# Patient Record
Sex: Male | Born: 1955
Health system: Southern US, Community
[De-identification: ages and names within clinical notes are randomized; demographics above are authoritative.]

## PROBLEM LIST (undated history)

## (undated) DIAGNOSIS — I10 Essential (primary) hypertension: Secondary | ICD-10-CM

## (undated) DIAGNOSIS — E7800 Pure hypercholesterolemia, unspecified: Secondary | ICD-10-CM

## (undated) DIAGNOSIS — I509 Heart failure, unspecified: Secondary | ICD-10-CM

## (undated) DIAGNOSIS — E789 Disorder of lipoprotein metabolism, unspecified: Secondary | ICD-10-CM

## (undated) DIAGNOSIS — I214 Non-ST elevation (NSTEMI) myocardial infarction: Secondary | ICD-10-CM

## (undated) DIAGNOSIS — I255 Ischemic cardiomyopathy: Secondary | ICD-10-CM

## (undated) DIAGNOSIS — R945 Abnormal results of liver function studies: Secondary | ICD-10-CM

## (undated) DIAGNOSIS — N189 Chronic kidney disease, unspecified: Secondary | ICD-10-CM

## (undated) DIAGNOSIS — Z951 Presence of aortocoronary bypass graft: Secondary | ICD-10-CM

## (undated) DIAGNOSIS — E1165 Type 2 diabetes mellitus with hyperglycemia: Secondary | ICD-10-CM

## (undated) DIAGNOSIS — Z9119 Patient's noncompliance with other medical treatment and regimen: Secondary | ICD-10-CM

## (undated) DIAGNOSIS — J449 Chronic obstructive pulmonary disease, unspecified: Secondary | ICD-10-CM

## (undated) DIAGNOSIS — I200 Unstable angina: Secondary | ICD-10-CM

## (undated) DIAGNOSIS — N200 Calculus of kidney: Secondary | ICD-10-CM

## (undated) DIAGNOSIS — I213 ST elevation (STEMI) myocardial infarction of unspecified site: Secondary | ICD-10-CM

## (undated) DIAGNOSIS — M109 Gout, unspecified: Secondary | ICD-10-CM

## (undated) DIAGNOSIS — I2510 Atherosclerotic heart disease of native coronary artery without angina pectoris: Secondary | ICD-10-CM

## (undated) HISTORY — DX: ST elevation (STEMI) myocardial infarction of unspecified site: I21.3

## (undated) HISTORY — PX: ROTATOR CUFF REPAIR: SHX139

## (undated) HISTORY — DX: Patient's noncompliance with other medical treatment and regimen: Z91.19

## (undated) HISTORY — PX: WRIST SURGERY: SHX841

## (undated) HISTORY — DX: Chronic obstructive pulmonary disease, unspecified: J44.9

## (undated) HISTORY — DX: Type 2 diabetes mellitus with hyperglycemia: E11.65

## (undated) HISTORY — PX: KNEE SURGERY: SHX244

---

## 2008-12-28 DIAGNOSIS — I2510 Atherosclerotic heart disease of native coronary artery without angina pectoris: Secondary | ICD-10-CM

## 2008-12-28 DIAGNOSIS — Z951 Presence of aortocoronary bypass graft: Secondary | ICD-10-CM

## 2008-12-28 DIAGNOSIS — I213 ST elevation (STEMI) myocardial infarction of unspecified site: Secondary | ICD-10-CM

## 2008-12-28 HISTORY — DX: Atherosclerotic heart disease of native coronary artery without angina pectoris: I25.10

## 2008-12-28 HISTORY — PX: CORONARY ARTERY BYPASS GRAFT: SHX141

## 2008-12-28 HISTORY — DX: ST elevation (STEMI) myocardial infarction of unspecified site: I21.3

## 2008-12-28 HISTORY — DX: Presence of aortocoronary bypass graft: Z95.1

## 2012-01-26 DIAGNOSIS — E789 Disorder of lipoprotein metabolism, unspecified: Secondary | ICD-10-CM

## 2012-01-26 DIAGNOSIS — I509 Heart failure, unspecified: Secondary | ICD-10-CM

## 2012-01-26 DIAGNOSIS — M109 Gout, unspecified: Secondary | ICD-10-CM

## 2012-01-26 DIAGNOSIS — Z87891 Personal history of nicotine dependence: Secondary | ICD-10-CM | POA: Insufficient documentation

## 2012-01-26 DIAGNOSIS — R7989 Other specified abnormal findings of blood chemistry: Secondary | ICD-10-CM

## 2012-01-26 DIAGNOSIS — I5023 Acute on chronic systolic (congestive) heart failure: Secondary | ICD-10-CM | POA: Insufficient documentation

## 2012-01-26 DIAGNOSIS — E1165 Type 2 diabetes mellitus with hyperglycemia: Secondary | ICD-10-CM

## 2012-01-26 DIAGNOSIS — IMO0002 Reserved for concepts with insufficient information to code with codable children: Secondary | ICD-10-CM

## 2012-01-26 DIAGNOSIS — K4090 Unilateral inguinal hernia, without obstruction or gangrene, not specified as recurrent: Secondary | ICD-10-CM | POA: Insufficient documentation

## 2012-01-26 HISTORY — DX: Type 2 diabetes mellitus with hyperglycemia: E11.65

## 2012-01-26 HISTORY — DX: Disorder of lipoprotein metabolism, unspecified: E78.9

## 2012-01-26 HISTORY — DX: Heart failure, unspecified: I50.9

## 2012-01-26 HISTORY — DX: Reserved for concepts with insufficient information to code with codable children: IMO0002

## 2012-01-26 HISTORY — DX: Other specified abnormal findings of blood chemistry: R79.89

## 2012-01-26 HISTORY — DX: Gout, unspecified: M10.9

## 2012-02-19 DIAGNOSIS — N189 Chronic kidney disease, unspecified: Secondary | ICD-10-CM

## 2012-02-19 HISTORY — DX: Chronic kidney disease, unspecified: N18.9

## 2012-06-22 DIAGNOSIS — G8929 Other chronic pain: Secondary | ICD-10-CM | POA: Insufficient documentation

## 2012-06-22 DIAGNOSIS — M2550 Pain in unspecified joint: Secondary | ICD-10-CM

## 2012-12-18 DIAGNOSIS — N486 Induration penis plastica: Secondary | ICD-10-CM | POA: Insufficient documentation

## 2013-01-24 DIAGNOSIS — N401 Enlarged prostate with lower urinary tract symptoms: Secondary | ICD-10-CM | POA: Insufficient documentation

## 2014-09-04 DIAGNOSIS — I10 Essential (primary) hypertension: Secondary | ICD-10-CM

## 2014-09-04 HISTORY — DX: Essential (primary) hypertension: I10

## 2014-11-14 DIAGNOSIS — I200 Unstable angina: Secondary | ICD-10-CM

## 2014-11-14 HISTORY — DX: Unstable angina: I20.0

## 2014-11-15 DIAGNOSIS — I255 Ischemic cardiomyopathy: Secondary | ICD-10-CM

## 2014-11-15 HISTORY — DX: Ischemic cardiomyopathy: I25.5

## 2015-01-09 DIAGNOSIS — I509 Heart failure, unspecified: Secondary | ICD-10-CM | POA: Diagnosis not present

## 2015-01-09 DIAGNOSIS — M5431 Sciatica, right side: Secondary | ICD-10-CM | POA: Diagnosis not present

## 2015-01-09 DIAGNOSIS — J45909 Unspecified asthma, uncomplicated: Secondary | ICD-10-CM | POA: Diagnosis not present

## 2015-01-31 DIAGNOSIS — J209 Acute bronchitis, unspecified: Secondary | ICD-10-CM | POA: Diagnosis not present

## 2015-01-31 DIAGNOSIS — F17210 Nicotine dependence, cigarettes, uncomplicated: Secondary | ICD-10-CM | POA: Diagnosis not present

## 2015-01-31 DIAGNOSIS — J40 Bronchitis, not specified as acute or chronic: Secondary | ICD-10-CM | POA: Diagnosis not present

## 2015-01-31 DIAGNOSIS — R05 Cough: Secondary | ICD-10-CM | POA: Diagnosis not present

## 2015-02-15 DIAGNOSIS — F17210 Nicotine dependence, cigarettes, uncomplicated: Secondary | ICD-10-CM | POA: Diagnosis not present

## 2015-02-15 DIAGNOSIS — I509 Heart failure, unspecified: Secondary | ICD-10-CM | POA: Diagnosis not present

## 2015-02-15 DIAGNOSIS — R0602 Shortness of breath: Secondary | ICD-10-CM | POA: Diagnosis not present

## 2015-02-15 DIAGNOSIS — R05 Cough: Secondary | ICD-10-CM | POA: Diagnosis not present

## 2015-02-15 DIAGNOSIS — R0981 Nasal congestion: Secondary | ICD-10-CM | POA: Diagnosis not present

## 2015-03-13 DIAGNOSIS — E119 Type 2 diabetes mellitus without complications: Secondary | ICD-10-CM | POA: Diagnosis not present

## 2015-03-13 DIAGNOSIS — J45909 Unspecified asthma, uncomplicated: Secondary | ICD-10-CM | POA: Diagnosis not present

## 2015-03-13 DIAGNOSIS — I2510 Atherosclerotic heart disease of native coronary artery without angina pectoris: Secondary | ICD-10-CM | POA: Diagnosis not present

## 2015-03-13 DIAGNOSIS — E785 Hyperlipidemia, unspecified: Secondary | ICD-10-CM | POA: Diagnosis not present

## 2015-05-02 DIAGNOSIS — R079 Chest pain, unspecified: Secondary | ICD-10-CM | POA: Diagnosis not present

## 2015-05-02 DIAGNOSIS — I309 Acute pericarditis, unspecified: Secondary | ICD-10-CM | POA: Diagnosis present

## 2015-05-02 DIAGNOSIS — I200 Unstable angina: Secondary | ICD-10-CM | POA: Diagnosis not present

## 2015-05-02 DIAGNOSIS — I213 ST elevation (STEMI) myocardial infarction of unspecified site: Secondary | ICD-10-CM | POA: Diagnosis not present

## 2015-05-02 DIAGNOSIS — R931 Abnormal findings on diagnostic imaging of heart and coronary circulation: Secondary | ICD-10-CM | POA: Diagnosis not present

## 2015-05-02 DIAGNOSIS — E119 Type 2 diabetes mellitus without complications: Secondary | ICD-10-CM | POA: Diagnosis present

## 2015-05-02 DIAGNOSIS — M1000 Idiopathic gout, unspecified site: Secondary | ICD-10-CM | POA: Diagnosis not present

## 2015-05-02 DIAGNOSIS — R918 Other nonspecific abnormal finding of lung field: Secondary | ICD-10-CM | POA: Diagnosis not present

## 2015-05-02 DIAGNOSIS — M10022 Idiopathic gout, left elbow: Secondary | ICD-10-CM | POA: Diagnosis present

## 2015-05-02 DIAGNOSIS — F17210 Nicotine dependence, cigarettes, uncomplicated: Secondary | ICD-10-CM | POA: Diagnosis not present

## 2015-05-02 DIAGNOSIS — I129 Hypertensive chronic kidney disease with stage 1 through stage 4 chronic kidney disease, or unspecified chronic kidney disease: Secondary | ICD-10-CM | POA: Diagnosis present

## 2015-05-02 DIAGNOSIS — I2510 Atherosclerotic heart disease of native coronary artery without angina pectoris: Secondary | ICD-10-CM | POA: Diagnosis present

## 2015-05-02 DIAGNOSIS — I10 Essential (primary) hypertension: Secondary | ICD-10-CM | POA: Diagnosis not present

## 2015-05-02 DIAGNOSIS — R072 Precordial pain: Secondary | ICD-10-CM | POA: Diagnosis not present

## 2015-05-02 DIAGNOSIS — M79602 Pain in left arm: Secondary | ICD-10-CM | POA: Diagnosis not present

## 2015-05-02 DIAGNOSIS — R9431 Abnormal electrocardiogram [ECG] [EKG]: Secondary | ICD-10-CM | POA: Diagnosis not present

## 2015-05-02 DIAGNOSIS — I25110 Atherosclerotic heart disease of native coronary artery with unstable angina pectoris: Secondary | ICD-10-CM | POA: Diagnosis not present

## 2015-05-02 DIAGNOSIS — E785 Hyperlipidemia, unspecified: Secondary | ICD-10-CM | POA: Diagnosis present

## 2015-05-02 DIAGNOSIS — I255 Ischemic cardiomyopathy: Secondary | ICD-10-CM | POA: Diagnosis not present

## 2015-05-02 DIAGNOSIS — N183 Chronic kidney disease, stage 3 (moderate): Secondary | ICD-10-CM | POA: Diagnosis present

## 2015-05-02 DIAGNOSIS — Z720 Tobacco use: Secondary | ICD-10-CM | POA: Diagnosis not present

## 2015-05-02 DIAGNOSIS — Z951 Presence of aortocoronary bypass graft: Secondary | ICD-10-CM | POA: Diagnosis not present

## 2015-05-05 DIAGNOSIS — I309 Acute pericarditis, unspecified: Secondary | ICD-10-CM | POA: Insufficient documentation

## 2015-05-20 DIAGNOSIS — M2550 Pain in unspecified joint: Secondary | ICD-10-CM | POA: Diagnosis not present

## 2015-05-20 DIAGNOSIS — I309 Acute pericarditis, unspecified: Secondary | ICD-10-CM | POA: Diagnosis not present

## 2015-05-20 DIAGNOSIS — M1A00X0 Idiopathic chronic gout, unspecified site, without tophus (tophi): Secondary | ICD-10-CM | POA: Diagnosis not present

## 2015-05-20 DIAGNOSIS — I10 Essential (primary) hypertension: Secondary | ICD-10-CM | POA: Diagnosis not present

## 2015-07-04 ENCOUNTER — Emergency Department (HOSPITAL_BASED_OUTPATIENT_CLINIC_OR_DEPARTMENT_OTHER)
Admission: EM | Admit: 2015-07-04 | Discharge: 2015-07-04 | Disposition: A | Discharge status: Home / Self Care | Payer: Medicare Other | Attending: Emergency Medicine | Admitting: Emergency Medicine

## 2015-07-04 ENCOUNTER — Emergency Department (HOSPITAL_BASED_OUTPATIENT_CLINIC_OR_DEPARTMENT_OTHER): Payer: Medicare Other

## 2015-07-04 ENCOUNTER — Encounter (HOSPITAL_BASED_OUTPATIENT_CLINIC_OR_DEPARTMENT_OTHER): Payer: Self-pay

## 2015-07-04 DIAGNOSIS — E119 Type 2 diabetes mellitus without complications: Secondary | ICD-10-CM | POA: Diagnosis not present

## 2015-07-04 DIAGNOSIS — IMO0001 Reserved for inherently not codable concepts without codable children: Secondary | ICD-10-CM

## 2015-07-04 DIAGNOSIS — Z87891 Personal history of nicotine dependence: Secondary | ICD-10-CM | POA: Diagnosis not present

## 2015-07-04 DIAGNOSIS — Z8709 Personal history of other diseases of the respiratory system: Secondary | ICD-10-CM | POA: Diagnosis not present

## 2015-07-04 DIAGNOSIS — I10 Essential (primary) hypertension: Secondary | ICD-10-CM | POA: Diagnosis not present

## 2015-07-04 DIAGNOSIS — M7989 Other specified soft tissue disorders: Secondary | ICD-10-CM | POA: Diagnosis not present

## 2015-07-04 DIAGNOSIS — Z7982 Long term (current) use of aspirin: Secondary | ICD-10-CM | POA: Insufficient documentation

## 2015-07-04 DIAGNOSIS — I252 Old myocardial infarction: Secondary | ICD-10-CM | POA: Insufficient documentation

## 2015-07-04 DIAGNOSIS — Z79899 Other long term (current) drug therapy: Secondary | ICD-10-CM | POA: Insufficient documentation

## 2015-07-04 DIAGNOSIS — Z791 Long term (current) use of non-steroidal anti-inflammatories (NSAID): Secondary | ICD-10-CM | POA: Diagnosis not present

## 2015-07-04 HISTORY — DX: Essential (primary) hypertension: I10

## 2015-07-04 HISTORY — DX: Gout, unspecified: M10.9

## 2015-07-04 MED ORDER — OXYCODONE-ACETAMINOPHEN 5-325 MG PO TABS
1.0000 | ORAL_TABLET | Freq: Once | ORAL | Status: AC
Start: 2015-07-04 — End: 2015-07-04
  Administered 2015-07-04: 1 via ORAL
  Filled 2015-07-04: qty 1

## 2015-07-04 MED ORDER — HYDROCODONE-ACETAMINOPHEN 5-325 MG PO TABS: 1.0000 | ORAL_TABLET | ORAL | Status: DC | PRN

## 2015-07-04 MED ORDER — INDOMETHACIN 25 MG PO CAPS: 25.0000 mg | ORAL_CAPSULE | Freq: Three times a day (TID) | ORAL | Status: DC | PRN

## 2015-07-04 NOTE — ED Notes (Signed)
Staff in room attempting ring removal with electric ring cutter.

## 2015-07-04 NOTE — ED Notes (Signed)
Pt with right middle finger swelling, ring noted to be on same.  Reports thinks it is gout b/c hx of same.

## 2015-07-04 NOTE — ED Provider Notes (Signed)
CSN: [id number]     Arrival date & time 07/04/15  1955 History   This chart was scribed for Sherwood Gambler, MD by Hilda Lias, ED Scribe. This patient was seen in room MH09/MH09 and the patient's care was started at 8:47 PM.  Chief Complaint  Patient presents with  . Joint Swelling      The history is provided by the patient. No language interpreter was used.   HPI Comments: Patrick Stewart is a 59 y.o. male with a hx of gout who presents to the Emergency Department complaining of constant 10/10 right middle finger pain with associated joint swelling that has been present for two days. Pt states it began to swell two days ago and notes that once it started to swell that he could not get his ring off of his finger. Pt states he has had gout in his fingers but not often.    Past Medical History  Diagnosis Date  . Gout   . Hypertension   . Diabetes mellitus without complication   . MI (myocardial infarction)   . Chronic bronchitis    Past Surgical History  Procedure Laterality Date  . Coronary artery bypass graft    . Knee surgery    . Rotator cuff repair    . Wrist surgery     No family history on file. History  Substance Use Topics  . Smoking status: Former Research scientist (life sciences)  . Smokeless tobacco: Not on file  . Alcohol Use: No    Review of Systems  Constitutional: Negative for fever.  Musculoskeletal: Positive for joint swelling and arthralgias.  Skin: Negative for color change.  All other systems reviewed and are negative.     Allergies  Review of patient's allergies indicates no known allergies.  Home Medications   Prior to Admission medications   Medication Sig Start Date End Date Taking? Authorizing Provider  albuterol (PROVENTIL) (2.5 MG/3ML) 0.083% nebulizer solution Take 2.5 mg by nebulization every 6 (six) hours as needed for wheezing or shortness of breath.   Yes Historical Provider, MD  aspirin 325 MG tablet Take 325 mg by mouth daily.   Yes Historical Provider, MD   atorvastatin (LIPITOR) 40 MG tablet Take 40 mg by mouth daily.   Yes Historical Provider, MD  doxazosin (CARDURA) 1 MG tablet Take 1 mg by mouth daily.   Yes Historical Provider, MD  lisinopril (PRINIVIL,ZESTRIL) 10 MG tablet Take 10 mg by mouth daily.   Yes Historical Provider, MD  metFORMIN (GLUCOPHAGE) 1000 MG tablet Take 1,000 mg by mouth 2 (two) times daily with a meal.   Yes Historical Provider, MD  naproxen (NAPROSYN) 500 MG tablet Take 500 mg by mouth 2 (two) times daily with a meal.   Yes Historical Provider, MD   BP 135/80 mmHg  Pulse 71  Temp(Src) 98.3 F (36.8 C) (Oral)  Resp 18  Ht 5\' 10"  (1.778 m)  Wt 192 lb (87.091 kg)  BMI 27.55 kg/m2  SpO2 94% Physical Exam  Constitutional: He is oriented to person, place, and time. He appears well-developed and well-nourished.  HENT:  Head: Normocephalic and atraumatic.  Right Ear: External ear normal.  Left Ear: External ear normal.  Nose: Nose normal.  Eyes: Right eye exhibits no discharge. Left eye exhibits no discharge.  Neck: Neck supple.  Pulmonary/Chest: Effort normal.  Abdominal: He exhibits no distension.  Musculoskeletal: He exhibits no edema.  Right middle finger PIP with diffuse swelling and decreased ROM No redness; mild warmth Normal capillary refill  Neurological: He is alert and oriented to person, place, and time.  Skin: Skin is warm and dry. No erythema.  Nursing note and vitals reviewed.   ED Course  Procedures (including critical care time)  DIAGNOSTIC STUDIES: Oxygen Saturation is 94% on room air, normal by my interpretation.    COORDINATION OF CARE: 8:48 PM Discussed treatment plan with pt at bedside and pt agreed to plan.   Labs Review Labs Reviewed - No data to display  Imaging Review Dg Finger Middle Right  07/04/2015   CLINICAL DATA:  Soft tissue swelling for several days  EXAM: RIGHT THIRD FINGER 2+V  COMPARISON:  None.  FINDINGS: Frontal, oblique, and lateral views were obtained. There  is focal soft tissue swelling in the region of the third PIP joint. There is no fracture or dislocation. Joint spaces appear intact. No erosive change or periostitis. No radiopaque foreign body.  IMPRESSION: Soft tissue swelling at the level of the third PIP joint. No bony or joint space abnormality appreciable.   Electronically Signed   By: Lowella Grip III M.D.   On: 07/04/2015 21:59     EKG Interpretation None      MDM   Final diagnoses:  Swelling of third finger of right hand    Patient with significant PIP swelling, previous extensive history of gout and he feels this is same. Joint is swollen but no redness. Mildly limited ROM but I feel this is more likely gout, unlikely to be septic arthritis. Patient's ring proximal to this was unable to be removed manually and patient chose to have tech cut it off. Will treat pain, he requests indomethacin which has worked many times in the past. Recommend close f/u with PCP  I personally performed the services described in this documentation, which was scribed in my presence. The recorded information has been reviewed and is accurate.    Sherwood Gambler, MD 07/05/15 [phone]

## 2015-07-13 ENCOUNTER — Encounter (HOSPITAL_BASED_OUTPATIENT_CLINIC_OR_DEPARTMENT_OTHER): Payer: Self-pay

## 2015-07-13 ENCOUNTER — Emergency Department (HOSPITAL_BASED_OUTPATIENT_CLINIC_OR_DEPARTMENT_OTHER): Payer: Medicare Other

## 2015-07-13 ENCOUNTER — Emergency Department (HOSPITAL_BASED_OUTPATIENT_CLINIC_OR_DEPARTMENT_OTHER)
Admission: EM | Admit: 2015-07-13 | Discharge: 2015-07-13 | Disposition: A | Discharge status: Home / Self Care | Payer: Medicare Other | Attending: Emergency Medicine | Admitting: Emergency Medicine

## 2015-07-13 DIAGNOSIS — I252 Old myocardial infarction: Secondary | ICD-10-CM | POA: Insufficient documentation

## 2015-07-13 DIAGNOSIS — Z8739 Personal history of other diseases of the musculoskeletal system and connective tissue: Secondary | ICD-10-CM | POA: Insufficient documentation

## 2015-07-13 DIAGNOSIS — Z87891 Personal history of nicotine dependence: Secondary | ICD-10-CM | POA: Diagnosis not present

## 2015-07-13 DIAGNOSIS — Z791 Long term (current) use of non-steroidal anti-inflammatories (NSAID): Secondary | ICD-10-CM | POA: Diagnosis not present

## 2015-07-13 DIAGNOSIS — J189 Pneumonia, unspecified organism: Secondary | ICD-10-CM

## 2015-07-13 DIAGNOSIS — E119 Type 2 diabetes mellitus without complications: Secondary | ICD-10-CM | POA: Diagnosis not present

## 2015-07-13 DIAGNOSIS — R079 Chest pain, unspecified: Secondary | ICD-10-CM | POA: Diagnosis not present

## 2015-07-13 DIAGNOSIS — I10 Essential (primary) hypertension: Secondary | ICD-10-CM | POA: Diagnosis not present

## 2015-07-13 DIAGNOSIS — J159 Unspecified bacterial pneumonia: Secondary | ICD-10-CM | POA: Insufficient documentation

## 2015-07-13 DIAGNOSIS — R05 Cough: Secondary | ICD-10-CM | POA: Diagnosis present

## 2015-07-13 DIAGNOSIS — Z7982 Long term (current) use of aspirin: Secondary | ICD-10-CM | POA: Diagnosis not present

## 2015-07-13 DIAGNOSIS — J40 Bronchitis, not specified as acute or chronic: Secondary | ICD-10-CM | POA: Diagnosis not present

## 2015-07-13 DIAGNOSIS — Z951 Presence of aortocoronary bypass graft: Secondary | ICD-10-CM | POA: Diagnosis not present

## 2015-07-13 DIAGNOSIS — Z79899 Other long term (current) drug therapy: Secondary | ICD-10-CM | POA: Insufficient documentation

## 2015-07-13 LAB — CBC WITH DIFFERENTIAL/PLATELET
Basophils Absolute: 0 10*3/uL (ref 0.0–0.1)
Basophils Relative: 1 % (ref 0–1)
EOS PCT: 6 % — AB (ref 0–5)
Eosinophils Absolute: 0.2 10*3/uL (ref 0.0–0.7)
HCT: 35.4 % — ABNORMAL LOW (ref 39.0–52.0)
HEMOGLOBIN: 11.4 g/dL — AB (ref 13.0–17.0)
LYMPHS PCT: 23 % (ref 12–46)
Lymphs Abs: 0.9 10*3/uL (ref 0.7–4.0)
MCH: 30.6 pg (ref 26.0–34.0)
MCHC: 32.2 g/dL (ref 30.0–36.0)
MCV: 95.2 fL (ref 78.0–100.0)
Monocytes Absolute: 0.7 10*3/uL (ref 0.1–1.0)
Monocytes Relative: 17 % — ABNORMAL HIGH (ref 3–12)
NEUTROS PCT: 55 % (ref 43–77)
Neutro Abs: 2.2 10*3/uL (ref 1.7–7.7)
PLATELETS: 156 10*3/uL (ref 150–400)
RBC: 3.72 MIL/uL — AB (ref 4.22–5.81)
RDW: 14.3 % (ref 11.5–15.5)
WBC: 4 10*3/uL (ref 4.0–10.5)

## 2015-07-13 LAB — BASIC METABOLIC PANEL
Anion gap: 8 (ref 5–15)
BUN: 21 mg/dL — ABNORMAL HIGH (ref 6–20)
CO2: 25 mmol/L (ref 22–32)
Calcium: 8.6 mg/dL — ABNORMAL LOW (ref 8.9–10.3)
Chloride: 106 mmol/L (ref 101–111)
Creatinine, Ser: 1.39 mg/dL — ABNORMAL HIGH (ref 0.61–1.24)
GFR calc Af Amer: >60 mL/min (ref >60)
GFR calc non Af Amer: 54 mL/min — ABNORMAL LOW (ref >60)
Glucose, Bld: 123 mg/dL — ABNORMAL HIGH (ref 65–99)
POTASSIUM: 4.1 mmol/L (ref 3.5–5.1)
SODIUM: 139 mmol/L (ref 135–145)

## 2015-07-13 LAB — CBG MONITORING, ED: Glucose-Capillary: 156 mg/dL — ABNORMAL HIGH (ref 65–99)

## 2015-07-13 MED ORDER — SODIUM CHLORIDE 0.9 % IV BOLUS (SEPSIS)
500.0000 mL | Freq: Once | INTRAVENOUS | Status: AC
  Administered 2015-07-13: 500 mL via INTRAVENOUS

## 2015-07-13 MED ORDER — IPRATROPIUM-ALBUTEROL 0.5-2.5 (3) MG/3ML IN SOLN
3.0000 mL | Freq: Four times a day (QID) | RESPIRATORY_TRACT | Status: DC
  Administered 2015-07-13: 3 mL via RESPIRATORY_TRACT
  Filled 2015-07-13: qty 3

## 2015-07-13 MED ORDER — ALBUTEROL SULFATE HFA 108 (90 BASE) MCG/ACT IN AERS
1.0000 | INHALATION_SPRAY | Freq: Four times a day (QID) | RESPIRATORY_TRACT | Status: DC | PRN
Start: 2015-07-13 — End: 2016-01-29

## 2015-07-13 MED ORDER — AZITHROMYCIN 250 MG PO TABS: 250.0000 mg | ORAL_TABLET | Freq: Every day | ORAL | Status: DC

## 2015-07-13 MED ORDER — DM-GUAIFENESIN ER 30-600 MG PO TB12: 1.0000 | ORAL_TABLET | Freq: Two times a day (BID) | ORAL | Status: DC

## 2015-07-13 MED ORDER — CEFTRIAXONE SODIUM 1 G IJ SOLR
INTRAMUSCULAR | Status: AC
  Filled 2015-07-13: qty 10

## 2015-07-13 MED ORDER — DEXTROSE 5 % IV SOLN
1.0000 g | Freq: Once | INTRAVENOUS | Status: AC
  Administered 2015-07-13: 1 g via INTRAVENOUS

## 2015-07-13 MED ORDER — SODIUM CHLORIDE 0.9 % IV SOLN: INTRAVENOUS | Status: DC

## 2015-07-13 NOTE — ED Notes (Signed)
Patient here with dry cough and congestion x 1 day

## 2015-07-13 NOTE — ED Provider Notes (Signed)
CSN: [id number]     Arrival date & time 07/13/15  1028 History   First MD Initiated Contact with Patient 07/13/15 1042     Chief Complaint  Patient presents with  . Cough     (Consider location/radiation/quality/duration/timing/severity/associated sxs/prior Treatment) Patient is a 59 y.o. male presenting with cough. The history is provided by the patient.  Cough Associated symptoms: chest pain and shortness of breath   Associated symptoms: no fever, no headaches and no rash    patient presents with a nonproductive cough that started yesterday. Associated with congestion no true wheezing. Patient does have albuterol inhaler that has been using at home. Patient states that he gets bronchitis frequently. Patient has chest soreness due to all the coughing chest pain is only with the cough. No fevers. No nausea vomiting or diarrhea.  Past Medical History  Diagnosis Date  . Gout   . Hypertension   . Diabetes mellitus without complication   . MI (myocardial infarction)   . Chronic bronchitis    Past Surgical History  Procedure Laterality Date  . Coronary artery bypass graft    . Knee surgery    . Rotator cuff repair    . Wrist surgery     No family history on file. History  Substance Use Topics  . Smoking status: Former Research scientist (life sciences)  . Smokeless tobacco: Not on file  . Alcohol Use: No    Review of Systems  Constitutional: Negative for fever.  HENT: Positive for congestion.   Eyes: Negative for redness.  Respiratory: Positive for cough and shortness of breath.   Cardiovascular: Positive for chest pain.  Gastrointestinal: Negative for nausea, vomiting and abdominal pain.  Genitourinary: Negative for hematuria.  Musculoskeletal: Negative for back pain.  Skin: Negative for rash.  Neurological: Negative for headaches.  Hematological: Does not bruise/bleed easily.  Psychiatric/Behavioral: Negative for confusion.      Allergies  Review of patient's allergies indicates no known  allergies.  Home Medications   Prior to Admission medications   Medication Sig Start Date End Date Taking? Authorizing Provider  albuterol (PROVENTIL HFA;VENTOLIN HFA) 108 (90 BASE) MCG/ACT inhaler Inhale 1-2 puffs into the lungs every 6 (six) hours as needed for wheezing or shortness of breath. 07/13/15   [name], MD  albuterol (PROVENTIL) (2.5 MG/3ML) 0.083% nebulizer solution Take 2.5 mg by nebulization every 6 (six) hours as needed for wheezing or shortness of breath.    Historical Provider, MD  aspirin 325 MG tablet Take 325 mg by mouth daily.    Historical Provider, MD  atorvastatin (LIPITOR) 40 MG tablet Take 40 mg by mouth daily.    Historical Provider, MD  azithromycin (ZITHROMAX) 250 MG tablet Take 1 tablet (250 mg total) by mouth daily. Take first 2 tablets together, then 1 every day until finished. 07/13/15   [name], MD  dextromethorphan-guaiFENesin [facility] DM) 30-600 MG per 12 hr tablet Take 1 tablet by mouth 2 (two) times daily. 07/13/15   [name], MD  doxazosin (CARDURA) 1 MG tablet Take 1 mg by mouth daily.    Historical Provider, MD  HYDROcodone-acetaminophen (NORCO) 5-325 MG per tablet Take 1-2 tablets by mouth every 4 (four) hours as needed for severe pain. 07/04/15   Sherwood Gambler, MD  indomethacin (INDOCIN) 25 MG capsule Take 1 capsule (25 mg total) by mouth 3 (three) times daily as needed. 07/04/15   Sherwood Gambler, MD  lisinopril (PRINIVIL,ZESTRIL) 10 MG tablet Take 10 mg by mouth daily.    Historical Provider,  MD  metFORMIN (GLUCOPHAGE) 1000 MG tablet Take 1,000 mg by mouth 2 (two) times daily with a meal.    Historical Provider, MD  naproxen (NAPROSYN) 500 MG tablet Take 500 mg by mouth 2 (two) times daily with a meal.    Historical Provider, MD   BP 181/98 mmHg  Pulse 80  Temp(Src) 98.2 F (36.8 C) (Oral)  Resp 18  Ht 5\' 10"  (1.778 m)  Wt 192 lb (87.091 kg)  BMI 27.55 kg/m2  SpO2 99% Physical Exam  Constitutional: He is oriented to person,  place, and time. He appears well-developed and well-nourished. No distress.  HENT:  Head: Normocephalic and atraumatic.  Mouth/Throat: Oropharynx is clear and moist.  Eyes: Conjunctivae and EOM are normal. Pupils are equal, round, and reactive to light.  Neck: Normal range of motion.  Cardiovascular: Normal rate, regular rhythm and normal heart sounds.   Pulmonary/Chest: Effort normal and breath sounds normal. No respiratory distress. He has no wheezes.  Abdominal: Soft. Bowel sounds are normal. There is no tenderness.  Musculoskeletal: Normal range of motion. He exhibits no edema.  Neurological: He is alert and oriented to person, place, and time. No cranial nerve deficit. He exhibits normal muscle tone. Coordination normal.  Skin: Skin is warm. No rash noted.    ED Course  Procedures (including critical care time) Labs Review Labs Reviewed  CBC WITH DIFFERENTIAL/PLATELET - Abnormal; Notable for the following:    RBC 3.72 (*)    Hemoglobin 11.4 (*)    HCT 35.4 (*)    Monocytes Relative 17 (*)    Eosinophils Relative 6 (*)    All other components within normal limits  BASIC METABOLIC PANEL - Abnormal; Notable for the following:    Glucose, Bld 123 (*)    BUN 21 (*)    Creatinine, Ser 1.39 (*)    Calcium 8.6 (*)    GFR calc non Af Amer 54 (*)    All other components within normal limits  CBG MONITORING, ED - Abnormal; Notable for the following:    Glucose-Capillary 156 (*)    All other components within normal limits   Results for orders placed or performed during the hospital encounter of 07/13/15  CBC with Differential/Platelet  Result Value Ref Range   WBC 4.0 4.0 - 10.5 K/uL   RBC 3.72 (L) 4.22 - 5.81 MIL/uL   Hemoglobin 11.4 (L) 13.0 - 17.0 g/dL   HCT 35.4 (L) 39.0 - 52.0 %   MCV 95.2 78.0 - 100.0 fL   MCH 30.6 26.0 - 34.0 pg   MCHC 32.2 30.0 - 36.0 g/dL   RDW 14.3 11.5 - 15.5 %   Platelets 156 150 - 400 K/uL   Neutrophils Relative % 55 43 - 77 %   Neutro Abs 2.2  1.7 - 7.7 K/uL   Lymphocytes Relative 23 12 - 46 %   Lymphs Abs 0.9 0.7 - 4.0 K/uL   Monocytes Relative 17 (H) 3 - 12 %   Monocytes Absolute 0.7 0.1 - 1.0 K/uL   Eosinophils Relative 6 (H) 0 - 5 %   Eosinophils Absolute 0.2 0.0 - 0.7 K/uL   Basophils Relative 1 0 - 1 %   Basophils Absolute 0.0 0.0 - 0.1 K/uL  Basic metabolic panel  Result Value Ref Range   Sodium 139 135 - 145 mmol/L   Potassium 4.1 3.5 - 5.1 mmol/L   Chloride 106 101 - 111 mmol/L   CO2 25 22 - 32 mmol/L  Glucose, Bld 123 (H) 65 - 99 mg/dL   BUN 21 (H) 6 - 20 mg/dL   Creatinine, Ser 1.39 (H) 0.61 - 1.24 mg/dL   Calcium 8.6 (L) 8.9 - 10.3 mg/dL   GFR calc non Af Amer 54 (L) >60 mL/min   GFR calc Af Amer >60 >60 mL/min   Anion gap 8 5 - 15  CBG monitoring, ED  Result Value Ref Range   Glucose-Capillary 156 (H) 65 - 99 mg/dL     Imaging Review Dg Chest 2 View  07/13/2015   CLINICAL DATA:  Cough, congestion, cold symptoms  EXAM: CHEST  2 VIEW  COMPARISON:  None.  FINDINGS: Borderline cardiomegaly. Status post CABG. Streaky mild interstitial prominence right perihilar and right infrahilar region. Bronchitic changes or atypical pneumonitis cannot be excluded. No pulmonary edema.  IMPRESSION: Streaky mild interstitial prominence right perihilar and right infrahilar region. Bronchitic changes or atypical pneumonitis cannot be excluded. No pulmonary edema.   Electronically Signed   By: Lahoma Crocker M.D.   On: 07/13/2015 11:12     EKG Interpretation None      MDM   Final diagnoses:  CAP (community acquired pneumonia)  Bronchitis    Chest x-ray suggestive of early pneumonia. Patient's symptoms more consistent with just straight bronchitis. No leukocytosis. However will treat for community-acquired pneumonia. Patient was hospitalized at [facility] in the beginning of April so that would've been more than 90 days ago. Also treated with albuterol inhaler although no wheezing here and Mucinex DM. Nontoxic no acute distress. No  hypoxia.    [name], MD 07/13/15 [phone]

## 2015-07-13 NOTE — Discharge Instructions (Signed)
Chest x-ray suggestive of early pneumonia. Take anabolic as directed. Use albuterol inhaler of the 1 you have at home 2 puffs every 6 hours for the next 7 days. Take Mucinex DM as needed for cough. Return for any new or worse symptoms or if not improving in the 2 days.

## 2015-07-17 ENCOUNTER — Emergency Department (HOSPITAL_COMMUNITY)
Admission: EM | Admit: 2015-07-17 | Discharge: 2015-07-17 | Disposition: A | Discharge status: Home / Self Care | Payer: Medicare Other | Attending: Emergency Medicine | Admitting: Emergency Medicine

## 2015-07-17 ENCOUNTER — Emergency Department (HOSPITAL_COMMUNITY): Payer: Medicare Other

## 2015-07-17 ENCOUNTER — Encounter (HOSPITAL_COMMUNITY): Payer: Self-pay | Admitting: Emergency Medicine

## 2015-07-17 DIAGNOSIS — Z951 Presence of aortocoronary bypass graft: Secondary | ICD-10-CM | POA: Insufficient documentation

## 2015-07-17 DIAGNOSIS — I10 Essential (primary) hypertension: Secondary | ICD-10-CM | POA: Diagnosis not present

## 2015-07-17 DIAGNOSIS — Z7982 Long term (current) use of aspirin: Secondary | ICD-10-CM | POA: Diagnosis not present

## 2015-07-17 DIAGNOSIS — M109 Gout, unspecified: Secondary | ICD-10-CM | POA: Diagnosis not present

## 2015-07-17 DIAGNOSIS — J40 Bronchitis, not specified as acute or chronic: Secondary | ICD-10-CM | POA: Diagnosis not present

## 2015-07-17 DIAGNOSIS — I252 Old myocardial infarction: Secondary | ICD-10-CM | POA: Diagnosis not present

## 2015-07-17 DIAGNOSIS — Z87891 Personal history of nicotine dependence: Secondary | ICD-10-CM | POA: Insufficient documentation

## 2015-07-17 DIAGNOSIS — R05 Cough: Secondary | ICD-10-CM | POA: Diagnosis not present

## 2015-07-17 DIAGNOSIS — Z791 Long term (current) use of non-steroidal anti-inflammatories (NSAID): Secondary | ICD-10-CM | POA: Insufficient documentation

## 2015-07-17 DIAGNOSIS — J209 Acute bronchitis, unspecified: Secondary | ICD-10-CM | POA: Diagnosis not present

## 2015-07-17 DIAGNOSIS — R509 Fever, unspecified: Secondary | ICD-10-CM | POA: Diagnosis not present

## 2015-07-17 DIAGNOSIS — Z79899 Other long term (current) drug therapy: Secondary | ICD-10-CM | POA: Insufficient documentation

## 2015-07-17 DIAGNOSIS — E119 Type 2 diabetes mellitus without complications: Secondary | ICD-10-CM | POA: Diagnosis not present

## 2015-07-17 LAB — COMPREHENSIVE METABOLIC PANEL
ALK PHOS: 50 U/L (ref 38–126)
ALT: 28 U/L (ref 17–63)
AST: 65 U/L — AB (ref 15–41)
Albumin: 3.6 g/dL (ref 3.5–5.0)
Anion gap: 11 (ref 5–15)
BUN: 26 mg/dL — ABNORMAL HIGH (ref 6–20)
CO2: 22 mmol/L (ref 22–32)
CREATININE: 1.58 mg/dL — AB (ref 0.61–1.24)
Calcium: 8.4 mg/dL — ABNORMAL LOW (ref 8.9–10.3)
Chloride: 101 mmol/L (ref 101–111)
GFR calc non Af Amer: 46 mL/min — ABNORMAL LOW (ref >60)
GFR, EST AFRICAN AMERICAN: 54 mL/min — AB (ref >60)
Glucose, Bld: 105 mg/dL — ABNORMAL HIGH (ref 65–99)
Potassium: 3.9 mmol/L (ref 3.5–5.1)
SODIUM: 134 mmol/L — AB (ref 135–145)
TOTAL PROTEIN: 8.3 g/dL — AB (ref 6.5–8.1)
Total Bilirubin: 1 mg/dL (ref 0.3–1.2)

## 2015-07-17 LAB — URINE MICROSCOPIC-ADD ON

## 2015-07-17 LAB — I-STAT CHEM 8, ED
BUN: 29 mg/dL — AB (ref 6–20)
CALCIUM ION: 1.07 mmol/L — AB (ref 1.12–1.23)
Chloride: 101 mmol/L (ref 101–111)
Creatinine, Ser: 1.6 mg/dL — ABNORMAL HIGH (ref 0.61–1.24)
Glucose, Bld: 109 mg/dL — ABNORMAL HIGH (ref 65–99)
HEMATOCRIT: 37 % — AB (ref 39.0–52.0)
HEMOGLOBIN: 12.6 g/dL — AB (ref 13.0–17.0)
POTASSIUM: 3.8 mmol/L (ref 3.5–5.1)
Sodium: 136 mmol/L (ref 135–145)
TCO2: 22 mmol/L (ref 0–100)

## 2015-07-17 LAB — CBC WITH DIFFERENTIAL/PLATELET
BASOS PCT: 1 % (ref 0–1)
Basophils Absolute: 0 10*3/uL (ref 0.0–0.1)
EOS ABS: 0.1 10*3/uL (ref 0.0–0.7)
EOS PCT: 2 % (ref 0–5)
HEMATOCRIT: 33.8 % — AB (ref 39.0–52.0)
HEMOGLOBIN: 11 g/dL — AB (ref 13.0–17.0)
Lymphocytes Relative: 23 % (ref 12–46)
Lymphs Abs: 1 10*3/uL (ref 0.7–4.0)
MCH: 30.4 pg (ref 26.0–34.0)
MCHC: 32.5 g/dL (ref 30.0–36.0)
MCV: 93.4 fL (ref 78.0–100.0)
MONO ABS: 0.9 10*3/uL (ref 0.1–1.0)
MONOS PCT: 21 % — AB (ref 3–12)
Neutro Abs: 2.3 10*3/uL (ref 1.7–7.7)
Neutrophils Relative %: 53 % (ref 43–77)
Platelets: 171 10*3/uL (ref 150–400)
RBC: 3.62 MIL/uL — ABNORMAL LOW (ref 4.22–5.81)
RDW: 14.3 % (ref 11.5–15.5)
WBC: 4.3 10*3/uL (ref 4.0–10.5)

## 2015-07-17 LAB — URINALYSIS, ROUTINE W REFLEX MICROSCOPIC
Bilirubin Urine: NEGATIVE
Glucose, UA: NEGATIVE mg/dL
KETONES UR: NEGATIVE mg/dL
Leukocytes, UA: NEGATIVE
Nitrite: NEGATIVE
Protein, ur: >300 mg/dL — AB
Specific Gravity, Urine: 1.024 (ref 1.005–1.030)
Urobilinogen, UA: 1 mg/dL (ref 0.0–1.0)
pH: 5.5 (ref 5.0–8.0)

## 2015-07-17 LAB — I-STAT CG4 LACTIC ACID, ED: LACTIC ACID, VENOUS: 1.26 mmol/L (ref 0.5–2.0)

## 2015-07-17 MED ORDER — BENZONATATE 100 MG PO CAPS: 100.0000 mg | ORAL_CAPSULE | Freq: Three times a day (TID) | ORAL | Status: DC

## 2015-07-17 MED ORDER — ALBUTEROL SULFATE (2.5 MG/3ML) 0.083% IN NEBU
5.0000 mg | INHALATION_SOLUTION | Freq: Once | RESPIRATORY_TRACT | Status: AC
  Administered 2015-07-17: 5 mg via RESPIRATORY_TRACT
  Filled 2015-07-17: qty 6

## 2015-07-17 MED ORDER — AMOXICILLIN-POT CLAVULANATE 875-125 MG PO TABS
1.0000 | ORAL_TABLET | Freq: Once | ORAL | Status: AC
  Administered 2015-07-17: 1 via ORAL
  Filled 2015-07-17: qty 1

## 2015-07-17 MED ORDER — ALBUTEROL SULFATE HFA 108 (90 BASE) MCG/ACT IN AERS: 1.0000 | INHALATION_SPRAY | Freq: Four times a day (QID) | RESPIRATORY_TRACT | Status: DC | PRN

## 2015-07-17 MED ORDER — PREDNISONE 20 MG PO TABS: ORAL_TABLET | ORAL | Status: DC

## 2015-07-17 MED ORDER — NAPROXEN 500 MG PO TABS: 500.0000 mg | ORAL_TABLET | Freq: Two times a day (BID) | ORAL | Status: DC

## 2015-07-17 MED ORDER — ACETAMINOPHEN 325 MG PO TABS
650.0000 mg | ORAL_TABLET | Freq: Once | ORAL | Status: AC | PRN
  Administered 2015-07-17: 650 mg via ORAL
  Filled 2015-07-17: qty 2

## 2015-07-17 MED ORDER — PREDNISONE 20 MG PO TABS
60.0000 mg | ORAL_TABLET | Freq: Once | ORAL | Status: AC
  Administered 2015-07-17: 60 mg via ORAL
  Filled 2015-07-17: qty 3

## 2015-07-17 MED ORDER — KETOROLAC TROMETHAMINE 30 MG/ML IJ SOLN
30.0000 mg | Freq: Once | INTRAMUSCULAR | Status: AC
  Administered 2015-07-17: 30 mg via INTRAVENOUS
  Filled 2015-07-17: qty 1

## 2015-07-17 MED ORDER — IBUPROFEN 800 MG PO TABS
800.0000 mg | ORAL_TABLET | Freq: Once | ORAL | Status: AC
Start: 2015-07-17 — End: 2015-07-17
  Administered 2015-07-17: 800 mg via ORAL
  Filled 2015-07-17: qty 1

## 2015-07-17 MED ORDER — AMOXICILLIN-POT CLAVULANATE 875-125 MG PO TABS: 1.0000 | ORAL_TABLET | Freq: Two times a day (BID) | ORAL | Status: DC

## 2015-07-17 NOTE — Care Management (Signed)
ED CM received a call concerning medication assistance for tessalon pearls prescription, patient referred to [facility] where the cost is $10, patient appreciative for the assistance. No other CM need identified.

## 2015-07-17 NOTE — Discharge Instructions (Signed)
How to Use an Inhaler Using your inhaler correctly is very important. Good technique will make sure that the medicine reaches your lungs.  HOW TO USE AN INHALER: 1. Take the cap off the inhaler. 2. If this is the first time using your inhaler, you need to prime it. Shake the inhaler for 5 seconds. Release four puffs into the air, away from your face. Ask your doctor for help if you have questions. 3. Shake the inhaler for 5 seconds. 4. Turn the inhaler so the bottle is above the mouthpiece. 5. Put your pointer finger on top of the bottle. Your thumb holds the bottom of the inhaler. 6. Open your mouth. 7. Either hold the inhaler away from your mouth (the width of 2 fingers) or place your lips tightly around the mouthpiece. Ask your doctor which way to use your inhaler. 8. Breathe out as much air as possible. 9. Breathe in and push down on the bottle 1 time to release the medicine. You will feel the medicine go in your mouth and throat. 10. Continue to take a deep breath in very slowly. Try to fill your lungs. 11. After you have breathed in completely, hold your breath for 10 seconds. This will help the medicine to settle in your lungs. If you cannot hold your breath for 10 seconds, hold it for as long as you can before you breathe out. 12. Breathe out slowly, through pursed lips. Whistling is an example of pursed lips. 13. If your doctor has told you to take more than 1 puff, wait at least 15-30 seconds between puffs. This will help you get the best results from your medicine. Do not use the inhaler more than your doctor tells you to. 14. Put the cap back on the inhaler. 15. Follow the directions from your doctor or from the inhaler package about cleaning the inhaler. If you use more than one inhaler, ask your doctor which inhalers to use and what order to use them in. Ask your doctor to help you figure out when you will need to refill your inhaler.  If you use a steroid inhaler, always rinse your  mouth with water after your last puff, gargle and spit out the water. Do not swallow the water. GET HELP IF:  The inhaler medicine only partially helps to stop wheezing or shortness of breath.  You are having trouble using your inhaler.  You have some increase in thick spit (phlegm). GET HELP RIGHT AWAY IF:  The inhaler medicine does not help your wheezing or shortness of breath or you have tightness in your chest.  You have dizziness, headaches, or fast heart rate.  You have chills, fever, or night sweats.  You have a large increase of thick spit, or your thick spit is bloody. MAKE SURE YOU:   Understand these instructions.  Will watch your condition.  Will get help right away if you are not doing well or get worse. Document Released: 09/22/2008 Document Revised: 10/04/2013 Document Reviewed: 07/13/2013 [facility] Patient Information 2015 [place], [place]. This information is not intended to replace advice given to you by your health care provider. Make sure you discuss any questions you have with your health care provider.

## 2015-07-17 NOTE — ED Notes (Signed)
Patient is feeling worse. He was in hospital 7/16 and was diagnosed with pneumonia. Patient has taken the zpak.

## 2015-07-17 NOTE — ED Notes (Signed)
Bed: [id number] Expected date: 07/17/15 Expected time: 4:20 AM Means of arrival: Ambulance Comments: Known PNA

## 2015-07-17 NOTE — ED Provider Notes (Signed)
CSN: [id number]     Arrival date & time 07/17/15  [id number] History   First MD Initiated Contact with Patient 07/17/15 0444     Chief Complaint  Patient presents with  . Fever    Patient is feeling worse. He was in hospital 7/16 and was diagnosed with pneumonia. Patient has taken the zpak.      (Consider location/radiation/quality/duration/timing/severity/associated sxs/prior Treatment) Patient is a 59 y.o. male presenting with fever. The history is provided by the patient.  Fever Temp source:  Oral Severity:  Moderate Onset quality:  Gradual Timing:  Constant Progression:  Unchanged Chronicity:  New Relieved by:  Nothing Worsened by:  Nothing tried Ineffective treatments:  None tried Associated symptoms: cough   Associated symptoms: no chest pain, no chills, no confusion, no ear pain, no myalgias, no rash, no rhinorrhea and no sore throat   Risk factors: no hx of cancer     Past Medical History  Diagnosis Date  . Gout   . Hypertension   . Diabetes mellitus without complication   . MI (myocardial infarction)   . Chronic bronchitis    Past Surgical History  Procedure Laterality Date  . Coronary artery bypass graft    . Knee surgery    . Rotator cuff repair    . Wrist surgery     History reviewed. No pertinent family history. History  Substance Use Topics  . Smoking status: Former Research scientist (life sciences)  . Smokeless tobacco: Not on file  . Alcohol Use: No    Review of Systems  Constitutional: Positive for fever. Negative for chills.  HENT: Negative for ear pain, rhinorrhea and sore throat.   Eyes: Negative for photophobia.  Respiratory: Positive for cough and wheezing. Negative for shortness of breath.   Cardiovascular: Negative for chest pain, palpitations and leg swelling.  Genitourinary: Negative for flank pain.  Musculoskeletal: Negative for myalgias and neck pain.  Skin: Negative for rash.  Psychiatric/Behavioral: Negative for confusion.  All other systems reviewed and are  negative.     Allergies  Review of patient's allergies indicates no known allergies.  Home Medications   Prior to Admission medications   Medication Sig Start Date End Date Taking? Authorizing Provider  albuterol (PROVENTIL HFA;VENTOLIN HFA) 108 (90 BASE) MCG/ACT inhaler Inhale 1-2 puffs into the lungs every 6 (six) hours as needed for wheezing or shortness of breath. 07/13/15  Yes [name], MD  albuterol (PROVENTIL) (2.5 MG/3ML) 0.083% nebulizer solution Take 2.5 mg by nebulization every 6 (six) hours as needed for wheezing or shortness of breath.   Yes Historical Provider, MD  aspirin 325 MG tablet Take 325 mg by mouth daily.   Yes Historical Provider, MD  atorvastatin (LIPITOR) 40 MG tablet Take 40 mg by mouth daily.   Yes Historical Provider, MD  azithromycin (ZITHROMAX) 250 MG tablet Take 1 tablet (250 mg total) by mouth daily. Take first 2 tablets together, then 1 every day until finished. 07/13/15  Yes [name], MD  dextromethorphan-guaiFENesin [facility] DM) 30-600 MG per 12 hr tablet Take 1 tablet by mouth 2 (two) times daily. 07/13/15  Yes [name], MD  doxazosin (CARDURA) 1 MG tablet Take 1 mg by mouth daily.   Yes Historical Provider, MD  HYDROcodone-acetaminophen (NORCO) 5-325 MG per tablet Take 1-2 tablets by mouth every 4 (four) hours as needed for severe pain. 07/04/15  Yes Sherwood Gambler, MD  lisinopril (PRINIVIL,ZESTRIL) 10 MG tablet Take 10 mg by mouth daily.   Yes Historical Provider, MD  metFORMIN (  GLUCOPHAGE) 1000 MG tablet Take 1,000 mg by mouth 2 (two) times daily with a meal.   Yes Historical Provider, MD  indomethacin (INDOCIN) 25 MG capsule Take 1 capsule (25 mg total) by mouth 3 (three) times daily as needed. 07/04/15   Sherwood Gambler, MD  naproxen (NAPROSYN) 500 MG tablet Take 500 mg by mouth 2 (two) times daily with a meal.    Historical Provider, MD   BP 159/96 mmHg  Pulse 80  Temp(Src) 102.7 F (39.3 C) (Oral)  Resp 18  Ht 5\' 10"  (1.778 m)   Wt 191 lb (86.637 kg)  BMI 27.41 kg/m2  SpO2 93% Physical Exam  Constitutional: He is oriented to person, place, and time. He appears well-developed and well-nourished. No distress.  HENT:  Head: Normocephalic and atraumatic.  Mouth/Throat: Oropharynx is clear and moist. No oropharyngeal exudate.  Eyes: Conjunctivae and EOM are normal. Pupils are equal, round, and reactive to light.  Neck: Normal range of motion. Neck supple.  Cardiovascular: Normal rate, regular rhythm and intact distal pulses.   Pulmonary/Chest: No respiratory distress. He has wheezes. He has no rales. He exhibits no tenderness.  Abdominal: Soft. Bowel sounds are normal. There is no tenderness. There is no rebound and no guarding.  Musculoskeletal: Normal range of motion. He exhibits no edema or tenderness.  Neurological: He is alert and oriented to person, place, and time. He has normal reflexes.  Skin: Skin is warm and dry.  Psychiatric: He has a normal mood and affect.    ED Course  Procedures (including critical care time) Labs Review Labs Reviewed  COMPREHENSIVE METABOLIC PANEL - Abnormal; Notable for the following:    Sodium 134 (*)    Glucose, Bld 105 (*)    BUN 26 (*)    Creatinine, Ser 1.58 (*)    Calcium 8.4 (*)    Total Protein 8.3 (*)    AST 65 (*)    GFR calc non Af Amer 46 (*)    GFR calc Af Amer 54 (*)    All other components within normal limits  URINALYSIS, ROUTINE W REFLEX MICROSCOPIC (NOT AT [facility]) - Abnormal; Notable for the following:    Hgb urine dipstick SMALL (*)    Protein, ur >300 (*)    All other components within normal limits  CBC WITH DIFFERENTIAL/PLATELET - Abnormal; Notable for the following:    RBC 3.62 (*)    Hemoglobin 11.0 (*)    HCT 33.8 (*)    Monocytes Relative 21 (*)    All other components within normal limits  I-STAT CHEM 8, ED - Abnormal; Notable for the following:    BUN 29 (*)    Creatinine, Ser 1.60 (*)    Glucose, Bld 109 (*)    Calcium, Ion 1.07 (*)     Hemoglobin 12.6 (*)    HCT 37.0 (*)    All other components within normal limits  CULTURE, BLOOD (ROUTINE X 2)  CULTURE, BLOOD (ROUTINE X 2)  URINE CULTURE  URINE MICROSCOPIC-ADD ON  I-STAT CG4 LACTIC ACID, ED    Imaging Review Dg Chest 2 View  07/17/2015   CLINICAL DATA:  Acute onset of cough, congestion and fever. Initial encounter.  EXAM: CHEST  2 VIEW  COMPARISON:  Chest radiograph from 07/13/2015  FINDINGS: The lungs are well-aerated. Vascular congestion is noted. Peribronchial thickening is seen. There is no evidence of focal opacification, pleural effusion or pneumothorax.  The heart is mildly enlarged. The patient is status post median sternotomy.  No acute osseous abnormalities are seen. The patient is status post rotator cuff repair on the right side.  IMPRESSION: Vascular congestion and mild cardiomegaly. Peribronchial thickening seen.   Electronically Signed   By: Garald Balding M.D.   On: 07/17/2015 06:24     EKG Interpretation None      MDM   Final diagnoses:  None    No PNA on CXR.  No hypotension nor tachycardia. Defervesced.  Will start augmentin BID with short course of steroids and NSAIDs for muscle pain.  Also tessalon.  Follow up with your PMD in 2 days.  Strict return precautions   Zinia Innocent, MD 07/17/15 [phone]

## 2015-07-17 NOTE — ED Notes (Signed)
Pt escorted to discharge window. Pt verbalized understanding discharge instructions. In no acute distress.  

## 2015-07-17 NOTE — ED Notes (Signed)
Requested patient to urinate. 

## 2015-07-18 LAB — URINE CULTURE: Culture: NO GROWTH

## 2015-07-22 LAB — CULTURE, BLOOD (ROUTINE X 2)
Culture: NO GROWTH
Culture: NO GROWTH

## 2015-09-14 ENCOUNTER — Emergency Department (HOSPITAL_COMMUNITY)
Admission: EM | Admit: 2015-09-14 | Discharge: 2015-09-14 | Disposition: A | Discharge status: Home / Self Care | Payer: Medicare Other | Attending: Physician Assistant | Admitting: Physician Assistant

## 2015-09-14 ENCOUNTER — Encounter (HOSPITAL_COMMUNITY): Payer: Self-pay

## 2015-09-14 ENCOUNTER — Emergency Department (HOSPITAL_COMMUNITY): Payer: Medicare Other

## 2015-09-14 DIAGNOSIS — Z8709 Personal history of other diseases of the respiratory system: Secondary | ICD-10-CM | POA: Insufficient documentation

## 2015-09-14 DIAGNOSIS — R2241 Localized swelling, mass and lump, right lower limb: Secondary | ICD-10-CM | POA: Diagnosis present

## 2015-09-14 DIAGNOSIS — R6883 Chills (without fever): Secondary | ICD-10-CM | POA: Diagnosis not present

## 2015-09-14 DIAGNOSIS — M25461 Effusion, right knee: Secondary | ICD-10-CM | POA: Diagnosis not present

## 2015-09-14 DIAGNOSIS — I252 Old myocardial infarction: Secondary | ICD-10-CM | POA: Insufficient documentation

## 2015-09-14 DIAGNOSIS — M10061 Idiopathic gout, right knee: Secondary | ICD-10-CM | POA: Insufficient documentation

## 2015-09-14 DIAGNOSIS — Z7982 Long term (current) use of aspirin: Secondary | ICD-10-CM | POA: Insufficient documentation

## 2015-09-14 DIAGNOSIS — I10 Essential (primary) hypertension: Secondary | ICD-10-CM | POA: Insufficient documentation

## 2015-09-14 DIAGNOSIS — M109 Gout, unspecified: Secondary | ICD-10-CM | POA: Diagnosis not present

## 2015-09-14 DIAGNOSIS — Z79899 Other long term (current) drug therapy: Secondary | ICD-10-CM | POA: Insufficient documentation

## 2015-09-14 DIAGNOSIS — Z87891 Personal history of nicotine dependence: Secondary | ICD-10-CM | POA: Insufficient documentation

## 2015-09-14 DIAGNOSIS — Z791 Long term (current) use of non-steroidal anti-inflammatories (NSAID): Secondary | ICD-10-CM | POA: Diagnosis not present

## 2015-09-14 DIAGNOSIS — R509 Fever, unspecified: Secondary | ICD-10-CM | POA: Diagnosis not present

## 2015-09-14 DIAGNOSIS — E119 Type 2 diabetes mellitus without complications: Secondary | ICD-10-CM | POA: Diagnosis not present

## 2015-09-14 DIAGNOSIS — Z792 Long term (current) use of antibiotics: Secondary | ICD-10-CM | POA: Insufficient documentation

## 2015-09-14 LAB — C-REACTIVE PROTEIN: CRP: 10.5 mg/dL — ABNORMAL HIGH (ref <1.0)

## 2015-09-14 LAB — CBC WITH DIFFERENTIAL/PLATELET
BASOS ABS: 0 10*3/uL (ref 0.0–0.1)
Basophils Relative: 0 %
EOS PCT: 3 %
Eosinophils Absolute: 0.2 10*3/uL (ref 0.0–0.7)
HEMATOCRIT: 38.2 % — AB (ref 39.0–52.0)
Hemoglobin: 12.7 g/dL — ABNORMAL LOW (ref 13.0–17.0)
LYMPHS PCT: 9 %
Lymphs Abs: 0.7 10*3/uL (ref 0.7–4.0)
MCH: 30.1 pg (ref 26.0–34.0)
MCHC: 33.2 g/dL (ref 30.0–36.0)
MCV: 90.5 fL (ref 78.0–100.0)
MONO ABS: 0.8 10*3/uL (ref 0.1–1.0)
MONOS PCT: 11 %
Neutro Abs: 6 10*3/uL (ref 1.7–7.7)
Neutrophils Relative %: 77 %
Platelets: 207 10*3/uL (ref 150–400)
RBC: 4.22 MIL/uL (ref 4.22–5.81)
RDW: 14.5 % (ref 11.5–15.5)
WBC: 7.7 10*3/uL (ref 4.0–10.5)

## 2015-09-14 LAB — SYNOVIAL CELL COUNT + DIFF, W/ CRYSTALS
EOSINOPHILS-SYNOVIAL: 0 % (ref 0–1)
Lymphocytes-Synovial Fld: 1 % (ref 0–20)
Monocyte-Macrophage-Synovial Fluid: 0 % — ABNORMAL LOW (ref 50–90)
NEUTROPHIL, SYNOVIAL: 99 % — AB (ref 0–25)
WBC, Synovial: 12794 /mm3 — ABNORMAL HIGH (ref 0–200)

## 2015-09-14 LAB — SEDIMENTATION RATE: SED RATE: 68 mm/h — AB (ref 0–16)

## 2015-09-14 LAB — CBG MONITORING, ED: Glucose-Capillary: 89 mg/dL (ref 65–99)

## 2015-09-14 LAB — URIC ACID: Uric Acid, Serum: 10.3 mg/dL — ABNORMAL HIGH (ref 4.4–7.6)

## 2015-09-14 LAB — BASIC METABOLIC PANEL
ANION GAP: 6 (ref 5–15)
BUN: 25 mg/dL — AB (ref 6–20)
CALCIUM: 9.1 mg/dL (ref 8.9–10.3)
CO2: 27 mmol/L (ref 22–32)
Chloride: 103 mmol/L (ref 101–111)
Creatinine, Ser: 1.58 mg/dL — ABNORMAL HIGH (ref 0.61–1.24)
GFR calc Af Amer: 54 mL/min — ABNORMAL LOW (ref >60)
GFR calc non Af Amer: 46 mL/min — ABNORMAL LOW (ref >60)
Glucose, Bld: 208 mg/dL — ABNORMAL HIGH (ref 65–99)
Potassium: 4.1 mmol/L (ref 3.5–5.1)
Sodium: 136 mmol/L (ref 135–145)

## 2015-09-14 LAB — I-STAT CG4 LACTIC ACID, ED: Lactic Acid, Venous: 1.5 mmol/L (ref 0.5–2.0)

## 2015-09-14 MED ORDER — OXYCODONE-ACETAMINOPHEN 5-325 MG PO TABS
2.0000 | ORAL_TABLET | Freq: Once | ORAL | Status: AC
  Administered 2015-09-14: 2 via ORAL
  Filled 2015-09-14: qty 2

## 2015-09-14 MED ORDER — IBUPROFEN 800 MG PO TABS
800.0000 mg | ORAL_TABLET | Freq: Once | ORAL | Status: AC
  Administered 2015-09-14: 800 mg via ORAL
  Filled 2015-09-14: qty 1

## 2015-09-14 MED ORDER — BUPIVACAINE HCL (PF) 0.5 % IJ SOLN
20.0000 mL | Freq: Once | INTRAMUSCULAR | Status: AC
  Administered 2015-09-14: 20 mL
  Filled 2015-09-14: qty 30

## 2015-09-14 MED ORDER — SODIUM CHLORIDE 0.9 % IV BOLUS (SEPSIS)
1000.0000 mL | Freq: Once | INTRAVENOUS | Status: AC
  Administered 2015-09-14: 1000 mL via INTRAVENOUS

## 2015-09-14 MED ORDER — OXYCODONE-ACETAMINOPHEN 5-325 MG PO TABS: 1.0000 | ORAL_TABLET | ORAL | Status: DC | PRN

## 2015-09-14 MED ORDER — IBUPROFEN 800 MG PO TABS
800.0000 mg | ORAL_TABLET | Freq: Once | ORAL | Status: DC
  Filled 2015-09-14: qty 1

## 2015-09-14 MED ORDER — INDOMETHACIN 25 MG PO CAPS: 25.0000 mg | ORAL_CAPSULE | Freq: Three times a day (TID) | ORAL | Status: DC | PRN

## 2015-09-14 NOTE — ED Provider Notes (Signed)
CSN: [id number]     Arrival date & time 09/14/15  1159 History  This chart was scribed for non-physician practitioner, Patrick Bible, PA-C working with Patrick Schwartz, MD by Evelene Croon, ED Scribe. This patient was seen in room WTR6/WTR6 and the patient's care was started at 12:30 PM.  Chief Complaint  Patient presents with  . Joint Swelling   The history is provided by the patient. No language interpreter was used.     HPI Comments:  Patrick Stewart is a 59 y.o. male with a history of gout, who presents to the Emergency Department complaining of 10/10 pain and swelling of the right knee for 3 days that has progressively worsened since onset.  Pt reports a history of same pain in his right knee due to his gout. He deneis fever, chills, nausea, vomiting and injury to the knee. However, he has a temp of 102.4 F orally upon arrival in the ED. He also denies recent ingestion of red meat and alcohol. He states in the past he has taken colchicine and indomethacin for his gout with relief but states he recently moved and at this time does not have a PCP to follow up with . No alleviating factors noted.     Past Medical History  Diagnosis Date  . Gout   . Hypertension   . Diabetes mellitus without complication   . MI (myocardial infarction)   . Chronic bronchitis    Past Surgical History  Procedure Laterality Date  . Coronary artery bypass graft    . Knee surgery    . Rotator cuff repair    . Wrist surgery     No family history on file. Social History  Substance Use Topics  . Smoking status: Former Research scientist (life sciences)  . Smokeless tobacco: None  . Alcohol Use: No    Review of Systems  Constitutional: Negative for fever and chills.  Musculoskeletal: Positive for joint swelling and arthralgias.  Skin: Negative for wound.  All other systems reviewed and are negative.   Allergies  Hydrocodone  Home Medications   Prior to Admission medications   Medication Sig Start Date End Date Taking?  Authorizing Provider  albuterol (PROVENTIL HFA;VENTOLIN HFA) 108 (90 BASE) MCG/ACT inhaler Inhale 1-2 puffs into the lungs every 6 (six) hours as needed for wheezing or shortness of breath. 07/13/15   [name], MD  albuterol (PROVENTIL HFA;VENTOLIN HFA) 108 (90 BASE) MCG/ACT inhaler Inhale 1-2 puffs into the lungs every 6 (six) hours as needed for wheezing or shortness of breath. 07/17/15   [name], MD  albuterol (PROVENTIL) (2.5 MG/3ML) 0.083% nebulizer solution Take 2.5 mg by nebulization every 6 (six) hours as needed for wheezing or shortness of breath.    Historical Provider, MD  amoxicillin-clavulanate (AUGMENTIN) 875-125 MG per tablet Take 1 tablet by mouth 2 (two) times daily. One po bid x 7 days 07/17/15   [name], MD  aspirin 325 MG tablet Take 325 mg by mouth daily.    Historical Provider, MD  atorvastatin (LIPITOR) 40 MG tablet Take 40 mg by mouth daily.    Historical Provider, MD  benzonatate (TESSALON) 100 MG capsule Take 1 capsule (100 mg total) by mouth every 8 (eight) hours. 07/17/15   [name], MD  dextromethorphan-guaiFENesin [facility] DM) 30-600 MG per 12 hr tablet Take 1 tablet by mouth 2 (two) times daily. 07/13/15   [name], MD  doxazosin (CARDURA) 1 MG tablet Take 1 mg by mouth daily.    Historical Provider, MD  HYDROcodone-acetaminophen (NORCO) 5-325 MG per tablet Take 1-2 tablets by mouth every 4 (four) hours as needed for severe pain. 07/04/15   Sherwood Gambler, MD  indomethacin (INDOCIN) 25 MG capsule Take 1 capsule (25 mg total) by mouth 3 (three) times daily as needed. 07/04/15   Sherwood Gambler, MD  lisinopril (PRINIVIL,ZESTRIL) 10 MG tablet Take 10 mg by mouth daily.    Historical Provider, MD  metFORMIN (GLUCOPHAGE) 1000 MG tablet Take 1,000 mg by mouth 2 (two) times daily with a meal.    Historical Provider, MD  naproxen (NAPROSYN) 500 MG tablet Take 500 mg by mouth 2 (two) times daily with a meal.    Historical Provider, MD  naproxen (NAPROSYN) 500  MG tablet Take 1 tablet (500 mg total) by mouth 2 (two) times daily. 07/17/15   [name], MD  predniSONE (DELTASONE) 20 MG tablet 3 tabs po day one, then 2 po daily x 4 days 07/17/15   [name], MD   There were no vitals taken for this visit. Physical Exam  Constitutional: He appears well-developed and well-nourished. No distress.  HENT:  Head: Normocephalic and atraumatic.  Eyes: Conjunctivae are normal.  Neck: Normal range of motion.  Cardiovascular: Normal rate and regular rhythm.   Pulmonary/Chest: Effort normal and breath sounds normal.  Musculoskeletal: He exhibits edema.  Diffuse swelling of the right knee  Mild erythema and warmth of thee right knee  ROM limited, most likely secondary to pain 2+ DP pulse on the right and distal sensation on the right foot is intact   Neurological: He is alert.  Skin: Skin is warm and dry.  Nursing note and vitals reviewed.   ED Course  Procedures   DIAGNOSTIC STUDIES:  Oxygen Saturation is 97% on RA, normal by my interpretation.    COORDINATION OF CARE:  12:36 PM Discussed treatment plan with pt at bedside and pt agreed to plan.  Labs Review Labs Reviewed - No data to display  Imaging Review Dg Knee Complete 4 Views Right  09/14/2015   CLINICAL DATA:  History of gout.  Right knee pain.  EXAM: RIGHT KNEE - COMPLETE 4+ VIEW  COMPARISON:  None.  FINDINGS: There is no acute fracture or dislocation. There is mild tricompartmental osteoarthritis of the right knee. There is a large joint effusion. There is chondrocalcinosis of the medial and lateral femorotibial compartments as can be seen with CPPD.  IMPRESSION: 1. No acute osseous injury of the right knee. 2. Large joint effusion. 3. Mild tricompartmental osteoarthritis of the right knee.   Electronically Signed   By: Kathreen Devoid   On: 09/14/2015 14:02   I have personally reviewed and evaluated these images and lab results as part of my medical decision-making.   EKG  Interpretation None      MDM   Final diagnoses:  None  Patient with a history of Gout presents today with left knee pain and was found to have a fever of 102.4 F orally in the ED.  Patient with limited ROM, most likely secondary to pain.  Due to fever, concern for Septic Arthritis.  Patient moved out of FT and moved to the acute part of the ED and will need to have arthrocentesis to rule out septic joint.  Arthrocentesis will be performed by next provider.  I personally performed the services described in this documentation, which was scribed in my presence. The recorded information has been reviewed and is accurate.    Patrick Bible, PA-C 09/14/15 2219  Patrick Schwartz, MD 09/15/15 [phone]

## 2015-09-14 NOTE — ED Notes (Signed)
He c/o swelling and pain of right knee which he recognizes as gout pain.

## 2015-09-14 NOTE — Discharge Instructions (Signed)
Gout Gout is an inflammatory arthritis caused by a buildup of uric acid crystals in the joints. Uric acid is a chemical that is normally present in the blood. When the level of uric acid in the blood is too high it can form crystals that deposit in your joints and tissues. This causes joint redness, soreness, and swelling (inflammation). Repeat attacks are common. Over time, uric acid crystals can form into masses (tophi) near a joint, destroying bone and causing disfigurement. Gout is treatable and often preventable. CAUSES  The disease begins with elevated levels of uric acid in the blood. Uric acid is produced by your body when it breaks down a naturally found substance called purines. Certain foods you eat, such as meats and fish, contain high amounts of purines. Causes of an elevated uric acid level include:  Being passed down from parent to child (heredity).  Diseases that cause increased uric acid production (such as obesity, psoriasis, and certain cancers).  Excessive alcohol use.  Diet, especially diets rich in meat and seafood.  Medicines, including certain cancer-fighting medicines (chemotherapy), water pills (diuretics), and aspirin.  Chronic kidney disease. The kidneys are no longer able to remove uric acid well.  Problems with metabolism. Conditions strongly associated with gout include:  Obesity.  High blood pressure.  High cholesterol.  Diabetes. Not everyone with elevated uric acid levels gets gout. It is not understood why some people get gout and others do not. Surgery, joint injury, and eating too much of certain foods are some of the factors that can lead to gout attacks. SYMPTOMS   An attack of gout comes on quickly. It causes intense pain with redness, swelling, and warmth in a joint.  Fever can occur.  Often, only one joint is involved. Certain joints are more commonly involved:  Base of the big toe.  Knee.  Ankle.  Wrist.  Finger. Without  treatment, an attack usually goes away in a few days to weeks. Between attacks, you usually will not have symptoms, which is different from many other forms of arthritis. DIAGNOSIS  Your caregiver will suspect gout based on your symptoms and exam. In some cases, tests may be recommended. The tests may include:  Blood tests.  Urine tests.  X-rays.  Joint fluid exam. This exam requires a needle to remove fluid from the joint (arthrocentesis). Using a microscope, gout is confirmed when uric acid crystals are seen in the joint fluid. TREATMENT  There are two phases to gout treatment: treating the sudden onset (acute) attack and preventing attacks (prophylaxis).  Treatment of an Acute Attack.  Medicines are used. These include anti-inflammatory medicines or steroid medicines.  An injection of steroid medicine into the affected joint is sometimes necessary.  The painful joint is rested. Movement can worsen the arthritis.  You may use warm or cold treatments on painful joints, depending which works best for you.  Treatment to Prevent Attacks.  If you suffer from frequent gout attacks, your caregiver may advise preventive medicine. These medicines are started after the acute attack subsides. These medicines either help your kidneys eliminate uric acid from your body or decrease your uric acid production. You may need to stay on these medicines for a very long time.  The early phase of treatment with preventive medicine can be associated with an increase in acute gout attacks. For this reason, during the first few months of treatment, your caregiver may also advise you to take medicines usually used for acute gout treatment. Be sure you  understand your caregiver's directions. Your caregiver may make several adjustments to your medicine dose before these medicines are effective.  Discuss dietary treatment with your caregiver or dietitian. Alcohol and drinks high in sugar and fructose and foods  such as meat, poultry, and seafood can increase uric acid levels. Your caregiver or dietitian can advise you on drinks and foods that should be limited. HOME CARE INSTRUCTIONS   Do not take aspirin to relieve pain. This raises uric acid levels.  Only take over-the-counter or prescription medicines for pain, discomfort, or fever as directed by your caregiver.  Rest the joint as much as possible. When in bed, keep sheets and blankets off painful areas.  Keep the affected joint raised (elevated).  Apply warm or cold treatments to painful joints. Use of warm or cold treatments depends on which works best for you.  Use crutches if the painful joint is in your leg.  Drink enough fluids to keep your urine clear or pale yellow. This helps your body get rid of uric acid. Limit alcohol, sugary drinks, and fructose drinks.  Follow your dietary instructions. Pay careful attention to the amount of protein you eat. Your daily diet should emphasize fruits, vegetables, whole grains, and fat-free or low-fat milk products. Discuss the use of coffee, vitamin C, and cherries with your caregiver or dietitian. These may be helpful in lowering uric acid levels.  Maintain a healthy body weight. SEEK MEDICAL CARE IF:   You develop diarrhea, vomiting, or any side effects from medicines.  You do not feel better in 24 hours, or you are getting worse. SEEK IMMEDIATE MEDICAL CARE IF:   Your joint becomes suddenly more tender, and you have chills or a fever. MAKE SURE YOU:   Understand these instructions.  Will watch your condition.  Will get help right away if you are not doing well or get worse. Document Released: 12/11/2000 Document Revised: 04/30/2014 Document Reviewed: 07/27/2012 [facility] Patient Information 2015 [place], [place]. This information is not intended to replace advice given to you by your health care provider. Make sure you discuss any questions you have with your health care  provider.  Fever, Adult A fever is a higher than normal body temperature. In an adult, an oral temperature around 98.6 F (37 C) is considered normal. A temperature of 100.4 F (38 C) or higher is generally considered a fever. Mild or moderate fevers generally have no long-term effects and often do not require treatment. Extreme fever (greater than or equal to 106 F or 41.1 C) can cause seizures. The sweating that may occur with repeated or prolonged fever may cause dehydration. Elderly people can develop confusion during a fever. A measured temperature can vary with:  Age.  Time of day.  Method of measurement (mouth, underarm, rectal, or ear). The fever is confirmed by taking a temperature with a thermometer. Temperatures can be taken different ways. Some methods are accurate and some are not.  An oral temperature is used most commonly. Electronic thermometers are fast and accurate.  An ear temperature will only be accurate if the thermometer is positioned as recommended by the manufacturer.  A rectal temperature is accurate and done for those adults who have a condition where an oral temperature cannot be taken.  An underarm (axillary) temperature is not accurate and not recommended. Fever is a symptom, not a disease.  CAUSES   Infections commonly cause fever.  Some noninfectious causes for fever include:  Some arthritis conditions.  Some thyroid or adrenal  gland conditions.  Some immune system conditions.  Some types of cancer.  A medicine reaction.  High doses of certain street drugs such as methamphetamine.  Dehydration.  Exposure to high outside or room temperatures.  Occasionally, the source of a fever cannot be determined. This is sometimes called a "fever of unknown origin" (FUO).  Some situations may lead to a temporary rise in body temperature that may go away on its own. Examples are:  Childbirth.  Surgery.  Intense exercise. HOME CARE INSTRUCTIONS    Take appropriate medicines for fever. Follow dosing instructions carefully. If you use acetaminophen to reduce the fever, be careful to avoid taking other medicines that also contain acetaminophen. Do not take aspirin for a fever if you are younger than age 58. There is an association with Reye's syndrome. Reye's syndrome is a rare but potentially deadly disease.  If an infection is present and antibiotics have been prescribed, take them as directed. Finish them even if you start to feel better.  Rest as needed.  Maintain an adequate fluid intake. To prevent dehydration during an illness with prolonged or recurrent fever, you may need to drink extra fluid.Drink enough fluids to keep your urine clear or pale yellow.  Sponging or bathing with room temperature water may help reduce body temperature. Do not use ice water or alcohol sponge baths.  Dress comfortably, but do not over-bundle. SEEK MEDICAL CARE IF:   You are unable to keep fluids down.  You develop vomiting or diarrhea.  You are not feeling at least partly better after 3 days.  You develop new symptoms or problems. SEEK IMMEDIATE MEDICAL CARE IF:   You have shortness of breath or trouble breathing.  You develop excessive weakness.  You are dizzy or you faint.  You are extremely thirsty or you are making little or no urine.  You develop new pain that was not there before (such as in the head, neck, chest, back, or abdomen).  You have persistent vomiting and diarrhea for more than 1 to 2 days.  You develop a stiff neck or your eyes become sensitive to light.  You develop a skin rash.  You have a fever or persistent symptoms for more than 2 to 3 days.  You have a fever and your symptoms suddenly get worse. MAKE SURE YOU:   Understand these instructions.  Will watch your condition.  Will get help right away if you are not doing well or get worse. Document Released: 06/09/2001 Document Revised: 04/30/2014  Document Reviewed: 10/15/2011 [facility] Patient Information 2015 [place], [place]. This information is not intended to replace advice given to you by your health care provider. Make sure you discuss any questions you have with your health care provider.

## 2015-09-14 NOTE — ED Notes (Signed)
Bed: [id number] Expected date:  Expected time:  Means of arrival:  Comments: Pt still in room

## 2015-09-14 NOTE — ED Notes (Signed)
Lab called with gram stain results and reports no organisms but confirms WBC

## 2015-09-14 NOTE — ED Notes (Signed)
PA at bedside.

## 2015-09-14 NOTE — ED Provider Notes (Signed)
Patient taken form PA [place]. Patient was seen in fast track for R knee swelling and pain. x3 days. Presents with fever of 102.7  Taking indomethacin at home without relief. Unable to ambulate. PMH DM, CAD, MI. Previous hx of gout.  Right knee exam shows large joint effusion. Patient knee held in about 10 degrees of flexion he is able to move the joint a few degrees. Warm, red, ttp/  ARTHOCENTESIS Date/Time: 09/14/2015 4:05 PM Performed by: Margarita Mail Authorized by: Margarita Mail Consent: Written consent obtained. Risks and benefits: risks, benefits and alternatives were discussed Consent given by: patient Patient understanding: patient states understanding of the procedure being performed Patient consent: the patient's understanding of the procedure matches consent given Required items: required blood products, implants, devices, and special equipment available Patient identity confirmed: provided demographic data Time out: Immediately prior to procedure a "time out" was called to verify the correct patient, procedure, equipment, support staff and site/side marked as required. Indications: joint swelling,  diagnostic evaluation and possible septic joint  Body area: knee Local anesthesia used: yes Local anesthetic: bupivacaine 0.5% without epinephrine and topical anesthetic Anesthetic total: 5 ml Patient sedated: no Preparation: Patient was prepped and draped in the usual sterile fashion. Needle gauge: 18 G Ultrasound guidance: no Approach: lateral Aspirate: blood-tinged,  yellow and clear Aspirate amount: 50 mL Bupivacaine 0.50% amount: 5 ml Patient tolerance: Patient tolerated the procedure well with no immediate complications     Filed Vitals:   09/14/15 1246 09/14/15 1537 09/14/15 1832  BP: 175/94 163/88 180/95  Pulse: 86 77 74  Temp: 102.4 F (39.1 C) 97.9 F (36.6 C) 98.4 F (36.9 C)  TempSrc: Oral Oral Oral  Resp: 18 20 17   SpO2: 97% 98% 97%   Patient  hypertension, he has maintained and afebrile state for several hours. Patient fever treated with 800 mg of Motrin. Patient is well-appearing, given fluids. He is in normal white blood cell count, his creatinine appears to be at baseline. Sedimentation rate and uric acid levels are elevated, and analysis of the synovial fluid reveals high amount of monosodium urate crystals without any sign of bacteria. Patient will be discharged with pain medication and indomethacin. He is to follow with her primary care physician. Given crutches for ambulation. Patient's pain was greatly improved with aspiration of fluid from the knee.. Greater than 50 mL of fluid. He appears safe for discharge at this time. Patient is to return if his fever is uncontrolled or he develops new or worsening symptoms. This is a septic arthritis. Feel his fever secondary to gout flare.  Margarita Mail, PA-C 09/14/15 1911  Courteney Julio Alm, MD 09/14/15 2316

## 2015-09-15 LAB — GLUCOSE, SYNOVIAL FLUID: Glucose, Synovial Fluid: 70 mg/dL

## 2015-09-15 LAB — GRAM STAIN

## 2015-09-19 LAB — CULTURE, BLOOD (ROUTINE X 2)
CULTURE: NO GROWTH
CULTURE: NO GROWTH

## 2015-09-20 LAB — CULTURE, BODY FLUID W GRAM STAIN -BOTTLE: Culture: NO GROWTH

## 2015-09-20 LAB — CULTURE, BODY FLUID-BOTTLE

## 2015-10-12 ENCOUNTER — Emergency Department (HOSPITAL_COMMUNITY): Payer: Medicare Other

## 2015-10-12 ENCOUNTER — Observation Stay (HOSPITAL_COMMUNITY)
Admission: EM | Admit: 2015-10-12 | Discharge: 2015-10-14 | Disposition: A | Discharge status: Home / Self Care | Payer: Medicare Other | Attending: Internal Medicine | Admitting: Internal Medicine

## 2015-10-12 ENCOUNTER — Encounter (HOSPITAL_COMMUNITY): Payer: Self-pay

## 2015-10-12 DIAGNOSIS — E1122 Type 2 diabetes mellitus with diabetic chronic kidney disease: Secondary | ICD-10-CM | POA: Insufficient documentation

## 2015-10-12 DIAGNOSIS — N400 Enlarged prostate without lower urinary tract symptoms: Secondary | ICD-10-CM | POA: Insufficient documentation

## 2015-10-12 DIAGNOSIS — R1031 Right lower quadrant pain: Secondary | ICD-10-CM | POA: Diagnosis not present

## 2015-10-12 DIAGNOSIS — I25119 Atherosclerotic heart disease of native coronary artery with unspecified angina pectoris: Secondary | ICD-10-CM | POA: Diagnosis not present

## 2015-10-12 DIAGNOSIS — Z79891 Long term (current) use of opiate analgesic: Secondary | ICD-10-CM | POA: Diagnosis not present

## 2015-10-12 DIAGNOSIS — I255 Ischemic cardiomyopathy: Secondary | ICD-10-CM | POA: Insufficient documentation

## 2015-10-12 DIAGNOSIS — I2510 Atherosclerotic heart disease of native coronary artery without angina pectoris: Secondary | ICD-10-CM | POA: Diagnosis not present

## 2015-10-12 DIAGNOSIS — Z79899 Other long term (current) drug therapy: Secondary | ICD-10-CM | POA: Diagnosis not present

## 2015-10-12 DIAGNOSIS — N183 Chronic kidney disease, stage 3 unspecified: Secondary | ICD-10-CM | POA: Diagnosis present

## 2015-10-12 DIAGNOSIS — R079 Chest pain, unspecified: Secondary | ICD-10-CM | POA: Diagnosis not present

## 2015-10-12 DIAGNOSIS — R9431 Abnormal electrocardiogram [ECG] [EKG]: Secondary | ICD-10-CM | POA: Diagnosis not present

## 2015-10-12 DIAGNOSIS — N189 Chronic kidney disease, unspecified: Secondary | ICD-10-CM | POA: Diagnosis not present

## 2015-10-12 DIAGNOSIS — Z7982 Long term (current) use of aspirin: Secondary | ICD-10-CM | POA: Diagnosis not present

## 2015-10-12 DIAGNOSIS — I10 Essential (primary) hypertension: Secondary | ICD-10-CM | POA: Diagnosis present

## 2015-10-12 DIAGNOSIS — Z7984 Long term (current) use of oral hypoglycemic drugs: Secondary | ICD-10-CM | POA: Insufficient documentation

## 2015-10-12 DIAGNOSIS — N201 Calculus of ureter: Secondary | ICD-10-CM | POA: Diagnosis not present

## 2015-10-12 DIAGNOSIS — Z951 Presence of aortocoronary bypass graft: Secondary | ICD-10-CM | POA: Insufficient documentation

## 2015-10-12 DIAGNOSIS — M109 Gout, unspecified: Secondary | ICD-10-CM | POA: Insufficient documentation

## 2015-10-12 DIAGNOSIS — I252 Old myocardial infarction: Secondary | ICD-10-CM | POA: Insufficient documentation

## 2015-10-12 DIAGNOSIS — J449 Chronic obstructive pulmonary disease, unspecified: Secondary | ICD-10-CM | POA: Insufficient documentation

## 2015-10-12 DIAGNOSIS — F17200 Nicotine dependence, unspecified, uncomplicated: Secondary | ICD-10-CM | POA: Diagnosis not present

## 2015-10-12 DIAGNOSIS — Z451 Encounter for adjustment and management of infusion pump: Secondary | ICD-10-CM | POA: Diagnosis not present

## 2015-10-12 DIAGNOSIS — I130 Hypertensive heart and chronic kidney disease with heart failure and stage 1 through stage 4 chronic kidney disease, or unspecified chronic kidney disease: Secondary | ICD-10-CM | POA: Insufficient documentation

## 2015-10-12 DIAGNOSIS — R109 Unspecified abdominal pain: Secondary | ICD-10-CM

## 2015-10-12 DIAGNOSIS — I5022 Chronic systolic (congestive) heart failure: Secondary | ICD-10-CM | POA: Insufficient documentation

## 2015-10-12 DIAGNOSIS — R0789 Other chest pain: Secondary | ICD-10-CM | POA: Diagnosis not present

## 2015-10-12 LAB — COMPREHENSIVE METABOLIC PANEL
ALBUMIN: 4 g/dL (ref 3.5–5.0)
ALK PHOS: 61 U/L (ref 38–126)
ALT: 14 U/L — AB (ref 17–63)
AST: 18 U/L (ref 15–41)
Anion gap: 6 (ref 5–15)
BUN: 36 mg/dL — ABNORMAL HIGH (ref 6–20)
CALCIUM: 8.8 mg/dL — AB (ref 8.9–10.3)
CO2: 27 mmol/L (ref 22–32)
CREATININE: 1.86 mg/dL — AB (ref 0.61–1.24)
Chloride: 103 mmol/L (ref 101–111)
GFR calc Af Amer: 44 mL/min — ABNORMAL LOW (ref >60)
GFR calc non Af Amer: 38 mL/min — ABNORMAL LOW (ref >60)
GLUCOSE: 153 mg/dL — AB (ref 65–99)
Potassium: 4.2 mmol/L (ref 3.5–5.1)
SODIUM: 136 mmol/L (ref 135–145)
Total Bilirubin: 1 mg/dL (ref 0.3–1.2)
Total Protein: 9.3 g/dL — ABNORMAL HIGH (ref 6.5–8.1)

## 2015-10-12 LAB — URINE MICROSCOPIC-ADD ON

## 2015-10-12 LAB — CBC WITH DIFFERENTIAL/PLATELET
BASOS PCT: 0 %
Basophils Absolute: 0 10*3/uL (ref 0.0–0.1)
EOS ABS: 0.4 10*3/uL (ref 0.0–0.7)
Eosinophils Relative: 7 %
HCT: 37.4 % — ABNORMAL LOW (ref 39.0–52.0)
Hemoglobin: 12.4 g/dL — ABNORMAL LOW (ref 13.0–17.0)
LYMPHS PCT: 20 %
Lymphs Abs: 1.1 10*3/uL (ref 0.7–4.0)
MCH: 29.5 pg (ref 26.0–34.0)
MCHC: 33.2 g/dL (ref 30.0–36.0)
MCV: 89 fL (ref 78.0–100.0)
MONO ABS: 0.7 10*3/uL (ref 0.1–1.0)
Monocytes Relative: 13 %
NEUTROS ABS: 3.4 10*3/uL (ref 1.7–7.7)
NEUTROS PCT: 60 %
PLATELETS: 205 10*3/uL (ref 150–400)
RBC: 4.2 MIL/uL — ABNORMAL LOW (ref 4.22–5.81)
RDW: 14.6 % (ref 11.5–15.5)
WBC: 5.7 10*3/uL (ref 4.0–10.5)

## 2015-10-12 LAB — URINALYSIS, ROUTINE W REFLEX MICROSCOPIC
Bilirubin Urine: NEGATIVE
GLUCOSE, UA: NEGATIVE mg/dL
Ketones, ur: NEGATIVE mg/dL
LEUKOCYTES UA: NEGATIVE
Nitrite: NEGATIVE
PROTEIN: 100 mg/dL — AB
Specific Gravity, Urine: 1.027 (ref 1.005–1.030)
Urobilinogen, UA: 1 mg/dL (ref 0.0–1.0)
pH: 5 (ref 5.0–8.0)

## 2015-10-12 LAB — TROPONIN I: Troponin I: 0.1 ng/mL — ABNORMAL HIGH (ref <0.031)

## 2015-10-12 LAB — D-DIMER, QUANTITATIVE: D-Dimer, Quant: 1.56 ug/mL-FEU — ABNORMAL HIGH (ref 0.00–0.48)

## 2015-10-12 MED ORDER — TECHNETIUM TC 99M DIETHYLENETRIAME-PENTAACETIC ACID: 32.3000 | Freq: Once | INTRAVENOUS | Status: DC | PRN

## 2015-10-12 MED ORDER — MORPHINE SULFATE (PF) 4 MG/ML IV SOLN
4.0000 mg | Freq: Once | INTRAVENOUS | Status: AC
  Administered 2015-10-13: 4 mg via INTRAVENOUS
  Filled 2015-10-12: qty 1

## 2015-10-12 MED ORDER — MORPHINE SULFATE (PF) 4 MG/ML IV SOLN
4.0000 mg | Freq: Once | INTRAVENOUS | Status: AC
  Administered 2015-10-12: 4 mg via INTRAVENOUS
  Filled 2015-10-12: qty 1

## 2015-10-12 MED ORDER — LORAZEPAM 2 MG/ML IJ SOLN
1.0000 mg | Freq: Once | INTRAMUSCULAR | Status: AC
  Administered 2015-10-12: 1 mg via INTRAVENOUS
  Filled 2015-10-12: qty 1

## 2015-10-12 MED ORDER — ALBUTEROL SULFATE (2.5 MG/3ML) 0.083% IN NEBU
5.0000 mg | INHALATION_SOLUTION | RESPIRATORY_TRACT | Status: AC
  Administered 2015-10-12: 5 mg via RESPIRATORY_TRACT
  Filled 2015-10-12: qty 6

## 2015-10-12 MED ORDER — TECHNETIUM TO 99M ALBUMIN AGGREGATED
3.2000 | Freq: Once | INTRAVENOUS | Status: AC | PRN
  Administered 2015-10-12: 3 via INTRAVENOUS

## 2015-10-12 MED ORDER — LORAZEPAM 2 MG/ML IJ SOLN
1.0000 mg | Freq: Once | INTRAMUSCULAR | Status: AC
  Administered 2015-10-13: 1 mg via INTRAVENOUS
  Filled 2015-10-12: qty 1

## 2015-10-12 MED ORDER — SODIUM CHLORIDE 0.9 % IV SOLN
INTRAVENOUS | Status: DC
  Administered 2015-10-12: 18:00:00 via INTRAVENOUS

## 2015-10-12 NOTE — ED Notes (Signed)
Pt being transported to nuclear med by Rica Mote and Jan.

## 2015-10-12 NOTE — ED Provider Notes (Signed)
CSN: [id number]     Arrival date & time 10/12/15  1654 History   First MD Initiated Contact with Patient 10/12/15 1710     Chief Complaint  Patient presents with  . Chest Pain     (Consider location/radiation/quality/duration/timing/severity/associated sxs/prior Treatment) HPI Comments: Patient here complaining of persistent right-sided posterior thoracic pain characterized as sharp and worse with movements. States he is distant because it hurts to breathe. Denies any exertional component to this. Pain is been there for the past 3 days and has been constant and better with remaining still. States that this is not like his prior angina. He has not had any diaphoresis, nausea vomiting. Denies any fever or cough. Denies any rashes to the area. Denies recent history of trauma.  Patient is a 59 y.o. male presenting with chest pain. The history is provided by the patient.  Chest Pain   Past Medical History  Diagnosis Date  . Gout   . Hypertension   . Diabetes mellitus without complication ([place])   . MI (myocardial infarction) ([place])   . Chronic bronchitis [facility])    Past Surgical History  Procedure Laterality Date  . Coronary artery bypass graft    . Knee surgery    . Rotator cuff repair    . Wrist surgery     No family history on file. Social History  Substance Use Topics  . Smoking status: Former Research scientist (life sciences)  . Smokeless tobacco: None  . Alcohol Use: No    Review of Systems  Cardiovascular: Positive for chest pain.  All other systems reviewed and are negative.     Allergies  Hydrocodone  Home Medications   Prior to Admission medications   Medication Sig Start Date End Date Taking? Authorizing Provider  albuterol (PROVENTIL HFA;VENTOLIN HFA) 108 (90 BASE) MCG/ACT inhaler Inhale 1-2 puffs into the lungs every 6 (six) hours as needed for wheezing or shortness of breath. 07/13/15   [name], MD  albuterol (PROVENTIL HFA;VENTOLIN HFA) 108 (90 BASE) MCG/ACT inhaler Inhale  1-2 puffs into the lungs every 6 (six) hours as needed for wheezing or shortness of breath. Patient not taking: Reported on 09/14/2015 07/17/15   [name], MD  albuterol (PROVENTIL) (2.5 MG/3ML) 0.083% nebulizer solution Take 2.5 mg by nebulization every 6 (six) hours as needed for wheezing or shortness of breath.    Historical Provider, MD  amoxicillin-clavulanate (AUGMENTIN) 875-125 MG per tablet Take 1 tablet by mouth 2 (two) times daily. One po bid x 7 days Patient not taking: Reported on 09/14/2015 07/17/15   [name], MD  aspirin 325 MG tablet Take 325 mg by mouth daily.    Historical Provider, MD  atorvastatin (LIPITOR) 40 MG tablet Take 40 mg by mouth daily.    Historical Provider, MD  benzonatate (TESSALON) 100 MG capsule Take 1 capsule (100 mg total) by mouth every 8 (eight) hours. Patient not taking: Reported on 09/14/2015 07/17/15   [name], MD  dextromethorphan-guaiFENesin [facility] DM) 30-600 MG per 12 hr tablet Take 1 tablet by mouth 2 (two) times daily. Patient not taking: Reported on 09/14/2015 07/13/15   [name], MD  indomethacin (INDOCIN) 25 MG capsule Take 1 capsule (25 mg total) by mouth 3 (three) times daily as needed. 09/14/15   Margarita Mail, PA-C  lisinopril (PRINIVIL,ZESTRIL) 10 MG tablet Take 10 mg by mouth daily.    Historical Provider, MD  metFORMIN (GLUCOPHAGE) 1000 MG tablet Take 1,000 mg by mouth 2 (two) times daily with a meal.  Historical Provider, MD  oxyCODONE-acetaminophen (PERCOCET) 5-325 MG per tablet Take 1-2 tablets by mouth every 4 (four) hours as needed. 09/14/15   Margarita Mail, PA-C   BP 213/109 mmHg  Pulse 86  Temp(Src) 98.7 F (37.1 C) (Oral)  Resp 18  SpO2 100% Physical Exam  Constitutional: He is oriented to person, place, and time. He appears well-developed and well-nourished.  Non-toxic appearance. No distress.  HENT:  Head: Normocephalic and atraumatic.  Eyes: Conjunctivae, EOM and lids are normal. Pupils are equal, round,  and reactive to light.  Neck: Normal range of motion. Neck supple. No tracheal deviation present. No thyroid mass present.  Cardiovascular: Normal rate, regular rhythm and normal heart sounds.  Exam reveals no gallop.   No murmur heard. Pulmonary/Chest: Effort normal and breath sounds normal. No stridor. No respiratory distress. He has no decreased breath sounds. He has no wheezes. He has no rhonchi. He has no rales.    Abdominal: Soft. Normal appearance and bowel sounds are normal. He exhibits no distension. There is no tenderness. There is no rebound and no CVA tenderness.  Musculoskeletal: Normal range of motion. He exhibits no edema or tenderness.  Neurological: He is alert and oriented to person, place, and time. He has normal strength. No cranial nerve deficit or sensory deficit. GCS eye subscore is 4. GCS verbal subscore is 5. GCS motor subscore is 6.  Skin: Skin is warm and dry. No abrasion and no rash noted.  Psychiatric: He has a normal mood and affect. His speech is normal and behavior is normal.  Nursing note and vitals reviewed.   ED Course  Procedures (including critical care time) Labs Review Labs Reviewed  CBC WITH DIFFERENTIAL/PLATELET  COMPREHENSIVE METABOLIC PANEL  TROPONIN I  D-DIMER, QUANTITATIVE (NOT AT [facility])  URINALYSIS, ROUTINE W REFLEX MICROSCOPIC (NOT AT [facility])    Imaging Review No results found. I have personally reviewed and evaluated these images and lab results as part of my medical decision-making.   EKG Interpretation   Date/Time:  Saturday October 12 2015 17:08:00 EDT Ventricular Rate:  79 PR Interval:  125 QRS Duration: 126 QT Interval:  390 QTC Calculation: 447 R Axis:   -34 Text Interpretation:  Sinus or ectopic atrial rhythm LVH with IVCD and  secondary repol abnrm Inferior infarct, old Anterior ST elevation,  probably due to LVH Confirmed by Rosali Augello  MD, Teria Khachatryan ([id number]) on 10/12/2015  5:24:08 PM      MDM   Final diagnoses:  Chest  pain   Patient's blood pressure has improved on its own. No evidence of pulmonary embolism based on VQ scan. Patient's creatinine elevated from baseline. Does have a bump in his troponin. Likely from renal issues. Will be admitted for serial troponins.     Lacretia Leigh, MD 10/12/15 [phone]

## 2015-10-12 NOTE — ED Notes (Signed)
Pt returned from [facility] med

## 2015-10-12 NOTE — ED Notes (Signed)
Awaiting call back from radiology about the status of pulmonary perfusion study.

## 2015-10-12 NOTE — ED Notes (Signed)
He c/o right lat. Chest/thoracic area pain which began yesterday and persists.  His skin is normal, warm and dry.  He states this pain was minimally relieved by oral oxycodone and he states he is concerned d/t his cardiac hx (m.i. And cabg).  EKG performed at triage.

## 2015-10-12 NOTE — ED Notes (Addendum)
Pt requesting a breathing treatment and states "I'm wheezing". This RN performs respiratory assessment and hears crackles but no wheezing. Pt requesting a breathing treatment. MD Zenia Resides made aware of pt request.

## 2015-10-13 ENCOUNTER — Observation Stay (HOSPITAL_COMMUNITY): Payer: Medicare Other

## 2015-10-13 ENCOUNTER — Encounter (HOSPITAL_COMMUNITY): Payer: Self-pay | Admitting: Internal Medicine

## 2015-10-13 ENCOUNTER — Observation Stay (HOSPITAL_BASED_OUTPATIENT_CLINIC_OR_DEPARTMENT_OTHER): Payer: Medicare Other

## 2015-10-13 DIAGNOSIS — R0789 Other chest pain: Secondary | ICD-10-CM

## 2015-10-13 DIAGNOSIS — N183 Chronic kidney disease, stage 3 unspecified: Secondary | ICD-10-CM | POA: Diagnosis present

## 2015-10-13 DIAGNOSIS — R109 Unspecified abdominal pain: Secondary | ICD-10-CM | POA: Diagnosis not present

## 2015-10-13 DIAGNOSIS — E1122 Type 2 diabetes mellitus with diabetic chronic kidney disease: Secondary | ICD-10-CM | POA: Diagnosis present

## 2015-10-13 DIAGNOSIS — I255 Ischemic cardiomyopathy: Secondary | ICD-10-CM | POA: Diagnosis not present

## 2015-10-13 DIAGNOSIS — I10 Essential (primary) hypertension: Secondary | ICD-10-CM

## 2015-10-13 DIAGNOSIS — I2510 Atherosclerotic heart disease of native coronary artery without angina pectoris: Secondary | ICD-10-CM | POA: Diagnosis present

## 2015-10-13 DIAGNOSIS — M109 Gout, unspecified: Secondary | ICD-10-CM | POA: Diagnosis present

## 2015-10-13 DIAGNOSIS — R079 Chest pain, unspecified: Secondary | ICD-10-CM | POA: Diagnosis not present

## 2015-10-13 DIAGNOSIS — R9431 Abnormal electrocardiogram [ECG] [EKG]: Secondary | ICD-10-CM | POA: Diagnosis present

## 2015-10-13 LAB — TROPONIN I
TROPONIN I: 0.08 ng/mL — AB (ref <0.031)
TROPONIN I: 0.09 ng/mL — AB (ref <0.031)
TROPONIN I: 0.09 ng/mL — AB (ref <0.031)
TROPONIN I: 0.1 ng/mL — AB (ref <0.031)

## 2015-10-13 LAB — MRSA PCR SCREENING: MRSA BY PCR: POSITIVE — AB

## 2015-10-13 LAB — COMPREHENSIVE METABOLIC PANEL
ALBUMIN: 3.6 g/dL (ref 3.5–5.0)
ALK PHOS: 54 U/L (ref 38–126)
ALT: 11 U/L — AB (ref 17–63)
ANION GAP: 11 (ref 5–15)
AST: 17 U/L (ref 15–41)
BUN: 32 mg/dL — ABNORMAL HIGH (ref 6–20)
CO2: 21 mmol/L — ABNORMAL LOW (ref 22–32)
Calcium: 8.5 mg/dL — ABNORMAL LOW (ref 8.9–10.3)
Chloride: 105 mmol/L (ref 101–111)
Creatinine, Ser: 1.59 mg/dL — ABNORMAL HIGH (ref 0.61–1.24)
GFR calc non Af Amer: 46 mL/min — ABNORMAL LOW (ref >60)
GFR, EST AFRICAN AMERICAN: 53 mL/min — AB (ref >60)
GLUCOSE: 165 mg/dL — AB (ref 65–99)
Potassium: 4.3 mmol/L (ref 3.5–5.1)
SODIUM: 137 mmol/L (ref 135–145)
TOTAL PROTEIN: 8.4 g/dL — AB (ref 6.5–8.1)
Total Bilirubin: 0.7 mg/dL (ref 0.3–1.2)

## 2015-10-13 LAB — CBC WITH DIFFERENTIAL/PLATELET
BASOS PCT: 0 %
Basophils Absolute: 0 10*3/uL (ref 0.0–0.1)
EOS ABS: 0.3 10*3/uL (ref 0.0–0.7)
EOS PCT: 5 %
HCT: 34.5 % — ABNORMAL LOW (ref 39.0–52.0)
HEMOGLOBIN: 11.4 g/dL — AB (ref 13.0–17.0)
Lymphocytes Relative: 15 %
Lymphs Abs: 1.1 10*3/uL (ref 0.7–4.0)
MCH: 29.6 pg (ref 26.0–34.0)
MCHC: 33 g/dL (ref 30.0–36.0)
MCV: 89.6 fL (ref 78.0–100.0)
Monocytes Absolute: 0.9 10*3/uL (ref 0.1–1.0)
Monocytes Relative: 12 %
NEUTROS PCT: 68 %
Neutro Abs: 4.7 10*3/uL (ref 1.7–7.7)
PLATELETS: 202 10*3/uL (ref 150–400)
RBC: 3.85 MIL/uL — AB (ref 4.22–5.81)
RDW: 14.7 % (ref 11.5–15.5)
WBC: 7 10*3/uL (ref 4.0–10.5)

## 2015-10-13 LAB — GLUCOSE, CAPILLARY
GLUCOSE-CAPILLARY: 140 mg/dL — AB (ref 65–99)
GLUCOSE-CAPILLARY: 134 mg/dL — AB (ref 65–99)
GLUCOSE-CAPILLARY: 132 mg/dL — AB (ref 65–99)
GLUCOSE-CAPILLARY: 135 mg/dL — AB (ref 65–99)
Glucose-Capillary: 153 mg/dL — ABNORMAL HIGH (ref 65–99)
Glucose-Capillary: 139 mg/dL — ABNORMAL HIGH (ref 65–99)

## 2015-10-13 LAB — RAPID URINE DRUG SCREEN, HOSP PERFORMED
AMPHETAMINES: NOT DETECTED
BENZODIAZEPINES: NOT DETECTED
Barbiturates: NOT DETECTED
Cocaine: NOT DETECTED
OPIATES: POSITIVE — AB
TETRAHYDROCANNABINOL: NOT DETECTED

## 2015-10-13 LAB — CBG MONITORING, ED: Glucose-Capillary: 165 mg/dL — ABNORMAL HIGH (ref 65–99)

## 2015-10-13 LAB — PROTIME-INR
INR: 1.17 (ref 0.00–1.49)
PROTHROMBIN TIME: 15.1 s (ref 11.6–15.2)

## 2015-10-13 MED ORDER — INSULIN ASPART 100 UNIT/ML ~~LOC~~ SOLN: 0.0000 [IU] | Freq: Every day | SUBCUTANEOUS | Status: DC

## 2015-10-13 MED ORDER — NIFEDIPINE ER 60 MG PO TB24
60.0000 mg | ORAL_TABLET | Freq: Every day | ORAL | Status: DC
  Filled 2015-10-13: qty 1

## 2015-10-13 MED ORDER — ACETAMINOPHEN 325 MG PO TABS: 650.0000 mg | ORAL_TABLET | ORAL | Status: DC | PRN

## 2015-10-13 MED ORDER — TAMSULOSIN HCL 0.4 MG PO CAPS
0.4000 mg | ORAL_CAPSULE | Freq: Every day | ORAL | Status: DC
  Administered 2015-10-13: 0.4 mg via ORAL
  Filled 2015-10-13: qty 1

## 2015-10-13 MED ORDER — ATORVASTATIN CALCIUM 40 MG PO TABS
40.0000 mg | ORAL_TABLET | Freq: Every day | ORAL | Status: DC
  Administered 2015-10-13: 40 mg via ORAL
  Filled 2015-10-13: qty 1

## 2015-10-13 MED ORDER — ASPIRIN 325 MG PO TABS
325.0000 mg | ORAL_TABLET | Freq: Every day | ORAL | Status: DC
  Administered 2015-10-13 – 2015-10-14 (×2): 325 mg via ORAL
  Filled 2015-10-13 (×2): qty 1

## 2015-10-13 MED ORDER — ONDANSETRON HCL 4 MG/2ML IJ SOLN: 4.0000 mg | Freq: Four times a day (QID) | INTRAMUSCULAR | Status: DC | PRN

## 2015-10-13 MED ORDER — MUPIROCIN 2 % EX OINT
1.0000 "application " | TOPICAL_OINTMENT | Freq: Two times a day (BID) | CUTANEOUS | Status: DC
  Administered 2015-10-13 – 2015-10-14 (×3): 1 via NASAL
  Filled 2015-10-13: qty 22

## 2015-10-13 MED ORDER — ONDANSETRON HCL 4 MG/2ML IJ SOLN
4.0000 mg | Freq: Four times a day (QID) | INTRAMUSCULAR | Status: DC | PRN
  Administered 2015-10-13 (×2): 4 mg via INTRAVENOUS
  Filled 2015-10-13 (×2): qty 2

## 2015-10-13 MED ORDER — DIPHENHYDRAMINE HCL 25 MG PO CAPS: 25.0000 mg | ORAL_CAPSULE | Freq: Three times a day (TID) | ORAL | Status: DC | PRN

## 2015-10-13 MED ORDER — HYDRALAZINE HCL 20 MG/ML IJ SOLN
10.0000 mg | INTRAMUSCULAR | Status: DC | PRN
  Administered 2015-10-13 (×2): 10 mg via INTRAVENOUS
  Filled 2015-10-13 (×2): qty 1

## 2015-10-13 MED ORDER — MORPHINE SULFATE (PF) 2 MG/ML IV SOLN
2.0000 mg | INTRAVENOUS | Status: DC | PRN
  Administered 2015-10-13 (×2): 2 mg via INTRAVENOUS
  Filled 2015-10-13 (×2): qty 1

## 2015-10-13 MED ORDER — ISOSORBIDE MONONITRATE ER 30 MG PO TB24
30.0000 mg | ORAL_TABLET | Freq: Every day | ORAL | Status: DC
  Administered 2015-10-13 – 2015-10-14 (×2): 30 mg via ORAL
  Filled 2015-10-13 (×2): qty 1

## 2015-10-13 MED ORDER — HYDRALAZINE HCL 20 MG/ML IJ SOLN: 10.0000 mg | INTRAMUSCULAR | Status: DC | PRN

## 2015-10-13 MED ORDER — LISINOPRIL 10 MG PO TABS: 40.0000 mg | ORAL_TABLET | Freq: Every day | ORAL | Status: DC

## 2015-10-13 MED ORDER — INSULIN DETEMIR 100 UNIT/ML ~~LOC~~ SOLN
5.0000 [IU] | Freq: Every day | SUBCUTANEOUS | Status: DC
  Administered 2015-10-13: 5 [IU] via SUBCUTANEOUS
  Filled 2015-10-13: qty 0.05

## 2015-10-13 MED ORDER — INSULIN ASPART 100 UNIT/ML ~~LOC~~ SOLN
0.0000 [IU] | Freq: Four times a day (QID) | SUBCUTANEOUS | Status: DC
  Administered 2015-10-13: 2 [IU] via SUBCUTANEOUS
  Administered 2015-10-13: 1 [IU] via SUBCUTANEOUS

## 2015-10-13 MED ORDER — NIFEDIPINE ER OSMOTIC RELEASE 90 MG PO TB24
90.0000 mg | ORAL_TABLET | Freq: Every day | ORAL | Status: DC
  Administered 2015-10-13 – 2015-10-14 (×2): 90 mg via ORAL
  Filled 2015-10-13 (×2): qty 1

## 2015-10-13 MED ORDER — COLCHICINE 0.6 MG PO TABS
0.6000 mg | ORAL_TABLET | Freq: Every day | ORAL | Status: DC
  Administered 2015-10-13 – 2015-10-14 (×2): 0.6 mg via ORAL
  Filled 2015-10-13 (×3): qty 1

## 2015-10-13 MED ORDER — LISINOPRIL 20 MG PO TABS
40.0000 mg | ORAL_TABLET | Freq: Every day | ORAL | Status: DC
  Administered 2015-10-14: 40 mg via ORAL
  Filled 2015-10-13 (×2): qty 2

## 2015-10-13 MED ORDER — INSULIN ASPART 100 UNIT/ML ~~LOC~~ SOLN
3.0000 [IU] | Freq: Three times a day (TID) | SUBCUTANEOUS | Status: DC
  Administered 2015-10-13 – 2015-10-14 (×3): 3 [IU] via SUBCUTANEOUS

## 2015-10-13 MED ORDER — INSULIN ASPART 100 UNIT/ML ~~LOC~~ SOLN
0.0000 [IU] | Freq: Three times a day (TID) | SUBCUTANEOUS | Status: DC
  Administered 2015-10-13 (×2): 2 [IU] via SUBCUTANEOUS
  Administered 2015-10-14: 3 [IU] via SUBCUTANEOUS

## 2015-10-13 MED ORDER — OXYCODONE-ACETAMINOPHEN 5-325 MG PO TABS
1.0000 | ORAL_TABLET | ORAL | Status: DC | PRN
  Administered 2015-10-13 (×2): 2 via ORAL
  Filled 2015-10-13 (×2): qty 2

## 2015-10-13 MED ORDER — HEPARIN SODIUM (PORCINE) 5000 UNIT/ML IJ SOLN
5000.0000 [IU] | Freq: Three times a day (TID) | INTRAMUSCULAR | Status: DC
  Administered 2015-10-13 – 2015-10-14 (×4): 5000 [IU] via SUBCUTANEOUS
  Filled 2015-10-13 (×4): qty 1

## 2015-10-13 MED ORDER — CHLORHEXIDINE GLUCONATE CLOTH 2 % EX PADS
6.0000 | MEDICATED_PAD | Freq: Every day | CUTANEOUS | Status: DC
  Administered 2015-10-13 – 2015-10-14 (×2): 6 via TOPICAL

## 2015-10-13 MED ORDER — CARVEDILOL 6.25 MG PO TABS
6.2500 mg | ORAL_TABLET | Freq: Two times a day (BID) | ORAL | Status: DC
  Administered 2015-10-13 – 2015-10-14 (×4): 6.25 mg via ORAL
  Filled 2015-10-13 (×7): qty 1

## 2015-10-13 MED ORDER — INFLUENZA VAC SPLIT QUAD 0.5 ML IM SUSY
0.5000 mL | PREFILLED_SYRINGE | INTRAMUSCULAR | Status: DC
Start: 2015-10-14 — End: 2015-10-14
  Filled 2015-10-13 (×2): qty 0.5

## 2015-10-13 NOTE — H&P (Signed)
[facility] History and Physical  Patient: Patrick Stewart  MRN: [id number]  DOB: Apr 30, 1956  DOS: the patient was seen and examined on 10/12/2015 PCP: Pcp Not In System  Referring physician: Dr. Zenia Resides Chief Complaint: right lower rib pain  HPI: Euclide Menna is a 59 y.o. male with Past medical history of coronary disease status post CABG gout, hypertension, diabetes mellitus, COPD, ischemic myopathy ejection fraction 15-20% May 16, BPH, pericarditis May 16 Patient presents with complaints of right lower rib pain. He mentions this pain has been ongoing for 3 days. Pain has been persistent and worsens with movement as well as taking a deep breath. Patient also worsens with pressing on the site. Patient denies any fall trauma or injury. He denies any fever or chills. He does of some cough which is not improving for last 1-2 month. He does not have any expectoration. Denies any weight loss or evening rise in temperature. Denies any travel outside of states. Denies any nausea or vomiting. He did have some diarrhea earlier today. Denies having any active bleeding. No burning urination no focal deficit. Patient does have chronic leg swelling. No leg tenderness. Patient was recently seen in the ER for gout involving right knee  The patient is coming from home.  At his baseline ambulates with support And is independent for most of his ADL; manages his medication on his own.  Review of Systems: as mentioned in the history of present illness.  A comprehensive review of the other systems is negative.  Past Medical History  Diagnosis Date  . Gout   . Hypertension   . Diabetes mellitus without complication ([place])   . MI (myocardial infarction) ([place])   . Chronic bronchitis [facility])    Past Surgical History  Procedure Laterality Date  . Coronary artery bypass graft    . Knee surgery    . Rotator cuff repair    . Wrist surgery     Social History:  reports that he has been smoking.   He does not have any smokeless tobacco history on file. He reports that he does not drink alcohol or use illicit drugs.  Allergies  Allergen Reactions  . Hydrocodone Itching    History reviewed. No pertinent family history.  Prior to Admission medications   Medication Sig Start Date End Date Taking? Authorizing Provider  albuterol (PROVENTIL HFA;VENTOLIN HFA) 108 (90 BASE) MCG/ACT inhaler Inhale 1-2 puffs into the lungs every 6 (six) hours as needed for wheezing or shortness of breath. 07/13/15  Yes [name], MD  albuterol (PROVENTIL) (2.5 MG/3ML) 0.083% nebulizer solution Take 2.5 mg by nebulization every 6 (six) hours as needed for wheezing or shortness of breath.   Yes Historical Provider, MD  aspirin 325 MG tablet Take 325 mg by mouth daily.   Yes Historical Provider, MD  atorvastatin (LIPITOR) 40 MG tablet Take 40 mg by mouth daily.   Yes Historical Provider, MD  indomethacin (INDOCIN) 25 MG capsule Take 1 capsule (25 mg total) by mouth 3 (three) times daily as needed. Patient taking differently: Take 25 mg by mouth 3 (three) times daily as needed (gout pain).  09/14/15  Yes Margarita Mail, PA-C  lisinopril (PRINIVIL,ZESTRIL) 40 MG tablet Take 40 mg by mouth daily. 10/04/15  Yes Historical Provider, MD  metFORMIN (GLUCOPHAGE) 1000 MG tablet Take 1,000 mg by mouth 2 (two) times daily as needed (high BS).    Yes Historical Provider, MD  NIFEdipine (PROCARDIA-XL/ADALAT CC) 60 MG 24 hr tablet Take 60 mg by  mouth daily. 09/30/15  Yes Historical Provider, MD  oxyCODONE-acetaminophen (PERCOCET) 5-325 MG per tablet Take 1-2 tablets by mouth every 4 (four) hours as needed. Patient taking differently: Take 1-2 tablets by mouth every 4 (four) hours as needed for moderate pain.  09/14/15  Yes Margarita Mail, PA-C  amoxicillin-clavulanate (AUGMENTIN) 875-125 MG per tablet Take 1 tablet by mouth 2 (two) times daily. One po bid x 7 days Patient not taking: Reported on 09/14/2015 07/17/15   April  Palumbo, MD  benzonatate (TESSALON) 100 MG capsule Take 1 capsule (100 mg total) by mouth every 8 (eight) hours. Patient not taking: Reported on 09/14/2015 07/17/15   [name], MD  dextromethorphan-guaiFENesin [facility] DM) 30-600 MG per 12 hr tablet Take 1 tablet by mouth 2 (two) times daily. Patient not taking: Reported on 09/14/2015 07/13/15   [name], MD    Physical Exam: Filed Vitals:   10/12/15 2347 10/13/15 0100 10/13/15 0157 10/13/15 0226  BP: 192/90 168/81  176/100  Pulse: 74 74    Temp:   98.5 F (36.9 C)   TempSrc:   Oral   Resp: 20 22    Height:   5\' 10"  (1.778 m)   Weight:   91.1 kg (200 lb 13.4 oz)   SpO2: 98% 96%      General: Alert, Awake and Oriented to Time, Place and Person. Appear in mild distress Eyes: PERRL ENT: Oral Mucosa clear moist. Neck: no JVD Cardiovascular: S1 and S2 Present, no Murmur, Peripheral Pulses Present Respiratory: Bilateral Air entry equal and Decreased,  Clear to Auscultation, no Crackles, no wheezes Abdomen: Bowel Sound present, Soft and no tenderness Skin: no Rash Extremities: bilateral Pedal edema, no calf tenderness Neurologic: Grossly no focal neuro deficit.  Labs on Admission:  CBC:  Recent Labs Lab 10/12/15 1720 10/13/15 0018  WBC 5.7 7.0  NEUTROABS 3.4 4.7  HGB 12.4* 11.4*  HCT 37.4* 34.5*  MCV 89.0 89.6  PLT 205 202    CMP     Component Value Date/Time   NA 137 10/13/2015 0018   K 4.3 10/13/2015 0018   CL 105 10/13/2015 0018   CO2 21* 10/13/2015 0018   GLUCOSE 165* 10/13/2015 0018   BUN 32* 10/13/2015 0018   CREATININE 1.59* 10/13/2015 0018   CALCIUM 8.5* 10/13/2015 0018   PROT 8.4* 10/13/2015 0018   ALBUMIN 3.6 10/13/2015 0018   AST 17 10/13/2015 0018   ALT 11* 10/13/2015 0018   ALKPHOS 54 10/13/2015 0018   BILITOT 0.7 10/13/2015 0018   GFRNONAA 46* 10/13/2015 0018   GFRAA 53* 10/13/2015 0018     Recent Labs Lab 10/12/15 1720 10/13/15 0018  TROPONINI 0.10* 0.10*  0.08*   BNP (last  3 results) No results for input(s): BNP in the last 8760 hours.  ProBNP (last 3 results) No results for input(s): PROBNP in the last 8760 hours.   Radiological Exams on Admission: Dg Chest 2 View  10/12/2015  CLINICAL DATA:  Chest discomfort along the right lateral chest beginning yesterday. History of myocardial infarction, hypertension and diabetes. History of previous CABG surgery. EXAM: CHEST  2 VIEW COMPARISON:  07/17/2015 FINDINGS: Changes from CABG surgery are stable. Cardiac silhouette is mildly enlarged. Aorta is uncoiled. No mediastinal or hilar masses or convincing adenopathy. Lungs are clear.  No pleural effusion or pneumothorax. Bony thorax is intact. IMPRESSION: 1. No acute cardiopulmonary disease. Electronically Signed   By: Lajean Manes M.D.   On: 10/12/2015 18:24   Nm Pulmonary Perf And Vent  10/12/2015  CLINICAL DATA:  Chest pain. EXAM: NUCLEAR MEDICINE VENTILATION - PERFUSION LUNG SCAN TECHNIQUE: Ventilation images were obtained in multiple projections using inhaled aerosol Tc-73m DTPA. Perfusion images were obtained in multiple projections after intravenous injection of Tc-25m MAA. RADIOPHARMACEUTICALS:  32.8 Technetium-24m DTPA aerosol inhalation and 3.2 Technetium-93m MAA IV COMPARISON:  Chest radiographs on 10/12/2015 FINDINGS: Ventilation: Heterogeneous distribution of activity in both lungs, however no focal ventilation defect identified. Perfusion: No wedge shaped peripheral perfusion defects to suggest acute pulmonary embolism. IMPRESSION: Very low probability for pulmonary embolism. Electronically Signed   By: Earle Gell M.D.   On: 10/12/2015 23:10   EKG: Independently reviewed. normal sinus rhythm, nonspecific ST and T waves changes.  Assessment/Plan 1. Chest pain Patient presents with complaints of right-sided lower rib pain. With history of pleuritic chest pain and elevated d-dimer of VQ scan was performed which was low probability for PE. Patient's EKG does  have changes concerning for inferior ischemia, discussed on phone with cardiology who compared today's EKG with earlier EKG in the past and recommended that there are no acute changes in his EKG to concern for acute ischemia. In May 2016 the patient presented in view with similar complaints, had inferior EKG changes, was diagnosed with pericarditis and had normal left heart cath. Patient's symptom onset is 3 days ago with persistent pain with minimal troponin elevation suggest ACS less likely. With this the patient will be admitted in the hospital due to ongoing chest pain. We will control his heart rate with Coreg, IV hydralazine, Imdur. Check UDS to rule out cocaine. Follow serial troponin check echocardiogram in the morning. Patient remains nothing by mouth except medication. Possibility of pericarditis highly likely with pleuritic chest pain and diffuse EKG changes. Continue with colchicine for gout as well as pericarditis. Continue with aspirin 325.  2. Accelerated hypertension. Blood pressure elevated. We will add Coreg hydralazine and Imdur and continue his home medications.  3 CKD (chronic kidney disease) Continue close monitoring of renal function. Avoid nephrotoxic medication.  4 Gout Low-dose colchicine.  5  Type 2 diabetes mellitus with chronic kidney disease ([place]) Placing him on sliding scale insulin. Most likely uncontrolled check hemoglobin A1c  6  Cardiomyopathy, ischemic, chronic systolic CHF At present does not appear to be volume overloaded. We'll continue close monitoring. Patient currently not taking any diuretic.  Nutrition: nothing by mouth except medication DVT Prophylaxis: subcutaneous Heparin  Advance goals of care discussion: full code   Disposition: Admitted as observation step-down unit.  Author: Berle Mull, MD [facility] Pager: [phone] 10/12/2015  If 7PM-7AM, please contact night-coverage [email or web address] Password [email or web address]

## 2015-10-13 NOTE — Progress Notes (Signed)
[facility] PROGRESS NOTE    Progress Note   Winner Courtwright [id number] DOB: 05/15/1956 DOA: 10/12/2015 PCP: Pcp Not In System   Brief Narrative:   Sota Beckum is an 59 y.o. male multiple risk factors for coronary artery sees that comes in for right upper quadrant and right lower quadrant pain  Assessment/Plan:   Atypical Chest pain - His pain is more in the right upper quadrant and right lower quadrant, reproducible by palpation Murphy sign negative. - Cardiac biomarkers were mildly elevated continue to cycle cardiac biomarkers, 2-D echo is pending, to rule out wall motion abnormality. - Check a CT scan of the abdomen and pelvis without IV contrast to rule out stones. He hastrace of blood on UA. - Transfer him to telemetry bed. - EKG was discussed with cardiology over the phone who compared to previous EKG and they recommended there were no acute changes concerning for ACS.  CKD (chronic kidney disease) - Creatinine at baseline continue to monitor.  History of  Gout  Type 2 diabetes mellitus with chronic kidney disease (HCC) - Seminal, modified diet, continue her metformin start Levemir 5 units daily plus sliding scale insulin.  Accelerated hypertension: - Blood pressures improve once his home medications were restarted.  - Currently not at goal titrate Procardia.    CAD (coronary artery disease) Status post CABG 2 SVG to OM 1, LIMA to mid LAD: - Continue aspirin and beta blockers.  Cardiomyopathy, ischemic, chronic systolic CHF: - 2-D echo is pending he currently seems compensated.     DVT Prophylaxis - Heparin ordered.  Family Communication: none Disposition Plan: Home when stable. Code Status:     Code Status Orders        Start     Ordered   10/13/15 0047  Full code   Continuous     10/13/15 0048        IV Access:    Peripheral IV   Procedures and diagnostic studies:   Dg Chest 2 View  10/12/2015  CLINICAL DATA:  Chest discomfort  along the right lateral chest beginning yesterday. History of myocardial infarction, hypertension and diabetes. History of previous CABG surgery. EXAM: CHEST  2 VIEW COMPARISON:  07/17/2015 FINDINGS: Changes from CABG surgery are stable. Cardiac silhouette is mildly enlarged. Aorta is uncoiled. No mediastinal or hilar masses or convincing adenopathy. Lungs are clear.  No pleural effusion or pneumothorax. Bony thorax is intact. IMPRESSION: 1. No acute cardiopulmonary disease. Electronically Signed   By: Lajean Manes M.D.   On: 10/12/2015 18:24   Nm Pulmonary Perf And Vent  10/12/2015  CLINICAL DATA:  Chest pain. EXAM: NUCLEAR MEDICINE VENTILATION - PERFUSION LUNG SCAN TECHNIQUE: Ventilation images were obtained in multiple projections using inhaled aerosol Tc-33m DTPA. Perfusion images were obtained in multiple projections after intravenous injection of Tc-54m MAA. RADIOPHARMACEUTICALS:  32.8 Technetium-63m DTPA aerosol inhalation and 3.2 Technetium-15m MAA IV COMPARISON:  Chest radiographs on 10/12/2015 FINDINGS: Ventilation: Heterogeneous distribution of activity in both lungs, however no focal ventilation defect identified. Perfusion: No wedge shaped peripheral perfusion defects to suggest acute pulmonary embolism. IMPRESSION: Very low probability for pulmonary embolism. Electronically Signed   By: Earle Gell M.D.   On: 10/12/2015 23:10     Medical Consultants:    None.  Anti-Infectives:   Anti-infectives    None      Subjective:    [name]  he relates he continues to have right upper quadrant and right lower quadrant pain reproducible by palpation  Objective:    Filed Vitals:   10/13/15 0600 10/13/15 0644 10/13/15 0648 10/13/15 0700  BP: 142/90 185/92 185/92 139/75  Pulse: 63 76  69  Temp:      TempSrc:      Resp: 18 28  23   Height:      Weight:      SpO2: 99% 100%  99%   No intake or output data in the 24 hours ending 10/13/15 0731 Filed Weights   10/13/15 0157    Weight: 91.1 kg (200 lb 13.4 oz)    Exam: Gen:  NAD Cardiovascular:  RRR, No M/R/G Chest and lungs:   CTAB Abdomen:  Abdomen soft, right upper quadrant tenderness and right lower quadrant tenderness Murphy sign negative no rebound or guarding  Extremities:  No C/E/C   Data Reviewed:    Labs: Basic Metabolic Panel:  Recent Labs Lab 10/12/15 1720 10/13/15 0018  NA 136 137  K 4.2 4.3  CL 103 105  CO2 27 21*  GLUCOSE 153* 165*  BUN 36* 32*  CREATININE 1.86* 1.59*  CALCIUM 8.8* 8.5*   GFR Estimated Creatinine Clearance: 56 mL/min (by C-G formula based on Cr of 1.59). Liver Function Tests:  Recent Labs Lab 10/12/15 1720 10/13/15 0018  AST 18 17  ALT 14* 11*  ALKPHOS 61 54  BILITOT 1.0 0.7  PROT 9.3* 8.4*  ALBUMIN 4.0 3.6   No results for input(s): LIPASE, AMYLASE in the last 168 hours. No results for input(s): AMMONIA in the last 168 hours. Coagulation profile  Recent Labs Lab 10/13/15 0018  INR 1.17    CBC:  Recent Labs Lab 10/12/15 1720 10/13/15 0018  WBC 5.7 7.0  NEUTROABS 3.4 4.7  HGB 12.4* 11.4*  HCT 37.4* 34.5*  MCV 89.0 89.6  PLT 205 202   Cardiac Enzymes:  Recent Labs Lab 10/12/15 1720 10/13/15 0018  TROPONINI 0.10* 0.10*  0.08*   BNP (last 3 results) No results for input(s): PROBNP in the last 8760 hours. CBG:  Recent Labs Lab 10/13/15 0122 10/13/15 0148  GLUCAP 165* 153*   D-Dimer:  Recent Labs  10/12/15 1748  DDIMER 1.56*   Hgb A1c: No results for input(s): HGBA1C in the last 72 hours. Lipid Profile: No results for input(s): CHOL, HDL, LDLCALC, TRIG, CHOLHDL, LDLDIRECT in the last 72 hours. Thyroid function studies: No results for input(s): TSH, T4TOTAL, T3FREE, THYROIDAB in the last 72 hours.  Invalid input(s): FREET3 Anemia work up: No results for input(s): VITAMINB12, FOLATE, FERRITIN, TIBC, IRON, RETICCTPCT in the last 72 hours. Sepsis Labs:  Recent Labs Lab 10/12/15 1720 10/13/15 0018  WBC 5.7  7.0   Microbiology Recent Results (from the past 240 hour(s))  MRSA PCR Screening     Status: Abnormal   Collection Time: 10/13/15  1:42 AM  Result Value Ref Range Status   MRSA by PCR POSITIVE (A) NEGATIVE Final    Comment:        The GeneXpert MRSA Assay (FDA approved for NASAL specimens only), is one component of a comprehensive MRSA colonization surveillance program. It is not intended to diagnose MRSA infection nor to guide or monitor treatment for MRSA infections. RESULT CALLED TO, READ BACK BY AND VERIFIED WITH: DENNY,C/2W @0546  ON 10/13/15 BY KARCZEWSKI,S.      Medications:   . aspirin  325 mg Oral Daily  . atorvastatin  40 mg Oral q1800  . carvedilol  6.25 mg Oral BID WC  . Chlorhexidine Gluconate Cloth  6 each Topical Daily  .  colchicine  0.6 mg Oral Daily  . heparin  5,000 Units Subcutaneous 3 times per day  . insulin aspart  0-9 Units Subcutaneous Q6H  . isosorbide mononitrate  30 mg Oral Daily  . mupirocin ointment  1 application Nasal BID  . NIFEdipine  60 mg Oral Daily   Continuous Infusions:   Time spent: 25 min     Charlynne Cousins  [facility] Pager [phone]  *Please refer to [email or web address], password [email or web address] to get updated schedule on who will round on this patient, as hospitalists switch teams weekly. If 7PM-7AM, please contact night-coverage at [email or web address], password [email or web address] for any overnight needs.  10/13/2015, 7:31 AM

## 2015-10-13 NOTE — Progress Notes (Signed)
  Echocardiogram 2D Echocardiogram has been performed.  Patrick Stewart 10/13/2015, 8:39 AM

## 2015-10-13 NOTE — ED Notes (Signed)
Lights dimmed for comfort. Pt denies further needs.  

## 2015-10-14 DIAGNOSIS — N201 Calculus of ureter: Secondary | ICD-10-CM

## 2015-10-14 DIAGNOSIS — I255 Ischemic cardiomyopathy: Secondary | ICD-10-CM | POA: Diagnosis not present

## 2015-10-14 DIAGNOSIS — R0789 Other chest pain: Secondary | ICD-10-CM | POA: Diagnosis not present

## 2015-10-14 DIAGNOSIS — I10 Essential (primary) hypertension: Secondary | ICD-10-CM | POA: Diagnosis not present

## 2015-10-14 LAB — HEMOGLOBIN A1C
Hgb A1c MFr Bld: 7.8 % — ABNORMAL HIGH (ref 4.8–5.6)
Mean Plasma Glucose: 177 mg/dL

## 2015-10-14 LAB — GLUCOSE, CAPILLARY
GLUCOSE-CAPILLARY: 108 mg/dL — AB (ref 65–99)
Glucose-Capillary: 159 mg/dL — ABNORMAL HIGH (ref 65–99)

## 2015-10-14 MED ORDER — OXYCODONE-ACETAMINOPHEN 5-325 MG PO TABS: 1.0000 | ORAL_TABLET | ORAL | Status: DC | PRN

## 2015-10-14 MED ORDER — CARVEDILOL 6.25 MG PO TABS: 6.2500 mg | ORAL_TABLET | Freq: Two times a day (BID) | ORAL | Status: DC

## 2015-10-14 MED ORDER — ISOSORBIDE MONONITRATE ER 30 MG PO TB24: 30.0000 mg | ORAL_TABLET | Freq: Every day | ORAL | Status: DC

## 2015-10-14 MED ORDER — TAMSULOSIN HCL 0.4 MG PO CAPS: 0.4000 mg | ORAL_CAPSULE | Freq: Every day | ORAL | Status: DC

## 2015-10-14 NOTE — Discharge Summary (Signed)
Physician Discharge Summary  Patrick Stewart [id number] DOB: 02-Dec-1956 DOA: 10/12/2015  PCP: Pcp Not In System  Admit date: 10/12/2015 Discharge date: 10/14/2015  Time spent: 35 minutes  Recommendations for Outpatient Follow-up:  1. Follow-up with primary care doctor as an outpatient , can consider transitioning him from metformin to INSULIN. 2. We'll need a basic metabolic panel. 3. Patient has been instructed to take his stone once it passed to his primary care physician.  Discharge Diagnoses:  Principal Problem:   Chest pain Active Problems:   CKD (chronic kidney disease)   Gout   Type 2 diabetes mellitus with chronic kidney disease (HCC)   Accelerated hypertension   Abnormal EKG   CAD Status post CABG 2 SVG to OM 1, LIMA to mid LAD:   Cardiomyopathy, ischemic, chronic systolic CHF   Discharge Condition: stable  Diet recommendation: Carb modified  Filed Weights   10/13/15 0157  Weight: 91.1 kg (200 lb 13.4 oz)    History of present illness:  C past medical history of coronary artery sees ischemic cardiomyopathy with EF 15-20% that comes in complaining of right lower pain and right lower quadrant pain  Hospital Course:  Abdominal right upper quadrant pain and left leg pain: Likely due to urethral stone. Cardiac biomarkers were cycled which were mildly elevated: Back to his records to take his enzymes have always been ranging from 0.9-0.7. A 2-D echo was done that showed no wall motion abnormality but addition of an improved ejection fraction. EKG was unchanged from previous. CT scan of the abdomen and pelvis without contrast was done that showed a 2 mm stone in the right ureter. He was started on Flomax and narcotics and IV fluids, he is to take the stone to his primary care doctor once this past.  Chronic kidney disease: Currently his baseline no changes were made.  History of gout:  Eevated hypertension: Blood pressure was improved once he was restarted on  his blood pressure medication. Follow-up with his primary care doctor and titrate as tolerated.  Chronic systolic heart failure/ischemic cardiomyopathy with an EF of 30%: 2-D echo was done that showed an improved ejection fraction he seems to be compensated, he was restarted on his Coreg and his Imdur was titrated. He'll follow-up with his cardiologist and titrate as tolerated.   Procedures:  CT abdomen and pelvis  2-D echo  Consultations:  none  Discharge Exam: Filed Vitals:   10/14/15 0512  BP: 125/63  Pulse: 64  Temp: 98.2 F (36.8 C)  Resp: 21    General: A&O x3 Cardiovascular: RRR Respiratory: good air movement CTA B/L  Discharge Instructions   Discharge Instructions    Diet - low sodium heart healthy    Complete by:  As directed      Increase activity slowly    Complete by:  As directed           Current Discharge Medication List    START taking these medications   Details  carvedilol (COREG) 6.25 MG tablet Take 1 tablet (6.25 mg total) by mouth 2 (two) times daily with a meal. Qty: 60 tablet, Refills: 3    isosorbide mononitrate (IMDUR) 30 MG 24 hr tablet Take 1 tablet (30 mg total) by mouth daily. Qty: 30 tablet, Refills: 3    tamsulosin (FLOMAX) 0.4 MG CAPS capsule Take 1 capsule (0.4 mg total) by mouth daily after supper. Qty: 30 capsule, Refills: 0      CONTINUE these medications which have CHANGED  Details  oxyCODONE-acetaminophen (PERCOCET) 5-325 MG tablet Take 1-2 tablets by mouth every 4 (four) hours as needed for moderate pain. Qty: 20 tablet, Refills: 0      CONTINUE these medications which have NOT CHANGED   Details  albuterol (PROVENTIL HFA;VENTOLIN HFA) 108 (90 BASE) MCG/ACT inhaler Inhale 1-2 puffs into the lungs every 6 (six) hours as needed for wheezing or shortness of breath. Qty: 1 Inhaler, Refills: 0    albuterol (PROVENTIL) (2.5 MG/3ML) 0.083% nebulizer solution Take 2.5 mg by nebulization every 6 (six) hours as needed for  wheezing or shortness of breath.    aspirin 325 MG tablet Take 325 mg by mouth daily.    atorvastatin (LIPITOR) 40 MG tablet Take 40 mg by mouth daily.    lisinopril (PRINIVIL,ZESTRIL) 40 MG tablet Take 40 mg by mouth daily. Refills: 11    metFORMIN (GLUCOPHAGE) 1000 MG tablet Take 1,000 mg by mouth 2 (two) times daily as needed (high BS).     NIFEdipine (PROCARDIA-XL/ADALAT CC) 60 MG 24 hr tablet Take 60 mg by mouth daily. Refills: 9    benzonatate (TESSALON) 100 MG capsule Take 1 capsule (100 mg total) by mouth every 8 (eight) hours. Qty: 21 capsule, Refills: 0    dextromethorphan-guaiFENesin (MUCINEX DM) 30-600 MG per 12 hr tablet Take 1 tablet by mouth 2 (two) times daily. Qty: 14 tablet, Refills: 1      STOP taking these medications     indomethacin (INDOCIN) 25 MG capsule      amoxicillin-clavulanate (AUGMENTIN) 875-125 MG per tablet        Allergies  Allergen Reactions  . Hydrocodone Itching      The results of significant diagnostics from this hospitalization (including imaging, microbiology, ancillary and laboratory) are listed below for reference.    Significant Diagnostic Studies: Dg Chest 2 View  10/12/2015  CLINICAL DATA:  Chest discomfort along the right lateral chest beginning yesterday. History of myocardial infarction, hypertension and diabetes. History of previous CABG surgery. EXAM: CHEST  2 VIEW COMPARISON:  07/17/2015 FINDINGS: Changes from CABG surgery are stable. Cardiac silhouette is mildly enlarged. Aorta is uncoiled. No mediastinal or hilar masses or convincing adenopathy. Lungs are clear.  No pleural effusion or pneumothorax. Bony thorax is intact. IMPRESSION: 1. No acute cardiopulmonary disease. Electronically Signed   By: Lajean Manes M.D.   On: 10/12/2015 18:24   Nm Pulmonary Perf And Vent  10/12/2015  CLINICAL DATA:  Chest pain. EXAM: NUCLEAR MEDICINE VENTILATION - PERFUSION LUNG SCAN TECHNIQUE: Ventilation images were obtained in multiple  projections using inhaled aerosol Tc-11m DTPA. Perfusion images were obtained in multiple projections after intravenous injection of Tc-41m MAA. RADIOPHARMACEUTICALS:  32.8 Technetium-51m DTPA aerosol inhalation and 3.2 Technetium-56m MAA IV COMPARISON:  Chest radiographs on 10/12/2015 FINDINGS: Ventilation: Heterogeneous distribution of activity in both lungs, however no focal ventilation defect identified. Perfusion: No wedge shaped peripheral perfusion defects to suggest acute pulmonary embolism. IMPRESSION: Very low probability for pulmonary embolism. Electronically Signed   By: Earle Gell M.D.   On: 10/12/2015 23:10   Dg Knee Complete 4 Views Right  09/14/2015  CLINICAL DATA:  History of gout.  Right knee pain. EXAM: RIGHT KNEE - COMPLETE 4+ VIEW COMPARISON:  None. FINDINGS: There is no acute fracture or dislocation. There is mild tricompartmental osteoarthritis of the right knee. There is a large joint effusion. There is chondrocalcinosis of the medial and lateral femorotibial compartments as can be seen with CPPD. IMPRESSION: 1. No acute osseous injury of the  right knee. 2. Large joint effusion. 3. Mild tricompartmental osteoarthritis of the right knee. Electronically Signed   By: Kathreen Devoid   On: 09/14/2015 14:02   Ct Renal Stone Study  10/13/2015  CLINICAL DATA:  Right flank pain EXAM: CT ABDOMEN AND PELVIS WITHOUT CONTRAST TECHNIQUE: Multidetector CT imaging of the abdomen and pelvis was performed following the standard protocol without IV contrast. COMPARISON:  None. FINDINGS: Lower chest:  Lung bases clear.  Cardiac enlargement. Hepatobiliary: 12 mm cyst in the right lobe of the liver laterally. No other liver lesion. Gallbladder and bile ducts normal. Pancreas: Negative Spleen: Negative Adrenals/Urinary Tract: 2 mm nonobstructing stone left lower pole. No renal mass. No other renal calculi. No ureteral stone or obstruction. Urinary bladder normal. Stomach/Bowel: Negative for bowel obstruction.  No bowel edema. Negative for diverticulitis. Normal appendix. Vascular/Lymphatic: Mild atherosclerotic disease in the abdominal aorta. Abdominal aorta measures up to 2.6 cm in diameter. No aneurysm. Iliac artery calcification. No adenopathy. Reproductive: Mild to moderate prostate enlargement. Other: No free fluid. Musculoskeletal: No acute skeletal abnormality. Mild lumbar disc degeneration IMPRESSION: 2 mm nonobstructing stone left lower pole. No renal obstruction. No cause for right flank pain Normal appendix Electronically Signed   By: Franchot Gallo M.D.   On: 10/13/2015 09:35    Microbiology: Recent Results (from the past 240 hour(s))  MRSA PCR Screening     Status: Abnormal   Collection Time: 10/13/15  1:42 AM  Result Value Ref Range Status   MRSA by PCR POSITIVE (A) NEGATIVE Final    Comment:        The GeneXpert MRSA Assay (FDA approved for NASAL specimens only), is one component of a comprehensive MRSA colonization surveillance program. It is not intended to diagnose MRSA infection nor to guide or monitor treatment for MRSA infections. RESULT CALLED TO, READ BACK BY AND VERIFIED WITH: DENNY,C/2W @0546  ON 10/13/15 BY KARCZEWSKI,S.      Labs: Basic Metabolic Panel:  Recent Labs Lab 10/12/15 1720 10/13/15 0018  NA 136 137  K 4.2 4.3  CL 103 105  CO2 27 21*  GLUCOSE 153* 165*  BUN 36* 32*  CREATININE 1.86* 1.59*  CALCIUM 8.8* 8.5*   Liver Function Tests:  Recent Labs Lab 10/12/15 1720 10/13/15 0018  AST 18 17  ALT 14* 11*  ALKPHOS 61 54  BILITOT 1.0 0.7  PROT 9.3* 8.4*  ALBUMIN 4.0 3.6   No results for input(s): LIPASE, AMYLASE in the last 168 hours. No results for input(s): AMMONIA in the last 168 hours. CBC:  Recent Labs Lab 10/12/15 1720 10/13/15 0018  WBC 5.7 7.0  NEUTROABS 3.4 4.7  HGB 12.4* 11.4*  HCT 37.4* 34.5*  MCV 89.0 89.6  PLT 205 202   Cardiac Enzymes:  Recent Labs Lab 10/12/15 1720 10/13/15 0018 10/13/15 0708 10/13/15 1228   TROPONINI 0.10* 0.10*  0.08* 0.09* 0.09*   BNP: BNP (last 3 results) No results for input(s): BNP in the last 8760 hours.  ProBNP (last 3 results) No results for input(s): PROBNP in the last 8760 hours.  CBG:  Recent Labs Lab 10/13/15 0622 10/13/15 1206 10/13/15 1655 10/13/15 2054 10/14/15 0735  GLUCAP 134* 132* 135* 139* 159*       Signed:  Charlynne Cousins  [facility] 10/14/2015, 8:29 AM

## 2015-10-25 DIAGNOSIS — N200 Calculus of kidney: Secondary | ICD-10-CM

## 2015-10-25 DIAGNOSIS — Z0000 Encounter for general adult medical examination without abnormal findings: Secondary | ICD-10-CM | POA: Diagnosis not present

## 2015-10-25 DIAGNOSIS — I25119 Atherosclerotic heart disease of native coronary artery with unspecified angina pectoris: Secondary | ICD-10-CM | POA: Diagnosis not present

## 2015-10-25 DIAGNOSIS — E119 Type 2 diabetes mellitus without complications: Secondary | ICD-10-CM | POA: Diagnosis not present

## 2015-10-25 DIAGNOSIS — N183 Chronic kidney disease, stage 3 (moderate): Secondary | ICD-10-CM | POA: Diagnosis not present

## 2015-10-25 DIAGNOSIS — I10 Essential (primary) hypertension: Secondary | ICD-10-CM | POA: Diagnosis not present

## 2015-10-25 HISTORY — DX: Calculus of kidney: N20.0

## 2015-11-29 ENCOUNTER — Emergency Department (HOSPITAL_COMMUNITY)
Admission: EM | Admit: 2015-11-29 | Discharge: 2015-11-29 | Disposition: A | Discharge status: Home / Self Care | Payer: Medicare Other | Attending: Emergency Medicine | Admitting: Emergency Medicine

## 2015-11-29 ENCOUNTER — Encounter (HOSPITAL_COMMUNITY): Payer: Self-pay | Admitting: Emergency Medicine

## 2015-11-29 ENCOUNTER — Emergency Department (HOSPITAL_COMMUNITY): Payer: Medicare Other

## 2015-11-29 DIAGNOSIS — M109 Gout, unspecified: Secondary | ICD-10-CM | POA: Diagnosis not present

## 2015-11-29 DIAGNOSIS — E119 Type 2 diabetes mellitus without complications: Secondary | ICD-10-CM | POA: Insufficient documentation

## 2015-11-29 DIAGNOSIS — M10061 Idiopathic gout, right knee: Secondary | ICD-10-CM | POA: Insufficient documentation

## 2015-11-29 DIAGNOSIS — I252 Old myocardial infarction: Secondary | ICD-10-CM | POA: Diagnosis not present

## 2015-11-29 DIAGNOSIS — F17200 Nicotine dependence, unspecified, uncomplicated: Secondary | ICD-10-CM | POA: Insufficient documentation

## 2015-11-29 DIAGNOSIS — Z79899 Other long term (current) drug therapy: Secondary | ICD-10-CM | POA: Diagnosis not present

## 2015-11-29 DIAGNOSIS — Z8709 Personal history of other diseases of the respiratory system: Secondary | ICD-10-CM | POA: Diagnosis not present

## 2015-11-29 DIAGNOSIS — Z7982 Long term (current) use of aspirin: Secondary | ICD-10-CM | POA: Diagnosis not present

## 2015-11-29 DIAGNOSIS — I10 Essential (primary) hypertension: Secondary | ICD-10-CM | POA: Insufficient documentation

## 2015-11-29 DIAGNOSIS — Z951 Presence of aortocoronary bypass graft: Secondary | ICD-10-CM | POA: Diagnosis not present

## 2015-11-29 MED ORDER — OXYCODONE-ACETAMINOPHEN 5-325 MG PO TABS: 1.0000 | ORAL_TABLET | ORAL | Status: DC | PRN

## 2015-11-29 MED ORDER — OXYCODONE-ACETAMINOPHEN 5-325 MG PO TABS
1.0000 | ORAL_TABLET | Freq: Once | ORAL | Status: AC
  Administered 2015-11-29: 1 via ORAL
  Filled 2015-11-29: qty 1

## 2015-11-29 MED ORDER — INDOMETHACIN 25 MG PO CAPS: 25.0000 mg | ORAL_CAPSULE | Freq: Three times a day (TID) | ORAL | Status: DC | PRN

## 2015-11-29 MED ORDER — PREDNISONE 20 MG PO TABS: 40.0000 mg | ORAL_TABLET | Freq: Every day | ORAL | Status: DC

## 2015-11-29 MED ORDER — PREDNISONE 20 MG PO TABS
60.0000 mg | ORAL_TABLET | Freq: Once | ORAL | Status: AC
  Administered 2015-11-29: 60 mg via ORAL
  Filled 2015-11-29: qty 3

## 2015-11-29 NOTE — Discharge Instructions (Signed)
1. Medications: percocet, prednisone, indomethacin, usual home medications 2. Treatment: rest, drink plenty of fluids 3. Follow Up: please followup with your primary doctor for discussion of your diagnoses and further evaluation after today's visit; if you do not have a primary care doctor use the resource guide provided to find one; please return to the ER for high fever, severe pain, numbness, weakness, new or worsening symptoms   Gout Gout is an inflammatory arthritis caused by a buildup of uric acid crystals in the joints. Uric acid is a chemical that is normally present in the blood. When the level of uric acid in the blood is too high it can form crystals that deposit in your joints and tissues. This causes joint redness, soreness, and swelling (inflammation). Repeat attacks are common. Over time, uric acid crystals can form into masses (tophi) near a joint, destroying bone and causing disfigurement. Gout is treatable and often preventable. CAUSES  The disease begins with elevated levels of uric acid in the blood. Uric acid is produced by your body when it breaks down a naturally found substance called purines. Certain foods you eat, such as meats and fish, contain high amounts of purines. Causes of an elevated uric acid level include:  Being passed down from parent to child (heredity).  Diseases that cause increased uric acid production (such as obesity, psoriasis, and certain cancers).  Excessive alcohol use.  Diet, especially diets rich in meat and seafood.  Medicines, including certain cancer-fighting medicines (chemotherapy), water pills (diuretics), and aspirin.  Chronic kidney disease. The kidneys are no longer able to remove uric acid well.  Problems with metabolism. Conditions strongly associated with gout include:  Obesity.  High blood pressure.  High cholesterol.  Diabetes. Not everyone with elevated uric acid levels gets gout. It is not understood why some people get  gout and others do not. Surgery, joint injury, and eating too much of certain foods are some of the factors that can lead to gout attacks. SYMPTOMS   An attack of gout comes on quickly. It causes intense pain with redness, swelling, and warmth in a joint.  Fever can occur.  Often, only one joint is involved. Certain joints are more commonly involved:  Base of the big toe.  Knee.  Ankle.  Wrist.  Finger. Without treatment, an attack usually goes away in a few days to weeks. Between attacks, you usually will not have symptoms, which is different from many other forms of arthritis. DIAGNOSIS  Your caregiver will suspect gout based on your symptoms and exam. In some cases, tests may be recommended. The tests may include:  Blood tests.  Urine tests.  X-rays.  Joint fluid exam. This exam requires a needle to remove fluid from the joint (arthrocentesis). Using a microscope, gout is confirmed when uric acid crystals are seen in the joint fluid. TREATMENT  There are two phases to gout treatment: treating the sudden onset (acute) attack and preventing attacks (prophylaxis).  Treatment of an Acute Attack.  Medicines are used. These include anti-inflammatory medicines or steroid medicines.  An injection of steroid medicine into the affected joint is sometimes necessary.  The painful joint is rested. Movement can worsen the arthritis.  You may use warm or cold treatments on painful joints, depending which works best for you.  Treatment to Prevent Attacks.  If you suffer from frequent gout attacks, your caregiver may advise preventive medicine. These medicines are started after the acute attack subsides. These medicines either help your kidneys eliminate uric  acid from your body or decrease your uric acid production. You may need to stay on these medicines for a very long time.  The early phase of treatment with preventive medicine can be associated with an increase in acute gout  attacks. For this reason, during the first few months of treatment, your caregiver may also advise you to take medicines usually used for acute gout treatment. Be sure you understand your caregiver's directions. Your caregiver may make several adjustments to your medicine dose before these medicines are effective.  Discuss dietary treatment with your caregiver or dietitian. Alcohol and drinks high in sugar and fructose and foods such as meat, poultry, and seafood can increase uric acid levels. Your caregiver or dietitian can advise you on drinks and foods that should be limited. HOME CARE INSTRUCTIONS   Do not take aspirin to relieve pain. This raises uric acid levels.  Only take over-the-counter or prescription medicines for pain, discomfort, or fever as directed by your caregiver.  Rest the joint as much as possible. When in bed, keep sheets and blankets off painful areas.  Keep the affected joint raised (elevated).  Apply warm or cold treatments to painful joints. Use of warm or cold treatments depends on which works best for you.  Use crutches if the painful joint is in your leg.  Drink enough fluids to keep your urine clear or pale yellow. This helps your body get rid of uric acid. Limit alcohol, sugary drinks, and fructose drinks.  Follow your dietary instructions. Pay careful attention to the amount of protein you eat. Your daily diet should emphasize fruits, vegetables, whole grains, and fat-free or low-fat milk products. Discuss the use of coffee, vitamin C, and cherries with your caregiver or dietitian. These may be helpful in lowering uric acid levels.  Maintain a healthy body weight. SEEK MEDICAL CARE IF:   You develop diarrhea, vomiting, or any side effects from medicines.  You do not feel better in 24 hours, or you are getting worse. SEEK IMMEDIATE MEDICAL CARE IF:   Your joint becomes suddenly more tender, and you have chills or a fever. MAKE SURE YOU:   Understand  these instructions.  Will watch your condition.  Will get help right away if you are not doing well or get worse.   This information is not intended to replace advice given to you by your health care provider. Make sure you discuss any questions you have with your health care provider.   Document Released: 12/11/2000 Document Revised: 01/04/2015 Document Reviewed: 07/27/2012 Elsevier Interactive Patient Education 2016 [place] are compounds that affect the level of uric acid in your body. A low-purine diet is a diet that is low in purines. Eating a low-purine diet can prevent the level of uric acid in your body from getting too high and causing gout or kidney stones or both. WHAT DO I NEED TO KNOW ABOUT THIS DIET?  Choose low-purine foods. Examples of low-purine foods are listed in the next section.  Drink plenty of fluids, especially water. Fluids can help remove uric acid from your body. Try to drink 8-16 cups (1.9-3.8 L) a day.  Limit foods high in fat, especially saturated fat, as fat makes it harder for the body to get rid of uric acid. Foods high in saturated fat include pizza, cheese, ice cream, whole milk, fried foods, and gravies. Choose foods that are lower in fat and lean sources of protein. Use olive oil when cooking as  it contains healthy fats that are not high in saturated fat.  Limit alcohol. Alcohol interferes with the elimination of uric acid from your body. If you are having a gout attack, avoid all alcohol.  Keep in mind that different people's bodies react differently to different foods. You will probably learn over time which foods do or do not affect you. If you discover that a food tends to cause your gout to flare up, avoid eating that food. You can more freely enjoy foods that do not cause problems. If you have any questions about a food item, talk to your dietitian or health care provider. WHICH FOODS ARE LOW, MODERATE, AND HIGH IN  PURINES? The following is a list of foods that are low, moderate, and high in purines. You can eat any amount of the foods that are low in purines. You may be able to have small amounts of foods that are moderate in purines. Ask your health care provider how much of a food moderate in purines you can have. Avoid foods high in purines. Grains  Foods low in purines: Enriched white bread, pasta, rice, cake, cornbread, popcorn.  Foods moderate in purines: Whole-grain breads and cereals, wheat germ, bran, oatmeal. Uncooked oatmeal. Dry wheat bran or wheat germ.  Foods high in purines: Pancakes, [place] toast, biscuits, muffins. Vegetables  Foods low in purines: All vegetables, except those that are moderate in purines.  Foods moderate in purines: Asparagus, cauliflower, spinach, mushrooms, green peas. Fruits  All fruits are low in purines. Meats and other Protein Foods  Foods low in purines: Eggs, nuts, peanut butter.  Foods moderate in purines: 80-90% lean beef, lamb, veal, pork, poultry, fish, eggs, peanut butter, nuts. Crab, lobster, oysters, and shrimp. Cooked dried beans, peas, and lentils.  Foods high in purines: Anchovies, sardines, herring, mussels, tuna, codfish, scallops, trout, and haddock. Berniece Salines. Organ meats (such as liver or kidney). Tripe. Game meat. Goose. Sweetbreads. Dairy  All dairy foods are low in purines. Low-fat and fat-free dairy products are best because they are low in saturated fat. Beverages  Drinks low in purines: Water, carbonated beverages, tea, coffee, cocoa.  Drinks moderate in purines: Soft drinks and other drinks sweetened with high-fructose corn syrup. Juices. To find whether a food or drink is sweetened with high-fructose corn syrup, look at the ingredients list.  Drinks high in purines: Alcoholic beverages (such as beer). Condiments  Foods low in purines: Salt, herbs, olives, pickles, relishes, vinegar.  Foods moderate in purines: Butter,  margarine, oils, mayonnaise. Fats and Oils  Foods low in purines: All types, except gravies and sauces made with meat.  Foods high in purines: Gravies and sauces made with meat. Other Foods  Foods low in purines: Sugars, sweets, gelatin. Cake. Soups made without meat.  Foods moderate in purines: Meat-based or fish-based soups, broths, or bouillons. Foods and drinks sweetened with high-fructose corn syrup.  Foods high in purines: High-fat desserts (such as ice cream, cookies, cakes, pies, doughnuts, and chocolate). Contact your dietitian for more information on foods that are not listed here.   This information is not intended to replace advice given to you by your health care provider. Make sure you discuss any questions you have with your health care provider.   Document Released: 04/10/2011 Document Revised: 12/19/2013 Document Reviewed: 11/20/2013 Elsevier Interactive Patient Education 2016 [employer].    Emergency Department Resource Guide 1) Find a Doctor and Pay Out of Pocket Although you won't have to find out who is covered  by your insurance plan, it is a good idea to ask around and get recommendations. You will then need to call the office and see if the doctor you have chosen will accept you as a new patient and what types of options they offer for patients who are self-pay. Some doctors offer discounts or will set up payment plans for their patients who do not have insurance, but you will need to ask so you aren't surprised when you get to your appointment.  2) Contact Your Local Health Department Not all health departments have doctors that can see patients for sick visits, but many do, so it is worth a call to see if yours does. If you don't know where your local health department is, you can check in your phone book. The CDC also has a tool to help you locate your state's health department, and many state websites also have listings of all of their local health  departments.  3) Find a [facility] If your illness is not likely to be very severe or complicated, you may want to try a walk in clinic. These are popping up all over the country in pharmacies, drugstores, and shopping centers. They're usually staffed by nurse practitioners or physician assistants that have been trained to treat common illnesses and complaints. They're usually fairly quick and inexpensive. However, if you have serious medical issues or chronic medical problems, these are probably not your best option.  No Primary Care Doctor: - Call Health Connect at  [phone] - they can help you locate a primary care doctor that  accepts your insurance, provides certain services, etc. - Physician Referral Service- [phone]  Chronic Pain Problems: Organization         Address  Phone   Notes  [facility]  [phone] Patients need to be referred by their primary care doctor.   Medication Assistance: Organization         Address  Phone   Notes  [facility] Medication [facility] [place] [place], [place] [address] [phone] --Must be a resident of [facility] -- Must have NO insurance coverage whatsoever (no Medicaid/ Medicare, etc.) -- The pt. MUST have a primary care doctor that directs their care regularly and follows them in the community   MedAssist  [phone]   [employer]  [phone]    Agencies that provide inexpensive medical care: Organization         Address  Phone   Notes  [place]  [phone]   [facility]    [phone]   [facility] [place], [place] [address] [phone]   [place] [address] [place] [phone]   Planned Parenthood    [phone]   [facility]    [phone]   [place] and [place] [address], [place] Phone:  [phone], Fax:  [phone] Hours of Operation:  9 am - 6 pm, M-F.  Also accepts Medicaid/Medicare and self-pay.  [facility] for [place], [address], [place] Phone: [phone], Fax: [phone]. Hours of Operation:  8:30 am - 5:30 pm, M-F.  Also accepts Medicaid and self-pay.  [facility] [place] [address], [employer] Phone: [phone]   [place], [place], [place] [phone], Ext. [phone]  Mondays & Thursdays: 7-9 AM.  First 15 patients are seen on a first come, first serve basis.    [place] Providers:  Organization         Address  Phone   Notes  [facility] [address], [place] [phone] Also accepts self-pay patients.  [facility] [id number] [place], [place]  [phone]   [place], [address], [place] [phone]   [facility] [address], [place] [phone]   Lucianne Lei [address], [place]   [phone] Only accepts [place] Access [place] patients after they have their name applied to their card.   Self-Pay (no insurance) in [facility]:  Organization         Address  Phone   Notes  Sickle Cell Patients, [facility] [place] [phone]   [facility] [place] [phone]   [facility] [place]  [place], [place], [place] [phone]   [facility]/Dr. Osei-Bonsu  [address] [place] or [address], [place] [phone] Phone number for both [place] and [place] locations is the same.  [facility] [address] [place] [phone]   [facility] [address], [place] or [address] [phone]   [facility] [address] [place] [phone], phone; [phone], fax Sees patients 1st and 3rd Saturday of every month.  Must not qualify for public or private insurance (i.e. Medicaid, Medicare, [place] Health Choice, Veterans' Benefits)  Household income should be no more than 200% of the poverty level The clinic cannot treat you if you are pregnant or think you are pregnant  Sexually transmitted diseases are not treated at the clinic.    Dental Care: Organization         Address  Phone  Notes  [facility] Department of [facility] [phone] Accepts children up to age 44 who are enrolled in [place] or [place]; pregnant women with a Medicaid card; and children who have applied for Medicaid or [place] Health Choice, but were declined, whose parents can pay a reduced fee at time of service.  [facility] Department of [facility]  [address], [place] [phone] Accepts children up to age 39 who are enrolled in [place] or [place]; pregnant women with a Medicaid card; and children who have applied for Medicaid or [place] Health Choice, but were declined, whose parents can pay a reduced fee at time of service.  [place] Adult Dental Access PROGRAM  [place] [phone] Patients are seen by appointment only. Walk-ins are not accepted. [place] will see patients 62 years of age and older. Monday - Tuesday (8am-5pm) Most Wednesdays (8:30-5pm) $30 per visit, cash only  [facility] Adult Dental Access PROGRAM  [address], [facility] [phone] Patients are seen by appointment only. Walk-ins are not accepted. [place] will see patients 104 years of age and older. One Wednesday Evening (Monthly: Volunteer Based).  $30 per visit, cash only  [place]  [phone] for adults;  Children under age 54, call Graduate Pediatric Dentistry at [phone]. Children aged 12-14, please call [phone] to request a pediatric application.  Dental services are provided in all areas of dental care including fillings, crowns and bridges, complete and partial dentures, implants, gum treatment, root canals, and extractions. Preventive care is also provided. Treatment is provided to both adults and children. Patients are selected via a lottery and there is often a waiting list.   [facility] [address], [place]  [phone] [email or web address]   [facility] [address] [place], [place] [phone], Ext. [phone] Second and Fourth Thursday of each month, opens at 6:30 AM; Clinic ends at 9 AM.  Patients are seen on a first-come first-served basis, and a limited number are seen during each clinic.   [facility]  [address] Hillard Danker [place], [place] [phone]   Eligibility Requirements You must have lived in [place], [place], or [place] counties for at least the last three months.   You cannot be eligible for state or federal sponsored [employer], including [employer], [place], or [employer].   You generally cannot be eligible for healthcare insurance through your employer.    How to apply: Eligibility screenings are held every Tuesday and Wednesday afternoon from 1:00 pm until 4:00 pm. You do not need an appointment for the interview!  [facility] [address], [place], [place]   [place]  [place] Department  [place]  [phone]    Behavioral Health Resources in the Community: Intensive Outpatient Programs Organization         Address  Phone  Notes  [place]. [address], [place], [place] [phone]   [facility] Outpatient [address], [place], [place]   ADS: Alcohol [facility] Drug S[facility] [address] [place], [place]   [place] [address]  [place], [place] or [phone]   Substance Abuse Resources Organization         Address  Phone  Notes  Alcohol and Drug Services  [phone]   [place]  [phone]   [facility] [place]   Chinita Pester  [phone]   Residential & Outpatient Substance Abuse Program  [phone]   Psychological Services Organization         Address  Phone  Notes  [facility]  [place]  [phone]   [place] [address], [place] or [phone]    Mobile Crisis Teams Organization         Address  Phone  Notes  [facility], Mobile Crisis Care Unit  [phone]   [facility]  [address] [place], [place]   [place] [address] [place] [phone]    Self-Help/Support Groups Organization         Address  Phone             Notes  [place]. of Secretary - variety of support groups  [place] Call for more information  Narcotics Anonymous (NA), [facility] [address], [employer] [place]  2 meetings at this location   Brewing technologist  Notes  [facility]idential [facility]  [address]    [place]  [phone]   [facility]  [address] [place] [phone], [place], [place]   [place], [place] [phone] Admissions: 8am-3pm M-F  Incentives Substance [place] [address],    [place], [place] [phone]   [facility] [address] [place], [place], [place]   [facility] [address]  [place], [place]   [facility]ight Programs - Intensive  Outpatient [address] Kristeen Mans [address], [place], [place]   [facility] ([place].,  [place], [place] [phone] or [phone]   [facility] Services (RTS) [address] [place], [place] Accepts Medicaid  [facility] [place] [address],  [place] [phone] Substance Abuse/Addiction Treatment   [facility] Organization         Address  Phone  Notes  [facility]  [phone]   Domenic Schwab, PhD [address] Arlis [place], [place]   [phone] or [phone]   [place], [place] [phone]   [facility] [address], [place], [place] [phone] Insurance/Medicaid/sponsorship through [facility] and Families [address]                                    [place], [place] [phone] [place] [address] [place], [place] [phone]    Dr. Adele Schilder  [phone]   [facility] Dept. 1) [address], [place] 2) [place] 3)  [place] [address], [place] [phone]  [phone]   [place] [phone] or [phone] (After Hours)

## 2015-11-29 NOTE — ED Notes (Signed)
Pt refused w/c.

## 2015-11-29 NOTE — ED Provider Notes (Signed)
  Face-to-face evaluation   History: Recurrent right knee pain, without trauma. Recently diagnosed with gout, by the aspiration evaluation. Taking Indocin without relief. Has been on allopurinol and culture seen, in the past but is not currently taking them.  Physical exam: Alert, calm, cooperative. Right knee, very tender, with large effusion. He is able to move the right knee with nearly normal range of motion  Medical screening examination/treatment/procedure(s) were conducted as a shared visit with non-physician practitioner(s) and myself.  I personally evaluated the patient during the encounter  Daleen Bo, MD 11/30/15 1409

## 2015-11-29 NOTE — ED Notes (Signed)
Pt reports R knee pain since yesterday. Hx gout, feels like same. Pt tried home medication for gout. Medication helped initially, but then pain returned. Denies any injury

## 2015-11-29 NOTE — ED Provider Notes (Signed)
CSN: [id number]     Arrival date & time 11/29/15  1646 History  By signing my name below, I, Rayna Sexton, attest that this documentation has been prepared under the direction and in the presence of Robinson Brinkley C. Randell Teare, PA-C. Electronically Signed: Rayna Sexton, ED Scribe. 11/29/2015. 5:12 PM.      Chief Complaint  Patient presents with  . Knee Pain  . Gout    The history is provided by the patient. No language interpreter was used.     HPI Comments: Patrick Stewart is a 59 y.o. male with a hx of gout who presents to the Emergency Department complaining of constant, moderate, right knee pain with onset 3 days ago. Pt notes this pain is similar to prior gout flare ups further noting he took indomethacin with his last dose earlier today which provided short term relief with his pain then returning. He notes some associated mild joint swelling. Pt confirms being ambulatory but notes worsening pain when ambulating. He denies numbness, tingling, and weakness.   PCP: None  Past Medical History  Diagnosis Date  . Gout   . Hypertension   . Diabetes mellitus without complication ([place])   . MI (myocardial infarction) ([place])   . Chronic bronchitis [facility])    Past Surgical History  Procedure Laterality Date  . Coronary artery bypass graft    . Knee surgery    . Rotator cuff repair    . Wrist surgery     History reviewed. No pertinent family history. Social History  Substance Use Topics  . Smoking status: Current Some Day Smoker -- 0.25 packs/day  . Smokeless tobacco: None  . Alcohol Use: No      Review of Systems  Musculoskeletal: Positive for joint swelling and arthralgias.  Skin: Negative for wound.  Neurological: Negative for weakness and numbness.    Allergies  Hydrocodone  Home Medications   Prior to Admission medications   Medication Sig Start Date End Date Taking? Authorizing Provider  albuterol (PROVENTIL HFA;VENTOLIN HFA) 108 (90 BASE) MCG/ACT inhaler Inhale 1-2  puffs into the lungs every 6 (six) hours as needed for wheezing or shortness of breath. 07/13/15   [name], MD  albuterol (PROVENTIL) (2.5 MG/3ML) 0.083% nebulizer solution Take 2.5 mg by nebulization every 6 (six) hours as needed for wheezing or shortness of breath.    Historical Provider, MD  aspirin 325 MG tablet Take 325 mg by mouth daily.    Historical Provider, MD  atorvastatin (LIPITOR) 40 MG tablet Take 40 mg by mouth daily.    Historical Provider, MD  benzonatate (TESSALON) 100 MG capsule Take 1 capsule (100 mg total) by mouth every 8 (eight) hours. Patient not taking: Reported on 09/14/2015 07/17/15   [name], MD  carvedilol (COREG) 6.25 MG tablet Take 1 tablet (6.25 mg total) by mouth 2 (two) times daily with a meal. 10/14/15   Charlynne Cousins, MD  dextromethorphan-guaiFENesin [facility] DM) 30-600 MG per 12 hr tablet Take 1 tablet by mouth 2 (two) times daily. Patient not taking: Reported on 09/14/2015 07/13/15   [name], MD  indomethacin (INDOCIN) 25 MG capsule Take 1 capsule (25 mg total) by mouth 3 (three) times daily as needed. 11/29/15   Marella Chimes, PA-C  isosorbide mononitrate (IMDUR) 30 MG 24 hr tablet Take 1 tablet (30 mg total) by mouth daily. 10/14/15   Charlynne Cousins, MD  lisinopril (PRINIVIL,ZESTRIL) 40 MG tablet Take 40 mg by mouth daily. 10/04/15   Historical Provider, MD  metFORMIN (GLUCOPHAGE) 1000 MG tablet Take 1,000 mg by mouth 2 (two) times daily as needed (high BS).     Historical Provider, MD  NIFEdipine (PROCARDIA-XL/ADALAT CC) 60 MG 24 hr tablet Take 60 mg by mouth daily. 09/30/15   Historical Provider, MD  oxyCODONE-acetaminophen (PERCOCET/ROXICET) 5-325 MG tablet Take 1-2 tablets by mouth every 4 (four) hours as needed for severe pain. 11/29/15   Marella Chimes, PA-C  predniSONE (DELTASONE) 20 MG tablet Take 2 tablets (40 mg total) by mouth daily. 11/29/15   Marella Chimes, PA-C  tamsulosin (FLOMAX) 0.4 MG CAPS capsule  Take 1 capsule (0.4 mg total) by mouth daily after supper. 10/14/15   Charlynne Cousins, MD    BP 164/91 mmHg  Pulse 92  Temp(Src) 97.5 F (36.4 C) (Oral)  Resp 20  SpO2 99%  Physical Exam  Constitutional: He is oriented to person, place, and time. He appears well-developed and well-nourished. No distress.  HENT:  Head: Normocephalic and atraumatic.  Right Ear: External ear normal.  Left Ear: External ear normal.  Nose: Nose normal.  Eyes: Conjunctivae and EOM are normal. Right eye exhibits no discharge. Left eye exhibits no discharge. No scleral icterus.  Neck: Normal range of motion. Neck supple.  Cardiovascular: Normal rate, regular rhythm and intact distal pulses.   Pulmonary/Chest: Effort normal and breath sounds normal. No respiratory distress.  Musculoskeletal: He exhibits edema and tenderness.  TTP over medial aspect of right knee with mild edema and decreased range of motion due to pain. No significant erythema or heat. Strength and sensation intact. Distal pulses intact.  Neurological: He is alert and oriented to person, place, and time. He has normal strength. No sensory deficit.  Skin: Skin is warm and dry. He is not diaphoretic.  Psychiatric: He has a normal mood and affect. His behavior is normal.  Nursing note and vitals reviewed.   ED Course  Procedures   DIAGNOSTIC STUDIES: Oxygen Saturation is 99% on RA, normal by my interpretation.    COORDINATION OF CARE: 5:11 PM Pt presents today due to right knee pain consistent with prior gout flareups. Discussed treatment plan with pt at bedside. Return precautions noted. Pt agreed to plan.  Labs Review Labs Reviewed - No data to display  Imaging Review No results found.    EKG Interpretation None      MDM   Final diagnoses:  Acute gout of right knee, unspecified cause    59 year old male presents with right knee pain, which he states is consistent with his history of gout. He reports his symptoms  started 3 days ago and have been constant since that time. He reports associated mild edema. He denies fever, chills. Patient is afebrile. Vital signs stable. Tenderness to palpation of medial aspect of right knee with associated mild edema and decreased range of motion due to pain. No significant erythema or heat. Strength and sensation intact. Distal pulses intact. Patient given pain medication and steroid in the ED. Patient discussed with and seen by Dr. Eulis Foster, who advised no imaging is indicated at this time and to treat patient with short course of pain medication and steroid. Will also refill indomethacin. Symptoms likely due to gout, low suspicion for septic joint. Return precautions discussed. Patient to follow up with PCP, resource list given. Patient verbalizes his understanding and is in agreement with plan.  BP 164/91 mmHg  Pulse 92  Temp(Src) 97.5 F (36.4 C) (Oral)  Resp 20  SpO2 99%  I personally performed  the services described in this documentation, which was scribed in my presence. The recorded information has been reviewed and is accurate.   Marella Chimes, PA-C 11/29/15 1725  Daleen Bo, MD 11/30/15 1409

## 2016-01-29 ENCOUNTER — Emergency Department (HOSPITAL_BASED_OUTPATIENT_CLINIC_OR_DEPARTMENT_OTHER)
Admission: EM | Admit: 2016-01-29 | Discharge: 2016-01-29 | Disposition: A | Discharge status: Home / Self Care | Payer: Medicare Other | Attending: Emergency Medicine | Admitting: Emergency Medicine

## 2016-01-29 ENCOUNTER — Encounter (HOSPITAL_BASED_OUTPATIENT_CLINIC_OR_DEPARTMENT_OTHER): Payer: Self-pay

## 2016-01-29 DIAGNOSIS — M109 Gout, unspecified: Secondary | ICD-10-CM

## 2016-01-29 DIAGNOSIS — S161XXA Strain of muscle, fascia and tendon at neck level, initial encounter: Secondary | ICD-10-CM

## 2016-01-29 DIAGNOSIS — Z8709 Personal history of other diseases of the respiratory system: Secondary | ICD-10-CM | POA: Insufficient documentation

## 2016-01-29 DIAGNOSIS — Y998 Other external cause status: Secondary | ICD-10-CM | POA: Insufficient documentation

## 2016-01-29 DIAGNOSIS — S199XXA Unspecified injury of neck, initial encounter: Secondary | ICD-10-CM | POA: Diagnosis present

## 2016-01-29 DIAGNOSIS — Z7952 Long term (current) use of systemic steroids: Secondary | ICD-10-CM | POA: Insufficient documentation

## 2016-01-29 DIAGNOSIS — S0990XA Unspecified injury of head, initial encounter: Secondary | ICD-10-CM | POA: Diagnosis not present

## 2016-01-29 DIAGNOSIS — I10 Essential (primary) hypertension: Secondary | ICD-10-CM | POA: Diagnosis not present

## 2016-01-29 DIAGNOSIS — I252 Old myocardial infarction: Secondary | ICD-10-CM | POA: Diagnosis not present

## 2016-01-29 DIAGNOSIS — Z7982 Long term (current) use of aspirin: Secondary | ICD-10-CM | POA: Diagnosis not present

## 2016-01-29 DIAGNOSIS — Y9389 Activity, other specified: Secondary | ICD-10-CM | POA: Insufficient documentation

## 2016-01-29 DIAGNOSIS — Z951 Presence of aortocoronary bypass graft: Secondary | ICD-10-CM | POA: Insufficient documentation

## 2016-01-29 DIAGNOSIS — M10061 Idiopathic gout, right knee: Secondary | ICD-10-CM | POA: Insufficient documentation

## 2016-01-29 DIAGNOSIS — R030 Elevated blood-pressure reading, without diagnosis of hypertension: Secondary | ICD-10-CM

## 2016-01-29 DIAGNOSIS — F17200 Nicotine dependence, unspecified, uncomplicated: Secondary | ICD-10-CM | POA: Insufficient documentation

## 2016-01-29 DIAGNOSIS — Z79899 Other long term (current) drug therapy: Secondary | ICD-10-CM | POA: Insufficient documentation

## 2016-01-29 DIAGNOSIS — E119 Type 2 diabetes mellitus without complications: Secondary | ICD-10-CM | POA: Diagnosis not present

## 2016-01-29 DIAGNOSIS — Y9289 Other specified places as the place of occurrence of the external cause: Secondary | ICD-10-CM | POA: Diagnosis not present

## 2016-01-29 DIAGNOSIS — Z9889 Other specified postprocedural states: Secondary | ICD-10-CM | POA: Diagnosis not present

## 2016-01-29 DIAGNOSIS — IMO0001 Reserved for inherently not codable concepts without codable children: Secondary | ICD-10-CM

## 2016-01-29 MED ORDER — NIFEDIPINE ER OSMOTIC RELEASE 30 MG PO TB24
60.0000 mg | ORAL_TABLET | Freq: Once | ORAL | Status: AC
  Administered 2016-01-29: 60 mg via ORAL
  Filled 2016-01-29: qty 2

## 2016-01-29 MED ORDER — LISINOPRIL 10 MG PO TABS
40.0000 mg | ORAL_TABLET | Freq: Once | ORAL | Status: AC
  Administered 2016-01-29: 40 mg via ORAL
  Filled 2016-01-29: qty 4

## 2016-01-29 MED ORDER — METHOCARBAMOL 500 MG PO TABS
1000.0000 mg | ORAL_TABLET | Freq: Once | ORAL | Status: AC
  Administered 2016-01-29: 1000 mg via ORAL
  Filled 2016-01-29: qty 2

## 2016-01-29 MED ORDER — CARVEDILOL 6.25 MG PO TABS
6.2500 mg | ORAL_TABLET | Freq: Two times a day (BID) | ORAL | Status: DC
  Filled 2016-01-29: qty 1

## 2016-01-29 MED ORDER — OXYCODONE-ACETAMINOPHEN 5-325 MG PO TABS: 1.0000 | ORAL_TABLET | ORAL | Status: DC | PRN

## 2016-01-29 MED ORDER — METHOCARBAMOL 500 MG PO TABS: 1000.0000 mg | ORAL_TABLET | Freq: Three times a day (TID) | ORAL | Status: DC | PRN

## 2016-01-29 MED ORDER — KETOROLAC TROMETHAMINE 60 MG/2ML IM SOLN
60.0000 mg | Freq: Once | INTRAMUSCULAR | Status: AC
  Administered 2016-01-29: 60 mg via INTRAMUSCULAR
  Filled 2016-01-29: qty 2

## 2016-01-29 NOTE — Discharge Instructions (Signed)
Cervical Sprain A cervical sprain is when the tissues (ligaments) that hold the neck bones in place stretch or tear. HOME CARE   Put ice on the injured area.  Put ice in a plastic bag.  Place a towel between your skin and the bag.  Leave the ice on for 15-20 minutes, 3-4 times a day.  You may have been given a collar to wear. This collar keeps your neck from moving while you heal.  Do not take the collar off unless told by your doctor.  If you have long hair, keep it outside of the collar.  Ask your doctor before changing the position of your collar. You may need to change its position over time to make it more comfortable.  If you are allowed to take off the collar for cleaning or bathing, follow your doctor's instructions on how to do it safely.  Keep your collar clean by wiping it with mild soap and water. Dry it completely. If the collar has removable pads, remove them every 1-2 days to hand wash them with soap and water. Allow them to air dry. They should be dry before you wear them in the collar.  Do not drive while wearing the collar.  Only take medicine as told by your doctor.  Keep all doctor visits as told.  Keep all physical therapy visits as told.  Adjust your work station so that you have good posture while you work.  Avoid positions and activities that make your problems worse.  Warm up and stretch before being active. GET HELP IF:  Your pain is not controlled with medicine.  You cannot take less pain medicine over time as planned.  Your activity level does not improve as expected. GET HELP RIGHT AWAY IF:   You are bleeding.  Your stomach is upset.  You have an allergic reaction to your medicine.  You develop new problems that you cannot explain.  You lose feeling (become numb) or you cannot move any part of your body (paralysis).  You have tingling or weakness in any part of your body.  Your symptoms get worse. Symptoms include:  Pain,  soreness, stiffness, puffiness (swelling), or a burning feeling in your neck.  Pain when your neck is touched.  Shoulder or upper back pain.  Limited ability to move your neck.  Headache.  Dizziness.  Your hands or arms feel week, lose feeling, or tingle.  Muscle spasms.  Difficulty swallowing or chewing. MAKE SURE YOU:   Understand these instructions.  Will watch your condition.  Will get help right away if you are not doing well or get worse.   This information is not intended to replace advice given to you by your health care provider. Make sure you discuss any questions you have with your health care provider.   Document Released: 06/01/2008 Document Revised: 08/16/2013 Document Reviewed: 06/21/2013 Elsevier Interactive Patient Education 2016 [employer].  Gout Gout is an inflammatory arthritis caused by a buildup of uric acid crystals in the joints. Uric acid is a chemical that is normally present in the blood. When the level of uric acid in the blood is too high it can form crystals that deposit in your joints and tissues. This causes joint redness, soreness, and swelling (inflammation). Repeat attacks are common. Over time, uric acid crystals can form into masses (tophi) near a joint, destroying bone and causing disfigurement. Gout is treatable and often preventable. CAUSES  The disease begins with elevated levels of uric acid  in the blood. Uric acid is produced by your body when it breaks down a naturally found substance called purines. Certain foods you eat, such as meats and fish, contain high amounts of purines. Causes of an elevated uric acid level include:  Being passed down from parent to child (heredity).  Diseases that cause increased uric acid production (such as obesity, psoriasis, and certain cancers).  Excessive alcohol use.  Diet, especially diets rich in meat and seafood.  Medicines, including certain cancer-fighting medicines (chemotherapy), water  pills (diuretics), and aspirin.  Chronic kidney disease. The kidneys are no longer able to remove uric acid well.  Problems with metabolism. Conditions strongly associated with gout include:  Obesity.  High blood pressure.  High cholesterol.  Diabetes. Not everyone with elevated uric acid levels gets gout. It is not understood why some people get gout and others do not. Surgery, joint injury, and eating too much of certain foods are some of the factors that can lead to gout attacks. SYMPTOMS   An attack of gout comes on quickly. It causes intense pain with redness, swelling, and warmth in a joint.  Fever can occur.  Often, only one joint is involved. Certain joints are more commonly involved:  Base of the big toe.  Knee.  Ankle.  Wrist.  Finger. Without treatment, an attack usually goes away in a few days to weeks. Between attacks, you usually will not have symptoms, which is different from many other forms of arthritis. DIAGNOSIS  Your caregiver will suspect gout based on your symptoms and exam. In some cases, tests may be recommended. The tests may include:  Blood tests.  Urine tests.  X-rays.  Joint fluid exam. This exam requires a needle to remove fluid from the joint (arthrocentesis). Using a microscope, gout is confirmed when uric acid crystals are seen in the joint fluid. TREATMENT  There are two phases to gout treatment: treating the sudden onset (acute) attack and preventing attacks (prophylaxis).  Treatment of an Acute Attack.  Medicines are used. These include anti-inflammatory medicines or steroid medicines.  An injection of steroid medicine into the affected joint is sometimes necessary.  The painful joint is rested. Movement can worsen the arthritis.  You may use warm or cold treatments on painful joints, depending which works best for you.  Treatment to Prevent Attacks.  If you suffer from frequent gout attacks, your caregiver may advise  preventive medicine. These medicines are started after the acute attack subsides. These medicines either help your kidneys eliminate uric acid from your body or decrease your uric acid production. You may need to stay on these medicines for a very long time.  The early phase of treatment with preventive medicine can be associated with an increase in acute gout attacks. For this reason, during the first few months of treatment, your caregiver may also advise you to take medicines usually used for acute gout treatment. Be sure you understand your caregiver's directions. Your caregiver may make several adjustments to your medicine dose before these medicines are effective.  Discuss dietary treatment with your caregiver or dietitian. Alcohol and drinks high in sugar and fructose and foods such as meat, poultry, and seafood can increase uric acid levels. Your caregiver or dietitian can advise you on drinks and foods that should be limited. HOME CARE INSTRUCTIONS   Do not take aspirin to relieve pain. This raises uric acid levels.  Only take over-the-counter or prescription medicines for pain, discomfort, or fever as directed by your caregiver.  Rest the joint as much as possible. When in bed, keep sheets and blankets off painful areas.  Keep the affected joint raised (elevated).  Apply warm or cold treatments to painful joints. Use of warm or cold treatments depends on which works best for you.  Use crutches if the painful joint is in your leg.  Drink enough fluids to keep your urine clear or pale yellow. This helps your body get rid of uric acid. Limit alcohol, sugary drinks, and fructose drinks.  Follow your dietary instructions. Pay careful attention to the amount of protein you eat. Your daily diet should emphasize fruits, vegetables, whole grains, and fat-free or low-fat milk products. Discuss the use of coffee, vitamin C, and cherries with your caregiver or dietitian. These may be helpful in  lowering uric acid levels.  Maintain a healthy body weight. SEEK MEDICAL CARE IF:   You develop diarrhea, vomiting, or any side effects from medicines.  You do not feel better in 24 hours, or you are getting worse. SEEK IMMEDIATE MEDICAL CARE IF:   Your joint becomes suddenly more tender, and you have chills or a fever. MAKE SURE YOU:   Understand these instructions.  Will watch your condition.  Will get help right away if you are not doing well or get worse.   This information is not intended to replace advice given to you by your health care provider. Make sure you discuss any questions you have with your health care provider.   Document Released: 12/11/2000 Document Revised: 01/04/2015 Document Reviewed: 07/27/2012 Elsevier Interactive Patient Education [employer].

## 2016-01-29 NOTE — ED Notes (Signed)
Hit right side of head on car while getting in car on Monday-denies LOC-c/o HA and neck pain

## 2016-01-29 NOTE — ED Notes (Signed)
Dr Lita Mains Aware that coreg is not available.

## 2016-01-29 NOTE — ED Provider Notes (Signed)
CSN: [id number]     Arrival date & time 01/29/16  1216 History   First MD Initiated Contact with Patient 01/29/16 1230     Chief Complaint  Patient presents with  . Head Injury     (Consider location/radiation/quality/duration/timing/severity/associated sxs/prior Treatment) HPI Patient presents with 2 days of constant right-sided neck pain after striking his head while getting into his car on Monday. Complains of very mild right-sided scalp pain but the neck pain has worsened over the last 2 days. Denies any focal numbness or weakness. No loss of consciousness. Patient also has history of gout especially his right knee and states that his right knee has been swelling with increasing pain of the last day. He's had no fever or chills. No trauma to the right knee. Past Medical History  Diagnosis Date  . Gout   . Hypertension   . Diabetes mellitus without complication ([place])   . MI (myocardial infarction) ([place])   . Chronic bronchitis [facility])    Past Surgical History  Procedure Laterality Date  . Coronary artery bypass graft    . Knee surgery    . Rotator cuff repair    . Wrist surgery     No family history on file. Social History  Substance Use Topics  . Smoking status: Current Some Day Smoker -- 0.25 packs/day  . Smokeless tobacco: None  . Alcohol Use: No    Review of Systems  Constitutional: Negative for fever and chills.  HENT: Negative for facial swelling.   Eyes: Negative for visual disturbance.  Respiratory: Negative for shortness of breath.   Cardiovascular: Negative for chest pain.  Gastrointestinal: Negative for nausea, vomiting, abdominal pain, diarrhea and constipation.  Musculoskeletal: Positive for joint swelling, arthralgias and neck pain.  Skin: Negative for rash and wound.  Neurological: Positive for headaches. Negative for dizziness, syncope, weakness, light-headedness and numbness.  All other systems reviewed and are negative.     Allergies   Hydrocodone  Home Medications   Prior to Admission medications   Medication Sig Start Date End Date Taking? Authorizing Provider  albuterol (PROVENTIL HFA;VENTOLIN HFA) 108 (90 BASE) MCG/ACT inhaler Inhale 1-2 puffs into the lungs every 6 (six) hours as needed for wheezing or shortness of breath. 07/13/15   [name], MD  albuterol (PROVENTIL) (2.5 MG/3ML) 0.083% nebulizer solution Take 2.5 mg by nebulization every 6 (six) hours as needed for wheezing or shortness of breath.    Historical Provider, MD  aspirin 325 MG tablet Take 325 mg by mouth daily.    Historical Provider, MD  atorvastatin (LIPITOR) 40 MG tablet Take 40 mg by mouth daily.    Historical Provider, MD  benzonatate (TESSALON) 100 MG capsule Take 1 capsule (100 mg total) by mouth every 8 (eight) hours. Patient not taking: Reported on 09/14/2015 07/17/15   [name], MD  carvedilol (COREG) 6.25 MG tablet Take 1 tablet (6.25 mg total) by mouth 2 (two) times daily with a meal. 10/14/15   Charlynne Cousins, MD  dextromethorphan-guaiFENesin [facility] DM) 30-600 MG per 12 hr tablet Take 1 tablet by mouth 2 (two) times daily. Patient not taking: Reported on 09/14/2015 07/13/15   [name], MD  indomethacin (INDOCIN) 25 MG capsule Take 1 capsule (25 mg total) by mouth 3 (three) times daily as needed. 11/29/15   Marella Chimes, PA-C  isosorbide mononitrate (IMDUR) 30 MG 24 hr tablet Take 1 tablet (30 mg total) by mouth daily. 10/14/15   Charlynne Cousins, MD  lisinopril ([place])  40 MG tablet Take 40 mg by mouth daily. 10/04/15   Historical Provider, MD  metFORMIN (GLUCOPHAGE) 1000 MG tablet Take 1,000 mg by mouth 2 (two) times daily as needed (high BS).     Historical Provider, MD  methocarbamol (ROBAXIN) 500 MG tablet Take 2 tablets (1,000 mg total) by mouth every 8 (eight) hours as needed for muscle spasms. 01/29/16   Julianne Rice, MD  NIFEdipine (PROCARDIA-XL/ADALAT CC) 60 MG 24 hr tablet Take 60 mg by mouth  daily. 09/30/15   Historical Provider, MD  oxyCODONE-acetaminophen (PERCOCET/ROXICET) 5-325 MG tablet Take 1-2 tablets by mouth every 4 (four) hours as needed for severe pain. 01/29/16   Julianne Rice, MD  predniSONE (DELTASONE) 20 MG tablet Take 2 tablets (40 mg total) by mouth daily. 11/29/15   Marella Chimes, PA-C  tamsulosin (FLOMAX) 0.4 MG CAPS capsule Take 1 capsule (0.4 mg total) by mouth daily after supper. 10/14/15   Charlynne Cousins, MD   BP 190/107 mmHg  Pulse 75  Temp(Src) 98.9 F (37.2 C) (Oral)  Resp 18  Ht 5\' 10"  (1.778 m)  Wt 196 lb (88.905 kg)  BMI 28.12 kg/m2  SpO2 100% Physical Exam  Constitutional: He is oriented to person, place, and time. He appears well-developed and well-nourished. No distress.  HENT:  Head: Normocephalic and atraumatic.  Mouth/Throat: Oropharynx is clear and moist.  Eyes: EOM are normal. Pupils are equal, round, and reactive to light.  Neck: Normal range of motion. Neck supple. No JVD present.  Patient has tenderness to palpation over the right paracervical muscles. Spasm noted. No midline cervical tenderness to palpation.  Cardiovascular: Normal rate and regular rhythm.  Exam reveals no gallop and no friction rub.   No murmur heard. Pulmonary/Chest: Effort normal and breath sounds normal. No respiratory distress. He has no wheezes. He has no rales. He exhibits no tenderness.  Abdominal: Soft. Bowel sounds are normal. He exhibits no distension and no mass. There is no tenderness. There is no rebound and no guarding.  Musculoskeletal: Normal range of motion. He exhibits tenderness. He exhibits no edema.  Mild erythema to the right knee. Pain with range of motion. Small amount of joint effusion. Distal pulses equal and intact. No calf swelling or tenderness.  Neurological: He is alert and oriented to person, place, and time.  5/5 motor in all extremities. Mobility of the right lower extremity inhibited by pain.  Skin: Skin is warm and dry.  No rash noted. No erythema.  Psychiatric: He has a normal mood and affect. His behavior is normal.  Nursing note and vitals reviewed.   ED Course  Procedures (including critical care time) Labs Review Labs Reviewed - No data to display  Imaging Review No results found. I have personally reviewed and evaluated these images and lab results as part of my medical decision-making.   EKG Interpretation None      MDM   Final diagnoses:  Cervical strain, initial encounter  Acute gout of right knee, unspecified cause  Elevated blood pressure    Shouldn't with neck strain status post minor head trauma. Normal neurologic exam. Do not believe that imaging is necessary at this point. Patient also has right knee exam consistent with flare of his normal gout. Advise continue taking his gout medication. Patient states he has not had his blood pressure medication in the last 2 days. Will dose that here as well as give symptomatic treatment. He is advised to follow-up with his primary physician.   Shanon Brow  Lita Mains, MD 01/29/16 1249

## 2016-01-30 ENCOUNTER — Encounter (HOSPITAL_COMMUNITY): Payer: Self-pay | Admitting: Emergency Medicine

## 2016-01-30 ENCOUNTER — Emergency Department (HOSPITAL_COMMUNITY)
Admission: EM | Admit: 2016-01-30 | Discharge: 2016-01-30 | Disposition: A | Discharge status: Home / Self Care | Payer: Medicare Other | Attending: Emergency Medicine | Admitting: Emergency Medicine

## 2016-01-30 DIAGNOSIS — F17200 Nicotine dependence, unspecified, uncomplicated: Secondary | ICD-10-CM | POA: Diagnosis not present

## 2016-01-30 DIAGNOSIS — M10061 Idiopathic gout, right knee: Secondary | ICD-10-CM | POA: Insufficient documentation

## 2016-01-30 DIAGNOSIS — I10 Essential (primary) hypertension: Secondary | ICD-10-CM | POA: Diagnosis not present

## 2016-01-30 DIAGNOSIS — M25561 Pain in right knee: Secondary | ICD-10-CM | POA: Diagnosis not present

## 2016-01-30 DIAGNOSIS — Z8709 Personal history of other diseases of the respiratory system: Secondary | ICD-10-CM | POA: Diagnosis not present

## 2016-01-30 DIAGNOSIS — M109 Gout, unspecified: Secondary | ICD-10-CM | POA: Diagnosis not present

## 2016-01-30 DIAGNOSIS — I252 Old myocardial infarction: Secondary | ICD-10-CM | POA: Diagnosis not present

## 2016-01-30 DIAGNOSIS — Z951 Presence of aortocoronary bypass graft: Secondary | ICD-10-CM | POA: Insufficient documentation

## 2016-01-30 DIAGNOSIS — E119 Type 2 diabetes mellitus without complications: Secondary | ICD-10-CM | POA: Diagnosis not present

## 2016-01-30 DIAGNOSIS — M25461 Effusion, right knee: Secondary | ICD-10-CM | POA: Diagnosis present

## 2016-01-30 MED ORDER — OXYCODONE-ACETAMINOPHEN 5-325 MG PO TABS: 1.0000 | ORAL_TABLET | ORAL | Status: DC | PRN

## 2016-01-30 MED ORDER — PREDNISONE 20 MG PO TABS
60.0000 mg | ORAL_TABLET | Freq: Once | ORAL | Status: AC
  Administered 2016-01-30: 60 mg via ORAL
  Filled 2016-01-30: qty 3

## 2016-01-30 MED ORDER — PREDNISONE 10 MG PO TABS: 20.0000 mg | ORAL_TABLET | Freq: Every day | ORAL | Status: DC

## 2016-01-30 NOTE — ED Provider Notes (Signed)
CSN: [id number]     Arrival date & time 01/30/16  1920 History  By signing my name below, I, Jolayne Panther, attest that this documentation has been prepared under the direction and in the presence of Junius Creamer, NP. Electronically Signed: Jolayne Panther, Scribe. 01/30/2016. 8:59 PM.   Chief Complaint  Patient presents with  . Joint Swelling    right knee swelling   The history is provided by the patient. No language interpreter was used.    HPI Comments: Patrick Stewart is a 60 y.o. male with a hx of gout who presents to the Emergency Department complaining of sudden onset, mild edema of his right knee due to a gout flare up which began 3 days ago. He also reports associated, mild, right knee pain. Pt notes that he reported to [facility] yesterday where he received an injection of toradol and a prescription for 6 oxycodone for treatment of his pain, but his pain has returned since. Pt reports that he has taken indomethacin and oxycodone for treatment of his past gout flare ups which have helped to relieve his pain in the past.  Past Medical History  Diagnosis Date  . Gout   . Hypertension   . Diabetes mellitus without complication ([place])   . MI (myocardial infarction) ([place])   . Chronic bronchitis [facility])    Past Surgical History  Procedure Laterality Date  . Coronary artery bypass graft    . Knee surgery    . Rotator cuff repair    . Wrist surgery     History reviewed. No pertinent family history. Social History  Substance Use Topics  . Smoking status: Current Some Day Smoker -- 0.25 packs/day  . Smokeless tobacco: None  . Alcohol Use: No    Review of Systems  Musculoskeletal: Positive for joint swelling and arthralgias.  Skin: Positive for color change.  All other systems reviewed and are negative.  Allergies  Hydrocodone  Home Medications   Prior to Admission medications   Medication Sig Start Date End Date Taking? Authorizing Provider  methocarbamol  (ROBAXIN) 500 MG tablet Take 2 tablets (1,000 mg total) by mouth every 8 (eight) hours as needed for muscle spasms. 01/29/16   Julianne Rice, MD  oxyCODONE-acetaminophen (PERCOCET/ROXICET) 5-325 MG tablet Take 1-2 tablets by mouth every 4 (four) hours as needed for severe pain. 01/30/16   Junius Creamer, NP  predniSONE (DELTASONE) 10 MG tablet Take 2 tablets (20 mg total) by mouth daily. 01/30/16   Junius Creamer, NP   BP 169/91 mmHg  Pulse 92  Temp(Src) 98.2 F (36.8 C) (Oral)  Resp 16  Ht 5\' 10"  (1.778 m)  Wt 87.998 kg  BMI 27.84 kg/m2  SpO2 99% Physical Exam  Constitutional: He is oriented to person, place, and time. He appears well-developed and well-nourished. No distress.  HENT:  Head: Normocephalic and atraumatic.  Eyes: Pupils are equal, round, and reactive to light.  Neck: Normal range of motion.  Cardiovascular: Normal rate and regular rhythm.   Pulmonary/Chest: Effort normal.  Musculoskeletal: Normal range of motion. He exhibits tenderness. He exhibits no edema.       Right knee: He exhibits swelling and erythema. Tenderness found.  Neurological: He is alert and oriented to person, place, and time.  Skin: Skin is warm and dry. There is erythema.  Psychiatric: He has a normal mood and affect.  Nursing note and vitals reviewed.   ED Course  Procedures  DIAGNOSTIC STUDIES:    Oxygen Saturation is  99% on RA, normal by my interpretation.   COORDINATION OF CARE:  8:03 PM Will prescribe pt prednisone. Discussed treatment plan with pt at bedside and pt agreed to plan. Give Rx for additional Oxycodone   MDM   Final diagnoses:  Acute gout of right knee, unspecified cause    I personally performed the services described in this documentation, which was scribed in my presence. The recorded information has been reviewed and is accurate.   Junius Creamer, NP 01/30/16 [id number]  Carmin Muskrat, MD 01/30/16 [phone]

## 2016-01-30 NOTE — Discharge Instructions (Signed)
Gout Gout is when your joints become red, sore, and swell (inflamed). This is caused by the buildup of uric acid crystals in the joints. Uric acid is a chemical that is normally in the blood. If the level of uric acid gets too high in the blood, these crystals form in your joints and tissues. Over time, these crystals can form into masses near the joints and tissues. These masses can destroy bone and cause the bone to look misshapen (deformed). HOME CARE   Do not take aspirin for pain.  Only take medicine as told by your doctor.  Rest the joint as much as you can. When in bed, keep sheets and blankets off painful areas.  Keep the sore joints raised (elevated).  Put warm or cold packs on painful joints. Use of warm or cold packs depends on which works best for you.  Use crutches if the painful joint is in your leg.  Drink enough fluids to keep your pee (urine) clear or pale yellow. Limit alcohol, sugary drinks, and drinks with fructose in them.  Follow your diet instructions. Pay careful attention to how much protein you eat. Include fruits, vegetables, whole grains, and fat-free or low-fat milk products in your daily diet. Talk to your doctor or dietitian about the use of coffee, vitamin C, and cherries. These may help lower uric acid levels.  Keep a healthy body weight. GET HELP RIGHT AWAY IF:   You have watery poop (diarrhea), throw up (vomit), or have any side effects from medicines.  You do not feel better in 24 hours, or you are getting worse.  Your joint becomes suddenly more tender, and you have chills or a fever. MAKE SURE YOU:   Understand these instructions.  Will watch your condition.  Will get help right away if you are not doing well or get worse.   This information is not intended to replace advice given to you by your health care provider. Make sure you discuss any questions you have with your health care provider.   Document Released: 09/22/2008 Document Revised:  01/04/2015 Document Reviewed: 07/27/2012 Elsevier Interactive Patient Education 2016 [employer]. Take the prednisone as directed this is an anti inflamatory medication so please do not take any additional Motrin/advil while you are taking the prednisone

## 2016-01-30 NOTE — ED Notes (Signed)
Patient complaining of right knee swelling. Patient states that he did not have injury to knee. Patient states that he went to [facility] yesterday and got a shot in the butt but the pain has came back.

## 2016-02-14 ENCOUNTER — Emergency Department (HOSPITAL_BASED_OUTPATIENT_CLINIC_OR_DEPARTMENT_OTHER)
Admission: EM | Admit: 2016-02-14 | Discharge: 2016-02-14 | Disposition: A | Discharge status: Home / Self Care | Payer: Medicare Other | Attending: Emergency Medicine | Admitting: Emergency Medicine

## 2016-02-14 ENCOUNTER — Encounter (HOSPITAL_BASED_OUTPATIENT_CLINIC_OR_DEPARTMENT_OTHER): Payer: Self-pay | Admitting: Adult Health

## 2016-02-14 DIAGNOSIS — Z951 Presence of aortocoronary bypass graft: Secondary | ICD-10-CM | POA: Insufficient documentation

## 2016-02-14 DIAGNOSIS — Z8709 Personal history of other diseases of the respiratory system: Secondary | ICD-10-CM | POA: Diagnosis not present

## 2016-02-14 DIAGNOSIS — I10 Essential (primary) hypertension: Secondary | ICD-10-CM | POA: Insufficient documentation

## 2016-02-14 DIAGNOSIS — L03116 Cellulitis of left lower limb: Secondary | ICD-10-CM | POA: Diagnosis not present

## 2016-02-14 DIAGNOSIS — I252 Old myocardial infarction: Secondary | ICD-10-CM | POA: Diagnosis not present

## 2016-02-14 DIAGNOSIS — Z9889 Other specified postprocedural states: Secondary | ICD-10-CM | POA: Diagnosis not present

## 2016-02-14 DIAGNOSIS — F17200 Nicotine dependence, unspecified, uncomplicated: Secondary | ICD-10-CM | POA: Diagnosis not present

## 2016-02-14 DIAGNOSIS — Z7952 Long term (current) use of systemic steroids: Secondary | ICD-10-CM | POA: Insufficient documentation

## 2016-02-14 DIAGNOSIS — B353 Tinea pedis: Secondary | ICD-10-CM | POA: Diagnosis not present

## 2016-02-14 DIAGNOSIS — M109 Gout, unspecified: Secondary | ICD-10-CM | POA: Insufficient documentation

## 2016-02-14 DIAGNOSIS — E119 Type 2 diabetes mellitus without complications: Secondary | ICD-10-CM | POA: Insufficient documentation

## 2016-02-14 DIAGNOSIS — M7989 Other specified soft tissue disorders: Secondary | ICD-10-CM | POA: Diagnosis present

## 2016-02-14 MED ORDER — DOXYCYCLINE HYCLATE 100 MG PO TABS
100.0000 mg | ORAL_TABLET | Freq: Once | ORAL | Status: AC
  Administered 2016-02-14: 100 mg via ORAL
  Filled 2016-02-14: qty 1

## 2016-02-14 MED ORDER — DOXYCYCLINE HYCLATE 100 MG PO CAPS: 100.0000 mg | ORAL_CAPSULE | Freq: Two times a day (BID) | ORAL | Status: DC

## 2016-02-14 MED ORDER — KETOROLAC TROMETHAMINE 60 MG/2ML IM SOLN
60.0000 mg | Freq: Once | INTRAMUSCULAR | Status: AC
  Administered 2016-02-14: 60 mg via INTRAMUSCULAR
  Filled 2016-02-14: qty 2

## 2016-02-14 MED ORDER — CEPHALEXIN 500 MG PO CAPS: 500.0000 mg | ORAL_CAPSULE | Freq: Four times a day (QID) | ORAL | Status: DC

## 2016-02-14 MED ORDER — DICLOFENAC SODIUM ER 100 MG PO TB24: 100.0000 mg | ORAL_TABLET | Freq: Every day | ORAL | Status: DC

## 2016-02-14 NOTE — Discharge Instructions (Signed)

## 2016-02-14 NOTE — ED Provider Notes (Signed)
CSN: [id number]     Arrival date & time 02/14/16  0258 History   First MD Initiated Contact with Patient 02/14/16 [phone]     Chief Complaint  Patient presents with  . Foot Swelling     (Consider location/radiation/quality/duration/timing/severity/associated sxs/prior Treatment) Patient is a 60 y.o. male presenting with lower extremity pain. The history is provided by the patient.  Foot Pain This is a new problem. The current episode started more than 2 days ago. The problem occurs constantly. The problem has not changed since onset.Pertinent negatives include no chest pain, no abdominal pain, no headaches and no shortness of breath. Nothing aggravates the symptoms. Nothing relieves the symptoms. The treatment provided no relief.  Dorsum of the left foot is red and swollen now redness is up onto the ankle.    Past Medical History  Diagnosis Date  . Gout   . Hypertension   . Diabetes mellitus without complication ([place])   . MI (myocardial infarction) ([place])   . Chronic bronchitis [facility])    Past Surgical History  Procedure Laterality Date  . Coronary artery bypass graft    . Knee surgery    . Rotator cuff repair    . Wrist surgery     History reviewed. No pertinent family history. Social History  Substance Use Topics  . Smoking status: Current Some Day Smoker -- 0.25 packs/day  . Smokeless tobacco: None  . Alcohol Use: No    Review of Systems  Respiratory: Negative for shortness of breath.   Cardiovascular: Negative for chest pain.  Gastrointestinal: Negative for abdominal pain.  Skin: Positive for color change.  Neurological: Negative for headaches.  All other systems reviewed and are negative.     Allergies  Hydrocodone  Home Medications   Prior to Admission medications   Medication Sig Start Date End Date Taking? Authorizing Provider  methocarbamol (ROBAXIN) 500 MG tablet Take 2 tablets (1,000 mg total) by mouth every 8 (eight) hours as needed for muscle spasms.  01/29/16   Julianne Rice, MD  oxyCODONE-acetaminophen (PERCOCET/ROXICET) 5-325 MG tablet Take 1-2 tablets by mouth every 4 (four) hours as needed for severe pain. 01/30/16   Junius Creamer, NP  predniSONE (DELTASONE) 10 MG tablet Take 2 tablets (20 mg total) by mouth daily. 01/30/16   Junius Creamer, NP   BP 149/84 mmHg  Pulse 82  Temp(Src) 99.5 F (37.5 C) (Oral)  Resp 20  Ht 5\' 10"  (1.778 m)  Wt 194 lb (87.998 kg)  BMI 27.84 kg/m2  SpO2 99% Physical Exam  Constitutional: He is oriented to person, place, and time. He appears well-developed and well-nourished. No distress.  HENT:  Head: Normocephalic and atraumatic.  Mouth/Throat: Oropharynx is clear and moist.  Eyes: Conjunctivae are normal. Pupils are equal, round, and reactive to light.  Neck: Normal range of motion. Neck supple.  Cardiovascular: Normal rate, regular rhythm and intact distal pulses.   Pulmonary/Chest: Effort normal and breath sounds normal. No respiratory distress. He has no wheezes. He has no rales.  Abdominal: Soft. Bowel sounds are normal. There is no tenderness. There is no rebound and no guarding.  Musculoskeletal: Normal range of motion.       Left ankle: Normal. Achilles tendon normal.       Left foot: There is normal capillary refill.  2 + dorsalis pedis from of the toes and foot achilles tendon is intact.  All compartments of the LLE are soft.  Patient has intact sensation  Neurological: He is alert and oriented  to person, place, and time.  Skin: Skin is warm and dry. There is erythema.  althetes foot between toes.  Skin is red and warm on the dorsum of foot and onto the ankle consistent with cellulitis    ED Course  Procedures (including critical care time) Labs Review Labs Reviewed - No data to display  Imaging Review No results found. I have personally reviewed and evaluated these images and lab results as part of my medical decision-making.   EKG Interpretation None      MDM   Final diagnoses:   None    Exam today is consistent with cellulitis.  Will treat with doxycycline and keflex.  Strict return precautions given. Return for fevers, vomiting, diarrhea, worsening redness, streaking up the leg or any concerns Will need to follow up with PMD for recheck     Kenney Going, MD 02/14/16 [id number]

## 2016-02-14 NOTE — ED Notes (Signed)
Presents with left foot swelling began a few days ago-unable to walk or put shoe on, unable to palpate pedal pulse, foot is warm to touch and painful. Pt is able to wiggle digits.

## 2016-04-25 ENCOUNTER — Emergency Department (HOSPITAL_COMMUNITY): Payer: Medicare Other

## 2016-04-25 ENCOUNTER — Encounter (HOSPITAL_COMMUNITY): Payer: Self-pay | Admitting: Emergency Medicine

## 2016-04-25 ENCOUNTER — Emergency Department (HOSPITAL_COMMUNITY)
Admission: EM | Admit: 2016-04-25 | Discharge: 2016-04-25 | Disposition: A | Discharge status: Home / Self Care | Payer: Medicare Other | Attending: Emergency Medicine | Admitting: Emergency Medicine

## 2016-04-25 DIAGNOSIS — Z7952 Long term (current) use of systemic steroids: Secondary | ICD-10-CM | POA: Diagnosis not present

## 2016-04-25 DIAGNOSIS — Z79891 Long term (current) use of opiate analgesic: Secondary | ICD-10-CM | POA: Insufficient documentation

## 2016-04-25 DIAGNOSIS — E119 Type 2 diabetes mellitus without complications: Secondary | ICD-10-CM | POA: Insufficient documentation

## 2016-04-25 DIAGNOSIS — F17200 Nicotine dependence, unspecified, uncomplicated: Secondary | ICD-10-CM | POA: Insufficient documentation

## 2016-04-25 DIAGNOSIS — I252 Old myocardial infarction: Secondary | ICD-10-CM | POA: Diagnosis not present

## 2016-04-25 DIAGNOSIS — I10 Essential (primary) hypertension: Secondary | ICD-10-CM | POA: Insufficient documentation

## 2016-04-25 DIAGNOSIS — M10041 Idiopathic gout, right hand: Secondary | ICD-10-CM | POA: Diagnosis not present

## 2016-04-25 DIAGNOSIS — M79641 Pain in right hand: Secondary | ICD-10-CM | POA: Diagnosis present

## 2016-04-25 DIAGNOSIS — Z951 Presence of aortocoronary bypass graft: Secondary | ICD-10-CM | POA: Insufficient documentation

## 2016-04-25 DIAGNOSIS — Z792 Long term (current) use of antibiotics: Secondary | ICD-10-CM | POA: Insufficient documentation

## 2016-04-25 DIAGNOSIS — Z79899 Other long term (current) drug therapy: Secondary | ICD-10-CM | POA: Diagnosis not present

## 2016-04-25 DIAGNOSIS — M7989 Other specified soft tissue disorders: Secondary | ICD-10-CM | POA: Diagnosis not present

## 2016-04-25 DIAGNOSIS — M109 Gout, unspecified: Secondary | ICD-10-CM | POA: Diagnosis not present

## 2016-04-25 MED ORDER — OXYCODONE-ACETAMINOPHEN 5-325 MG PO TABS: 1.0000 | ORAL_TABLET | ORAL | Status: DC | PRN

## 2016-04-25 MED ORDER — PREDNISONE 20 MG PO TABS: ORAL_TABLET | ORAL | Status: DC

## 2016-04-25 NOTE — ED Notes (Signed)
Pt c/o R hand pain x 2 days with swelling beginning yesterday. Denies recent injury.

## 2016-04-25 NOTE — ED Provider Notes (Signed)
CSN: [id number]     Arrival date & time 04/25/16  [id number] History   First MD Initiated Contact with Patient 04/25/16 [phone]     Chief Complaint  Patient presents with  . Hand Pain     (Consider location/radiation/quality/duration/timing/severity/associated sxs/prior Treatment) Patient is a 60 y.o. male presenting with hand pain. The history is provided by the patient and medical records.  Hand Pain Associated symptoms include arthralgias.    60 y.o. M with hx of gout, HTN, DM, MI, bronchitis, presenting to the ED for right hand pain.  He reports this began 3 days ago but has been worsening since this time.  He has some swelling of his dorsal right hand as well.  He denies any known injury or trauma to the hand.  Patient is right hand dominant.  He does have history of gout, reports this usually occurs in his left knee, however he has had in his right hand before.  He has not been taking any medications at home for his symptoms.  Past Medical History  Diagnosis Date  . Gout   . Hypertension   . Diabetes mellitus without complication ([place])   . MI (myocardial infarction) ([place])   . Chronic bronchitis [facility])    Past Surgical History  Procedure Laterality Date  . Coronary artery bypass graft    . Knee surgery    . Rotator cuff repair    . Wrist surgery     No family history on file. Social History  Substance Use Topics  . Smoking status: Current Some Day Smoker -- 0.25 packs/day  . Smokeless tobacco: None  . Alcohol Use: No    Review of Systems  Musculoskeletal: Positive for arthralgias.  All other systems reviewed and are negative.     Allergies  Hydrocodone  Home Medications   Prior to Admission medications   Medication Sig Start Date End Date Taking? Authorizing Provider  cephALEXin (KEFLEX) 500 MG capsule Take 1 capsule (500 mg total) by mouth 4 (four) times daily. 02/14/16   [name], MD  Diclofenac Sodium CR (VOLTAREN-XR) 100 MG 24 hr tablet Take 1 tablet (100 mg  total) by mouth daily. 02/14/16   [name], MD  doxycycline (VIBRAMYCIN) 100 MG capsule Take 1 capsule (100 mg total) by mouth 2 (two) times daily. One po bid x 7 days 02/14/16   [name], MD  methocarbamol (ROBAXIN) 500 MG tablet Take 2 tablets (1,000 mg total) by mouth every 8 (eight) hours as needed for muscle spasms. 01/29/16   Julianne Rice, MD  oxyCODONE-acetaminophen (PERCOCET/ROXICET) 5-325 MG tablet Take 1-2 tablets by mouth every 4 (four) hours as needed for severe pain. 01/30/16   Junius Creamer, NP  predniSONE (DELTASONE) 10 MG tablet Take 2 tablets (20 mg total) by mouth daily. 01/30/16   Junius Creamer, NP   BP 147/90 mmHg  Pulse 60  Temp(Src) 97.7 F (36.5 C) (Oral)  Resp 20  Ht 5\' 10"  (1.778 m)  Wt 90.719 kg  BMI 28.70 kg/m2  SpO2 97%   Physical Exam  Constitutional: He is oriented to person, place, and time. He appears well-developed and well-nourished.  HENT:  Head: Normocephalic and atraumatic.  Mouth/Throat: Oropharynx is clear and moist.  Eyes: Conjunctivae and EOM are normal. Pupils are equal, round, and reactive to light.  Neck: Normal range of motion.  Cardiovascular: Normal rate, regular rhythm and normal heart sounds.   Pulmonary/Chest: Effort normal and breath sounds normal. No respiratory distress. He has no wheezes.  Musculoskeletal: Normal range of motion.  Mild swelling and tenderness of dorsal right hand without extension into wrist, no bony deformity noted, warmth to touch without overlying erythema, limited range of motion of fingers due to pain, strong radial pulse and cap refill, normal sensation throughout hand  Neurological: He is alert and oriented to person, place, and time.  Skin: Skin is warm and dry.  Psychiatric: He has a normal mood and affect.  Nursing note and vitals reviewed.   ED Course  Procedures (including critical care time) Labs Review Labs Reviewed - No data to display  Imaging Review Dg Hand Complete Right  04/25/2016   CLINICAL DATA:  Diffuse hand swelling and pain. No history of injury. Gout. EXAM: RIGHT HAND - COMPLETE 3+ VIEW COMPARISON:  None. FINDINGS: Soft tissue swelling is suggested at the dorsal distal metacarpals and medial wrist. There is no acute fracture or subluxation. Advanced diffuse intercarpal and radiocarpal narrowing with cystic or erosive changes, potentially from patient's gout. No calcified tophus. IMPRESSION: 1. Hand swelling without acute osseous finding. 2. Advanced intercarpal and radiocarpal arthritis, potentially from patient's gout. Electronically Signed   By: Monte Fantasia M.D.   On: 04/25/2016 04:48   I have personally reviewed and evaluated these images and lab results as part of my medical decision-making.   EKG Interpretation None      MDM   Final diagnoses:  Acute gout of right hand, unspecified cause   60 year old male here with right hand pain. No reported injury or trauma. Mild swelling and warmth to touch noted without erythema.  Patient afebrile, non-toxic.  X-ray with advanced arthritis, no acute bony findings.  I suspect patient's symptoms are due to gout flare.  Do not suspect septic joint at this time.  Most recent SrCr was slightly elevated so will avoid NSAIDs.  Will start on prednisone and percocet.  Encouraged follow-up with PCP.  Discussed plan with patient, he/she acknowledged understanding and agreed with plan of care.  Return precautions given for new or worsening symptoms.  Larene Pickett, PA-C 04/25/16 [place], MD 04/25/16 [phone]

## 2016-04-25 NOTE — Discharge Instructions (Signed)
Take the prescribed medication as directed.  Use caution when taking percocet, it can make you sleepy/drowsy. Follow-up with your primary care doctor. Return to the ED for new or worsening symptoms.  Gout Gout is an inflammatory arthritis caused by a buildup of uric acid crystals in the joints. Uric acid is a chemical that is normally present in the blood. When the level of uric acid in the blood is too high it can form crystals that deposit in your joints and tissues. This causes joint redness, soreness, and swelling (inflammation). Repeat attacks are common. Over time, uric acid crystals can form into masses (tophi) near a joint, destroying bone and causing disfigurement. Gout is treatable and often preventable. CAUSES  The disease begins with elevated levels of uric acid in the blood. Uric acid is produced by your body when it breaks down a naturally found substance called purines. Certain foods you eat, such as meats and fish, contain high amounts of purines. Causes of an elevated uric acid level include:  Being passed down from parent to child (heredity).  Diseases that cause increased uric acid production (such as obesity, psoriasis, and certain cancers).  Excessive alcohol use.  Diet, especially diets rich in meat and seafood.  Medicines, including certain cancer-fighting medicines (chemotherapy), water pills (diuretics), and aspirin.  Chronic kidney disease. The kidneys are no longer able to remove uric acid well.  Problems with metabolism. Conditions strongly associated with gout include:  Obesity.  High blood pressure.  High cholesterol.  Diabetes. Not everyone with elevated uric acid levels gets gout. It is not understood why some people get gout and others do not. Surgery, joint injury, and eating too much of certain foods are some of the factors that can lead to gout attacks. SYMPTOMS   An attack of gout comes on quickly. It causes intense pain with redness, swelling,  and warmth in a joint.  Fever can occur.  Often, only one joint is involved. Certain joints are more commonly involved:  Base of the big toe.  Knee.  Ankle.  Wrist.  Finger. Without treatment, an attack usually goes away in a few days to weeks. Between attacks, you usually will not have symptoms, which is different from many other forms of arthritis. DIAGNOSIS  Your caregiver will suspect gout based on your symptoms and exam. In some cases, tests may be recommended. The tests may include:  Blood tests.  Urine tests.  X-rays.  Joint fluid exam. This exam requires a needle to remove fluid from the joint (arthrocentesis). Using a microscope, gout is confirmed when uric acid crystals are seen in the joint fluid. TREATMENT  There are two phases to gout treatment: treating the sudden onset (acute) attack and preventing attacks (prophylaxis).  Treatment of an Acute Attack.  Medicines are used. These include anti-inflammatory medicines or steroid medicines.  An injection of steroid medicine into the affected joint is sometimes necessary.  The painful joint is rested. Movement can worsen the arthritis.  You may use warm or cold treatments on painful joints, depending which works best for you.  Treatment to Prevent Attacks.  If you suffer from frequent gout attacks, your caregiver may advise preventive medicine. These medicines are started after the acute attack subsides. These medicines either help your kidneys eliminate uric acid from your body or decrease your uric acid production. You may need to stay on these medicines for a very long time.  The early phase of treatment with preventive medicine can be associated with an  increase in acute gout attacks. For this reason, during the first few months of treatment, your caregiver may also advise you to take medicines usually used for acute gout treatment. Be sure you understand your caregiver's directions. Your caregiver may make  several adjustments to your medicine dose before these medicines are effective.  Discuss dietary treatment with your caregiver or dietitian. Alcohol and drinks high in sugar and fructose and foods such as meat, poultry, and seafood can increase uric acid levels. Your caregiver or dietitian can advise you on drinks and foods that should be limited. HOME CARE INSTRUCTIONS   Do not take aspirin to relieve pain. This raises uric acid levels.  Only take over-the-counter or prescription medicines for pain, discomfort, or fever as directed by your caregiver.  Rest the joint as much as possible. When in bed, keep sheets and blankets off painful areas.  Keep the affected joint raised (elevated).  Apply warm or cold treatments to painful joints. Use of warm or cold treatments depends on which works best for you.  Use crutches if the painful joint is in your leg.  Drink enough fluids to keep your urine clear or pale yellow. This helps your body get rid of uric acid. Limit alcohol, sugary drinks, and fructose drinks.  Follow your dietary instructions. Pay careful attention to the amount of protein you eat. Your daily diet should emphasize fruits, vegetables, whole grains, and fat-free or low-fat milk products. Discuss the use of coffee, vitamin C, and cherries with your caregiver or dietitian. These may be helpful in lowering uric acid levels.  Maintain a healthy body weight. SEEK MEDICAL CARE IF:   You develop diarrhea, vomiting, or any side effects from medicines.  You do not feel better in 24 hours, or you are getting worse. SEEK IMMEDIATE MEDICAL CARE IF:   Your joint becomes suddenly more tender, and you have chills or a fever. MAKE SURE YOU:   Understand these instructions.  Will watch your condition.  Will get help right away if you are not doing well or get worse.   This information is not intended to replace advice given to you by your health care provider. Make sure you discuss  any questions you have with your health care provider.   Document Released: 12/11/2000 Document Revised: 01/04/2015 Document Reviewed: 07/27/2012 Elsevier Interactive Patient Education [employer].

## 2016-04-29 ENCOUNTER — Emergency Department (HOSPITAL_COMMUNITY): Payer: Medicare Other

## 2016-04-29 ENCOUNTER — Emergency Department (HOSPITAL_COMMUNITY)
Admission: EM | Admit: 2016-04-29 | Discharge: 2016-04-29 | Disposition: A | Discharge status: Home / Self Care | Payer: Medicare Other | Attending: Emergency Medicine | Admitting: Emergency Medicine

## 2016-04-29 ENCOUNTER — Encounter (HOSPITAL_COMMUNITY): Payer: Self-pay | Admitting: Emergency Medicine

## 2016-04-29 DIAGNOSIS — Z7984 Long term (current) use of oral hypoglycemic drugs: Secondary | ICD-10-CM | POA: Diagnosis not present

## 2016-04-29 DIAGNOSIS — E119 Type 2 diabetes mellitus without complications: Secondary | ICD-10-CM | POA: Insufficient documentation

## 2016-04-29 DIAGNOSIS — Z79899 Other long term (current) drug therapy: Secondary | ICD-10-CM | POA: Diagnosis not present

## 2016-04-29 DIAGNOSIS — Z7952 Long term (current) use of systemic steroids: Secondary | ICD-10-CM | POA: Diagnosis not present

## 2016-04-29 DIAGNOSIS — I10 Essential (primary) hypertension: Secondary | ICD-10-CM | POA: Diagnosis not present

## 2016-04-29 DIAGNOSIS — J42 Unspecified chronic bronchitis: Secondary | ICD-10-CM | POA: Diagnosis not present

## 2016-04-29 DIAGNOSIS — Z79891 Long term (current) use of opiate analgesic: Secondary | ICD-10-CM | POA: Diagnosis not present

## 2016-04-29 DIAGNOSIS — Z791 Long term (current) use of non-steroidal anti-inflammatories (NSAID): Secondary | ICD-10-CM | POA: Diagnosis not present

## 2016-04-29 DIAGNOSIS — I252 Old myocardial infarction: Secondary | ICD-10-CM | POA: Insufficient documentation

## 2016-04-29 DIAGNOSIS — R079 Chest pain, unspecified: Secondary | ICD-10-CM | POA: Diagnosis present

## 2016-04-29 DIAGNOSIS — Z7951 Long term (current) use of inhaled steroids: Secondary | ICD-10-CM | POA: Insufficient documentation

## 2016-04-29 DIAGNOSIS — Z955 Presence of coronary angioplasty implant and graft: Secondary | ICD-10-CM | POA: Diagnosis not present

## 2016-04-29 DIAGNOSIS — R0602 Shortness of breath: Secondary | ICD-10-CM | POA: Diagnosis not present

## 2016-04-29 DIAGNOSIS — R0789 Other chest pain: Secondary | ICD-10-CM | POA: Diagnosis not present

## 2016-04-29 DIAGNOSIS — F17200 Nicotine dependence, unspecified, uncomplicated: Secondary | ICD-10-CM | POA: Insufficient documentation

## 2016-04-29 LAB — CBC
HEMATOCRIT: 39 % (ref 39.0–52.0)
HEMOGLOBIN: 13.6 g/dL (ref 13.0–17.0)
MCH: 31.1 pg (ref 26.0–34.0)
MCHC: 34.9 g/dL (ref 30.0–36.0)
MCV: 89 fL (ref 78.0–100.0)
Platelets: 293 10*3/uL (ref 150–400)
RBC: 4.38 MIL/uL (ref 4.22–5.81)
RDW: 14.7 % (ref 11.5–15.5)
WBC: 11.3 10*3/uL — ABNORMAL HIGH (ref 4.0–10.5)

## 2016-04-29 LAB — BASIC METABOLIC PANEL
ANION GAP: 11 (ref 5–15)
BUN: 25 mg/dL — ABNORMAL HIGH (ref 6–20)
CALCIUM: 8.7 mg/dL — AB (ref 8.9–10.3)
CHLORIDE: 101 mmol/L (ref 101–111)
CO2: 26 mmol/L (ref 22–32)
Creatinine, Ser: 1.4 mg/dL — ABNORMAL HIGH (ref 0.61–1.24)
GFR calc non Af Amer: 53 mL/min — ABNORMAL LOW (ref >60)
GLUCOSE: 193 mg/dL — AB (ref 65–99)
POTASSIUM: 3.4 mmol/L — AB (ref 3.5–5.1)
Sodium: 138 mmol/L (ref 135–145)

## 2016-04-29 LAB — I-STAT TROPONIN, ED: TROPONIN I, POC: 0.01 ng/mL (ref 0.00–0.08)

## 2016-04-29 MED ORDER — FAMOTIDINE 20 MG PO TABS: 20.0000 mg | ORAL_TABLET | Freq: Two times a day (BID) | ORAL | Status: DC

## 2016-04-29 MED ORDER — DIPHENHYDRAMINE HCL 25 MG PO CAPS
25.0000 mg | ORAL_CAPSULE | Freq: Once | ORAL | Status: AC
  Administered 2016-04-29: 25 mg via ORAL
  Filled 2016-04-29: qty 1

## 2016-04-29 MED ORDER — OXYCODONE-ACETAMINOPHEN 5-325 MG PO TABS: 1.0000 | ORAL_TABLET | Freq: Once | ORAL | Status: DC

## 2016-04-29 MED ORDER — METHOCARBAMOL 500 MG PO TABS: 1000.0000 mg | ORAL_TABLET | Freq: Three times a day (TID) | ORAL | Status: DC | PRN

## 2016-04-29 MED ORDER — COLCHICINE 0.6 MG PO TABS: 0.6000 mg | ORAL_TABLET | Freq: Two times a day (BID) | ORAL | Status: DC

## 2016-04-29 NOTE — ED Notes (Signed)
Patient here from home with complaints of chest pain that started yesterday. Smoker. Denies nausea/vomiting. Hx of "triple bypass".

## 2016-04-29 NOTE — ED Notes (Signed)
Patient c/o right mid chest pain since 0100 this morning.  Patient states pain is sharp, constant, and increased with palpation and movement.  Patient denies SHOB, nausea, vomiting, diaphoresis.  Patient states lower extremities have been swollen for the past few days.  Patient recently started taking prednisone for gout in [place].

## 2016-04-29 NOTE — Discharge Instructions (Signed)

## 2016-04-29 NOTE — ED Provider Notes (Signed)
CSN: [id number]     Arrival date & time 04/29/16  1041 History   First MD Initiated Contact with Patient 04/29/16 1143     Chief Complaint  Patient presents with  . Chest Pain   HPI Pt was seen in the ED a few days ago for joint pain and swelling.  He was diagnosed with gout and prescribed medications.  That continues but he started having pain in his chest.  The pain is in the right side.  The pain is throbbing in nature.  Gets worse with movement or coughing.  No shortness of breath.  No leg swelling. Past Medical History  Diagnosis Date  . Gout   . Hypertension   . Diabetes mellitus without complication ([place])   . MI (myocardial infarction) ([place])   . Chronic bronchitis [facility])    Past Surgical History  Procedure Laterality Date  . Coronary artery bypass graft    . Knee surgery    . Rotator cuff repair    . Wrist surgery     History reviewed. No pertinent family history. Social History  Substance Use Topics  . Smoking status: Current Some Day Smoker -- 0.25 packs/day  . Smokeless tobacco: None  . Alcohol Use: No    Review of Systems  All other systems reviewed and are negative.     Allergies  Hydrocodone  Home Medications   Prior to Admission medications   Medication Sig Start Date End Date Taking? Authorizing Provider  albuterol (PROVENTIL) (2.5 MG/3ML) 0.083% nebulizer solution Take 2.5 mg by nebulization every 6 (six) hours as needed for wheezing or shortness of breath.   Yes Historical Provider, MD  atorvastatin (LIPITOR) 80 MG tablet Take 80 mg by mouth daily. 03/13/15  Yes Historical Provider, MD  carvedilol (COREG) 6.25 MG tablet Take 6.25 mg by mouth 2 (two) times daily. 10/25/15  Yes Historical Provider, MD  Diclofenac Sodium CR (VOLTAREN-XR) 100 MG 24 hr tablet Take 1 tablet (100 mg total) by mouth daily. Patient taking differently: Take 100 mg by mouth daily as needed for pain.  02/14/16  Yes [name], MD  furosemide (LASIX) 80 MG tablet Take 80 mg by  mouth daily. 10/25/15  Yes Historical Provider, MD  isosorbide mononitrate (IMDUR) 30 MG 24 hr tablet Take 30 mg by mouth daily. 10/25/15  Yes Historical Provider, MD  lisinopril (PRINIVIL,ZESTRIL) 40 MG tablet Take 40 mg by mouth daily. 03/27/16  Yes Historical Provider, MD  metFORMIN (GLUCOPHAGE) 1000 MG tablet Take 1,000 mg by mouth 2 (two) times daily. 10/25/15  Yes Historical Provider, MD  naproxen sodium (ANAPROX) 220 MG tablet Take 440 mg by mouth daily as needed (pain).   Yes Historical Provider, MD  NIFEdipine (PROCARDIA-XL/ADALAT CC) 60 MG 24 hr tablet Take 60 mg by mouth daily. 04/08/16  Yes Historical Provider, MD  oxyCODONE-acetaminophen (PERCOCET/ROXICET) 5-325 MG tablet Take 1 tablet by mouth every 4 (four) hours as needed. 04/25/16  Yes Larene Pickett, PA-C  colchicine 0.6 MG tablet Take 1 tablet (0.6 mg total) by mouth 2 (two) times daily. 04/29/16   Dorie Rank, MD  famotidine (PEPCID) 20 MG tablet Take 1 tablet (20 mg total) by mouth 2 (two) times daily. 04/29/16   Dorie Rank, MD  methocarbamol (ROBAXIN) 500 MG tablet Take 2 tablets (1,000 mg total) by mouth every 8 (eight) hours as needed for muscle spasms. 04/29/16   Dorie Rank, MD  predniSONE (DELTASONE) 20 MG tablet Take 40 mg by mouth daily for 3 days,  then 20mg  by mouth daily for 3 days, then 10mg  daily for 3 days Patient not taking: Reported on 04/29/2016 04/25/16   Larene Pickett, PA-C   BP 166/98 mmHg  Pulse 65  Temp(Src) 98.5 F (36.9 C) (Oral)  Resp 16  Ht 5\' 10"  (1.778 m)  Wt 90.719 kg  BMI 28.70 kg/m2  SpO2 99% Physical Exam  Constitutional: He appears well-developed and well-nourished. No distress.  HENT:  Head: Normocephalic and atraumatic.  Right Ear: External ear normal.  Left Ear: External ear normal.  Eyes: Conjunctivae are normal. Right eye exhibits no discharge. Left eye exhibits no discharge. No scleral icterus.  Neck: Neck supple. No tracheal deviation present.  Cardiovascular: Normal rate, regular rhythm and  intact distal pulses.   Pulmonary/Chest: Effort normal and breath sounds normal. No stridor. No respiratory distress. He has no wheezes. He has no rales. He exhibits tenderness.  Abdominal: Soft. Bowel sounds are normal. He exhibits no distension. There is no tenderness. There is no rebound and no guarding.  Musculoskeletal: He exhibits edema and tenderness.  ttp in knees, mild edema in legs  Neurological: He is alert. He has normal strength. No cranial nerve deficit (no facial droop, extraocular movements intact, no slurred speech) or sensory deficit. He exhibits normal muscle tone. He displays no seizure activity. Coordination normal.  Skin: Skin is warm and dry. No rash noted.  Psychiatric: He has a normal mood and affect.  Nursing note and vitals reviewed.   ED Course  Procedures (including critical care time) Labs Review Labs Reviewed  BASIC METABOLIC PANEL - Abnormal; Notable for the following:    Potassium 3.4 (*)    Glucose, Bld 193 (*)    BUN 25 (*)    Creatinine, Ser 1.40 (*)    Calcium 8.7 (*)    GFR calc non Af Amer 53 (*)    All other components within normal limits  CBC - Abnormal; Notable for the following:    WBC 11.3 (*)    All other components within normal limits  I-STAT TROPOININ, ED    Imaging Review Dg Chest 2 View  04/29/2016  CLINICAL DATA:  New onset of right mid chest pain last night with shortness of breath. Previous CABG. EXAM: CHEST  2 VIEW COMPARISON:  10/12/2015 FINDINGS: There is chronic cardiomegaly. Pulmonary vascularity is normal and the lungs are clear of infiltrates and effusions. No acute bone abnormality. IMPRESSION: No acute abnormality.  Chronic cardiomegaly. Electronically Signed   By: Lorriane Shire M.D.   On: 04/29/2016 12:44   I have personally reviewed and evaluated these images and lab results as part of my medical decision-making.   EKG Interpretation   Date/Time:  Wednesday Apr 29 2016 10:51:37 EDT Ventricular Rate:  77 PR  Interval:  163 QRS Duration: 162 QT Interval:  403 QTC Calculation: 456 R Axis:   -33 Text Interpretation:  Sinus rhythm Multiple ventricular premature  complexes Left ventricular hypertrophy with strain pattern No significant  change since last tracing Confirmed by Hideo Googe  MD-J, Sidney Silberman [id number]) on  04/29/2016 11:43:59 AM      MDM   Final diagnoses:  Chest wall pain    Patient has chest wall tenderness associated with a recent gout attack. Patient's symptoms have been ongoing for 4 days. He's had constant chest pain and has negative cardiac enzymes and a reassuring EKG. The symptoms are related to a cardiac etiology.  Chest x-ray does not show a pneumonia. He does not have a pneumothorax.  Patient has finished a course of steroids. I think it's reasonable for him to start colchicine considering his persistent gout pain. Possible this chest pain may be related to a gout. He could also be having some acid reflux  Discharge home with colchicine and Pepcid. I encouraged the patient to follow up with primary care doctor. As he has not been seeing one since his primary care doctor is still in [facility]. He has been living in [place] for the last 6-9 months. It's reasonable for him to establish care in this area   Dorie Rank, MD 04/29/16 1326

## 2016-05-16 ENCOUNTER — Emergency Department (HOSPITAL_BASED_OUTPATIENT_CLINIC_OR_DEPARTMENT_OTHER)
Admission: EM | Admit: 2016-05-16 | Discharge: 2016-05-16 | Disposition: A | Discharge status: Home / Self Care | Payer: Medicare Other | Attending: Emergency Medicine | Admitting: Emergency Medicine

## 2016-05-16 DIAGNOSIS — I252 Old myocardial infarction: Secondary | ICD-10-CM | POA: Insufficient documentation

## 2016-05-16 DIAGNOSIS — I10 Essential (primary) hypertension: Secondary | ICD-10-CM | POA: Insufficient documentation

## 2016-05-16 DIAGNOSIS — Z7984 Long term (current) use of oral hypoglycemic drugs: Secondary | ICD-10-CM | POA: Diagnosis not present

## 2016-05-16 DIAGNOSIS — M109 Gout, unspecified: Secondary | ICD-10-CM | POA: Diagnosis not present

## 2016-05-16 DIAGNOSIS — F17200 Nicotine dependence, unspecified, uncomplicated: Secondary | ICD-10-CM | POA: Insufficient documentation

## 2016-05-16 DIAGNOSIS — E119 Type 2 diabetes mellitus without complications: Secondary | ICD-10-CM | POA: Insufficient documentation

## 2016-05-16 DIAGNOSIS — Z79899 Other long term (current) drug therapy: Secondary | ICD-10-CM | POA: Diagnosis not present

## 2016-05-16 DIAGNOSIS — M25521 Pain in right elbow: Secondary | ICD-10-CM | POA: Diagnosis present

## 2016-05-16 MED ORDER — PREDNISONE 50 MG PO TABS
60.0000 mg | ORAL_TABLET | Freq: Once | ORAL | Status: AC
  Administered 2016-05-16: 60 mg via ORAL
  Filled 2016-05-16: qty 1

## 2016-05-16 MED ORDER — OXYCODONE-ACETAMINOPHEN 5-325 MG PO TABS: 1.0000 | ORAL_TABLET | ORAL | Status: DC | PRN

## 2016-05-16 MED ORDER — PREDNISONE 20 MG PO TABS: ORAL_TABLET | ORAL | Status: DC

## 2016-05-16 MED ORDER — OXYCODONE-ACETAMINOPHEN 5-325 MG PO TABS
2.0000 | ORAL_TABLET | Freq: Once | ORAL | Status: AC
  Administered 2016-05-16: 2 via ORAL
  Filled 2016-05-16: qty 2

## 2016-05-16 NOTE — ED Provider Notes (Signed)
CSN: [id number]     Arrival date & time 05/16/16  0112 History   First MD Initiated Contact with Patient 05/16/16 0455     Chief Complaint  Patient presents with  . Elbow Pain     (Consider location/radiation/quality/duration/timing/severity/associated sxs/prior Treatment) HPI  This is a 60 year old male with a history of gout. He is here with right elbow pain 2 days, worse this morning. Pain is consistent with previous gout. He has been taking naproxen without relief. Pain is worse with movement and is described as severe. There is associated swelling of the right elbow. Range of motion of the right elbow is significantly limited due to pain.  Past Medical History  Diagnosis Date  . Gout   . Hypertension   . Diabetes mellitus without complication ([place])   . MI (myocardial infarction) ([place])   . Chronic bronchitis [facility])    Past Surgical History  Procedure Laterality Date  . Coronary artery bypass graft    . Knee surgery    . Rotator cuff repair    . Wrist surgery     No family history on file. Social History  Substance Use Topics  . Smoking status: Current Some Day Smoker -- 0.25 packs/day  . Smokeless tobacco: Not on file  . Alcohol Use: No    Review of Systems  All other systems reviewed and are negative.   Allergies  Hydrocodone  Home Medications   Prior to Admission medications   Medication Sig Start Date End Date Taking? Authorizing Provider  albuterol (PROVENTIL) (2.5 MG/3ML) 0.083% nebulizer solution Take 2.5 mg by nebulization every 6 (six) hours as needed for wheezing or shortness of breath.    Historical Provider, MD  atorvastatin (LIPITOR) 80 MG tablet Take 80 mg by mouth daily. 03/13/15   Historical Provider, MD  carvedilol (COREG) 6.25 MG tablet Take 6.25 mg by mouth 2 (two) times daily. 10/25/15   Historical Provider, MD  colchicine 0.6 MG tablet Take 1 tablet (0.6 mg total) by mouth 2 (two) times daily. 04/29/16   Dorie Rank, MD  Diclofenac Sodium CR  (VOLTAREN-XR) 100 MG 24 hr tablet Take 1 tablet (100 mg total) by mouth daily. Patient taking differently: Take 100 mg by mouth daily as needed for pain.  02/14/16   [name], MD  famotidine (PEPCID) 20 MG tablet Take 1 tablet (20 mg total) by mouth 2 (two) times daily. 04/29/16   Dorie Rank, MD  furosemide (LASIX) 80 MG tablet Take 80 mg by mouth daily. 10/25/15   Historical Provider, MD  isosorbide mononitrate (IMDUR) 30 MG 24 hr tablet Take 30 mg by mouth daily. 10/25/15   Historical Provider, MD  lisinopril (PRINIVIL,ZESTRIL) 40 MG tablet Take 40 mg by mouth daily. 03/27/16   Historical Provider, MD  metFORMIN (GLUCOPHAGE) 1000 MG tablet Take 1,000 mg by mouth 2 (two) times daily. 10/25/15   Historical Provider, MD  methocarbamol (ROBAXIN) 500 MG tablet Take 2 tablets (1,000 mg total) by mouth every 8 (eight) hours as needed for muscle spasms. 04/29/16   Dorie Rank, MD  naproxen sodium (ANAPROX) 220 MG tablet Take 440 mg by mouth daily as needed (pain).    Historical Provider, MD  NIFEdipine (PROCARDIA-XL/ADALAT CC) 60 MG 24 hr tablet Take 60 mg by mouth daily. 04/08/16   Historical Provider, MD  oxyCODONE-acetaminophen (PERCOCET/ROXICET) 5-325 MG tablet Take 1 tablet by mouth every 4 (four) hours as needed. 04/25/16   Larene Pickett, PA-C  predniSONE (DELTASONE) 20 MG tablet Take 40  mg by mouth daily for 3 days, then 20mg  by mouth daily for 3 days, then 10mg  daily for 3 days Patient not taking: Reported on 04/29/2016 04/25/16   Larene Pickett, PA-C   BP 152/75 mmHg  Pulse 75  Temp(Src) 98.5 F (36.9 C) (Oral)  Resp 16  SpO2 99%   Physical Exam General: Well-developed, well-nourished male in no acute distress; appearance consistent with age of record HENT: normocephalic; atraumatic Eyes: pupils equal, round and reactive to light; extraocular muscles intact Neck: supple Heart: regular rate and rhythm Lungs: clear to auscultation bilaterally Abdomen: soft; nondistended Extremities: No  deformity; normal range of motion except right elbow limited by pain and swelling; pulses normal; tenderness and swelling of right elbow without erythema or warmth Neurologic: Awake, alert and oriented; motor function intact in all extremities and symmetric; no facial droop Skin: Warm and dry Psychiatric: Flat affect    ED Course  Procedures (including critical care time)   MDM     Shanon Rosser, MD 05/16/16 [id number]

## 2016-05-16 NOTE — Discharge Instructions (Signed)

## 2016-05-16 NOTE — ED Notes (Signed)
Pt c/o right elbow pain x2 days worsen tonight naproxen ineffective pt reports Hx gout

## 2016-05-31 DIAGNOSIS — M10041 Idiopathic gout, right hand: Secondary | ICD-10-CM | POA: Diagnosis not present

## 2016-05-31 DIAGNOSIS — M109 Gout, unspecified: Secondary | ICD-10-CM | POA: Diagnosis not present

## 2016-07-06 DIAGNOSIS — E119 Type 2 diabetes mellitus without complications: Secondary | ICD-10-CM | POA: Diagnosis not present

## 2016-07-06 DIAGNOSIS — M10072 Idiopathic gout, left ankle and foot: Secondary | ICD-10-CM | POA: Diagnosis not present

## 2016-07-06 DIAGNOSIS — I25119 Atherosclerotic heart disease of native coronary artery with unspecified angina pectoris: Secondary | ICD-10-CM | POA: Diagnosis not present

## 2016-07-06 DIAGNOSIS — E789 Disorder of lipoprotein metabolism, unspecified: Secondary | ICD-10-CM | POA: Diagnosis not present

## 2016-07-06 DIAGNOSIS — I10 Essential (primary) hypertension: Secondary | ICD-10-CM | POA: Diagnosis not present

## 2016-07-06 DIAGNOSIS — N183 Chronic kidney disease, stage 3 (moderate): Secondary | ICD-10-CM | POA: Diagnosis not present

## 2016-07-08 DIAGNOSIS — I255 Ischemic cardiomyopathy: Secondary | ICD-10-CM | POA: Diagnosis not present

## 2016-07-08 DIAGNOSIS — Z955 Presence of coronary angioplasty implant and graft: Secondary | ICD-10-CM | POA: Diagnosis not present

## 2016-07-08 DIAGNOSIS — T380X5A Adverse effect of glucocorticoids and synthetic analogues, initial encounter: Secondary | ICD-10-CM | POA: Diagnosis present

## 2016-07-08 DIAGNOSIS — N401 Enlarged prostate with lower urinary tract symptoms: Secondary | ICD-10-CM | POA: Diagnosis present

## 2016-07-08 DIAGNOSIS — N183 Chronic kidney disease, stage 3 (moderate): Secondary | ICD-10-CM | POA: Diagnosis not present

## 2016-07-08 DIAGNOSIS — Z743 Need for continuous supervision: Secondary | ICD-10-CM | POA: Diagnosis not present

## 2016-07-08 DIAGNOSIS — I25119 Atherosclerotic heart disease of native coronary artery with unspecified angina pectoris: Secondary | ICD-10-CM | POA: Diagnosis not present

## 2016-07-08 DIAGNOSIS — I272 Other secondary pulmonary hypertension: Secondary | ICD-10-CM | POA: Diagnosis not present

## 2016-07-08 DIAGNOSIS — R0602 Shortness of breath: Secondary | ICD-10-CM | POA: Diagnosis not present

## 2016-07-08 DIAGNOSIS — F17210 Nicotine dependence, cigarettes, uncomplicated: Secondary | ICD-10-CM | POA: Diagnosis present

## 2016-07-08 DIAGNOSIS — M10072 Idiopathic gout, left ankle and foot: Secondary | ICD-10-CM | POA: Diagnosis present

## 2016-07-08 DIAGNOSIS — I2510 Atherosclerotic heart disease of native coronary artery without angina pectoris: Secondary | ICD-10-CM | POA: Diagnosis present

## 2016-07-08 DIAGNOSIS — I130 Hypertensive heart and chronic kidney disease with heart failure and stage 1 through stage 4 chronic kidney disease, or unspecified chronic kidney disease: Secondary | ICD-10-CM | POA: Diagnosis present

## 2016-07-08 DIAGNOSIS — E1122 Type 2 diabetes mellitus with diabetic chronic kidney disease: Secondary | ICD-10-CM | POA: Diagnosis present

## 2016-07-08 DIAGNOSIS — I25118 Atherosclerotic heart disease of native coronary artery with other forms of angina pectoris: Secondary | ICD-10-CM | POA: Diagnosis not present

## 2016-07-08 DIAGNOSIS — E7800 Pure hypercholesterolemia, unspecified: Secondary | ICD-10-CM

## 2016-07-08 DIAGNOSIS — I252 Old myocardial infarction: Secondary | ICD-10-CM | POA: Diagnosis not present

## 2016-07-08 DIAGNOSIS — E1165 Type 2 diabetes mellitus with hyperglycemia: Secondary | ICD-10-CM | POA: Diagnosis present

## 2016-07-08 DIAGNOSIS — I214 Non-ST elevation (NSTEMI) myocardial infarction: Secondary | ICD-10-CM

## 2016-07-08 DIAGNOSIS — R072 Precordial pain: Secondary | ICD-10-CM | POA: Diagnosis not present

## 2016-07-08 DIAGNOSIS — I10 Essential (primary) hypertension: Secondary | ICD-10-CM | POA: Diagnosis not present

## 2016-07-08 DIAGNOSIS — I351 Nonrheumatic aortic (valve) insufficiency: Secondary | ICD-10-CM | POA: Diagnosis present

## 2016-07-08 DIAGNOSIS — Z951 Presence of aortocoronary bypass graft: Secondary | ICD-10-CM | POA: Diagnosis not present

## 2016-07-08 DIAGNOSIS — I498 Other specified cardiac arrhythmias: Secondary | ICD-10-CM | POA: Diagnosis not present

## 2016-07-08 DIAGNOSIS — Z7984 Long term (current) use of oral hypoglycemic drugs: Secondary | ICD-10-CM | POA: Diagnosis not present

## 2016-07-08 DIAGNOSIS — R079 Chest pain, unspecified: Secondary | ICD-10-CM | POA: Diagnosis not present

## 2016-07-08 DIAGNOSIS — M1000 Idiopathic gout, unspecified site: Secondary | ICD-10-CM | POA: Diagnosis not present

## 2016-07-08 DIAGNOSIS — I517 Cardiomegaly: Secondary | ICD-10-CM | POA: Diagnosis not present

## 2016-07-08 DIAGNOSIS — E785 Hyperlipidemia, unspecified: Secondary | ICD-10-CM | POA: Diagnosis present

## 2016-07-08 DIAGNOSIS — I5022 Chronic systolic (congestive) heart failure: Secondary | ICD-10-CM | POA: Diagnosis present

## 2016-07-08 DIAGNOSIS — E119 Type 2 diabetes mellitus without complications: Secondary | ICD-10-CM | POA: Diagnosis not present

## 2016-07-08 HISTORY — DX: Pure hypercholesterolemia, unspecified: E78.00

## 2016-07-08 HISTORY — DX: Non-ST elevation (NSTEMI) myocardial infarction: I21.4

## 2016-08-10 ENCOUNTER — Emergency Department (HOSPITAL_COMMUNITY)
Admission: EM | Admit: 2016-08-10 | Discharge: 2016-08-11 | Disposition: A | Discharge status: Home / Self Care | Payer: Medicare Other | Attending: Emergency Medicine | Admitting: Emergency Medicine

## 2016-08-10 ENCOUNTER — Encounter (HOSPITAL_COMMUNITY): Payer: Self-pay | Admitting: Emergency Medicine

## 2016-08-10 ENCOUNTER — Emergency Department (HOSPITAL_COMMUNITY): Payer: Medicare Other

## 2016-08-10 DIAGNOSIS — I5022 Chronic systolic (congestive) heart failure: Secondary | ICD-10-CM | POA: Diagnosis not present

## 2016-08-10 DIAGNOSIS — Z7984 Long term (current) use of oral hypoglycemic drugs: Secondary | ICD-10-CM | POA: Diagnosis not present

## 2016-08-10 DIAGNOSIS — I130 Hypertensive heart and chronic kidney disease with heart failure and stage 1 through stage 4 chronic kidney disease, or unspecified chronic kidney disease: Secondary | ICD-10-CM | POA: Insufficient documentation

## 2016-08-10 DIAGNOSIS — E1122 Type 2 diabetes mellitus with diabetic chronic kidney disease: Secondary | ICD-10-CM | POA: Diagnosis not present

## 2016-08-10 DIAGNOSIS — R079 Chest pain, unspecified: Secondary | ICD-10-CM | POA: Diagnosis not present

## 2016-08-10 DIAGNOSIS — Z791 Long term (current) use of non-steroidal anti-inflammatories (NSAID): Secondary | ICD-10-CM | POA: Insufficient documentation

## 2016-08-10 DIAGNOSIS — Z79899 Other long term (current) drug therapy: Secondary | ICD-10-CM | POA: Diagnosis not present

## 2016-08-10 DIAGNOSIS — Z951 Presence of aortocoronary bypass graft: Secondary | ICD-10-CM | POA: Diagnosis not present

## 2016-08-10 DIAGNOSIS — R062 Wheezing: Secondary | ICD-10-CM | POA: Diagnosis not present

## 2016-08-10 DIAGNOSIS — I252 Old myocardial infarction: Secondary | ICD-10-CM | POA: Insufficient documentation

## 2016-08-10 DIAGNOSIS — N189 Chronic kidney disease, unspecified: Secondary | ICD-10-CM | POA: Diagnosis not present

## 2016-08-10 DIAGNOSIS — I2510 Atherosclerotic heart disease of native coronary artery without angina pectoris: Secondary | ICD-10-CM | POA: Insufficient documentation

## 2016-08-10 DIAGNOSIS — Z87891 Personal history of nicotine dependence: Secondary | ICD-10-CM | POA: Diagnosis not present

## 2016-08-10 DIAGNOSIS — R0789 Other chest pain: Secondary | ICD-10-CM | POA: Insufficient documentation

## 2016-08-10 LAB — BASIC METABOLIC PANEL
ANION GAP: 8 (ref 5–15)
BUN: 32 mg/dL — AB (ref 6–20)
CHLORIDE: 107 mmol/L (ref 101–111)
CO2: 23 mmol/L (ref 22–32)
Calcium: 8.7 mg/dL — ABNORMAL LOW (ref 8.9–10.3)
Creatinine, Ser: 1.67 mg/dL — ABNORMAL HIGH (ref 0.61–1.24)
GFR calc Af Amer: 50 mL/min — ABNORMAL LOW (ref >60)
GFR, EST NON AFRICAN AMERICAN: 43 mL/min — AB (ref >60)
GLUCOSE: 156 mg/dL — AB (ref 65–99)
POTASSIUM: 4.3 mmol/L (ref 3.5–5.1)
Sodium: 138 mmol/L (ref 135–145)

## 2016-08-10 LAB — I-STAT TROPONIN, ED: Troponin i, poc: 0.01 ng/mL (ref 0.00–0.08)

## 2016-08-10 LAB — CBC
HEMATOCRIT: 40.2 % (ref 39.0–52.0)
HEMOGLOBIN: 13.5 g/dL (ref 13.0–17.0)
MCH: 30.8 pg (ref 26.0–34.0)
MCHC: 33.6 g/dL (ref 30.0–36.0)
MCV: 91.8 fL (ref 78.0–100.0)
Platelets: 260 10*3/uL (ref 150–400)
RBC: 4.38 MIL/uL (ref 4.22–5.81)
RDW: 14 % (ref 11.5–15.5)
WBC: 5.7 10*3/uL (ref 4.0–10.5)

## 2016-08-10 NOTE — ED Triage Notes (Signed)
Pt reports left sided chest and back pain that started 2 days ago worse with deep breath or movement. Pt reports taking an Aleve this evening with no relief.

## 2016-08-10 NOTE — ED Provider Notes (Signed)
[place] DEPT Provider Note   CSN: [id number] Arrival date & time: 08/10/16  2139  By signing my name below, I, Patrick Stewart, attest that this documentation has been prepared under the direction and in the presence of Patrick Balls, MD. Electronically Signed: Reola Stewart, ED Scribe. 08/10/16. 12:22 AM.  History   Chief Complaint Chief Complaint  Patient presents with  . Chest Pain   The history is provided by the patient. No language interpreter was used.   HPI Comments: Patrick Stewart is a 60 y.o. male with a PMHx of Gout, HTN, MI, CKD, CAD s/p CABG x 2, CHF, Cardiomyopathy, and DM, who presents to the Emergency Department complaining of sudden onset, unchanged, constant left upper back and left-sided chest pain x ~2 days. Pt reports associated cough since the onset of his chest/back pain. He states that "it feels like a muscle spasm". He denies any trauma or recent heavy lifting, but notes that he does occasionally do light-workouts. His pain is exacerbated with coughing, bending, and twisting. Pt took Aleve and Tylenol PTA with minimal relief of his pain. Pt had a Cardiac Catheterization ~2 months ago performed at [facility] which was unremarkable for any blockages, per pt. Pt additionally notes that he his pain is not similar to his previous MI. No Hx of PE/DVT, recent long travel, surgery, fracture, prolonged immobilization, or hormone use. Denies breathing problems, fevers, URI-like symptoms, leg swelling, LE pain, or any other associated symptoms.    Past Medical History:  Diagnosis Date  . Chronic bronchitis ([place])   . Diabetes mellitus without complication ([place])   . Gout   . Hypertension   . MI (myocardial infarction) [facility])    Patient Active Problem List   Diagnosis Date Noted  . Chest pain 10/13/2015  . CKD (chronic kidney disease) 10/13/2015  . Gout 10/13/2015  . Type 2 diabetes mellitus with chronic kidney disease ([place]) 10/13/2015  . Accelerated hypertension  10/13/2015  . Abnormal EKG 10/13/2015  . CAD Status post CABG 2 SVG to OM 1, LIMA to mid LAD: 10/13/2015  . Cardiomyopathy, ischemic, chronic systolic CHF [id number]    Past Surgical History:  Procedure Laterality Date  . CORONARY ARTERY BYPASS GRAFT    . KNEE SURGERY    . ROTATOR CUFF REPAIR    . WRIST SURGERY      Home Medications    Prior to Admission medications   Medication Sig Start Date End Date Taking? Authorizing Provider  atorvastatin (LIPITOR) 80 MG tablet Take 80 mg by mouth daily. 03/13/15  Yes Historical Provider, MD  carvedilol (COREG) 6.25 MG tablet Take 6.25 mg by mouth 2 (two) times daily. 10/25/15  Yes Historical Provider, MD  colchicine 0.6 MG tablet Take 1 tablet (0.6 mg total) by mouth 2 (two) times daily. Patient taking differently: Take 0.6 mg by mouth 2 (two) times daily as needed (gout flare).  04/29/16  Yes Dorie Rank, MD  Diclofenac Sodium CR (VOLTAREN-XR) 100 MG 24 hr tablet Take 1 tablet (100 mg total) by mouth daily. Patient taking differently: Take 100 mg by mouth daily as needed for pain.  02/14/16  Yes [name], MD  famotidine (PEPCID) 20 MG tablet Take 1 tablet (20 mg total) by mouth 2 (two) times daily. 04/29/16  Yes Dorie Rank, MD  furosemide (LASIX) 80 MG tablet Take 80 mg by mouth daily. 10/25/15  Yes Historical Provider, MD  isosorbide mononitrate (IMDUR) 30 MG 24 hr tablet Take 30 mg by mouth daily. 10/25/15  Yes Historical Provider, MD  lisinopril (PRINIVIL,ZESTRIL) 40 MG tablet Take 40 mg by mouth daily. 03/27/16  Yes Historical Provider, MD  metFORMIN (GLUCOPHAGE) 1000 MG tablet Take 1,000 mg by mouth 2 (two) times daily. 10/25/15  Yes Historical Provider, MD  methocarbamol (ROBAXIN) 500 MG tablet Take 2 tablets (1,000 mg total) by mouth every 8 (eight) hours as needed for muscle spasms. Patient taking differently: Take 500 mg by mouth every 8 (eight) hours as needed for muscle spasms.  04/29/16  Yes Dorie Rank, MD  naproxen sodium (ANAPROX) 220 MG  tablet Take 220 mg by mouth daily as needed (pain).    Yes Historical Provider, MD  NIFEdipine (PROCARDIA-XL/ADALAT CC) 60 MG 24 hr tablet Take 60 mg by mouth daily. 04/08/16  Yes Historical Provider, MD  oxyCODONE-acetaminophen (PERCOCET/ROXICET) 5-325 MG tablet Take 1 tablet by mouth every 4 (four) hours as needed (for pain). 05/16/16  Yes John Molpus, MD  predniSONE (DELTASONE) 20 MG tablet Take 40 mg by mouth daily for 3 days, then 20mg  by mouth daily for 3 days, then 10mg  daily for 3 days Patient not taking: Reported on 08/10/2016 05/16/16   Shanon Rosser, MD   Family History History reviewed. No pertinent family history.  Social History Social History  Substance Use Topics  . Smoking status: Former Smoker    Packs/day: 0.25    Quit date: 07/10/2016  . Smokeless tobacco: Never Used  . Alcohol use No   Allergies   Hydrocodone   Review of Systems Review of Systems  10 Systems reviewed and all are negative for acute change except as noted in the HPI.  Physical Exam Updated Vital Signs BP 178/98 (BP Location: Left Arm)   Pulse (!) 59   Temp 98.8 F (37.1 C) (Oral)   Resp 18   Ht 5\' 10"  (1.778 m)   Wt 200 lb (90.7 kg)   SpO2 100%   BMI 28.70 kg/m   Physical Exam  Constitutional: He is oriented to person, place, and time. Vital signs are normal. He appears well-developed and well-nourished.  Non-toxic appearance. He does not appear ill. No distress.  HENT:  Head: Normocephalic and atraumatic.  Nose: Nose normal.  Mouth/Throat: Oropharynx is clear and moist. No oropharyngeal exudate.  Eyes: Conjunctivae and EOM are normal. Pupils are equal, round, and reactive to light. No scleral icterus.  Neck: Normal range of motion. Neck supple. No tracheal deviation, no edema, no erythema and normal range of motion present. No thyroid mass and no thyromegaly present.  Cardiovascular: Normal rate, regular rhythm, S1 normal, S2 normal, normal heart sounds, intact distal pulses and normal  pulses.  Exam reveals no gallop and no friction rub.   No murmur heard. Pulmonary/Chest: Effort normal. No respiratory distress. He has wheezes (intermittent) in the left upper field. He has no rhonchi. He has no rales. He exhibits tenderness (Left anterior chest wall).  Abdominal: Soft. Normal appearance and bowel sounds are normal. He exhibits no distension, no ascites and no mass. There is no hepatosplenomegaly. There is no tenderness. There is no rebound, no guarding and no CVA tenderness.  Musculoskeletal: Normal range of motion. He exhibits no edema or tenderness.  Lymphadenopathy:    He has no cervical adenopathy.  Neurological: He is alert and oriented to person, place, and time. He has normal strength. No cranial nerve deficit or sensory deficit.  Skin: Skin is warm, dry and intact. No petechiae and no rash noted. He is not diaphoretic. No erythema. No pallor.  Nursing note and vitals reviewed.  ED Treatments / Results  DIAGNOSTIC STUDIES: Oxygen Saturation is 99% on RA, normal by my interpretation.   COORDINATION OF CARE: 12:11 AM-Discussed next steps with pt. Pt verbalized understanding and is agreeable with the plan.   Labs (all labs ordered are listed, but only abnormal results are displayed) Labs Reviewed  BASIC METABOLIC PANEL - Abnormal; Notable for the following:       Result Value   Glucose, Bld 156 (*)    BUN 32 (*)    Creatinine, Ser 1.67 (*)    Calcium 8.7 (*)    GFR calc non Af Amer 43 (*)    GFR calc Af Amer 50 (*)    All other components within normal limits  CBC  I-STAT TROPOININ, ED   EKG  EKG Interpretation  Date/Time:  Monday August 10 2016 21:44:20 EDT Ventricular Rate:  67 PR Interval:    QRS Duration: 161 QT Interval:  451 QTC Calculation: 477 R Axis:   -17 Text Interpretation:  Sinus rhythm Vent pre-excit'n(WPW), right access'y pathway Baseline wander in lead(s) II aVR aVF Lateral inverted t waves and ST depression 09/2014 Confirmed by  Jeneen Rinks  MD, [place] ([id number]) on 08/10/2016 9:55:09 PM      Radiology Dg Chest 2 View  Result Date: 08/10/2016 CLINICAL DATA:  Acute onset of left lateral chest pain. Initial encounter. EXAM: CHEST  2 VIEW COMPARISON:  Chest radiograph performed 04/29/2016 FINDINGS: The lungs are well-aerated. Pulmonary vascularity is at the upper limits of normal. There is no evidence of focal opacification, pleural effusion or pneumothorax. The heart is mildly enlarged. The patient is status post median sternotomy, with evidence of prior CABG. No acute osseous abnormalities are seen. IMPRESSION: Mild cardiomegaly.  Lungs remain grossly clear. Electronically Signed   By: Garald Balding M.D.   On: 08/10/2016 22:00   Ct Chest Wo Contrast  Result Date: 08/11/2016 CLINICAL DATA:  Left upper back and chest pain with cough for 2 days. Wheezing. Unremarkable cardiac catheterization at [facility] 2 months ago. EXAM: CT CHEST WITHOUT CONTRAST TECHNIQUE: Multidetector CT imaging of the chest was performed following the standard protocol without IV contrast. COMPARISON:  None. FINDINGS: Cardiovascular: Cardiac enlargement. Coronary artery calcifications. Postoperative changes consistent with coronary bypass. Normal caliber thoracic aorta. Mediastinum/Nodes: Small esophageal hiatal hernia. Esophagus is decompressed. Scattered mediastinal lymph nodes are not pathologically enlarged. Lungs/Pleura: No focal airspace disease or consolidation. No pleural effusions. No pneumothorax. Airways appear patent. Upper Abdomen: Included portions of the upper abdomen demonstrate date low-attenuation lesion in the right lobe of the liver probably representing a small cyst or hemangioma. Musculoskeletal: No destructive bone lesions. Postoperative median sternotomy. IMPRESSION: No acute process demonstrated in the chest on noncontrast imaging. No evidence of active pulmonary disease. Cardiac enlargement. Electronically Signed   By: Lucienne Capers M.D.   On:  08/11/2016 01:17   Procedures Procedures (including critical care time)  Medications Ordered in ED Medications  ketorolac (TORADOL) injection 60 mg (60 mg Intramuscular Given 08/11/16 0033)  traMADol (ULTRAM) tablet 50 mg (50 mg Oral Given 08/11/16 0032)    Initial Impression / Assessment and Plan / ED Course  I have reviewed the triage vital signs and the nursing notes.  Pertinent labs & imaging results that were available during my care of the patient were reviewed by me and considered in my medical decision making (see chart for details).  Clinical Course   Patient presents to the ED for L chest pain and L  upper back pain. He denies any trauma to the area.  Physical exam is abnormal with isolated wheezing.  CXR is negative.  Will obtain CT scan for further evaluation.  He was given tramadol and toradol in the ED.  History is not consistent with ACS, there is no exertional component, he denies it feeling like pressure, there is no SOB.  Troponin is negative, ordered by triage and not clinically indicated.  EKG is unchanged.   1:20 AM CT negative for acute pathology.  Likely MSK pain.  Will Dc with tramadol for breakthrough pain. PCP fu advised.  He appears well and in NAD.  Vs remain within his normal limits and he is safe for DC.   Final Clinical Impressions(s) / ED Diagnoses   Final diagnoses:  None    New Prescriptions New Prescriptions   No medications on file     I personally performed the services described in this documentation, which was scribed in my presence. The recorded information has been reviewed and is accurate.      Patrick Balls, MD 08/11/16 [phone]

## 2016-08-11 ENCOUNTER — Emergency Department (HOSPITAL_COMMUNITY): Payer: Medicare Other

## 2016-08-11 DIAGNOSIS — R079 Chest pain, unspecified: Secondary | ICD-10-CM | POA: Diagnosis not present

## 2016-08-11 DIAGNOSIS — R0789 Other chest pain: Secondary | ICD-10-CM | POA: Diagnosis not present

## 2016-08-11 MED ORDER — TRAMADOL HCL 50 MG PO TABS: 50.0000 mg | ORAL_TABLET | Freq: Two times a day (BID) | ORAL | 0 refills | Status: DC | PRN

## 2016-08-11 MED ORDER — KETOROLAC TROMETHAMINE 60 MG/2ML IM SOLN
60.0000 mg | Freq: Once | INTRAMUSCULAR | Status: AC
  Administered 2016-08-11: 60 mg via INTRAMUSCULAR
  Filled 2016-08-11: qty 2

## 2016-08-11 MED ORDER — TRAMADOL HCL 50 MG PO TABS
50.0000 mg | ORAL_TABLET | Freq: Once | ORAL | Status: AC
  Administered 2016-08-11: 50 mg via ORAL
  Filled 2016-08-11: qty 1

## 2016-08-16 DIAGNOSIS — M10042 Idiopathic gout, left hand: Secondary | ICD-10-CM | POA: Diagnosis not present

## 2016-08-16 DIAGNOSIS — M10062 Idiopathic gout, left knee: Secondary | ICD-10-CM | POA: Diagnosis not present

## 2016-08-16 DIAGNOSIS — Z87891 Personal history of nicotine dependence: Secondary | ICD-10-CM | POA: Diagnosis not present

## 2016-08-16 DIAGNOSIS — E119 Type 2 diabetes mellitus without complications: Secondary | ICD-10-CM | POA: Diagnosis not present

## 2016-08-16 DIAGNOSIS — N189 Chronic kidney disease, unspecified: Secondary | ICD-10-CM | POA: Insufficient documentation

## 2016-08-16 DIAGNOSIS — I129 Hypertensive chronic kidney disease with stage 1 through stage 4 chronic kidney disease, or unspecified chronic kidney disease: Secondary | ICD-10-CM | POA: Insufficient documentation

## 2016-08-16 DIAGNOSIS — M79642 Pain in left hand: Secondary | ICD-10-CM | POA: Diagnosis present

## 2016-08-16 DIAGNOSIS — Z7984 Long term (current) use of oral hypoglycemic drugs: Secondary | ICD-10-CM | POA: Insufficient documentation

## 2016-08-16 DIAGNOSIS — Z79899 Other long term (current) drug therapy: Secondary | ICD-10-CM | POA: Insufficient documentation

## 2016-08-16 DIAGNOSIS — M109 Gout, unspecified: Secondary | ICD-10-CM | POA: Diagnosis not present

## 2016-08-17 ENCOUNTER — Encounter (HOSPITAL_BASED_OUTPATIENT_CLINIC_OR_DEPARTMENT_OTHER): Payer: Self-pay | Admitting: Emergency Medicine

## 2016-08-17 ENCOUNTER — Emergency Department (HOSPITAL_BASED_OUTPATIENT_CLINIC_OR_DEPARTMENT_OTHER)
Admission: EM | Admit: 2016-08-17 | Discharge: 2016-08-17 | Disposition: A | Discharge status: Home / Self Care | Payer: Medicare Other | Attending: Emergency Medicine | Admitting: Emergency Medicine

## 2016-08-17 DIAGNOSIS — M10042 Idiopathic gout, left hand: Secondary | ICD-10-CM | POA: Diagnosis not present

## 2016-08-17 DIAGNOSIS — M109 Gout, unspecified: Secondary | ICD-10-CM

## 2016-08-17 MED ORDER — PREDNISONE 50 MG PO TABS: ORAL_TABLET | ORAL | 0 refills | Status: DC

## 2016-08-17 MED ORDER — OXYCODONE-ACETAMINOPHEN 5-325 MG PO TABS
1.0000 | ORAL_TABLET | Freq: Once | ORAL | Status: AC
Start: 2016-08-17 — End: 2016-08-17
  Administered 2016-08-17: 1 via ORAL
  Filled 2016-08-17: qty 1

## 2016-08-17 MED ORDER — PREDNISONE 50 MG PO TABS
60.0000 mg | ORAL_TABLET | Freq: Once | ORAL | Status: AC
  Administered 2016-08-17: 60 mg via ORAL
  Filled 2016-08-17: qty 1

## 2016-08-17 NOTE — ED Triage Notes (Signed)
Pt states his left middle and left knee have been swelling for 3 days, states he thinks it's his gout

## 2016-08-17 NOTE — ED Provider Notes (Signed)
[facility] DEPT [facility] Provider Note   CSN: [id number] Arrival date & time: 08/16/16  2358     History   Chief Complaint Chief Complaint  Patient presents with  . Knee Pain  . Hand Pain    HPI Patrick Stewart is a 60 y.o. male.  The history is provided by the patient.  Hand Pain  This is a new problem. The current episode started more than 2 days ago. The problem occurs daily. The problem has been gradually worsening. Pertinent negatives include no chest pain, no abdominal pain and no headaches. Exacerbated by: palpation. The symptoms are relieved by rest.  pt reports left middle finger pain/swelling and left knee pain for past 3 days He feels this is gout He has long h/o gout with similar presentation No fever/vomiting No trauma No cp/sob No other complaints  Past Medical History:  Diagnosis Date  . Chronic bronchitis ([place])   . Diabetes mellitus without complication ([place])   . Gout   . Hypertension   . MI (myocardial infarction) [facility])     Patient Active Problem List   Diagnosis Date Noted  . Chest pain 10/13/2015  . CKD (chronic kidney disease) 10/13/2015  . Gout 10/13/2015  . Type 2 diabetes mellitus with chronic kidney disease ([place]) 10/13/2015  . Accelerated hypertension 10/13/2015  . Abnormal EKG 10/13/2015  . CAD Status post CABG 2 SVG to OM 1, LIMA to mid LAD: 10/13/2015  . Cardiomyopathy, ischemic, chronic systolic CHF [id number]    Past Surgical History:  Procedure Laterality Date  . CORONARY ARTERY BYPASS GRAFT    . KNEE SURGERY    . ROTATOR CUFF REPAIR    . WRIST SURGERY         Home Medications    Prior to Admission medications   Medication Sig Start Date End Date Taking? Authorizing Provider  atorvastatin (LIPITOR) 80 MG tablet Take 80 mg by mouth daily. 03/13/15   Historical Provider, MD  carvedilol (COREG) 6.25 MG tablet Take 6.25 mg by mouth 2 (two) times daily. 10/25/15   Historical Provider, MD  colchicine 0.6 MG tablet Take 1 tablet  (0.6 mg total) by mouth 2 (two) times daily. Patient taking differently: Take 0.6 mg by mouth 2 (two) times daily as needed (gout flare).  04/29/16   Dorie Rank, MD  Diclofenac Sodium CR (VOLTAREN-XR) 100 MG 24 hr tablet Take 1 tablet (100 mg total) by mouth daily. Patient taking differently: Take 100 mg by mouth daily as needed for pain.  02/14/16   [name], MD  famotidine (PEPCID) 20 MG tablet Take 1 tablet (20 mg total) by mouth 2 (two) times daily. 04/29/16   Dorie Rank, MD  furosemide (LASIX) 80 MG tablet Take 80 mg by mouth daily. 10/25/15   Historical Provider, MD  isosorbide mononitrate (IMDUR) 30 MG 24 hr tablet Take 30 mg by mouth daily. 10/25/15   Historical Provider, MD  lisinopril (PRINIVIL,ZESTRIL) 40 MG tablet Take 40 mg by mouth daily. 03/27/16   Historical Provider, MD  metFORMIN (GLUCOPHAGE) 1000 MG tablet Take 1,000 mg by mouth 2 (two) times daily. 10/25/15   Historical Provider, MD  methocarbamol (ROBAXIN) 500 MG tablet Take 2 tablets (1,000 mg total) by mouth every 8 (eight) hours as needed for muscle spasms. Patient taking differently: Take 500 mg by mouth every 8 (eight) hours as needed for muscle spasms.  04/29/16   Dorie Rank, MD  naproxen sodium (ANAPROX) 220 MG tablet Take 220 mg by mouth daily as needed (  pain).     Historical Provider, MD  NIFEdipine (PROCARDIA-XL/ADALAT CC) 60 MG 24 hr tablet Take 60 mg by mouth daily. 04/08/16   Historical Provider, MD  oxyCODONE-acetaminophen (PERCOCET/ROXICET) 5-325 MG tablet Take 1 tablet by mouth every 4 (four) hours as needed (for pain). 05/16/16   John Molpus, MD  predniSONE (DELTASONE) 50 MG tablet 1 tablet PO QD X4 days 08/17/16   Ripley Fraise, MD  traMADol (ULTRAM) 50 MG tablet Take 1 tablet (50 mg total) by mouth every 12 (twelve) hours as needed for severe pain. 08/11/16   Everlene Balls, MD    Family History History reviewed. No pertinent family history.  Social History Social History  Substance Use Topics  . Smoking status:  Former Smoker    Packs/day: 0.25    Quit date: 07/10/2016  . Smokeless tobacco: Never Used  . Alcohol use No     Allergies   Hydrocodone   Review of Systems Review of Systems  Constitutional: Negative for fever.  Cardiovascular: Negative for chest pain.  Gastrointestinal: Negative for abdominal pain.  Musculoskeletal: Positive for arthralgias and joint swelling.  Neurological: Negative for headaches.     Physical Exam Updated Vital Signs BP 170/100   Pulse 76   Temp 98.2 F (36.8 C) (Oral)   Resp 20   Ht 5\' 10"  (1.778 m)   Wt 90.7 kg   SpO2 97%   BMI 28.70 kg/m   Physical Exam CONSTITUTIONAL: Well developed/well nourished, uncomfortable appearing HEAD: Normocephalic/atraumatic EYES: EOMI ENMT: Mucous membranes moist NECK: supple no meningeal signs CV: S1/S2 noted LUNGS: Lungs are clear to auscultation bilaterally, no apparent distress ABDOMEN: soft, nontender GU:no cva tenderness NEURO: Pt is awake/alert/appropriate, moves all extremitiesx4.  No facial droop.   EXTREMITIES: pulses normal/equal, full ROM, left middle finger= tender to PIP with minimal erythema but edema is noted.  He has mild tenderness to lateral aspect of left knee but no erythema/edema, no calf tenderness and distal pulses intact to left LE SKIN: warm, color normal PSYCH: no abnormalities of mood noted, alert and oriented to situation   ED Treatments / Results  Labs (all labs ordered are listed, but only abnormal results are displayed) Labs Reviewed - No data to display  EKG  EKG Interpretation None       Radiology No results found.  Procedures Procedures (including critical care time)  Medications Ordered in ED Medications  predniSONE (DELTASONE) tablet 60 mg (60 mg Oral Given 08/17/16 0348)  oxyCODONE-acetaminophen (PERCOCET/ROXICET) 5-325 MG per tablet 1 tablet (1 tablet Oral Given 08/17/16 0348)     Initial Impression / Assessment and Plan / ED Course  I have reviewed  the triage vital signs and the nursing notes.    Clinical Course    Pt with long h/o gout with similar presentations He reports prednisone usually works for pain After meds he appears improved No distress noted Advised to monitor glucose at home since he is on prednisone He already has pain med at home  Narcotic database reviewed and considered in decision making   Final Clinical Impressions(s) / ED Diagnoses   Final diagnoses:  Gouty arthritis    New Prescriptions Discharge Medication List as of 08/17/2016  4:14 AM       Ripley Fraise, MD 08/17/16 [phone]

## 2016-08-17 NOTE — ED Notes (Signed)
MD with pt  

## 2016-08-17 NOTE — ED Notes (Signed)
Pt states he has a ride home

## 2016-08-17 NOTE — ED Notes (Signed)
Pt states he has a hx of gout and that this is a flare-up. C/O pain to left hand and knee. Took Allupurinol this morning, "but I think it was too late".

## 2016-08-20 ENCOUNTER — Encounter (HOSPITAL_COMMUNITY): Payer: Self-pay | Admitting: Emergency Medicine

## 2016-08-20 ENCOUNTER — Emergency Department (HOSPITAL_COMMUNITY): Payer: Medicare Other

## 2016-08-20 ENCOUNTER — Emergency Department (HOSPITAL_COMMUNITY)
Admission: EM | Admit: 2016-08-20 | Discharge: 2016-08-20 | Disposition: A | Discharge status: Home / Self Care | Payer: Medicare Other | Attending: Emergency Medicine | Admitting: Emergency Medicine

## 2016-08-20 DIAGNOSIS — Z87891 Personal history of nicotine dependence: Secondary | ICD-10-CM | POA: Diagnosis not present

## 2016-08-20 DIAGNOSIS — Z79891 Long term (current) use of opiate analgesic: Secondary | ICD-10-CM | POA: Insufficient documentation

## 2016-08-20 DIAGNOSIS — Z7984 Long term (current) use of oral hypoglycemic drugs: Secondary | ICD-10-CM | POA: Insufficient documentation

## 2016-08-20 DIAGNOSIS — Z79899 Other long term (current) drug therapy: Secondary | ICD-10-CM | POA: Insufficient documentation

## 2016-08-20 DIAGNOSIS — I110 Hypertensive heart disease with heart failure: Secondary | ICD-10-CM | POA: Diagnosis not present

## 2016-08-20 DIAGNOSIS — N189 Chronic kidney disease, unspecified: Secondary | ICD-10-CM | POA: Diagnosis not present

## 2016-08-20 DIAGNOSIS — I252 Old myocardial infarction: Secondary | ICD-10-CM | POA: Insufficient documentation

## 2016-08-20 DIAGNOSIS — R0602 Shortness of breath: Secondary | ICD-10-CM | POA: Diagnosis present

## 2016-08-20 DIAGNOSIS — I5023 Acute on chronic systolic (congestive) heart failure: Secondary | ICD-10-CM | POA: Diagnosis not present

## 2016-08-20 DIAGNOSIS — I1310 Hypertensive heart and chronic kidney disease without heart failure, with stage 1 through stage 4 chronic kidney disease, or unspecified chronic kidney disease: Secondary | ICD-10-CM | POA: Diagnosis not present

## 2016-08-20 DIAGNOSIS — I2510 Atherosclerotic heart disease of native coronary artery without angina pectoris: Secondary | ICD-10-CM | POA: Insufficient documentation

## 2016-08-20 DIAGNOSIS — E1122 Type 2 diabetes mellitus with diabetic chronic kidney disease: Secondary | ICD-10-CM | POA: Insufficient documentation

## 2016-08-20 LAB — BASIC METABOLIC PANEL
Anion gap: 8 (ref 5–15)
BUN: 33 mg/dL — ABNORMAL HIGH (ref 6–20)
CALCIUM: 8.6 mg/dL — AB (ref 8.9–10.3)
CO2: 22 mmol/L (ref 22–32)
Chloride: 105 mmol/L (ref 101–111)
Creatinine, Ser: 1.4 mg/dL — ABNORMAL HIGH (ref 0.61–1.24)
GFR calc non Af Amer: 53 mL/min — ABNORMAL LOW (ref >60)
GLUCOSE: 367 mg/dL — AB (ref 65–99)
Potassium: 4.8 mmol/L (ref 3.5–5.1)
Sodium: 135 mmol/L (ref 135–145)

## 2016-08-20 LAB — CBC WITH DIFFERENTIAL/PLATELET
BASOS PCT: 0 %
Basophils Absolute: 0 10*3/uL (ref 0.0–0.1)
EOS ABS: 0 10*3/uL (ref 0.0–0.7)
EOS PCT: 0 %
HEMATOCRIT: 35.6 % — AB (ref 39.0–52.0)
Hemoglobin: 11.6 g/dL — ABNORMAL LOW (ref 13.0–17.0)
Lymphocytes Relative: 8 %
Lymphs Abs: 0.9 10*3/uL (ref 0.7–4.0)
MCH: 30.8 pg (ref 26.0–34.0)
MCHC: 32.6 g/dL (ref 30.0–36.0)
MCV: 94.4 fL (ref 78.0–100.0)
MONO ABS: 0.4 10*3/uL (ref 0.1–1.0)
MONOS PCT: 4 %
NEUTROS ABS: 9.4 10*3/uL — AB (ref 1.7–7.7)
Neutrophils Relative %: 88 %
Platelets: 234 10*3/uL (ref 150–400)
RBC: 3.77 MIL/uL — ABNORMAL LOW (ref 4.22–5.81)
RDW: 14.9 % (ref 11.5–15.5)
WBC: 10.7 10*3/uL — ABNORMAL HIGH (ref 4.0–10.5)

## 2016-08-20 LAB — BRAIN NATRIURETIC PEPTIDE: B Natriuretic Peptide: 766.2 pg/mL — ABNORMAL HIGH (ref 0.0–100.0)

## 2016-08-20 MED ORDER — FUROSEMIDE 10 MG/ML IJ SOLN
80.0000 mg | Freq: Once | INTRAMUSCULAR | Status: AC
  Administered 2016-08-20: 80 mg via INTRAVENOUS
  Filled 2016-08-20: qty 8

## 2016-08-20 MED ORDER — ALBUTEROL SULFATE HFA 108 (90 BASE) MCG/ACT IN AERS
2.0000 | INHALATION_SPRAY | RESPIRATORY_TRACT | Status: DC | PRN
  Administered 2016-08-20: 2 via RESPIRATORY_TRACT
  Filled 2016-08-20: qty 6.7

## 2016-08-20 MED ORDER — FUROSEMIDE 40 MG PO TABS: 40.0000 mg | ORAL_TABLET | Freq: Every day | ORAL | 0 refills | Status: DC

## 2016-08-20 MED ORDER — DEXAMETHASONE SODIUM PHOSPHATE 10 MG/ML IJ SOLN
10.0000 mg | Freq: Once | INTRAMUSCULAR | Status: AC
  Administered 2016-08-20: 10 mg via INTRAVENOUS
  Filled 2016-08-20: qty 1

## 2016-08-20 MED ORDER — ALBUTEROL SULFATE (2.5 MG/3ML) 0.083% IN NEBU
5.0000 mg | INHALATION_SOLUTION | Freq: Once | RESPIRATORY_TRACT | Status: AC
  Administered 2016-08-20: 5 mg via RESPIRATORY_TRACT
  Filled 2016-08-20: qty 6

## 2016-08-20 NOTE — ED Provider Notes (Signed)
[facility] DEPT Provider Note   CSN: [id number] Arrival date & time: 08/20/16  0251   By signing my name below, I, Arianna Nassar, attest that this documentation has been prepared under the direction and in the presence of Merryl Hacker, MD.  Electronically Signed: Julien Nordmann, ED Scribe. 08/20/16. 3:42 AM.   History   Chief Complaint Chief Complaint  Patient presents with  . Shortness of Breath    The history is provided by the patient. No language interpreter was used.   HPI Comments: Patrick Stewart is a 60 y.o. male who has a PMhx of chronic bronchitis, DM, gout, HTN, CAD, CABGx2, and MI presents to the Emergency Department complaining of intermittent, gradual worsening, moderate, shortness of breath onset today. Pt was recently diagnosed with bronchitis and states he has been having shortness of breath. Pt was prescribed a steroid and notes he has one dose left. Pt has not used an inhaler or nebulizer to help with his breathing. Pt denies cough, fever, or chest pain.Denies increased leg swelling. Does have a history of CHF.  Past Medical History:  Diagnosis Date  . Chronic bronchitis ([place])   . Diabetes mellitus without complication ([place])   . Gout   . Hypertension   . MI (myocardial infarction) [facility])     Patient Active Problem List   Diagnosis Date Noted  . Chest pain 10/13/2015  . CKD (chronic kidney disease) 10/13/2015  . Gout 10/13/2015  . Type 2 diabetes mellitus with chronic kidney disease ([place]) 10/13/2015  . Accelerated hypertension 10/13/2015  . Abnormal EKG 10/13/2015  . CAD Status post CABG 2 SVG to OM 1, LIMA to mid LAD: 10/13/2015  . Cardiomyopathy, ischemic, chronic systolic CHF [id number]    Past Surgical History:  Procedure Laterality Date  . CORONARY ARTERY BYPASS GRAFT    . KNEE SURGERY    . ROTATOR CUFF REPAIR    . WRIST SURGERY         Home Medications    Prior to Admission medications   Medication Sig Start Date End Date  Taking? Authorizing Provider  atorvastatin (LIPITOR) 80 MG tablet Take 80 mg by mouth daily. 03/13/15  Yes Historical Provider, MD  carvedilol (COREG) 6.25 MG tablet Take 6.25 mg by mouth 2 (two) times daily. 10/25/15  Yes Historical Provider, MD  famotidine (PEPCID) 20 MG tablet Take 1 tablet (20 mg total) by mouth 2 (two) times daily. 04/29/16  Yes Dorie Rank, MD  furosemide (LASIX) 80 MG tablet Take 80 mg by mouth daily. 10/25/15  Yes Historical Provider, MD  isosorbide mononitrate (IMDUR) 30 MG 24 hr tablet Take 30 mg by mouth daily. 10/25/15  Yes Historical Provider, MD  lisinopril (PRINIVIL,ZESTRIL) 40 MG tablet Take 40 mg by mouth daily. 03/27/16  Yes Historical Provider, MD  metFORMIN (GLUCOPHAGE) 1000 MG tablet Take 1,000 mg by mouth 2 (two) times daily. 10/25/15  Yes Historical Provider, MD  methocarbamol (ROBAXIN) 500 MG tablet Take 2 tablets (1,000 mg total) by mouth every 8 (eight) hours as needed for muscle spasms. 04/29/16  Yes Dorie Rank, MD  naproxen sodium (ANAPROX) 220 MG tablet Take 220 mg by mouth daily as needed (pain).    Yes Historical Provider, MD  oxyCODONE-acetaminophen (PERCOCET/ROXICET) 5-325 MG tablet Take 1 tablet by mouth every 4 (four) hours as needed (for pain). 05/16/16  Yes John Molpus, MD  colchicine 0.6 MG tablet Take 1 tablet (0.6 mg total) by mouth 2 (two) times daily. Patient not taking: Reported on 08/20/2016 04/29/16  Dorie Rank, MD  Diclofenac Sodium CR (VOLTAREN-XR) 100 MG 24 hr tablet Take 1 tablet (100 mg total) by mouth daily. Patient not taking: Reported on 08/20/2016 02/14/16   [name], MD  furosemide (LASIX) 40 MG tablet Take 1 tablet (40 mg total) by mouth daily. 08/20/16   Merryl Hacker, MD  NIFEdipine (PROCARDIA-XL/ADALAT CC) 60 MG 24 hr tablet Take 60 mg by mouth daily. 04/08/16   Historical Provider, MD  predniSONE (DELTASONE) 50 MG tablet 1 tablet PO QD X4 days Patient not taking: Reported on 08/20/2016 08/17/16   Ripley Fraise, MD  traMADol  (ULTRAM) 50 MG tablet Take 1 tablet (50 mg total) by mouth every 12 (twelve) hours as needed for severe pain. Patient not taking: Reported on 08/20/2016 08/11/16   Everlene Balls, MD    Family History Family History  Problem Relation Age of Onset  . Family history unknown: Yes    Social History Social History  Substance Use Topics  . Smoking status: Former Smoker    Packs/day: 0.25    Quit date: 07/10/2016  . Smokeless tobacco: Never Used  . Alcohol use No     Allergies   Hydrocodone   Review of Systems Review of Systems  Constitutional: Negative for fever.  Respiratory: Positive for shortness of breath. Negative for cough.   Cardiovascular: Negative for chest pain and leg swelling.  Gastrointestinal: Negative for abdominal pain and vomiting.  All other systems reviewed and are negative.    Physical Exam Updated Vital Signs BP (!) 191/102 (BP Location: Left Arm)   Pulse 68   Temp 97.6 F (36.4 C) (Oral)   Resp 15   SpO2 99%   Physical Exam  Constitutional: He is oriented to person, place, and time. He appears well-developed and well-nourished. No distress.  HENT:  Head: Normocephalic and atraumatic.  Cardiovascular: Normal rate, regular rhythm and normal heart sounds.   No murmur heard. Pulmonary/Chest: Effort normal. No respiratory distress. He has wheezes.  Diffuse expiratory wheezing with fair air movement  Abdominal: Soft. Bowel sounds are normal. There is no tenderness. There is no rebound.  Musculoskeletal: He exhibits edema.  1+ lower extremity edema  Neurological: He is alert and oriented to person, place, and time.  Skin: Skin is warm and dry.  Psychiatric: He has a normal mood and affect.  Nursing note and vitals reviewed.    ED Treatments / Results  DIAGNOSTIC STUDIES: Oxygen Saturation is 93% on RA, low by my interpretation.  COORDINATION OF CARE:  3:42 AM Discussed treatment plan with pt at bedside and pt agreed to plan.  Labs (all labs  ordered are listed, but only abnormal results are displayed) Labs Reviewed  CBC WITH DIFFERENTIAL/PLATELET - Abnormal; Notable for the following:       Result Value   WBC 10.7 (*)    RBC 3.77 (*)    Hemoglobin 11.6 (*)    HCT 35.6 (*)    Neutro Abs 9.4 (*)    All other components within normal limits  BASIC METABOLIC PANEL - Abnormal; Notable for the following:    Glucose, Bld 367 (*)    BUN 33 (*)    Creatinine, Ser 1.40 (*)    Calcium 8.6 (*)    GFR calc non Af Amer 53 (*)    All other components within normal limits  BRAIN NATRIURETIC PEPTIDE - Abnormal; Notable for the following:    B Natriuretic Peptide 766.2 (*)    All other components within normal limits  EKG  EKG Interpretation  Date/Time:  Thursday August 20 2016 03:11:43 EDT Ventricular Rate:  68 PR Interval:    QRS Duration: 158 QT Interval:  471 QTC Calculation: 501 R Axis:   -43 Text Interpretation:  Sinus rhythm Probable left atrial enlargement Left bundle branch block T wave inversions  improved laterally Reconfirmed by HORTON  MD, COURTNEY ([id number]) on 08/20/2016 3:32:21 AM       Radiology Dg Chest 2 View  Result Date: 08/20/2016 CLINICAL DATA:  60 y/o  M; bronchitis and shortness of breath. EXAM: CHEST  2 VIEW COMPARISON:  Chest CT 08/11/2016.  Chest radiograph 08/10/2016. FINDINGS: Cardiac silhouette is mildly increased. Sternotomy wires are aligned. No acute osseous abnormality is evident. No consolidation, pneumothorax, or pleural effusion. Increased pulmonary vasculature and septal thickening probably represents developing congestion and interstitial edema. IMPRESSION: Developing pulmonary vascular congestion and interstitial edema. Slight increase in size of cardiac silhouette. Electronically Signed   By: Kristine Garbe M.D.   On: 08/20/2016 03:41    Procedures Procedures (including critical care time)  Medications Ordered in ED Medications  albuterol (PROVENTIL HFA;VENTOLIN HFA) 108 (90  Base) MCG/ACT inhaler 2 puff (2 puffs Inhalation Given 08/20/16 0410)  albuterol (PROVENTIL) (2.5 MG/3ML) 0.083% nebulizer solution 5 mg (5 mg Nebulization Given 08/20/16 0334)  dexamethasone (DECADRON) injection 10 mg (10 mg Intravenous Given 08/20/16 0410)  furosemide (LASIX) injection 80 mg (80 mg Intravenous Given 08/20/16 0505)     Initial Impression / Assessment and Plan / ED Course  I have reviewed the triage vital signs and the nursing notes.  Pertinent labs & imaging results that were available during my care of the patient were reviewed by me and considered in my medical decision making (see chart for details).  Clinical Course    Patient presents with worsening shortness of breath. Is currently on steroids for gout. States that he is not getting any better. Does not have an inhaler at home. He also has a history of CHF. Initially he was given a DuoNeb and Decadron. However, x-ray shows vascular congestion and cardiomegaly. Last EF 30-35%. He takes Lasix at home. Lab work obtained including BNP. This was elevated. Patient was given 80 mg of IV Lasix. He was able to ambulate without difficulty and maintain his oxygen saturation. He has 1+ lower extremity edema but otherwise it does not appear overtly volume overloaded. Suspect wheezing and shortness of breath is likely secondary to mild CHF exacerbation. He was also hypertensive. He reports that he takes his blood pressure medications in the morning. Denies chest pain. EKG unchanged from prior. Patient reports that he is trying to get in at [facility]. Patient was given instructed to take 40 mg PO Lasix Friday and Saturday.  Follow-up if not improving.  After history, exam, and medical workup I feel the patient has been appropriately medically screened and is safe for discharge home. Pertinent diagnoses were discussed with the patient. Patient was given return precautions.   Final Clinical Impressions(s) / ED Diagnoses   Final  diagnoses:  Acute on chronic systolic congestive heart failure (HCC)    New Prescriptions New Prescriptions   FUROSEMIDE (LASIX) 40 MG TABLET    Take 1 tablet (40 mg total) by mouth daily.   I personally performed the services described in this documentation, which was scribed in my presence. The recorded information has been reviewed and is accurate.    Merryl Hacker, MD 08/20/16 [phone]

## 2016-08-20 NOTE — Discharge Instructions (Signed)
You were seen today for SOB which is likely related to your heart failure.  You should take an extra 40 mg dose of Lasix tomorrow (Friday) and Saturday.  Follow-up with your PCP for recheck.

## 2016-08-20 NOTE — ED Triage Notes (Signed)
Pt states he has bronchitis and has been feeling short of breath for the past 5 days  Pt has wheezing noted   Pt is c/o a tightness in his chest  Pt has hx of MI

## 2016-08-22 ENCOUNTER — Encounter (HOSPITAL_BASED_OUTPATIENT_CLINIC_OR_DEPARTMENT_OTHER): Payer: Self-pay | Admitting: Emergency Medicine

## 2016-08-22 ENCOUNTER — Emergency Department (HOSPITAL_BASED_OUTPATIENT_CLINIC_OR_DEPARTMENT_OTHER)
Admission: EM | Admit: 2016-08-22 | Discharge: 2016-08-23 | Disposition: A | Discharge status: Home / Self Care | Payer: Medicare Other | Attending: Emergency Medicine | Admitting: Emergency Medicine

## 2016-08-22 DIAGNOSIS — E1122 Type 2 diabetes mellitus with diabetic chronic kidney disease: Secondary | ICD-10-CM | POA: Diagnosis not present

## 2016-08-22 DIAGNOSIS — Z87891 Personal history of nicotine dependence: Secondary | ICD-10-CM | POA: Insufficient documentation

## 2016-08-22 DIAGNOSIS — Z7984 Long term (current) use of oral hypoglycemic drugs: Secondary | ICD-10-CM | POA: Diagnosis not present

## 2016-08-22 DIAGNOSIS — I252 Old myocardial infarction: Secondary | ICD-10-CM | POA: Insufficient documentation

## 2016-08-22 DIAGNOSIS — N189 Chronic kidney disease, unspecified: Secondary | ICD-10-CM | POA: Diagnosis not present

## 2016-08-22 DIAGNOSIS — I129 Hypertensive chronic kidney disease with stage 1 through stage 4 chronic kidney disease, or unspecified chronic kidney disease: Secondary | ICD-10-CM | POA: Diagnosis not present

## 2016-08-22 DIAGNOSIS — I2510 Atherosclerotic heart disease of native coronary artery without angina pectoris: Secondary | ICD-10-CM | POA: Diagnosis not present

## 2016-08-22 DIAGNOSIS — M109 Gout, unspecified: Secondary | ICD-10-CM

## 2016-08-22 DIAGNOSIS — Z79899 Other long term (current) drug therapy: Secondary | ICD-10-CM | POA: Diagnosis not present

## 2016-08-22 DIAGNOSIS — M25562 Pain in left knee: Secondary | ICD-10-CM | POA: Diagnosis present

## 2016-08-22 NOTE — ED Triage Notes (Signed)
Patient reports that he was seen about a week ago for pain to his left hand and knee. Reports that it is not any better at this time

## 2016-08-22 NOTE — ED Provider Notes (Signed)
[facility] Provider Note   CSN: [id number] Arrival date & time: 08/22/16  2307  By signing my name below, I, Hansel Feinstein, attest that this documentation has been prepared under the direction and in the presence of  [employer], PA-C. Electronically Signed: Hansel Feinstein, ED Scribe. 08/23/16. 12:05 AM.    History   Chief Complaint Chief Complaint  Patient presents with  . Knee Pain  . Finger Injury    HPI Patrick Stewart is a 60 y.o. male with h/o gout who presents to the Emergency Department complaining of moderate left middle finger pain and left knee pain onset this week. Pt was last seen in the ED for similar complaints on 08/17/16 and was discharged with a prescription for 4 tablets 50 mg Prednisone. He states he has also tried [employer] with no relief of symptoms. Pt states his pain is worsened with movement. He states his pain is similar to prior gout flare ups. He denies additional symptoms.   The history is provided by the patient. No language interpreter was used.    Past Medical History:  Diagnosis Date  . Chronic bronchitis ([place])   . Diabetes mellitus without complication ([place])   . Gout   . Hypertension   . MI (myocardial infarction) [facility])     Patient Active Problem List   Diagnosis Date Noted  . Chest pain 10/13/2015  . CKD (chronic kidney disease) 10/13/2015  . Gout 10/13/2015  . Type 2 diabetes mellitus with chronic kidney disease ([place]) 10/13/2015  . Accelerated hypertension 10/13/2015  . Abnormal EKG 10/13/2015  . CAD Status post CABG 2 SVG to OM 1, LIMA to mid LAD: 10/13/2015  . Cardiomyopathy, ischemic, chronic systolic CHF [id number]    Past Surgical History:  Procedure Laterality Date  . CORONARY ARTERY BYPASS GRAFT    . KNEE SURGERY    . ROTATOR CUFF REPAIR    . WRIST SURGERY         Home Medications    Prior to Admission medications   Medication Sig Start Date End Date Taking? Authorizing Provider  atorvastatin  (LIPITOR) 80 MG tablet Take 80 mg by mouth daily. 03/13/15   Historical Provider, MD  carvedilol (COREG) 6.25 MG tablet Take 6.25 mg by mouth 2 (two) times daily. 10/25/15   Historical Provider, MD  colchicine 0.6 MG tablet Take 1 tablet (0.6 mg total) by mouth 2 (two) times daily. Patient not taking: Reported on 08/20/2016 04/29/16   Dorie Rank, MD  Diclofenac Sodium CR (VOLTAREN-XR) 100 MG 24 hr tablet Take 1 tablet (100 mg total) by mouth daily. Patient not taking: Reported on 08/20/2016 02/14/16   [name], MD  famotidine (PEPCID) 20 MG tablet Take 1 tablet (20 mg total) by mouth 2 (two) times daily. 04/29/16   Dorie Rank, MD  furosemide (LASIX) 40 MG tablet Take 1 tablet (40 mg total) by mouth daily. 08/20/16   Merryl Hacker, MD  furosemide (LASIX) 80 MG tablet Take 80 mg by mouth daily. 10/25/15   Historical Provider, MD  isosorbide mononitrate (IMDUR) 30 MG 24 hr tablet Take 30 mg by mouth daily. 10/25/15   Historical Provider, MD  lisinopril (PRINIVIL,ZESTRIL) 40 MG tablet Take 40 mg by mouth daily. 03/27/16   Historical Provider, MD  metFORMIN (GLUCOPHAGE) 1000 MG tablet Take 1,000 mg by mouth 2 (two) times daily. 10/25/15   Historical Provider, MD  methocarbamol (ROBAXIN) 500 MG tablet Take 2 tablets (1,000 mg total) by mouth every 8 (eight) hours  as needed for muscle spasms. 04/29/16   Dorie Rank, MD  naproxen sodium (ANAPROX) 220 MG tablet Take 220 mg by mouth daily as needed (pain).     Historical Provider, MD  NIFEdipine (PROCARDIA-XL/ADALAT CC) 60 MG 24 hr tablet Take 60 mg by mouth daily. 04/08/16   Historical Provider, MD  oxyCODONE-acetaminophen (PERCOCET/ROXICET) 5-325 MG tablet Take 1 tablet by mouth every 4 (four) hours as needed (for pain). 05/16/16   John Molpus, MD  predniSONE (DELTASONE) 50 MG tablet 1 tablet PO QD X4 days Patient not taking: Reported on 08/20/2016 08/17/16   Ripley Fraise, MD  traMADol (ULTRAM) 50 MG tablet Take 1 tablet (50 mg total) by mouth every 12 (twelve)  hours as needed for severe pain. Patient not taking: Reported on 08/20/2016 08/11/16   Everlene Balls, MD    Family History Family History  Problem Relation Age of Onset  . Family history unknown: Yes    Social History Social History  Substance Use Topics  . Smoking status: Former Smoker    Packs/day: 0.25    Quit date: 07/10/2016  . Smokeless tobacco: Never Used  . Alcohol use No     Allergies   Hydrocodone   Review of Systems Review of Systems A complete 10 system review of systems was obtained and all systems are negative except as noted in the HPI and PMH.    Physical Exam Updated Vital Signs BP (!) 185/102 (BP Location: Right Arm)   Pulse 72   Temp 98.5 F (36.9 C) (Oral)   Resp 18   Ht 5\' 10"  (1.778 m)   Wt 200 lb (90.7 kg)   SpO2 100%   BMI 28.70 kg/m   Physical Exam  Constitutional: He appears well-developed and well-nourished.  HENT:  Head: Normocephalic.  Eyes: Conjunctivae are normal.  Cardiovascular: Normal rate.   Pulmonary/Chest: Effort normal. No respiratory distress.  Abdominal: He exhibits no distension.  Musculoskeletal: Normal range of motion. He exhibits tenderness.  Left middle finger swollen, warm and tender on exam. Left knee exhibited lateral tenderness to palpation. No appreciable swelling or tenderness on exam.   Neurological: He is alert.  Skin: Skin is warm and dry.  Psychiatric: He has a normal mood and affect. His behavior is normal.  Nursing note and vitals reviewed.   ED Treatments / Results  Labs (all labs ordered are listed, but only abnormal results are displayed) Labs Reviewed - No data to display  EKG  EKG Interpretation None       Radiology No results found.  Procedures Procedures (including critical care time)  DIAGNOSTIC STUDIES: Oxygen Saturation is 100% on RA, normal by my interpretation.    COORDINATION OF CARE: 12:00 AM Discussed treatment plan with pt at bedside which includes pain management and pt  agreed to plan.    Medications Ordered in ED Medications - No data to display   Initial Impression / Assessment and Plan / ED Course  I have reviewed the triage vital signs and the nursing notes.  Pertinent labs & imaging results that were available during my care of the patient were reviewed by me and considered in my medical decision making (see chart for details).  Clinical Course    Patient be treated for gout flare.  Told to return here as needed.  Patient agrees the plan and all questions were answered.  I advised follow-up with his primary care doctor  Final Clinical Impressions(s) / ED Diagnoses   Final diagnoses:  None  New Prescriptions New Prescriptions   No medications on file    I personally performed the services described in this documentation, which was scribed in my presence. The recorded information has been reviewed and is accurate.    Dalia Heading, PA-C 08/23/16 [id number]    Jola Schmidt, MD 08/23/16 0130

## 2016-08-23 MED ORDER — TRAMADOL HCL 50 MG PO TABS: 50.0000 mg | ORAL_TABLET | Freq: Four times a day (QID) | ORAL | 0 refills | Status: DC | PRN

## 2016-08-23 MED ORDER — KETOROLAC TROMETHAMINE 30 MG/ML IJ SOLN: 60.0000 mg | Freq: Once | INTRAMUSCULAR | Status: DC

## 2016-08-23 MED ORDER — HYDROCODONE-ACETAMINOPHEN 5-325 MG PO TABS: 1.0000 | ORAL_TABLET | Freq: Four times a day (QID) | ORAL | 0 refills | Status: DC | PRN

## 2016-08-23 MED ORDER — PREDNISONE 50 MG PO TABS: 50.0000 mg | ORAL_TABLET | Freq: Every day | ORAL | 0 refills | Status: DC

## 2016-08-23 NOTE — Discharge Instructions (Signed)
Follow-up with your primary care doctor.  Return here as needed °

## 2016-08-25 ENCOUNTER — Telehealth: Payer: Self-pay | Admitting: *Deleted

## 2016-08-25 ENCOUNTER — Encounter: Payer: Self-pay | Admitting: *Deleted

## 2016-08-25 NOTE — Telephone Encounter (Signed)
Pre-Visit Call completed with patient and chart updated.   Pre-Visit Info documented in Specialty Comments under [employer].    Dr. Nani Ravens, [facility]. Pt coming in for new patient appt 08/26/16 at 9:30.   To Discuss with Provider: 1) Needs new glucometer and supplies 2) Needs new nebulizer and rx for neb treatments if appropriate 3) Pt new to area. Needs to establish w/ new cardiologist. Would like to see Dr. Stanford Breed upstairs.

## 2016-08-26 ENCOUNTER — Telehealth: Payer: Self-pay | Admitting: Family Medicine

## 2016-08-26 ENCOUNTER — Encounter: Payer: Self-pay | Admitting: Family Medicine

## 2016-08-26 ENCOUNTER — Ambulatory Visit (INDEPENDENT_AMBULATORY_CARE_PROVIDER_SITE_OTHER): Payer: Medicare Other | Admitting: Family Medicine

## 2016-08-26 VITALS — BP 162/98 | HR 68 | Temp 97.8°F | Wt 205.2 lb

## 2016-08-26 DIAGNOSIS — E1122 Type 2 diabetes mellitus with diabetic chronic kidney disease: Secondary | ICD-10-CM | POA: Diagnosis not present

## 2016-08-26 DIAGNOSIS — I10 Essential (primary) hypertension: Secondary | ICD-10-CM

## 2016-08-26 DIAGNOSIS — I25119 Atherosclerotic heart disease of native coronary artery with unspecified angina pectoris: Secondary | ICD-10-CM | POA: Diagnosis not present

## 2016-08-26 DIAGNOSIS — J4489 Other specified chronic obstructive pulmonary disease: Secondary | ICD-10-CM

## 2016-08-26 DIAGNOSIS — R011 Cardiac murmur, unspecified: Secondary | ICD-10-CM

## 2016-08-26 DIAGNOSIS — M109 Gout, unspecified: Secondary | ICD-10-CM | POA: Diagnosis not present

## 2016-08-26 DIAGNOSIS — J449 Chronic obstructive pulmonary disease, unspecified: Secondary | ICD-10-CM | POA: Diagnosis not present

## 2016-08-26 HISTORY — DX: Chronic obstructive pulmonary disease, unspecified: J44.9

## 2016-08-26 HISTORY — DX: Other specified chronic obstructive pulmonary disease: J44.89

## 2016-08-26 LAB — COMPLETE METABOLIC PANEL WITH GFR
ALBUMIN: 3.7 g/dL (ref 3.6–5.1)
ALK PHOS: 45 U/L (ref 40–115)
ALT: 11 U/L (ref 9–46)
AST: 13 U/L (ref 10–35)
BILIRUBIN TOTAL: 0.5 mg/dL (ref 0.2–1.2)
BUN: 34 mg/dL — AB (ref 7–25)
CALCIUM: 8.5 mg/dL — AB (ref 8.6–10.3)
CO2: 26 mmol/L (ref 20–31)
Chloride: 96 mmol/L — ABNORMAL LOW (ref 98–110)
Creat: 1.65 mg/dL — ABNORMAL HIGH (ref 0.70–1.25)
GFR, EST NON AFRICAN AMERICAN: 44 mL/min — AB (ref >=60)
GFR, Est African American: 51 mL/min — ABNORMAL LOW (ref >=60)
GLUCOSE: 233 mg/dL — AB (ref 65–99)
POTASSIUM: 3.9 mmol/L (ref 3.5–5.3)
SODIUM: 137 mmol/L (ref 135–146)
TOTAL PROTEIN: 6.9 g/dL (ref 6.1–8.1)

## 2016-08-26 LAB — LIPID PANEL
CHOLESTEROL: 213 mg/dL — AB (ref 0–200)
HDL: 39.7 mg/dL (ref >39.00)
NonHDL: 173.1
Total CHOL/HDL Ratio: 5
Triglycerides: 236 mg/dL — ABNORMAL HIGH (ref 0.0–149.0)
VLDL: 47.2 mg/dL — ABNORMAL HIGH (ref 0.0–40.0)

## 2016-08-26 LAB — MICROALBUMIN / CREATININE URINE RATIO
CREATININE, U: 155 mg/dL
MICROALB UR: 17.3 mg/dL — AB (ref 0.0–1.9)
Microalb Creat Ratio: 11.2 mg/g (ref 0.0–30.0)

## 2016-08-26 LAB — LDL CHOLESTEROL, DIRECT: LDL DIRECT: 138 mg/dL

## 2016-08-26 LAB — URIC ACID: Uric Acid, Serum: 12.8 mg/dL — ABNORMAL HIGH (ref 4.0–7.8)

## 2016-08-26 LAB — HEMOGLOBIN A1C: Hgb A1c MFr Bld: 9.3 % — ABNORMAL HIGH (ref 4.6–6.5)

## 2016-08-26 MED ORDER — NIFEDIPINE ER 60 MG PO TB24: 60.0000 mg | ORAL_TABLET | Freq: Every day | ORAL | 1 refills | Status: DC

## 2016-08-26 MED ORDER — FREESTYLE LANCETS MISC: 3 refills | Status: AC

## 2016-08-26 MED ORDER — AEROECLIPSE II NEBULIZER MISC: 0 refills | Status: DC

## 2016-08-26 MED ORDER — ALBUTEROL SULFATE (2.5 MG/3ML) 0.083% IN NEBU: 2.5000 mg | INHALATION_SOLUTION | Freq: Four times a day (QID) | RESPIRATORY_TRACT | 3 refills | Status: DC | PRN

## 2016-08-26 MED ORDER — GLUCOSE BLOOD VI STRP: ORAL_STRIP | 3 refills | Status: AC

## 2016-08-26 MED ORDER — ATORVASTATIN CALCIUM 80 MG PO TABS: 80.0000 mg | ORAL_TABLET | Freq: Every day | ORAL | 11 refills | Status: DC

## 2016-08-26 NOTE — Patient Instructions (Addendum)

## 2016-08-26 NOTE — Telephone Encounter (Signed)
Caller name: Relationship to patient: Pharmacy Can be reached: Pharmacy:  [id number] - [place], [place] [phone] (Phone) [phone] (Fax)     Reason for call: Need Rx for Glucose Meter

## 2016-08-26 NOTE — Progress Notes (Signed)
Pre visit review using our clinic review tool, if applicable. No additional management support is needed unless otherwise documented below in the visit note. 

## 2016-08-26 NOTE — Progress Notes (Signed)
Chief Complaint  Patient presents with  . Establish Care    Isosorbide  medication makes head hurt, wants to go back on Nifedipine       New Patient Visit SUBJECTIVE: HPI: Patrick Stewart is an 60 y.o.male who is being seen for establishing care.  The patient was previously seen at a doctor's office. Just moved over from [place].  Health maintenance history: PSA: Has had, unsure how long Colonoscopy: Never had Routine blood work: About 6 mo HIV/Hep C screening: Refused Tetanus: 8 years ago Shingles shot: Has never had Pneumonia shot: 2016  DM  Is a history of diabetes. He is on metformin. He is compliant with his medications. He does have CAD. He needs a new glucometer, lancets, and test strips.  Angina/CAD Pt was placed on Imdur but stopped taking it due to AE of HA. He does not currently follow with any cardiologist despite having a history of MI with coronary artery bypass graft. He also has a family history of heart disease.  Allergies  Allergen Reactions  . Hydrocodone Itching  . Tramadol Nausea Only    Past Medical History:  Diagnosis Date  . Chronic bronchitis ([place])   . COPD with chronic bronchitis ([place]) 08/26/2016  . Diabetes mellitus without complication ([place])   . Gout   . Hypertension   . MI (myocardial infarction) [facility])    Past Surgical History:  Procedure Laterality Date  . CORONARY ARTERY BYPASS GRAFT    . KNEE SURGERY    . ROTATOR CUFF REPAIR    . WRIST SURGERY     Social History   Social History  . Marital status: Married   Social History Main Topics  . Smoking status: Former Smoker    Packs/day: 0.25    Quit date: 07/10/2016  . Smokeless tobacco: Never Used  . Alcohol use No  . Drug use: No  . Sexual activity: Yes   Family History  Problem Relation Age of Onset  . Hypertension Mother   . Diabetes Mother   . Hyperlipidemia Mother   . Kidney failure Mother   . Emphysema Father      Current Outpatient Prescriptions:  .  albuterol  (PROVENTIL) (2.5 MG/3ML) 0.083% nebulizer solution, Take 3 mLs (2.5 mg total) by nebulization every 6 (six) hours as needed for wheezing or shortness of breath., Disp: 75 mL, Rfl: 3 .  atorvastatin (LIPITOR) 80 MG tablet, Take 1 tablet (80 mg total) by mouth daily., Disp: 30 tablet, Rfl: 11 .  carvedilol (COREG) 6.25 MG tablet, Take 6.25 mg by mouth 2 (two) times daily., Disp: , Rfl:  .  Diclofenac Sodium CR (VOLTAREN-XR) 100 MG 24 hr tablet, Take 1 tablet (100 mg total) by mouth daily. (Patient taking differently: Take 100 mg by mouth daily as needed. ), Disp: 10 tablet, Rfl: 0 .  famotidine (PEPCID) 20 MG tablet, Take 1 tablet (20 mg total) by mouth 2 (two) times daily., Disp: 30 tablet, Rfl: 0 .  furosemide (LASIX) 80 MG tablet, Take 60 mg by mouth daily. , Disp: , Rfl:  .  glucose blood test strip, Use as instructed, Disp: 100 each, Rfl: 3 .  isosorbide mononitrate (IMDUR) 30 MG 24 hr tablet, Take 30 mg by mouth daily., Disp: , Rfl:  .  Lancets (FREESTYLE) lancets, Use to check sugars daily, Disp: 100 each, Rfl: 3 .  lisinopril (PRINIVIL,ZESTRIL) 40 MG tablet, Take 40 mg by mouth daily., Disp: , Rfl: 11 .  metFORMIN (GLUCOPHAGE) 1000 MG tablet, Take  1,000 mg by mouth 2 (two) times daily., Disp: , Rfl:  .  methocarbamol (ROBAXIN) 500 MG tablet, Take 2 tablets (1,000 mg total) by mouth every 8 (eight) hours as needed for muscle spasms., Disp: 30 tablet, Rfl: 0 .  naproxen sodium (ANAPROX) 220 MG tablet, Take 220 mg by mouth daily as needed (pain). , Disp: , Rfl:  .  Nebulizers (AEROECLIPSE II NEBULIZER) MISC, Use to administer albuterol every 6 hours as needed., Disp: 1 each, Rfl: 0 .  NIFEdipine (PROCARDIA-XL/ADALAT CC) 60 MG 24 hr tablet, Take 1 tablet (60 mg total) by mouth daily., Disp: 30 tablet, Rfl: 1 .  oxyCODONE-acetaminophen (PERCOCET/ROXICET) 5-325 MG tablet, Take 1 tablet by mouth every 4 (four) hours as needed (for pain)., Disp: 15 tablet, Rfl: 0 .  predniSONE (DELTASONE) 50 MG tablet,  Take 1 tablet (50 mg total) by mouth daily., Disp: 7 tablet, Rfl: 0 .  traMADol (ULTRAM) 50 MG tablet, Take 1 tablet (50 mg total) by mouth every 6 (six) hours as needed for severe pain. (Patient not taking: Reported on 08/25/2016), Disp: 15 tablet, Rfl: 0  ROS Consitutional: Denies fevers, chills  Cardiovascular: Denies chest pain or pressure, palpitations  Respiratory: Denies dyspnea, cough   OBJECTIVE: BP (!) 162/98 (BP Location: Left Arm, Patient Position: Sitting, Cuff Size: Normal)   Pulse 68   Temp 97.8 F (36.6 C) (Oral)   Wt 205 lb 3.2 oz (93.1 kg)   SpO2 97%   BMI 29.44 kg/m   Constitutional: -  VS reviewed -  Well developed, well nourished, appears stated age -  No apparent distress  Psychiatric: -  Oriented to person, place, and time -  Memory intact -  Affect and mood normal -  Fluent conversation, good eye contact -  Judgment and insight age appropriate  Eye: -  Conjunctivae clear, no discharge -  Pupils symmetric, round, reactive to light  ENMT: -  Ears are patent b/l without erythema or discharge. TM's are shiny and clear b/l without evidence of effusion or infection. -  Oral mucosa without lesions, tongue and uvula midline    Tonsils not enlarged, no erythema, no exudate, trachea midline    Pharynx moist, no lesions, no erythema  Neck: -  No gross swelling, no palpable masses -  Thyroid midline, not enlarged, mobile, no palpable masses  Cardiovascular: -  RRR, 2/6 murmur heart loudest at aortic listening post -  No LE edema  Respiratory: -  Normal respiratory effort, no accessory muscle use, no retraction -  Breath sounds equal, no wheezes, no ronchi, no crackles  Neurological:  -  CN II - XII grossly intact -  Sensation grossly intact to light touch, equal bilaterally  Skin: -  No significant lesion on inspection -  Warm and dry to palpation   ASSESSMENT/PLAN: Type 2 diabetes mellitus with chronic kidney disease, without long-term current use of insulin,  unspecified CKD stage ([place]) - Plan: For home use only DME Glucometer, glucose blood test strip, Lancets (FREESTYLE) lancets, Hemoglobin A1c, COMPLETE METABOLIC PANEL WITH GFR, Lipid panel, Microalbumin / creatinine urine ratio  Coronary artery disease involving native coronary artery of native heart with angina pectoris ([place]) - Plan: Ambulatory referral to Cardiology, atorvastatin (LIPITOR) 80 MG tablet  COPD with chronic bronchitis ([place]) - Plan: Nebulizers (AEROECLIPSE II NEBULIZER) MISC, albuterol (PROVENTIL) (2.5 MG/3ML) 0.083% nebulizer solution  Gout of right knee, unspecified cause, unspecified chronicity - Plan: Uric acid  Heart murmur  Essential hypertension - Plan: NIFEdipine (PROCARDIA-XL/ADALAT  CC) 60 MG 24 hr tablet  Patient instructed to sign release of records form for her previous PCP.  Will check uric acid level, will likely need to go and a maintenance medication. His murmur sounds diastolic. I await his previous records as he is likely had cardiac imaging. Will refer to cardiology nonetheless given his history of CAD s/p CABG and heart failure.  Blood pressure is elevated today, will reorder his nifedipine and recheck at his next visit.  Patient should return in 1-2 weeks for a dedicated DM visit. The patient voiced understanding and agreement to the plan.   [place]

## 2016-08-26 NOTE — Telephone Encounter (Signed)
Called and spoke with the pharmacy and they wanted to know if the pt has an glucose meter.   Informed then that the pt was given a glucose meter at his visit today.  The pharmacy said they will run the prescription to see if the pt's insurance will cover the strips and lancets.   Asked the pharmacy to let us know if the insurance do not cover.   They agreed.//AB/CMA

## 2016-09-05 ENCOUNTER — Encounter (HOSPITAL_COMMUNITY): Payer: Self-pay

## 2016-09-05 ENCOUNTER — Emergency Department (HOSPITAL_COMMUNITY)
Admission: EM | Admit: 2016-09-05 | Discharge: 2016-09-05 | Disposition: A | Discharge status: Home / Self Care | Payer: Medicare Other | Attending: Emergency Medicine | Admitting: Emergency Medicine

## 2016-09-05 DIAGNOSIS — I2510 Atherosclerotic heart disease of native coronary artery without angina pectoris: Secondary | ICD-10-CM | POA: Diagnosis not present

## 2016-09-05 DIAGNOSIS — N189 Chronic kidney disease, unspecified: Secondary | ICD-10-CM | POA: Insufficient documentation

## 2016-09-05 DIAGNOSIS — M109 Gout, unspecified: Secondary | ICD-10-CM

## 2016-09-05 DIAGNOSIS — Z7984 Long term (current) use of oral hypoglycemic drugs: Secondary | ICD-10-CM | POA: Diagnosis not present

## 2016-09-05 DIAGNOSIS — Z79899 Other long term (current) drug therapy: Secondary | ICD-10-CM | POA: Insufficient documentation

## 2016-09-05 DIAGNOSIS — I5022 Chronic systolic (congestive) heart failure: Secondary | ICD-10-CM | POA: Insufficient documentation

## 2016-09-05 DIAGNOSIS — Z87891 Personal history of nicotine dependence: Secondary | ICD-10-CM | POA: Insufficient documentation

## 2016-09-05 DIAGNOSIS — I130 Hypertensive heart and chronic kidney disease with heart failure and stage 1 through stage 4 chronic kidney disease, or unspecified chronic kidney disease: Secondary | ICD-10-CM | POA: Diagnosis not present

## 2016-09-05 DIAGNOSIS — M25561 Pain in right knee: Secondary | ICD-10-CM | POA: Diagnosis present

## 2016-09-05 DIAGNOSIS — M1009 Idiopathic gout, multiple sites: Secondary | ICD-10-CM | POA: Diagnosis not present

## 2016-09-05 DIAGNOSIS — E1122 Type 2 diabetes mellitus with diabetic chronic kidney disease: Secondary | ICD-10-CM | POA: Insufficient documentation

## 2016-09-05 DIAGNOSIS — J449 Chronic obstructive pulmonary disease, unspecified: Secondary | ICD-10-CM | POA: Diagnosis not present

## 2016-09-05 MED ORDER — OXYCODONE-ACETAMINOPHEN 5-325 MG PO TABS
1.0000 | ORAL_TABLET | Freq: Once | ORAL | Status: AC
  Administered 2016-09-05: 1 via ORAL
  Filled 2016-09-05: qty 1

## 2016-09-05 MED ORDER — TRAMADOL HCL 50 MG PO TABS: 50.0000 mg | ORAL_TABLET | Freq: Four times a day (QID) | ORAL | 0 refills | Status: DC | PRN

## 2016-09-05 NOTE — ED Notes (Signed)
MD notified of patient's blood pressure.  Patient to take blood pressure med when he gets home.

## 2016-09-05 NOTE — ED Provider Notes (Signed)
[facility] DEPT Provider Note   CSN: [id number] Arrival date & time: 09/05/16  0630     History   Chief Complaint Chief Complaint  Patient presents with  . Joint Swelling    HPI Patrick Stewart is a 60 y.o. male.  Patrick Stewart is a 60 y.o. Male who presents to the ED complaining of a gout flare for the past 3 days. Patient reports he has a history of gout and has had several gout flares in the past. He reports his mother also had gout. He reports he does not drink alcohol. He reports pain to his bilateral hands and bilateral knees. He reports his pain is worse in his right hand at his second MCP joint. He reports he is nothing for treatment of his symptoms today. He reports his pain as a 9 out of 10 and is worse with movement of his hand. He denies any injury or trauma to his joints. She denies fevers, chills, nausea, vomiting, rashes, abdominal pain, coughing, or injury to his joints.   The history is provided by the patient and medical records. No language interpreter was used.    Past Medical History:  Diagnosis Date  . Chronic bronchitis ([place])   . COPD with chronic bronchitis ([place]) 08/26/2016  . Diabetes mellitus without complication ([place])   . Gout   . Hypertension   . MI (myocardial infarction) [facility])     Patient Active Problem List   Diagnosis Date Noted  . COPD with chronic bronchitis ([place]) 08/26/2016  . Non-ST elevation MI (NSTEMI) ([place]) 07/08/2016  . Kidney stone on right side 10/25/2015  . CKD (chronic kidney disease) 10/13/2015  . Gout 10/13/2015  . Type 2 diabetes mellitus with chronic kidney disease ([place]) 10/13/2015  . Accelerated hypertension 10/13/2015  . Abnormal EKG 10/13/2015  . CAD Status post CABG 2 SVG to OM 1, LIMA to mid LAD: 10/13/2015  . Cardiomyopathy, ischemic, chronic systolic CHF 16/12/930  . Acute pericarditis 05/05/2015  . Essential hypertension 09/04/2014  . Benign prostatic hypertrophy with lower urinary tract symptoms (LUTS)  01/24/2013  . Peyronie's disease 12/18/2012  . Chronic joint pain 06/22/2012  . Congestive heart failure ([place]) 01/26/2012  . History of tobacco abuse 01/26/2012  . Inguinal hernia, right 01/26/2012  . Lipid disorder 01/26/2012    Past Surgical History:  Procedure Laterality Date  . CORONARY ARTERY BYPASS GRAFT    . KNEE SURGERY    . ROTATOR CUFF REPAIR    . WRIST SURGERY         Home Medications    Prior to Admission medications   Medication Sig Start Date End Date Taking? Authorizing Provider  albuterol (PROVENTIL) (2.5 MG/3ML) 0.083% nebulizer solution Take 3 mLs (2.5 mg total) by nebulization every 6 (six) hours as needed for wheezing or shortness of breath. 08/26/16   Shelda Pal, DO  atorvastatin (LIPITOR) 80 MG tablet Take 1 tablet (80 mg total) by mouth daily. 08/26/16   Shelda Pal, DO  carvedilol (COREG) 6.25 MG tablet Take 6.25 mg by mouth 2 (two) times daily. 10/25/15   Historical Provider, MD  Diclofenac Sodium CR (VOLTAREN-XR) 100 MG 24 hr tablet Take 1 tablet (100 mg total) by mouth daily. Patient taking differently: Take 100 mg by mouth daily as needed.  02/14/16   [name], MD  famotidine (PEPCID) 20 MG tablet Take 1 tablet (20 mg total) by mouth 2 (two) times daily. 04/29/16   Dorie Rank, MD  furosemide (LASIX) 80 MG tablet  Take 60 mg by mouth daily.  10/25/15   Historical Provider, MD  glucose blood test strip Use as instructed 08/26/16   Crosby Oyster Wendling, DO  isosorbide mononitrate (IMDUR) 30 MG 24 hr tablet Take 30 mg by mouth daily. 10/25/15   Historical Provider, MD  Lancets (FREESTYLE) lancets Use to check sugars daily 08/26/16   Crosby Oyster Wendling, DO  lisinopril (PRINIVIL,ZESTRIL) 40 MG tablet Take 40 mg by mouth daily. 03/27/16   Historical Provider, MD  metFORMIN (GLUCOPHAGE) 1000 MG tablet Take 1,000 mg by mouth 2 (two) times daily. 10/25/15   Historical Provider, MD  methocarbamol (ROBAXIN) 500 MG tablet Take 2 tablets  (1,000 mg total) by mouth every 8 (eight) hours as needed for muscle spasms. 04/29/16   Dorie Rank, MD  naproxen sodium (ANAPROX) 220 MG tablet Take 220 mg by mouth daily as needed (pain).     Historical Provider, MD  Nebulizers (AEROECLIPSE II NEBULIZER) MISC Use to administer albuterol every 6 hours as needed. 08/26/16   Crosby Oyster Wendling, DO  NIFEdipine (PROCARDIA-XL/ADALAT CC) 60 MG 24 hr tablet Take 1 tablet (60 mg total) by mouth daily. 08/26/16   Shelda Pal, DO  oxyCODONE-acetaminophen (PERCOCET/ROXICET) 5-325 MG tablet Take 1 tablet by mouth every 4 (four) hours as needed (for pain). 05/16/16   John Molpus, MD  predniSONE (DELTASONE) 50 MG tablet Take 1 tablet (50 mg total) by mouth daily. 08/23/16   Dalia Heading, PA-C  traMADol (ULTRAM) 50 MG tablet Take 1 tablet (50 mg total) by mouth every 6 (six) hours as needed for severe pain. 09/05/16   Waynetta Pean, PA-C    Family History Family History  Problem Relation Age of Onset  . Hypertension Mother   . Diabetes Mother   . Hyperlipidemia Mother   . Kidney failure Mother   . Emphysema Father     Social History Social History  Substance Use Topics  . Smoking status: Former Smoker    Packs/day: 0.25    Quit date: 07/10/2016  . Smokeless tobacco: Never Used  . Alcohol use No     Allergies   Hydrocodone and Tramadol   Review of Systems Review of Systems  Constitutional: Negative for chills and fever.  HENT: Negative for congestion and sore throat.   Respiratory: Negative for cough and shortness of breath.   Cardiovascular: Negative for chest pain.  Gastrointestinal: Negative for abdominal pain, nausea and vomiting.  Musculoskeletal: Positive for arthralgias and joint swelling. Negative for back pain and neck pain.  Skin: Negative for rash.  Neurological: Negative for headaches.     Physical Exam Updated Vital Signs BP (!) 186/112 (BP Location: Left Arm)   Pulse 83   Temp 98.2 F (36.8 C) (Oral)    Resp 17   SpO2 94%   Physical Exam  Constitutional: He appears well-developed and well-nourished. No distress.  Nontoxic appearing.  HENT:  Head: Normocephalic and atraumatic.  Eyes: Conjunctivae are normal. Pupils are equal, round, and reactive to light. Right eye exhibits no discharge. Left eye exhibits no discharge.  Neck: Neck supple.  Cardiovascular: Normal rate, regular rhythm, normal heart sounds and intact distal pulses.   Bilateral radial pulses are intact. Good capillary refill.  Pulmonary/Chest: Effort normal and breath sounds normal. No respiratory distress.  Abdominal: Soft. There is no tenderness.  Musculoskeletal: He exhibits edema and tenderness.  Tenderness and edema to his right MCP joint. Pain with ROM of bilateral hands. Good ROM of bilateral knees. No evidence of knee  joint edema or erythema.   Lymphadenopathy:    He has no cervical adenopathy.  Neurological: He is alert. Coordination normal.  Skin: Skin is warm and dry. Capillary refill takes less than 2 seconds. No rash noted. He is not diaphoretic.  Psychiatric: He has a normal mood and affect. His behavior is normal.  Nursing note and vitals reviewed.    ED Treatments / Results  Labs (all labs ordered are listed, but only abnormal results are displayed) Labs Reviewed - No data to display  EKG  EKG Interpretation None       Radiology No results found.  Procedures Procedures (including critical care time)  Medications Ordered in ED Medications  oxyCODONE-acetaminophen (PERCOCET/ROXICET) 5-325 MG per tablet 1 tablet (not administered)     Initial Impression / Assessment and Plan / ED Course  I have reviewed the triage vital signs and the nursing notes.  Pertinent labs & imaging results that were available during my care of the patient were reviewed by me and considered in my medical decision making (see chart for details).  Clinical Course   This is a 60 y.o. Male who presents to the ED  complaining of a gout flare for the past 3 days. Patient reports he has a history of gout and has had several gout flares in the past. He reports his mother also had gout. He reports he does not drink alcohol. He reports pain to his bilateral hands and bilateral knees. He reports his pain is worse in his right hand at his second MCP joint. He reports he is nothing for treatment of his symptoms today. He reports his pain as a 9 out of 10 and is worse with movement of his hand. He denies any injury or trauma to his joints. On exam the patient is afebrile nontoxic appearing. He has some mild edema and moderate tenderness to palpation over his right MCP joint. He has good range of motion of his bilateral hands. He has good range of motion of his bilateral knees. No evidence of septic joint. Patient does have diabetes and is on metformin. He also has a history of chronic kidney disease and his last creatinine is 1.65. This makes it difficult for him to take prednisone and any NSAID. He reports he does not drink alcohol. I did discuss diet changes to help with gout. Will discharge with a short course of tramadol and encouraged him to follow closely with primary care. He reports he has a follow-up appointment with his primary care doctor this coming week. I encouraged him to keep this appointment. I discussed return precautions. I advised the patient to follow-up with their primary care provider this week. I advised the patient to return to the emergency department with new or worsening symptoms or new concerns. The patient verbalized understanding and agreement with plan.      Final Clinical Impressions(s) / ED Diagnoses   Final diagnoses:  Acute gout of multiple sites, unspecified cause    New Prescriptions Current Discharge Medication List       Waynetta Pean, PA-C 09/05/16 [place], MD 09/06/16 [phone]

## 2016-09-05 NOTE — ED Triage Notes (Signed)
Patient c/o swelling in both hands and bilateral knees.  Patient stated that started x2 days ago.  Patient has been taking ibuprofen and tramadol and has not gotten any better.  Patient states that he think it's his gout.  Patient rates pain 9/10.

## 2016-09-11 ENCOUNTER — Ambulatory Visit (INDEPENDENT_AMBULATORY_CARE_PROVIDER_SITE_OTHER): Payer: Medicare Other | Admitting: Family Medicine

## 2016-09-11 ENCOUNTER — Ambulatory Visit (HOSPITAL_BASED_OUTPATIENT_CLINIC_OR_DEPARTMENT_OTHER)
Admission: RE | Admit: 2016-09-11 | Discharge: 2016-09-11 | Disposition: A | Discharge status: Home / Self Care | Payer: Medicare Other | Source: Ambulatory Visit | Attending: Family Medicine | Admitting: Family Medicine

## 2016-09-11 ENCOUNTER — Encounter: Payer: Self-pay | Admitting: Family Medicine

## 2016-09-11 VITALS — BP 138/80 | HR 81 | Temp 98.0°F | Ht 70.0 in | Wt 192.4 lb

## 2016-09-11 DIAGNOSIS — IMO0002 Reserved for concepts with insufficient information to code with codable children: Secondary | ICD-10-CM | POA: Insufficient documentation

## 2016-09-11 DIAGNOSIS — I25119 Atherosclerotic heart disease of native coronary artery with unspecified angina pectoris: Secondary | ICD-10-CM

## 2016-09-11 DIAGNOSIS — M180 Bilateral primary osteoarthritis of first carpometacarpal joints: Secondary | ICD-10-CM | POA: Insufficient documentation

## 2016-09-11 DIAGNOSIS — M79643 Pain in unspecified hand: Secondary | ICD-10-CM

## 2016-09-11 DIAGNOSIS — M109 Gout, unspecified: Secondary | ICD-10-CM | POA: Diagnosis not present

## 2016-09-11 DIAGNOSIS — E1165 Type 2 diabetes mellitus with hyperglycemia: Secondary | ICD-10-CM

## 2016-09-11 DIAGNOSIS — M79642 Pain in left hand: Secondary | ICD-10-CM | POA: Diagnosis not present

## 2016-09-11 DIAGNOSIS — M79641 Pain in right hand: Secondary | ICD-10-CM | POA: Diagnosis present

## 2016-09-11 MED ORDER — COLCHICINE 0.6 MG PO TABS: 0.6000 mg | ORAL_TABLET | Freq: Every day | ORAL | 0 refills | Status: DC

## 2016-09-11 MED ORDER — ALLOPURINOL 100 MG PO TABS: 200.0000 mg | ORAL_TABLET | Freq: Every day | ORAL | 6 refills | Status: DC

## 2016-09-11 MED ORDER — METHYLPREDNISOLONE SODIUM SUCC 125 MG IJ SOLR
62.0000 mg | Freq: Once | INTRAMUSCULAR | Status: AC
  Administered 2016-09-11: 62 mg via INTRAMUSCULAR

## 2016-09-11 NOTE — Progress Notes (Signed)
Subjective:   Chief Complaint  Patient presents with  . Follow-up    on DM and review recent labs  . Hand Pain    in the joints-chronic    Patrick Stewart is a 60 y.o. male here for follow-up of diabetes.   [name]'s self monitored glucose range is 130-140's. Patient denies hypoglycemic reactions. He checks his glucose levels 1 times per 2 day. Patient does not require insulin.  Metformin 1000 mg daily. Patient exercises rarely days per week on average.   Patient has had diabetic and nutritional education.   He does take an aspirin daily. Statin? Yes ACEi/ARB? Yes   Past Medical History:  Diagnosis Date  . COPD with chronic bronchitis ([place]) 08/26/2016  . Diabetes mellitus type 2, uncontrolled ([place]) 09/11/2016  . Gout   . Hypertension   . MI (myocardial infarction) [facility])     Past Surgical History:  Procedure Laterality Date  . CORONARY ARTERY BYPASS GRAFT    . KNEE SURGERY    . ROTATOR CUFF REPAIR    . WRIST SURGERY      Social History   Social History  . Marital status: Married   Social History Main Topics  . Smoking status: Former Smoker    Packs/day: 0.25    Quit date: 07/10/2016  . Smokeless tobacco: Never Used  . Alcohol use No  . Drug use: No  . Sexual activity: Yes   Current Outpatient Prescriptions on File Prior to Visit  Medication Sig Dispense Refill  . albuterol (PROVENTIL) (2.5 MG/3ML) 0.083% nebulizer solution Take 3 mLs (2.5 mg total) by nebulization every 6 (six) hours as needed for wheezing or shortness of breath. 75 mL 3  . atorvastatin (LIPITOR) 80 MG tablet Take 1 tablet (80 mg total) by mouth daily. 30 tablet 11  . carvedilol (COREG) 6.25 MG tablet Take 6.25 mg by mouth 2 (two) times daily.    . Diclofenac Sodium CR (VOLTAREN-XR) 100 MG 24 hr tablet Take 1 tablet (100 mg total) by mouth daily. (Patient taking differently: Take 100 mg by mouth daily as needed. ) 10 tablet 0  . famotidine (PEPCID) 20 MG tablet Take 1 tablet (20 mg total) by mouth 2  (two) times daily. 30 tablet 0  . furosemide (LASIX) 80 MG tablet Take 60 mg by mouth daily.     Marland Kitchen glucose blood test strip Use as instructed 100 each 3  . Lancets (FREESTYLE) lancets Use to check sugars daily 100 each 3  . lisinopril (PRINIVIL,ZESTRIL) 40 MG tablet Take 40 mg by mouth daily.  11  . metFORMIN (GLUCOPHAGE) 1000 MG tablet Take 1,000 mg by mouth 2 (two) times daily.    . methocarbamol (ROBAXIN) 500 MG tablet Take 2 tablets (1,000 mg total) by mouth every 8 (eight) hours as needed for muscle spasms. 30 tablet 0  . Nebulizers (AEROECLIPSE II NEBULIZER) MISC Use to administer albuterol every 6 hours as needed. 1 each 0  . NIFEdipine (PROCARDIA-XL/ADALAT CC) 60 MG 24 hr tablet Take 1 tablet (60 mg total) by mouth daily. 30 tablet 1  . traMADol (ULTRAM) 50 MG tablet Take 1 tablet (50 mg total) by mouth every 6 (six) hours as needed for severe pain. 15 tablet 0  . isosorbide mononitrate (IMDUR) 30 MG 24 hr tablet Take 30 mg by mouth daily.     Related testing: Foot exam(monofilament and inspection):done Retinal exam:done Date of retinal exam: Has never had done Pneumovax: 2013  Review of Systems: Eye:  No recent  significant change in vision Pulmonary:  No SOB Cardiovascular:  No chest pain, no palpitations MSK: +hand pain Neurologic:  No numbness, tingling  Objective:  BP 138/80 (BP Location: Left Arm, Patient Position: Sitting, Cuff Size: Normal)   Pulse 81   Temp 98 F (36.7 C) (Oral)   Ht 5\' 10"  (1.778 m)   Wt 192 lb 6.4 oz (87.3 kg)   SpO2 98%   BMI 27.61 kg/m  General:  Well developed, well nourished, in no apparent distress Skin:  Warm, no pallor or diaphoresis Head:  Normocephalic, atraumatic Eyes:  Pupils equal and round, sclera anicteric without injection  Nose:  External nares without trauma, no discharge Throat/Pharynx:  Lips and gingiva without lesion Neck: Neck supple.  No obvious thyromegaly or masses.  No bruits Lungs:  clear to auscultation, breath  sounds equal bilaterally, no wheezes, rales, or stridor Cardio:  regular rate and rhythm without murmurs. Musculoskeletal:  +TTP to light touch of 5th digit on L, Minor TTP over the 2nd R MTP. No warmth or significant swelling appreciated. Decreased active and passive ROM of the L 5th digit. Neuro:  Alert and oriented to person, place, and time.  Assessment:   Uncontrolled type 2 diabetes mellitus with hyperglycemia, without long-term current use of insulin ([place]) - Plan: Ambulatory referral to Ophthalmology  Gout of right knee, unspecified cause, unspecified chronicity - Plan: allopurinol (ZYLOPRIM) 100 MG tablet, colchicine 0.6 MG tablet  Pain of hand, unspecified laterality - Plan: DG Hand Complete Left, DG Hand Complete Right   Plan:   Orders as above. He would like to proceed with lifestyle modifications for now. Will strongly suggest adding another medication if he is not improved in the next 4 weeks. Will start at 200 mg Allopurinol given renal function. Recheck this at next appt. Will make sure no underlying pathosis causing hand pain with XR.   F/u in monthly until controlled. The patient voiced understanding and agreement to the plan.  [place], DO 09/11/16 9:31 AM

## 2016-09-11 NOTE — Patient Instructions (Addendum)
Diabetes and Exercise Exercising regularly is important. It is not just about losing weight. It has many health benefits, such as:  Improving your overall fitness, flexibility, and endurance.  Increasing your bone density.  Helping with weight control.  Decreasing your body fat.  Increasing your muscle strength.  Reducing stress and tension.  Improving your overall health. People with diabetes who exercise gain additional benefits because exercise:  Reduces appetite.  Improves the body's use of blood sugar (glucose).  Helps lower or control blood glucose.  Decreases blood pressure.  Helps control blood lipids (such as cholesterol and triglycerides).  Improves the body's use of the hormone insulin by:  Increasing the body's insulin sensitivity.  Reducing the body's insulin needs.  Decreases the risk for heart disease because exercising:  Lowers cholesterol and triglycerides levels.  Increases the levels of good cholesterol (such as high-density lipoproteins [HDL]) in the body.  Lowers blood glucose levels. YOUR ACTIVITY PLAN  Choose an activity that you enjoy, and set realistic goals. To exercise safely, you should begin practicing any new physical activity slowly, and gradually increase the intensity of the exercise over time. Your health care provider or diabetes educator can help create an activity plan that works for you. General recommendations include:  Encouraging children to engage in at least 60 minutes of physical activity each day.  Stretching and performing strength training exercises, such as yoga or weight lifting, at least 2 times per week.  Performing a total of at least 150 minutes of moderate-intensity exercise each week, such as brisk walking or water aerobics.  Exercising at least 3 days per week, making sure you allow no more than 2 consecutive days to pass without exercising.  Avoiding long periods of inactivity (90 minutes or more). When you  have to spend an extended period of time sitting down, take frequent breaks to walk or stretch. RECOMMENDATIONS FOR EXERCISING WITH TYPE 1 OR TYPE 2 DIABETES   Check your blood glucose before exercising. If blood glucose levels are greater than 240 mg/dL, check for urine ketones. Do not exercise if ketones are present.  Avoid injecting insulin into areas of the body that are going to be exercised. For example, avoid injecting insulin into:  The arms when playing tennis.  The legs when jogging.  Keep a record of:  Food intake before and after you exercise.  Expected peak times of insulin action.  Blood glucose levels before and after you exercise.  The type and amount of exercise you have done.  Review your records with your health care provider. Your health care provider will help you to develop guidelines for adjusting food intake and insulin amounts before and after exercising.  If you take insulin or oral hypoglycemic agents, watch for signs and symptoms of hypoglycemia. They include:  Dizziness.  Shaking.  Sweating.  Chills.  Confusion.  Drink plenty of water while you exercise to prevent dehydration or heat stroke. Body water is lost during exercise and must be replaced.  Talk to your health care provider before starting an exercise program to make sure it is safe for you. Remember, almost any type of activity is better than none.   This information is not intended to replace advice given to you by your health care provider. Make sure you discuss any questions you have with your health care provider.   Document Released: 03/05/2004 Document Revised: 04/30/2015 Document Reviewed: 05/23/2013 Elsevier Interactive Patient Education 2016 [employer]  

## 2016-09-11 NOTE — Addendum Note (Signed)
Addended by: Harl Bowie on: 09/11/2016 11:28 AM   Modules accepted: Orders

## 2016-09-23 ENCOUNTER — Emergency Department (HOSPITAL_BASED_OUTPATIENT_CLINIC_OR_DEPARTMENT_OTHER): Payer: Medicare Other

## 2016-09-23 ENCOUNTER — Encounter (HOSPITAL_BASED_OUTPATIENT_CLINIC_OR_DEPARTMENT_OTHER): Payer: Self-pay

## 2016-09-23 ENCOUNTER — Emergency Department (HOSPITAL_BASED_OUTPATIENT_CLINIC_OR_DEPARTMENT_OTHER)
Admission: EM | Admit: 2016-09-23 | Discharge: 2016-09-23 | Disposition: A | Discharge status: Home / Self Care | Payer: Medicare Other | Attending: Emergency Medicine | Admitting: Emergency Medicine

## 2016-09-23 DIAGNOSIS — L03116 Cellulitis of left lower limb: Secondary | ICD-10-CM | POA: Insufficient documentation

## 2016-09-23 DIAGNOSIS — J449 Chronic obstructive pulmonary disease, unspecified: Secondary | ICD-10-CM | POA: Diagnosis not present

## 2016-09-23 DIAGNOSIS — M7989 Other specified soft tissue disorders: Secondary | ICD-10-CM | POA: Diagnosis not present

## 2016-09-23 DIAGNOSIS — E119 Type 2 diabetes mellitus without complications: Secondary | ICD-10-CM | POA: Insufficient documentation

## 2016-09-23 DIAGNOSIS — Z79899 Other long term (current) drug therapy: Secondary | ICD-10-CM | POA: Insufficient documentation

## 2016-09-23 DIAGNOSIS — Z7984 Long term (current) use of oral hypoglycemic drugs: Secondary | ICD-10-CM | POA: Insufficient documentation

## 2016-09-23 DIAGNOSIS — L03115 Cellulitis of right lower limb: Secondary | ICD-10-CM | POA: Diagnosis not present

## 2016-09-23 DIAGNOSIS — M79662 Pain in left lower leg: Secondary | ICD-10-CM | POA: Diagnosis present

## 2016-09-23 DIAGNOSIS — I10 Essential (primary) hypertension: Secondary | ICD-10-CM | POA: Insufficient documentation

## 2016-09-23 DIAGNOSIS — Z87891 Personal history of nicotine dependence: Secondary | ICD-10-CM | POA: Insufficient documentation

## 2016-09-23 MED ORDER — DOXYCYCLINE HYCLATE 100 MG PO CAPS: 100.0000 mg | ORAL_CAPSULE | Freq: Two times a day (BID) | ORAL | 0 refills | Status: DC

## 2016-09-23 MED ORDER — CEPHALEXIN 500 MG PO CAPS: 500.0000 mg | ORAL_CAPSULE | Freq: Two times a day (BID) | ORAL | 0 refills | Status: DC

## 2016-09-23 MED ORDER — OXYCODONE HCL 5 MG PO TABS: 5.0000 mg | ORAL_TABLET | Freq: Two times a day (BID) | ORAL | 0 refills | Status: DC | PRN

## 2016-09-23 MED ORDER — DOXYCYCLINE HYCLATE 100 MG PO TABS
100.0000 mg | ORAL_TABLET | Freq: Once | ORAL | Status: AC
  Administered 2016-09-23: 100 mg via ORAL
  Filled 2016-09-23: qty 1

## 2016-09-23 MED ORDER — CEPHALEXIN 250 MG PO CAPS
500.0000 mg | ORAL_CAPSULE | Freq: Once | ORAL | Status: AC
  Administered 2016-09-23: 500 mg via ORAL
  Filled 2016-09-23: qty 2

## 2016-09-23 NOTE — ED Provider Notes (Signed)
[facility] DEPT [facility] Provider Note   CSN: [id number] Arrival date & time: 09/23/16  0254     History   Chief Complaint Chief Complaint  Patient presents with  . Knee Pain    HPI Patrick Stewart is a 60 y.o. male PMH of gout, here with Pain in the left lower extremity. Patient states this is been going on for the past 24 hours. He admits to swelling in the left leg as well as worsening pain. He has a history of gout he is unsure if this is consistent with this. He states his primary doctor skin of indomethacin but that has not relieved his pain today. He denies any trauma to the area. He denies any fevers. There are no further complaints.  10 Systems reviewed and are negative for acute change except as noted in the HPI.    HPI  Past Medical History:  Diagnosis Date  . COPD with chronic bronchitis ([facility]) 08/26/2016  . Diabetes mellitus type 2, uncontrolled ([place]) 09/11/2016  . Gout   . Hypertension   . MI (myocardial infarction) [facility])     Patient Active Problem List   Diagnosis Date Noted  . Diabetes mellitus type 2, uncontrolled ([place]) 09/11/2016  . COPD with chronic bronchitis ([place]) 08/26/2016  . Non-ST elevation MI (NSTEMI) ([place]) 07/08/2016  . Kidney stone on right side 10/25/2015  . CKD (chronic kidney disease) 10/13/2015  . Gout 10/13/2015  . Accelerated hypertension 10/13/2015  . Abnormal EKG 10/13/2015  . CAD Status post CABG 2 SVG to OM 1, LIMA to mid LAD: 10/13/2015  . Cardiomyopathy, ischemic, chronic systolic CHF 18/29/9371  . Acute pericarditis 05/05/2015  . Essential hypertension 09/04/2014  . Benign prostatic hypertrophy with lower urinary tract symptoms (LUTS) 01/24/2013  . Peyronie's disease 12/18/2012  . Chronic joint pain 06/22/2012  . Congestive heart failure ([place]) 01/26/2012  . History of tobacco abuse 01/26/2012  . Inguinal hernia, right 01/26/2012  . Lipid disorder 01/26/2012    Past Surgical History:  Procedure Laterality Date  . CORONARY  ARTERY BYPASS GRAFT    . KNEE SURGERY    . ROTATOR CUFF REPAIR    . WRIST SURGERY         Home Medications    Prior to Admission medications   Medication Sig Start Date End Date Taking? Authorizing Provider  albuterol (PROVENTIL) (2.5 MG/3ML) 0.083% nebulizer solution Take 3 mLs (2.5 mg total) by nebulization every 6 (six) hours as needed for wheezing or shortness of breath. 08/26/16   Shelda Pal, DO  allopurinol (ZYLOPRIM) 100 MG tablet Take 2 tablets (200 mg total) by mouth daily. Take 1 tab daily for 1st week. 09/11/16   Shelda Pal, DO  atorvastatin (LIPITOR) 80 MG tablet Take 1 tablet (80 mg total) by mouth daily. 08/26/16   Shelda Pal, DO  carvedilol (COREG) 6.25 MG tablet Take 6.25 mg by mouth 2 (two) times daily. 10/25/15   Historical Provider, MD  cephALEXin (KEFLEX) 500 MG capsule Take 1 capsule (500 mg total) by mouth 2 (two) times daily. 09/23/16   Everlene Balls, MD  colchicine 0.6 MG tablet Take 1 tablet (0.6 mg total) by mouth daily. Take 2 tabs initially followed by 1 tab 1 hr later. 09/11/16   Shelda Pal, DO  Diclofenac Sodium CR (VOLTAREN-XR) 100 MG 24 hr tablet Take 1 tablet (100 mg total) by mouth daily. Patient taking differently: Take 100 mg by mouth daily as needed.  02/14/16   Veatrice Kells, MD  doxycycline (VIBRAMYCIN) 100 MG capsule Take 1 capsule (100 mg total) by mouth 2 (two) times daily. One po bid x 7 days 09/23/16   Everlene Balls, MD  famotidine (PEPCID) 20 MG tablet Take 1 tablet (20 mg total) by mouth 2 (two) times daily. 04/29/16   Dorie Rank, MD  furosemide (LASIX) 80 MG tablet Take 60 mg by mouth daily.  10/25/15   Historical Provider, MD  glucose blood test strip Use as instructed 08/26/16   Crosby Oyster Wendling, DO  isosorbide mononitrate (IMDUR) 30 MG 24 hr tablet Take 30 mg by mouth daily. 10/25/15   Historical Provider, MD  Lancets (FREESTYLE) lancets Use to check sugars daily 08/26/16   Crosby Oyster Wendling, DO    lisinopril (PRINIVIL,ZESTRIL) 40 MG tablet Take 40 mg by mouth daily. 03/27/16   Historical Provider, MD  metFORMIN (GLUCOPHAGE) 1000 MG tablet Take 1,000 mg by mouth 2 (two) times daily. 10/25/15   Historical Provider, MD  methocarbamol (ROBAXIN) 500 MG tablet Take 2 tablets (1,000 mg total) by mouth every 8 (eight) hours as needed for muscle spasms. 04/29/16   Dorie Rank, MD  Nebulizers (AEROECLIPSE II NEBULIZER) MISC Use to administer albuterol every 6 hours as needed. 08/26/16   Crosby Oyster Wendling, DO  NIFEdipine (PROCARDIA-XL/ADALAT CC) 60 MG 24 hr tablet Take 1 tablet (60 mg total) by mouth daily. 08/26/16   Shelda Pal, DO  oxyCODONE (ROXICODONE) 5 MG immediate release tablet Take 1 tablet (5 mg total) by mouth 2 (two) times daily as needed for severe pain. 09/23/16   Everlene Balls, MD  traMADol (ULTRAM) 50 MG tablet Take 1 tablet (50 mg total) by mouth every 6 (six) hours as needed for severe pain. 09/05/16   Waynetta Pean, PA-C    Family History Family History  Problem Relation Age of Onset  . Hypertension Mother   . Diabetes Mother   . Hyperlipidemia Mother   . Kidney failure Mother   . Emphysema Father     Social History Social History  Substance Use Topics  . Smoking status: Former Smoker    Packs/day: 0.25    Quit date: 07/10/2016  . Smokeless tobacco: Never Used  . Alcohol use No     Allergies   Hydrocodone and Tramadol   Review of Systems Review of Systems   Physical Exam Updated Vital Signs BP 172/95 (BP Location: Right Arm)   Pulse 88   Temp 98.3 F (36.8 C) (Oral)   Resp 16   SpO2 99%   Physical Exam  Constitutional: He is oriented to person, place, and time. Vital signs are normal. He appears well-developed and well-nourished.  Non-toxic appearance. He does not appear ill. No distress.  HENT:  Head: Normocephalic and atraumatic.  Nose: Nose normal.  Mouth/Throat: Oropharynx is clear and moist. No oropharyngeal exudate.  Eyes: Conjunctivae  and EOM are normal. Pupils are equal, round, and reactive to light. No scleral icterus.  Neck: Normal range of motion. Neck supple. No tracheal deviation, no edema, no erythema and normal range of motion present. No thyroid mass and no thyromegaly present.  Cardiovascular: Normal rate, regular rhythm, S1 normal, S2 normal, normal heart sounds, intact distal pulses and normal pulses.  Exam reveals no gallop and no friction rub.   No murmur heard. Pulmonary/Chest: Effort normal and breath sounds normal. No respiratory distress. He has no wheezes. He has no rhonchi. He has no rales.  Abdominal: Soft. Normal appearance and bowel sounds are normal. He exhibits no distension,  no ascites and no mass. There is no hepatosplenomegaly. There is no tenderness. There is no rebound, no guarding and no CVA tenderness.  Musculoskeletal: Normal range of motion. He exhibits edema and tenderness.  Left lower extremity edema to the ankle. Tenderness to palpation of the proximal lateral left tibia. There is warmth and induration.  Lymphadenopathy:    He has no cervical adenopathy.  Neurological: He is alert and oriented to person, place, and time. He has normal strength. No cranial nerve deficit or sensory deficit.  Skin: Skin is warm, dry and intact. No petechiae and no rash noted. He is not diaphoretic. No erythema. No pallor.  Nursing note and vitals reviewed.    ED Treatments / Results  Labs (all labs ordered are listed, but only abnormal results are displayed) Labs Reviewed - No data to display  EKG  EKG Interpretation None       Radiology Dg Tibia/fibula Left  Result Date: 09/23/2016 CLINICAL DATA:  Lateral LEFT knee pain for 1 day. Warmth and swelling. History of vein stripping for CABG. EXAM: LEFT TIBIA AND FIBULA - 2 VIEW COMPARISON:  None. FINDINGS: There is no evidence of fracture or other focal bone lesions. Included view of the knee demonstrates suspected suprapatellar joint effusion. Vascular  clips within medial knee soft tissues . No subcutaneous gas or radiopaque foreign bodies. IMPRESSION: Partially imaged probable suprapatellar joint effusion would be better characterized on dedicated knee radiographs. No acute osseous process. Electronically Signed   By: Elon Alas M.D.   On: 09/23/2016 04:59    Procedures Procedures (including critical care time)  Medications Ordered in ED Medications  cephALEXin (KEFLEX) capsule 500 mg (500 mg Oral Given 09/23/16 0453)  doxycycline (VIBRA-TABS) tablet 100 mg (100 mg Oral Given 09/23/16 0453)     Initial Impression / Assessment and Plan / ED Course  I have reviewed the triage vital signs and the nursing notes.  Pertinent labs & imaging results that were available during my care of the patient were reviewed by me and considered in my medical decision making (see chart for details).  Clinical Course    Patient presents emergency department for left lower history pain. Physical exam is concerning for possible cellulitis. Will obtain x-rays for any gas formation.   5:13 AM Xrays reveal an effusion.  I called and spoke with the radiologist and they deny seeing any knee effusion.  Upon repeat evaluation, patients knee is not warm or swollen. There is no TTP.  His only symptoms are overlying the skin laterally over the fibular head.  There is an area of induration and warmth there.  There is TTP as well.  Will give keflex and doxy for treatment.  Oxycodone for severe pain.  PCP wound check advised within 3 days. He appears well and in NAD. vS remain within his normal limits and he is safe for Dc.  Final Clinical Impressions(s) / ED Diagnoses   Final diagnoses:  Cellulitis of right lower extremity    New Prescriptions New Prescriptions   CEPHALEXIN (KEFLEX) 500 MG CAPSULE    Take 1 capsule (500 mg total) by mouth 2 (two) times daily.   DOXYCYCLINE (VIBRAMYCIN) 100 MG CAPSULE    Take 1 capsule (100 mg total) by mouth 2 (two) times  daily. One po bid x 7 days   OXYCODONE (ROXICODONE) 5 MG IMMEDIATE RELEASE TABLET    Take 1 tablet (5 mg total) by mouth 2 (two) times daily as needed for severe pain.  Everlene Balls, MD 09/23/16 [id number]

## 2016-09-23 NOTE — ED Triage Notes (Signed)
Pt c/o left knee pain without injury that started today

## 2016-09-23 NOTE — ED Notes (Signed)
Pt verbalizes understanding of d/c instructions and denies any further needs at this time. 

## 2016-09-25 ENCOUNTER — Encounter (HOSPITAL_BASED_OUTPATIENT_CLINIC_OR_DEPARTMENT_OTHER): Payer: Self-pay | Admitting: *Deleted

## 2016-09-25 ENCOUNTER — Ambulatory Visit (INDEPENDENT_AMBULATORY_CARE_PROVIDER_SITE_OTHER): Payer: Medicare Other | Admitting: Family Medicine

## 2016-09-25 ENCOUNTER — Emergency Department (HOSPITAL_BASED_OUTPATIENT_CLINIC_OR_DEPARTMENT_OTHER): Payer: Medicare Other

## 2016-09-25 ENCOUNTER — Emergency Department (HOSPITAL_BASED_OUTPATIENT_CLINIC_OR_DEPARTMENT_OTHER)
Admission: EM | Admit: 2016-09-25 | Discharge: 2016-09-25 | Disposition: A | Discharge status: Home / Self Care | Payer: Medicare Other | Attending: Emergency Medicine | Admitting: Emergency Medicine

## 2016-09-25 VITALS — BP 170/80 | HR 111 | Temp 102.5°F | Ht 70.0 in

## 2016-09-25 DIAGNOSIS — J449 Chronic obstructive pulmonary disease, unspecified: Secondary | ICD-10-CM | POA: Insufficient documentation

## 2016-09-25 DIAGNOSIS — E1122 Type 2 diabetes mellitus with diabetic chronic kidney disease: Secondary | ICD-10-CM | POA: Insufficient documentation

## 2016-09-25 DIAGNOSIS — I2510 Atherosclerotic heart disease of native coronary artery without angina pectoris: Secondary | ICD-10-CM | POA: Diagnosis not present

## 2016-09-25 DIAGNOSIS — R509 Fever, unspecified: Secondary | ICD-10-CM

## 2016-09-25 DIAGNOSIS — M10062 Idiopathic gout, left knee: Secondary | ICD-10-CM | POA: Diagnosis not present

## 2016-09-25 DIAGNOSIS — M25562 Pain in left knee: Secondary | ICD-10-CM

## 2016-09-25 DIAGNOSIS — Z7984 Long term (current) use of oral hypoglycemic drugs: Secondary | ICD-10-CM | POA: Insufficient documentation

## 2016-09-25 DIAGNOSIS — I10 Essential (primary) hypertension: Secondary | ICD-10-CM | POA: Diagnosis not present

## 2016-09-25 DIAGNOSIS — N189 Chronic kidney disease, unspecified: Secondary | ICD-10-CM | POA: Insufficient documentation

## 2016-09-25 DIAGNOSIS — Z79899 Other long term (current) drug therapy: Secondary | ICD-10-CM | POA: Insufficient documentation

## 2016-09-25 DIAGNOSIS — I25119 Atherosclerotic heart disease of native coronary artery with unspecified angina pectoris: Secondary | ICD-10-CM | POA: Diagnosis not present

## 2016-09-25 DIAGNOSIS — Z87891 Personal history of nicotine dependence: Secondary | ICD-10-CM | POA: Diagnosis not present

## 2016-09-25 DIAGNOSIS — I509 Heart failure, unspecified: Secondary | ICD-10-CM | POA: Diagnosis not present

## 2016-09-25 DIAGNOSIS — I130 Hypertensive heart and chronic kidney disease with heart failure and stage 1 through stage 4 chronic kidney disease, or unspecified chronic kidney disease: Secondary | ICD-10-CM | POA: Diagnosis not present

## 2016-09-25 DIAGNOSIS — M109 Gout, unspecified: Secondary | ICD-10-CM

## 2016-09-25 DIAGNOSIS — I252 Old myocardial infarction: Secondary | ICD-10-CM | POA: Insufficient documentation

## 2016-09-25 LAB — CBC WITH DIFFERENTIAL/PLATELET
BASOS ABS: 0 10*3/uL (ref 0.0–0.1)
BASOS PCT: 0 %
EOS ABS: 0.1 10*3/uL (ref 0.0–0.7)
EOS PCT: 1 %
HCT: 38.6 % — ABNORMAL LOW (ref 39.0–52.0)
Hemoglobin: 13 g/dL (ref 13.0–17.0)
Lymphocytes Relative: 15 %
Lymphs Abs: 1.3 10*3/uL (ref 0.7–4.0)
MCH: 30.9 pg (ref 26.0–34.0)
MCHC: 33.7 g/dL (ref 30.0–36.0)
MCV: 91.7 fL (ref 78.0–100.0)
MONO ABS: 1.2 10*3/uL — AB (ref 0.1–1.0)
Monocytes Relative: 13 %
Neutro Abs: 6.5 10*3/uL (ref 1.7–7.7)
Neutrophils Relative %: 71 %
PLATELETS: 196 10*3/uL (ref 150–400)
RBC: 4.21 MIL/uL — AB (ref 4.22–5.81)
RDW: 13 % (ref 11.5–15.5)
WBC: 9.2 10*3/uL (ref 4.0–10.5)

## 2016-09-25 LAB — COMPREHENSIVE METABOLIC PANEL
ALBUMIN: 3.8 g/dL (ref 3.5–5.0)
ALK PHOS: 57 U/L (ref 38–126)
ALT: 10 U/L — ABNORMAL LOW (ref 17–63)
AST: 20 U/L (ref 15–41)
Anion gap: 13 (ref 5–15)
BILIRUBIN TOTAL: 1 mg/dL (ref 0.3–1.2)
BUN: 20 mg/dL (ref 6–20)
CALCIUM: 9.1 mg/dL (ref 8.9–10.3)
CO2: 21 mmol/L — ABNORMAL LOW (ref 22–32)
CREATININE: 1.65 mg/dL — AB (ref 0.61–1.24)
Chloride: 101 mmol/L (ref 101–111)
GFR calc Af Amer: 50 mL/min — ABNORMAL LOW (ref >60)
GFR, EST NON AFRICAN AMERICAN: 43 mL/min — AB (ref >60)
GLUCOSE: 128 mg/dL — AB (ref 65–99)
POTASSIUM: 3.5 mmol/L (ref 3.5–5.1)
Sodium: 135 mmol/L (ref 135–145)
TOTAL PROTEIN: 8.8 g/dL — AB (ref 6.5–8.1)

## 2016-09-25 LAB — URINALYSIS, ROUTINE W REFLEX MICROSCOPIC
Glucose, UA: NEGATIVE mg/dL
Ketones, ur: 15 mg/dL — AB
Leukocytes, UA: NEGATIVE
Nitrite: NEGATIVE
PH: 5.5 (ref 5.0–8.0)
Protein, ur: >300 mg/dL — AB
SPECIFIC GRAVITY, URINE: 1.027 (ref 1.005–1.030)

## 2016-09-25 LAB — SYNOVIAL CELL COUNT + DIFF, W/ CRYSTALS
EOSINOPHILS-SYNOVIAL: 0 % (ref 0–1)
Lymphocytes-Synovial Fld: 0 % (ref 0–20)
Monocyte-Macrophage-Synovial Fluid: 4 % — ABNORMAL LOW (ref 50–90)
Neutrophil, Synovial: 96 % — ABNORMAL HIGH (ref 0–25)
WBC, SYNOVIAL: 65500 /mm3 — AB (ref 0–200)

## 2016-09-25 LAB — SEDIMENTATION RATE: Sed Rate: 80 mm/hr — ABNORMAL HIGH (ref 0–16)

## 2016-09-25 LAB — URINE MICROSCOPIC-ADD ON

## 2016-09-25 LAB — C-REACTIVE PROTEIN: CRP: 25.5 mg/dL — AB (ref <1.0)

## 2016-09-25 LAB — I-STAT CG4 LACTIC ACID, ED: LACTIC ACID, VENOUS: 2.36 mmol/L — AB (ref 0.5–1.9)

## 2016-09-25 MED ORDER — OXYCODONE-ACETAMINOPHEN 5-325 MG PO TABS: 2.0000 | ORAL_TABLET | Freq: Four times a day (QID) | ORAL | 0 refills | Status: DC | PRN

## 2016-09-25 MED ORDER — SODIUM CHLORIDE 0.9 % IV BOLUS (SEPSIS)
1000.0000 mL | Freq: Once | INTRAVENOUS | Status: AC
  Administered 2016-09-25: 1000 mL via INTRAVENOUS

## 2016-09-25 MED ORDER — OXYCODONE-ACETAMINOPHEN 5-325 MG PO TABS
2.0000 | ORAL_TABLET | Freq: Once | ORAL | Status: AC
  Administered 2016-09-25: 2 via ORAL
  Filled 2016-09-25: qty 2

## 2016-09-25 MED ORDER — LIDOCAINE HCL (PF) 1 % IJ SOLN
5.0000 mL | Freq: Once | INTRAMUSCULAR | Status: AC
  Administered 2016-09-25: 5 mL via INTRADERMAL
  Filled 2016-09-25: qty 5

## 2016-09-25 NOTE — Progress Notes (Signed)
Pre visit review using our clinic review tool, if applicable. No additional management support is needed unless otherwise documented below in the visit note. 

## 2016-09-25 NOTE — ED Notes (Signed)
MD at bedside. 

## 2016-09-25 NOTE — ED Notes (Signed)
Pt diaphoretic but denies any other symptoms. Reports pain has decreased. Temperature taken 99.7. EDP to be notified.   EDP presented to bedside.

## 2016-09-25 NOTE — Progress Notes (Signed)
Chief Complaint  Patient presents with  . ER follow-up    (L) knee pain    Subjective: Patient is a 60 y.o. male here for ER f/u.  Dx'd at ED on 9/27 w cellulitis of L knee, rx'd Keflex, Doxy, and Oxycodone. He does have a history of gout. Since that time, he has had more swelling in the knee, he has been unable to bend it, and has been having continued pain. Recently he developed a fever. His biggest concern is that he is not able to walk, move his knee, or bear the pain. He feels that the pain is on the inside of his knee.  ROS: MSK: +knee pain, + decreased range of motion Const: +fever   Family History  Problem Relation Age of Onset  . Hypertension Mother   . Diabetes Mother   . Hyperlipidemia Mother   . Kidney failure Mother   . Emphysema Father    Past Medical History:  Diagnosis Date  . COPD with chronic bronchitis ([place]) 08/26/2016  . Diabetes mellitus type 2, uncontrolled ([place]) 09/11/2016  . Gout   . Hypertension   . MI (myocardial infarction) ([place])    Allergies  Allergen Reactions  . Hydrocodone Itching  . Tramadol Nausea Only    Current Outpatient Prescriptions:  .  albuterol (PROVENTIL) (2.5 MG/3ML) 0.083% nebulizer solution, Take 3 mLs (2.5 mg total) by nebulization every 6 (six) hours as needed for wheezing or shortness of breath., Disp: 75 mL, Rfl: 3 .  allopurinol (ZYLOPRIM) 100 MG tablet, Take 2 tablets (200 mg total) by mouth daily. Take 1 tab daily for 1st week., Disp: 60 tablet, Rfl: 6 .  atorvastatin (LIPITOR) 80 MG tablet, Take 1 tablet (80 mg total) by mouth daily., Disp: 30 tablet, Rfl: 11 .  carvedilol (COREG) 6.25 MG tablet, Take 6.25 mg by mouth 2 (two) times daily., Disp: , Rfl:  .  cephALEXin (KEFLEX) 500 MG capsule, Take 1 capsule (500 mg total) by mouth 2 (two) times daily., Disp: 14 capsule, Rfl: 0 .  colchicine 0.6 MG tablet, Take 1 tablet (0.6 mg total) by mouth daily. Take 2 tabs initially followed by 1 tab 1 hr later., Disp: 4 tablet, Rfl:  0 .  Diclofenac Sodium CR (VOLTAREN-XR) 100 MG 24 hr tablet, Take 1 tablet (100 mg total) by mouth daily. (Patient taking differently: Take 100 mg by mouth daily as needed. ), Disp: 10 tablet, Rfl: 0 .  doxycycline (VIBRAMYCIN) 100 MG capsule, Take 1 capsule (100 mg total) by mouth 2 (two) times daily. One po bid x 7 days, Disp: 14 capsule, Rfl: 0 .  famotidine (PEPCID) 20 MG tablet, Take 1 tablet (20 mg total) by mouth 2 (two) times daily., Disp: 30 tablet, Rfl: 0 .  furosemide (LASIX) 80 MG tablet, Take 60 mg by mouth daily. , Disp: , Rfl:  .  glucose blood test strip, Use as instructed, Disp: 100 each, Rfl: 3 .  isosorbide mononitrate (IMDUR) 60 MG 24 hr tablet, Take 1 tablet by mouth daily., Disp: , Rfl:  .  Lancets (FREESTYLE) lancets, Use to check sugars daily, Disp: 100 each, Rfl: 3 .  lisinopril (PRINIVIL,ZESTRIL) 40 MG tablet, Take 40 mg by mouth daily., Disp: , Rfl: 11 .  metFORMIN (GLUCOPHAGE) 1000 MG tablet, Take 1,000 mg by mouth 2 (two) times daily., Disp: , Rfl:  .  methocarbamol (ROBAXIN) 500 MG tablet, Take 2 tablets (1,000 mg total) by mouth every 8 (eight) hours as needed for muscle spasms.,  Disp: 30 tablet, Rfl: 0 .  Nebulizers (AEROECLIPSE II NEBULIZER) MISC, Use to administer albuterol every 6 hours as needed., Disp: 1 each, Rfl: 0 .  NIFEdipine (PROCARDIA-XL/ADALAT CC) 60 MG 24 hr tablet, Take 1 tablet (60 mg total) by mouth daily., Disp: 30 tablet, Rfl: 1 .  oxyCODONE (ROXICODONE) 5 MG immediate release tablet, Take 1 tablet (5 mg total) by mouth 2 (two) times daily as needed for severe pain., Disp: 8 tablet, Rfl: 0 .  traMADol (ULTRAM) 50 MG tablet, Take 1 tablet (50 mg total) by mouth every 6 (six) hours as needed for severe pain., Disp: 15 tablet, Rfl: 0  Objective: BP (!) 170/80 (BP Location: Left Arm, Patient Position: Sitting, Cuff Size: Large)   Pulse (!) 111   Temp (!) 102.5 F (39.2 C) (Oral)   Ht 5\' 10"  (1.778 m)   SpO2 98%  General: Awake, appears stated  age Heart: RRR, no murmurs Lungs: CTAB, no rales, wheezes or rhonchi. Normal effort MSK: L knee- significantly warmer on the left compared to the right, there is an effusion, no significant tenderness to palpation, he is unable to bend it or extend it from his current resting position. Psych: Age appropriate judgment and insight, he was tearful during the exam due to pain  Assessment and Plan: Left knee pain  Fever, unspecified  While gout is still on the differential, his pain and development of a fever gives concern for septic joint. Spoke with the provider in the emergency department downstairs to see if this is something they can manage or if we would need to send him to the hospital. I do not appreciate any cellulitis over the area and hoping that the ER can get both labs and tap the joint. The patient was wheeled down to the emergency department. He voiced understanding and agreement to this. Follow-up pending the workup. The patient voiced understanding and agreement to the plan.  [place], DO 09/25/16  11:48 AM

## 2016-09-25 NOTE — ED Notes (Signed)
Per EDP hold on second set of cultures

## 2016-09-25 NOTE — Discharge Instructions (Signed)
The culture from your knee is pending. You should receive a call if the culture is positive. Please call Dr. Trevor Mace office 4. then first thing Monday morning.  He will be in his office at [facility] on Monday morning for an appointment there. If her symptoms worsen over the weekend, he developed worsening fevers, nausea, vomiting, lightheadedness, increasing pain or other concerns, please return to an emergency department for reevaluation.  Continue to take the antibiotics as prescribed by Dr. Claudine Mouton.

## 2016-09-25 NOTE — ED Triage Notes (Signed)
Pt sent here from PMD office eval of left knee , pt c/o left knee pain x 3 days, denies injury.

## 2016-09-25 NOTE — ED Provider Notes (Signed)
[facility] DEPT [facility] Provider Note   CSN: [id number] Arrival date & time: 09/25/16  1149     History   Chief Complaint Chief Complaint  Patient presents with  . Knee Pain    HPI Henning Ehle is a 60 y.o. male.  Patient is a 60 year old male with past medical history of COPD, CAD with bypass surgery, gout. He presents for evaluation of left knee pain and swelling which is worsened over the past several days. He has a history of recurrent gout, however this is not responding to his usual medications. He has been treated with indomethacin. He was also seen 2 days ago in the ER and told that he likely had cellulitis. He was treated with antibiotics, however continues to worsen. He is now having fevers. He denies any chest pain or difficulty breathing. He denies any bowel or urinary complaints. His pain is worse with ambulation and states that he is unable to bear weight with the left knee.      Past Medical History:  Diagnosis Date  . COPD with chronic bronchitis ([place]) 08/26/2016  . Diabetes mellitus type 2, uncontrolled ([place]) 09/11/2016  . Gout   . Hypertension   . MI (myocardial infarction) [facility])     Patient Active Problem List   Diagnosis Date Noted  . Diabetes mellitus type 2, uncontrolled ([place]) 09/11/2016  . COPD with chronic bronchitis ([place]) 08/26/2016  . Non-ST elevation MI (NSTEMI) ([place]) 07/08/2016  . Kidney stone on right side 10/25/2015  . CKD (chronic kidney disease) 10/13/2015  . Gout 10/13/2015  . Accelerated hypertension 10/13/2015  . Abnormal EKG 10/13/2015  . CAD Status post CABG 2 SVG to OM 1, LIMA to mid LAD: 10/13/2015  . Cardiomyopathy, ischemic, chronic systolic CHF 28/41/3244  . Acute pericarditis 05/05/2015  . Essential hypertension 09/04/2014  . Benign prostatic hypertrophy with lower urinary tract symptoms (LUTS) 01/24/2013  . Peyronie's disease 12/18/2012  . Chronic joint pain 06/22/2012  . Congestive heart failure ([place]) 01/26/2012  .  History of tobacco abuse 01/26/2012  . Inguinal hernia, right 01/26/2012  . Lipid disorder 01/26/2012    Past Surgical History:  Procedure Laterality Date  . CORONARY ARTERY BYPASS GRAFT    . KNEE SURGERY    . ROTATOR CUFF REPAIR    . WRIST SURGERY         Home Medications    Prior to Admission medications   Medication Sig Start Date End Date Taking? Authorizing Provider  albuterol (PROVENTIL) (2.5 MG/3ML) 0.083% nebulizer solution Take 3 mLs (2.5 mg total) by nebulization every 6 (six) hours as needed for wheezing or shortness of breath. 08/26/16   Shelda Pal, DO  allopurinol (ZYLOPRIM) 100 MG tablet Take 2 tablets (200 mg total) by mouth daily. Take 1 tab daily for 1st week. 09/11/16   Shelda Pal, DO  atorvastatin (LIPITOR) 80 MG tablet Take 1 tablet (80 mg total) by mouth daily. 08/26/16   Shelda Pal, DO  carvedilol (COREG) 6.25 MG tablet Take 6.25 mg by mouth 2 (two) times daily. 10/25/15   Historical Provider, MD  cephALEXin (KEFLEX) 500 MG capsule Take 1 capsule (500 mg total) by mouth 2 (two) times daily. 09/23/16   Everlene Balls, MD  colchicine 0.6 MG tablet Take 1 tablet (0.6 mg total) by mouth daily. Take 2 tabs initially followed by 1 tab 1 hr later. 09/11/16   Shelda Pal, DO  Diclofenac Sodium CR (VOLTAREN-XR) 100 MG 24 hr tablet Take 1 tablet (100  mg total) by mouth daily. Patient taking differently: Take 100 mg by mouth daily as needed.  02/14/16   [name], MD  doxycycline (VIBRAMYCIN) 100 MG capsule Take 1 capsule (100 mg total) by mouth 2 (two) times daily. One po bid x 7 days 09/23/16   Everlene Balls, MD  famotidine (PEPCID) 20 MG tablet Take 1 tablet (20 mg total) by mouth 2 (two) times daily. 04/29/16   Dorie Rank, MD  furosemide (LASIX) 80 MG tablet Take 60 mg by mouth daily.  10/25/15   Historical Provider, MD  glucose blood test strip Use as instructed 08/26/16   Crosby Oyster Wendling, DO  isosorbide mononitrate (IMDUR) 60  MG 24 hr tablet Take 1 tablet by mouth daily. 08/13/16   Historical Provider, MD  Lancets (FREESTYLE) lancets Use to check sugars daily 08/26/16   Crosby Oyster Wendling, DO  lisinopril (PRINIVIL,ZESTRIL) 40 MG tablet Take 40 mg by mouth daily. 03/27/16   Historical Provider, MD  metFORMIN (GLUCOPHAGE) 1000 MG tablet Take 1,000 mg by mouth 2 (two) times daily. 10/25/15   Historical Provider, MD  methocarbamol (ROBAXIN) 500 MG tablet Take 2 tablets (1,000 mg total) by mouth every 8 (eight) hours as needed for muscle spasms. 04/29/16   Dorie Rank, MD  Nebulizers (AEROECLIPSE II NEBULIZER) MISC Use to administer albuterol every 6 hours as needed. 08/26/16   Crosby Oyster Wendling, DO  NIFEdipine (PROCARDIA-XL/ADALAT CC) 60 MG 24 hr tablet Take 1 tablet (60 mg total) by mouth daily. 08/26/16   Shelda Pal, DO  oxyCODONE (ROXICODONE) 5 MG immediate release tablet Take 1 tablet (5 mg total) by mouth 2 (two) times daily as needed for severe pain. 09/23/16   Everlene Balls, MD  traMADol (ULTRAM) 50 MG tablet Take 1 tablet (50 mg total) by mouth every 6 (six) hours as needed for severe pain. 09/05/16   Waynetta Pean, PA-C    Family History Family History  Problem Relation Age of Onset  . Hypertension Mother   . Diabetes Mother   . Hyperlipidemia Mother   . Kidney failure Mother   . Emphysema Father     Social History Social History  Substance Use Topics  . Smoking status: Former Smoker    Packs/day: 0.25    Quit date: 07/10/2016  . Smokeless tobacco: Never Used  . Alcohol use No     Allergies   Hydrocodone and Tramadol   Review of Systems Review of Systems  All other systems reviewed and are negative.    Physical Exam Updated Vital Signs BP 169/98   Pulse 96   Temp 100.8 F (38.2 C)   Resp 17   Ht 5\' 10"  (1.778 m)   Wt 193 lb (87.5 kg)   SpO2 96%   BMI 27.69 kg/m   Physical Exam  Constitutional: He is oriented to person, place, and time. He appears well-developed and  well-nourished. No distress.  HENT:  Head: Normocephalic and atraumatic.  Mouth/Throat: Oropharynx is clear and moist.  Neck: Normal range of motion. Neck supple.  Cardiovascular: Normal rate and regular rhythm.  Exam reveals no friction rub.   No murmur heard. Pulmonary/Chest: Effort normal and breath sounds normal. No respiratory distress. He has no wheezes. He has no rales.  Abdominal: Soft. Bowel sounds are normal. He exhibits no distension. There is no tenderness.  Musculoskeletal: Normal range of motion. He exhibits no edema.  The left knee has a moderate sized effusion. There is warmth over the knee, however no overlying cellulitis  or erythema. He has pain with any range of motion. Distal PMS is intact.  Neurological: He is alert and oriented to person, place, and time. Coordination normal.  Skin: Skin is warm and dry. He is not diaphoretic.  Nursing note and vitals reviewed.    ED Treatments / Results  Labs (all labs ordered are listed, but only abnormal results are displayed) Labs Reviewed  COMPREHENSIVE METABOLIC PANEL - Abnormal; Notable for the following:       Result Value   CO2 21 (*)    Glucose, Bld 128 (*)    Creatinine, Ser 1.65 (*)    Total Protein 8.8 (*)    ALT 10 (*)    GFR calc non Af Amer 43 (*)    GFR calc Af Amer 50 (*)    All other components within normal limits  CBC WITH DIFFERENTIAL/PLATELET - Abnormal; Notable for the following:    RBC 4.21 (*)    HCT 38.6 (*)    Monocytes Absolute 1.2 (*)    All other components within normal limits  I-STAT CG4 LACTIC ACID, ED - Abnormal; Notable for the following:    Lactic Acid, Venous 2.36 (*)    All other components within normal limits  CULTURE, BLOOD (ROUTINE X 2)  CULTURE, BLOOD (ROUTINE X 2)  URINE CULTURE  GRAM STAIN  BODY FLUID CULTURE  URINALYSIS, ROUTINE W REFLEX MICROSCOPIC (NOT AT [facility])  LACTIC ACID, PLASMA  LACTIC ACID, PLASMA  SEDIMENTATION RATE  C-REACTIVE PROTEIN  CSF CELL COUNT WITH  DIFFERENTIAL  SYNOVIAL FLUID, CRYSTAL    EKG  EKG Interpretation None       Radiology No results found.  Procedures .Joint Aspiration/Arthrocentesis Date/Time: 09/25/2016 12:38 PM Performed by: Veryl Speak Authorized by: Veryl Speak   Consent:    Consent obtained:  Verbal   Consent given by:  Patient   Risks discussed:  Infection and pain   Alternatives discussed:  No treatment Location:    Location:  Knee   Knee:  L knee Anesthesia (see MAR for exact dosages):    Anesthesia method:  Local infiltration   Local anesthetic:  Lidocaine 1% w/o epi Procedure details:    Preparation: Patient was prepped and draped in usual sterile fashion     Needle gauge:  18 G   Ultrasound guidance: no     Approach:  Medial   Aspirate characteristics:  Yellow and cloudy   Steroid injected: no     Specimen collected: yes   Post-procedure details:    Dressing:  Adhesive bandage   Patient tolerance of procedure:  Tolerated well, no immediate complications   (including critical care time)  Medications Ordered in ED Medications  lidocaine (PF) (XYLOCAINE) 1 % injection 5 mL (5 mLs Intradermal Given 09/25/16 1235)     Initial Impression / Assessment and Plan / ED Course  I have reviewed the triage vital signs and the nursing notes.  Pertinent labs & imaging results that were available during my care of the patient were reviewed by me and considered in my medical decision making (see chart for details).  Clinical Course    Patient sent from the upstairs clinic for evaluation of knee pain and swelling. He arrived here febrile. He has a history of gout, however there was concern about a possible septic joint. An arthrocentesis was performed which revealed approximately 20 mL of yellow, cloudy fluid area this was sent to the lab for analysis. This needed to be taken to [facility], so there  was a delay in the results. Care will be signed out to Dr. Billy Fischer at shift change awaiting these  results. She will determine the final disposition.  Final Clinical Impressions(s) / ED Diagnoses   Final diagnoses:  None    New Prescriptions New Prescriptions   No medications on file     Veryl Speak, MD 09/26/16 [phone]

## 2016-09-26 LAB — URINE CULTURE: Culture: NO GROWTH

## 2016-09-26 LAB — MISC LABCORP TEST (SEND OUT): LABCORP TEST CODE: 19497

## 2016-09-26 LAB — GRAM STAIN

## 2016-09-26 NOTE — ED Provider Notes (Signed)
Received care from Dr. Stark Jock. Briefly this is a 60 yo male with history of DM, CAD, gout, who presents with knee pain and fever to 102.  He has multiple recent visits to ED for knee pain, most recently 2 days ago and was initiated on abx for cellulitis with keflex and doxycycline.    Today, he does not have signs of erythema, however pain and effusion of knee and Dr. Stark Jock performed arthrocentesis with 20cc of cloudy, although not grossly purulent fluid.  Awaiting synovial fluid analysis at this time with disposition pending results.  Synovial fluid showing WBC 65500, with intracellular and extracellular crystals, no sign of organisms on gram stain.  Called Dr. Ninfa Linden of Orthopedics given clinical picture.  Based on current synovial fluid analysis, suspect gout, and reports can have him follow up Monday AM in clinic or if we would like we may transfer him to [facility].  At this time, discussed with patient that we are awaiting cultures, and while his synovial fluid analysis is more suggestive of gout, we will await culture results for more information.  He is hemodynamically stable, well appearing. His urinalysis is questionable also for infection and culture is pending. Will have patient continue abx as prescribed 2 days ago and have him follow up closely with Dr. Ninfa Linden Monday, await cultures and return if any worsening of symptoms. Patient states understanding of plan. Patient discharged in stable condition with understanding of reasons to return.    Gareth Morgan, MD 09/26/16 1155

## 2016-09-29 ENCOUNTER — Inpatient Hospital Stay (HOSPITAL_COMMUNITY)
Admission: EM | Admit: 2016-09-29 | Discharge: 2016-10-02 | DRG: 554 | Disposition: A | Discharge status: Home / Self Care | Payer: Medicare Other | Attending: Internal Medicine | Admitting: Internal Medicine

## 2016-09-29 ENCOUNTER — Encounter (HOSPITAL_COMMUNITY): Payer: Self-pay

## 2016-09-29 ENCOUNTER — Observation Stay (HOSPITAL_COMMUNITY): Payer: Medicare Other

## 2016-09-29 ENCOUNTER — Emergency Department (HOSPITAL_COMMUNITY): Payer: Medicare Other

## 2016-09-29 DIAGNOSIS — J449 Chronic obstructive pulmonary disease, unspecified: Secondary | ICD-10-CM | POA: Diagnosis not present

## 2016-09-29 DIAGNOSIS — Z8249 Family history of ischemic heart disease and other diseases of the circulatory system: Secondary | ICD-10-CM

## 2016-09-29 DIAGNOSIS — Z951 Presence of aortocoronary bypass graft: Secondary | ICD-10-CM | POA: Diagnosis not present

## 2016-09-29 DIAGNOSIS — N183 Chronic kidney disease, stage 3 unspecified: Secondary | ICD-10-CM | POA: Diagnosis present

## 2016-09-29 DIAGNOSIS — A419 Sepsis, unspecified organism: Secondary | ICD-10-CM | POA: Diagnosis not present

## 2016-09-29 DIAGNOSIS — R000 Tachycardia, unspecified: Secondary | ICD-10-CM | POA: Diagnosis present

## 2016-09-29 DIAGNOSIS — R011 Cardiac murmur, unspecified: Secondary | ICD-10-CM | POA: Diagnosis present

## 2016-09-29 DIAGNOSIS — I255 Ischemic cardiomyopathy: Secondary | ICD-10-CM | POA: Diagnosis not present

## 2016-09-29 DIAGNOSIS — E789 Disorder of lipoprotein metabolism, unspecified: Secondary | ICD-10-CM | POA: Diagnosis present

## 2016-09-29 DIAGNOSIS — R0682 Tachypnea, not elsewhere classified: Secondary | ICD-10-CM | POA: Diagnosis present

## 2016-09-29 DIAGNOSIS — M25562 Pain in left knee: Secondary | ICD-10-CM | POA: Diagnosis not present

## 2016-09-29 DIAGNOSIS — Z7984 Long term (current) use of oral hypoglycemic drugs: Secondary | ICD-10-CM

## 2016-09-29 DIAGNOSIS — E1121 Type 2 diabetes mellitus with diabetic nephropathy: Secondary | ICD-10-CM | POA: Diagnosis not present

## 2016-09-29 DIAGNOSIS — I10 Essential (primary) hypertension: Secondary | ICD-10-CM | POA: Diagnosis not present

## 2016-09-29 DIAGNOSIS — R262 Difficulty in walking, not elsewhere classified: Secondary | ICD-10-CM

## 2016-09-29 DIAGNOSIS — Z825 Family history of asthma and other chronic lower respiratory diseases: Secondary | ICD-10-CM

## 2016-09-29 DIAGNOSIS — Z833 Family history of diabetes mellitus: Secondary | ICD-10-CM

## 2016-09-29 DIAGNOSIS — M10062 Idiopathic gout, left knee: Secondary | ICD-10-CM | POA: Diagnosis not present

## 2016-09-29 DIAGNOSIS — M109 Gout, unspecified: Secondary | ICD-10-CM | POA: Diagnosis present

## 2016-09-29 DIAGNOSIS — R6510 Systemic inflammatory response syndrome (SIRS) of non-infectious origin without acute organ dysfunction: Secondary | ICD-10-CM | POA: Diagnosis present

## 2016-09-29 DIAGNOSIS — I130 Hypertensive heart and chronic kidney disease with heart failure and stage 1 through stage 4 chronic kidney disease, or unspecified chronic kidney disease: Secondary | ICD-10-CM | POA: Diagnosis not present

## 2016-09-29 DIAGNOSIS — M7989 Other specified soft tissue disorders: Secondary | ICD-10-CM | POA: Diagnosis not present

## 2016-09-29 DIAGNOSIS — Z79899 Other long term (current) drug therapy: Secondary | ICD-10-CM

## 2016-09-29 DIAGNOSIS — M1A0620 Idiopathic chronic gout, left knee, without tophus (tophi): Secondary | ICD-10-CM | POA: Diagnosis not present

## 2016-09-29 DIAGNOSIS — R0602 Shortness of breath: Secondary | ICD-10-CM

## 2016-09-29 DIAGNOSIS — Z885 Allergy status to narcotic agent status: Secondary | ICD-10-CM

## 2016-09-29 DIAGNOSIS — Z87891 Personal history of nicotine dependence: Secondary | ICD-10-CM

## 2016-09-29 DIAGNOSIS — I5022 Chronic systolic (congestive) heart failure: Secondary | ICD-10-CM | POA: Diagnosis present

## 2016-09-29 DIAGNOSIS — I25119 Atherosclerotic heart disease of native coronary artery with unspecified angina pectoris: Secondary | ICD-10-CM | POA: Diagnosis present

## 2016-09-29 DIAGNOSIS — Z888 Allergy status to other drugs, medicaments and biological substances status: Secondary | ICD-10-CM

## 2016-09-29 DIAGNOSIS — R030 Elevated blood-pressure reading, without diagnosis of hypertension: Secondary | ICD-10-CM | POA: Diagnosis not present

## 2016-09-29 DIAGNOSIS — E785 Hyperlipidemia, unspecified: Secondary | ICD-10-CM | POA: Diagnosis present

## 2016-09-29 DIAGNOSIS — J4489 Other specified chronic obstructive pulmonary disease: Secondary | ICD-10-CM | POA: Diagnosis present

## 2016-09-29 DIAGNOSIS — Z841 Family history of disorders of kidney and ureter: Secondary | ICD-10-CM

## 2016-09-29 DIAGNOSIS — E1165 Type 2 diabetes mellitus with hyperglycemia: Secondary | ICD-10-CM | POA: Diagnosis present

## 2016-09-29 DIAGNOSIS — IMO0002 Reserved for concepts with insufficient information to code with codable children: Secondary | ICD-10-CM | POA: Diagnosis present

## 2016-09-29 DIAGNOSIS — E1122 Type 2 diabetes mellitus with diabetic chronic kidney disease: Secondary | ICD-10-CM | POA: Diagnosis present

## 2016-09-29 HISTORY — DX: Atherosclerotic heart disease of native coronary artery without angina pectoris: I25.10

## 2016-09-29 LAB — CBC WITH DIFFERENTIAL/PLATELET
BASOS PCT: 0 %
Basophils Absolute: 0 10*3/uL (ref 0.0–0.1)
EOS ABS: 0 10*3/uL (ref 0.0–0.7)
Eosinophils Relative: 0 %
HCT: 33.9 % — ABNORMAL LOW (ref 39.0–52.0)
HEMOGLOBIN: 11.5 g/dL — AB (ref 13.0–17.0)
LYMPHS ABS: 1 10*3/uL (ref 0.7–4.0)
Lymphocytes Relative: 13 %
MCH: 30.1 pg (ref 26.0–34.0)
MCHC: 33.9 g/dL (ref 30.0–36.0)
MCV: 88.7 fL (ref 78.0–100.0)
Monocytes Absolute: 0.9 10*3/uL (ref 0.1–1.0)
Monocytes Relative: 12 %
NEUTROS ABS: 5.9 10*3/uL (ref 1.7–7.7)
NEUTROS PCT: 75 %
Platelets: 259 10*3/uL (ref 150–400)
RBC: 3.82 MIL/uL — AB (ref 4.22–5.81)
RDW: 13 % (ref 11.5–15.5)
WBC: 7.9 10*3/uL (ref 4.0–10.5)

## 2016-09-29 LAB — BASIC METABOLIC PANEL
Anion gap: 11 (ref 5–15)
BUN: 15 mg/dL (ref 6–20)
CHLORIDE: 104 mmol/L (ref 101–111)
CO2: 21 mmol/L — ABNORMAL LOW (ref 22–32)
Calcium: 8.9 mg/dL (ref 8.9–10.3)
Creatinine, Ser: 1.29 mg/dL — ABNORMAL HIGH (ref 0.61–1.24)
GFR calc non Af Amer: 58 mL/min — ABNORMAL LOW (ref >60)
Glucose, Bld: 169 mg/dL — ABNORMAL HIGH (ref 65–99)
POTASSIUM: 4 mmol/L (ref 3.5–5.1)
SODIUM: 136 mmol/L (ref 135–145)

## 2016-09-29 LAB — URIC ACID: URIC ACID, SERUM: 7.8 mg/dL — AB (ref 4.4–7.6)

## 2016-09-29 MED ORDER — LORAZEPAM 2 MG/ML IJ SOLN
1.0000 mg | Freq: Once | INTRAMUSCULAR | Status: AC
  Administered 2016-09-29: 1 mg via INTRAVENOUS
  Filled 2016-09-29: qty 1

## 2016-09-29 MED ORDER — HYDROMORPHONE HCL 1 MG/ML IJ SOLN
1.0000 mg | Freq: Once | INTRAMUSCULAR | Status: AC
  Administered 2016-09-29: 1 mg via INTRAVENOUS
  Filled 2016-09-29: qty 1

## 2016-09-29 MED ORDER — METHYLPREDNISOLONE SODIUM SUCC 125 MG IJ SOLR
125.0000 mg | Freq: Once | INTRAMUSCULAR | Status: AC
  Administered 2016-09-29: 125 mg via INTRAVENOUS
  Filled 2016-09-29: qty 2

## 2016-09-29 MED ORDER — SODIUM CHLORIDE 0.9 % IV SOLN
INTRAVENOUS | Status: DC
  Administered 2016-09-29 – 2016-10-02 (×5): via INTRAVENOUS

## 2016-09-29 NOTE — ED Provider Notes (Signed)
[facility] DEPT Provider Note   CSN: [id number] Arrival date & time: 09/29/16  1440     History   Chief Complaint Chief Complaint  Patient presents with  . Knee Pain    HPI Patrick Stewart is a 60 y.o. male.  60 year old male presents with ongoing left knee pain 4 days. All records were reviewed and patient was diagnosed with gout. He did have an arthrocentesis and those culture reports today show no growth. The patient's Gram stain is consistent with gout. Has been placed on medications at home which aren't helping. Denies recent fever or chills. Denies any hip pain. Pain characterized as sharp and worse with standing and nothing makes it better. Not responsive to his home narcotics.      Past Medical History:  Diagnosis Date  . COPD with chronic bronchitis ([place]) 08/26/2016  . Diabetes mellitus type 2, uncontrolled ([place]) 09/11/2016  . Gout   . Hypertension   . MI (myocardial infarction)     Patient Active Problem List   Diagnosis Date Noted  . Diabetes mellitus type 2, uncontrolled ([place]) 09/11/2016  . COPD with chronic bronchitis ([place]) 08/26/2016  . Non-ST elevation MI (NSTEMI) ([place]) 07/08/2016  . Kidney stone on right side 10/25/2015  . CKD (chronic kidney disease) 10/13/2015  . Gout 10/13/2015  . Accelerated hypertension 10/13/2015  . Abnormal EKG 10/13/2015  . CAD Status post CABG 2 SVG to OM 1, LIMA to mid LAD: 10/13/2015  . Cardiomyopathy, ischemic, chronic systolic CHF 54/27/0623  . Acute pericarditis 05/05/2015  . Essential hypertension 09/04/2014  . Benign prostatic hypertrophy with lower urinary tract symptoms (LUTS) 01/24/2013  . Peyronie's disease 12/18/2012  . Chronic joint pain 06/22/2012  . Congestive heart failure ([place]) 01/26/2012  . History of tobacco abuse 01/26/2012  . Inguinal hernia, right 01/26/2012  . Lipid disorder 01/26/2012    Past Surgical History:  Procedure Laterality Date  . CORONARY ARTERY BYPASS GRAFT    . KNEE SURGERY    .  ROTATOR CUFF REPAIR    . WRIST SURGERY         Home Medications    Prior to Admission medications   Medication Sig Start Date End Date Taking? Authorizing Provider  albuterol (PROVENTIL) (2.5 MG/3ML) 0.083% nebulizer solution Take 3 mLs (2.5 mg total) by nebulization every 6 (six) hours as needed for wheezing or shortness of breath. 08/26/16  Yes Crosby Oyster Wendling, DO  allopurinol (ZYLOPRIM) 100 MG tablet Take 2 tablets (200 mg total) by mouth daily. Take 1 tab daily for 1st week. Patient taking differently: Take 100 mg by mouth 2 (two) times daily.  09/11/16  Yes Crosby Oyster Wendling, DO  atorvastatin (LIPITOR) 80 MG tablet Take 1 tablet (80 mg total) by mouth daily. Patient taking differently: Take 80 mg by mouth every morning.  08/26/16  Yes Crosby Oyster Wendling, DO  carvedilol (COREG) 6.25 MG tablet Take 6.25 mg by mouth 2 (two) times daily. 10/25/15  Yes Historical Provider, MD  colchicine 0.6 MG tablet Take 1 tablet (0.6 mg total) by mouth daily. Take 2 tabs initially followed by 1 tab 1 hr later. Patient taking differently: Take 0.6 mg by mouth daily as needed. Take 2 tabs initially followed by 1 tab 1 hr later. 09/11/16  Yes Crosby Oyster Wendling, DO  famotidine (PEPCID) 20 MG tablet Take 1 tablet (20 mg total) by mouth 2 (two) times daily. Patient taking differently: Take 20 mg by mouth 2 (two) times daily as needed for heartburn or indigestion.  04/29/16  Yes Dorie Rank, MD  furosemide (LASIX) 20 MG tablet Take 60 mg by mouth every morning.   Yes Historical Provider, MD  glucose blood test strip Use as instructed 08/26/16  Yes Crosby Oyster [place], DO  Lancets (FREESTYLE) lancets Use to check sugars daily 08/26/16  Yes Crosby Oyster Wendling, DO  lisinopril (PRINIVIL,ZESTRIL) 40 MG tablet Take 40 mg by mouth every morning.  03/27/16  Yes Historical Provider, MD  metFORMIN (GLUCOPHAGE) 1000 MG tablet Take 1,000 mg by mouth 2 (two) times daily. 10/25/15  Yes Historical Provider, MD   NIFEdipine (PROCARDIA-XL/ADALAT CC) 60 MG 24 hr tablet Take 1 tablet (60 mg total) by mouth daily. Patient taking differently: Take 60 mg by mouth every morning.  08/26/16  Yes Crosby Oyster Wendling, DO  oxyCODONE-acetaminophen (PERCOCET/ROXICET) 5-325 MG tablet Take 2 tablets by mouth every 6 (six) hours as needed for severe pain. 09/25/16 10/02/16 Yes Gareth Morgan, MD  cephALEXin (KEFLEX) 500 MG capsule Take 1 capsule (500 mg total) by mouth 2 (two) times daily. Patient not taking: Reported on 09/29/2016 09/23/16   Everlene Balls, MD  Diclofenac Sodium CR (VOLTAREN-XR) 100 MG 24 hr tablet Take 1 tablet (100 mg total) by mouth daily. Patient not taking: Reported on 09/29/2016 02/14/16   [name], MD  doxycycline (VIBRAMYCIN) 100 MG capsule Take 1 capsule (100 mg total) by mouth 2 (two) times daily. One po bid x 7 days Patient not taking: Reported on 09/29/2016 09/23/16   Everlene Balls, MD  methocarbamol (ROBAXIN) 500 MG tablet Take 2 tablets (1,000 mg total) by mouth every 8 (eight) hours as needed for muscle spasms. Patient not taking: Reported on 09/29/2016 04/29/16   Dorie Rank, MD  Nebulizers (AEROECLIPSE II NEBULIZER) MISC Use to administer albuterol every 6 hours as needed. Patient not taking: Reported on 09/29/2016 08/26/16   Shelda Pal, DO  oxyCODONE (ROXICODONE) 5 MG immediate release tablet Take 1 tablet (5 mg total) by mouth 2 (two) times daily as needed for severe pain. Patient not taking: Reported on 09/29/2016 09/23/16   Everlene Balls, MD  traMADol (ULTRAM) 50 MG tablet Take 1 tablet (50 mg total) by mouth every 6 (six) hours as needed for severe pain. Patient not taking: Reported on 09/29/2016 09/05/16   Waynetta Pean, PA-C    Family History Family History  Problem Relation Age of Onset  . Hypertension Mother   . Diabetes Mother   . Hyperlipidemia Mother   . Kidney failure Mother   . Emphysema Father     Social History Social History  Substance Use Topics  . Smoking  status: Former Smoker    Packs/day: 0.25    Quit date: 07/10/2016  . Smokeless tobacco: Never Used  . Alcohol use No     Allergies   Hydrocodone and Tramadol   Review of Systems Review of Systems  All other systems reviewed and are negative.    Physical Exam Updated Vital Signs BP (!) 179/104   Pulse 117   Temp 98.5 F (36.9 C)   Resp 19   SpO2 99%   Physical Exam  Constitutional: He is oriented to person, place, and time. He appears well-developed and well-nourished.  Non-toxic appearance. No distress.  HENT:  Head: Normocephalic and atraumatic.  Eyes: Conjunctivae, EOM and lids are normal. Pupils are equal, round, and reactive to light.  Neck: Normal range of motion. Neck supple. No tracheal deviation present. No thyroid mass present.  Cardiovascular: Normal rate, regular rhythm and normal heart sounds.  Exam reveals no gallop.   No murmur heard. Pulmonary/Chest: Effort normal and breath sounds normal. No stridor. No respiratory distress. He has no decreased breath sounds. He has no wheezes. He has no rhonchi. He has no rales.  Abdominal: Soft. Normal appearance and bowel sounds are normal. He exhibits no distension. There is no tenderness. There is no rebound and no CVA tenderness.  Musculoskeletal: He exhibits no edema.       Left knee: He exhibits decreased range of motion and effusion. Tenderness found.  Neurological: He is alert and oriented to person, place, and time. He has normal strength. No cranial nerve deficit or sensory deficit. GCS eye subscore is 4. GCS verbal subscore is 5. GCS motor subscore is 6.  Skin: Skin is warm and dry. No abrasion and no rash noted.  Psychiatric: He has a normal mood and affect. His speech is normal and behavior is normal.  Nursing note and vitals reviewed.    ED Treatments / Results  Labs (all labs ordered are listed, but only abnormal results are displayed) Labs Reviewed  CBC WITH DIFFERENTIAL/PLATELET  BASIC METABOLIC  PANEL  URIC ACID    EKG  EKG Interpretation None       Radiology No results found.  Procedures Procedures (including critical care time)  Medications Ordered in ED Medications  0.9 %  sodium chloride infusion (not administered)  methylPREDNISolone sodium succinate (SOLU-MEDROL) 125 mg/2 mL injection 125 mg (not administered)  LORazepam (ATIVAN) injection 1 mg (not administered)  HYDROmorphone (DILAUDID) injection 1 mg (not administered)     Initial Impression / Assessment and Plan / ED Course  I have reviewed the triage vital signs and the nursing notes.  Pertinent labs & imaging results that were available during my care of the patient were reviewed by me and considered in my medical decision making (see chart for details).  Clinical Course    Patient medicated here for his acute gout flare. Continues to be uncomfortable and with tachycardia. Will admit for pain management  Final Clinical Impressions(s) / ED Diagnoses   Final diagnoses:  None    New Prescriptions New Prescriptions   No medications on file     Lacretia Leigh, MD 09/29/16 2234

## 2016-09-29 NOTE — ED Triage Notes (Signed)
Per EMS, Pt, from [facility], c/o chronic L knee pain x 2 weeks.  Pain score 10/10.  Pt has been seen recently for same and diagnosed w/ gout.  Pt reports he has been taking oxycodone w/o relief.

## 2016-09-29 NOTE — H&P (Signed)
History and Physical    Patrick Stewart EYC:[id number] DOB: [birth year] DOA: 09/29/2016  PCP: Shelda Pal, DO Consultants:  Cardiology - [facility] Patient coming from: lives with wife  Chief Complaint: left knee pain  HPI: Patrick Stewart is a 60 y.o. male with medical history significant of CAD s/o CABG, DM, HTN, COPD and gout presenting with knee pain for about 3 weeks.  Comes and goes.  Got severe over the last week.  Left knee pain.  Feels "like it ain't nothing down there."  A little bit numb.  Pain up to 9/10 at its worst, never goes away completely.  At its best, 5-6/10.  Has been having gout attacks since he was young.  Nothing makes the pain better, "the inflammation what's making it swell.  Quite nice if they give me oxycodone but it's not doing what it's supposed to."  Pain gets better but then comes back.  Was scheduled to see Dr. Ninfa Linden for this problem on Monday but "I couldn't get up to him" because he couldn't walk.    While I was in the room, I noticed that the patient was diaphoretic and warm to the touch.  He did acknowledge fevers to 101.8 at home but specifically denies URI symptoms, cough, GI symptoms, urinary symptoms.  This was also a change from when the nurse and Dr. Zenia Resides had seen him prior.  His temp was 101.9 at that time, with ongoing tachycardia to 120-130 and tachypnea to 30s.   ED Course:  Per Dr. Zenia Resides: Patient medicated here for his acute gout flare. Continues to be uncomfortable and with tachycardia. Will admit for pain management  Review of Systems: As per HPI; otherwise 10 point review of systems reviewed and negative.   Ambulatory Status:  ambuilates without assistance when not having gout  Past Medical History:  Diagnosis Date  . CAD (coronary artery disease) 2010   3v CABG  . COPD with chronic bronchitis ([place]) 08/26/2016  . Diabetes mellitus type 2, uncontrolled ([place]) 09/11/2016  . Gout   . Hypertension     Past Surgical History:    Procedure Laterality Date  . CORONARY ARTERY BYPASS GRAFT  2010  . KNEE SURGERY    . ROTATOR CUFF REPAIR    . WRIST SURGERY      Social History   Social History  . Marital status: Married    Spouse name: N/A  . Number of children: N/A  . Years of education: N/A   Occupational History  . disabled    Social History Main Topics  . Smoking status: Former Smoker    Packs/day: 0.50    Years: 30.00    Quit date: 07/10/2016  . Smokeless tobacco: Never Used  . Alcohol use No  . Drug use: No  . Sexual activity: Yes   Other Topics Concern  . Not on file   Social History Narrative  . No narrative on file    Allergies  Allergen Reactions  . Hydrocodone Itching  . Tramadol Nausea Only    Family History  Problem Relation Age of Onset  . Hypertension Mother   . Diabetes Mother   . Hyperlipidemia Mother   . Kidney failure Mother   . Emphysema Father     Prior to Admission medications   Medication Sig Start Date End Date Taking? Authorizing Provider  albuterol (PROVENTIL) (2.5 MG/3ML) 0.083% nebulizer solution Take 3 mLs (2.5 mg total) by nebulization every 6 (six) hours as needed for wheezing or shortness of  breath. 08/26/16  Yes Crosby Oyster [place], DO  allopurinol (ZYLOPRIM) 100 MG tablet Take 2 tablets (200 mg total) by mouth daily. Take 1 tab daily for 1st week. Patient taking differently: Take 100 mg by mouth 2 (two) times daily.  09/11/16  Yes Crosby Oyster Wendling, DO  atorvastatin (LIPITOR) 80 MG tablet Take 1 tablet (80 mg total) by mouth daily. Patient taking differently: Take 80 mg by mouth every morning.  08/26/16  Yes Crosby Oyster Wendling, DO  carvedilol (COREG) 6.25 MG tablet Take 6.25 mg by mouth 2 (two) times daily. 10/25/15  Yes Historical Provider, MD  colchicine 0.6 MG tablet Take 1 tablet (0.6 mg total) by mouth daily. Take 2 tabs initially followed by 1 tab 1 hr later. Patient taking differently: Take 0.6 mg by mouth daily as needed. Take 2 tabs  initially followed by 1 tab 1 hr later. 09/11/16  Yes Crosby Oyster Wendling, DO  famotidine (PEPCID) 20 MG tablet Take 1 tablet (20 mg total) by mouth 2 (two) times daily. Patient taking differently: Take 20 mg by mouth 2 (two) times daily as needed for heartburn or indigestion.  04/29/16  Yes Dorie Rank, MD  furosemide (LASIX) 20 MG tablet Take 60 mg by mouth every morning.   Yes Historical Provider, MD  glucose blood test strip Use as instructed 08/26/16  Yes Crosby Oyster [place], DO  Lancets (FREESTYLE) lancets Use to check sugars daily 08/26/16  Yes Crosby Oyster Wendling, DO  lisinopril (PRINIVIL,ZESTRIL) 40 MG tablet Take 40 mg by mouth every morning.  03/27/16  Yes Historical Provider, MD  metFORMIN (GLUCOPHAGE) 1000 MG tablet Take 1,000 mg by mouth 2 (two) times daily. 10/25/15  Yes Historical Provider, MD  NIFEdipine (PROCARDIA-XL/ADALAT CC) 60 MG 24 hr tablet Take 1 tablet (60 mg total) by mouth daily. Patient taking differently: Take 60 mg by mouth every morning.  08/26/16  Yes Crosby Oyster Wendling, DO  oxyCODONE-acetaminophen (PERCOCET/ROXICET) 5-325 MG tablet Take 2 tablets by mouth every 6 (six) hours as needed for severe pain. 09/25/16 10/02/16 Yes Gareth Morgan, MD  cephALEXin (KEFLEX) 500 MG capsule Take 1 capsule (500 mg total) by mouth 2 (two) times daily. Patient not taking: Reported on 09/29/2016 09/23/16   Everlene Balls, MD  Diclofenac Sodium CR (VOLTAREN-XR) 100 MG 24 hr tablet Take 1 tablet (100 mg total) by mouth daily. Patient not taking: Reported on 09/29/2016 02/14/16   [name], MD  doxycycline (VIBRAMYCIN) 100 MG capsule Take 1 capsule (100 mg total) by mouth 2 (two) times daily. One po bid x 7 days Patient not taking: Reported on 09/29/2016 09/23/16   Everlene Balls, MD  methocarbamol (ROBAXIN) 500 MG tablet Take 2 tablets (1,000 mg total) by mouth every 8 (eight) hours as needed for muscle spasms. Patient not taking: Reported on 09/29/2016 04/29/16   Dorie Rank, MD  Nebulizers  (AEROECLIPSE II NEBULIZER) MISC Use to administer albuterol every 6 hours as needed. Patient not taking: Reported on 09/29/2016 08/26/16   Shelda Pal, DO  oxyCODONE (ROXICODONE) 5 MG immediate release tablet Take 1 tablet (5 mg total) by mouth 2 (two) times daily as needed for severe pain. Patient not taking: Reported on 09/29/2016 09/23/16   Everlene Balls, MD  traMADol (ULTRAM) 50 MG tablet Take 1 tablet (50 mg total) by mouth every 6 (six) hours as needed for severe pain. Patient not taking: Reported on 09/29/2016 09/05/16   Waynetta Pean, PA-C    Physical Exam: Vitals:   09/29/16 1934 09/29/16 2201  09/30/16 0009 09/30/16 0037  BP: (!) 202/114 (!) 207/114 (!) 177/116 (!) 182/101  Pulse: (!) 125 (!) 130 110 (!) 110  Resp: 21 20  20   Temp:   99.4 F (37.4 C) (!) 100.8 F (38.2 C)  TempSrc:   Oral Oral  SpO2: 97% 100% 98%   Weight:    87.9 kg (193 lb 12.6 oz)     General: Ill-appearing, warm to touch, diaphoretic, somnolent Eyes:  PERRL, EOMI, normal lids, iris ENT:  grossly normal hearing, lips & tongue, mmm Neck:  no LAD, masses or thyromegaly Cardiovascular: Tachycardia, 0-8/6 systolic murmur, no r/g. No LE edema.  Respiratory:  CTA bilaterally, no w/r/r. Normal respiratory effort. Abdomen:  soft, ntnd, NABS Skin:  no rash or induration seen on limited exam Musculoskeletal:  grossly normal tone BUE/BLE,  no bony abnormality.  Left knee has an effusion and is warm to touch but without surrounding erythema.  Previous location of joint aspiration is on the superior lateral aspect. Psychiatric:  grossly normal mood and affect, speech fluent and appropriate, AOx3, mildly somnolent Neurologic:  CN 2-12 grossly intact, moves all extremities in coordinated fashion, sensation intact  Labs on Admission: I have personally reviewed following labs and imaging studies  CBC:  Recent Labs Lab 09/25/16 1204 09/29/16 2040  WBC 9.2 7.9  NEUTROABS 6.5 5.9  HGB 13.0 11.5*  HCT 38.6*  33.9*  MCV 91.7 88.7  PLT 196 761   Basic Metabolic Panel:  Recent Labs Lab 09/25/16 1204 09/29/16 2040  NA 135 136  K 3.5 4.0  CL 101 104  CO2 21* 21*  GLUCOSE 128* 169*  BUN 20 15  CREATININE 1.65* 1.29*  CALCIUM 9.1 8.9   GFR: Estimated Creatinine Clearance: 67.2 mL/min (by C-G formula based on SCr of 1.29 mg/dL (H)). Liver Function Tests:  Recent Labs Lab 09/25/16 1204  AST 20  ALT 10*  ALKPHOS 57  BILITOT 1.0  PROT 8.8*  ALBUMIN 3.8   No results for input(s): LIPASE, AMYLASE in the last 168 hours. No results for input(s): AMMONIA in the last 168 hours. Coagulation Profile: No results for input(s): INR, PROTIME in the last 168 hours. Cardiac Enzymes: No results for input(s): CKTOTAL, CKMB, CKMBINDEX, TROPONINI in the last 168 hours. BNP (last 3 results) No results for input(s): PROBNP in the last 8760 hours. HbA1C: No results for input(s): HGBA1C in the last 72 hours. CBG: No results for input(s): GLUCAP in the last 168 hours. Lipid Profile: No results for input(s): CHOL, HDL, LDLCALC, TRIG, CHOLHDL, LDLDIRECT in the last 72 hours. Thyroid Function Tests: No results for input(s): TSH, T4TOTAL, FREET4, T3FREE, THYROIDAB in the last 72 hours. Anemia Panel: No results for input(s): VITAMINB12, FOLATE, FERRITIN, TIBC, IRON, RETICCTPCT in the last 72 hours. Urine analysis:    Component Value Date/Time   COLORURINE AMBER (A) 09/25/2016 1352   APPEARANCEUR CLOUDY (A) 09/25/2016 1352   LABSPEC 1.027 09/25/2016 1352   PHURINE 5.5 09/25/2016 1352   GLUCOSEU NEGATIVE 09/25/2016 1352   HGBUR LARGE (A) 09/25/2016 1352   BILIRUBINUR SMALL (A) 09/25/2016 1352   KETONESUR 15 (A) 09/25/2016 1352   PROTEINUR >300 (A) 09/25/2016 1352   UROBILINOGEN 1.0 10/12/2015 1830   NITRITE NEGATIVE 09/25/2016 1352   LEUKOCYTESUR NEGATIVE 09/25/2016 1352    Creatinine Clearance: Estimated Creatinine Clearance: 67.2 mL/min (by C-G formula based on SCr of 1.29 mg/dL  (H)).  Sepsis Labs: @LABRCNTIP (procalcitonin:4,lacticidven:4) ) Recent Results (from the past 240 hour(s))  Culture, blood (Routine x 2)  Status: None (Preliminary result)   Collection Time: 09/25/16 12:04 PM  Result Value Ref Range Status   Specimen Description BLOOD LEFT FOREARM  Final   Special Requests   Final    BOTTLES DRAWN AEROBIC AND ANAEROBIC BLUE 5CC, RED 4CC   Culture   Final    NO GROWTH 4 DAYS Performed at [facility]    Report Status PENDING  Incomplete  Culture, body fluid-bottle     Status: None (Preliminary result)   Collection Time: 09/25/16 12:38 PM  Result Value Ref Range Status   Specimen Description SYNOVIAL LEFT KNEE  Final   Special Requests NONE  Final   Culture   Final    NO GROWTH 4 DAYS Performed at [facility]    Report Status PENDING  Incomplete  Gram stain     Status: None   Collection Time: 09/25/16 12:38 PM  Result Value Ref Range Status   Specimen Description SYNOVIAL LEFT KNEE  Final   Special Requests NONE  Final   Gram Stain   Final    MODERATE WBC PRESENT, PREDOMINANTLY PMN NO ORGANISMS SEEN Performed at [facility]    Report Status 09/26/2016 FINAL  Final  Urine culture     Status: None   Collection Time: 09/25/16  1:52 PM  Result Value Ref Range Status   Specimen Description URINE, CLEAN CATCH  Final   Special Requests NONE  Final   Culture NO GROWTH Performed at [facility]   Final   Report Status 09/26/2016 FINAL  Final     Radiological Exams on Admission: Dg Chest 2 View  Result Date: 09/30/2016 CLINICAL DATA:  60 year old male with shortness of breath. EXAM: CHEST  2 VIEW COMPARISON:  Chest radiograph dated 09/25/2016 FINDINGS: Two views of the chest demonstrates stable cardiomegaly. Median sternotomy wires and CABG vascular clips noted. Mild central vascular prominence may represent mild vascular congestion. No pulmonary edema. There is no focal consolidation, pleural effusion, or  pneumothorax. No acute osseous pathology. IMPRESSION: Stable cardiomegaly with possible mild congestive changes. No focal consolidation or pulmonary edema. Electronically Signed   By: Anner Crete M.D.   On: 09/30/2016 00:51   Dg Knee Complete 4 Views Left  Result Date: 09/29/2016 CLINICAL DATA:  Left knee pain and swelling for 2 weeks. EXAM: LEFT KNEE - COMPLETE 4+ VIEW COMPARISON:  None. FINDINGS: No acute fracture or dislocation. No lytic or sclerotic osseous lesion. Small joint effusion. Surgical clips in the posterior distal thigh. IMPRESSION: No acute osseous injury of the left knee. Electronically Signed   By: Kathreen Devoid   On: 09/29/2016 19:57    EKG: Not done today  Assessment/Plan Principal Problem:   Sepsis ([place]) Active Problems:   CKD (chronic kidney disease)   Gout   Cardiomyopathy, ischemic, chronic systolic CHF   Essential hypertension   Lipid disorder   COPD with chronic bronchitis ([place])   Diabetes mellitus type 2, uncontrolled ([place])   Sepsis -Initial call was for inadequately controlled gout pain with failure of outpatient management -However, the patient had decompensated somewhat by the time I saw him and was febrile with tachypnea and tachycardia at the time of my evaluation -BP has been high consistently so no septic shock -Lactate is normal -Most likely etiology of sepsis is septic arthritis of the left knee; he previously did have joint aspiration and culture is negative to date, but this doesn't preclude him from now having an infected joint -Will plan to observe  for now with blood and urine cultures pending -Broad spectrum coverage with Vanc/Zosyn as per protocol for sepsis of uncertain source - Vanc is likely to cover the septic arthritis, as this tends to be gram positive organisms as the source -Suggest consideration of orthopedics consult in AM, as patient was previously referred and did not attend his appointment  Gout -It is also possible that the  patient is having a systemic reaction to gout or even systemic gout -His uric acid level is elevated, although likely not as high as would be expected with systemic gout -Will continue Allopurinol for now, but will hold ongoing steroids (was given 125 mg Solumedrol in the ER) due to concern for an infected joint instead -Patient would likely benefit from upward titration of Allopurinol to decrease uric acid level below 7 in the future  CKD -Appears to be stable at this time  Ischemic cardiomyopathy with CHF -Currently appears to be compensated -Echo in 10/16 showed EF 30-35%; this may need repeating either as inpatient or outpatient but currently this doesn't appear to be an acute issue -However, he does have a loud murmur -He reports h/o murmur -Most recent ER notes do not report a murmur, but he does have a reported 2/6 on Dr. Irene Limbo 8/30 note -The difference may be related to a more hyperdynamic heart with fever and tachycardia -If still more prominent tomorrow, would consider Echo and of course TEE if blood cultures are positive -Will hold Lasix during acute illness but this may need to be restarted  DM -Hold Glucophage -Cover with SSI  HTN -Continue Coreg, Nifedipine, Lisinopril -BPs markedly elevated since admission, will add hydralazine prn  HLD -Continue Lipitor  COPD -Denies having COPD but does use breathing treatments at home - would benefit from education in this regard -Continue Albuterol nebs for now as well as Duonebs  Other -This patient has had repeated ER visits - this is his 18th ER visit in the last year, and today is his only admission -He reported that he was referred to orthopedics and had an appointment there on Monday but did not go because he was having difficulty walking. -This admission may have even been avoided had he followed up as scheduled. -Would suggest ongoing patient education regarding the importance of attending outpatient appointments  and following physician recommendations.   DVT prophylaxis: Lovenox  Code Status:  Full - confirmed with patient Family Communication: None present  Disposition Plan:  Home once clinically improved Consults called: Suggest ortho in AM  Admission status: Observation to telemetry, may need to change to inpatient status if not showing rapid improvement   Karmen Bongo MD [facility]  If 7PM-7AM, please contact night-coverage [email or web address] Password [email or web address]  09/30/2016, 1:20 AM

## 2016-09-30 DIAGNOSIS — N183 Chronic kidney disease, stage 3 (moderate): Secondary | ICD-10-CM

## 2016-09-30 DIAGNOSIS — M25462 Effusion, left knee: Secondary | ICD-10-CM | POA: Diagnosis not present

## 2016-09-30 DIAGNOSIS — I130 Hypertensive heart and chronic kidney disease with heart failure and stage 1 through stage 4 chronic kidney disease, or unspecified chronic kidney disease: Secondary | ICD-10-CM | POA: Diagnosis present

## 2016-09-30 DIAGNOSIS — I5022 Chronic systolic (congestive) heart failure: Secondary | ICD-10-CM | POA: Diagnosis present

## 2016-09-30 DIAGNOSIS — E785 Hyperlipidemia, unspecified: Secondary | ICD-10-CM | POA: Diagnosis present

## 2016-09-30 DIAGNOSIS — R0602 Shortness of breath: Secondary | ICD-10-CM | POA: Diagnosis not present

## 2016-09-30 DIAGNOSIS — Z841 Family history of disorders of kidney and ureter: Secondary | ICD-10-CM | POA: Diagnosis not present

## 2016-09-30 DIAGNOSIS — E789 Disorder of lipoprotein metabolism, unspecified: Secondary | ICD-10-CM

## 2016-09-30 DIAGNOSIS — R011 Cardiac murmur, unspecified: Secondary | ICD-10-CM | POA: Diagnosis present

## 2016-09-30 DIAGNOSIS — A419 Sepsis, unspecified organism: Secondary | ICD-10-CM

## 2016-09-30 DIAGNOSIS — E1122 Type 2 diabetes mellitus with diabetic chronic kidney disease: Secondary | ICD-10-CM

## 2016-09-30 DIAGNOSIS — I10 Essential (primary) hypertension: Secondary | ICD-10-CM

## 2016-09-30 DIAGNOSIS — Z951 Presence of aortocoronary bypass graft: Secondary | ICD-10-CM | POA: Diagnosis not present

## 2016-09-30 DIAGNOSIS — Z79899 Other long term (current) drug therapy: Secondary | ICD-10-CM | POA: Diagnosis not present

## 2016-09-30 DIAGNOSIS — M109 Gout, unspecified: Secondary | ICD-10-CM

## 2016-09-30 DIAGNOSIS — E1165 Type 2 diabetes mellitus with hyperglycemia: Secondary | ICD-10-CM

## 2016-09-30 DIAGNOSIS — J449 Chronic obstructive pulmonary disease, unspecified: Secondary | ICD-10-CM | POA: Diagnosis not present

## 2016-09-30 DIAGNOSIS — R000 Tachycardia, unspecified: Secondary | ICD-10-CM | POA: Diagnosis present

## 2016-09-30 DIAGNOSIS — I255 Ischemic cardiomyopathy: Secondary | ICD-10-CM

## 2016-09-30 DIAGNOSIS — M10062 Idiopathic gout, left knee: Secondary | ICD-10-CM | POA: Diagnosis not present

## 2016-09-30 DIAGNOSIS — Z87891 Personal history of nicotine dependence: Secondary | ICD-10-CM | POA: Diagnosis not present

## 2016-09-30 DIAGNOSIS — Z8249 Family history of ischemic heart disease and other diseases of the circulatory system: Secondary | ICD-10-CM | POA: Diagnosis not present

## 2016-09-30 DIAGNOSIS — Z833 Family history of diabetes mellitus: Secondary | ICD-10-CM | POA: Diagnosis not present

## 2016-09-30 DIAGNOSIS — I25119 Atherosclerotic heart disease of native coronary artery with unspecified angina pectoris: Secondary | ICD-10-CM | POA: Diagnosis present

## 2016-09-30 DIAGNOSIS — R0682 Tachypnea, not elsewhere classified: Secondary | ICD-10-CM | POA: Diagnosis present

## 2016-09-30 DIAGNOSIS — E1121 Type 2 diabetes mellitus with diabetic nephropathy: Secondary | ICD-10-CM | POA: Diagnosis present

## 2016-09-30 DIAGNOSIS — Z7984 Long term (current) use of oral hypoglycemic drugs: Secondary | ICD-10-CM | POA: Diagnosis not present

## 2016-09-30 DIAGNOSIS — Z888 Allergy status to other drugs, medicaments and biological substances status: Secondary | ICD-10-CM | POA: Diagnosis not present

## 2016-09-30 DIAGNOSIS — Z885 Allergy status to narcotic agent status: Secondary | ICD-10-CM | POA: Diagnosis not present

## 2016-09-30 DIAGNOSIS — R6510 Systemic inflammatory response syndrome (SIRS) of non-infectious origin without acute organ dysfunction: Secondary | ICD-10-CM | POA: Diagnosis present

## 2016-09-30 DIAGNOSIS — Z825 Family history of asthma and other chronic lower respiratory diseases: Secondary | ICD-10-CM | POA: Diagnosis not present

## 2016-09-30 LAB — CBC
HCT: 33.5 % — ABNORMAL LOW (ref 39.0–52.0)
HEMOGLOBIN: 11 g/dL — AB (ref 13.0–17.0)
MCH: 30.2 pg (ref 26.0–34.0)
MCHC: 32.8 g/dL (ref 30.0–36.0)
MCV: 92 fL (ref 78.0–100.0)
Platelets: 256 10*3/uL (ref 150–400)
RBC: 3.64 MIL/uL — ABNORMAL LOW (ref 4.22–5.81)
RDW: 13.2 % (ref 11.5–15.5)
WBC: 9.9 10*3/uL (ref 4.0–10.5)

## 2016-09-30 LAB — CULTURE, BLOOD (ROUTINE X 2): Culture: NO GROWTH

## 2016-09-30 LAB — PROCALCITONIN: Procalcitonin: 0.42 ng/mL

## 2016-09-30 LAB — BASIC METABOLIC PANEL
ANION GAP: 11 (ref 5–15)
BUN: 18 mg/dL (ref 6–20)
CO2: 21 mmol/L — AB (ref 22–32)
Calcium: 8.7 mg/dL — ABNORMAL LOW (ref 8.9–10.3)
Chloride: 102 mmol/L (ref 101–111)
Creatinine, Ser: 1.55 mg/dL — ABNORMAL HIGH (ref 0.61–1.24)
GFR, EST AFRICAN AMERICAN: 54 mL/min — AB (ref >60)
GFR, EST NON AFRICAN AMERICAN: 47 mL/min — AB (ref >60)
GLUCOSE: 307 mg/dL — AB (ref 65–99)
POTASSIUM: 4.3 mmol/L (ref 3.5–5.1)
Sodium: 134 mmol/L — ABNORMAL LOW (ref 135–145)

## 2016-09-30 LAB — GLUCOSE, CAPILLARY
Glucose-Capillary: 307 mg/dL — ABNORMAL HIGH (ref 65–99)
Glucose-Capillary: 279 mg/dL — ABNORMAL HIGH (ref 65–99)

## 2016-09-30 LAB — PROTIME-INR
INR: 1.24
Prothrombin Time: 15.7 seconds — ABNORMAL HIGH (ref 11.4–15.2)

## 2016-09-30 LAB — CULTURE, BODY FLUID W GRAM STAIN -BOTTLE: Culture: NO GROWTH

## 2016-09-30 LAB — URIC ACID: Uric Acid, Serum: 8.1 mg/dL — ABNORMAL HIGH (ref 4.4–7.6)

## 2016-09-30 LAB — I-STAT CG4 LACTIC ACID, ED: LACTIC ACID, VENOUS: 0.82 mmol/L (ref 0.5–1.9)

## 2016-09-30 LAB — MRSA PCR SCREENING: MRSA BY PCR: NEGATIVE

## 2016-09-30 LAB — APTT: aPTT: 43 seconds — ABNORMAL HIGH (ref 24–36)

## 2016-09-30 MED ORDER — OXYCODONE-ACETAMINOPHEN 5-325 MG PO TABS
2.0000 | ORAL_TABLET | Freq: Four times a day (QID) | ORAL | Status: DC | PRN
  Administered 2016-09-30 – 2016-10-02 (×6): 2 via ORAL
  Filled 2016-09-30 (×6): qty 2

## 2016-09-30 MED ORDER — ONDANSETRON HCL 4 MG/2ML IJ SOLN: 4.0000 mg | Freq: Four times a day (QID) | INTRAMUSCULAR | Status: DC | PRN

## 2016-09-30 MED ORDER — CARVEDILOL 6.25 MG PO TABS
6.2500 mg | ORAL_TABLET | Freq: Two times a day (BID) | ORAL | Status: DC
  Administered 2016-09-30 – 2016-10-02 (×5): 6.25 mg via ORAL
  Filled 2016-09-30 (×5): qty 1

## 2016-09-30 MED ORDER — IPRATROPIUM-ALBUTEROL 0.5-2.5 (3) MG/3ML IN SOLN
3.0000 mL | Freq: Four times a day (QID) | RESPIRATORY_TRACT | Status: DC
  Administered 2016-09-30: 3 mL via RESPIRATORY_TRACT
  Filled 2016-09-30: qty 3

## 2016-09-30 MED ORDER — ACETAMINOPHEN 650 MG RE SUPP: 650.0000 mg | Freq: Four times a day (QID) | RECTAL | Status: DC | PRN

## 2016-09-30 MED ORDER — MORPHINE SULFATE (PF) 4 MG/ML IV SOLN: 4.0000 mg | INTRAVENOUS | Status: DC | PRN

## 2016-09-30 MED ORDER — SODIUM CHLORIDE 0.9% FLUSH
3.0000 mL | Freq: Two times a day (BID) | INTRAVENOUS | Status: DC
  Administered 2016-09-30 – 2016-10-01 (×3): 3 mL via INTRAVENOUS

## 2016-09-30 MED ORDER — ONDANSETRON HCL 4 MG PO TABS: 4.0000 mg | ORAL_TABLET | Freq: Four times a day (QID) | ORAL | Status: DC | PRN

## 2016-09-30 MED ORDER — ALLOPURINOL 100 MG PO TABS
200.0000 mg | ORAL_TABLET | Freq: Every day | ORAL | Status: DC
  Administered 2016-09-30 – 2016-10-01 (×2): 200 mg via ORAL
  Filled 2016-09-30 (×2): qty 2

## 2016-09-30 MED ORDER — ENOXAPARIN SODIUM 40 MG/0.4ML ~~LOC~~ SOLN
40.0000 mg | SUBCUTANEOUS | Status: DC
  Administered 2016-09-30 – 2016-10-02 (×3): 40 mg via SUBCUTANEOUS
  Filled 2016-09-30 (×3): qty 0.4

## 2016-09-30 MED ORDER — METHOCARBAMOL 500 MG PO TABS: 1000.0000 mg | ORAL_TABLET | Freq: Three times a day (TID) | ORAL | Status: DC | PRN

## 2016-09-30 MED ORDER — FAMOTIDINE 20 MG PO TABS: 20.0000 mg | ORAL_TABLET | Freq: Two times a day (BID) | ORAL | Status: DC | PRN

## 2016-09-30 MED ORDER — VANCOMYCIN HCL IN DEXTROSE 1-5 GM/200ML-% IV SOLN
1000.0000 mg | Freq: Once | INTRAVENOUS | Status: AC
  Administered 2016-09-30: 1000 mg via INTRAVENOUS
  Filled 2016-09-30: qty 200

## 2016-09-30 MED ORDER — HYDRALAZINE HCL 20 MG/ML IJ SOLN
10.0000 mg | Freq: Four times a day (QID) | INTRAMUSCULAR | Status: DC | PRN
  Administered 2016-09-30: 10 mg via INTRAVENOUS
  Filled 2016-09-30: qty 1

## 2016-09-30 MED ORDER — ATORVASTATIN CALCIUM 40 MG PO TABS
80.0000 mg | ORAL_TABLET | Freq: Every day | ORAL | Status: DC
  Administered 2016-09-30 – 2016-10-02 (×3): 80 mg via ORAL
  Filled 2016-09-30 (×3): qty 2

## 2016-09-30 MED ORDER — VANCOMYCIN HCL IN DEXTROSE 1-5 GM/200ML-% IV SOLN
1000.0000 mg | Freq: Two times a day (BID) | INTRAVENOUS | Status: DC
  Administered 2016-09-30 (×2): 1000 mg via INTRAVENOUS
  Filled 2016-09-30 (×2): qty 200

## 2016-09-30 MED ORDER — ALBUTEROL SULFATE (2.5 MG/3ML) 0.083% IN NEBU: 2.5000 mg | INHALATION_SOLUTION | Freq: Four times a day (QID) | RESPIRATORY_TRACT | Status: DC | PRN

## 2016-09-30 MED ORDER — NIFEDIPINE ER OSMOTIC RELEASE 60 MG PO TB24
60.0000 mg | ORAL_TABLET | Freq: Every day | ORAL | Status: DC
  Administered 2016-09-30 – 2016-10-02 (×3): 60 mg via ORAL
  Filled 2016-09-30 (×4): qty 1

## 2016-09-30 MED ORDER — PIPERACILLIN-TAZOBACTAM 3.375 G IVPB 30 MIN
3.3750 g | Freq: Once | INTRAVENOUS | Status: AC
  Administered 2016-09-30: 3.375 g via INTRAVENOUS
  Filled 2016-09-30 (×2): qty 50

## 2016-09-30 MED ORDER — IPRATROPIUM-ALBUTEROL 0.5-2.5 (3) MG/3ML IN SOLN
3.0000 mL | Freq: Two times a day (BID) | RESPIRATORY_TRACT | Status: DC
  Administered 2016-09-30 – 2016-10-01 (×3): 3 mL via RESPIRATORY_TRACT
  Filled 2016-09-30 (×3): qty 3

## 2016-09-30 MED ORDER — INSULIN ASPART 100 UNIT/ML ~~LOC~~ SOLN
0.0000 [IU] | Freq: Three times a day (TID) | SUBCUTANEOUS | Status: DC
  Administered 2016-09-30: 7 [IU] via SUBCUTANEOUS
  Administered 2016-10-01: 2 [IU] via SUBCUTANEOUS
  Administered 2016-10-01: 5 [IU] via SUBCUTANEOUS
  Administered 2016-10-01: 3 [IU] via SUBCUTANEOUS
  Administered 2016-10-02 (×2): 2 [IU] via SUBCUTANEOUS

## 2016-09-30 MED ORDER — ACETAMINOPHEN 325 MG PO TABS: 650.0000 mg | ORAL_TABLET | Freq: Four times a day (QID) | ORAL | Status: DC | PRN

## 2016-09-30 MED ORDER — ALBUTEROL SULFATE (2.5 MG/3ML) 0.083% IN NEBU: 2.5000 mg | INHALATION_SOLUTION | RESPIRATORY_TRACT | Status: DC | PRN

## 2016-09-30 MED ORDER — PIPERACILLIN-TAZOBACTAM 3.375 G IVPB
3.3750 g | Freq: Three times a day (TID) | INTRAVENOUS | Status: DC
  Administered 2016-09-30 – 2016-10-01 (×4): 3.375 g via INTRAVENOUS
  Filled 2016-09-30 (×4): qty 50

## 2016-09-30 NOTE — Progress Notes (Addendum)
Inpatient Diabetes Program Recommendations  AACE/ADA: New Consensus Statement on Inpatient Glycemic Control (2015)  Target Ranges:  Prepandial:   less than 140 mg/dL      Peak postprandial:   less than 180 mg/dL (1-2 hours)      Critically ill patients:  140 - 180 mg/dL   Results for GLORIA, LAMBERTSON (MRN [id number]) as of 09/30/2016 14:09  Ref. Range 09/29/2016 20:40 09/30/2016 01:39  Glucose Latest Ref Range: 65 - 99 mg/dL 169 (H) 307 (H)    Review of Glycemic Control  Diabetes history: DM2 Outpatient Diabetes medications: Metformin 1000 mg BID Current orders for Inpatient glycemic control: None  Inpatient Diabetes Program Recommendations: Please consider Sensitive Novolog correction scale 0-9 TIDWC while patient is not taking home OHA medications.  Thank you,  Windy Carina, RN, BSN Diabetes Coordinator Inpatient Diabetes Program [phone] (Team Pager) [phone] (AP office) [phone] [facility] office) [phone] [facility] office)

## 2016-09-30 NOTE — Progress Notes (Signed)
[facility] PROGRESS NOTE  Gwynn Crossley IPJ:[id number] DOB: 12-Jun-1956 DOA: 09/29/2016 PCP: Shelda Pal, DO  Interim summary and HPI 60 y.o. male with medical history significant of CAD s/o CABG, DM, HTN, COPD and gout presenting with knee pain for about 3 weeks.  Comes and goes.  Got severe over the last week.  Left knee pain.  Feels "like it ain't nothing down there."  A little bit numb.  Pain up to 9/10 at its worst, never goes away completely.  At its best, 5-6/10.  Has been having gout attacks since he was young.  Nothing makes the pain better, "the inflammation what's making it swell.  Quite nice if they give me oxycodone but it's not doing what it's supposed to."  Pain gets better but then comes back.  Was scheduled to see Dr. Ninfa Linden for this problem on Monday but "I couldn't get up to him" because he couldn't walk.    Assessment/Plan: Sepsis -Initial call was for inadequately controlled gout pain with failure of outpatient management -However, the patient had decompensated somewhat by the time he was evaluated by my colleague; he was febrile, with tachypnea and tachycardia. -BP has been high consistently so no septic shock or severe sepsis present  -Lactate is normal -Most likely etiology of sepsis is septic arthritis of the left knee; he previously did have joint aspiration and culture has remained negative to date, but this doesn't preclude him from now having an infected joint. -blood and urine cx's w/o growth -Broad spectrum coverage with Vanc/Zosyn as per protocol for sepsis of uncertain source in place - orthopedic consulted for further assistance and rec's -septic features essentially resolved now  Gout -It is also possible that the patient is having a systemic reaction to gout or even systemic gout -His uric acid level is elevated, although likely not as high as would be expected with systemic gout -Will continue Allopurinol for now, but will hold on any  further  ongoing steroids (was given 125 mg Solumedrol in the ER)  -Patient would likely benefit from upward titration of Allopurinol to decrease uric acid level below 7 in the near future -orthopedic service consulted; if proven not septic joint, might benefit of steroidal knee injection   CKD: stage 3 at baseline  -Appears to be stable at this time -will monitor renal function trend   Ischemic cardiomyopathy with CHF -Currently appears to be compensated -Echo in 10/16 showed EF 30-35%; this may need repeating as an outpatient but currently this doesn't appear to be an acute issue for him -He reports h/o murmur; which was appreciated on my exam -will hold on lasix for one more day; planning to resume on 10/5  DM with nephropathy  -Hold Glucophage -will Cover with SSI while inpatient -modified carbohydrates diet has been ordered   HTN -Continue Coreg, Nifedipine, Lisinopril -will follow BP and adjust medications as needed   HLD -Continue Lipitor  COPD -Continue home Albuterol nebs as needed  Code Status: Full code Family Communication: no family at bedside  Disposition Plan: to be determine; PT has been asked to see patient to assess mobility and ambulation capacity/needs. Will also follow rec's from orthopedic service. Will watch clinical response overnight and continue abx's for now.   Consultants:  Orthopedic service   Procedures:  See below for x-ray reports   Antibiotics:  vanc and zosyn 09/29/16  HPI/Subjective: Afebrile, reporting improvement in his left knee swelling and pain.  Objective: Vitals:   09/30/16 0621 09/30/16 1300  BP: (!) 158/80 139/87  Pulse: 92 70  Resp: 18 20  Temp: 98.2 F (36.8 C) 97.8 F (36.6 C)    Intake/Output Summary (Last 24 hours) at 09/30/16 1711 Last data filed at 09/30/16 1300  Gross per 24 hour  Intake           1667.5 ml  Output                0 ml  Net           1667.5 ml   Filed Weights   09/30/16 0037  09/30/16 0050  Weight: 87.9 kg (193 lb 12.6 oz) 87.4 kg (192 lb 10.9 oz)    Exam:   General:  Afebrile, in no distress currently. Reports improvement in his left knee pain, even still present, slightly swollen and with decrease ROM.  Cardiovascular: S1 and S2, no rubs, no gallops; positive SEM  Respiratory: CTA bilaterally   Abdomen: soft, NT, ND, positive BS  Musculoskeletal: no cyanosis or clubbing. Patient left knee with decrease ROM, some tenderness and mild swelling, no warmness appreciated on exam.  Data Reviewed: Basic Metabolic Panel:  Recent Labs Lab 09/25/16 1204 09/29/16 2040 09/30/16 0139  NA 135 136 134*  K 3.5 4.0 4.3  CL 101 104 102  CO2 21* 21* 21*  GLUCOSE 128* 169* 307*  BUN 20 15 18   CREATININE 1.65* 1.29* 1.55*  CALCIUM 9.1 8.9 8.7*   Liver Function Tests:  Recent Labs Lab 09/25/16 1204  AST 20  ALT 10*  ALKPHOS 57  BILITOT 1.0  PROT 8.8*  ALBUMIN 3.8   CBC:  Recent Labs Lab 09/25/16 1204 09/29/16 2040 09/30/16 0139  WBC 9.2 7.9 9.9  NEUTROABS 6.5 5.9  --   HGB 13.0 11.5* 11.0*  HCT 38.6* 33.9* 33.5*  MCV 91.7 88.7 92.0  PLT 196 259 256   BNP (last 3 results)  Recent Labs  08/20/16 0455  BNP 766.2*    ProBNP (last 3 results) No results for input(s): PROBNP in the last 8760 hours.  CBG: No results for input(s): GLUCAP in the last 168 hours.  Recent Results (from the past 240 hour(s))  Culture, blood (Routine x 2)     Status: None   Collection Time: 09/25/16 12:04 PM  Result Value Ref Range Status   Specimen Description BLOOD LEFT FOREARM  Final   Special Requests   Final    BOTTLES DRAWN AEROBIC AND ANAEROBIC BLUE 5CC, RED 4CC   Culture   Final    NO GROWTH 5 DAYS Performed at [facility]    Report Status 09/30/2016 FINAL  Final  Culture, body fluid-bottle     Status: None   Collection Time: 09/25/16 12:38 PM  Result Value Ref Range Status   Specimen Description SYNOVIAL LEFT KNEE  Final   Special  Requests NONE  Final   Culture   Final    NO GROWTH 5 DAYS Performed at [facility]    Report Status 09/30/2016 FINAL  Final  Gram stain     Status: None   Collection Time: 09/25/16 12:38 PM  Result Value Ref Range Status   Specimen Description SYNOVIAL LEFT KNEE  Final   Special Requests NONE  Final   Gram Stain   Final    MODERATE WBC PRESENT, PREDOMINANTLY PMN NO ORGANISMS SEEN Performed at [facility]    Report Status 09/26/2016 FINAL  Final  Urine culture     Status: None  Collection Time: 09/25/16  1:52 PM  Result Value Ref Range Status   Specimen Description URINE, CLEAN CATCH  Final   Special Requests NONE  Final   Culture NO GROWTH Performed at [facility]   Final   Report Status 09/26/2016 FINAL  Final  MRSA PCR Screening     Status: None   Collection Time: 09/30/16  8:36 AM  Result Value Ref Range Status   MRSA by PCR NEGATIVE NEGATIVE Final    Comment:        The GeneXpert MRSA Assay (FDA approved for NASAL specimens only), is one component of a comprehensive MRSA colonization surveillance program. It is not intended to diagnose MRSA infection nor to guide or monitor treatment for MRSA infections.      Studies: Dg Chest 2 View  Result Date: 09/30/2016 CLINICAL DATA:  60 year old male with shortness of breath. EXAM: CHEST  2 VIEW COMPARISON:  Chest radiograph dated 09/25/2016 FINDINGS: Two views of the chest demonstrates stable cardiomegaly. Median sternotomy wires and CABG vascular clips noted. Mild central vascular prominence may represent mild vascular congestion. No pulmonary edema. There is no focal consolidation, pleural effusion, or pneumothorax. No acute osseous pathology. IMPRESSION: Stable cardiomegaly with possible mild congestive changes. No focal consolidation or pulmonary edema. Electronically Signed   By: Anner Crete M.D.   On: 09/30/2016 00:51   Dg Knee Complete 4 Views Left  Result Date: 09/29/2016 CLINICAL  DATA:  Left knee pain and swelling for 2 weeks. EXAM: LEFT KNEE - COMPLETE 4+ VIEW COMPARISON:  None. FINDINGS: No acute fracture or dislocation. No lytic or sclerotic osseous lesion. Small joint effusion. Surgical clips in the posterior distal thigh. IMPRESSION: No acute osseous injury of the left knee. Electronically Signed   By: Kathreen Devoid   On: 09/29/2016 19:57    Scheduled Meds: . allopurinol  200 mg Oral Daily  . atorvastatin  80 mg Oral Daily  . carvedilol  6.25 mg Oral BID  . enoxaparin (LOVENOX) injection  40 mg Subcutaneous Q24H  . insulin aspart  0-9 Units Subcutaneous TID WC  . ipratropium-albuterol  3 mL Nebulization BID  . NIFEdipine  60 mg Oral Daily  . piperacillin-tazobactam (ZOSYN)  IV  3.375 g Intravenous Q8H  . sodium chloride flush  3 mL Intravenous Q12H  . vancomycin  1,000 mg Intravenous Q12H   Continuous Infusions: . sodium chloride 125 mL/hr at 09/29/16 2154    Principal Problem:   Sepsis ([place]) Active Problems:   CKD (chronic kidney disease)   Gout   Cardiomyopathy, ischemic, chronic systolic CHF   Essential hypertension   Lipid disorder   COPD with chronic bronchitis ([place])   Diabetes mellitus type 2, uncontrolled ([place])    Time spent: 25 minutes    Barton Dubois  [facility] Pager [phone]. If 7PM-7AM, please contact night-coverage at [email or web address], password [facility] 09/30/2016, 5:11 PM  LOS: 0 days

## 2016-09-30 NOTE — Progress Notes (Signed)
Patient ID: Patrick Stewart, male   DOB: 1956/12/07, 60 y.o.   MRN: [id number] I was asked to come and see Patrick Stewart since he was referred to my clinic from the ED this past weekend when I was on-call.  I was called late this afternoon and have reviewed his chart and vitals.  I will come by and see him tomorrow morning and assess his knee then to determine any treatment needed since I am operating at [facility] mid-morning tomorrow 10/5.

## 2016-09-30 NOTE — Progress Notes (Signed)
Nutrition Brief Note  Patient identified on the Malnutrition Screening Tool (MST) Report  Wt Readings from Last 15 Encounters:  09/30/16 192 lb 10.9 oz (87.4 kg)  09/25/16 193 lb (87.5 kg)  09/11/16 192 lb 6.4 oz (87.3 kg)  08/26/16 205 lb 3.2 oz (93.1 kg)  08/22/16 200 lb (90.7 kg)  08/17/16 200 lb (90.7 kg)  08/10/16 200 lb (90.7 kg)  04/29/16 200 lb (90.7 kg)  04/25/16 200 lb (90.7 kg)  02/14/16 194 lb (88 kg)  01/30/16 194 lb (88 kg)  01/29/16 196 lb (88.9 kg)  10/13/15 200 lb 13.4 oz (91.1 kg)  07/17/15 191 lb (86.6 kg)  07/13/15 192 lb (87.1 kg)    Body mass index is 27.65 kg/m. Patient meets criteria for overweight based on current BMI. Skin WLD.   Current diet order is Regular, patient is consuming approximately 50-75% of meals at this time.  Pt reports good appetite at this time. He states that at home he always has a good appetite and has no recent changes in appetite or PO intakes. He denies abdominal pain or nausea now or with POs PTA. Pt denies any chewing or swallowing issues. He states that he often loses a few pounds and then regains this weight; has fluctuations within a 5-8 lb range, per pt.   Labs and medications reviewed. Pt states he checks CBGs at home and that he usually has a reading of 140-150 mg/dL; he clarifies to state that this is reading after eating.   No nutrition interventions warranted at this time. If nutrition issues arise, please consult RD.     Jarome Matin, MS, RD, LDN Inpatient Clinical Dietitian Pager # [phone] After hours/weekend pager # [phone]

## 2016-09-30 NOTE — Progress Notes (Signed)
Pharmacy Antibiotic Note  Patrick Stewart is a 60 y.o. male admitted on 09/29/2016 with sepsis.  Pharmacy has been consulted for Vancomycin and Zosyn  dosing.  Plan: Vancomycin 1gm IV every 12 hours.  Goal trough 15-20 mcg/mL. Zosyn 3.375g IV q8h (4 hour infusion).  Weight: 193 lb 12.6 oz (87.9 kg)  Temp (24hrs), Avg:99.6 F (37.6 C), Min:98.5 F (36.9 C), Max:100.8 F (38.2 C)   Recent Labs Lab 09/25/16 1204 09/25/16 1216 09/29/16 2040 09/30/16 0012  WBC 9.2  --  7.9  --   CREATININE 1.65*  --  1.29*  --   LATICACIDVEN  --  2.36*  --  0.82    Estimated Creatinine Clearance: 67.2 mL/min (by C-G formula based on SCr of 1.29 mg/dL (H)).    Allergies  Allergen Reactions  . Hydrocodone Itching  . Tramadol Nausea Only    Antimicrobials this admission: Vancomycin 09/30/2016 >> Zosyn 09/30/2016 >>   Dose adjustments this admission: -  Microbiology results: pending  Thank you for allowing pharmacy to be a part of this patient's care.  Nani Skillern Crowford 09/30/2016 1:00 AM

## 2016-10-01 ENCOUNTER — Ambulatory Visit: Payer: Medicare Other | Admitting: Cardiovascular Disease

## 2016-10-01 DIAGNOSIS — M25462 Effusion, left knee: Secondary | ICD-10-CM

## 2016-10-01 DIAGNOSIS — R6510 Systemic inflammatory response syndrome (SIRS) of non-infectious origin without acute organ dysfunction: Secondary | ICD-10-CM

## 2016-10-01 LAB — GLUCOSE, CAPILLARY
GLUCOSE-CAPILLARY: 207 mg/dL — AB (ref 65–99)
GLUCOSE-CAPILLARY: 169 mg/dL — AB (ref 65–99)
Glucose-Capillary: 268 mg/dL — ABNORMAL HIGH (ref 65–99)
Glucose-Capillary: 197 mg/dL — ABNORMAL HIGH (ref 65–99)

## 2016-10-01 MED ORDER — COLCHICINE 0.6 MG PO TABS
0.6000 mg | ORAL_TABLET | Freq: Two times a day (BID) | ORAL | Status: DC
  Administered 2016-10-01 – 2016-10-02 (×2): 0.6 mg via ORAL
  Filled 2016-10-01 (×2): qty 1

## 2016-10-01 MED ORDER — DOCUSATE SODIUM 100 MG PO CAPS
100.0000 mg | ORAL_CAPSULE | Freq: Two times a day (BID) | ORAL | Status: DC
  Administered 2016-10-01 (×2): 100 mg via ORAL
  Filled 2016-10-01 (×3): qty 1

## 2016-10-01 MED ORDER — ALLOPURINOL 300 MG PO TABS
300.0000 mg | ORAL_TABLET | Freq: Every day | ORAL | Status: DC
  Administered 2016-10-02: 300 mg via ORAL
  Filled 2016-10-01: qty 1

## 2016-10-01 MED ORDER — LIDOCAINE HCL 1 % IJ SOLN
INTRAMUSCULAR | Status: AC
  Administered 2016-10-01: 13:00:00
  Filled 2016-10-01: qty 20

## 2016-10-01 MED ORDER — IPRATROPIUM-ALBUTEROL 0.5-2.5 (3) MG/3ML IN SOLN: 3.0000 mL | Freq: Four times a day (QID) | RESPIRATORY_TRACT | Status: DC | PRN

## 2016-10-01 MED ORDER — POLYETHYLENE GLYCOL 3350 17 G PO PACK
17.0000 g | PACK | Freq: Every day | ORAL | Status: DC
  Administered 2016-10-01: 17 g via ORAL
  Filled 2016-10-01 (×2): qty 1

## 2016-10-01 MED ORDER — FUROSEMIDE 40 MG PO TABS
40.0000 mg | ORAL_TABLET | Freq: Every day | ORAL | Status: DC
  Administered 2016-10-02: 40 mg via ORAL
  Filled 2016-10-01 (×2): qty 1

## 2016-10-01 NOTE — Evaluation (Signed)
Physical Therapy Evaluation Patient Details Name: Patrick Stewart MRN: [id number] DOB: [birth year] Today's Date: 10/01/2016   History of Present Illness  60 yo male admitted with sepsis, gout. Hx of CAD, CABG, DM, HTN, COPD, gout, neuropathy  Clinical Impression  On eval, pt required Min assist for mobility. He walked ~50 feet with a RW. Pain rated 7/10 with activity. Recommend RW use for ambulation until pain lessens/returns to baseline. Recommend daily ambulation with nursing supervision/assist.     Follow Up Recommendations Supervision/assist for mobility/OOB    Equipment Recommendations  Rolling walker with 5" wheels    Recommendations for Other Services       Precautions / Restrictions Precautions Precautions: Fall Restrictions Weight Bearing Restrictions: No      Mobility  Bed Mobility Overal bed mobility: Needs Assistance Bed Mobility: Supine to Sit;Sit to Supine     Supine to sit: Min guard Sit to supine: Min guard;HOB elevated   General bed mobility comments: close guard for safety  Transfers Overall transfer level: Needs assistance Equipment used: Rolling walker (2 wheeled) Transfers: Sit to/from Stand Sit to Stand: Min assist;From elevated surface         General transfer comment: Assist to rise, stabilize, control descent. VCs safety, hand/LE placement  Ambulation/Gait Ambulation/Gait assistance: Min assist Ambulation Distance (Feet): 50 Feet Assistive device: Rolling walker (2 wheeled) Gait Pattern/deviations: Step-to pattern;Antalgic     General Gait Details: VCs safety, sequence. slow gait speed.   Stairs            Wheelchair Mobility    Modified Rankin (Stroke Patients Only)       Balance                                             Pertinent Vitals/Pain Pain Assessment: 0-10 Pain Score: 7  Pain Location: L knee with activity Pain Descriptors / Indicators: Sore;Tender;Grimacing Pain Intervention(s):  Limited activity within patient's tolerance;Repositioned    Home Living Family/patient expects to be discharged to:: Private residence Living Arrangements: Spouse/significant other   Type of Home: Apartment Home Access: Elevator       Home Equipment: Crutches      Prior Function Level of Independence: Independent               Hand Dominance        Extremity/Trunk Assessment   Upper Extremity Assessment: Overall WFL for tasks assessed           Lower Extremity Assessment: LLE deficits/detail   LLE Deficits / Details: swelling noted.   Cervical / Trunk Assessment: Normal  Communication   Communication: No difficulties  Cognition Arousal/Alertness: Awake/alert Behavior During Therapy: WFL for tasks assessed/performed Overall Cognitive Status: Within Functional Limits for tasks assessed                      General Comments      Exercises     Assessment/Plan    PT Assessment Patient needs continued PT services  PT Problem List Decreased strength;Decreased mobility;Decreased activity tolerance;Pain;Decreased knowledge of use of DME          PT Treatment Interventions DME instruction;Gait training;Therapeutic activities;Therapeutic exercise;Stair training;Functional mobility training;Patient/family education    PT Goals (Current goals can be found in the Care Plan section)  Acute Rehab PT Goals Patient Stated Goal: less pain PT Goal Formulation: With patient Time For  Goal Achievement: 10/15/16 Potential to Achieve Goals: Good    Frequency Min 3X/week   Barriers to discharge        Co-evaluation               End of Session Equipment Utilized During Treatment: Gait belt Activity Tolerance: Patient limited by pain Patient left: in bed;with call bell/phone within reach;with family/visitor present;with bed alarm set           Time: 2952-8413 PT Time Calculation (min) (ACUTE ONLY): 16 min   Charges:   PT Evaluation $PT  Eval Low Complexity: 1 Procedure     PT G Codes:        Weston Anna, MPT Pager: [phone]

## 2016-10-01 NOTE — Progress Notes (Signed)
Inpatient Diabetes Program Recommendations  AACE/ADA: New Consensus Statement on Inpatient Glycemic Control (2015)  Target Ranges:  Prepandial:   less than 140 mg/dL      Peak postprandial:   less than 180 mg/dL (1-2 hours)      Critically ill patients:  140 - 180 mg/dL   Results for JIN, CAPOTE (MRN [id number]) as of 10/01/2016 11:22  Ref. Range 09/30/2016 17:15 09/30/2016 21:09 10/01/2016 07:35  Glucose-Capillary Latest Ref Range: 65 - 99 mg/dL 307 (H) 279 (H) 268 (H)   Results for CIARAN, BEGAY (MRN [id number]) as of 10/01/2016 11:22  Ref. Range 10/13/2015 00:18 04/29/2016 11:44 08/10/2016 21:51 08/20/2016 04:55 08/26/2016 09:48  Hemoglobin A1C Latest Ref Range: 4.6 - 6.5 % 7.8 (H)    9.3 (H) NOTE   Review of Glycemic Control  Diabetes history: DM2 Outpatient Diabetes medications: Metformin 1000 mg po BID Current orders for Inpatient glycemic control: Novolog 0-9 units [facility]  Inpatient Diabetes Program Recommendations: Please consider:      Adding Novolog 0-5 units QHS;      Lantus 12 units daily. May benefit from follow up with PCP to adjust home management of long-term blood glucose control.  Thank you,  Windy Carina, RN, BSN Diabetes Coordinator Inpatient Diabetes Program [phone] (Team Pager) [phone] ([facility] office) [phone] [facility] office) [phone] [facility] office)

## 2016-10-01 NOTE — Progress Notes (Signed)
Patient ID: Patrick Stewart, male   DOB: 1956-03-15, 60 y.o.   MRN: [id number] I came by this evening and placed a steroid injection into his left knee and he tolerated it very well.  Appears more like gout.  I'll check on him again tomorrow.

## 2016-10-01 NOTE — Progress Notes (Signed)
Patient ID: Patrick Stewart, male   DOB: 1956/12/04, 59 y.o.   MRN: [id number] I examined [name] and assessed his left knee.  There is no effusion today.  There is slight warmth, but no redness.  I can flex and extend his knee pretty easily with some pain, but not the pain you would expect from a septic joint.  I then tried to aspirate his left knee and got only a drop of fluid.  Given the crystals seen on his first aspiration, this really looks like gout.  I do not see an indication for surgery.  I can come back and put a steroid injection in his knee this evening vs tomorrow.

## 2016-10-01 NOTE — Progress Notes (Addendum)
NUTRITION NOTE  RD consulted for nutrition education regarding diabetes: patient admitted with increased A1C (9.3). Please discuss diet and exercise options with patient.  Lab Results  Component Value Date   HGBA1C 9.3 (H) 08/26/2016    RD provided "Carbohydrate Counting for People with Diabetes" handout from the Academy of Nutrition and Dietetics. Discussed different food groups and their effects on blood sugar, emphasizing carbohydrate-containing foods. Provided list of carbohydrates and recommended serving sizes of common foods.  Discussed importance of controlled and consistent carbohydrate intake throughout the day. Provided examples of ways to balance meals/snacks and encouraged intake of high-fiber, whole grain complex carbohydrates. Teach back method used.  Pt reports that he moved to the area about 1 year ago and has not met with anyone concerning DM diet since that time. Prior to moving, pt had seen an outpatient RD several times who provided diet education and guidance for pt. He was able to provide examples of carbohydrate foods, the need for portion control, and the importance of protein with each meal or snack. Pt states that he often uses the internet to look up information related to food. He is unsure of number of carbohydrates to have at each meal and often takes small portions of all items available (he states he is not a picky eater and likes all foods).   He states that PTA he was taking 500 mg Metformin BID (once in the AM and once in the PM) and would take it at the same time each day. He was checking blood sugar ~0845 every other morning and reports reading usually 150-160 mg/dL.  Pt does report that he has limited/eliminated foods such as cheese, processed meats, and spinach d/t hx of gout with flares when eating these items. He states he also limits these items d/t sodium content (noted pt with hx of HTN).   Expect good compliance.  Body mass index is 27.65 kg/m. Pt  meets criteria for overweight based on current BMI.  Current diet order is Carb Modified, patient is consuming approximately 75% of meals at this time. Labs and medications reviewed. No further nutrition interventions warranted at this time. RD contact information provided. If additional nutrition issues arise, please re-consult RD.   Jarome Matin, MS, RD, LDN Inpatient Clinical Dietitian Pager # [phone] After hours/weekend pager # [phone]

## 2016-10-01 NOTE — Progress Notes (Signed)
[facility] PROGRESS NOTE  Patrick Stewart LMB:[id number] DOB: 1956-11-11 DOA: 09/29/2016 PCP: Shelda Pal, DO  Interim summary and HPI 60 y.o. male with medical history significant of CAD s/o CABG, DM, HTN, COPD and gout presenting with knee pain for about 3 weeks.  Comes and goes.  Got severe over the last week.  Left knee pain.  Feels "like it ain't nothing down there."  A little bit numb.  Pain up to 9/10 at its worst, never goes away completely.  At its best, 5-6/10.  Has been having gout attacks since he was young.  Nothing makes the pain better, "the inflammation what's making it swell.  Quite nice if they give me oxycodone but it's not doing what it's supposed to."  Pain gets better but then comes back.  Was scheduled to see Dr. Ninfa Linden for this problem on Monday but "I couldn't get up to him" because he couldn't walk.    Assessment/Plan: Sepsis has been R/O: SIRS due to Gout -Initial call was for inadequately controlled gout pain with failure of outpatient management -However, the patient had decompensated somewhat by the time he was evaluated by my colleague; he was febrile, with tachypnea and tachycardia. -BP has been high consistently so no septic shock or severe sepsis present  -Lactate is normal -he previously did have joint aspiration and culture has remained negative to date -Sepsis has been r/o by now. Everything appears to be SIRS reaction from his Gout. -blood and urine cx's w/o growth -Broad spectrum coverage with Vanc/Zosyn discontinued now; will observe off abx's - orthopedic consulted for further assistance and rec's -septic features essentially resolved now -will follow clinical response   Gout -It is also possible that the patient is having a systemic reaction to gout or even systemic gout -His uric acid level is elevated; goal is for uric acid < 7 -will titrate allopurinol to 300mg  and start colchicine -orthopedic service consulted; agree with  presentation fitting gout and no septic arthritis. Plan is for steroidal joint shot to be done later.  -will continue PT -will follow clinical response   CKD: stage 3 at baseline  -Appears to be stable at this time -will monitor renal function trend   Ischemic cardiomyopathy with CHF -Currently appears to be compensated -Echo in 10/16 showed EF 30-35%; this may need repeating as an outpatient but currently this doesn't appear to be an acute issue for him -He reports h/o murmur; which was appreciated on my exam -will resume lasix today  DM with nephropathy  -Hold Glucophage -will Cover with SSI while inpatient -modified carbohydrates diet has been ordered   HTN -Continue Coreg, Nifedipine, Lisinopril -will follow BP and adjust medications as needed   HLD -Continue Lipitor  COPD -Continue home Albuterol nebs as needed  Code Status: Full code Family Communication: no family at bedside  Disposition Plan: to be determine; PT has been asked to see patient to assess mobility and ambulation capacity/needs. Will also follow rec's from orthopedic service. Will discontinue antibiotics, start colchicine and increase allopurinol. Orthopedic surgery planning steroidal joint injection later today.   Consultants:  Orthopedic service   Procedures:  See below for x-ray reports   Antibiotics:  vanc and zosyn 09/29/16  HPI/Subjective: Afebrile, no CP and no SOB. Patient still with left knee pain and reporting decrease RAOm and difficulty with ambulation.   Objective: Vitals:   10/01/16 0900 10/01/16 1509  BP: 112/73 133/85  Pulse: 72 76  Resp: 14 16  Temp: 97.5 F (  36.4 C) 97.8 F (36.6 C)    Intake/Output Summary (Last 24 hours) at 10/01/16 1649 Last data filed at 10/01/16 1500  Gross per 24 hour  Intake          4411.25 ml  Output              850 ml  Net          3561.25 ml   Filed Weights   09/30/16 0037 09/30/16 0050  Weight: 87.9 kg (193 lb 12.6 oz) 87.4 kg  (192 lb 10.9 oz)    Exam:   General:  Afebrile, in no distress currently. Reports pain in his left knee pain and decrease ROM. Left knee slightly swollen and with pain on palpation/movement.  Cardiovascular: S1 and S2, no rubs, no gallops; positive SEM  Respiratory: CTA bilaterally   Abdomen: soft, NT, ND, positive BS  Musculoskeletal: no cyanosis or clubbing. Patient left knee with decrease ROM, some tenderness and mild swelling, no warmness appreciated on exam and no erythema..  Data Reviewed: Basic Metabolic Panel:  Recent Labs Lab 09/25/16 1204 09/29/16 2040 09/30/16 0139  NA 135 136 134*  K 3.5 4.0 4.3  CL 101 104 102  CO2 21* 21* 21*  GLUCOSE 128* 169* 307*  BUN 20 15 18   CREATININE 1.65* 1.29* 1.55*  CALCIUM 9.1 8.9 8.7*   Liver Function Tests:  Recent Labs Lab 09/25/16 1204  AST 20  ALT 10*  ALKPHOS 57  BILITOT 1.0  PROT 8.8*  ALBUMIN 3.8   CBC:  Recent Labs Lab 09/25/16 1204 09/29/16 2040 09/30/16 0139  WBC 9.2 7.9 9.9  NEUTROABS 6.5 5.9  --   HGB 13.0 11.5* 11.0*  HCT 38.6* 33.9* 33.5*  MCV 91.7 88.7 92.0  PLT 196 259 256   BNP (last 3 results)  Recent Labs  08/20/16 0455  BNP 766.2*   CBG:  Recent Labs Lab 09/30/16 1715 09/30/16 2109 10/01/16 0735 10/01/16 1226 10/01/16 1647  GLUCAP 307* 279* 268* 207* 169*    Recent Results (from the past 240 hour(s))  Culture, blood (Routine x 2)     Status: None   Collection Time: 09/25/16 12:04 PM  Result Value Ref Range Status   Specimen Description BLOOD LEFT FOREARM  Final   Special Requests   Final    BOTTLES DRAWN AEROBIC AND ANAEROBIC BLUE 5CC, RED 4CC   Culture   Final    NO GROWTH 5 DAYS Performed at [facility]    Report Status 09/30/2016 FINAL  Final  Culture, body fluid-bottle     Status: None   Collection Time: 09/25/16 12:38 PM  Result Value Ref Range Status   Specimen Description SYNOVIAL LEFT KNEE  Final   Special Requests NONE  Final   Culture   Final     NO GROWTH 5 DAYS Performed at [facility]    Report Status 09/30/2016 FINAL  Final  Gram stain     Status: None   Collection Time: 09/25/16 12:38 PM  Result Value Ref Range Status   Specimen Description SYNOVIAL LEFT KNEE  Final   Special Requests NONE  Final   Gram Stain   Final    MODERATE WBC PRESENT, PREDOMINANTLY PMN NO ORGANISMS SEEN Performed at [facility]    Report Status 09/26/2016 FINAL  Final  Urine culture     Status: None   Collection Time: 09/25/16  1:52 PM  Result Value Ref Range Status   Specimen Description URINE,  CLEAN CATCH  Final   Special Requests NONE  Final   Culture NO GROWTH Performed at [facility]   Final   Report Status 09/26/2016 FINAL  Final  Culture, blood (Routine X 2) w Reflex to ID Panel     Status: None (Preliminary result)   Collection Time: 09/30/16 12:01 AM  Result Value Ref Range Status   Specimen Description BLOOD LEFT WRIST  Final   Special Requests BOTTLES DRAWN AEROBIC AND ANAEROBIC 5CC  Final   Culture   Final    NO GROWTH 1 DAY Performed at [facility]    Report Status PENDING  Incomplete  Culture, blood (Routine X 2) w Reflex to ID Panel     Status: None (Preliminary result)   Collection Time: 09/30/16 12:01 AM  Result Value Ref Range Status   Specimen Description BLOOD RIGHT WRIST  Final   Special Requests IN PEDIATRIC BOTTLE [place]  Final   Culture   Final    NO GROWTH 1 DAY Performed at [facility]    Report Status PENDING  Incomplete  MRSA PCR Screening     Status: None   Collection Time: 09/30/16  8:36 AM  Result Value Ref Range Status   MRSA by PCR NEGATIVE NEGATIVE Final    Comment:        The GeneXpert MRSA Assay (FDA approved for NASAL specimens only), is one component of a comprehensive MRSA colonization surveillance program. It is not intended to diagnose MRSA infection nor to guide or monitor treatment for MRSA infections.      Studies: Dg Chest 2  View  Result Date: 09/30/2016 CLINICAL DATA:  60 year old male with shortness of breath. EXAM: CHEST  2 VIEW COMPARISON:  Chest radiograph dated 09/25/2016 FINDINGS: Two views of the chest demonstrates stable cardiomegaly. Median sternotomy wires and CABG vascular clips noted. Mild central vascular prominence may represent mild vascular congestion. No pulmonary edema. There is no focal consolidation, pleural effusion, or pneumothorax. No acute osseous pathology. IMPRESSION: Stable cardiomegaly with possible mild congestive changes. No focal consolidation or pulmonary edema. Electronically Signed   By: Anner Crete M.D.   On: 09/30/2016 00:51   Dg Knee Complete 4 Views Left  Result Date: 09/29/2016 CLINICAL DATA:  Left knee pain and swelling for 2 weeks. EXAM: LEFT KNEE - COMPLETE 4+ VIEW COMPARISON:  None. FINDINGS: No acute fracture or dislocation. No lytic or sclerotic osseous lesion. Small joint effusion. Surgical clips in the posterior distal thigh. IMPRESSION: No acute osseous injury of the left knee. Electronically Signed   By: Kathreen Devoid   On: 09/29/2016 19:57    Scheduled Meds: . Derrill Memo ON 10/02/2016] allopurinol  300 mg Oral Daily  . atorvastatin  80 mg Oral Daily  . carvedilol  6.25 mg Oral BID  . colchicine  0.6 mg Oral BID  . docusate sodium  100 mg Oral BID  . enoxaparin (LOVENOX) injection  40 mg Subcutaneous Q24H  . insulin aspart  0-9 Units Subcutaneous TID WC  . ipratropium-albuterol  3 mL Nebulization BID  . NIFEdipine  60 mg Oral Daily  . polyethylene glycol  17 g Oral Daily  . sodium chloride flush  3 mL Intravenous Q12H   Continuous Infusions: . sodium chloride 125 mL/hr at 10/01/16 1239    Principal Problem:   Sepsis ([place]) Active Problems:   CKD (chronic kidney disease)   Gout   Cardiomyopathy, ischemic, chronic systolic CHF   Essential hypertension   Lipid disorder  COPD with chronic bronchitis ([place])   Diabetes mellitus type 2, uncontrolled  ([place])    Time spent: 25 minutes    Barton Dubois  [facility] Pager [phone]. If 7PM-7AM, please contact night-coverage at [email or web address], password [facility] 10/01/2016, 4:49 PM  LOS: 1 day

## 2016-10-02 DIAGNOSIS — M10062 Idiopathic gout, left knee: Principal | ICD-10-CM

## 2016-10-02 LAB — GLUCOSE, CAPILLARY
GLUCOSE-CAPILLARY: 230 mg/dL — AB (ref 65–99)
GLUCOSE-CAPILLARY: 184 mg/dL — AB (ref 65–99)

## 2016-10-02 MED ORDER — COLCHICINE 0.6 MG PO TABS: 0.6000 mg | ORAL_TABLET | Freq: Two times a day (BID) | ORAL | 0 refills | Status: DC

## 2016-10-02 MED ORDER — DOCUSATE SODIUM 100 MG PO CAPS: 100.0000 mg | ORAL_CAPSULE | Freq: Two times a day (BID) | ORAL | 0 refills | Status: DC

## 2016-10-02 MED ORDER — POLYETHYLENE GLYCOL 3350 17 G PO PACK: 17.0000 g | PACK | Freq: Every day | ORAL | 0 refills | Status: DC

## 2016-10-02 MED ORDER — ALLOPURINOL 300 MG PO TABS: 300.0000 mg | ORAL_TABLET | Freq: Every day | ORAL | 1 refills | Status: DC

## 2016-10-02 MED ORDER — OXYCODONE HCL 10 MG PO TABS: 10.0000 mg | ORAL_TABLET | Freq: Four times a day (QID) | ORAL | 0 refills | Status: DC | PRN

## 2016-10-02 NOTE — Care Management Note (Signed)
Case Management Note  Patient Details  Name: Patrick Stewart MRN: [id number] Date of Birth: 1956/04/03  Subjective/Objective:  PT recc no f/u,home rw. [facility] dme rep aware of rw order to bring to rm prior d/c.Nsg aware.  No further CM needs.                Action/Plan:d/c home.   Expected Discharge Date:                  Expected Discharge Plan:  Home/Self Care  In-House Referral:     Discharge planning Services  CM Consult  Post Acute Care Choice:    Choice offered to:  Patient  DME Arranged:  [facility] rolling DME Agency:  [place]:    [place]:     Status of Service:  Completed, signed off  If discussed at [place] of Stay Meetings, dates discussed:    Additional Comments:  Dessa Phi, RN 10/02/2016, 10:55 AM

## 2016-10-02 NOTE — Progress Notes (Signed)
PT Cancellation Note  Patient Details Name: Patrick Stewart MRN: [id number] DOB: Aug 27, 1956   Cancelled Treatment:    Reason Eval/Treat Not Completed: Checked on pt. He reports he is going home today. Recommend RW use for ambulation.    Weston Anna, MPT Pager: [phone]

## 2016-10-02 NOTE — Progress Notes (Signed)
Patient ID: Patrick Stewart, male   DOB: 04-23-1956, 60 y.o.   MRN: [id number] Still seems to have significant left knee pain, however, no effusion.  Seems to tolerated limited flexion and extension, but when I tried to flex his knee further, he had significant pain.  This seems to be more from soft-tissue contracture due to his limited mobility and guarding of his knee.  His vitals continue to remain stable.  The steroid injection will still take a few days to calm down his inflammation.  He can follow-up as an outpatient.

## 2016-10-02 NOTE — Discharge Instructions (Signed)
Gout Gout is when your joints become red, sore, and swell (inflamed). This is caused by the buildup of uric acid crystals in the joints. Uric acid is a chemical that is normally in the blood. If the level of uric acid gets too high in the blood, these crystals form in your joints and tissues. Over time, these crystals can form into masses near the joints and tissues. These masses can destroy bone and cause the bone to look misshapen (deformed). HOME CARE   Do not take aspirin for pain.  Only take medicine as told by your doctor.  Rest the joint as much as you can. When in bed, keep sheets and blankets off painful areas.  Keep the sore joints raised (elevated).  Put warm or cold packs on painful joints. Use of warm or cold packs depends on which works best for you.  Use crutches if the painful joint is in your leg.  Drink enough fluids to keep your pee (urine) clear or pale yellow. Limit alcohol, sugary drinks, and drinks with fructose in them.  Follow your diet instructions. Pay careful attention to how much protein you eat. Include fruits, vegetables, whole grains, and fat-free or low-fat milk products in your daily diet. Talk to your doctor or dietitian about the use of coffee, vitamin C, and cherries. These may help lower uric acid levels.  Keep a healthy body weight. GET HELP RIGHT AWAY IF:   You have watery poop (diarrhea), throw up (vomit), or have any side effects from medicines.  You do not feel better in 24 hours, or you are getting worse.  Your joint becomes suddenly more tender, and you have chills or a fever. MAKE SURE YOU:   Understand these instructions.  Will watch your condition.  Will get help right away if you are not doing well or get worse.   This information is not intended to replace advice given to you by your health care provider. Make sure you discuss any questions you have with your health care provider.   Document Released: 09/22/2008 Document Revised:  01/04/2015 Document Reviewed: 07/27/2012 Elsevier Interactive Patient Education 2016 [place] Use HOW TO TELL IF A WALKER IS THE RIGHT SIZE  With your arms hanging at your sides, the walker handles should be at wrist level. If you cannot find the exact fit, choose the height that is most comfortable.  If you have been instructed to not place weight on one of your legs, you may feel more comfortable with a shorter height. If you are using the walker for balance, you may prefer a taller height.  Adjust the height by using the push buttons on the legs of your walker.  In rest position, the back leg of the walkers should be no further ahead than your toes. With your hands resting on the grips, your elbows should be slightly bent at about a 30 degree angle.  Ask your physical therapist or caregiver if you have any concerns. HOW TO USE A STANDARD WALKER (NO WHEELS)  Pick your walker up (do not slide your walker) and place it one step length in front of you. The back legs of the walker should be no further ahead than your toes. You should not feel like you need to lean forward to keep your hands on the grips. As you set the walker down, make sure all 4 leg tips contact the ground at the same time.  Hold onto the walker for support and  step forward with your weaker leg into the middle of the walker. Follow the weight bearing instructions your caregiver has given you.  Push down with your hands and step forward with your stronger leg.  Be careful not to let the walker get too far ahead of you as you walk.  Repeat the process for each step. HOW TO USE A FRONT-WHEELED WALKER  Slide your walker forward. The back legs of the walker should be no further ahead than your toes. You should not feel like you need to lean forward to keep your hands on the grips.  Hold onto the walker for support and step forward with your weaker leg into the middle of the walker. Follow the weight bearing  instructions your caregiver has given you.  Push down with your hands and step forward with your stronger leg.  Be careful not to let the walker get too far ahead of you as you walk.  Repeat the process for each step.  If your walker does not glide well over carpet, consider cutting an "x" in 2 old tennis balls and placing them over the back legs of your walker. STANDING UP FROM A CHAIR WITH ARMRESTS  It is best to sit in a firm chair with armrests.  Position your walker directly in front of your chair. Do not pull on the walker when standing up. It is too unstable to support weight when pulled on.  Slide forward in the chair, with your weaker leg ahead and stronger leg bent near the chair.  Lean forward and push up from your chair with both hands on the armrests. Straighten your stronger leg, rising to standing. Do not pull yourself up from the walker. This may cause it to tip.  When you feel steady on your feet, carefully move one hand at a time to the walker.  Stand for a few seconds to stabilize your balance before you start to walk. STANDING UP FROM A CHAIR WITHOUT ARMRESTS  It is best to sit in a firm chair. A low seat or an overstuffed chair or sofa is hard to get out of.  Place the walker in front of you. Do not pull on the walker when coming to a standing position.  Slide forward in the chair, with your weaker leg ahead and stronger leg bent near the chair.  Push down on the chair seat with the hand opposite your weaker leg. Keep your other hand on the center of the walker's crossbar.  Stand, steady your balance, and place your hands on the walker handgrips. SITTING DOWN  Always back up toward your chair, using your walker, until you feel the back of your legs touch the chair.  If the chair has armrests, carefully reach back to put your hands on the armrests, and slowly lower your weight.  If the chair does not have armrests, consider backing up to the side of the  chair. You can then hold onto the back of the chair and the front of the seat to slowly lower yourself.  You should never feel like you are falling into your chair. USING A WALKER ON STEPS   Before attempting to use your walker on steps, practice with your physical therapist.  If you are going up a step wide enough to accommodate the entire walker and yourself:  First, place the walker up on the step.  Second, get your feet as close to the step as you can.  Third, press down on the  walker with your hands as you step up with your stronger leg. Then step up with your weaker leg.  If you are going down a step wide enough to accommodate the entire walker and yourself:  First, place the walker down on the step.  Second, hold onto the walker as you step down with your weaker leg. Then step down with your stronger leg.  If you are going up more than 1 step and have a railing:  First, turn the walker sideways, so the opening is facing in toward you.  Second, place the front 2 legs of the walker on the first step. These front legs should be positioned at the base of the next step.  Third, test the steadiness of the walker. It should feel sturdy when you press down on the handgrip that is facing the top of the steps.  Finally, placing your weight on the railing and the walker, step up with your stronger leg first. Then step up with your weaker leg.  If you are going down more than 1 step and have a railing:  First, turn the walker sideways, so the opening is facing in toward you.  Second, place the front 2 legs of the walker down on the first step. When possible, the back legs of the walker should be positioned at the base of the previous step.  Third, test the steadiness of the walker. It should feel sturdy when you press down on the handgrip that is facing the top of the steps.  Finally, placing your weight on the railing and the walker, step down with your weaker leg first. Then step  down with your stronger leg.  Be sure to check the sturdiness of the walker before each step.  Make sure you have good rubber tips on the legs of your walker to prevent it from slipping.   This information is not intended to replace advice given to you by your health care provider. Make sure you discuss any questions you have with your health care provider.   Document Released: 12/14/2005 Document Revised: 03/07/2012 Document Reviewed: 06/28/2015 Elsevier Interactive Patient Education 2016 [place] are compounds that affect the level of uric acid in your body. A low-purine diet is a diet that is low in purines. Eating a low-purine diet can prevent the level of uric acid in your body from getting too high and causing gout or kidney stones or both. WHAT DO I NEED TO KNOW ABOUT THIS DIET?  Choose low-purine foods. Examples of low-purine foods are listed in the next section.  Drink plenty of fluids, especially water. Fluids can help remove uric acid from your body. Try to drink 8-16 cups (1.9-3.8 L) a day.  Limit foods high in fat, especially saturated fat, as fat makes it harder for the body to get rid of uric acid. Foods high in saturated fat include pizza, cheese, ice cream, whole milk, fried foods, and gravies. Choose foods that are lower in fat and lean sources of protein. Use olive oil when cooking as it contains healthy fats that are not high in saturated fat.  Limit alcohol. Alcohol interferes with the elimination of uric acid from your body. If you are having a gout attack, avoid all alcohol.  Keep in mind that different people's bodies react differently to different foods. You will probably learn over time which foods do or do not affect you. If you discover that a food tends to cause your gout to  flare up, avoid eating that food. You can more freely enjoy foods that do not cause problems. If you have any questions about a food item, talk to your dietitian  or health care provider. WHICH FOODS ARE LOW, MODERATE, AND HIGH IN PURINES? The following is a list of foods that are low, moderate, and high in purines. You can eat any amount of the foods that are low in purines. You may be able to have small amounts of foods that are moderate in purines. Ask your health care provider how much of a food moderate in purines you can have. Avoid foods high in purines. Grains  Foods low in purines: Enriched white bread, pasta, rice, cake, cornbread, popcorn.  Foods moderate in purines: Whole-grain breads and cereals, wheat germ, bran, oatmeal. Uncooked oatmeal. Dry wheat bran or wheat germ.  Foods high in purines: Pancakes, [place] toast, biscuits, muffins. Vegetables  Foods low in purines: All vegetables, except those that are moderate in purines.  Foods moderate in purines: Asparagus, cauliflower, spinach, mushrooms, green peas. Fruits  All fruits are low in purines. Meats and other Protein Foods  Foods low in purines: Eggs, nuts, peanut butter.  Foods moderate in purines: 80-90% lean beef, lamb, veal, pork, poultry, fish, eggs, peanut butter, nuts. Crab, lobster, oysters, and shrimp. Cooked dried beans, peas, and lentils.  Foods high in purines: Anchovies, sardines, herring, mussels, tuna, codfish, scallops, trout, and haddock. [place] Salines. Organ meats (such as liver or kidney). [place]. Game meat. Goose. Sweetbreads. Dairy  All dairy foods are low in purines. Low-fat and fat-free dairy products are best because they are low in saturated fat. Beverages  Drinks low in purines: Water, carbonated beverages, tea, coffee, cocoa.  Drinks moderate in purines: Soft drinks and other drinks sweetened with high-fructose corn syrup. Juices. To find whether a food or drink is sweetened with high-fructose corn syrup, look at the ingredients list.  Drinks high in purines: Alcoholic beverages (such as beer). Condiments  Foods low in purines: Salt, herbs, olives,  pickles, relishes, vinegar.  Foods moderate in purines: Butter, margarine, oils, mayonnaise. Fats and Oils  Foods low in purines: All types, except gravies and sauces made with meat.  Foods high in purines: Gravies and sauces made with meat. Other Foods  Foods low in purines: Sugars, sweets, gelatin. Cake. Soups made without meat.  Foods moderate in purines: Meat-based or fish-based soups, broths, or bouillons. Foods and drinks sweetened with high-fructose corn syrup.  Foods high in purines: High-fat desserts (such as ice cream, cookies, cakes, pies, doughnuts, and chocolate). Contact your dietitian for more information on foods that are not listed here.   This information is not intended to replace advice given to you by your health care provider. Make sure you discuss any questions you have with your health care provider.   Document Released: 04/10/2011 Document Revised: 12/19/2013 Document Reviewed: 11/20/2013 Elsevier Interactive Patient Education [employer].

## 2016-10-02 NOTE — Progress Notes (Signed)
Inpatient Diabetes Program Recommendations  AACE/ADA: New Consensus Statement on Inpatient Glycemic Control (2015)  Target Ranges:  Prepandial:   less than 140 mg/dL      Peak postprandial:   less than 180 mg/dL (1-2 hours)      Critically ill patients:  140 - 180 mg/dL   Results for Patrick Stewart, Patrick Stewart (MRN [id number]) as of 10/02/2016 13:42  Ref. Range 10/01/2016 12:26 10/01/2016 16:47 10/01/2016 21:03 10/02/2016 08:25 10/02/2016 12:15  Glucose-Capillary Latest Ref Range: 65 - 99 mg/dL 207 (H) 169 (H) 197 (H) 230 (H) 184 (H)    Review of Glycemic Control  Current orders for Inpatient glycemic control: Novolog 0-9 units [facility]  Inpatient Diabetes Program Recommendations: Please consider:  Adding Novolog 0-5 units QHS;  Lantus 12 units daily.  May benefit from follow up with PCP to adjust home management of long-term blood glucose control (A1C = 9.3%).  Thank you,  Windy Carina, RN, BSN Diabetes Coordinator Inpatient Diabetes Program [phone] (Team Pager) [phone] (AP office) [phone] [facility] office) [phone] [facility] office)

## 2016-10-02 NOTE — Discharge Summary (Signed)
Physician Discharge Summary  Patrick Stewart GEX:[id number] DOB: 08/01/1956 DOA: 09/29/2016  PCP: Patrick Pal, DO  Admit date: 09/29/2016 Discharge date: 10/02/2016  Time spent: 35 minutes  Recommendations for Outpatient Follow-up:  1. Repeat BMET to follow electrolytes and renal function  2. Follow uric acid level and if needed initiate treatment with uloric (goal is for level < 7) 3. Reassess and follow closely his CBG's diabetes; adjust hypoglycemic regimen as needed   Discharge Diagnoses:  Principal Problem:   SIRS (systemic inflammatory response syndrome) (HCC) Active Problems:   CKD (chronic kidney disease)   Idiopathic gout of left knee   Cardiomyopathy, ischemic, chronic systolic CHF   Essential hypertension   Lipid disorder   COPD with chronic bronchitis ([place])   Diabetes mellitus type 2, uncontrolled ([place])   Discharge Condition: stable and improved. Discharge home with instructions to follow up with PCP in 2 weeks and with orthopedic service in 1 week  Diet recommendation: low purine diet, modified carbohydrates and heart healthy diet   Filed Weights   09/30/16 0037 09/30/16 0050  Weight: 87.9 kg (193 lb 12.6 oz) 87.4 kg (192 lb 10.9 oz)    History of present illness:  60 y.o.malewith medical history significant of CAD s/o CABG, DM, HTN, COPD and gout presenting with knee pain for about 3 weeks. Comes and goes. Got severe over the last week. Left knee pain. Feels "like it ain't nothing down there." A little bit numb. Pain up to 9/10 at its worst, never goes away completely. At its best, 5-6/10. Has been having gout attacks since he was young. Nothing makes the pain better, "the inflammation what's making it swell. Quite nice if they give me oxycodone but it's not doing what it's supposed to." Pain gets better but then comes back. Was scheduled to see Dr. Ninfa Linden for this problem on Monday but "I couldn't get up to him" because he couldn't walk.    Hospital Course:  Sepsis has been R/O: SIRS due to Gout -Initial call was for inadequately controlled gout pain with failure of outpatient management. -However, the patient had decompensated somewhat by the time he was evaluated by my colleague; he was febrile, with tachypnea and tachycardia. Reason why he was admitted with presumed for sepsis vs SIRS. -Sepsis was rule out. Everything appears to be SIRS reaction from his Gout. -BP stable to high, demonstrating hemodynamically stability -Lactate normal -he previously did have joint aspiration and culture has remained negative up to date -Blood and urine cx's w/o growth -Broad spectrum coverage with Vanc/Zosyn discontinued and patient remains afebrile and hemodynamically stable for 36 hours off antibiotics prior to discharge. -orthopedic consulted; appreciate assistance, recommendations and inputs. -septic features resolved  -will discharge home with treatment for gout.  Gout -His uric acid level is elevated; goal is for uric acid < 7 -will titrate allopurinol to 300mg  and start colchicine 0.6mg  BID -orthopedic service consulted; agree with presentation fitting gout and no septic arthritis. -patient received steroidal joint shot on 10/01/16 -will follow up with orthopedic service in 1 week -per Pt rec's ok to discharge home with rolling walker to assist with mobility and intermittent supervision -low purine diet encouraged  -advise to follow adequate hydration   CKD: stage 3 at baseline  -Appears to be stable at this time -will recommend BMET at follow up visit to monitor renal function trend   Ischemic cardiomyopathy with CHF -Currently appears to be compensated -Echo in 10/16 showed EF 30-35%; this may need repeating as  an outpatient but currently this doesn't appear to be an acute issue for him -He reports h/o murmur; which was appreciated on my exam -will continue lasix, lisinopril and coreg as per home regimen -advise to  follow low sodium diet and to check his weight on daily bases   DM with nephropathy  -modified carbohydrates diet has been encouraged -will resume home hypoglycemic regimen   HTN -Continue Coreg, Nifedipine, Lisinopril -advise to follow low sodium diet   HLD -Continue Lipitor  COPD -Continue home Albuterol nebs as needed  Procedures:  See below for x-ray reports  Steroidal knee injection (Left knee; 10/05)  Consultations:  Orthopedic service (Dr. Ninfa Linden)  Discharge Exam: Vitals:   10/01/16 2102 10/02/16 1010  BP: 130/77 138/85  Pulse: 72 85  Resp: 18   Temp: 97.8 F (36.6 C)     General:  Afebrile, in no distress currently. Reports pain in his left knee and decrease ROM (even he reported some improvement).   Cardiovascular: S1 and S2, no rubs, no gallops; positive SEM  Respiratory: CTA bilaterally   Abdomen: soft, NT, ND, positive BS  Musculoskeletal: no cyanosis or clubbing. Patient left knee with decrease ROM, some tenderness with deep palpation and movement (especially flexion); mild swelling appreciated, no warmness appreciated on exam and no erythema seen.Marland Kitchen   Discharge Instructions   Discharge Instructions    (HEART FAILURE PATIENTS) Call MD:  Anytime you have any of the following symptoms: 1) 3 pound weight gain in 24 hours or 5 pounds in 1 week 2) shortness of breath, with or without a dry hacking cough 3) swelling in the hands, feet or stomach 4) if you have to sleep on extra pillows at night in order to breathe.    Complete by:  As directed    Diet - low sodium heart healthy    Complete by:  As directed    Discharge instructions    Complete by:  As directed    Maintain adequate hydration  Follow a low carbohydrates and heart healthy diet (low sodium < 2 gram daily) Check your weight on daily basis  Follow up with PCP in 2 weeks Follow up with Dr. Ninfa Linden in 1 week Keep yourself active and moving   Increase activity slowly    Complete by:   As directed      Current Discharge Medication List    START taking these medications   Details  docusate sodium (COLACE) 100 MG capsule Take 1 capsule (100 mg total) by mouth 2 (two) times daily. Qty: 60 capsule, Refills: 0    Oxycodone HCl 10 MG TABS Take 1 tablet (10 mg total) by mouth every 6 (six) hours as needed. For severe pain Qty: 30 tablet, Refills: 0    polyethylene glycol (MIRALAX / GLYCOLAX) packet Take 17 g by mouth daily. Qty: 28 each, Refills: 0      CONTINUE these medications which have CHANGED   Details  allopurinol (ZYLOPRIM) 300 MG tablet Take 1 tablet (300 mg total) by mouth daily. Qty: 30 tablet, Refills: 1   Associated Diagnoses: Gout of right knee, unspecified cause, unspecified chronicity    colchicine 0.6 MG tablet Take 1 tablet (0.6 mg total) by mouth 2 (two) times daily. Qty: 60 tablet, Refills: 0      CONTINUE these medications which have NOT CHANGED   Details  albuterol (PROVENTIL) (2.5 MG/3ML) 0.083% nebulizer solution Take 3 mLs (2.5 mg total) by nebulization every 6 (six) hours as needed for wheezing or  shortness of breath. Qty: 75 mL, Refills: 3   Associated Diagnoses: COPD with chronic bronchitis (HCC)    atorvastatin (LIPITOR) 80 MG tablet Take 1 tablet (80 mg total) by mouth daily. Qty: 30 tablet, Refills: 11   Associated Diagnoses: Coronary artery disease involving native coronary artery of native heart with angina pectoris (HCC)    carvedilol (COREG) 6.25 MG tablet Take 6.25 mg by mouth 2 (two) times daily.    famotidine (PEPCID) 20 MG tablet Take 1 tablet (20 mg total) by mouth 2 (two) times daily. Qty: 30 tablet, Refills: 0    furosemide (LASIX) 20 MG tablet Take 60 mg by mouth every morning.    glucose blood test strip Use as instructed Qty: 100 each, Refills: 3   Associated Diagnoses: Type 2 diabetes mellitus with chronic kidney disease, without long-term current use of insulin, unspecified CKD stage (HCC)    Lancets  (FREESTYLE) lancets Use to check sugars daily Qty: 100 each, Refills: 3   Associated Diagnoses: Type 2 diabetes mellitus with chronic kidney disease, without long-term current use of insulin, unspecified CKD stage (HCC)    lisinopril (PRINIVIL,ZESTRIL) 40 MG tablet Take 40 mg by mouth every morning.  Refills: 11    metFORMIN (GLUCOPHAGE) 1000 MG tablet Take 1,000 mg by mouth 2 (two) times daily.    NIFEdipine (PROCARDIA-XL/ADALAT CC) 60 MG 24 hr tablet Take 1 tablet (60 mg total) by mouth daily. Qty: 30 tablet, Refills: 1   Associated Diagnoses: Essential hypertension    methocarbamol (ROBAXIN) 500 MG tablet Take 2 tablets (1,000 mg total) by mouth every 8 (eight) hours as needed for muscle spasms. Qty: 30 tablet, Refills: 0      STOP taking these medications     oxyCODONE-acetaminophen (PERCOCET/ROXICET) 5-325 MG tablet        Allergies  Allergen Reactions  . Hydrocodone Itching  . Tramadol Nausea Only   Follow-up Information    Mcarthur Rossetti, MD. Schedule an appointment as soon as possible for a visit today.   Specialty:  Orthopedic Surgery Contact information: [place] [address] [phone]        Patrick Pal, DO Follow up in 2 week(s).   Specialty:  Family Medicine Contact information: [place] [address] [phone]           The results of significant diagnostics from this hospitalization (including imaging, microbiology, ancillary and laboratory) are listed below for reference.    Significant Diagnostic Studies: Dg Chest 2 View  Result Date: 09/30/2016 CLINICAL DATA:  60 year old male with shortness of breath. EXAM: CHEST  2 VIEW COMPARISON:  Chest radiograph dated 09/25/2016 FINDINGS: Two views of the chest demonstrates stable cardiomegaly. Median sternotomy wires and CABG vascular clips noted. Mild central vascular prominence may represent mild vascular congestion. No  pulmonary edema. There is no focal consolidation, pleural effusion, or pneumothorax. No acute osseous pathology. IMPRESSION: Stable cardiomegaly with possible mild congestive changes. No focal consolidation or pulmonary edema. Electronically Signed   By: Anner Crete M.D.   On: 09/30/2016 00:51   Dg Chest 2 View  Result Date: 09/25/2016 CLINICAL DATA:  Right knee swelling and redness with concern for infection. Hypertension. EXAM: CHEST  2 VIEW COMPARISON:  August 20, 2016 FINDINGS: There is no edema or consolidation. There is stable cardiomegaly. Pulmonary vascularity is within normal limits. Patient is status post coronary artery bypass grafting. No adenopathy. No bone lesions. There is postoperative change in the  right shoulder. IMPRESSION: Stable cardiomegaly. No edema or consolidation. Status post coronary artery bypass grafting. Electronically Signed   By: Lowella Grip III M.D.   On: 09/25/2016 13:12   Dg Tibia/fibula Left  Result Date: 09/23/2016 CLINICAL DATA:  Lateral LEFT knee pain for 1 day. Warmth and swelling. History of vein stripping for CABG. EXAM: LEFT TIBIA AND FIBULA - 2 VIEW COMPARISON:  None. FINDINGS: There is no evidence of fracture or other focal bone lesions. Included view of the knee demonstrates suspected suprapatellar joint effusion. Vascular clips within medial knee soft tissues . No subcutaneous gas or radiopaque foreign bodies. IMPRESSION: Partially imaged probable suprapatellar joint effusion would be better characterized on dedicated knee radiographs. No acute osseous process. Electronically Signed   By: Elon Alas M.D.   On: 09/23/2016 04:59   Dg Knee Complete 4 Views Left  Result Date: 09/29/2016 CLINICAL DATA:  Left knee pain and swelling for 2 weeks. EXAM: LEFT KNEE - COMPLETE 4+ VIEW COMPARISON:  None. FINDINGS: No acute fracture or dislocation. No lytic or sclerotic osseous lesion. Small joint effusion. Surgical clips in the posterior distal thigh.  IMPRESSION: No acute osseous injury of the left knee. Electronically Signed   By: Kathreen Devoid   On: 09/29/2016 19:57   Dg Hand Complete Left  Result Date: 09/11/2016 CLINICAL DATA:  Bilateral hand pain for approximately 2 months, centered about the joints. No known injury. History of gout. Subsequent encounter. EXAM: LEFT HAND - COMPLETE 3+ VIEW COMPARISON:  None. FINDINGS: No acute bony or joint abnormality is identified. An erosion with an overhanging edge is seen in the head of the first metacarpal. No other erosion is identified. Joint spaces are maintained. No fracture or dislocation. Soft tissues are unremarkable. IMPRESSION: No acute abnormality. Erosion at the first carpometacarpal joint has appearance most typical of gout. Electronically Signed   By: Inge Rise M.D.   On: 09/11/2016 12:55   Dg Hand Complete Right  Result Date: 09/11/2016 CLINICAL DATA:  Bilateral hand pain for approximately 2 months, centered about the joints. No known injury. History of gout Subsequent encounter. EXAM: RIGHT HAND - COMPLETE 3+ VIEW COMPARISON:  Plain films right hand 04/24/2016. FINDINGS: No acute bony or joint abnormality is identified. Again seen is marked crowding of the carpal bones with multiple erosions identified. The MCP and interphalangeal joints are spared. Soft tissues are unremarkable. IMPRESSION: Marked carpal joint space narrowing with innumerable erosions could be due to inflammatory arthropathy such as rheumatoid or crystal arthropathy such as gout. No acute abnormality. Electronically Signed   By: Inge Rise M.D.   On: 09/11/2016 12:52    Microbiology: Recent Results (from the past 240 hour(s))  Culture, blood (Routine x 2)     Status: None   Collection Time: 09/25/16 12:04 PM  Result Value Ref Range Status   Specimen Description BLOOD LEFT FOREARM  Final   Special Requests   Final    BOTTLES DRAWN AEROBIC AND ANAEROBIC BLUE 5CC, RED 4CC   Culture   Final    NO GROWTH 5  DAYS Performed at [facility]    Report Status 09/30/2016 FINAL  Final  Culture, body fluid-bottle     Status: None   Collection Time: 09/25/16 12:38 PM  Result Value Ref Range Status   Specimen Description SYNOVIAL LEFT KNEE  Final   Special Requests NONE  Final   Culture   Final    NO GROWTH 5 DAYS Performed at [facility]  Report Status 09/30/2016 FINAL  Final  Gram stain     Status: None   Collection Time: 09/25/16 12:38 PM  Result Value Ref Range Status   Specimen Description SYNOVIAL LEFT KNEE  Final   Special Requests NONE  Final   Gram Stain   Final    MODERATE WBC PRESENT, PREDOMINANTLY PMN NO ORGANISMS SEEN Performed at [facility]    Report Status 09/26/2016 FINAL  Final  Urine culture     Status: None   Collection Time: 09/25/16  1:52 PM  Result Value Ref Range Status   Specimen Description URINE, CLEAN CATCH  Final   Special Requests NONE  Final   Culture NO GROWTH Performed at [facility]   Final   Report Status 09/26/2016 FINAL  Final  Culture, blood (Routine X 2) w Reflex to ID Panel     Status: None (Preliminary result)   Collection Time: 09/30/16 12:01 AM  Result Value Ref Range Status   Specimen Description BLOOD LEFT WRIST  Final   Special Requests BOTTLES DRAWN AEROBIC AND ANAEROBIC 5CC  Final   Culture   Final    NO GROWTH 2 DAYS Performed at [facility]    Report Status PENDING  Incomplete  Culture, blood (Routine X 2) w Reflex to ID Panel     Status: None (Preliminary result)   Collection Time: 09/30/16 12:01 AM  Result Value Ref Range Status   Specimen Description BLOOD RIGHT WRIST  Final   Special Requests IN PEDIATRIC BOTTLE [place]  Final   Culture   Final    NO GROWTH 2 DAYS Performed at [facility]    Report Status PENDING  Incomplete  MRSA PCR Screening     Status: None   Collection Time: 09/30/16  8:36 AM  Result Value Ref Range Status   MRSA by PCR NEGATIVE NEGATIVE Final     Comment:        The GeneXpert MRSA Assay (FDA approved for NASAL specimens only), is one component of a comprehensive MRSA colonization surveillance program. It is not intended to diagnose MRSA infection nor to guide or monitor treatment for MRSA infections.      Labs: Basic Metabolic Panel:  Recent Labs Lab 09/29/16 2040 09/30/16 0139  NA 136 134*  K 4.0 4.3  CL 104 102  CO2 21* 21*  GLUCOSE 169* 307*  BUN 15 18  CREATININE 1.29* 1.55*  CALCIUM 8.9 8.7*   CBC:  Recent Labs Lab 09/29/16 2040 09/30/16 0139  WBC 7.9 9.9  NEUTROABS 5.9  --   HGB 11.5* 11.0*  HCT 33.9* 33.5*  MCV 88.7 92.0  PLT 259 256   BNP (last 3 results)  Recent Labs  08/20/16 0455  BNP 766.2*   CBG:  Recent Labs Lab 10/01/16 1226 10/01/16 1647 10/01/16 2103 10/02/16 0825 10/02/16 1215  GLUCAP 207* 169* 197* 230* 184*    Signed:  Barton Dubois MD.  [facility] 10/02/2016, 3:00 PM

## 2016-10-05 LAB — CULTURE, BLOOD (ROUTINE X 2)
CULTURE: NO GROWTH
Culture: NO GROWTH

## 2016-10-17 IMAGING — CR DG FINGER MIDDLE 2+V*R*
3 series · 3 of 3 positions shown · non-contrast
Comparison: None.

CLINICAL DATA: Soft tissue swelling for several days

EXAM:
RIGHT THIRD FINGER 2+V

[x finger pa right]
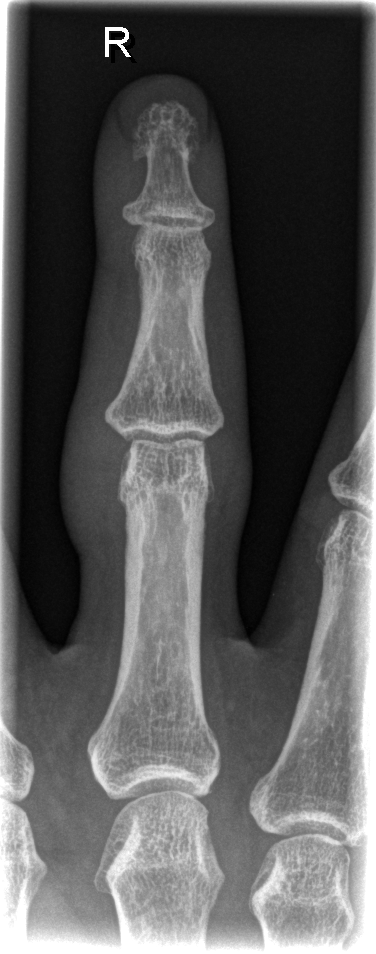

[x finger obl. right]
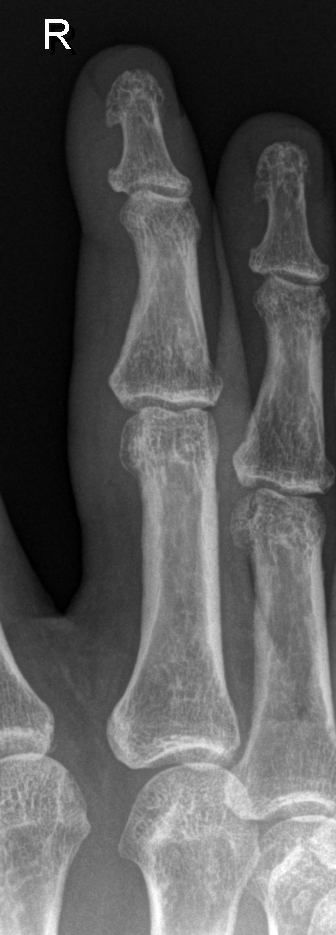

[x finger lateral right]
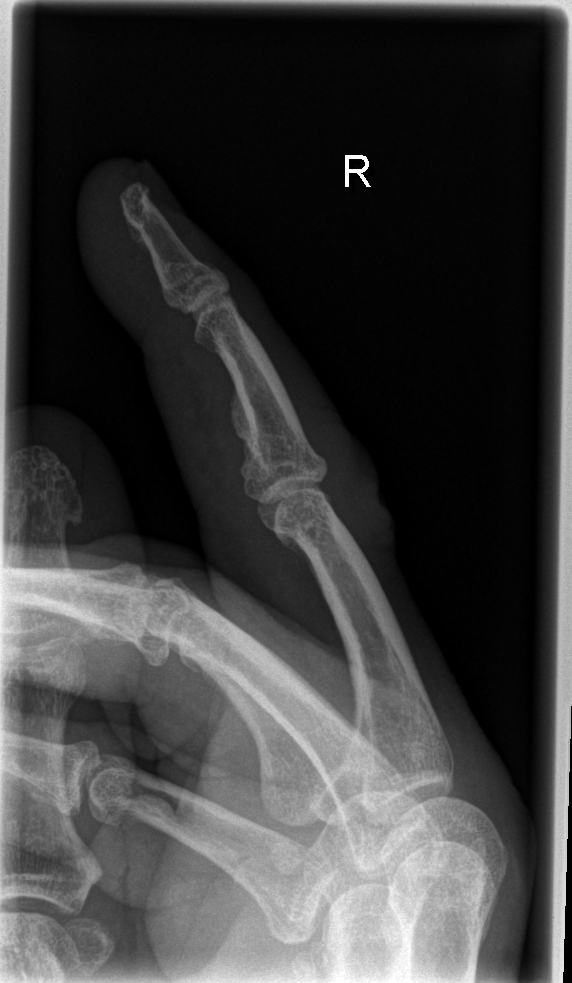

[3 of 3 positions shown; findings below may reference images not displayed]

FINDINGS: Frontal, oblique, and lateral views were obtained. There is focal
soft tissue swelling in the region of the third PIP joint. There is
no fracture or dislocation. Joint spaces appear intact. No erosive
change or periostitis. No radiopaque foreign body.
IMPRESSION: Soft tissue swelling at the level of the third PIP joint. No bony or
joint space abnormality appreciable.

## 2016-11-27 ENCOUNTER — Telehealth: Payer: Self-pay | Admitting: Family Medicine

## 2016-11-27 NOTE — Telephone Encounter (Signed)
Please advise if ok to fill. Pt was seen in ED on 09/29/16 at ED and has not follow up with [place] yet but requesting refill.TL/CMA

## 2016-11-27 NOTE — Telephone Encounter (Signed)
I need to see him. He was supposed to follow up, but never did. I need to keep an eye on his renal function with this one. Please schedule him. TY.

## 2016-11-27 NOTE — Telephone Encounter (Signed)
Relation to DL:OPRA Call back [phone] Pharmacy: [id number] - [place], [place] [phone] (Phone) [phone] (Fax    Reason for call:  Patient requesting a refill colchicine 0.6 MG tablet

## 2016-11-30 NOTE — Telephone Encounter (Signed)
Attempted to contact patient LVMOM for return call. TL/CMA

## 2016-11-30 NOTE — Telephone Encounter (Signed)
Patient scheduled for Wednesday

## 2016-12-02 ENCOUNTER — Ambulatory Visit: Payer: Medicare Other | Admitting: Family Medicine

## 2016-12-08 ENCOUNTER — Emergency Department (HOSPITAL_BASED_OUTPATIENT_CLINIC_OR_DEPARTMENT_OTHER)
Admission: EM | Admit: 2016-12-08 | Discharge: 2016-12-08 | Disposition: A | Discharge status: Home / Self Care | Payer: Medicare Other | Attending: Emergency Medicine | Admitting: Emergency Medicine

## 2016-12-08 ENCOUNTER — Encounter (HOSPITAL_BASED_OUTPATIENT_CLINIC_OR_DEPARTMENT_OTHER): Payer: Self-pay

## 2016-12-08 DIAGNOSIS — I130 Hypertensive heart and chronic kidney disease with heart failure and stage 1 through stage 4 chronic kidney disease, or unspecified chronic kidney disease: Secondary | ICD-10-CM | POA: Insufficient documentation

## 2016-12-08 DIAGNOSIS — N189 Chronic kidney disease, unspecified: Secondary | ICD-10-CM | POA: Diagnosis not present

## 2016-12-08 DIAGNOSIS — Z7984 Long term (current) use of oral hypoglycemic drugs: Secondary | ICD-10-CM | POA: Diagnosis not present

## 2016-12-08 DIAGNOSIS — E1122 Type 2 diabetes mellitus with diabetic chronic kidney disease: Secondary | ICD-10-CM | POA: Insufficient documentation

## 2016-12-08 DIAGNOSIS — M79641 Pain in right hand: Secondary | ICD-10-CM | POA: Insufficient documentation

## 2016-12-08 DIAGNOSIS — Z87891 Personal history of nicotine dependence: Secondary | ICD-10-CM | POA: Insufficient documentation

## 2016-12-08 DIAGNOSIS — I509 Heart failure, unspecified: Secondary | ICD-10-CM | POA: Insufficient documentation

## 2016-12-08 DIAGNOSIS — Z79899 Other long term (current) drug therapy: Secondary | ICD-10-CM | POA: Insufficient documentation

## 2016-12-08 DIAGNOSIS — J449 Chronic obstructive pulmonary disease, unspecified: Secondary | ICD-10-CM | POA: Diagnosis not present

## 2016-12-08 DIAGNOSIS — I2510 Atherosclerotic heart disease of native coronary artery without angina pectoris: Secondary | ICD-10-CM | POA: Insufficient documentation

## 2016-12-08 LAB — BASIC METABOLIC PANEL
Anion gap: 7 (ref 5–15)
BUN: 22 mg/dL — AB (ref 6–20)
CALCIUM: 8.9 mg/dL (ref 8.9–10.3)
CO2: 25 mmol/L (ref 22–32)
CREATININE: 1.34 mg/dL — AB (ref 0.61–1.24)
Chloride: 105 mmol/L (ref 101–111)
GFR calc non Af Amer: 56 mL/min — ABNORMAL LOW (ref >60)
Glucose, Bld: 168 mg/dL — ABNORMAL HIGH (ref 65–99)
Potassium: 3.4 mmol/L — ABNORMAL LOW (ref 3.5–5.1)
SODIUM: 137 mmol/L (ref 135–145)

## 2016-12-08 MED ORDER — COLCHICINE 0.6 MG PO TABS
0.6000 mg | ORAL_TABLET | Freq: Once | ORAL | Status: AC
  Administered 2016-12-08: 0.6 mg via ORAL
  Filled 2016-12-08: qty 1

## 2016-12-08 MED ORDER — COLCHICINE 0.6 MG PO TABS: 0.6000 mg | ORAL_TABLET | Freq: Two times a day (BID) | ORAL | 0 refills | Status: DC

## 2016-12-08 MED ORDER — PREDNISONE 20 MG PO TABS: 40.0000 mg | ORAL_TABLET | Freq: Every day | ORAL | 0 refills | Status: DC

## 2016-12-08 MED ORDER — DEXAMETHASONE SODIUM PHOSPHATE 10 MG/ML IJ SOLN
10.0000 mg | Freq: Once | INTRAMUSCULAR | Status: AC
  Administered 2016-12-08: 10 mg via INTRAMUSCULAR
  Filled 2016-12-08: qty 1

## 2016-12-08 MED ORDER — ACETAMINOPHEN 500 MG PO TABS
1000.0000 mg | ORAL_TABLET | Freq: Once | ORAL | Status: AC
  Administered 2016-12-08: 1000 mg via ORAL
  Filled 2016-12-08: qty 2

## 2016-12-08 NOTE — ED Notes (Signed)
Pt verbalizes understanding of dc instructions.  He is encouraged to follow up with his PCP as soon as he can.  Pt states "it is hard to get in there."  Pt advised to call first thing in the morning and schedule next appointment so he can obtain refills of the rest of his regular medicines as well as follow up from his admission back in October.  Pt denies any further needs at this time.

## 2016-12-08 NOTE — ED Provider Notes (Signed)
[facility] DEPT [facility] Provider Note   CSN: [id number] Arrival date & time: 12/08/16  0335     History   Chief Complaint Chief Complaint  Patient presents with  . Hand Pain    HPI Patrick Stewart is a 60 y.o. male.  HPI Patient presents with concern of right hand pain. Patient acknowledges a long history of pain in multiple joints. Seems as though over the past few hours, since about 2 AM, the patient has had increasing pain in his right hand. He notes that since an admission to our affiliated facility 2 months ago he has not seen his doctor, has run out of his colchicine. He continues to take allopurinol. No new fall, trauma, accident. However, the patient has had increasing pain in the right hand over the past few hours, and presents for evaluation. He denies other new painful areas, new fever, chills, nausea, vomiting, other complaints. He states that he continues take all of the medication including antihypertensive as directed. Patient states that he was supposed to see his physician last week, but did not do so.   Past Medical History:  Diagnosis Date  . CAD (coronary artery disease) 2010   3v CABG  . COPD with chronic bronchitis ([place]) 08/26/2016  . Diabetes mellitus type 2, uncontrolled ([place]) 09/11/2016  . Gout   . Hypertension     Patient Active Problem List   Diagnosis Date Noted  . SIRS (systemic inflammatory response syndrome) ([place]) 09/30/2016  . Diabetes mellitus type 2, uncontrolled ([place]) 09/11/2016  . COPD with chronic bronchitis ([place]) 08/26/2016  . Non-ST elevation MI (NSTEMI) ([place]) 07/08/2016  . Kidney stone on right side 10/25/2015  . CKD (chronic kidney disease) 10/13/2015  . Idiopathic gout of left knee 10/13/2015  . Accelerated hypertension 10/13/2015  . Abnormal EKG 10/13/2015  . CAD Status post CABG 2 SVG to OM 1, LIMA to mid LAD: 10/13/2015  . Cardiomyopathy, ischemic, chronic systolic CHF 54/62/7035  . Acute pericarditis 05/05/2015  .  Essential hypertension 09/04/2014  . Benign prostatic hypertrophy with lower urinary tract symptoms (LUTS) 01/24/2013  . Peyronie's disease 12/18/2012  . Chronic joint pain 06/22/2012  . Congestive heart failure ([place]) 01/26/2012  . History of tobacco abuse 01/26/2012  . Inguinal hernia, right 01/26/2012  . Lipid disorder 01/26/2012    Past Surgical History:  Procedure Laterality Date  . CORONARY ARTERY BYPASS GRAFT  2010  . KNEE SURGERY    . ROTATOR CUFF REPAIR    . WRIST SURGERY         Home Medications    Prior to Admission medications   Medication Sig Start Date End Date Taking? Authorizing Provider  albuterol (PROVENTIL) (2.5 MG/3ML) 0.083% nebulizer solution Take 3 mLs (2.5 mg total) by nebulization every 6 (six) hours as needed for wheezing or shortness of breath. 08/26/16   Shelda Pal, DO  allopurinol (ZYLOPRIM) 300 MG tablet Take 1 tablet (300 mg total) by mouth daily. 10/02/16   Barton Dubois, MD  atorvastatin (LIPITOR) 80 MG tablet Take 1 tablet (80 mg total) by mouth daily. Patient taking differently: Take 80 mg by mouth every morning.  08/26/16   Shelda Pal, DO  carvedilol (COREG) 6.25 MG tablet Take 6.25 mg by mouth 2 (two) times daily. 10/25/15   Historical Provider, MD  colchicine 0.6 MG tablet Take 1 tablet (0.6 mg total) by mouth 2 (two) times daily. 10/02/16   Barton Dubois, MD  docusate sodium (COLACE) 100 MG capsule Take 1 capsule (100  mg total) by mouth 2 (two) times daily. 10/02/16   Barton Dubois, MD  famotidine (PEPCID) 20 MG tablet Take 1 tablet (20 mg total) by mouth 2 (two) times daily. Patient taking differently: Take 20 mg by mouth 2 (two) times daily as needed for heartburn or indigestion.  04/29/16   Dorie Rank, MD  furosemide (LASIX) 20 MG tablet Take 60 mg by mouth every morning.    Historical Provider, MD  glucose blood test strip Use as instructed 08/26/16   Shelda Pal, DO  Lancets (FREESTYLE) lancets Use to check  sugars daily 08/26/16   Crosby Oyster Wendling, DO  lisinopril (PRINIVIL,ZESTRIL) 40 MG tablet Take 40 mg by mouth every morning.  03/27/16   Historical Provider, MD  metFORMIN (GLUCOPHAGE) 1000 MG tablet Take 1,000 mg by mouth 2 (two) times daily. 10/25/15   Historical Provider, MD  methocarbamol (ROBAXIN) 500 MG tablet Take 2 tablets (1,000 mg total) by mouth every 8 (eight) hours as needed for muscle spasms. Patient not taking: Reported on 09/29/2016 04/29/16   Dorie Rank, MD  NIFEdipine (PROCARDIA-XL/ADALAT CC) 60 MG 24 hr tablet Take 1 tablet (60 mg total) by mouth daily. Patient taking differently: Take 60 mg by mouth every morning.  08/26/16   Shelda Pal, DO  Oxycodone HCl 10 MG TABS Take 1 tablet (10 mg total) by mouth every 6 (six) hours as needed. For severe pain 10/02/16   Barton Dubois, MD  polyethylene glycol [facility] / GLYCOLAX) packet Take 17 g by mouth daily. 10/03/16   Barton Dubois, MD    Family History Family History  Problem Relation Age of Onset  . Hypertension Mother   . Diabetes Mother   . Hyperlipidemia Mother   . Kidney failure Mother   . Emphysema Father     Social History Social History  Substance Use Topics  . Smoking status: Former Smoker    Packs/day: 0.50    Years: 30.00    Quit date: 07/10/2016  . Smokeless tobacco: Never Used  . Alcohol use No     Allergies   Hydrocodone and Tramadol   Review of Systems Review of Systems  Constitutional:       Per HPI, otherwise negative  HENT:       Per HPI, otherwise negative  Respiratory:       Per HPI, otherwise negative  Cardiovascular:       Per HPI, otherwise negative  Gastrointestinal: Negative for vomiting.  Endocrine:       Negative aside from HPI  Genitourinary:       Neg aside from HPI   Musculoskeletal:       Per HPI, otherwise negative  Skin: Negative for color change.  Allergic/Immunologic:       Gout  Neurological: Negative for syncope and weakness.     Physical  Exam Updated Vital Signs BP (!) 191/102 (BP Location: Right Arm)   Pulse 108   Temp 98.2 F (36.8 C) (Oral)   Resp 18   Ht 5\' 10"  (1.778 m)   Wt 191 lb (86.6 kg)   SpO2 97%   BMI 27.41 kg/m   Physical Exam  Constitutional: He is oriented to person, place, and time. He appears well-developed. No distress.  HENT:  Head: Normocephalic and atraumatic.  Eyes: Conjunctivae and EOM are normal.  Cardiovascular: Normal rate and regular rhythm.   Pulmonary/Chest: Effort normal. No stridor. No respiratory distress.  Abdominal: He exhibits no distension.  Musculoskeletal: He exhibits no edema.  Arms: Neurological: He is alert and oriented to person, place, and time.  Skin: Skin is warm and dry.  Psychiatric: He has a normal mood and affect.  Nursing note and vitals reviewed.    ED Treatments / Results  Labs (all labs ordered are listed, but only abnormal results are displayed) Labs Reviewed  BASIC METABOLIC PANEL - Abnormal; Notable for the following:       Result Value   Potassium 3.4 (*)    Glucose, Bld 168 (*)    BUN 22 (*)    Creatinine, Ser 1.34 (*)    GFR calc non Af Amer 56 (*)    All other components within normal limits      Procedures Procedures (including critical care time)  Medications Ordered in ED Medications  dexamethasone (DECADRON) injection 10 mg (not administered)     Initial Impression / Assessment and Plan / ED Course  I have reviewed the triage vital signs and the nursing notes.  Pertinent labs & imaging results that were available during my care of the patient were reviewed by me and considered in my medical decision making (see chart for details).  Chart review notable for 9 ED visits within the past 6 months, one hospitalization 2 months ago following an episode of SIRS, admission ruled out sepsis, and inflammatory state was attributed to gout flare.  Echo in 10/16 showed EF 30-35%   Clinical Course     4:32 AM Patient awake and  alert in no distress. We discussed all findings. Patient will follow-up with primary care later today, but will receive refill of his colchicine prescription. Chemstrip panel consistent with chronic kidney disease, no notable progression.  With the patient's well documented history of gout, no fever, no erythema, no warmth about the dorsum of the right hand, there is low suspicion for septic arthritis. No evidence for acute kidney injury. Patient discharged with close outpatient follow-up.    Final Clinical Impressions(s) / ED Diagnoses  Hand pain, likely gout flare   Carmin Muskrat, MD 12/08/16 [phone]

## 2016-12-08 NOTE — ED Notes (Addendum)
Swelling and pain to right hand, pain to right shoulder, pt states his pain is worse when it gets cold out.  Pt was supposed to see his PCP last week on Wednesday in order to get his gout medications refilled and pt did not show for his appointment.

## 2016-12-08 NOTE — ED Triage Notes (Signed)
Pt c/o gout pain in his right shoulder and right hand.  He states that he has taken all of his allopurinol and colchicine and it has not helped.  He was supposed to see his PCP last week on 12/6, but pt states he did not go bc he "was hurting too much."

## 2016-12-08 NOTE — Discharge Instructions (Signed)
As discussed, your evaluation today has been largely reassuring.  But, it is important that you monitor your condition carefully, and do not hesitate to return to the ED if you develop new, or concerning changes in your condition. ? ?Otherwise, please follow-up with your physician for appropriate ongoing care. ? ?

## 2016-12-09 ENCOUNTER — Telehealth: Payer: Self-pay | Admitting: Family Medicine

## 2016-12-09 NOTE — Telephone Encounter (Signed)
Schedule patient for ER f/u and to discuss proper ER usage. Let me know what he decides. TY.

## 2016-12-10 NOTE — Telephone Encounter (Signed)
Please call and schedule patient for an ER follow up.

## 2016-12-10 NOTE — Telephone Encounter (Signed)
Patient could only come in on 12/16/16 to follow up. ED follow up scheduled for 12/16/16 at 9:00am, 30 min appointment.

## 2016-12-16 ENCOUNTER — Encounter: Payer: Self-pay | Admitting: Family Medicine

## 2016-12-16 ENCOUNTER — Ambulatory Visit (INDEPENDENT_AMBULATORY_CARE_PROVIDER_SITE_OTHER): Payer: Medicare Other | Admitting: Family Medicine

## 2016-12-16 VITALS — BP 164/91 | HR 89 | Temp 98.1°F | Ht 70.0 in | Wt 195.0 lb

## 2016-12-16 DIAGNOSIS — R05 Cough: Secondary | ICD-10-CM

## 2016-12-16 DIAGNOSIS — I25119 Atherosclerotic heart disease of native coronary artery with unspecified angina pectoris: Secondary | ICD-10-CM

## 2016-12-16 DIAGNOSIS — E1165 Type 2 diabetes mellitus with hyperglycemia: Secondary | ICD-10-CM

## 2016-12-16 DIAGNOSIS — N183 Chronic kidney disease, stage 3 (moderate): Secondary | ICD-10-CM

## 2016-12-16 DIAGNOSIS — R059 Cough, unspecified: Secondary | ICD-10-CM

## 2016-12-16 DIAGNOSIS — M1A0620 Idiopathic chronic gout, left knee, without tophus (tophi): Secondary | ICD-10-CM | POA: Diagnosis not present

## 2016-12-16 DIAGNOSIS — IMO0002 Reserved for concepts with insufficient information to code with codable children: Secondary | ICD-10-CM

## 2016-12-16 DIAGNOSIS — I10 Essential (primary) hypertension: Secondary | ICD-10-CM | POA: Diagnosis not present

## 2016-12-16 DIAGNOSIS — E1122 Type 2 diabetes mellitus with diabetic chronic kidney disease: Secondary | ICD-10-CM

## 2016-12-16 LAB — COMPREHENSIVE METABOLIC PANEL
ALT: 10 U/L (ref 0–53)
AST: 13 U/L (ref 0–37)
Albumin: 4 g/dL (ref 3.5–5.2)
Alkaline Phosphatase: 52 U/L (ref 39–117)
BILIRUBIN TOTAL: 0.4 mg/dL (ref 0.2–1.2)
BUN: 22 mg/dL (ref 6–23)
CALCIUM: 9.6 mg/dL (ref 8.4–10.5)
CHLORIDE: 102 meq/L (ref 96–112)
CO2: 29 meq/L (ref 19–32)
Creatinine, Ser: 1.24 mg/dL (ref 0.40–1.50)
GFR: 76.16 mL/min (ref >60.00)
Glucose, Bld: 145 mg/dL — ABNORMAL HIGH (ref 70–99)
POTASSIUM: 3.6 meq/L (ref 3.5–5.1)
Sodium: 141 mEq/L (ref 135–145)
Total Protein: 8 g/dL (ref 6.0–8.3)

## 2016-12-16 LAB — HEMOGLOBIN A1C: HEMOGLOBIN A1C: 7.6 % — AB (ref 4.6–6.5)

## 2016-12-16 LAB — URIC ACID: Uric Acid, Serum: 7.6 mg/dL (ref 4.0–7.8)

## 2016-12-16 MED ORDER — BENZONATATE 100 MG PO CAPS: 100.0000 mg | ORAL_CAPSULE | Freq: Three times a day (TID) | ORAL | 0 refills | Status: DC | PRN

## 2016-12-16 MED ORDER — NIFEDIPINE ER 60 MG PO TB24: 60.0000 mg | ORAL_TABLET | Freq: Every day | ORAL | 1 refills | Status: DC

## 2016-12-16 NOTE — Progress Notes (Signed)
Pre visit review using our clinic review tool, if applicable. No additional management support is needed unless otherwise documented below in the visit note. 

## 2016-12-16 NOTE — Progress Notes (Signed)
Chief Complaint  Patient presents with  . Follow-up    ED    Subjective: Patient is a 60 y.o. male here for ED f/u.  Was having another gout flare. Is currently on Colchicine BID and Allopurinol 300 mg daily.  Feeling OK right now. No swelling or redness.  Hypertension Patient presents for hypertension follow up. He does monitor home blood pressures. Blood pressures ranging on average from 150's/90's. He is compliant with medications. Patient has these side effects of medication: none He is not adhering to a healthy diet overall. Exercise: none  Cough 3 days of cough, secretions. Denies fevers, ST, nasal congestion, ear pain/drainage. No sick contacts. Has not tried anything at home.  ROS: Heart: Denies chest pain or palpitations Lungs: Denies SOB, +cough  Family History  Problem Relation Age of Onset  . Hypertension Mother   . Diabetes Mother   . Hyperlipidemia Mother   . Kidney failure Mother   . Emphysema Father    Past Medical History:  Diagnosis Date  . CAD (coronary artery disease) 2010   3v CABG  . COPD with chronic bronchitis ([place]) 08/26/2016  . Diabetes mellitus type 2, uncontrolled ([place]) 09/11/2016  . Gout   . Hypertension    Allergies  Allergen Reactions  . Hydrocodone Itching  . Tramadol Nausea Only    Current Outpatient Prescriptions:  .  albuterol (PROVENTIL) (2.5 MG/3ML) 0.083% nebulizer solution, Take 3 mLs (2.5 mg total) by nebulization every 6 (six) hours as needed for wheezing or shortness of breath., Disp: 75 mL, Rfl: 3 .  allopurinol (ZYLOPRIM) 300 MG tablet, Take 1 tablet (300 mg total) by mouth daily., Disp: 30 tablet, Rfl: 1 .  atorvastatin (LIPITOR) 80 MG tablet, Take 1 tablet (80 mg total) by mouth daily. (Patient taking differently: Take 80 mg by mouth every morning. ), Disp: 30 tablet, Rfl: 11 .  carvedilol (COREG) 6.25 MG tablet, Take 6.25 mg by mouth 2 (two) times daily., Disp: , Rfl:  .  colchicine 0.6 MG tablet, Take 1 tablet  (0.6 mg total) by mouth 2 (two) times daily., Disp: 60 tablet, Rfl: 0 .  docusate sodium (COLACE) 100 MG capsule, Take 1 capsule (100 mg total) by mouth 2 (two) times daily., Disp: 60 capsule, Rfl: 0 .  famotidine (PEPCID) 20 MG tablet, Take 1 tablet (20 mg total) by mouth 2 (two) times daily. (Patient taking differently: Take 20 mg by mouth 2 (two) times daily as needed for heartburn or indigestion. ), Disp: 30 tablet, Rfl: 0 .  furosemide (LASIX) 20 MG tablet, Take 60 mg by mouth every morning., Disp: , Rfl:  .  glucose blood test strip, Use as instructed, Disp: 100 each, Rfl: 3 .  Lancets (FREESTYLE) lancets, Use to check sugars daily, Disp: 100 each, Rfl: 3 .  lisinopril (PRINIVIL,ZESTRIL) 40 MG tablet, Take 40 mg by mouth every morning. , Disp: , Rfl: 11 .  metFORMIN (GLUCOPHAGE) 1000 MG tablet, Take 1,000 mg by mouth 2 (two) times daily., Disp: , Rfl:  .  methocarbamol (ROBAXIN) 500 MG tablet, Take 2 tablets (1,000 mg total) by mouth every 8 (eight) hours as needed for muscle spasms., Disp: 30 tablet, Rfl: 0 .  NIFEdipine (PROCARDIA-XL/ADALAT CC) 60 MG 24 hr tablet, Take 1 tablet (60 mg total) by mouth daily., Disp: 30 tablet, Rfl: 1 .  polyethylene glycol (MIRALAX / GLYCOLAX) packet, Take 17 g by mouth daily., Disp: 28 each, Rfl: 0 .  benzonatate (TESSALON) 100 MG capsule, Take 1 capsule (  100 mg total) by mouth 3 (three) times daily as needed., Disp: 20 capsule, Rfl: 0  Objective: BP (!) 164/91 (BP Location: Left Arm, Patient Position: Sitting, Cuff Size: Small)   Pulse 89   Temp 98.1 F (36.7 C) (Oral)   Ht 5\' 10"  (1.778 m)   Wt 195 lb (88.5 kg)   SpO2 98%   BMI 27.98 kg/m  General: Awake, appears stated age HEENT: MMM, EOMi Heart: RRR, no murmurs Lungs: CTAB, no rales, wheezes or rhonchi. No accessory muscle use MSK: Knees are without erythema or warmth. Psych: Age appropriate judgment and insight, normal affect and mood  Assessment and Plan: Idiopathic chronic gout of left  knee without tophus - Plan: Uric acid  Uncontrolled type 2 diabetes mellitus with stage 3 chronic kidney disease, without long-term current use of insulin (HCC) - Plan: Comprehensive metabolic panel, Hemoglobin A1c  Essential hypertension - Plan: Comprehensive metabolic panel, NIFEdipine (PROCARDIA-XL/ADALAT CC) 60 MG 24 hr tablet  Orders as above. May need to add Uloric given his renal function and reservation about increasing dose of other 2 meds he is on. Recheck in 4 weeks. Exforge for BP med as thiazide diuretics may not be best option for him.  The patient voiced understanding and agreement to the plan.  [place], DO 12/16/16  9:20 AM

## 2016-12-20 ENCOUNTER — Emergency Department (HOSPITAL_BASED_OUTPATIENT_CLINIC_OR_DEPARTMENT_OTHER)
Admission: EM | Admit: 2016-12-20 | Discharge: 2016-12-20 | Disposition: A | Discharge status: Home / Self Care | Payer: Medicare Other | Attending: Emergency Medicine | Admitting: Emergency Medicine

## 2016-12-20 ENCOUNTER — Encounter (HOSPITAL_BASED_OUTPATIENT_CLINIC_OR_DEPARTMENT_OTHER): Payer: Self-pay | Admitting: Emergency Medicine

## 2016-12-20 ENCOUNTER — Emergency Department (HOSPITAL_BASED_OUTPATIENT_CLINIC_OR_DEPARTMENT_OTHER): Payer: Medicare Other

## 2016-12-20 DIAGNOSIS — R0602 Shortness of breath: Secondary | ICD-10-CM | POA: Diagnosis present

## 2016-12-20 DIAGNOSIS — Z87891 Personal history of nicotine dependence: Secondary | ICD-10-CM | POA: Diagnosis not present

## 2016-12-20 DIAGNOSIS — I130 Hypertensive heart and chronic kidney disease with heart failure and stage 1 through stage 4 chronic kidney disease, or unspecified chronic kidney disease: Secondary | ICD-10-CM | POA: Insufficient documentation

## 2016-12-20 DIAGNOSIS — J449 Chronic obstructive pulmonary disease, unspecified: Secondary | ICD-10-CM | POA: Diagnosis not present

## 2016-12-20 DIAGNOSIS — Z7984 Long term (current) use of oral hypoglycemic drugs: Secondary | ICD-10-CM | POA: Diagnosis not present

## 2016-12-20 DIAGNOSIS — I2510 Atherosclerotic heart disease of native coronary artery without angina pectoris: Secondary | ICD-10-CM | POA: Insufficient documentation

## 2016-12-20 DIAGNOSIS — I509 Heart failure, unspecified: Secondary | ICD-10-CM | POA: Insufficient documentation

## 2016-12-20 DIAGNOSIS — I5023 Acute on chronic systolic (congestive) heart failure: Secondary | ICD-10-CM

## 2016-12-20 DIAGNOSIS — E1122 Type 2 diabetes mellitus with diabetic chronic kidney disease: Secondary | ICD-10-CM | POA: Diagnosis not present

## 2016-12-20 DIAGNOSIS — N189 Chronic kidney disease, unspecified: Secondary | ICD-10-CM | POA: Insufficient documentation

## 2016-12-20 DIAGNOSIS — Z79899 Other long term (current) drug therapy: Secondary | ICD-10-CM | POA: Insufficient documentation

## 2016-12-20 LAB — CBC WITH DIFFERENTIAL/PLATELET
BASOS ABS: 0 10*3/uL (ref 0.0–0.1)
BASOS PCT: 1 %
Eosinophils Absolute: 0.2 10*3/uL (ref 0.0–0.7)
Eosinophils Relative: 3 %
HEMATOCRIT: 35.4 % — AB (ref 39.0–52.0)
HEMOGLOBIN: 11.5 g/dL — AB (ref 13.0–17.0)
Lymphocytes Relative: 28 %
Lymphs Abs: 1.7 10*3/uL (ref 0.7–4.0)
MCH: 29.6 pg (ref 26.0–34.0)
MCHC: 32.5 g/dL (ref 30.0–36.0)
MCV: 91.2 fL (ref 78.0–100.0)
MONOS PCT: 14 %
Monocytes Absolute: 0.9 10*3/uL (ref 0.1–1.0)
NEUTROS ABS: 3.4 10*3/uL (ref 1.7–7.7)
NEUTROS PCT: 54 %
Platelets: 260 10*3/uL (ref 150–400)
RBC: 3.88 MIL/uL — AB (ref 4.22–5.81)
RDW: 15.6 % — ABNORMAL HIGH (ref 11.5–15.5)
WBC: 6.1 10*3/uL (ref 4.0–10.5)

## 2016-12-20 LAB — BRAIN NATRIURETIC PEPTIDE: B Natriuretic Peptide: 791.3 pg/mL — ABNORMAL HIGH (ref 0.0–100.0)

## 2016-12-20 LAB — TROPONIN I: TROPONIN I: 0.09 ng/mL — AB (ref <0.03)

## 2016-12-20 LAB — BASIC METABOLIC PANEL
ANION GAP: 9 (ref 5–15)
BUN: 18 mg/dL (ref 6–20)
CHLORIDE: 106 mmol/L (ref 101–111)
CO2: 24 mmol/L (ref 22–32)
Calcium: 9 mg/dL (ref 8.9–10.3)
Creatinine, Ser: 1.36 mg/dL — ABNORMAL HIGH (ref 0.61–1.24)
GFR calc non Af Amer: 55 mL/min — ABNORMAL LOW (ref >60)
Glucose, Bld: 116 mg/dL — ABNORMAL HIGH (ref 65–99)
POTASSIUM: 3.8 mmol/L (ref 3.5–5.1)
Sodium: 139 mmol/L (ref 135–145)

## 2016-12-20 MED ORDER — FUROSEMIDE 10 MG/ML IJ SOLN
60.0000 mg | Freq: Once | INTRAMUSCULAR | Status: AC
  Administered 2016-12-20: 60 mg via INTRAVENOUS
  Filled 2016-12-20: qty 6

## 2016-12-20 MED ORDER — IPRATROPIUM-ALBUTEROL 0.5-2.5 (3) MG/3ML IN SOLN
3.0000 mL | Freq: Once | RESPIRATORY_TRACT | Status: AC
  Administered 2016-12-20: 3 mL via RESPIRATORY_TRACT
  Filled 2016-12-20: qty 3

## 2016-12-20 NOTE — ED Triage Notes (Signed)
Patient reports that he has been SOB since yesterday

## 2016-12-20 NOTE — ED Provider Notes (Signed)
[facility]PT [facility] Provider Note   CSN: [id number] Arrival date & time: 12/20/16  0058     History   Chief Complaint Chief Complaint  Patient presents with  . Shortness of Breath    HPI Patrick Stewart is a 60 y.o. male.  HPI  This is a 60 year old male with a history of coronary artery disease, COPD, CHF who presents with shortness of breath. Patient reports 2-3 day history of worsening shortness of breath. The patient saw primary physician on Wednesday for regular appointment. Since that time he has had worsening shortness of breath. Reports lower extremity edema and orthopnea. Difficulty sleeping at night. Denies fever or cough.  Denies chest pain. Reports taking his daily medications as directed. No new changes in medications.  Past Medical History:  Diagnosis Date  . CAD (coronary artery disease) 2010   3v CABG  . COPD with chronic bronchitis ([place]) 08/26/2016  . Diabetes mellitus type 2, uncontrolled ([place]) 09/11/2016  . Gout   . Hypertension     Patient Active Problem List   Diagnosis Date Noted  . Diabetes mellitus type 2, uncontrolled ([place]) 09/11/2016  . COPD with chronic bronchitis ([place]) 08/26/2016  . Non-ST elevation MI (NSTEMI) ([place]) 07/08/2016  . Kidney stone on right side 10/25/2015  . CKD (chronic kidney disease) 10/13/2015  . Idiopathic gout of left knee 10/13/2015  . Abnormal EKG 10/13/2015  . CAD Status post CABG 2 SVG to OM 1, LIMA to mid LAD: 10/13/2015  . Cardiomyopathy, ischemic, chronic systolic CHF 02/07/1734  . Acute pericarditis 05/05/2015  . Essential hypertension 09/04/2014  . Benign prostatic hypertrophy with lower urinary tract symptoms (LUTS) 01/24/2013  . Peyronie's disease 12/18/2012  . Chronic joint pain 06/22/2012  . Congestive heart failure ([place]) 01/26/2012  . History of tobacco abuse 01/26/2012  . Inguinal hernia, right 01/26/2012  . Lipid disorder 01/26/2012    Past Surgical History:  Procedure Laterality Date  . CORONARY  ARTERY BYPASS GRAFT  2010  . KNEE SURGERY    . ROTATOR CUFF REPAIR    . WRIST SURGERY         Home Medications    Prior to Admission medications   Medication Sig Start Date End Date Taking? Authorizing Provider  albuterol (PROVENTIL) (2.5 MG/3ML) 0.083% nebulizer solution Take 3 mLs (2.5 mg total) by nebulization every 6 (six) hours as needed for wheezing or shortness of breath. 08/26/16   Shelda Pal, DO  allopurinol (ZYLOPRIM) 300 MG tablet Take 1 tablet (300 mg total) by mouth daily. 10/02/16   Barton Dubois, MD  atorvastatin (LIPITOR) 80 MG tablet Take 1 tablet (80 mg total) by mouth daily. Patient taking differently: Take 80 mg by mouth every morning.  08/26/16   Crosby Oyster Wendling, DO  benzonatate (TESSALON) 100 MG capsule Take 1 capsule (100 mg total) by mouth 3 (three) times daily as needed. 12/16/16   Shelda Pal, DO  carvedilol (COREG) 6.25 MG tablet Take 6.25 mg by mouth 2 (two) times daily. 10/25/15   Historical Provider, MD  colchicine 0.6 MG tablet Take 1 tablet (0.6 mg total) by mouth 2 (two) times daily. 12/08/16   Carmin Muskrat, MD  docusate sodium (COLACE) 100 MG capsule Take 1 capsule (100 mg total) by mouth 2 (two) times daily. 10/02/16   Barton Dubois, MD  famotidine (PEPCID) 20 MG tablet Take 1 tablet (20 mg total) by mouth 2 (two) times daily. Patient taking differently: Take 20 mg by mouth 2 (two) times daily as  needed for heartburn or indigestion.  04/29/16   Dorie Rank, MD  furosemide (LASIX) 20 MG tablet Take 60 mg by mouth every morning.    Historical Provider, MD  glucose blood test strip Use as instructed 08/26/16   Shelda Pal, DO  Lancets (FREESTYLE) lancets Use to check sugars daily 08/26/16   Crosby Oyster Wendling, DO  lisinopril (PRINIVIL,ZESTRIL) 40 MG tablet Take 40 mg by mouth every morning.  03/27/16   Historical Provider, MD  metFORMIN (GLUCOPHAGE) 1000 MG tablet Take 1,000 mg by mouth 2 (two) times daily. 10/25/15    Historical Provider, MD  methocarbamol (ROBAXIN) 500 MG tablet Take 2 tablets (1,000 mg total) by mouth every 8 (eight) hours as needed for muscle spasms. 04/29/16   Dorie Rank, MD  NIFEdipine (PROCARDIA-XL/ADALAT CC) 60 MG 24 hr tablet Take 1 tablet (60 mg total) by mouth daily. 12/16/16   Shelda Pal, DO  polyethylene glycol [facility] / GLYCOLAX) packet Take 17 g by mouth daily. 10/03/16   Barton Dubois, MD    Family History Family History  Problem Relation Age of Onset  . Hypertension Mother   . Diabetes Mother   . Hyperlipidemia Mother   . Kidney failure Mother   . Emphysema Father     Social History Social History  Substance Use Topics  . Smoking status: Former Smoker    Packs/day: 0.50    Years: 30.00    Quit date: 07/10/2016  . Smokeless tobacco: Never Used  . Alcohol use No     Allergies   Hydrocodone and Tramadol   Review of Systems Review of Systems  Constitutional: Negative for fever.  Respiratory: Positive for shortness of breath. Negative for cough.   Cardiovascular: Positive for leg swelling. Negative for chest pain.  Gastrointestinal: Negative for abdominal pain, nausea and vomiting.  All other systems reviewed and are negative.    Physical Exam Updated Vital Signs BP (!) 172/114   Pulse 94   Temp 98 F (36.7 C) (Oral)   Resp 25   Ht 5\' 10"  (1.778 m)   Wt 196 lb (88.9 kg)   SpO2 97%   BMI 28.12 kg/m   Physical Exam  Constitutional: He is oriented to person, place, and time. No distress.  HENT:  Head: Normocephalic and atraumatic.  Eyes: Pupils are equal, round, and reactive to light.  Cardiovascular: Normal rate, regular rhythm and normal heart sounds.   No murmur heard. Pulmonary/Chest: Effort normal and breath sounds normal. No respiratory distress. He has no wheezes.  Crackles in all lung fields, no increased work of breathing, no respiratory distress  Abdominal: Soft. Bowel sounds are normal. There is no tenderness. There is no  rebound.  Musculoskeletal: He exhibits edema.  1+ bilateral lower extremity edema  Neurological: He is alert and oriented to person, place, and time.  Skin: Skin is warm and dry.  Psychiatric: He has a normal mood and affect.  Nursing note and vitals reviewed.    ED Treatments / Results  Labs (all labs ordered are listed, but only abnormal results are displayed) Labs Reviewed  CBC WITH DIFFERENTIAL/PLATELET - Abnormal; Notable for the following:       Result Value   RBC 3.88 (*)    Hemoglobin 11.5 (*)    HCT 35.4 (*)    RDW 15.6 (*)    All other components within normal limits  BASIC METABOLIC PANEL - Abnormal; Notable for the following:    Glucose, Bld 116 (*)  Creatinine, Ser 1.36 (*)    GFR calc non Af Amer 55 (*)    All other components within normal limits  BRAIN NATRIURETIC PEPTIDE - Abnormal; Notable for the following:    B Natriuretic Peptide 791.3 (*)    All other components within normal limits  TROPONIN I - Abnormal; Notable for the following:    Troponin I 0.09 (*)    All other components within normal limits    EKG  EKG Interpretation  Date/Time:  Sunday December 20 2016 01:12:53 EST Ventricular Rate:  100 PR Interval:    QRS Duration: 178 QT Interval:  414 QTC Calculation: 534 R Axis:   -30 Text Interpretation:  Sinus tachycardia Probable left atrial enlargement Left bundle branch block No significant change since last tracing Confirmed by Goldy Calandra  MD, Loma Sousa ([id number]) on 12/20/2016 1:17:55 AM       Radiology Dg Chest 2 View  Result Date: 12/20/2016 CLINICAL DATA:  60 year old male with acute onset shortness of breath. Chest radiograph dated 09/29/2016 EXAM: CHEST  2 VIEW COMPARISON:  None. FINDINGS: There is moderate cardiomegaly with mild vascular congestion. No focal consolidation, pleural effusion, or pneumothorax. Median sternotomy wires and CABG vascular clips noted. No acute osseous pathology. IMPRESSION: Cardiomegaly with mild congestive  changes. No focal consolidation or pneumothorax. Electronically Signed   By: Anner Crete M.D.   On: 12/20/2016 02:15    Procedures Procedures (including critical care time)  Medications Ordered in ED Medications  ipratropium-albuterol (DUONEB) 0.5-2.5 (3) MG/3ML nebulizer solution 3 mL (3 mLs Nebulization Given 12/20/16 0133)  furosemide (LASIX) injection 60 mg (60 mg Intravenous Given 12/20/16 0224)     Initial Impression / Assessment and Plan / ED Course  I have reviewed the triage vital signs and the nursing notes.  Pertinent labs & imaging results that were available during my care of the patient were reviewed by me and considered in my medical decision making (see chart for details).  Clinical Course    Patient presents with shortness of breath. Vital signs notable for blood pressure of 186/105. History of hypertension. History and physical exam is suggestive of pulmonary edema. He is not requiring any supplemental oxygen. He does have peripheral edema. Recent lab work obtained. BNP elevated. Troponin at baseline at 0.09. EKG is unchanged and patient is not having any chest pain. Chest x-ray suggestive of pulmonary edema. Patient given 60 mg of IV Lasix. Given that he is in no respiratory distress does not require any supplemental oxygen, he was ambulated with pulse ox and able to keep his oxygen saturations at 100%. Feel he is likely candidate for discharge home. Continue home daily dose of Lasix. Follow-up with primary physician in 2 days for recheck. For any new or worsening symptoms she needs to be reevaluated immediately.   After history, exam, and medical workup I feel the patient has been appropriately medically screened and is safe for discharge home. Pertinent diagnoses were discussed with the patient. Patient was given return precautions.  Final Clinical Impressions(s) / ED Diagnoses   Final diagnoses:  Acute on chronic systolic heart failure (HCC)  SOB (shortness of  breath)    New Prescriptions New Prescriptions   No medications on file     Merryl Hacker, MD 12/20/16 [phone]

## 2016-12-20 NOTE — ED Notes (Signed)
Ambulating oxygen saturation checked. Pt maintained saturation of 100% while completing a lap around the unit. EDP notified.

## 2016-12-20 NOTE — Discharge Instructions (Signed)
You were seen today for shortness of breath. You likely have some fluid under the lungs because of your heart failure. You were given a dose of IV furosemide. Continue your daily dose as prescribed. Follow-up with your primary doctor on Tuesday for recheck. If you have worsening symptoms you need to be reevaluated immediately.

## 2016-12-24 DIAGNOSIS — Z23 Encounter for immunization: Secondary | ICD-10-CM | POA: Diagnosis not present

## 2017-01-07 ENCOUNTER — Encounter: Payer: Self-pay | Admitting: Family Medicine

## 2017-01-07 ENCOUNTER — Ambulatory Visit (INDEPENDENT_AMBULATORY_CARE_PROVIDER_SITE_OTHER): Payer: Medicare Other | Admitting: Family Medicine

## 2017-01-07 VITALS — BP 110/60 | HR 83 | Temp 97.6°F | Ht 70.0 in | Wt 194.2 lb

## 2017-01-07 DIAGNOSIS — I10 Essential (primary) hypertension: Secondary | ICD-10-CM

## 2017-01-07 DIAGNOSIS — M109 Gout, unspecified: Secondary | ICD-10-CM | POA: Diagnosis not present

## 2017-01-07 DIAGNOSIS — Z09 Encounter for follow-up examination after completed treatment for conditions other than malignant neoplasm: Secondary | ICD-10-CM | POA: Diagnosis not present

## 2017-01-07 DIAGNOSIS — N183 Chronic kidney disease, stage 3 (moderate): Secondary | ICD-10-CM

## 2017-01-07 DIAGNOSIS — IMO0002 Reserved for concepts with insufficient information to code with codable children: Secondary | ICD-10-CM

## 2017-01-07 DIAGNOSIS — E1122 Type 2 diabetes mellitus with diabetic chronic kidney disease: Secondary | ICD-10-CM | POA: Diagnosis not present

## 2017-01-07 DIAGNOSIS — E1165 Type 2 diabetes mellitus with hyperglycemia: Secondary | ICD-10-CM

## 2017-01-07 MED ORDER — COLCHICINE 0.6 MG PO TABS: 0.6000 mg | ORAL_TABLET | Freq: Two times a day (BID) | ORAL | 1 refills | Status: DC

## 2017-01-07 MED ORDER — LISINOPRIL 40 MG PO TABS: 40.0000 mg | ORAL_TABLET | ORAL | 1 refills | Status: DC

## 2017-01-07 MED ORDER — ALLOPURINOL 300 MG PO TABS: 300.0000 mg | ORAL_TABLET | Freq: Two times a day (BID) | ORAL | 1 refills | Status: DC

## 2017-01-07 MED ORDER — METFORMIN HCL 500 MG PO TABS: ORAL_TABLET | ORAL | 1 refills | Status: DC

## 2017-01-07 MED ORDER — NIFEDIPINE ER 60 MG PO TB24: 60.0000 mg | ORAL_TABLET | Freq: Every day | ORAL | 1 refills | Status: DC

## 2017-01-07 NOTE — Patient Instructions (Addendum)
Keep exercising. Keep your diet clean!  I want to see you at the end of March.

## 2017-01-07 NOTE — Progress Notes (Signed)
Chief Complaint  Patient presents with  . Hospitalization Follow-up    pt was seen at the ER for CHF    Subjective: Patient is a 61 y.o. male here for ED f/u.  Tx'd for CHF exacerbation, placed on Lasix. Feels improved. Was taking Lasix that helped. No side effects with medication.  Gout- last uric acid was 7.6, improving. Renal function has remained stable. No recent attacks since starting on colchicine. Reports compliance, no side effects.  DM- A1c was improved also. He was not taking Metformin, would like to go back on it. He prefers smaller tabs if possible. Diet is OK, could be better.  ROS: Heart: Denies chest pain or palpitations Lungs: Denies SOB or cough  Family History  Problem Relation Age of Onset  . Hypertension Mother   . Diabetes Mother   . Hyperlipidemia Mother   . Kidney failure Mother   . Emphysema Father    Past Medical History:  Diagnosis Date  . CAD (coronary artery disease) 2010   3v CABG  . COPD with chronic bronchitis ([place]) 08/26/2016  . Diabetes mellitus type 2, uncontrolled ([place]) 09/11/2016  . Gout   . Hypertension    Allergies  Allergen Reactions  . Hydrocodone Itching  . Tramadol Nausea Only    Current Outpatient Prescriptions:  .  albuterol (PROVENTIL) (2.5 MG/3ML) 0.083% nebulizer solution, Take 3 mLs (2.5 mg total) by nebulization every 6 (six) hours as needed for wheezing or shortness of breath., Disp: 75 mL, Rfl: 3 .  allopurinol (ZYLOPRIM) 300 MG tablet, Take 1 tablet (300 mg total) by mouth 2 (two) times daily., Disp: 180 tablet, Rfl: 1 .  atorvastatin (LIPITOR) 80 MG tablet, Take 1 tablet (80 mg total) by mouth daily. (Patient taking differently: Take 80 mg by mouth every morning. ), Disp: 30 tablet, Rfl: 11 .  carvedilol (COREG) 6.25 MG tablet, Take 6.25 mg by mouth 2 (two) times daily., Disp: , Rfl:  .  colchicine 0.6 MG tablet, Take 1 tablet (0.6 mg total) by mouth 2 (two) times daily., Disp: 180 tablet, Rfl: 1 .  docusate sodium  (COLACE) 100 MG capsule, Take 1 capsule (100 mg total) by mouth 2 (two) times daily., Disp: 60 capsule, Rfl: 0 .  famotidine (PEPCID) 20 MG tablet, Take 1 tablet (20 mg total) by mouth 2 (two) times daily. (Patient taking differently: Take 20 mg by mouth 2 (two) times daily as needed for heartburn or indigestion. ), Disp: 30 tablet, Rfl: 0 .  furosemide (LASIX) 20 MG tablet, Take 60 mg by mouth every morning., Disp: , Rfl:  .  glucose blood test strip, Use as instructed, Disp: 100 each, Rfl: 3 .  Lancets (FREESTYLE) lancets, Use to check sugars daily, Disp: 100 each, Rfl: 3 .  lisinopril (PRINIVIL,ZESTRIL) 40 MG tablet, Take 1 tablet (40 mg total) by mouth every morning., Disp: 90 tablet, Rfl: 1 .  NIFEdipine (PROCARDIA-XL/ADALAT CC) 60 MG 24 hr tablet, Take 1 tablet (60 mg total) by mouth daily., Disp: 30 tablet, Rfl: 1 .  polyethylene glycol (MIRALAX / GLYCOLAX) packet, Take 17 g by mouth daily., Disp: 28 each, Rfl: 0 .  metFORMIN (GLUCOPHAGE) 500 MG tablet, Week 1: 1 tab daily Week 2: 1 tab twice daily Week 3: 2 tabs in AM, 1 in evening Week 4: 2 tabs twice daily, Disp: 120 tablet, Rfl: 1  Objective: BP 110/60 (BP Location: Left Arm, Patient Position: Sitting, Cuff Size: Large)   Pulse 83   Temp 97.6 F (36.4  C) (Oral)   Ht 5\' 10"  (1.778 m)   Wt 194 lb 3.2 oz (88.1 kg)   SpO2 97%   BMI 27.86 kg/m  General: Awake, appears stated age HEENT: MMM, EOMi Heart: RRR, no murmurs Lungs: CTAB, no rales, wheezes or rhonchi. No accessory muscle use Abd: BS+, soft, NT, ND, no masses or organomegaly Psych: Age appropriate judgment and insight, normal affect and mood  Assessment and Plan: Uncontrolled type 2 diabetes mellitus with stage 3 chronic kidney disease, without long-term current use of insulin ([place]) - Plan: metFORMIN (GLUCOPHAGE) 500 MG tablet  Essential hypertension - Plan: NIFEdipine (PROCARDIA-XL/ADALAT CC) 60 MG 24 hr tablet, lisinopril (PRINIVIL,ZESTRIL) 40 MG tablet  Gout of right  knee, unspecified cause, unspecified chronicity - Plan: allopurinol (ZYLOPRIM) 300 MG tablet, colchicine 0.6 MG tablet  Follow-up for resolved condition  Orders as above. CHF exacerbation resolved. OK to stop Lasix.  Will increase allopurinol and continue colchicine for 6 mo after making final change. Should restart Metformin. F/u in 2 mo to recheck A1c, renal function and uric acid.  The patient voiced understanding and agreement to the plan.  [place], DO 01/07/17  11:47 AM

## 2017-02-08 ENCOUNTER — Telehealth: Payer: Self-pay | Admitting: Family Medicine

## 2017-02-08 MED ORDER — FUROSEMIDE 20 MG PO TABS
60.0000 mg | ORAL_TABLET | ORAL | 1 refills | Status: DC
Start: 2017-02-08 — End: 2017-04-06

## 2017-02-08 NOTE — Telephone Encounter (Signed)
RX sent to the pharmacy by e-script.//AB/CMA

## 2017-02-08 NOTE — Telephone Encounter (Signed)
Caller name: Relationship to patient: Self Can be reached: [phone]  Pharmacy:  [id number] - [place], [place] [phone] (Phone) [phone] (Fax)     Reason for call: Patient request refill furosemide (LASIX) 20 MG tablet [[id number]

## 2017-04-06 ENCOUNTER — Other Ambulatory Visit: Payer: Self-pay | Admitting: Family Medicine

## 2017-04-07 NOTE — Telephone Encounter (Signed)
Rx sent to the pharmacy by e-script.//AB/CMA 

## 2017-04-11 ENCOUNTER — Inpatient Hospital Stay (HOSPITAL_COMMUNITY)
Admission: EM | Admit: 2017-04-11 | Discharge: 2017-04-15 | DRG: 291 | Disposition: A | Discharge status: Home / Self Care | Payer: Medicare Other | Attending: Cardiovascular Disease | Admitting: Cardiovascular Disease

## 2017-04-11 ENCOUNTER — Emergency Department (HOSPITAL_COMMUNITY): Payer: Medicare Other

## 2017-04-11 ENCOUNTER — Encounter (HOSPITAL_COMMUNITY): Payer: Self-pay | Admitting: Emergency Medicine

## 2017-04-11 DIAGNOSIS — Z9889 Other specified postprocedural states: Secondary | ICD-10-CM

## 2017-04-11 DIAGNOSIS — I5022 Chronic systolic (congestive) heart failure: Secondary | ICD-10-CM | POA: Diagnosis not present

## 2017-04-11 DIAGNOSIS — Z7984 Long term (current) use of oral hypoglycemic drugs: Secondary | ICD-10-CM | POA: Diagnosis not present

## 2017-04-11 DIAGNOSIS — I5043 Acute on chronic combined systolic (congestive) and diastolic (congestive) heart failure: Secondary | ICD-10-CM

## 2017-04-11 DIAGNOSIS — Z87891 Personal history of nicotine dependence: Secondary | ICD-10-CM

## 2017-04-11 DIAGNOSIS — I447 Left bundle-branch block, unspecified: Secondary | ICD-10-CM | POA: Diagnosis present

## 2017-04-11 DIAGNOSIS — Z79899 Other long term (current) drug therapy: Secondary | ICD-10-CM

## 2017-04-11 DIAGNOSIS — I351 Nonrheumatic aortic (valve) insufficiency: Secondary | ICD-10-CM | POA: Diagnosis present

## 2017-04-11 DIAGNOSIS — Z833 Family history of diabetes mellitus: Secondary | ICD-10-CM

## 2017-04-11 DIAGNOSIS — I429 Cardiomyopathy, unspecified: Secondary | ICD-10-CM | POA: Diagnosis not present

## 2017-04-11 DIAGNOSIS — I130 Hypertensive heart and chronic kidney disease with heart failure and stage 1 through stage 4 chronic kidney disease, or unspecified chronic kidney disease: Principal | ICD-10-CM | POA: Diagnosis present

## 2017-04-11 DIAGNOSIS — E1122 Type 2 diabetes mellitus with diabetic chronic kidney disease: Secondary | ICD-10-CM | POA: Diagnosis present

## 2017-04-11 DIAGNOSIS — I5021 Acute systolic (congestive) heart failure: Secondary | ICD-10-CM | POA: Diagnosis not present

## 2017-04-11 DIAGNOSIS — Z825 Family history of asthma and other chronic lower respiratory diseases: Secondary | ICD-10-CM | POA: Diagnosis not present

## 2017-04-11 DIAGNOSIS — N183 Chronic kidney disease, stage 3 (moderate): Secondary | ICD-10-CM | POA: Diagnosis present

## 2017-04-11 DIAGNOSIS — Z951 Presence of aortocoronary bypass graft: Secondary | ICD-10-CM

## 2017-04-11 DIAGNOSIS — E119 Type 2 diabetes mellitus without complications: Secondary | ICD-10-CM | POA: Diagnosis not present

## 2017-04-11 DIAGNOSIS — I252 Old myocardial infarction: Secondary | ICD-10-CM | POA: Diagnosis not present

## 2017-04-11 DIAGNOSIS — Z8249 Family history of ischemic heart disease and other diseases of the circulatory system: Secondary | ICD-10-CM | POA: Diagnosis not present

## 2017-04-11 DIAGNOSIS — N182 Chronic kidney disease, stage 2 (mild): Secondary | ICD-10-CM | POA: Diagnosis not present

## 2017-04-11 DIAGNOSIS — Z841 Family history of disorders of kidney and ureter: Secondary | ICD-10-CM

## 2017-04-11 DIAGNOSIS — M109 Gout, unspecified: Secondary | ICD-10-CM | POA: Diagnosis present

## 2017-04-11 DIAGNOSIS — I255 Ischemic cardiomyopathy: Secondary | ICD-10-CM | POA: Diagnosis present

## 2017-04-11 DIAGNOSIS — I10 Essential (primary) hypertension: Secondary | ICD-10-CM

## 2017-04-11 DIAGNOSIS — Z8739 Personal history of other diseases of the musculoskeletal system and connective tissue: Secondary | ICD-10-CM | POA: Diagnosis not present

## 2017-04-11 DIAGNOSIS — Z888 Allergy status to other drugs, medicaments and biological substances status: Secondary | ICD-10-CM

## 2017-04-11 DIAGNOSIS — R0602 Shortness of breath: Secondary | ICD-10-CM | POA: Diagnosis not present

## 2017-04-11 DIAGNOSIS — I110 Hypertensive heart disease with heart failure: Secondary | ICD-10-CM | POA: Diagnosis not present

## 2017-04-11 DIAGNOSIS — I2510 Atherosclerotic heart disease of native coronary artery without angina pectoris: Secondary | ICD-10-CM | POA: Diagnosis not present

## 2017-04-11 DIAGNOSIS — I081 Rheumatic disorders of both mitral and tricuspid valves: Secondary | ICD-10-CM | POA: Diagnosis present

## 2017-04-11 DIAGNOSIS — J449 Chronic obstructive pulmonary disease, unspecified: Secondary | ICD-10-CM | POA: Diagnosis present

## 2017-04-11 DIAGNOSIS — N179 Acute kidney failure, unspecified: Secondary | ICD-10-CM | POA: Diagnosis not present

## 2017-04-11 DIAGNOSIS — I248 Other forms of acute ischemic heart disease: Secondary | ICD-10-CM | POA: Diagnosis not present

## 2017-04-11 HISTORY — DX: Presence of aortocoronary bypass graft: Z95.1

## 2017-04-11 HISTORY — DX: Abnormal results of liver function studies: R94.5

## 2017-04-11 HISTORY — DX: Chronic kidney disease, unspecified: N18.9

## 2017-04-11 HISTORY — DX: Unstable angina: I20.0

## 2017-04-11 HISTORY — DX: Disorder of lipoprotein metabolism, unspecified: E78.9

## 2017-04-11 HISTORY — DX: Heart failure, unspecified: I50.9

## 2017-04-11 HISTORY — DX: Ischemic cardiomyopathy: I25.5

## 2017-04-11 HISTORY — DX: Calculus of kidney: N20.0

## 2017-04-11 HISTORY — DX: Non-ST elevation (NSTEMI) myocardial infarction: I21.4

## 2017-04-11 LAB — BRAIN NATRIURETIC PEPTIDE: B Natriuretic Peptide: 548.9 pg/mL — ABNORMAL HIGH (ref 0.0–100.0)

## 2017-04-11 LAB — COMPREHENSIVE METABOLIC PANEL
ALT: 19 U/L (ref 17–63)
ANION GAP: 10 (ref 5–15)
AST: 28 U/L (ref 15–41)
Albumin: 4 g/dL (ref 3.5–5.0)
Alkaline Phosphatase: 51 U/L (ref 38–126)
BILIRUBIN TOTAL: 1.2 mg/dL (ref 0.3–1.2)
BUN: 22 mg/dL — ABNORMAL HIGH (ref 6–20)
CALCIUM: 9 mg/dL (ref 8.9–10.3)
CO2: 24 mmol/L (ref 22–32)
CREATININE: 1.53 mg/dL — AB (ref 0.61–1.24)
Chloride: 105 mmol/L (ref 101–111)
GFR, EST AFRICAN AMERICAN: 55 mL/min — AB (ref >60)
GFR, EST NON AFRICAN AMERICAN: 47 mL/min — AB (ref >60)
Glucose, Bld: 164 mg/dL — ABNORMAL HIGH (ref 65–99)
Potassium: 4.1 mmol/L (ref 3.5–5.1)
SODIUM: 139 mmol/L (ref 135–145)
TOTAL PROTEIN: 8 g/dL (ref 6.5–8.1)

## 2017-04-11 LAB — CBC WITH DIFFERENTIAL/PLATELET
BASOS ABS: 0 10*3/uL (ref 0.0–0.1)
Basophils Relative: 0 %
EOS ABS: 0.2 10*3/uL (ref 0.0–0.7)
EOS PCT: 3 %
HEMATOCRIT: 36.7 % — AB (ref 39.0–52.0)
Hemoglobin: 12 g/dL — ABNORMAL LOW (ref 13.0–17.0)
LYMPHS PCT: 27 %
Lymphs Abs: 1.7 10*3/uL (ref 0.7–4.0)
MCH: 30.3 pg (ref 26.0–34.0)
MCHC: 32.7 g/dL (ref 30.0–36.0)
MCV: 92.7 fL (ref 78.0–100.0)
MONO ABS: 0.8 10*3/uL (ref 0.1–1.0)
Monocytes Relative: 13 %
Neutro Abs: 3.6 10*3/uL (ref 1.7–7.7)
Neutrophils Relative %: 57 %
PLATELETS: 211 10*3/uL (ref 150–400)
RBC: 3.96 MIL/uL — ABNORMAL LOW (ref 4.22–5.81)
RDW: 14.8 % (ref 11.5–15.5)
WBC: 6.4 10*3/uL (ref 4.0–10.5)

## 2017-04-11 LAB — CREATININE, SERUM
Creatinine, Ser: 1.6 mg/dL — ABNORMAL HIGH (ref 0.61–1.24)
GFR, EST AFRICAN AMERICAN: 52 mL/min — AB (ref >60)
GFR, EST NON AFRICAN AMERICAN: 45 mL/min — AB (ref >60)

## 2017-04-11 LAB — TROPONIN I: TROPONIN I: 0.14 ng/mL — AB (ref <0.03)

## 2017-04-11 LAB — URIC ACID: Uric Acid, Serum: 10.5 mg/dL — ABNORMAL HIGH (ref 4.4–7.6)

## 2017-04-11 LAB — MRSA PCR SCREENING: MRSA by PCR: POSITIVE — AB

## 2017-04-11 MED ORDER — ALBUTEROL SULFATE (2.5 MG/3ML) 0.083% IN NEBU
5.0000 mg | INHALATION_SOLUTION | Freq: Once | RESPIRATORY_TRACT | Status: AC
  Administered 2017-04-11: 5 mg via RESPIRATORY_TRACT
  Filled 2017-04-11: qty 6

## 2017-04-11 MED ORDER — ALBUTEROL SULFATE (2.5 MG/3ML) 0.083% IN NEBU
2.5000 mg | INHALATION_SOLUTION | Freq: Four times a day (QID) | RESPIRATORY_TRACT | Status: DC | PRN
  Administered 2017-04-11: 2.5 mg via RESPIRATORY_TRACT
  Filled 2017-04-11: qty 3

## 2017-04-11 MED ORDER — SODIUM CHLORIDE 0.9% FLUSH
3.0000 mL | INTRAVENOUS | Status: DC | PRN
Start: 2017-04-11 — End: 2017-04-14

## 2017-04-11 MED ORDER — METFORMIN HCL 500 MG PO TABS
500.0000 mg | ORAL_TABLET | Freq: Two times a day (BID) | ORAL | Status: DC
  Administered 2017-04-11 – 2017-04-14 (×7): 500 mg via ORAL
  Filled 2017-04-11 (×7): qty 1

## 2017-04-11 MED ORDER — FUROSEMIDE 10 MG/ML IJ SOLN
80.0000 mg | Freq: Two times a day (BID) | INTRAMUSCULAR | Status: DC
  Administered 2017-04-11 – 2017-04-12 (×2): 80 mg via INTRAVENOUS
  Filled 2017-04-11 (×2): qty 8

## 2017-04-11 MED ORDER — SODIUM CHLORIDE 0.9% FLUSH
3.0000 mL | Freq: Two times a day (BID) | INTRAVENOUS | Status: DC
  Administered 2017-04-11 – 2017-04-14 (×7): 3 mL via INTRAVENOUS

## 2017-04-11 MED ORDER — COLCHICINE 0.6 MG PO TABS
0.6000 mg | ORAL_TABLET | Freq: Two times a day (BID) | ORAL | Status: DC
  Administered 2017-04-11 – 2017-04-15 (×8): 0.6 mg via ORAL
  Filled 2017-04-11 (×9): qty 1

## 2017-04-11 MED ORDER — FUROSEMIDE 10 MG/ML IJ SOLN
60.0000 mg | Freq: Once | INTRAMUSCULAR | Status: AC
  Administered 2017-04-11: 60 mg via INTRAVENOUS
  Filled 2017-04-11: qty 8

## 2017-04-11 MED ORDER — MUPIROCIN 2 % EX OINT
1.0000 "application " | TOPICAL_OINTMENT | Freq: Two times a day (BID) | CUTANEOUS | Status: DC
  Administered 2017-04-11 – 2017-04-15 (×8): 1 via NASAL
  Filled 2017-04-11: qty 22

## 2017-04-11 MED ORDER — LISINOPRIL 40 MG PO TABS
40.0000 mg | ORAL_TABLET | ORAL | Status: DC
  Administered 2017-04-11 – 2017-04-12 (×2): 40 mg via ORAL
  Filled 2017-04-11 (×2): qty 1

## 2017-04-11 MED ORDER — CARVEDILOL 6.25 MG PO TABS
6.2500 mg | ORAL_TABLET | Freq: Two times a day (BID) | ORAL | Status: DC
  Administered 2017-04-11 – 2017-04-15 (×8): 6.25 mg via ORAL
  Filled 2017-04-11 (×9): qty 1

## 2017-04-11 MED ORDER — FAMOTIDINE 20 MG PO TABS: 20.0000 mg | ORAL_TABLET | Freq: Two times a day (BID) | ORAL | Status: DC | PRN

## 2017-04-11 MED ORDER — ISOSORB DINITRATE-HYDRALAZINE 20-37.5 MG PO TABS
1.0000 | ORAL_TABLET | Freq: Three times a day (TID) | ORAL | Status: DC
  Administered 2017-04-11: 1 via ORAL
  Filled 2017-04-11 (×2): qty 1

## 2017-04-11 MED ORDER — ACETAMINOPHEN 325 MG PO TABS
650.0000 mg | ORAL_TABLET | ORAL | Status: DC | PRN
  Administered 2017-04-12 – 2017-04-13 (×3): 650 mg via ORAL
  Filled 2017-04-11 (×3): qty 2

## 2017-04-11 MED ORDER — ALLOPURINOL 300 MG PO TABS
300.0000 mg | ORAL_TABLET | Freq: Every day | ORAL | Status: DC
  Administered 2017-04-11 – 2017-04-15 (×5): 300 mg via ORAL
  Filled 2017-04-11 (×5): qty 1

## 2017-04-11 MED ORDER — ATORVASTATIN CALCIUM 80 MG PO TABS
80.0000 mg | ORAL_TABLET | Freq: Every day | ORAL | Status: DC
  Administered 2017-04-11 – 2017-04-15 (×5): 80 mg via ORAL
  Filled 2017-04-11 (×5): qty 1

## 2017-04-11 MED ORDER — ASPIRIN 81 MG PO CHEW
324.0000 mg | CHEWABLE_TABLET | Freq: Once | ORAL | Status: AC
  Administered 2017-04-11: 324 mg via ORAL
  Filled 2017-04-11: qty 4

## 2017-04-11 MED ORDER — POTASSIUM CHLORIDE CRYS ER 20 MEQ PO TBCR
40.0000 meq | EXTENDED_RELEASE_TABLET | Freq: Two times a day (BID) | ORAL | Status: DC
  Administered 2017-04-11 – 2017-04-14 (×7): 40 meq via ORAL
  Filled 2017-04-11 (×7): qty 2

## 2017-04-11 MED ORDER — SODIUM CHLORIDE 0.9 % IV SOLN
250.0000 mL | INTRAVENOUS | Status: DC | PRN
Start: 2017-04-11 — End: 2017-04-14

## 2017-04-11 MED ORDER — CHLORHEXIDINE GLUCONATE CLOTH 2 % EX PADS
6.0000 | MEDICATED_PAD | Freq: Every day | CUTANEOUS | Status: DC
  Administered 2017-04-12 – 2017-04-15 (×4): 6 via TOPICAL

## 2017-04-11 MED ORDER — HEPARIN SODIUM (PORCINE) 5000 UNIT/ML IJ SOLN
5000.0000 [IU] | Freq: Three times a day (TID) | INTRAMUSCULAR | Status: DC
  Administered 2017-04-11 – 2017-04-15 (×11): 5000 [IU] via SUBCUTANEOUS
  Filled 2017-04-11 (×11): qty 1

## 2017-04-11 MED ORDER — ASPIRIN EC 81 MG PO TBEC
81.0000 mg | DELAYED_RELEASE_TABLET | Freq: Every day | ORAL | Status: DC
  Administered 2017-04-11 – 2017-04-15 (×5): 81 mg via ORAL
  Filled 2017-04-11 (×5): qty 1

## 2017-04-11 MED ORDER — NIFEDIPINE ER 60 MG PO TB24
60.0000 mg | ORAL_TABLET | Freq: Every day | ORAL | Status: DC
  Administered 2017-04-11 – 2017-04-13 (×3): 60 mg via ORAL
  Filled 2017-04-11 (×3): qty 1

## 2017-04-11 MED ORDER — ONDANSETRON HCL 4 MG/2ML IJ SOLN: 4.0000 mg | Freq: Four times a day (QID) | INTRAMUSCULAR | Status: DC | PRN

## 2017-04-11 NOTE — ED Notes (Signed)
Dr. Regenia Skeeter and Roderic Palau RN informed troponin 0.14.

## 2017-04-11 NOTE — ED Triage Notes (Signed)
Pt states he has been short of breath for the past 3 days worse on exertion  Denies swelling in his lower extremities  Denies pain

## 2017-04-11 NOTE — ED Notes (Signed)
Ambulated pt and it was 97% or 98%

## 2017-04-11 NOTE — Progress Notes (Signed)
Patient MRSA PCR positive.  Patient placed on MRSA positive standing orders.

## 2017-04-11 NOTE — ED Provider Notes (Signed)
[place] DEPT Provider Note   CSN: [id number] Arrival date & time: 04/11/17  0358     History   Chief Complaint Chief Complaint  Patient presents with  . Shortness of Breath    HPI Patrick Stewart is a 61 y.o. male.  HPI  61 year old male with a history of CAD status post CABG, COPD, diabetes, and CHF presents with dyspnea. He states he's been having progressive dyspnea over the last 3 days. Occasional cough. No fevers or chest pain. He also notes that he has been having a warm sensation go down his right leg for the last 1 week. Comes and goes. Is not currently present. It feels like when he has had medicines and flushings and an IV before. There is no weakness or numbness in his leg. No back pain. Shortness of breath is worse with exertion and laying down. Denies any missed doses of his furosemide. Otherwise has not been sick. Has noticed mild bilateral leg swelling   Past Medical History:  Diagnosis Date  . CAD (coronary artery disease) 2010   3v CABG  . COPD with chronic bronchitis ([place]) 08/26/2016  . Diabetes mellitus type 2, uncontrolled ([place]) 09/11/2016  . Gout   . Hypertension     Patient Active Problem List   Diagnosis Date Noted  . Diabetes mellitus type 2, uncontrolled ([place]) 09/11/2016  . COPD with chronic bronchitis ([place]) 08/26/2016  . Non-ST elevation MI (NSTEMI) ([place]) 07/08/2016  . Kidney stone on right side 10/25/2015  . CKD (chronic kidney disease) 10/13/2015  . Idiopathic gout of left knee 10/13/2015  . Abnormal EKG 10/13/2015  . CAD Status post CABG 2 SVG to OM 1, LIMA to mid LAD: 10/13/2015  . Cardiomyopathy, ischemic, chronic systolic CHF 40/98/1191  . Acute pericarditis 05/05/2015  . Essential hypertension 09/04/2014  . Benign prostatic hypertrophy with lower urinary tract symptoms (LUTS) 01/24/2013  . Peyronie's disease 12/18/2012  . Chronic joint pain 06/22/2012  . Congestive heart failure ([place]) 01/26/2012  . History of tobacco abuse  01/26/2012  . Inguinal hernia, right 01/26/2012  . Lipid disorder 01/26/2012    Past Surgical History:  Procedure Laterality Date  . CORONARY ARTERY BYPASS GRAFT  2010  . KNEE SURGERY    . ROTATOR CUFF REPAIR    . WRIST SURGERY         Home Medications    Prior to Admission medications   Medication Sig Start Date End Date Taking? Authorizing Provider  albuterol (PROVENTIL) (2.5 MG/3ML) 0.083% nebulizer solution Take 3 mLs (2.5 mg total) by nebulization every 6 (six) hours as needed for wheezing or shortness of breath. 08/26/16  Yes Crosby Oyster Wendling, DO  allopurinol (ZYLOPRIM) 300 MG tablet Take 1 tablet (300 mg total) by mouth 2 (two) times daily. 01/07/17  Yes Crosby Oyster Wendling, DO  atorvastatin (LIPITOR) 80 MG tablet Take 1 tablet (80 mg total) by mouth daily. Patient taking differently: Take 80 mg by mouth every morning.  08/26/16  Yes Crosby Oyster Wendling, DO  carvedilol (COREG) 6.25 MG tablet Take 6.25 mg by mouth 2 (two) times daily. 10/25/15  Yes Historical Provider, MD  colchicine 0.6 MG tablet Take 1 tablet (0.6 mg total) by mouth 2 (two) times daily. Patient taking differently: Take 0.6 mg by mouth 2 (two) times daily as needed (gout).  01/07/17  Yes Crosby Oyster Wendling, DO  famotidine (PEPCID) 20 MG tablet Take 1 tablet (20 mg total) by mouth 2 (two) times daily. Patient taking differently: Take 20 mg  by mouth 2 (two) times daily as needed for heartburn or indigestion.  04/29/16  Yes Dorie Rank, MD  furosemide (LASIX) 20 MG tablet TAKE 3 TABLETS (60 MG TOTAL) BY MOUTH EVERY MORNING. 04/07/17  Yes Crosby Oyster Wendling, DO  lisinopril (PRINIVIL,ZESTRIL) 40 MG tablet Take 1 tablet (40 mg total) by mouth every morning. 01/07/17  Yes Crosby Oyster Wendling, DO  metFORMIN (GLUCOPHAGE) 500 MG tablet Week 1: 1 tab daily Week 2: 1 tab twice daily Week 3: 2 tabs in AM, 1 in evening Week 4: 2 tabs twice daily Patient taking differently: Take 500 mg by mouth 2 (two) times  daily.  01/07/17  Yes Crosby Oyster Wendling, DO  NIFEdipine (PROCARDIA-XL/ADALAT CC) 60 MG 24 hr tablet Take 1 tablet (60 mg total) by mouth daily. 01/07/17  Yes Crosby Oyster Wendling, DO  docusate sodium (COLACE) 100 MG capsule Take 1 capsule (100 mg total) by mouth 2 (two) times daily. Patient not taking: Reported on 04/11/2017 10/02/16   Barton Dubois, MD  glucose blood test strip Use as instructed 08/26/16   Shelda Pal, DO  Lancets (FREESTYLE) lancets Use to check sugars daily 08/26/16   Crosby Oyster Wendling, DO  polyethylene glycol [facility] / GLYCOLAX) packet Take 17 g by mouth daily. Patient not taking: Reported on 04/11/2017 10/03/16   Barton Dubois, MD    Family History Family History  Problem Relation Age of Onset  . Hypertension Mother   . Diabetes Mother   . Hyperlipidemia Mother   . Kidney failure Mother   . Emphysema Father     Social History Social History  Substance Use Topics  . Smoking status: Former Smoker    Packs/day: 0.50    Years: 30.00    Quit date: 07/10/2016  . Smokeless tobacco: Never Used  . Alcohol use No     Allergies   Hydrocodone and Tramadol   Review of Systems Review of Systems  Constitutional: Negative for fever.  Respiratory: Positive for cough and shortness of breath.   Cardiovascular: Positive for leg swelling. Negative for chest pain.  Musculoskeletal: Negative for back pain.  Neurological: Negative for weakness and numbness.  All other systems reviewed and are negative.    Physical Exam Updated Vital Signs BP (!) 161/114 (BP Location: Left Arm)   Pulse 99   Temp 98.6 F (37 C) (Oral)   Resp 20   Ht 5\' 10"  (1.778 m)   Wt 211 lb 4 oz (95.8 kg)   SpO2 94%   BMI 30.31 kg/m   Physical Exam  Constitutional: He is oriented to person, place, and time. He appears well-developed and well-nourished.  HENT:  Head: Normocephalic and atraumatic.  Right Ear: External ear normal.  Left Ear: External ear normal.  Nose:  Nose normal.  Eyes: Right eye exhibits no discharge. Left eye exhibits no discharge.  Neck: Neck supple.  Cardiovascular: Normal rate, regular rhythm and normal heart sounds.   Pulmonary/Chest: No accessory muscle usage. Tachypnea noted. No respiratory distress. He has decreased breath sounds in the right lower field and the left lower field.  Abdominal: Soft. There is no tenderness.  Musculoskeletal: He exhibits edema (1+ pitting edema BLE).  Neurological: He is alert and oriented to person, place, and time.  5/5 strength in BLE. Normal gross sensation  Skin: Skin is warm and dry.  Nursing note and vitals reviewed.    ED Treatments / Results  Labs (all labs ordered are listed, but only abnormal results are displayed) Labs Reviewed  COMPREHENSIVE METABOLIC PANEL - Abnormal; Notable for the following:       Result Value   Glucose, Bld 164 (*)    BUN 22 (*)    Creatinine, Ser 1.53 (*)    GFR calc non Af Amer 47 (*)    GFR calc Af Amer 55 (*)    All other components within normal limits  BRAIN NATRIURETIC PEPTIDE - Abnormal; Notable for the following:    B Natriuretic Peptide 548.9 (*)    All other components within normal limits  TROPONIN I - Abnormal; Notable for the following:    Troponin I 0.14 (*)    All other components within normal limits  CBC WITH DIFFERENTIAL/PLATELET - Abnormal; Notable for the following:    RBC 3.96 (*)    Hemoglobin 12.0 (*)    HCT 36.7 (*)    All other components within normal limits  URIC ACID - Abnormal; Notable for the following:    Uric Acid, Serum 10.5 (*)    All other components within normal limits  MRSA PCR SCREENING  HIV ANTIBODY (ROUTINE TESTING)  CREATININE, SERUM    EKG  EKG Interpretation  Date/Time:  Sunday April 11 2017 05:28:12 EDT Ventricular Rate:  94 PR Interval:    QRS Duration: 185 QT Interval:  416 QTC Calculation: 521 R Axis:   -45 Text Interpretation:  Sinus tachycardia Atrial premature complexes Left atrial  enlargement Left bundle branch block no significant change since Dec 2017 Confirmed by Regenia Skeeter MD, [place]to Batterman [phone]) on 04/11/2017 6:04:00 AM Also confirmed by Regenia Skeeter MD, [place] [phone]), editor Drema Pry [phone])  on 04/11/2017 7:45:35 AM       Radiology Dg Chest 2 View  Result Date: 04/11/2017 CLINICAL DATA:  Acute onset of shortness of breath, worse on exertion. Initial encounter. EXAM: CHEST  2 VIEW COMPARISON:  Chest radiograph performed 12/20/2016 FINDINGS: The lungs are well-aerated. Vascular congestion is noted. Increased interstitial markings raise question for mild interstitial edema. There is no evidence of pleural effusion or pneumothorax. The heart is mildly enlarged. The patient is status post median sternotomy, with evidence of prior CABG. No acute osseous abnormalities are seen. IMPRESSION: Vascular congestion and mild cardiomegaly. Increased interstitial markings raise question for mild interstitial edema. Electronically Signed   By: Garald Balding M.D.   On: 04/11/2017 04:34    Procedures Procedures (including critical care time)  Medications Ordered in ED Medications  albuterol (PROVENTIL) (2.5 MG/3ML) 0.083% nebulizer solution 5 mg (5 mg Nebulization Given 04/11/17 0448)     Initial Impression / Assessment and Plan / ED Course  I have reviewed the triage vital signs and the nursing notes.  Pertinent labs & imaging results that were available during my care of the patient were reviewed by me and considered in my medical decision making (see chart for details).     No distress noted. Has some tachypnea but no accessory muscle use. Appears to have a recurrent CHF exacerbation. Troponin mildly increased from prior. No acute ECG changes. Likely related to the CHF. Given dose of IV lasix in ED. Will consult cardiology for admission  Final Clinical Impressions(s) / ED Diagnoses   Final diagnoses:  Acute systolic heart failure [facility])    New Prescriptions New  Prescriptions   No medications on file     Sherwood Gambler, MD 04/11/17 1319

## 2017-04-11 NOTE — H&P (Addendum)
Chief Complaint:  Dyspnea at rest  Cardiologist: Joya Salm to [place]  HPI:  This is a 61 y.o. male with a past medical history significant for ischemic cardiomyopathy (history of myocardial infarction, CABG 2010, patent LIMA to LAD and SVG to OM by cath 2017 at [facility], EF 31% by MRI in 2017 with extensive inferior scar), previous hospitalization for acute exacerbation of heart failure, COPD, type 2 diabetes mellitus, recurrent gout, systemic hypertension presents with complaints of weight gain, orthopnea for 3 days. Reports that his usual weight is 192 pounds, up to 211 today, but does not weigh himself every day. Generally reports compliance with medications, but has been eating sodium rich foods at restaurants. Denies any recent problems with angina pectoris, palpitations, syncope. Has mild ankle edema. Reports frequent problems with gout, including a hospitalization about 3 months ago with bilateral knee inflammation.  Cardiac MRI last year demonstrated an extensive scar in the inferior wall and a smaller, patchy scar in the basal anterolateral, EF 31%, also showed severe left ventricular hypertrophy. Also demonstrated was mild-moderate aortic insufficiency (regurgitant fraction 22%), lower limits of normal RVEF 47%. Ventricle is markedly dilated. Echo most recent documented PA pressure of 32 mmHg. Possibility of an infiltrative cardiomyopathy was raised due to the severe LVH and restrictive filling pattern, but was not confirmed by MRI.  Used to live in [place] and received medical care at [facility]. Has moved to the [place] area. Has established primary care follow-up with Dr. Nani Ravens at [facility] and was anticipating cardiology follow-up being set up there as well.  He weighed 194 pounds when he saw his primary care doctor on January 11.  PMHx:  Past Medical History:  Diagnosis Date  . CAD (coronary artery disease) 2010   3v CABG  . COPD with chronic bronchitis  ([place]) 08/26/2016  . Diabetes mellitus type 2, uncontrolled ([place]) 09/11/2016  . Gout   . Hypertension     Past Surgical History:  Procedure Laterality Date  . CORONARY ARTERY BYPASS GRAFT  2010  . KNEE SURGERY    . ROTATOR CUFF REPAIR    . WRIST SURGERY      FAMHx:  Family History  Problem Relation Age of Onset  . Hypertension Mother   . Diabetes Mother   . Hyperlipidemia Mother   . Kidney failure Mother   . Emphysema Father     SOCHx:   reports that he quit smoking about 9 months ago. He has a 15.00 pack-year smoking history. He has never used smokeless tobacco. He reports that he does not drink alcohol or use drugs.  ALLERGIES:  Allergies  Allergen Reactions  . Hydrocodone Itching  . Tramadol Nausea Only    ROS: Pertinent items are noted in HPI.  HOME MEDS: No current facility-administered medications on file prior to encounter.    Current Outpatient Prescriptions on File Prior to Encounter  Medication Sig Dispense Refill  . albuterol (PROVENTIL) (2.5 MG/3ML) 0.083% nebulizer solution Take 3 mLs (2.5 mg total) by nebulization every 6 (six) hours as needed for wheezing or shortness of breath. 75 mL 3  . allopurinol (ZYLOPRIM) 300 MG tablet Take 1 tablet (300 mg total) by mouth 2 (two) times daily. 180 tablet 1  . atorvastatin (LIPITOR) 80 MG tablet Take 1 tablet (80 mg total) by mouth daily. (Patient taking differently: Take 80 mg by mouth every morning. ) 30 tablet 11  . carvedilol (COREG) 6.25 MG tablet Take 6.25 mg by mouth 2 (two) times  daily.    . colchicine 0.6 MG tablet Take 1 tablet (0.6 mg total) by mouth 2 (two) times daily. (Patient taking differently: Take 0.6 mg by mouth 2 (two) times daily as needed (gout). ) 180 tablet 1  . famotidine (PEPCID) 20 MG tablet Take 1 tablet (20 mg total) by mouth 2 (two) times daily. (Patient taking differently: Take 20 mg by mouth 2 (two) times daily as needed for heartburn or indigestion. ) 30 tablet 0  . furosemide (LASIX)  20 MG tablet TAKE 3 TABLETS (60 MG TOTAL) BY MOUTH EVERY MORNING. 90 tablet 1  . lisinopril (PRINIVIL,ZESTRIL) 40 MG tablet Take 1 tablet (40 mg total) by mouth every morning. 90 tablet 1  . metFORMIN (GLUCOPHAGE) 500 MG tablet Week 1: 1 tab daily Week 2: 1 tab twice daily Week 3: 2 tabs in AM, 1 in evening Week 4: 2 tabs twice daily (Patient taking differently: Take 500 mg by mouth 2 (two) times daily. ) 120 tablet 1  . NIFEdipine (PROCARDIA-XL/ADALAT CC) 60 MG 24 hr tablet Take 1 tablet (60 mg total) by mouth daily. 30 tablet 1  . docusate sodium (COLACE) 100 MG capsule Take 1 capsule (100 mg total) by mouth 2 (two) times daily. (Patient not taking: Reported on 04/11/2017) 60 capsule 0  . glucose blood test strip Use as instructed 100 each 3  . Lancets (FREESTYLE) lancets Use to check sugars daily 100 each 3  . polyethylene glycol (MIRALAX / GLYCOLAX) packet Take 17 g by mouth daily. (Patient not taking: Reported on 04/11/2017) 28 each 0    LABS/IMAGING: Results for orders placed or performed during the hospital encounter of 04/11/17 (from the past 48 hour(s))  Comprehensive metabolic panel     Status: Abnormal   Collection Time: 04/11/17  5:20 AM  Result Value Ref Range   Sodium 139 135 - 145 mmol/L   Potassium 4.1 3.5 - 5.1 mmol/L   Chloride 105 101 - 111 mmol/L   CO2 24 22 - 32 mmol/L   Glucose, Bld 164 (H) 65 - 99 mg/dL   BUN 22 (H) 6 - 20 mg/dL   Creatinine, Ser 1.53 (H) 0.61 - 1.24 mg/dL   Calcium 9.0 8.9 - 10.3 mg/dL   Total Protein 8.0 6.5 - 8.1 g/dL   Albumin 4.0 3.5 - 5.0 g/dL   AST 28 15 - 41 U/L   ALT 19 17 - 63 U/L   Alkaline Phosphatase 51 38 - 126 U/L   Total Bilirubin 1.2 0.3 - 1.2 mg/dL   GFR calc non Af Amer 47 (L) >60 mL/min   GFR calc Af Amer 55 (L) >60 mL/min    Comment: (NOTE) The eGFR has been calculated using the CKD EPI equation. This calculation has not been validated in all clinical situations. eGFR's persistently <60 mL/min signify possible Chronic  Kidney Disease.    Anion gap 10 5 - 15  Brain natriuretic peptide     Status: Abnormal   Collection Time: 04/11/17  5:20 AM  Result Value Ref Range   B Natriuretic Peptide 548.9 (H) 0.0 - 100.0 pg/mL  Troponin I     Status: Abnormal   Collection Time: 04/11/17  5:20 AM  Result Value Ref Range   Troponin I 0.14 (HH) <0.03 ng/mL    Comment: CRITICAL RESULT CALLED TO, READ BACK BY AND VERIFIED WITH: B JESSEE @ 8828 ON 04/11/17 BY C DAVIS   CBC with Differential     Status: Abnormal   Collection  Time: 04/11/17  5:20 AM  Result Value Ref Range   WBC 6.4 4.0 - 10.5 K/uL   RBC 3.96 (L) 4.22 - 5.81 MIL/uL   Hemoglobin 12.0 (L) 13.0 - 17.0 g/dL   HCT 36.7 (L) 39.0 - 52.0 %   MCV 92.7 78.0 - 100.0 fL   MCH 30.3 26.0 - 34.0 pg   MCHC 32.7 30.0 - 36.0 g/dL   RDW 14.8 11.5 - 15.5 %   Platelets 211 150 - 400 K/uL    Comment: LARGE PLATELETS PRESENT   Neutrophils Relative % 57 %   Neutro Abs 3.6 1.7 - 7.7 K/uL   Lymphocytes Relative 27 %   Lymphs Abs 1.7 0.7 - 4.0 K/uL   Monocytes Relative 13 %   Monocytes Absolute 0.8 0.1 - 1.0 K/uL   Eosinophils Relative 3 %   Eosinophils Absolute 0.2 0.0 - 0.7 K/uL   Basophils Relative 0 %   Basophils Absolute 0.0 0.0 - 0.1 K/uL   Smear Review POLYCHROMASIA PRESENT    Dg Chest 2 View  Result Date: 04/11/2017 CLINICAL DATA:  Acute onset of shortness of breath, worse on exertion. Initial encounter. EXAM: CHEST  2 VIEW COMPARISON:  Chest radiograph performed 12/20/2016 FINDINGS: The lungs are well-aerated. Vascular congestion is noted. Increased interstitial markings raise question for mild interstitial edema. There is no evidence of pleural effusion or pneumothorax. The heart is mildly enlarged. The patient is status post median sternotomy, with evidence of prior CABG. No acute osseous abnormalities are seen. IMPRESSION: Vascular congestion and mild cardiomegaly. Increased interstitial markings raise question for mild interstitial edema. Electronically  Signed   By: Garald Balding M.D.   On: 04/11/2017 04:34    VITALS: Blood pressure (!) 167/119, pulse 82, temperature 98.6 F (37 C), temperature source Oral, resp. rate 19, height '5\' 10"'  (1.778 m), weight 95.8 kg (211 lb 4 oz), SpO2 97 %.  EXAM:  General: Alert, oriented x3, no distress Head: no evidence of trauma, PERRL, EOMI, no exophtalmos or lid lag, no myxedema, no xanthelasma; normal ears, nose and oropharynx Neck: Prominently elevated-10 cm jugular venous pulsations and no hepatojugular reflux; brisk carotid pulses without delay and no carotid bruits Chest: clear to auscultation, no signs of consolidation by percussion or palpation, normal fremitus, symmetrical and full respiratory excursions Cardiovascular: normal position and quality of the apical impulse, regular rhythm with frequent ectopy, normal first heart sound and loud pulmonary component of second heart sounds, no rubs or gallops, no murmur Abdomen: no tenderness or distention, no masses by palpation, no abnormal pulsatility or arterial bruits, normal bowel sounds, no hepatosplenomegaly Extremities: no clubbing, cyanosis; 1+ symmetrical pretibial edema; 2+ radial, ulnar and brachial pulses bilaterally; 2+ right femoral, posterior tibial and dorsalis pedis pulses; 2+ left femoral, posterior tibial and dorsalis pedis pulses; no subclavian or femoral bruits Neurological: grossly nonfocal  ECG: Sinus rhythm with frequent PACs, left bundle branch block, QRS 185 ms. Not meaningfully changed from previous tracings   IMPRESSION:  1. Acute on chronic combined systolic and diastolic heart failure 2. Severe ischemic and hypertensive cardiomyopathy, EF 31% by MRI 2017 3. CAD s/p CABG 4. Mild troponin elevation, likely demand ischemia 5. Severe hypertension w LVH 6. Gout 7. DM  PLAN:  1. Intravenous diuretics 2. Admit to telemetry, preferably 3 E. 3. Consider heart failure service referral 4. Continue carvedilol and ACE  inhibitor; consider switching to entresto based on availability; and hydralazine nitrates, at least until blood pressure control is better. Preferable to current  treatment with calcium channel blocker. 5. Doubt acute coronary syndrome, no indication for full anticoagulation 6. Watch for gout exacerbation, likely to occur with aggressive diuresis 7. Physical exam suggests severe pulmonary hypertension, reevaluate by echo 8. ECG shows left bundle branch block and a fairly broad QRS, but he has primarily ischemic cardiomyopathy. Mixed predictors for benefit from CRT. He does meet criteria for defibrillator implantation for primary prevention.  Sanda Klein, MD, [facility] [phone] office [phone] pager  04/11/2017, 8:37 AM

## 2017-04-12 ENCOUNTER — Inpatient Hospital Stay (HOSPITAL_COMMUNITY): Payer: Medicare Other

## 2017-04-12 DIAGNOSIS — I255 Ischemic cardiomyopathy: Secondary | ICD-10-CM

## 2017-04-12 DIAGNOSIS — I5022 Chronic systolic (congestive) heart failure: Secondary | ICD-10-CM

## 2017-04-12 LAB — BASIC METABOLIC PANEL
Anion gap: 13 (ref 5–15)
BUN: 25 mg/dL — ABNORMAL HIGH (ref 6–20)
CALCIUM: 8.7 mg/dL — AB (ref 8.9–10.3)
CO2: 25 mmol/L (ref 22–32)
CREATININE: 1.82 mg/dL — AB (ref 0.61–1.24)
Chloride: 99 mmol/L — ABNORMAL LOW (ref 101–111)
GFR calc Af Amer: 45 mL/min — ABNORMAL LOW (ref >60)
GFR, EST NON AFRICAN AMERICAN: 38 mL/min — AB (ref >60)
GLUCOSE: 179 mg/dL — AB (ref 65–99)
Potassium: 3.6 mmol/L (ref 3.5–5.1)
SODIUM: 137 mmol/L (ref 135–145)

## 2017-04-12 LAB — ECHOCARDIOGRAM COMPLETE
HEIGHTINCHES: 70.5 in
WEIGHTICAEL: 3296 [oz_av]

## 2017-04-12 LAB — HIV ANTIBODY (ROUTINE TESTING W REFLEX): HIV SCREEN 4TH GENERATION: NONREACTIVE

## 2017-04-12 MED ORDER — ISOSORBIDE MONONITRATE ER 30 MG PO TB24
15.0000 mg | ORAL_TABLET | Freq: Every day | ORAL | Status: DC
  Administered 2017-04-12: 15 mg via ORAL
  Filled 2017-04-12: qty 1

## 2017-04-12 MED ORDER — FUROSEMIDE 10 MG/ML IJ SOLN
40.0000 mg | Freq: Two times a day (BID) | INTRAMUSCULAR | Status: DC
  Administered 2017-04-12 – 2017-04-13 (×2): 40 mg via INTRAVENOUS
  Filled 2017-04-12 (×2): qty 4

## 2017-04-12 MED ORDER — CALCIUM CARBONATE-VITAMIN D 500-200 MG-UNIT PO TABS: 1.0000 | ORAL_TABLET | Freq: Every day | ORAL | Status: DC

## 2017-04-12 MED ORDER — IRBESARTAN 300 MG PO TABS
300.0000 mg | ORAL_TABLET | Freq: Every day | ORAL | Status: DC
Start: 2017-04-12 — End: 2017-04-13
  Administered 2017-04-12 – 2017-04-13 (×2): 300 mg via ORAL
  Filled 2017-04-12 (×2): qty 1

## 2017-04-12 MED ORDER — HYDRALAZINE HCL 25 MG PO TABS
25.0000 mg | ORAL_TABLET | Freq: Three times a day (TID) | ORAL | Status: DC
  Administered 2017-04-12 – 2017-04-13 (×4): 25 mg via ORAL
  Filled 2017-04-12 (×4): qty 1

## 2017-04-12 NOTE — Progress Notes (Signed)
CM following for DCP; awaiting for PT evals for disposition needs; Aneta Mins [phone]

## 2017-04-12 NOTE — Progress Notes (Signed)
Progress Note  Patient Name: Patrick Stewart Date of Encounter: 04/12/2017  Primary Cardiologist: Joya Salm to [facility]  Subjective   Echo Pending for today. He is feeling better, sitting up on side of bed without SOB or CP. He refused Bidil yesterday and today. Reports his PCP took him off because it causes headache. Got a dose last night and has had headache since, improved with Tylenol  Inpatient Medications    Scheduled Meds: . allopurinol  300 mg Oral Daily  . aspirin EC  81 mg Oral Daily  . atorvastatin  80 mg Oral Daily  . calcium-vitamin D  1 tablet Oral Q breakfast  . carvedilol  6.25 mg Oral BID  . Chlorhexidine Gluconate Cloth  6 each Topical Q0600  . colchicine  0.6 mg Oral BID  . furosemide  80 mg Intravenous Q12H  . heparin  5,000 Units Subcutaneous Q8H  . isosorbide-hydrALAZINE  1 tablet Oral TID  . lisinopril  40 mg Oral BH-q7a  . metFORMIN  500 mg Oral BID WC  . mupirocin ointment  1 application Nasal BID  . NIFEdipine  60 mg Oral Daily  . potassium chloride  40 mEq Oral BID  . sodium chloride flush  3 mL Intravenous Q12H   Continuous Infusions:  PRN Meds: sodium chloride, acetaminophen, albuterol, famotidine, ondansetron (ZOFRAN) IV, sodium chloride flush   Vital Signs    Vitals:   04/11/17 1700 04/11/17 2035 04/12/17 0020 04/12/17 0509  BP:  116/81 114/78 115/71  Pulse:  90 75 70  Resp:  18 18 18   Temp: 97.7 F (36.5 C) 99 F (37.2 C) 98.2 F (36.8 C) 98.4 F (36.9 C)  TempSrc:  Oral Oral Oral  SpO2:  94% 94% 97%  Weight:    206 lb (93.4 kg)  Height:        Intake/Output Summary (Last 24 hours) at 04/12/17 1007 Last data filed at 04/12/17 0944  Gross per 24 hour  Intake              843 ml  Output             1550 ml  Net             -707 ml   Filed Weights   04/11/17 0418 04/11/17 1157 04/12/17 0509  Weight: 211 lb 4 oz (95.8 kg) 208 lb 3.2 oz (94.4 kg) 206 lb (93.4 kg)    Telemetry    HR 85, normal sinus rhythm  -  Personally Reviewed  ECG    Sinus rhythm with frequent PACs, left bundle branch block, QRS 185 ms. Not meaningfully changed from previous tracings- Personally Reviewed  Physical Exam   GEN: No acute distress.   Neck: + JVD, no carotid bruirs Cardiac: RRR, no murmurs, rubs, or gallops.  Respiratory: Clear to auscultation bilaterally. GI: Soft, nontender, non-distended  MS: +bilateral LE edema; No deformity. Neuro:  Nonfocal  Psych: Normal affect   Labs    Chemistry  Recent Labs Lab 04/11/17 0520 04/11/17 1437 04/12/17 0308  NA 139  --  137  K 4.1  --  3.6  CL 105  --  99*  CO2 24  --  25  GLUCOSE 164*  --  179*  BUN 22*  --  25*  CREATININE 1.53* 1.60* 1.82*  CALCIUM 9.0  --  8.7*  PROT 8.0  --   --   ALBUMIN 4.0  --   --   AST 28  --   --  ALT 19  --   --   ALKPHOS 51  --   --   BILITOT 1.2  --   --   GFRNONAA 47* 45* 38*  GFRAA 55* 52* 45*  ANIONGAP 10  --  13     Hematology  Recent Labs Lab 04/11/17 0520  WBC 6.4  RBC 3.96*  HGB 12.0*  HCT 36.7*  MCV 92.7  MCH 30.3  MCHC 32.7  RDW 14.8  PLT 211    Cardiac Enzymes  Recent Labs Lab 04/11/17 0520  TROPONINI 0.14*    BNP  Recent Labs Lab 04/11/17 0520  BNP 548.9*      Radiology    Dg Chest 2 View Result Date: 04/11/2017 Vascular congestion and mild cardiomegaly. Increased interstitial markings raise question for mild interstitial edema.   Cardiac Studies   Echo Pending for today  Cardiac MRI (2017)  demonstrated an extensive scar in the inferior wall and a smaller, patchy scar in the basal anterolateral, EF 31%, also showed severe left ventricular hypertrophy. Also demonstrated was mild-moderate aortic insufficiency (regurgitant fraction 22%), lower limits of normal RVEF 47%. Ventricle is markedly dilated. Echo most recent documented PA pressure of 32 mmHg. Possibility of an infiltrative cardiomyopathy was raised due to the severe LVH and restrictive filling pattern, but was not  confirmed by MRI.  Patient Profile    This is a 61 y.o. male with a past medical history significant for chemical cardiomyopathy (history of myocardial infarction, CABG 2010, patent LIMA to LAD and SVG to OM by cath 2017 at [facility], EF 31% by MRI in 2017 with extensive inferior scar), previous hospitalization for acute exacerbation of heart failure, COPD, type 2 diabetes mellitus, recurrent gout, systemic hypertension presents with complaints of weight gain, orthopnea for 3 days. Reports that his usual weight is 192 pounds, up to 211 today, but does not weigh himself every day. Generally reports compliance with medications, but has been eating sodium rich foods at restaurants. Denies any recent problems with angina pectoris, palpitations, syncope. Has mild ankle edema. Reports frequent problems with gout, including a hospitalization about 3 months ago with bilateral knee inflammation.  Used to live in [place] and received medical care at [facility]. Has moved to the [place] area. Has established primary care follow-up with Dr. Nani Ravens at [facility] and was anticipating cardiology follow-up being set up there as well.  He weighed 194 pounds when he saw his primary care doctor on January 11.  Assessment & Plan    IMPRESSION:  1. Acute on chronic combined systolic and diastolic heart failure: Physical exam suggests severe pulmonary hypertension, reevaluate by echo -- receiving 80 mg IV  BID lasix, (1.60 --> 1.82) recommend decreasing dose to IV 60 mg BID. -- echocardiogram is pending, consider heart failure service referral.  -- Carvedilol and Lisinopril, continue. Per Dr. Loletha Grayer, consider switching to [facility];  -- use TID Bidil until BP is under better control, preferably with Nifedipine (CCB).   2. Severe ischemic and hypertensive cardiomyopathy, EF 31% by MRI 2017 3. CAD s/p CABG 4. Mild troponin elevation, likely demand ischemia 5. Severe hypertension w LVH 6. Gout:  Watch for  gout exacerbation, likely to occur with aggressive diuresis 7. DM 8. Hypocalcemia, ordered calcium/Vid supplement per pharmacy  ECG shows LBBB and a fairly broad QRS, but he has primarily ischemic cardiomyopathy. Mixed predictors for benefit from CRT. He does meet criteria for defibrillator implantation for primary prevention, per Dr. Sallyanne Kuster.  Do we need to  consult EP?    Plan: To discuss with MD, recommend decrease lasix due to elevating creatinine (80mg -- 60mg ). Echocardiogram is pending for today. Consider heart failure and EP consults. Blood pressure being controlled well on Carvedilol, Lisinopril,and Nifedipine. Can we discontinue Bidil and start Entresto??? If not, I recommend IV Hydralazine for BP coverage. Will wait for MD recommendations before adjusting medications  Signed, Linus Mako, PA-C  04/12/2017, 10:07 AM     I have seen and examined the patient along with GREENE,TIFFANY G, PA-C .  I have reviewed the chart, notes and new data.  I agree with PA's note.  Key new complaints: greatly improved dyspnea, markedly reduced edema. Still mildly orthopneic when speaking. Key examination changes: edema gone, mild residual JVD (6-8 cm), S3 present. Excellent BP. Key new findings / data: creat increased to 1.8  PLAN: Slow down diuresis to avoid renal insuff. Still about 8-10 lb above estimated dry weight 195-198. Possibly PO diuretics in 24-48 h. Discussed sodium restriction and weight monitoring in detail. Refer to EP as outpt to discuss CRT-D. Try hydralazine TID and isosorbide at night only to get around the headache. Switch from lisinopril to valsartan to simplify outpatient transition to [facility].  Sanda Klein, MD, [place] [phone] 04/12/2017, 11:30 AM

## 2017-04-12 NOTE — Progress Notes (Signed)
  Echocardiogram 2D Echocardiogram has been performed.  Jennette Dubin 04/12/2017, 1:08 PM

## 2017-04-12 NOTE — Progress Notes (Signed)
PT Cancellation Note  Patient Details Name: Patrick Stewart MRN: [id number] DOB: 1956-09-08   Cancelled Treatment:    Reason Eval/Treat Not Completed: Patient at procedure or test/unavailable. Pt currently receiving echo   Patrick Stewart [name] 04/12/2017, 12:41 PM  Elwyn Reach, [place]

## 2017-04-12 NOTE — Evaluation (Signed)
Physical Therapy Evaluation Patient Details Name: Patrick Stewart MRN: [id number] DOB: Feb 27, 1956 Today's Date: 04/12/2017   History of Present Illness  61 year old male with a history of CAD status post CABG, COPD, diabetes, and CHF presents with dyspnea  Clinical Impression  Patient seen for mobility assessment. Patient independent with all aspects of mobility at this time. No further acute PT needs, will sign off.    Follow Up Recommendations No PT follow up    Equipment Recommendations  None recommended by PT    Recommendations for Other Services       Precautions / Restrictions        Mobility  Bed Mobility Overal bed mobility: Independent                Transfers Overall transfer level: Independent                  Ambulation/Gait Ambulation/Gait assistance: Independent Ambulation Distance (Feet): 310 Feet            Stairs            Wheelchair Mobility    Modified Rankin (Stroke Patients Only)       Balance Overall balance assessment: Independent                               Standardized Balance Assessment Standardized Balance Assessment : Dynamic Gait Index   Dynamic Gait Index Level Surface: Normal Change in Gait Speed: Normal Gait with Horizontal Head Turns: Mild Impairment Gait with Vertical Head Turns: Normal Gait and Pivot Turn: Mild Impairment Step Over Obstacle: Mild Impairment Step Around Obstacles: Normal       Pertinent Vitals/Pain      Home Living Family/patient expects to be discharged to:: Private residence Living Arrangements: Spouse/significant other   Type of Home: Apartment Home Access: Elevator     Home Layout: One level Home Equipment: Crutches      Prior Function Level of Independence: Independent               Hand Dominance        Extremity/Trunk Assessment   Upper Extremity Assessment Upper Extremity Assessment: Overall WFL for tasks assessed    Lower  Extremity Assessment Lower Extremity Assessment: Overall WFL for tasks assessed       Communication   Communication: No difficulties  Cognition Arousal/Alertness: Awake/alert Behavior During Therapy: WFL for tasks assessed/performed Overall Cognitive Status: Within Functional Limits for tasks assessed                                        General Comments      Exercises     Assessment/Plan    PT Assessment Patent does not need any further PT services  PT Problem List         PT Treatment Interventions      PT Goals (Current goals can be found in the Care Plan section)  Acute Rehab PT Goals PT Goal Formulation: All assessment and education complete, DC therapy    Frequency     Barriers to discharge        Co-evaluation               End of Session   Activity Tolerance: Patient tolerated treatment well Patient left: in bed;with call bell/phone within reach Nurse Communication: Mobility status PT  Visit Diagnosis: Other (comment) (Shortness of breath)    Time: 6063-0160 PT Time Calculation (min) (ACUTE ONLY): 12 min   Charges:   PT Evaluation $PT Eval Low Complexity: 1 Procedure     PT G Codes:        Alben Deeds, PT DPT  [phone]   Duncan Dull 04/12/2017, 5:41 PM

## 2017-04-13 DIAGNOSIS — N179 Acute kidney failure, unspecified: Secondary | ICD-10-CM

## 2017-04-13 DIAGNOSIS — I5043 Acute on chronic combined systolic (congestive) and diastolic (congestive) heart failure: Secondary | ICD-10-CM

## 2017-04-13 LAB — BASIC METABOLIC PANEL
ANION GAP: 11 (ref 5–15)
BUN: 28 mg/dL — ABNORMAL HIGH (ref 6–20)
CALCIUM: 8.9 mg/dL (ref 8.9–10.3)
CO2: 27 mmol/L (ref 22–32)
Chloride: 100 mmol/L — ABNORMAL LOW (ref 101–111)
Creatinine, Ser: 1.91 mg/dL — ABNORMAL HIGH (ref 0.61–1.24)
GFR, EST AFRICAN AMERICAN: 42 mL/min — AB (ref >60)
GFR, EST NON AFRICAN AMERICAN: 36 mL/min — AB (ref >60)
Glucose, Bld: 155 mg/dL — ABNORMAL HIGH (ref 65–99)
Potassium: 3.9 mmol/L (ref 3.5–5.1)
Sodium: 138 mmol/L (ref 135–145)

## 2017-04-13 LAB — HEMOGLOBIN A1C
Hgb A1c MFr Bld: 7.8 % — ABNORMAL HIGH (ref 4.8–5.6)
MEAN PLASMA GLUCOSE: 177 mg/dL

## 2017-04-13 MED ORDER — FUROSEMIDE 40 MG PO TABS: 40.0000 mg | ORAL_TABLET | Freq: Two times a day (BID) | ORAL | Status: DC

## 2017-04-13 MED ORDER — FUROSEMIDE 10 MG/ML IJ SOLN
80.0000 mg | Freq: Two times a day (BID) | INTRAMUSCULAR | Status: DC
  Administered 2017-04-13: 80 mg via INTRAVENOUS
  Filled 2017-04-13: qty 8

## 2017-04-13 MED ORDER — ISOSORBIDE MONONITRATE ER 30 MG PO TB24: 15.0000 mg | ORAL_TABLET | Freq: Every day | ORAL | Status: DC

## 2017-04-13 MED ORDER — SACUBITRIL-VALSARTAN 24-26 MG PO TABS
1.0000 | ORAL_TABLET | Freq: Two times a day (BID) | ORAL | Status: DC
  Administered 2017-04-13 – 2017-04-15 (×4): 1 via ORAL
  Filled 2017-04-13 (×4): qty 1

## 2017-04-13 NOTE — Progress Notes (Signed)
  Progress Note   Date: 04/13/2017  Patient Name: Patrick Stewart        MRN#: [id number]  Clarification of the stage of CKD:  CKD Stage II   Correct classification would be acute renal insufficiency on stage 2 CKD (baseline creat based on CareEverywhere labs 1.5 , GFR approx 50)  Sanda Klein, MD

## 2017-04-13 NOTE — Consult Note (Signed)
Advanced Heart Failure Team Consult Note   Primary Physician: Dr Rosita Kea Primary Cardiologist:  None  Reason for Consultation: Heart Failure   HPI:    Patrick Stewart is seen today for evaluation of heart failure at the request of Dr Sallyanne Kuster.   Patrick Stewart is a 61 year old AA with history of ICM, CAD s/p CABG x 2 (SVG -> OM1, LIMA -> LAD) in 2010,  COPD, DMII, gout, former smoker, and HTN. Previously followed at [facility] for CAD/CHF but has been lost to follow up.Evaluated in ED 11/2016 with increased dyspnea and received IV lasix. He was later discharged home.   Prior to admit his weight had been trending up to from 192 to 211 pounds. Increased dyspnea on exertion and orthopnea. Denies CP. He had been taking all medications says he does not run out. Able to drive to appointments.   Presented to [facility] ED with increased dyspnea on exertion and weight gain.dmitted with A/C systolic heart failure. CXR concerning for vascular congestion. Pertinent admission labs include: K 4.1, Sodium 139, BNP 548, Hgb 12, Troponin 0.14, and creatinine 1.53. He has been diuresing with IV lasix. Overall weight down 7 pounds. Creatinine trending up . Also started hydralazine and imdur. Imdur later stopped due to headache.   Today he denies CP. Mild dyspnea with exertion.   Cardiac Studies ECHO 04/12/2017 EF 10-15% Dysnchrony  ECHO 2016 EF 30-35%    LHC 2017  Widely patent SVG-OM2 and LIMA-LAD Diffuse small vessel CAD including small PL2 90% - < 2 mm vessel Compared to prior cath in 2016, no clear culprit vessel and overall angiogram appears unchanged to slightly improved  CMRI 2017  Cardiac MRI 07/09/2016 1. The left ventricle is moderately dilated. There is severe concentric hypertrophy. Global systolic function is severely reduced, the LV ejection fraction is 31%. There is thinning and akinesia of the inferior wall. 2. The right ventricle is normal in cavity size and wall thickness. Global systolic function is  lower limits of normal, the RVEF is 47%. There is akinesia of the diaphragmatic wall. 3. Both atria are moderately dilated. 4. The aortic valve is trileaflet with no significant aortic valve stenosis, the peak velocity was 1.3 m/s. There is mild-moderate aortic regurgitation with a regurgitant fraction of 22%.  5. There is mild mitral and moderate tricuspid valve regurgitation, no significant valvular disease.  6. Delayed enhancement imaging demonstrates transmural myocardial infarction in the inferior wall (RCA territory). In addition, mild patchy hyperenhancement in the basal-to-mid anterolateral wall, suggesting possible small acute infarct in the OM or diagonal branch territory. 7. No LV thrombus seen.   Review of Systems: [y] = yes, [ ]  = no   General: Weight gain [ Y]; Weight loss [ ] ; Anorexia [ ] ; Fatigue [ ] ; Fever [ ] ; Chills [ ] ; Weakness [ ]   Cardiac: Chest pain/pressure [ ] ; Resting SOB [ ] ; Exertional SOB [Y]; Orthopnea [ Y]; Pedal Edema [Y ]; Palpitations [ ] ; Syncope [ ] ; Presyncope [ ] ; Paroxysmal nocturnal dyspnea[ ]   Pulmonary: Cough [ ] ; Wheezing[ ] ; Hemoptysis[ ] ; Sputum [ ] ; Snoring [ ]   GI: Vomiting[ ] ; Dysphagia[ ] ; Melena[ ] ; Hematochezia [ ] ; Heartburn[ ] ; Abdominal pain [ ] ; Constipation [ ] ; Diarrhea [ ] ; BRBPR [ ]   GU: Hematuria[ ] ; Dysuria [ ] ; Nocturia[ ]   Vascular: Pain in legs with walking [ ] ; Pain in feet with lying flat [ ] ; Non-healing sores [ ] ; Stroke [ ] ; TIA [ ] ; Slurred speech [ ] ;  Neuro: Headaches[ ] ; Vertigo[ ] ; Seizures[ ] ; Paresthesias[ ] ;Blurred vision [ ] ; Diplopia [ ] ; Vision changes [ ]   Ortho/Skin: Arthritis [ ] ; Joint pain [ ] ; Muscle pain [ ] ; Joint swelling [ ] ; Back Pain [ ] ; Rash [ ]   Psych: Depression[ ] ; Anxiety[ ]   Heme: Bleeding problems [ ] ; Clotting disorders [ ] ; Anemia [ ]   Endocrine: Diabetes [ Y]; Thyroid dysfunction[ ]   Home Medications Prior to Admission medications   Medication Sig Start Date End Date Taking?  Authorizing Provider  albuterol (PROVENTIL) (2.5 MG/3ML) 0.083% nebulizer solution Take 3 mLs (2.5 mg total) by nebulization every 6 (six) hours as needed for wheezing or shortness of breath. 08/26/16  Yes Crosby Oyster Wendling, DO  allopurinol (ZYLOPRIM) 300 MG tablet Take 1 tablet (300 mg total) by mouth 2 (two) times daily. 01/07/17  Yes Crosby Oyster Wendling, DO  atorvastatin (LIPITOR) 80 MG tablet Take 1 tablet (80 mg total) by mouth daily. Patient taking differently: Take 80 mg by mouth every morning.  08/26/16  Yes Crosby Oyster Wendling, DO  carvedilol (COREG) 6.25 MG tablet Take 6.25 mg by mouth 2 (two) times daily. 10/25/15  Yes Historical Provider, MD  colchicine 0.6 MG tablet Take 1 tablet (0.6 mg total) by mouth 2 (two) times daily. Patient taking differently: Take 0.6 mg by mouth 2 (two) times daily as needed (gout).  01/07/17  Yes Crosby Oyster Wendling, DO  famotidine (PEPCID) 20 MG tablet Take 1 tablet (20 mg total) by mouth 2 (two) times daily. Patient taking differently: Take 20 mg by mouth 2 (two) times daily as needed for heartburn or indigestion.  04/29/16  Yes Dorie Rank, MD  furosemide (LASIX) 20 MG tablet TAKE 3 TABLETS (60 MG TOTAL) BY MOUTH EVERY MORNING. 04/07/17  Yes Crosby Oyster Wendling, DO  lisinopril (PRINIVIL,ZESTRIL) 40 MG tablet Take 1 tablet (40 mg total) by mouth every morning. 01/07/17  Yes Crosby Oyster Wendling, DO  metFORMIN (GLUCOPHAGE) 500 MG tablet Week 1: 1 tab daily Week 2: 1 tab twice daily Week 3: 2 tabs in AM, 1 in evening Week 4: 2 tabs twice daily Patient taking differently: Take 500 mg by mouth 2 (two) times daily.  01/07/17  Yes Crosby Oyster Wendling, DO  NIFEdipine (PROCARDIA-XL/ADALAT CC) 60 MG 24 hr tablet Take 1 tablet (60 mg total) by mouth daily. 01/07/17  Yes Crosby Oyster Wendling, DO  docusate sodium (COLACE) 100 MG capsule Take 1 capsule (100 mg total) by mouth 2 (two) times daily. Patient not taking: Reported on 04/11/2017 10/02/16   Barton Dubois, MD  glucose blood test strip Use as instructed 08/26/16   Shelda Pal, DO  Lancets (FREESTYLE) lancets Use to check sugars daily 08/26/16   Crosby Oyster Wendling, DO  polyethylene glycol [facility] / GLYCOLAX) packet Take 17 g by mouth daily. Patient not taking: Reported on 04/11/2017 10/03/16   Barton Dubois, MD    Past Medical History: Past Medical History:  Diagnosis Date  . CAD (coronary artery disease) 2010   3v CABG  . COPD with chronic bronchitis ([place]) 08/26/2016  . Diabetes mellitus type 2, uncontrolled ([place]) 09/11/2016  . Gout   . Hypertension     Past Surgical History: Past Surgical History:  Procedure Laterality Date  . CORONARY ARTERY BYPASS GRAFT  2010  . KNEE SURGERY    . ROTATOR CUFF REPAIR    . WRIST SURGERY      Family History: Family History  Problem Relation Age of Onset  .  Hypertension Mother   . Diabetes Mother   . Hyperlipidemia Mother   . Kidney failure Mother   . Emphysema Father     Social History: Social History   Social History  . Marital status: Married    Spouse name: N/A  . Number of children: N/A  . Years of education: N/A   Occupational History  . disabled    Social History Main Topics  . Smoking status: Former Smoker    Packs/day: 0.50    Years: 30.00    Quit date: 07/10/2016  . Smokeless tobacco: Never Used  . Alcohol use No  . Drug use: No  . Sexual activity: Yes   Other Topics Concern  . None   Social History Narrative  . None    Allergies:  Allergies  Allergen Reactions  . Hydrocodone Itching  . Tramadol Nausea Only    Objective:    Vital Signs:   Temp:  [97.9 F (36.6 C)-98.2 F (36.8 C)] 98.2 F (36.8 C) (04/17 1227) Pulse Rate:  [72-80] 74 (04/17 1227) Resp:  [18] 18 (04/17 1227) BP: (111-135)/(77-86) 115/78 (04/17 1227) SpO2:  [93 %-98 %] 93 % (04/17 1227) Weight:  [204 lb 4.8 oz (92.7 kg)] 204 lb 4.8 oz (92.7 kg) (04/17 0500) Last BM Date: 04/13/17  Weight change: Filed Weights    04/11/17 1157 04/12/17 0509 04/13/17 0500  Weight: 208 lb 3.2 oz (94.4 kg) 206 lb (93.4 kg) 204 lb 4.8 oz (92.7 kg)    Intake/Output:   Intake/Output Summary (Last 24 hours) at 04/13/17 1546 Last data filed at 04/13/17 1148  Gross per 24 hour  Intake              960 ml  Output             2050 ml  Net            -1090 ml     Physical Exam: General:  Well appearing. No resp difficulty. Inb ed  HEENT: normal Neck: supple. JVP ~12. Carotids 2+ bilat; no bruits. No lymphadenopathy or thyromegaly appreciated. Cor: PMI nondisplaced. Regular rate & rhythm. No rubs, gallops or murmurs. Lungs: clear Abdomen: round, soft, nontender, +mild to moderately distended. No hepatosplenomegaly. No bruits or masses. Good bowel sounds. Extremities: no cyanosis, clubbing, rash. Trace ankle edema.  Neuro: alert & orientedx3, cranial nerves grossly intact. moves all 4 extremities w/o difficulty. Affect pleasant Skin: warm  Telemetry: NSR 70s   Labs: Basic Metabolic Panel:  Recent Labs Lab 04/11/17 0520 04/11/17 1437 04/12/17 0308 04/13/17 0333  NA 139  --  137 138  K 4.1  --  3.6 3.9  CL 105  --  99* 100*  CO2 24  --  25 27  GLUCOSE 164*  --  179* 155*  BUN 22*  --  25* 28*  CREATININE 1.53* 1.60* 1.82* 1.91*  CALCIUM 9.0  --  8.7* 8.9    Liver Function Tests:  Recent Labs Lab 04/11/17 0520  AST 28  ALT 19  ALKPHOS 51  BILITOT 1.2  PROT 8.0  ALBUMIN 4.0   No results for input(s): LIPASE, AMYLASE in the last 168 hours. No results for input(s): AMMONIA in the last 168 hours.  CBC:  Recent Labs Lab 04/11/17 0520  WBC 6.4  NEUTROABS 3.6  HGB 12.0*  HCT 36.7*  MCV 92.7  PLT 211    Cardiac Enzymes:  Recent Labs Lab 04/11/17 0520  TROPONINI 0.14*    BNP: BNP (last  3 results)  Recent Labs  08/20/16 0455 12/20/16 0116 04/11/17 0520  BNP 766.2* 791.3* 548.9*    ProBNP (last 3 results) No results for input(s): PROBNP in the last 8760 hours.   CBG: No  results for input(s): GLUCAP in the last 168 hours.  Coagulation Studies: No results for input(s): LABPROT, INR in the last 72 hours.  Other results: EKG:NSR LBBB QRS 185 ms   Imaging:  No results found.   Medications:     Current Medications: . allopurinol  300 mg Oral Daily  . aspirin EC  81 mg Oral Daily  . atorvastatin  80 mg Oral Daily  . carvedilol  6.25 mg Oral BID  . Chlorhexidine Gluconate Cloth  6 each Topical Q0600  . colchicine  0.6 mg Oral BID  . furosemide  40 mg Oral BID  . heparin  5,000 Units Subcutaneous Q8H  . hydrALAZINE  25 mg Oral Q8H  . irbesartan  300 mg Oral Daily  . metFORMIN  500 mg Oral BID WC  . mupirocin ointment  1 application Nasal BID  . potassium chloride  40 mEq Oral BID  . sodium chloride flush  3 mL Intravenous Q12H     Infusions: . sodium chloride        Assessment/Plan   1. A/C Systolic HF, ICM. ECHO 2018 EF 10-15%  NYHA IIIB. Volume status elevated. Give 80 mg IV lasix twice daily.  Continue carvedilol 6.25 mg twice a day. No dig with elevated creatinine Stop irbesartan. Start entresto 24-26 mg twice a day.  Off imdur with headache. Can stop hydralazine.  Add spiro tomorrow.  2. A/C CKD- Baseline creatinine 1.6  3. DMII- Creatinine elevated 1.9  4. LBBB 5. Gout  6. CAD- on statin and aspirin.   Length of Stay: 2  Darrick Grinder, NP  04/13/2017, 3:46 PM  Advanced Heart Failure Team Pager [phone] (M-F; 7a - 4p)  Please contact [facility] for night-coverage after hours (4p -7a ) and weekends on [email or web address]  Patient seen with NP, agree with the above note.   1. Acute on chronic systolic CHF: Ischemic cardiomyopathy.  Echo 04/12/17 EF 15%, moderately dilated LV, inferior and inferolateral akinesis, septal-lateral dyssynchrony (reviewed personally).  EF lower compared to past (31% on 7/17 cardiac MRI).  He was admitted with NYHA class IIIb dyspnea.  Today, still some dyspnea with exertion.  I think that he is still volume  overloaded.  Creatinine up a bit with diuresis to 1.9 (baseline around 1.6).   - Lasix 80 mg IV bid.  - Will stop irbesartan and start Entresto 24/26 bid tomorrow.  - He has failed nitrates x 2 in the past due to headache, would probably hold off hydralazine/nitrates for now.  - Can add spironolactone tomorrow if creatinine stable.  - Fall in EF, should have right and left heart cath but no ACS so can wait until creatinine stable (either this admission or as outpatient).  - We discussed CRT-D placement.  Unless he has something surprising on coronary angiography that can be fixed, will refer to EP as outpatient for CRT-D consideration (wide LBBB).  2. AKI on CKD stage III: Creatinine up to 1.9 from 1.6 baseline.  Follow closely with diuresis.  3. CAD: See cath resport from 7/17, grafts patent at that time.   - Given fall in EF, would like repeat study but will make sure creatinine stable first.  - Continue statin and ASA 81.  4. LBBB: Dyssynchrony on ECG.  Likely would benefit from CRT.   Loralie Champagne 04/13/2017 4:34 PM

## 2017-04-13 NOTE — Progress Notes (Signed)
Patient complains of seeing stars.

## 2017-04-13 NOTE — Progress Notes (Signed)
Progress Note  Patient Name: Patrick Stewart Date of Encounter: 04/13/2017  Primary Cardiologist: Joya Salm to [facility]  Subjective   Isosorbide at night time gave him a severe headache again, he requests either not using it or lower dose. He appears comfortable. No CP or SOB. Echo resulted yesterday with an EF of 10-15%.   Inpatient Medications    Scheduled Meds: . allopurinol  300 mg Oral Daily  . aspirin EC  81 mg Oral Daily  . atorvastatin  80 mg Oral Daily  . carvedilol  6.25 mg Oral BID  . Chlorhexidine Gluconate Cloth  6 each Topical Q0600  . colchicine  0.6 mg Oral BID  . furosemide  40 mg Intravenous Q12H  . heparin  5,000 Units Subcutaneous Q8H  . hydrALAZINE  25 mg Oral Q8H  . irbesartan  300 mg Oral Daily  . isosorbide mononitrate  15 mg Oral QHS  . metFORMIN  500 mg Oral BID WC  . mupirocin ointment  1 application Nasal BID  . potassium chloride  40 mEq Oral BID  . sodium chloride flush  3 mL Intravenous Q12H   Continuous Infusions: . sodium chloride     PRN Meds: sodium chloride, acetaminophen, albuterol, famotidine, ondansetron (ZOFRAN) IV, sodium chloride flush   Vital Signs    Vitals:   04/12/17 1323 04/12/17 2035 04/13/17 0500 04/13/17 0759  BP: 118/86 111/77 116/86 135/86  Pulse: 74 80 76 72  Resp: 18 18 18    Temp: 97.9 F (36.6 C) 98.1 F (36.7 C) 98.2 F (36.8 C) 97.9 F (36.6 C)  TempSrc: Oral Oral Oral Oral  SpO2: 99% 98% 96% 95%  Weight:   204 lb 4.8 oz (92.7 kg)   Height:        Intake/Output Summary (Last 24 hours) at 04/13/17 1104 Last data filed at 04/13/17 0900  Gross per 24 hour  Intake              600 ml  Output             2255 ml  Net            -1655 ml   Filed Weights   04/11/17 1157 04/12/17 0509 04/13/17 0500  Weight: 208 lb 3.2 oz (94.4 kg) 206 lb (93.4 kg) 204 lb 4.8 oz (92.7 kg)    Telemetry    NSR HR 75-80 - Personally Reviewed  ECG    Sinus rhythm with frequent PACs, left bundle branch  block, QRS 185 ms. Not meaningfully changedfrom previous tracings- Personally Reviewed  Physical Exam   GEN:No acute distress.   Neck:+ JVD, no carotid bruirs Cardiac:RRR, no murmurs, rubs, or gallops.  Respiratory:Clear to auscultation bilaterally. QV:ZDGL, nontender, non-distended  MS:+bilateral LE edema; No deformity. Neuro:Nonfocal  Psych: Normal affect   Labs    Chemistry Recent Labs Lab 04/11/17 0520 04/11/17 1437 04/12/17 0308 04/13/17 0333  NA 139  --  137 138  K 4.1  --  3.6 3.9  CL 105  --  99* 100*  CO2 24  --  25 27  GLUCOSE 164*  --  179* 155*  BUN 22*  --  25* 28*  CREATININE 1.53* 1.60* 1.82* 1.91*  CALCIUM 9.0  --  8.7* 8.9  PROT 8.0  --   --   --   ALBUMIN 4.0  --   --   --   AST 28  --   --   --   ALT 19  --   --   --  ALKPHOS 51  --   --   --   BILITOT 1.2  --   --   --   GFRNONAA 47* 45* 38* 36*  GFRAA 55* 52* 45* 42*  ANIONGAP 10  --  13 11     Hematology Recent Labs Lab 04/11/17 0520  WBC 6.4  RBC 3.96*  HGB 12.0*  HCT 36.7*  MCV 92.7  MCH 30.3  MCHC 32.7  RDW 14.8  PLT 211    Cardiac Enzymes Recent Labs Lab 04/11/17 0520  TROPONINI 0.14*    BNP Recent Labs Lab 04/11/17 0520  BNP 548.9*      Radiology    No results found.  Cardiac Studies   Transthoracic Echocardiography 04/12/2017  Study Conclusions  - Left ventricle: The cavity size was severely dilated. Wall   thickness was increased in a pattern of mild LVH. Systolic   function was severely reduced. The estimated ejection fraction   was in the range of 10% to 15%. Diffuse hypokinesis. - Aortic valve: There was moderate regurgitation. - Mitral valve: There was mild regurgitation. - Left atrium: The atrium was moderately dilated. - Right ventricle: The cavity size was mildly dilated. Systolic   function was moderately reduced. - Right atrium: The atrium was mildly to moderately dilated. - Pulmonary arteries: Systolic pressure was moderately  increased.   PA peak pressure: 45 mm Hg (S).   Patient Profile     This is a 61 y.o.malewith a past medical history significant for ischemic cardiomyopathy (history of myocardial infarction, CABG 2010, patent LIMA to LAD and SVG to OM by cath 2017 at [facility], EF 31% by MRI in 2017 with extensive inferior scar), previous hospitalization for acute exacerbation of heart failure, COPD, type 2 diabetes mellitus, recurrent gout, systemic hypertension presents with complaints of weight gain, orthopnea for 3 days. Reports that his usual weight is 192 pounds, up to 211 today, but does not weigh himself every day. Generally reports compliance withmedications, but has been eating sodium rich foods at restaurants. Denies any recent problems with angina pectoris, palpitations, syncope. Has mild ankle edema. Reports frequent problems with gout, including a hospitalization about 3 months ago with bilateral knee inflammation.  Used to live in [place] and received medical care at [facility]. Has moved to the [place] area. Has established primary care follow-up with Dr. Nani Ravens at [facility] and was anticipating cardiology follow-up being set up there as well.  He weighed 194 pounds when he saw his primary care doctor on January 11.  Assessment & Plan     Acute on chronic combined systolic and diastolic heart failure: EF 10-15% on echo from yesterday --  Lasix 40 PO mg BID, discontinued IV lasix (creatinine 1.6 --> 1.82 --> 1.91) --  Recommend heart failure service referral.  --  Recommend EP as inpatient to discuss CRT-D. (LBBB and severe HF) --  Currently on Valsartan with plan to transition to [facility] as an outpatient --  Nifedipine (CCB) discontinued because of severe HF --  Weight up 9 lb (204)  Net loss of 2.3 mL, dry weight 195-198  Severe hypertension w LVH -- Continue Hydralazine TID worked well for patient. -- does not tolerate long term nitrite at bedtime or the bidil, bad  headache  CAD s/p CABG Mild troponin elevation, likely demand ischemia Gout:  Watch for gout exacerbation, likely to occur with aggressive diuresis DM     Plan: To discuss with MD, recommend heart failure team consult and EP  consult inpatient since heart failure is so severe. Discontinued IV lasix and ordered PO 40 mg BID. (creatinine 1.6 --> 1.82 --> 1.91). Does not want Imdur at night, due to severe headaches, can we try decreasing dose?   Signed, Linus Mako, PA-C  04/13/2017, 11:04 AM     I have seen and examined the patient along with GREENE,TIFFANY G, PA-C .  I have reviewed the chart, notes and new data.  I agree with PA's note.  Key new complaints: headache with nitrates again, but he told me it was not intolerable. Will try to continue for a few days since he may develop tolerance. Key examination changes: JVP now normal and edema almost gone, Still has S3 Key new findings / data: creatinine inching up  I have personally reviewed his echo. I believe EF is 25-30% with inferior akinesis, not as bad as reported. Visually, there is substantial septum-lateral wall dyssynchrony.  IVC is now small (probably normal RA pressure), but LV filling pattern is consistent with elevated mean left atrial pressure (pseudonormal bordering on restrictive). Mildly elevated sPAP.  PLAN: Continue hydralazine and low dose nitrate to see if headache tolerance develops. Switch to [facility] tomorrow if BP/renal function permits. CHF consultation. Refer to EP for CRT-D. May need to slow down diuresis, consider RHC to avoid further renal function deterioration (but echo suggests he still has high mean LA pressure). HF Research screening patient for [facility] HF Study.  Sanda Klein, MD, [place] [phone] 04/13/2017, 3:52 PM

## 2017-04-14 LAB — BASIC METABOLIC PANEL
Anion gap: 8 (ref 5–15)
BUN: 29 mg/dL — AB (ref 6–20)
CHLORIDE: 101 mmol/L (ref 101–111)
CO2: 28 mmol/L (ref 22–32)
CREATININE: 1.77 mg/dL — AB (ref 0.61–1.24)
Calcium: 8.9 mg/dL (ref 8.9–10.3)
GFR calc non Af Amer: 40 mL/min — ABNORMAL LOW (ref >60)
GFR, EST AFRICAN AMERICAN: 46 mL/min — AB (ref >60)
GLUCOSE: 164 mg/dL — AB (ref 65–99)
Potassium: 4.2 mmol/L (ref 3.5–5.1)
Sodium: 137 mmol/L (ref 135–145)

## 2017-04-14 MED ORDER — FUROSEMIDE 10 MG/ML IJ SOLN
80.0000 mg | Freq: Once | INTRAMUSCULAR | Status: AC
  Administered 2017-04-14: 80 mg via INTRAVENOUS
  Filled 2017-04-14: qty 8

## 2017-04-14 MED ORDER — SODIUM CHLORIDE 0.9% FLUSH: 3.0000 mL | Freq: Two times a day (BID) | INTRAVENOUS | Status: DC

## 2017-04-14 MED ORDER — ASPIRIN 81 MG PO CHEW
81.0000 mg | CHEWABLE_TABLET | ORAL | Status: AC
  Administered 2017-04-15: 81 mg via ORAL
  Filled 2017-04-14: qty 1

## 2017-04-14 MED ORDER — SODIUM CHLORIDE 0.9 % IV SOLN
INTRAVENOUS | Status: DC
  Administered 2017-04-15: 06:00:00 via INTRAVENOUS

## 2017-04-14 MED ORDER — SODIUM CHLORIDE 0.9 % IV SOLN: 250.0000 mL | INTRAVENOUS | Status: DC | PRN

## 2017-04-14 MED ORDER — SODIUM CHLORIDE 0.9% FLUSH: 3.0000 mL | INTRAVENOUS | Status: DC | PRN

## 2017-04-14 MED ORDER — SODIUM CHLORIDE 0.9 % IV SOLN: INTRAVENOUS | Status: DC

## 2017-04-14 MED ORDER — ASPIRIN 81 MG PO CHEW: 81.0000 mg | CHEWABLE_TABLET | ORAL | Status: DC

## 2017-04-14 NOTE — Research (Signed)
Followed up with patient on potential enrollment in [facility] HF study.  Pt plans to have cardiac cath tomorrow.  Consent still at bedside for pt review.  Will follow up with patient after cath tomorrow.

## 2017-04-14 NOTE — Progress Notes (Signed)
Patient ID: Patrick Stewart, male   DOB: 11-20-1956, 61 y.o.   MRN: [id number]   SUBJECTIVE: Breathing improving.  He diuresed reasonably well yesterday, weight down 2 lbs. Still feels like he has some fluid on him.  Creatinine lower today, 1.77.   Scheduled Meds: . allopurinol  300 mg Oral Daily  . aspirin EC  81 mg Oral Daily  . atorvastatin  80 mg Oral Daily  . carvedilol  6.25 mg Oral BID  . Chlorhexidine Gluconate Cloth  6 each Topical Q0600  . colchicine  0.6 mg Oral BID  . furosemide  80 mg Intravenous Once  . heparin  5,000 Units Subcutaneous Q8H  . metFORMIN  500 mg Oral BID WC  . mupirocin ointment  1 application Nasal BID  . potassium chloride  40 mEq Oral BID  . sacubitril-valsartan  1 tablet Oral BID  . sodium chloride flush  3 mL Intravenous Q12H   Continuous Infusions: . sodium chloride     PRN Meds:.sodium chloride, acetaminophen, albuterol, famotidine, ondansetron (ZOFRAN) IV, sodium chloride flush    Vitals:   04/13/17 0759 04/13/17 1227 04/13/17 2014 04/14/17 0436  BP: 135/86 115/78 115/77 124/68  Pulse: 72 74 73 68  Resp:  18 18 18   Temp: 97.9 F (36.6 C) 98.2 F (36.8 C) 97.7 F (36.5 C) 97.9 F (36.6 C)  TempSrc: Oral Oral Oral Oral  SpO2: 95% 93% 98% 95%  Weight:    202 lb 4.8 oz (91.8 kg)  Height:        Intake/Output Summary (Last 24 hours) at 04/14/17 0758 Last data filed at 04/14/17 0600  Gross per 24 hour  Intake              960 ml  Output             2450 ml  Net            -1490 ml    LABS: Basic Metabolic Panel:  Recent Labs  04/13/17 0333 04/14/17 0403  NA 138 137  K 3.9 4.2  CL 100* 101  CO2 27 28  GLUCOSE 155* 164*  BUN 28* 29*  CREATININE 1.91* 1.77*  CALCIUM 8.9 8.9   Liver Function Tests: No results for input(s): AST, ALT, ALKPHOS, BILITOT, PROT, ALBUMIN in the last 72 hours. No results for input(s): LIPASE, AMYLASE in the last 72 hours. CBC: No results for input(s): WBC, NEUTROABS, HGB, HCT, MCV, PLT in the last  72 hours. Cardiac Enzymes: No results for input(s): CKTOTAL, CKMB, CKMBINDEX, TROPONINI in the last 72 hours. BNP: Invalid input(s): POCBNP D-Dimer: No results for input(s): DDIMER in the last 72 hours. Hemoglobin A1C:  Recent Labs  04/12/17 0308  HGBA1C 7.8*   Fasting Lipid Panel: No results for input(s): CHOL, HDL, LDLCALC, TRIG, CHOLHDL, LDLDIRECT in the last 72 hours. Thyroid Function Tests: No results for input(s): TSH, T4TOTAL, T3FREE, THYROIDAB in the last 72 hours.  Invalid input(s): FREET3 Anemia Panel: No results for input(s): VITAMINB12, FOLATE, FERRITIN, TIBC, IRON, RETICCTPCT in the last 72 hours.  RADIOLOGY: Dg Chest 2 View  Result Date: 04/11/2017 CLINICAL DATA:  Acute onset of shortness of breath, worse on exertion. Initial encounter. EXAM: CHEST  2 VIEW COMPARISON:  Chest radiograph performed 12/20/2016 FINDINGS: The lungs are well-aerated. Vascular congestion is noted. Increased interstitial markings raise question for mild interstitial edema. There is no evidence of pleural effusion or pneumothorax. The heart is mildly enlarged. The patient is status post median sternotomy, with evidence  of prior CABG. No acute osseous abnormalities are seen. IMPRESSION: Vascular congestion and mild cardiomegaly. Increased interstitial markings raise question for mild interstitial edema. Electronically Signed   By: Garald Balding M.D.   On: 04/11/2017 04:34    PHYSICAL EXAM General: NAD Neck: JVP 8-9 cm, no thyromegaly or thyroid nodule.  Lungs: Clear to auscultation bilaterally with normal respiratory effort. CV: Nonpalpable PMI.  Heart regular S1/S2, no S3/S4, no murmur.  No peripheral edema.   Abdomen: Soft, nontender, no hepatosplenomegaly, no distention.  Neurologic: Alert and oriented x 3.  Psych: Normal affect. Extremities: No clubbing or cyanosis.   TELEMETRY: Personally reviewed telemetry pt in NSR  ASSESSMENT AND PLAN:  1. Acute on chronic systolic CHF: Ischemic  cardiomyopathy.  Echo 04/12/17 EF 15%, moderately dilated LV, inferior and inferolateral akinesis, septal-lateral dyssynchrony (reviewed personally).  EF lower compared to past (31% on 7/17 cardiac MRI).  He was admitted with NYHA class IIIb dyspnea.  Today, still some dyspnea with exertion but improving.  Volume status better, still with mild overload.   - Will give 1 dose Lasix 80 mg IV today then stop.  Transition to torsemide 40 daily tomorrow most likely.  - RHC tomorrow.  Discussed risks/benefits with patient and he agrees to proceed.  - Start Entresto 24/26 bid today.  - If BP stable on Entresto, can add spironolactone at low dose tomorrow.   - He has failed nitrates x 2 in the past due to headache, would probably hold off hydralazine/nitrates for now.   - Fall in EF, plan for coronary angiography as long as creatinine remains stable tomorrow (see below). - We discussed CRT-D placement.  Unless he has something surprising on coronary angiography that can be fixed, will refer to EP as outpatient for CRT-D consideration (wide LBBB).  2. AKI on CKD stage II: Creatinine up to 1.9 from 1.5 baseline, 1.77 today.  Follow closely with diuresis.  3. CAD: See cath resport from 7/17, grafts patent at that time.   - Given fall in EF, would like repeat study but will make sure creatinine stable first.  Will plan for tomorrow if creatinine remains roughly where it is.  Discussed risks/benefits with patient and he agrees to proceed.  - Continue statin and ASA 81.  4. LBBB: Dyssynchrony on ECG.  Likely would benefit from CRT.  5. DM: If any rise in creatinine, will need to stop metformin.   Loralie Champagne 04/14/2017 8:05 AM

## 2017-04-14 NOTE — Research (Signed)
Spoke to Dr. Aundra Dubin and Dr. Sallyanne Kuster about screening the patient for [facility] Failure Study. I discussed the study with the patient, gave the patient the consent to review, and answered questions/concerns.  Consent left at bedside.  Will follow up with patient tomorrow.

## 2017-04-15 ENCOUNTER — Encounter (HOSPITAL_COMMUNITY)
Admission: EM | Disposition: A | Discharge status: Home / Self Care | Payer: Self-pay | Source: Home / Self Care | Attending: Cardiovascular Disease

## 2017-04-15 ENCOUNTER — Encounter (HOSPITAL_COMMUNITY): Payer: Self-pay | Admitting: Cardiology

## 2017-04-15 DIAGNOSIS — I2510 Atherosclerotic heart disease of native coronary artery without angina pectoris: Secondary | ICD-10-CM

## 2017-04-15 DIAGNOSIS — N182 Chronic kidney disease, stage 2 (mild): Secondary | ICD-10-CM

## 2017-04-15 DIAGNOSIS — I447 Left bundle-branch block, unspecified: Secondary | ICD-10-CM

## 2017-04-15 DIAGNOSIS — I429 Cardiomyopathy, unspecified: Secondary | ICD-10-CM

## 2017-04-15 HISTORY — PX: RIGHT/LEFT HEART CATH AND CORONARY ANGIOGRAPHY: CATH118266

## 2017-04-15 LAB — PROTIME-INR
INR: 1.12
Prothrombin Time: 14.5 seconds (ref 11.4–15.2)

## 2017-04-15 LAB — CBC
HEMATOCRIT: 40.7 % (ref 39.0–52.0)
Hemoglobin: 13.6 g/dL (ref 13.0–17.0)
MCH: 30.5 pg (ref 26.0–34.0)
MCHC: 33.4 g/dL (ref 30.0–36.0)
MCV: 91.3 fL (ref 78.0–100.0)
PLATELETS: 251 10*3/uL (ref 150–400)
RBC: 4.46 MIL/uL (ref 4.22–5.81)
RDW: 14.5 % (ref 11.5–15.5)
WBC: 4.9 10*3/uL (ref 4.0–10.5)

## 2017-04-15 LAB — BASIC METABOLIC PANEL
Anion gap: 8 (ref 5–15)
BUN: 30 mg/dL — ABNORMAL HIGH (ref 6–20)
CHLORIDE: 102 mmol/L (ref 101–111)
CO2: 26 mmol/L (ref 22–32)
Calcium: 9.2 mg/dL (ref 8.9–10.3)
Creatinine, Ser: 1.72 mg/dL — ABNORMAL HIGH (ref 0.61–1.24)
GFR calc non Af Amer: 41 mL/min — ABNORMAL LOW (ref >60)
GFR, EST AFRICAN AMERICAN: 48 mL/min — AB (ref >60)
Glucose, Bld: 149 mg/dL — ABNORMAL HIGH (ref 65–99)
POTASSIUM: 4.1 mmol/L (ref 3.5–5.1)
Sodium: 136 mmol/L (ref 135–145)

## 2017-04-15 LAB — POCT I-STAT 3, VENOUS BLOOD GAS (G3P V)
Acid-Base Excess: 1 mmol/L (ref 0.0–2.0)
BICARBONATE: 27 mmol/L (ref 20.0–28.0)
BICARBONATE: 27.9 mmol/L (ref 20.0–28.0)
O2 SAT: 61 %
O2 Saturation: 60 %
PCO2 VEN: 53 mmHg (ref 44.0–60.0)
TCO2: 30 mmol/L (ref 0–100)
TCO2: 29 mmol/L (ref 0–100)
pCO2, Ven: 52 mmHg (ref 44.0–60.0)
pH, Ven: 7.323 (ref 7.250–7.430)
pH, Ven: 7.33 (ref 7.250–7.430)
pO2, Ven: 34 mmHg (ref 32.0–45.0)
pO2, Ven: 34 mmHg (ref 32.0–45.0)

## 2017-04-15 SURGERY — RIGHT/LEFT HEART CATH AND CORONARY ANGIOGRAPHY
Anesthesia: LOCAL

## 2017-04-15 MED ORDER — VERAPAMIL HCL 2.5 MG/ML IV SOLN
INTRAVENOUS | Status: DC | PRN
  Administered 2017-04-15: 08:00:00 via INTRA_ARTERIAL

## 2017-04-15 MED ORDER — STUDY - GALACTIC STUDY - PLACEBO / OMECAMTIV MECARBIL (PI-MCLEAN)
1.0000 | Freq: Two times a day (BID) | Status: DC
  Administered 2017-04-15: 1 via ORAL
  Filled 2017-04-15 (×2): qty 1

## 2017-04-15 MED ORDER — MIDAZOLAM HCL 2 MG/2ML IJ SOLN
INTRAMUSCULAR | Status: AC
  Filled 2017-04-15: qty 2

## 2017-04-15 MED ORDER — MIDAZOLAM HCL 2 MG/2ML IJ SOLN
INTRAMUSCULAR | Status: DC | PRN
  Administered 2017-04-15 (×2): 1 mg via INTRAVENOUS

## 2017-04-15 MED ORDER — ACETAMINOPHEN 325 MG PO TABS: 650.0000 mg | ORAL_TABLET | ORAL | Status: DC | PRN

## 2017-04-15 MED ORDER — STUDY - GALACTIC STUDY - PLACEBO / OMECAMTIV MECARBIL (PI-MCLEAN): 1.0000 | Freq: Two times a day (BID) | Status: DC

## 2017-04-15 MED ORDER — IOPAMIDOL (ISOVUE-370) INJECTION 76%
INTRAVENOUS | Status: AC
  Filled 2017-04-15: qty 125

## 2017-04-15 MED ORDER — HEPARIN (PORCINE) IN NACL 2-0.9 UNIT/ML-% IJ SOLN
INTRAMUSCULAR | Status: AC
  Filled 2017-04-15: qty 1000

## 2017-04-15 MED ORDER — DIGOXIN 125 MCG PO TABS: 0.1250 mg | ORAL_TABLET | Freq: Every day | ORAL | 6 refills | Status: DC

## 2017-04-15 MED ORDER — STUDY - INVESTIGATIONAL MEDICATION: 1.0000 | Freq: Two times a day (BID) | 99 refills | Status: DC

## 2017-04-15 MED ORDER — VERAPAMIL HCL 2.5 MG/ML IV SOLN
INTRAVENOUS | Status: AC
  Filled 2017-04-15: qty 2

## 2017-04-15 MED ORDER — FENTANYL CITRATE (PF) 100 MCG/2ML IJ SOLN
INTRAMUSCULAR | Status: AC
  Filled 2017-04-15: qty 2

## 2017-04-15 MED ORDER — SACUBITRIL-VALSARTAN 24-26 MG PO TABS: 1.0000 | ORAL_TABLET | Freq: Two times a day (BID) | ORAL | 6 refills | Status: DC

## 2017-04-15 MED ORDER — SPIRONOLACTONE 25 MG PO TABS
12.5000 mg | ORAL_TABLET | Freq: Every day | ORAL | Status: DC
  Administered 2017-04-15: 12.5 mg via ORAL
  Filled 2017-04-15: qty 1

## 2017-04-15 MED ORDER — SPIRONOLACTONE 25 MG PO TABS: 12.5000 mg | ORAL_TABLET | Freq: Every day | ORAL | 6 refills | Status: DC

## 2017-04-15 MED ORDER — TORSEMIDE 20 MG PO TABS
40.0000 mg | ORAL_TABLET | Freq: Every day | ORAL | Status: DC
  Filled 2017-04-15: qty 2

## 2017-04-15 MED ORDER — HEPARIN (PORCINE) IN NACL 2-0.9 UNIT/ML-% IJ SOLN
INTRAMUSCULAR | Status: DC | PRN
  Administered 2017-04-15: 1000 mL via INTRA_ARTERIAL

## 2017-04-15 MED ORDER — IOPAMIDOL (ISOVUE-370) INJECTION 76%
INTRAVENOUS | Status: DC | PRN
  Administered 2017-04-15: 55 mL via INTRA_ARTERIAL

## 2017-04-15 MED ORDER — LIDOCAINE HCL 1 % IJ SOLN
INTRAMUSCULAR | Status: AC
  Filled 2017-04-15: qty 20

## 2017-04-15 MED ORDER — NITROGLYCERIN 1 MG/10 ML FOR IR/CATH LAB
INTRA_ARTERIAL | Status: AC
  Filled 2017-04-15: qty 10

## 2017-04-15 MED ORDER — ONDANSETRON HCL 4 MG/2ML IJ SOLN: 4.0000 mg | Freq: Four times a day (QID) | INTRAMUSCULAR | Status: DC | PRN

## 2017-04-15 MED ORDER — SODIUM CHLORIDE 0.9% FLUSH: 3.0000 mL | INTRAVENOUS | Status: DC | PRN

## 2017-04-15 MED ORDER — HEPARIN SODIUM (PORCINE) 1000 UNIT/ML IJ SOLN
INTRAMUSCULAR | Status: DC | PRN
  Administered 2017-04-15: 5000 [IU] via INTRAVENOUS

## 2017-04-15 MED ORDER — SODIUM CHLORIDE 0.9 % IV SOLN
INTRAVENOUS | Status: AC
  Administered 2017-04-15: 09:00:00 via INTRAVENOUS

## 2017-04-15 MED ORDER — SODIUM CHLORIDE 0.9% FLUSH: 3.0000 mL | Freq: Two times a day (BID) | INTRAVENOUS | Status: DC

## 2017-04-15 MED ORDER — SODIUM CHLORIDE 0.9 % IV SOLN
INTRAVENOUS | Status: DC | PRN
  Administered 2017-04-15: 10 mL/h via INTRAVENOUS

## 2017-04-15 MED ORDER — ASPIRIN 81 MG PO TBEC: 81.0000 mg | DELAYED_RELEASE_TABLET | Freq: Every day | ORAL | 6 refills | Status: AC

## 2017-04-15 MED ORDER — LIDOCAINE HCL (PF) 1 % IJ SOLN
INTRAMUSCULAR | Status: DC | PRN
  Administered 2017-04-15: 3 mL via INTRADERMAL

## 2017-04-15 MED ORDER — FENTANYL CITRATE (PF) 100 MCG/2ML IJ SOLN
INTRAMUSCULAR | Status: DC | PRN
  Administered 2017-04-15 (×2): 25 ug via INTRAVENOUS

## 2017-04-15 MED ORDER — SODIUM CHLORIDE 0.9 % IV SOLN: 250.0000 mL | INTRAVENOUS | Status: DC | PRN

## 2017-04-15 MED ORDER — DIGOXIN 125 MCG PO TABS
0.1250 mg | ORAL_TABLET | Freq: Every day | ORAL | Status: DC
  Administered 2017-04-15: 0.125 mg via ORAL
  Filled 2017-04-15: qty 1

## 2017-04-15 MED ORDER — HEPARIN SODIUM (PORCINE) 5000 UNIT/ML IJ SOLN: 5000.0000 [IU] | Freq: Three times a day (TID) | INTRAMUSCULAR | Status: DC

## 2017-04-15 MED ORDER — TORSEMIDE 20 MG PO TABS: 40.0000 mg | ORAL_TABLET | Freq: Every day | ORAL | 6 refills | Status: DC

## 2017-04-15 SURGICAL SUPPLY — 12 items
CATH BALLN WEDGE 5F 110CM (CATHETERS) ×2 IMPLANT
CATH INFINITI 5FR MULTPACK ANG (CATHETERS) ×2 IMPLANT
DEVICE RAD COMP TR BAND LRG (VASCULAR PRODUCTS) ×2 IMPLANT
GLIDESHEATH SLEND A-KIT 6F 22G (SHEATH) ×2 IMPLANT
GUIDEWIRE INQWIRE 1.5J.035X260 (WIRE) ×1 IMPLANT
INQWIRE 1.5J .035X260CM (WIRE) ×2
KIT HEART LEFT (KITS) ×2 IMPLANT
PACK CARDIAC CATHETERIZATION (CUSTOM PROCEDURE TRAY) ×2 IMPLANT
SHEATH GLIDE SLENDER 4/5FR (SHEATH) ×2 IMPLANT
TRANSDUCER W/STOPCOCK (MISCELLANEOUS) ×2 IMPLANT
TUBING CIL FLEX 10 FLL-RA (TUBING) ×2 IMPLANT
WIRE MICROINTRODUCER 60CM (WIRE) ×2 IMPLANT

## 2017-04-15 NOTE — Interval H&P Note (Signed)
History and Physical Interval Note:  04/15/2017 7:46 AM  Patrick Stewart  has presented today for surgery, with the diagnosis of hf  The various methods of treatment have been discussed with the patient and family. After consideration of risks, benefits and other options for treatment, the patient has consented to  Procedure(s): Right/Left Heart Cath and Coronary Angiography (N/A) as a surgical intervention .  The patient's history has been reviewed, patient examined, no change in status, stable for surgery.  I have reviewed the patient's chart and labs.  Questions were answered to the patient's satisfaction.     [name] [employer]

## 2017-04-15 NOTE — Research (Signed)
Pt approached to participate in [facility] HF Study per Dr. Aundra Dubin.  Pt reviewed consent and study related questions were answered.  Consent signed before any study procedures initiated.  Vital signs, EKG, urine, blood samples collected and placebo run-in completed without any complications.  All other study procedures for screening completed.  Pt plan to discharge later this afternoon or tomorrow morning per MD.  Pt states no issues with transportation to [facility].

## 2017-04-15 NOTE — H&P (View-Only) (Signed)
Patient ID: Patrick Stewart, male   DOB: 07/02/1956, 61 y.o.   MRN: [id number]   SUBJECTIVE: Breathing improving.  He diuresed reasonably well yesterday, weight down 2 lbs. Still feels like he has some fluid on him.  Creatinine lower today, 1.77.   Scheduled Meds: . allopurinol  300 mg Oral Daily  . aspirin EC  81 mg Oral Daily  . atorvastatin  80 mg Oral Daily  . carvedilol  6.25 mg Oral BID  . Chlorhexidine Gluconate Cloth  6 each Topical Q0600  . colchicine  0.6 mg Oral BID  . furosemide  80 mg Intravenous Once  . heparin  5,000 Units Subcutaneous Q8H  . metFORMIN  500 mg Oral BID WC  . mupirocin ointment  1 application Nasal BID  . potassium chloride  40 mEq Oral BID  . sacubitril-valsartan  1 tablet Oral BID  . sodium chloride flush  3 mL Intravenous Q12H   Continuous Infusions: . sodium chloride     PRN Meds:.sodium chloride, acetaminophen, albuterol, famotidine, ondansetron (ZOFRAN) IV, sodium chloride flush    Vitals:   04/13/17 0759 04/13/17 1227 04/13/17 2014 04/14/17 0436  BP: 135/86 115/78 115/77 124/68  Pulse: 72 74 73 68  Resp:  18 18 18   Temp: 97.9 F (36.6 C) 98.2 F (36.8 C) 97.7 F (36.5 C) 97.9 F (36.6 C)  TempSrc: Oral Oral Oral Oral  SpO2: 95% 93% 98% 95%  Weight:    202 lb 4.8 oz (91.8 kg)  Height:        Intake/Output Summary (Last 24 hours) at 04/14/17 0758 Last data filed at 04/14/17 0600  Gross per 24 hour  Intake              960 ml  Output             2450 ml  Net            -1490 ml    LABS: Basic Metabolic Panel:  Recent Labs  04/13/17 0333 04/14/17 0403  NA 138 137  K 3.9 4.2  CL 100* 101  CO2 27 28  GLUCOSE 155* 164*  BUN 28* 29*  CREATININE 1.91* 1.77*  CALCIUM 8.9 8.9   Liver Function Tests: No results for input(s): AST, ALT, ALKPHOS, BILITOT, PROT, ALBUMIN in the last 72 hours. No results for input(s): LIPASE, AMYLASE in the last 72 hours. CBC: No results for input(s): WBC, NEUTROABS, HGB, HCT, MCV, PLT in the last  72 hours. Cardiac Enzymes: No results for input(s): CKTOTAL, CKMB, CKMBINDEX, TROPONINI in the last 72 hours. BNP: Invalid input(s): POCBNP D-Dimer: No results for input(s): DDIMER in the last 72 hours. Hemoglobin A1C:  Recent Labs  04/12/17 0308  HGBA1C 7.8*   Fasting Lipid Panel: No results for input(s): CHOL, HDL, LDLCALC, TRIG, CHOLHDL, LDLDIRECT in the last 72 hours. Thyroid Function Tests: No results for input(s): TSH, T4TOTAL, T3FREE, THYROIDAB in the last 72 hours.  Invalid input(s): FREET3 Anemia Panel: No results for input(s): VITAMINB12, FOLATE, FERRITIN, TIBC, IRON, RETICCTPCT in the last 72 hours.  RADIOLOGY: Dg Chest 2 View  Result Date: 04/11/2017 CLINICAL DATA:  Acute onset of shortness of breath, worse on exertion. Initial encounter. EXAM: CHEST  2 VIEW COMPARISON:  Chest radiograph performed 12/20/2016 FINDINGS: The lungs are well-aerated. Vascular congestion is noted. Increased interstitial markings raise question for mild interstitial edema. There is no evidence of pleural effusion or pneumothorax. The heart is mildly enlarged. The patient is status post median sternotomy, with evidence  of prior CABG. No acute osseous abnormalities are seen. IMPRESSION: Vascular congestion and mild cardiomegaly. Increased interstitial markings raise question for mild interstitial edema. Electronically Signed   By: Garald Balding M.D.   On: 04/11/2017 04:34    PHYSICAL EXAM General: NAD Neck: JVP 8-9 cm, no thyromegaly or thyroid nodule.  Lungs: Clear to auscultation bilaterally with normal respiratory effort. CV: Nonpalpable PMI.  Heart regular S1/S2, no S3/S4, no murmur.  No peripheral edema.   Abdomen: Soft, nontender, no hepatosplenomegaly, no distention.  Neurologic: Alert and oriented x 3.  Psych: Normal affect. Extremities: No clubbing or cyanosis.   TELEMETRY: Personally reviewed telemetry pt in NSR  ASSESSMENT AND PLAN:  1. Acute on chronic systolic CHF: Ischemic  cardiomyopathy.  Echo 04/12/17 EF 15%, moderately dilated LV, inferior and inferolateral akinesis, septal-lateral dyssynchrony (reviewed personally).  EF lower compared to past (31% on 7/17 cardiac MRI).  He was admitted with NYHA class IIIb dyspnea.  Today, still some dyspnea with exertion but improving.  Volume status better, still with mild overload.   - Will give 1 dose Lasix 80 mg IV today then stop.  Transition to torsemide 40 daily tomorrow most likely.  - RHC tomorrow.  Discussed risks/benefits with patient and he agrees to proceed.  - Start Entresto 24/26 bid today.  - If BP stable on Entresto, can add spironolactone at low dose tomorrow.   - He has failed nitrates x 2 in the past due to headache, would probably hold off hydralazine/nitrates for now.   - Fall in EF, plan for coronary angiography as long as creatinine remains stable tomorrow (see below). - We discussed CRT-D placement.  Unless he has something surprising on coronary angiography that can be fixed, will refer to EP as outpatient for CRT-D consideration (wide LBBB).  2. AKI on CKD stage II: Creatinine up to 1.9 from 1.5 baseline, 1.77 today.  Follow closely with diuresis.  3. CAD: See cath resport from 7/17, grafts patent at that time.   - Given fall in EF, would like repeat study but will make sure creatinine stable first.  Will plan for tomorrow if creatinine remains roughly where it is.  Discussed risks/benefits with patient and he agrees to proceed.  - Continue statin and ASA 81.  4. LBBB: Dyssynchrony on ECG.  Likely would benefit from CRT.  5. DM: If any rise in creatinine, will need to stop metformin.   Loralie Champagne 04/14/2017 8:05 AM

## 2017-04-15 NOTE — Progress Notes (Signed)
Entresto coupon card given to patient with explanation of usage;B Reather Littler, BSN [id number]

## 2017-04-15 NOTE — Discharge Summary (Signed)
Advanced Heart Failure Team  Discharge Summary   Patient ID: Patrick Stewart MRN: [id number], DOB/AGE: April 14, 1956 61 y.o. Admit date: 04/11/2017 D/C date:     04/15/2017   Primary Discharge Diagnoses:  1. A/C Systolic Heart Failure  2. AKI on CKD Stage II 3. CAD 4. LBBB 5. DM  Hospital Course:  Mr Patrick Stewart is a 61 year old AA with history of ICM, CAD s/p CABG x 2 (SVG -> OM1, LIMA -> LAD) in 2010,  COPD, DMII, gout, former smoker, and HTN. Previously followed at [facility] for CAD/CHF but has been lost to follow up.   Presented to [facility] ED with increased dyspnea and edema. Admitted with A/C systolic heart failure. ECHO was completed and showed lower EF ~10% from previous 30-35%. Diuresed with IV lasix and transitioned to torsemide. Overall diuresed 12 pounds. HF meds optimized while he was hospitalized. Had LHC and RHC with stable coronaries as noted below. Plan to follow closely in the HF clinic. We will check BMET and dig level at that time.  He was enrolled in the [place] HT study.   1. Acute on chronic systolic CHF: Ischemic cardiomyopathy. Echo 04/12/17 EF 15%, moderately dilated LV, inferior and inferolateral akinesis, septal-lateral dyssynchrony (reviewed personally). EF lower compared to past (31% on 7/17 cardiac MRI). He was admitted with NYHA class IIIb dyspnea.  Diuresed with IV lasix and transitioned to torsemide.  Also had RHC/LHC with stable coronary disease and adequate cardiac output.  Plan to continue entresto , spiro, coreg,  and digoxin.     - We discussed CRT-D placement. Will try to optimzed meds over the next few months. If EF remains low will need to refer to EPF.  2. AKI on CKD stage II: Creatinine peaked at 1.9. But was down to 1.7 on the day discharge,. Received IV fluids post cath.  3. CAD: Coronary disease stable with patent grafts despite fall in EF.   - Continue statin and ASA 81.  4. LBBB: Dyssynchrony on ECG. Likely would benefit from CRT.  5. DM: If any further rise  in creatinine, will need to stop metformin.    [facility] 04/15/2017  Left Main  30% stenosis.  Left Anterior Descending  90% mid LAD stenosis. Patent LIMA-LAD.  Left Circumflex  Moderate high OM1 with luminal irregularities. Small caliber distal LCx with moderate diffuse disease. 70-80% stenosis proximally in moderate OM2. Patent SVG-OM2.  Right Coronary Artery  30% proximal and mid RCA stenoses.   RA 9 RV 53/11 PA 52/17, mean 35 PCWP mean 14 LV 133/30 AO 136/86 Oxygen saturations: PA 60% AO 97% Cardiac Output (Fick) 4.12  Cardiac Index (Fick) 1.94  Discharge Weight:  198 pounds  Discharge Vitals: Blood pressure 131/84, pulse 73, temperature 97.7 F (36.5 C), temperature source Oral, resp. rate 20, height 5' 10.5" (1.791 m), weight 198 lb 3.2 oz (89.9 kg), SpO2 93 %.  Labs: Lab Results  Component Value Date   WBC 4.9 04/15/2017   HGB 13.6 04/15/2017   HCT 40.7 04/15/2017   MCV 91.3 04/15/2017   PLT 251 04/15/2017    Recent Labs Lab 04/11/17 0520  04/15/17 0348  NA 139  < > 136  K 4.1  < > 4.1  CL 105  < > 102  CO2 24  < > 26  BUN 22*  < > 30*  CREATININE 1.53*  < > 1.72*  CALCIUM 9.0  < > 9.2  PROT 8.0  --   --   BILITOT 1.2  --   --  ALKPHOS 51  --   --   ALT 19  --   --   AST 28  --   --   GLUCOSE 164*  < > 149*  < > = values in this interval not displayed. Lab Results  Component Value Date   CHOL 213 (H) 08/26/2016   HDL 39.70 08/26/2016   TRIG 236.0 (H) 08/26/2016   BNP (last 3 results)  Recent Labs  08/20/16 0455 12/20/16 0116 04/11/17 0520  BNP 766.2* 791.3* 548.9*    ProBNP (last 3 results) No results for input(s): PROBNP in the last 8760 hours.   Diagnostic Studies/Procedures   No results found.  Discharge Medications   Allergies as of 04/15/2017      Reactions   Hydrocodone Itching   Tramadol Nausea Only      Medication List    STOP taking these medications   furosemide 20 MG tablet Commonly known as:  LASIX    lisinopril 40 MG tablet Commonly known as:  PRINIVIL,ZESTRIL   NIFEdipine 60 MG 24 hr tablet Commonly known as:  PROCARDIA-XL/ADALAT CC     TAKE these medications   albuterol (2.5 MG/3ML) 0.083% nebulizer solution Commonly known as:  PROVENTIL Take 3 mLs (2.5 mg total) by nebulization every 6 (six) hours as needed for wheezing or shortness of breath.   allopurinol 300 MG tablet Commonly known as:  ZYLOPRIM Take 1 tablet (300 mg total) by mouth 2 (two) times daily.   aspirin 81 MG EC tablet Take 1 tablet (81 mg total) by mouth daily. Start taking on:  04/16/2017   atorvastatin 80 MG tablet Commonly known as:  LIPITOR Take 1 tablet (80 mg total) by mouth daily. What changed:  when to take this   carvedilol 6.25 MG tablet Commonly known as:  COREG Take 6.25 mg by mouth 2 (two) times daily.   colchicine 0.6 MG tablet Take 1 tablet (0.6 mg total) by mouth 2 (two) times daily. What changed:  when to take this  reasons to take this   digoxin 0.125 MG tablet Commonly known as:  LANOXIN Take 1 tablet (0.125 mg total) by mouth daily. Start taking on:  04/16/2017   docusate sodium 100 MG capsule Commonly known as:  COLACE Take 1 capsule (100 mg total) by mouth 2 (two) times daily.   famotidine 20 MG tablet Commonly known as:  PEPCID Take 1 tablet (20 mg total) by mouth 2 (two) times daily. What changed:  when to take this  reasons to take this   freestyle lancets Use to check sugars daily   glucose blood test strip Use as instructed   Investigational - Study Medication Take 1 tablet by mouth 2 (two) times daily. Study name: Glactiic  Additional study details:   metFORMIN 500 MG tablet Commonly known as:  GLUCOPHAGE Week 1: 1 tab daily Week 2: 1 tab twice daily Week 3: 2 tabs in AM, 1 in evening Week 4: 2 tabs twice daily What changed:  how much to take  how to take this  when to take this  additional instructions   polyethylene glycol packet Commonly  known as:  MIRALAX / GLYCOLAX Take 17 g by mouth daily.   sacubitril-valsartan 24-26 MG Commonly known as:  ENTRESTO Take 1 tablet by mouth 2 (two) times daily.   spironolactone 25 MG tablet Commonly known as:  ALDACTONE Take 0.5 tablets (12.5 mg total) by mouth daily. Start taking on:  04/16/2017   torsemide 20 MG tablet Commonly  known as:  DEMADEX Take 2 tablets (40 mg total) by mouth daily.       Disposition   The patient will be discharged in stable condition to home. Discharge Instructions    (HEART FAILURE PATIENTS) Call MD:  Anytime you have any of the following symptoms: 1) 3 pound weight gain in 24 hours or 5 pounds in 1 week 2) shortness of breath, with or without a dry hacking cough 3) swelling in the hands, feet or stomach 4) if you have to sleep on extra pillows at night in order to breathe.    Complete by:  As directed    Diet - low sodium heart healthy    Complete by:  As directed    Increase activity slowly    Complete by:  As directed      Follow-up Information    Loralie Champagne, MD Follow up on 04/26/2017.   Specialty:  Cardiology Why:  at 8:30 Garage Code [address]000 Contact information: [address] [place] [address] [phone]             Duration of Discharge Encounter: Greater than 35 minutes   Signed, Amy Clegg NP-C  04/15/2017, 3:18 PM

## 2017-04-15 NOTE — Progress Notes (Signed)
Patient ID: Patrick Stewart, male   DOB: 02-Jul-1956, 61 y.o.   MRN: [id number]   SUBJECTIVE: Breathing improving.  Weight down 13 lbs since admission.  Creatinine stable at 1.72.   LHC/RHC   Dominance: Right  Left Main  30% stenosis.  Left Anterior Descending  90% mid LAD stenosis. Patent LIMA-LAD.  Left Circumflex  Moderate high OM1 with luminal irregularities. Small caliber distal LCx with moderate diffuse disease. 70-80% stenosis proximally in moderate OM2. Patent SVG-OM2.  Right Coronary Artery  30% proximal and mid RCA stenoses.  Right Heart   Right Heart Pressures RHC Procedural Findings: Hemodynamics (mmHg) RA 9 RV 53/11 PA 52/17, mean 35 PCWP mean 14 LV 133/30 AO 136/86  Oxygen saturations: PA 60% AO 97%  Cardiac Output (Fick) 4.12  Cardiac Index (Fick) 1.94       Scheduled Meds: . allopurinol  300 mg Oral Daily  . aspirin EC  81 mg Oral Daily  . atorvastatin  80 mg Oral Daily  . carvedilol  6.25 mg Oral BID  . Chlorhexidine Gluconate Cloth  6 each Topical Q0600  . colchicine  0.6 mg Oral BID  . digoxin  0.125 mg Oral Daily  . heparin  5,000 Units Subcutaneous Q8H  . metFORMIN  500 mg Oral BID WC  . mupirocin ointment  1 application Nasal BID  . sacubitril-valsartan  1 tablet Oral BID  . sodium chloride flush  3 mL Intravenous Q12H  . spironolactone  12.5 mg Oral Daily  . torsemide  40 mg Oral Daily   Continuous Infusions: . sodium chloride    . sodium chloride     PRN Meds:.sodium chloride, acetaminophen, albuterol, famotidine, ondansetron (ZOFRAN) IV, sodium chloride flush    Vitals:   04/15/17 0825 04/15/17 0830 04/15/17 0835 04/15/17 0840  BP: (!) 146/87 (!) 145/87 (!) 148/92 (!) 151/89  Pulse: 76 (!) 288 80 73  Resp: (!) 30 16 (!) 26 (!) 41  Temp:      TempSrc:      SpO2: 100% 99% 99% 99%  Weight:      Height:        Intake/Output Summary (Last 24 hours) at 04/15/17 0908 Last data filed at 04/15/17 0646  Gross per 24 hour  Intake            963.17 ml  Output             1775 ml  Net          -811.83 ml    LABS: Basic Metabolic Panel:  Recent Labs  04/14/17 0403 04/15/17 0348  NA 137 136  K 4.2 4.1  CL 101 102  CO2 28 26  GLUCOSE 164* 149*  BUN 29* 30*  CREATININE 1.77* 1.72*  CALCIUM 8.9 9.2   Liver Function Tests: No results for input(s): AST, ALT, ALKPHOS, BILITOT, PROT, ALBUMIN in the last 72 hours. No results for input(s): LIPASE, AMYLASE in the last 72 hours. CBC:  Recent Labs  04/15/17 0348  WBC 4.9  HGB 13.6  HCT 40.7  MCV 91.3  PLT 251   Cardiac Enzymes: No results for input(s): CKTOTAL, CKMB, CKMBINDEX, TROPONINI in the last 72 hours. BNP: Invalid input(s): POCBNP D-Dimer: No results for input(s): DDIMER in the last 72 hours. Hemoglobin A1C: No results for input(s): HGBA1C in the last 72 hours. Fasting Lipid Panel: No results for input(s): CHOL, HDL, LDLCALC, TRIG, CHOLHDL, LDLDIRECT in the last 72 hours. Thyroid Function Tests: No results for input(s): TSH, T4TOTAL,  T3FREE, THYROIDAB in the last 72 hours.  Invalid input(s): FREET3 Anemia Panel: No results for input(s): VITAMINB12, FOLATE, FERRITIN, TIBC, IRON, RETICCTPCT in the last 72 hours.  RADIOLOGY: Dg Chest 2 View  Result Date: 04/11/2017 CLINICAL DATA:  Acute onset of shortness of breath, worse on exertion. Initial encounter. EXAM: CHEST  2 VIEW COMPARISON:  Chest radiograph performed 12/20/2016 FINDINGS: The lungs are well-aerated. Vascular congestion is noted. Increased interstitial markings raise question for mild interstitial edema. There is no evidence of pleural effusion or pneumothorax. The heart is mildly enlarged. The patient is status post median sternotomy, with evidence of prior CABG. No acute osseous abnormalities are seen. IMPRESSION: Vascular congestion and mild cardiomegaly. Increased interstitial markings raise question for mild interstitial edema. Electronically Signed   By: Garald Balding M.D.   On:  04/11/2017 04:34    PHYSICAL EXAM General: NAD Neck: JVP 7 cm, no thyromegaly or thyroid nodule.  Lungs: Clear to auscultation bilaterally with normal respiratory effort. CV: Nonpalpable PMI.  Heart regular S1/S2, no S3/S4, no murmur.  No peripheral edema.   Abdomen: Soft, nontender, no hepatosplenomegaly, no distention.  Neurologic: Alert and oriented x 3.  Psych: Normal affect. Extremities: No clubbing or cyanosis.   TELEMETRY: Personally reviewed telemetry pt in NSR  ASSESSMENT AND PLAN:  1. Acute on chronic systolic CHF: Ischemic cardiomyopathy.  Echo 04/12/17 EF 15%, moderately dilated LV, inferior and inferolateral akinesis, septal-lateral dyssynchrony (reviewed personally).  EF lower compared to past (31% on 7/17 cardiac MRI).  He was admitted with NYHA class IIIb dyspnea.  Volume status better on exam, weight down a total of 13lbs.  Breathing better.  RHC/LHC today showed stable coronary disease and patent grafts, no interventional target.  Filling pressures well-compensated by RHC but LVEDP elevated at 30 (discrepant findings).  Cardiac index marginal at 1.94.  - Will start torsemide 40 mg daily this evening.  - Continue Entresto 24/26 bid today.  - Add spironolactone 12.5 mg daily.    - He has failed nitrates x 2 in the past due to headache, would probably hold off hydralazine/nitrates for now.   - With marginal cardiac output, add digoxin 0.125 daily.  - Continue current Coreg.  - We discussed CRT-D placement.  Unless he has something surprising on coronary angiography that can be fixed, will refer to EP as outpatient for CRT-D consideration (wide LBBB).  2. AKI on CKD stage II: Creatinine up to 1.9 from 1.5 baseline, 1.72 today.  Very gentle hydration post-cath, start torsemide in evening.   3. CAD: Coronary disease stable with patent grafts despite fall in EF.   - Continue statin and ASA 81.  4. LBBB: Dyssynchrony on ECG.  Likely would benefit from CRT.  5. DM: If any further  rise in creatinine, will need to stop metformin.   Will discuss with patient, possibly home in afternoon.   Loralie Champagne 04/15/2017 9:08 AM

## 2017-04-15 NOTE — Progress Notes (Signed)
Patient had no concerns or complaints last night, ready for catheterization. Cath lab called, will be here to get him in "15 min."

## 2017-04-16 ENCOUNTER — Encounter (HOSPITAL_COMMUNITY): Payer: Self-pay | Admitting: *Deleted

## 2017-04-20 ENCOUNTER — Telehealth (HOSPITAL_COMMUNITY): Payer: Self-pay | Admitting: *Deleted

## 2017-04-20 MED ORDER — TORSEMIDE 20 MG PO TABS: 20.0000 mg | ORAL_TABLET | Freq: Every day | ORAL | 3 refills | Status: DC

## 2017-04-20 MED ORDER — CARVEDILOL 6.25 MG PO TABS: 3.1250 mg | ORAL_TABLET | Freq: Two times a day (BID) | ORAL | 3 refills | Status: DC

## 2017-04-20 NOTE — Telephone Encounter (Signed)
New patient to our clinic with first appointment scheduled on 04/26/2017 for post-hospital follow up, called in reporting that his medications are making him feel very tired and has to lay down after taking them.  He said they are just making him feel bad.  He was unsure of which medication was causing him to feel.  I will send message to Barrington Ellison, PA to review patient's medications and will call patient back with his response.

## 2017-04-20 NOTE — Telephone Encounter (Signed)
Cut his coreg in half to 3.125 mg BID and hold torsemide today and decrease to 20 mg daily starting tomorrow.    Thanks.   Legrand Como [address]" [place], PA-C 04/20/2017 10:37 AM

## 2017-04-20 NOTE — Telephone Encounter (Signed)
Called patient back and he is agreeable with plan and will call us back if he starts feeling worse. Medications updated.

## 2017-04-26 ENCOUNTER — Encounter (HOSPITAL_COMMUNITY): Payer: Self-pay

## 2017-04-26 ENCOUNTER — Ambulatory Visit (HOSPITAL_COMMUNITY)
Admit: 2017-04-26 | Discharge: 2017-04-26 | Disposition: A | Discharge status: Home / Self Care | Payer: Medicare Other | Source: Ambulatory Visit | Attending: Cardiology | Admitting: Cardiology

## 2017-04-26 ENCOUNTER — Encounter: Payer: Self-pay | Admitting: *Deleted

## 2017-04-26 VITALS — BP 134/82 | HR 90 | Wt 203.4 lb

## 2017-04-26 VITALS — BP 134/82 | HR 74 | Resp 15 | Wt 201.2 lb

## 2017-04-26 DIAGNOSIS — N179 Acute kidney failure, unspecified: Secondary | ICD-10-CM | POA: Insufficient documentation

## 2017-04-26 DIAGNOSIS — Z885 Allergy status to narcotic agent status: Secondary | ICD-10-CM | POA: Insufficient documentation

## 2017-04-26 DIAGNOSIS — Z825 Family history of asthma and other chronic lower respiratory diseases: Secondary | ICD-10-CM | POA: Insufficient documentation

## 2017-04-26 DIAGNOSIS — Z006 Encounter for examination for normal comparison and control in clinical research program: Secondary | ICD-10-CM

## 2017-04-26 DIAGNOSIS — Z87442 Personal history of urinary calculi: Secondary | ICD-10-CM | POA: Insufficient documentation

## 2017-04-26 DIAGNOSIS — N183 Chronic kidney disease, stage 3 unspecified: Secondary | ICD-10-CM

## 2017-04-26 DIAGNOSIS — M109 Gout, unspecified: Secondary | ICD-10-CM | POA: Diagnosis not present

## 2017-04-26 DIAGNOSIS — I25119 Atherosclerotic heart disease of native coronary artery with unspecified angina pectoris: Secondary | ICD-10-CM

## 2017-04-26 DIAGNOSIS — I255 Ischemic cardiomyopathy: Secondary | ICD-10-CM | POA: Diagnosis not present

## 2017-04-26 DIAGNOSIS — I252 Old myocardial infarction: Secondary | ICD-10-CM | POA: Diagnosis not present

## 2017-04-26 DIAGNOSIS — Z951 Presence of aortocoronary bypass graft: Secondary | ICD-10-CM | POA: Insufficient documentation

## 2017-04-26 DIAGNOSIS — Z79899 Other long term (current) drug therapy: Secondary | ICD-10-CM | POA: Insufficient documentation

## 2017-04-26 DIAGNOSIS — E1122 Type 2 diabetes mellitus with diabetic chronic kidney disease: Secondary | ICD-10-CM | POA: Diagnosis not present

## 2017-04-26 DIAGNOSIS — J449 Chronic obstructive pulmonary disease, unspecified: Secondary | ICD-10-CM | POA: Insufficient documentation

## 2017-04-26 DIAGNOSIS — Z7982 Long term (current) use of aspirin: Secondary | ICD-10-CM | POA: Insufficient documentation

## 2017-04-26 DIAGNOSIS — I2510 Atherosclerotic heart disease of native coronary artery without angina pectoris: Secondary | ICD-10-CM | POA: Insufficient documentation

## 2017-04-26 DIAGNOSIS — I5022 Chronic systolic (congestive) heart failure: Secondary | ICD-10-CM | POA: Diagnosis not present

## 2017-04-26 DIAGNOSIS — E1165 Type 2 diabetes mellitus with hyperglycemia: Secondary | ICD-10-CM

## 2017-04-26 DIAGNOSIS — Z833 Family history of diabetes mellitus: Secondary | ICD-10-CM | POA: Diagnosis not present

## 2017-04-26 DIAGNOSIS — Z8249 Family history of ischemic heart disease and other diseases of the circulatory system: Secondary | ICD-10-CM | POA: Diagnosis not present

## 2017-04-26 DIAGNOSIS — I447 Left bundle-branch block, unspecified: Secondary | ICD-10-CM | POA: Insufficient documentation

## 2017-04-26 DIAGNOSIS — Z87891 Personal history of nicotine dependence: Secondary | ICD-10-CM | POA: Insufficient documentation

## 2017-04-26 DIAGNOSIS — N182 Chronic kidney disease, stage 2 (mild): Secondary | ICD-10-CM | POA: Diagnosis not present

## 2017-04-26 DIAGNOSIS — Z7984 Long term (current) use of oral hypoglycemic drugs: Secondary | ICD-10-CM | POA: Insufficient documentation

## 2017-04-26 DIAGNOSIS — Z95810 Presence of automatic (implantable) cardiac defibrillator: Secondary | ICD-10-CM | POA: Diagnosis not present

## 2017-04-26 DIAGNOSIS — Z841 Family history of disorders of kidney and ureter: Secondary | ICD-10-CM | POA: Insufficient documentation

## 2017-04-26 DIAGNOSIS — IMO0002 Reserved for concepts with insufficient information to code with codable children: Secondary | ICD-10-CM

## 2017-04-26 DIAGNOSIS — I130 Hypertensive heart and chronic kidney disease with heart failure and stage 1 through stage 4 chronic kidney disease, or unspecified chronic kidney disease: Secondary | ICD-10-CM | POA: Diagnosis not present

## 2017-04-26 HISTORY — DX: Pure hypercholesterolemia, unspecified: E78.00

## 2017-04-26 LAB — BASIC METABOLIC PANEL
Anion gap: 7 (ref 5–15)
BUN: 43 mg/dL — AB (ref 6–20)
CALCIUM: 9.2 mg/dL (ref 8.9–10.3)
CHLORIDE: 104 mmol/L (ref 101–111)
CO2: 26 mmol/L (ref 22–32)
CREATININE: 2.05 mg/dL — AB (ref 0.61–1.24)
GFR calc non Af Amer: 33 mL/min — ABNORMAL LOW (ref >60)
GFR, EST AFRICAN AMERICAN: 39 mL/min — AB (ref >60)
GLUCOSE: 180 mg/dL — AB (ref 65–99)
Potassium: 4.6 mmol/L (ref 3.5–5.1)
Sodium: 137 mmol/L (ref 135–145)

## 2017-04-26 LAB — DIGOXIN LEVEL: Digoxin Level: 0.2 ng/mL — ABNORMAL LOW (ref 0.8–2.0)

## 2017-04-26 MED ORDER — CARVEDILOL 3.125 MG PO TABS: 3.1250 mg | ORAL_TABLET | Freq: Two times a day (BID) | ORAL | 6 refills | Status: DC

## 2017-04-26 NOTE — Progress Notes (Deleted)
Pt presented to Research Dept to follow up on Week 2 of [facility] HF Study.  Vital signs taken and BP 145/80 in right arm and 134/82 in left arm.  No signs/symptoms of ACS.  Patient compliant with medications.  Will follow up with patient in 2 weeks.

## 2017-04-26 NOTE — Patient Instructions (Signed)
Continue taking Torsemide 20 mg (1 tab) once daily.  RESTART Carvedilol (Coreg) 3.125 mg (1 tab) twice daily.  Routine lab work today. Will notify you of abnormal results, otherwise no news is good news!  Follow up with Ileene Patrick in 3 weeks.  ___________________________________________________________  ___________________________________________________________  Follow up with Dr. Aundra Dubin in 6-8 weeks.  ___________________________________________________________  ___________________________________________________________  Do the following things EVERYDAY: 1) Weigh yourself in the morning before breakfast. Write it down and keep it in a log. 2) Take your medicines as prescribed 3) Eat low salt foods-Limit salt (sodium) to 2000 mg per day.  4) Stay as active as you can everyday 5) Limit all fluids for the day to less than 2 liters

## 2017-04-26 NOTE — Progress Notes (Signed)
Pt presented to Research Dept to follow up on Week 2 of [facility] HF Study.  Vital signs taken and BP 145/80 in right arm and 134/82 in left arm.  No signs/symptoms of ACS.  Patient compliant with medications.  Will follow up with patient in 2 weeks.

## 2017-04-26 NOTE — Progress Notes (Signed)
Advanced Heart Failure Clinic Note   Primary Care: Dr. Rosita Kea.  Primary Cardiologist: Dr. Aundra Dubin   HPI:  Patrick Stewart is a 61 year old AA with history of ICM, CAD s/p CABG x 2 (SVG -> OM1, LIMA -> LAD)in 2010, COPD, DMII, gout, former smoker, and HTN. Previously followed at [facility] for CAD/CHF but has been lost to follow up.   Admitted 4/15 - 03/26/91 with A/C systolic CHF. Echo with EF 10% from previous 30-35%. Diuresed with IV lasix and transitioned to torsemide. Down 12 lbs overall. Started on [place]. L/RHC with stable coronaries.    Pt enrolled in [place] HF Study.   Pt presents today for post hospital follow up. Called last week with lightheadedness. Torsemide held and then decreased to 20 mg daily.  States he ate a little poorly over the weekend.  Breathing has been great. Denies SOB on flat ground. Denies CP, orthopnea, bendopnea, or palpitations.  He is sleeping much better. Denies lightheadedness or dizziness since medication adjustment. Stopped smoking about 2 months. No ETOH. No drugs. Weight at home 196 - 201.  Social - Lives at home with wife. 2 kids are grown. 9 grandkids. Originally from [place], [place]. Disabled. Worked as Animal nutritionist at [employer].   PMH 1. Chronic systolic CHF: Ischemic cardiomyopathy. Echo 04/12/17 EF 15%, moderately dilated LV, inferior and inferolateral akinesis, septal-lateral dyssynchrony (reviewed personally). EF lower compared to past (31% on 7/17 cardiac MRI).  - RHC/LHC with stable coronary disease and adequate cardiac output.  - Have discussed CRT-D placement. Will continue to optimize meds over next few months. If EF remains low July/August, will need EP referral  2. CKD stage II:  3 CAD: s/p CABG x 2 in 2010 at [facility] - Coronary disease stable with patent grafts on cath 02/2017 despite fall in EF.  4. LBBB: Dyssynchrony on ECG. Likely would benefit from CRT.  5. DM2  [facility] 04/15/2017  Left Main  30% stenosis.  Left Anterior Descending    90% mid LAD stenosis. Patent LIMA-LAD.  Left Circumflex  Moderate high OM1 with luminal irregularities. Small caliber distal LCx with moderate diffuse disease. 70-80% stenosis proximally in moderate OM2. Patent SVG-OM2.  Right Coronary Artery  30% proximal and mid RCA stenoses.   RA 9 RV 53/11 PA 52/17, mean 35 PCWP mean 14 LV 133/30 AO 136/86 Oxygen saturations: PA 60% AO 97% Cardiac Output (Fick) 4.12  Cardiac Index (Fick) 1.94   Past Medical History:  Diagnosis Date  . Angina at rest [facility]) 07/08/2016  . CAD (coronary artery disease) 2010   3v CABG  . Chest pain at rest 07/08/2016  . CHF (congestive heart failure) ([place]) 01/26/2012  . CKD (chronic kidney disease) 02/19/2012  . COPD with chronic bronchitis ([place]) 08/26/2016  . Diabetes mellitus type 2, uncontrolled ([place]) 01/26/2012  . Elevated liver function tests 01/26/2012  . Gout 01/26/2012  . Hx of CABG 2010   RCA Stent 2008, CABG x 2 [facility] 2010  . Hypertension 09/04/2014  . Ischemic cardiomyopathy 11/15/2014  . Kidney stone 10/25/2015  . Lipid disorder 01/26/2012  . NSTEMI (non-ST elevated myocardial infarction) ([place]) 07/08/2016  . Unstable angina ([place]) 11/14/2014    Current Outpatient Prescriptions  Medication Sig Dispense Refill  . albuterol (PROVENTIL) (2.5 MG/3ML) 0.083% nebulizer solution Take 3 mLs (2.5 mg total) by nebulization every 6 (six) hours as needed for wheezing or shortness of breath. 75 mL 3  . allopurinol (ZYLOPRIM) 300 MG tablet Take 1 tablet (300 mg total) by mouth  2 (two) times daily. 180 tablet 1  . aspirin EC 81 MG EC tablet Take 1 tablet (81 mg total) by mouth daily. 30 tablet 6  . atorvastatin (LIPITOR) 80 MG tablet Take 1 tablet (80 mg total) by mouth daily. (Patient taking differently: Take 80 mg by mouth every morning. ) 30 tablet 11  . colchicine 0.6 MG tablet Take 1 tablet (0.6 mg total) by mouth 2 (two) times daily. (Patient taking differently: Take 0.6 mg by mouth 2 (two) times daily  as needed (gout). ) 180 tablet 1  . digoxin (LANOXIN) 0.125 MG tablet Take 1 tablet (0.125 mg total) by mouth daily. 30 tablet 6  . docusate sodium (COLACE) 100 MG capsule Take 1 capsule (100 mg total) by mouth 2 (two) times daily. 60 capsule 0  . famotidine (PEPCID) 20 MG tablet Take 1 tablet (20 mg total) by mouth 2 (two) times daily. (Patient taking differently: Take 20 mg by mouth 2 (two) times daily as needed for heartburn or indigestion. ) 30 tablet 0  . glucose blood test strip Use as instructed 100 each 3  . Investigational - Study Medication Take 1 tablet by mouth 2 (two) times daily. Study name: Glactiic  Additional study details: 1 each PRN  . Lancets (FREESTYLE) lancets Use to check sugars daily 100 each 3  . metFORMIN (GLUCOPHAGE) 500 MG tablet Week 1: 1 tab daily Week 2: 1 tab twice daily Week 3: 2 tabs in AM, 1 in evening Week 4: 2 tabs twice daily (Patient taking differently: Take 500 mg by mouth 2 (two) times daily. ) 120 tablet 1  . polyethylene glycol (MIRALAX / GLYCOLAX) packet Take 17 g by mouth daily. 28 each 0  . sacubitril-valsartan (ENTRESTO) 24-26 MG Take 1 tablet by mouth 2 (two) times daily. 60 tablet 6  . spironolactone (ALDACTONE) 25 MG tablet Take 0.5 tablets (12.5 mg total) by mouth daily. 30 tablet 6  . torsemide (DEMADEX) 20 MG tablet Take 40 mg by mouth daily.     No current facility-administered medications for this encounter.     Allergies  Allergen Reactions  . Hydrocodone Itching  . Tramadol Nausea Only     Social History   Social History  . Marital status: Married    Spouse name: N/A  . Number of children: N/A  . Years of education: N/A   Occupational History  . disabled    Social History Main Topics  . Smoking status: Former Smoker    Packs/day: 0.50    Years: 30.00    Quit date: 07/10/2016  . Smokeless tobacco: Never Used  . Alcohol use No  . Drug use: No  . Sexual activity: Yes   Other Topics Concern  . Not on file   Social  History Narrative  . No narrative on file      Family History  Problem Relation Age of Onset  . Hypertension Mother   . Diabetes Mother   . Hyperlipidemia Mother   . Kidney failure Mother   . Emphysema Father     Vitals:   04/26/17 0841  BP: 134/82  Pulse: 90  SpO2: 100%  Weight: 203 lb 6 oz (92.3 kg)   Wt Readings from Last 3 Encounters:  04/26/17 203 lb 6 oz (92.3 kg)  04/15/17 198 lb 3.2 oz (89.9 kg)  01/07/17 194 lb 3.2 oz (88.1 kg)     PHYSICAL EXAM: General:  Well appearing. No respiratory difficulty HEENT: normal Neck: supple. no JVD. Carotids  2+ bilat; no bruits. No lymphadenopathy or thyromegaly appreciated. Cor: PMI nondisplaced. Regular rate & rhythm. No rubs, gallops or murmurs. Lungs: clear Abdomen: soft, nontender, nondistended. No hepatosplenomegaly. No bruits or masses. Good bowel sounds. Extremities: no cyanosis, clubbing, rash, edema Neuro: alert & oriented x 3, cranial nerves grossly intact. moves all 4 extremities w/o difficulty. Affect pleasant.  ECG: NSR 74 bpm, LVH and LBBB.   ASSESSMENT & PLAN:  1. Chronic systolic CHF: Ischemic cardiomyopathy. Echo 04/12/17 EF 15%, moderately dilated LV, inferior and inferolateral akinesis, septal-lateral dyssynchrony (reviewed personally). EF lower compared to past (31% on 7/17 cardiac MRI).  - RHC/LHC with stable coronary disease and adequate cardiac output.  - Volume status mildly elevated on exam. Take extra 20 mg torsemide today. Take 20 mg torsemide daily with extra 20 mg as needed.  - Continue entresto 24/26 mg BID. Can likely increase at next visit.  - Continue spiro 25 mg daily - Resume coreg 3.25 mg BID.  - Continue digoxin 0.125 mg daily.  - We discussed CRT-D placement. Will continue to optimize meds over next few months. If EF remains low July/August, will need EP referral  2. AKI on CKD stage II:  - Had improved at discharge. BMET today.   3. CAD: Coronary disease stable with patent grafts  despite fall in EF.  - Continue statin and ASA 81. No chest pain.  4. LBBB: Dyssynchrony on ECG. Likely would benefit from CRT.  - Stable by EKG today.  5. DM: - If creatinine rises further will need to stop metformin.  BMET today.    Patrick Friar, PA-C 04/26/17   Greater than 50% of the 25 minute visit was spent in counseling/coordination of care regarding disease state education surrounding HF, LBBB, Diet/fluid restriction, and ICD/CRT-D education.

## 2017-05-12 ENCOUNTER — Encounter: Payer: Self-pay | Admitting: *Deleted

## 2017-05-12 DIAGNOSIS — Z006 Encounter for examination for normal comparison and control in clinical research program: Secondary | ICD-10-CM

## 2017-05-12 NOTE — Progress Notes (Addendum)
Pt presented to Research Dept to follow up on Week 4 of [facility] HF Study.  Vital signs taken.  No signs/symptoms of ACS.  Patient states he is compliant with medications.  Pt failed to return medication boxes.  He states he has an appointment in the HF Clinic and will bring the boxes by the Research Dept at that time.  Additional IP dispensed.  Will follow up with patient in 2 weeks.   05/17/17 - Met pt in hallway prior to appointment with HF clinic.  Pt returned [facility] HF Research IP from Week 4 visit.

## 2017-05-17 ENCOUNTER — Encounter (HOSPITAL_COMMUNITY): Payer: Self-pay

## 2017-05-17 ENCOUNTER — Ambulatory Visit (HOSPITAL_COMMUNITY)
Admission: RE | Admit: 2017-05-17 | Discharge: 2017-05-17 | Disposition: A | Discharge status: Home / Self Care | Payer: Medicare Other | Source: Ambulatory Visit | Attending: Cardiology | Admitting: Cardiology

## 2017-05-17 VITALS — BP 146/78 | HR 85 | Ht 70.0 in | Wt 205.0 lb

## 2017-05-17 DIAGNOSIS — I5022 Chronic systolic (congestive) heart failure: Secondary | ICD-10-CM | POA: Diagnosis not present

## 2017-05-17 LAB — BASIC METABOLIC PANEL
Anion gap: 10 (ref 5–15)
BUN: 29 mg/dL — ABNORMAL HIGH (ref 6–20)
CO2: 22 mmol/L (ref 22–32)
Calcium: 9.1 mg/dL (ref 8.9–10.3)
Chloride: 102 mmol/L (ref 101–111)
Creatinine, Ser: 1.88 mg/dL — ABNORMAL HIGH (ref 0.61–1.24)
GFR calc Af Amer: 43 mL/min — ABNORMAL LOW (ref >60)
GFR, EST NON AFRICAN AMERICAN: 37 mL/min — AB (ref >60)
Glucose, Bld: 272 mg/dL — ABNORMAL HIGH (ref 65–99)
Potassium: 3.9 mmol/L (ref 3.5–5.1)
SODIUM: 134 mmol/L — AB (ref 135–145)

## 2017-05-17 LAB — BRAIN NATRIURETIC PEPTIDE: B Natriuretic Peptide: 315.5 pg/mL — ABNORMAL HIGH (ref 0.0–100.0)

## 2017-05-17 MED ORDER — TORSEMIDE 20 MG PO TABS: 20.0000 mg | ORAL_TABLET | Freq: Every day | ORAL | 5 refills | Status: DC

## 2017-05-17 NOTE — Progress Notes (Deleted)
Met pt in hallway prior to appointment with HF clinic.  Pt returned [facility] HF Research IP from Week 4 visit.

## 2017-05-17 NOTE — Progress Notes (Addendum)
HF MD: [facility]  HPI:  Patrick Stewart is a 61 year old AA with history of ICM, CAD s/p CABG x 2 (SVG -> OM1, LIMA -> LAD)in 2010, COPD, DMII, gout, former smoker, and HTN. Previously followed at [facility] for CAD/CHF but has been lost to follow up.   Admitted 4/15 - 01/20/57 with A/C systolic CHF. Echo with EF 10% from previous 30-35%. Diuresed with IV lasix and transitioned to torsemide. Started on [place]. L/RHC with stable coronaries. Pt enrolled in [place] HF Study.   Pt presents today for pharmacist lead HF medication titration. He last saw Patrick Stewart for post hospital follow up on 04/26/17. At that time Patrick Stewart resumed his Coreg 3.25mg  BID. Pt reported that he had "40mg " of this at home and just took his own. Called pharmacy and they confirmed that patient never filled a prescription. Not sure if pt really had Rx at home. Pt was also taking 25mg  spiro and not 12.5mg . Pt stated that he was adherent to all medications, but pharmacy reported that he was 4 days late refilling his spironolactone and Entresto. Pt states that he does miss some doses, and had not taken any medications today. He denied any lightheadedness, CP, orthopnea, dizziness, or palpitations. Has continued to be abstinent from smoking (quit date February 2018).  Social - Lives at home with wife. 2 kids are grown. 9 grandkids. Originally from [place], [place]. Disabled. Worked as Animal nutritionist at [employer].  . Shortness of breath/dyspnea on exertion? no  . Orthopnea/PND? no . Edema? no . Lightheadedness/dizziness? no . Daily weights at home? Yes - 197-198 . Blood pressure/heart rate monitoring at home? no . Following low-sodium/fluid-restricted diet? no  HF Medications: Spironolactone 25mg  daily Carvedilol 3.125mg  BID Entresto 24-26mg  BID Digoxin 0.125mg  daily Torsemide 20mg  daily  Has the patient been experiencing any side effects to the medications prescribed?  no  Does the patient have any problems obtaining medications due to  transportation or finances?   no  Understanding of regimen: fair Understanding of indications: fair Potential of compliance: fair Patient understands to avoid NSAIDs. Patient understands to avoid decongestants.    Pertinent Lab Values: . 05/17/17: Serum creatinine 1.88 (BL ~1.2-1.3), CO2 22, Potassium 3.9, Sodium 134, BNP 315.5  Vital Signs: . Weight: 205lb (dry weight: 195) - has been weighing 197-198 lbs at home . Blood pressure: 146/78 mmmHg - did not take medications yet today  . Heart rate: 85 bpm  . Vest reading 38%  Assessment: 1. Chronicsystolic CHF (EF 09%), due to ischemic cardiomyopathy. NYHA class I-IIsymptoms. - Volume status mildly elevated on exam, vest reading 38%. Take extra 20 mg torsemide today and tomorrow. Take 20 mg torsemide daily with extra 20 mg as needed.  - Not sure exactly how he is taking medications, or how adherent he has been. Asked patient to bring in all medications to next visit for pharmacy to review. Also counseled him on the importance of taking medications as prescribed. - Continue Entresto 24/26 mg BID, spiro 25 mg daily, coreg 3.25 mg BID, and digoxin 0.125 mg daily. Can hopefully increase Entresto at next visit. - Basic disease state pathophysiology, medication indication, mechanism and side effects reviewed at length with patient and he verbalized understanding 2. AKI on CKD stage II:  - Had improved at discharge. BMET improved today.  3. CAD: Coronary disease stable with patent grafts despite fall in EF.  - Continue statin and ASA 81. No chest pain.  4. LBBB: Dyssynchrony on ECG. Likely would benefit from CRT.  5. DM: per PCP  Plan: 1) Medication changes: Based on clinical presentation, vital signs and recent labs will continue current medications and counsel on importance of adherence to regimen so that we can make sure that doses are safe and effective. Continue Entresto 24/26 mg BID, spiro 25 mg daily, coreg 3.25 mg BID, and digoxin  0.125 mg daily. 2) Labs: BMET today 3) Follow-up: Dr. Aundra Dubin on June 25th, 2018  Cruz Condon, PharmD, [place] PGY2 Pharmacy Resident  Ruta Hinds. Velva Harman, PharmD, BCPS, CPP Clinical Pharmacist Pager: [phone] Phone: [phone] 05/17/2017 1:19 PM

## 2017-05-17 NOTE — Patient Instructions (Signed)
It was great to see you today.  Continue to take your medications as prescribed.  Go and pick up all of your prescriptions today.  We are getting lab work today, we will call if there are any issues.  Next appointment is June 25th, 2018.

## 2017-05-19 ENCOUNTER — Telehealth: Payer: Self-pay | Admitting: Family Medicine

## 2017-05-19 NOTE — Telephone Encounter (Signed)
Would you mind asking pharmacist what his specific concern is regarding colcrys an carvedilol? I saw this at 10:12 last night. Dr. Nani Ravens wrote the rx and not sure if he wants to switch to uloric.(this is mentioned in past note due to pt low gfr)  Looks like written for acute gout flare rather than preventative. If that is the case and pt non symptomatic then we could wait until Dr. Nani Ravens gets back. Looks like he is already on allopurinol.

## 2017-05-19 NOTE — Telephone Encounter (Signed)
[facility] called with a drug interraction to discuss with Dr Nani Ravens. Colcrys and Carvedilol. Call [phone].

## 2017-05-19 NOTE — Telephone Encounter (Signed)
Edward please advise.  

## 2017-05-20 NOTE — Telephone Encounter (Signed)
Called and spoke with Tammy the pharmacist at [facility] regarding the message about the medication interaction.  She stated that the Carvedilol will elevate the level of the Colcrys.  She stated that she call and spoke with Dr. Aundra Dubin office and was told that the pt needs to be on the Carvedilol, so she needs to call and speak with Dr. Nani Ravens regarding the [place].  She also stated that she call and spoke with the pt and he told her that he was taking the Colcrys twice a day.   She said we need to find out what to do.  I called and spoke with the pt and he stated that he was taking the Colcry once daily, but when he leave the hospital this last time he was told to take the Colcrys 0.6mg  twice a day along with the Allopurinol 300 daily.   He stated that he was prescribed the Carvedilol by Dr. Aundra Dubin which he has not yet started because when the prescription was sent to the pharmacy he was sick and could not pick it up.  He said when he went to Dr. Aundra Dubin office last week and had a visit with Burman Nieves the RN and he told her that he had not started the Carvedilol yet, so she call the prescription into the pharmacy.  Then he received a call from [facility] stating that they have received the prescription but there is a drug interaction with the Carvedilol and the Colcrys, and they can not give it to him until they speak with the doctor.  Pt stated that he did not understand what they were talking about.   I tried to explain to the pt what the pharmacy was trying to tell him.   He verbalized understanding.  I asked the pt if he was having a flare up and he stated that he was not.  Pt stated that he is willing to come in to be seen if needed.  Please advise.//AB/CMA

## 2017-05-21 ENCOUNTER — Telehealth: Payer: Self-pay | Admitting: Medical

## 2017-05-21 NOTE — Telephone Encounter (Signed)
Stop colchicine. Start coreg. Come in on Tuesday for appointment. Will get uric acid. Make sure continues allopurinol. After uric acid back may need to switch to uloric?

## 2017-05-21 NOTE — Telephone Encounter (Signed)
Called and spoke with the pt and informed him of the message below.  Pt verbalized understanding and agreed.  Pt was scheduled an office visit for (Tues.05/25/17 @ 8:00am).//AB/CMA

## 2017-05-21 NOTE — Telephone Encounter (Addendum)
Called and spoke with the pt and informed him of the message below.  Pt verbalized understanding and agreed.  Pt was scheduled an office visit for (Tues.05/25/17 @ 8:00am).//AB/CMA

## 2017-05-25 ENCOUNTER — Encounter: Payer: Self-pay | Admitting: Medical

## 2017-05-25 ENCOUNTER — Ambulatory Visit (INDEPENDENT_AMBULATORY_CARE_PROVIDER_SITE_OTHER): Payer: Medicare Other | Admitting: Medical

## 2017-05-25 VITALS — BP 136/78 | HR 72 | Temp 98.3°F | Resp 16 | Ht 70.0 in | Wt 206.0 lb

## 2017-05-25 DIAGNOSIS — I25119 Atherosclerotic heart disease of native coronary artery with unspecified angina pectoris: Secondary | ICD-10-CM | POA: Diagnosis not present

## 2017-05-25 DIAGNOSIS — M109 Gout, unspecified: Secondary | ICD-10-CM

## 2017-05-25 DIAGNOSIS — E1122 Type 2 diabetes mellitus with diabetic chronic kidney disease: Secondary | ICD-10-CM | POA: Diagnosis not present

## 2017-05-25 DIAGNOSIS — I10 Essential (primary) hypertension: Secondary | ICD-10-CM | POA: Diagnosis not present

## 2017-05-25 DIAGNOSIS — I509 Heart failure, unspecified: Secondary | ICD-10-CM

## 2017-05-25 DIAGNOSIS — N183 Chronic kidney disease, stage 3 (moderate): Secondary | ICD-10-CM | POA: Diagnosis not present

## 2017-05-25 DIAGNOSIS — E1165 Type 2 diabetes mellitus with hyperglycemia: Secondary | ICD-10-CM

## 2017-05-25 DIAGNOSIS — IMO0002 Reserved for concepts with insufficient information to code with codable children: Secondary | ICD-10-CM

## 2017-05-25 LAB — COMPREHENSIVE METABOLIC PANEL
ALBUMIN: 4 g/dL (ref 3.5–5.2)
ALT: 11 U/L (ref 0–53)
AST: 16 U/L (ref 0–37)
Alkaline Phosphatase: 49 U/L (ref 39–117)
BUN: 23 mg/dL (ref 6–23)
CALCIUM: 9.3 mg/dL (ref 8.4–10.5)
CHLORIDE: 101 meq/L (ref 96–112)
CO2: 29 mEq/L (ref 19–32)
CREATININE: 1.69 mg/dL — AB (ref 0.40–1.50)
GFR: 53.2 mL/min — AB (ref >60.00)
Glucose, Bld: 174 mg/dL — ABNORMAL HIGH (ref 70–99)
Potassium: 3.9 mEq/L (ref 3.5–5.1)
Sodium: 138 mEq/L (ref 135–145)
Total Bilirubin: 0.8 mg/dL (ref 0.2–1.2)
Total Protein: 7.8 g/dL (ref 6.0–8.3)

## 2017-05-25 LAB — URIC ACID: URIC ACID, SERUM: 9.5 mg/dL — AB (ref 4.0–7.8)

## 2017-05-25 NOTE — Patient Instructions (Addendum)
Your blood pressure and  CHF appears presently stable. Continue current meds.   Regarding your gout I do want to get cmp today and check your uric acid level. Presently I am advising stopping the colcrys since interacts with coreg per pharmacist. Continue allopurinol. After lab review may need to consider uloric. Will advise if taking colcrys ok for brief flare after lab review. Presently advising not to use colcrys preventatively. Will forward lab results to your pcp.  Follow up date to be determined after lab review.

## 2017-05-25 NOTE — Progress Notes (Signed)
Subjective:    Patient ID: Patrick Stewart, male    DOB: [birth year], 61 y.o.   MRN: [id number]  HPI   Pt in for follow up.  Pt was taking colchicine but pharmacist pointed out interaction with coreg. He had just recently started on coreg. Pt last week when he called we advised continue coreg and he is entresto. Pt has history of chf.   Summary from last hospitalizstion 1. Acute on chronic systolic CHF: Ischemic cardiomyopathy. Echo 04/12/17 EF 15%, moderately dilated LV, inferior and inferolateral akinesis, septal-lateral dyssynchrony (reviewed personally). EF lower compared to past (31% on 7/17 cardiac MRI). He was admitted with NYHA class IIIb dyspnea.  Diuresed with IV lasix and transitioned to torsemide.  Also had RHC/LHC with stable coronary disease and adequate cardiac output.  Plan to continue entresto , spiro, coreg,  and digoxin.   - We discussed CRT-D placement. Will try to optimzed meds over the next few months. If EF remains low will need to refer to EPF.  2. AKI on CKD stage II: Creatinine peaked at 1.9. But was down to 1.7 on the day discharge,. Received IV fluids post cath.  3. CAD: Coronary disease stable with patent grafts despite fall in EF.  - Continue statin and ASA 81.  4. LBBB: Dyssynchrony on ECG. Likely would benefit from CRT.   Pt weight only up one pound from last week.  Pt is on turosemide   He has no acute joint pain. So obvious gout flare presently.  Review of Systems  Constitutional: Negative for chills, diaphoresis, fatigue and fever.  HENT: Negative for congestion and ear pain.   Respiratory: Negative for cough, chest tightness, shortness of breath and wheezing.   Cardiovascular: Negative for chest pain and palpitations.  Gastrointestinal: Negative for abdominal pain, constipation and diarrhea.  Musculoskeletal: Negative for back pain.  Neurological: Negative for dizziness and headaches.  Hematological: Negative for adenopathy. Does not  bruise/bleed easily.  Psychiatric/Behavioral: Negative for behavioral problems, confusion, decreased concentration, dysphoric mood and sleep disturbance.    Past Medical History:  Diagnosis Date  . Angina at rest [facility]) 07/08/2016  . CAD (coronary artery disease) 2010   3v CABG  . Chest pain at rest 07/08/2016  . CHF (congestive heart failure) ([place]) 01/26/2012  . CKD (chronic kidney disease) 02/19/2012  . COPD with chronic bronchitis ([place]) 08/26/2016  . Diabetes mellitus type 2, uncontrolled ([place]) 01/26/2012  . Elevated liver function tests 01/26/2012  . Gout 01/26/2012  . Hx of CABG 2010   RCA Stent 2008, CABG x 2 [facility] 2010  . Hypercholesterolemia 07/08/2016  . Hypertension 09/04/2014  . Ischemic cardiomyopathy 11/15/2014  . Kidney stone 10/25/2015  . Lipid disorder 01/26/2012  . NSTEMI (non-ST elevated myocardial infarction) ([place]) 07/08/2016  . Unstable angina ([place]) 11/14/2014     Social History   Social History  . Marital status: Married    Spouse name: N/A  . Number of children: N/A  . Years of education: N/A   Occupational History  . disabled    Social History Main Topics  . Smoking status: Former Smoker    Packs/day: 0.50    Years: 30.00    Quit date: 07/10/2016  . Smokeless tobacco: Never Used  . Alcohol use No  . Drug use: No  . Sexual activity: Yes   Other Topics Concern  . Not on file   Social History Narrative  . No narrative on file    Past Surgical History:  Procedure Laterality Date  .  CORONARY ARTERY BYPASS GRAFT  2010  . KNEE SURGERY    . RIGHT/LEFT HEART CATH AND CORONARY ANGIOGRAPHY N/A 04/15/2017   Procedure: Right/Left Heart Cath and Coronary Angiography;  Surgeon: Larey Dresser, MD;  Location: [place] [facility];  Service: Cardiovascular;  Laterality: N/A;  . ROTATOR CUFF REPAIR    . WRIST SURGERY      Family History  Problem Relation Age of Onset  . Hypertension Mother   . Diabetes Mother   . Hyperlipidemia Mother   . Kidney  failure Mother   . Emphysema Father     Allergies  Allergen Reactions  . Hydrocodone Itching  . Tramadol Nausea Only    Current Outpatient Prescriptions on File Prior to Visit  Medication Sig Dispense Refill  . albuterol (PROVENTIL) (2.5 MG/3ML) 0.083% nebulizer solution Take 3 mLs (2.5 mg total) by nebulization every 6 (six) hours as needed for wheezing or shortness of breath. 75 mL 3  . allopurinol (ZYLOPRIM) 300 MG tablet Take 1 tablet (300 mg total) by mouth 2 (two) times daily. 180 tablet 1  . aspirin EC 81 MG EC tablet Take 1 tablet (81 mg total) by mouth daily. 30 tablet 6  . atorvastatin (LIPITOR) 80 MG tablet Take 1 tablet (80 mg total) by mouth daily. (Patient taking differently: Take 80 mg by mouth every morning. ) 30 tablet 11  . carvedilol (COREG) 3.125 MG tablet Take 1 tablet (3.125 mg total) by mouth 2 (two) times daily. 60 tablet 6  . colchicine 0.6 MG tablet Take 1 tablet (0.6 mg total) by mouth 2 (two) times daily. (Patient taking differently: Take 0.6 mg by mouth 2 (two) times daily as needed (gout). ) 180 tablet 1  . digoxin (LANOXIN) 0.125 MG tablet Take 1 tablet (0.125 mg total) by mouth daily. 30 tablet 6  . docusate sodium (COLACE) 100 MG capsule Take 1 capsule (100 mg total) by mouth 2 (two) times daily. 60 capsule 0  . famotidine (PEPCID) 20 MG tablet Take 1 tablet (20 mg total) by mouth 2 (two) times daily. (Patient taking differently: Take 20 mg by mouth 2 (two) times daily as needed for heartburn or indigestion. ) 30 tablet 0  . glucose blood test strip Use as instructed 100 each 3  . Investigational - Study Medication Take 1 tablet by mouth 2 (two) times daily. Study name: Glactiic  Additional study details: 1 each PRN  . Lancets (FREESTYLE) lancets Use to check sugars daily 100 each 3  . metFORMIN (GLUCOPHAGE) 500 MG tablet Week 1: 1 tab daily Week 2: 1 tab twice daily Week 3: 2 tabs in AM, 1 in evening Week 4: 2 tabs twice daily (Patient taking differently:  Take 500 mg by mouth 2 (two) times daily. ) 120 tablet 1  . polyethylene glycol (MIRALAX / GLYCOLAX) packet Take 17 g by mouth daily. 28 each 0  . sacubitril-valsartan (ENTRESTO) 24-26 MG Take 1 tablet by mouth 2 (two) times daily. 60 tablet 6  . spironolactone (ALDACTONE) 25 MG tablet Take 25 mg by mouth daily.    Marland Kitchen torsemide (DEMADEX) 20 MG tablet Take 1 tablet (20 mg total) by mouth daily. 30 tablet 5   No current facility-administered medications on file prior to visit.     BP (!) 146/87 (BP Location: Right Arm, Patient Position: Sitting, Cuff Size: Normal)   Pulse 72   Temp 98.3 F (36.8 C) (Oral)   Resp 16   Ht 5\' 10"  (1.778 m)  Wt 206 lb (93.4 kg)   SpO2 99%   BMI 29.56 kg/m       Objective:   Physical Exam  General Mental Status- Alert. General Appearance- Not in acute distress.   Skin General: Color- Normal Color. Moisture- Normal Moisture.  Neck Carotid Arteries- Normal color. Moisture- Normal Moisture. No carotid bruits. No JVD.  Chest and Lung Exam Auscultation: Breath Sounds:-Normal.  Cardiovascular Auscultation:Rythm- Regular. Murmurs & Other Heart Sounds:Auscultation of the heart reveals- No Murmurs.  Abdomen Inspection:-Inspeection Normal. Palpation/Percussion:Note:No mass. Palpation and Percussion of the abdomen reveal- Non Tender, Non Distended + BS, no rebound or guarding.    Neurologic Cranial Nerve exam:- CN III-XII intact(No nystagmus), symmetric smile. Strength:- 5/5 equal and symmetric strength both upper and lower extremities.  Joint exam- on palpation an om wrist, knees, elbows and ankles no pain presently.      Assessment & Plan:  Your blood pressure and cCHF appears presently stable. Continue current meds.   Regarding your gout I do want to get cmp today and check your uric acid level. Presently I am advising stopping the colcrys since interacts with coreg per pharmacist. Continue allopurinol. After lab review may need to  consider uloric. Will advise if taking colcrys ok for brief flare after lab review. Presently advising not to use colcrys preventatively. Will forward lab results to your pcp.  Follow up date to be determined after lab review.  Azael Ragain, Percell Miller, PA-C

## 2017-05-27 ENCOUNTER — Encounter: Payer: Self-pay | Admitting: *Deleted

## 2017-05-27 DIAGNOSIS — Z006 Encounter for examination for normal comparison and control in clinical research program: Secondary | ICD-10-CM

## 2017-05-27 NOTE — Progress Notes (Signed)
Pt presented to [facility] to follow up on Week 6 of [facility]. Vital signs taken.  No signs/symptoms of ACS. Patient states he is compliant with IP.   I reviewed Patrick Stewart medications and he stated that he did not stop taking the carvedilol.  He did stop taking the colchicine as advised by his primary MD.  Patrick Stewart is currently having a gout flare up in his left wrist and I advised him to call his primary MD about medications for his gout.  I also stated that he needs to continue taking the Carvedilol.   Will follow up with patient in 2 weeks.

## 2017-06-03 ENCOUNTER — Other Ambulatory Visit: Payer: Self-pay | Admitting: *Deleted

## 2017-06-03 MED ORDER — STUDY - INVESTIGATIONAL MEDICATION: 1.0000 | Freq: Two times a day (BID) | 99 refills | Status: DC

## 2017-06-09 ENCOUNTER — Encounter: Payer: Self-pay | Admitting: *Deleted

## 2017-06-09 DIAGNOSIS — Z006 Encounter for examination for normal comparison and control in clinical research program: Secondary | ICD-10-CM

## 2017-06-09 NOTE — Progress Notes (Signed)
RESEARCH ENCOUNTER  Patient ID: Patrick Stewart  DOB: 1956/08/18  Patrick Stewart presented to the [facility] for Week 8 visit of the [facility].  No signs/symptoms of ACS since the last visit. Participant states compliance with IP, IP returned and additional IP dispensed.    Blood pressure 140/79, pulse 72, resp. rate 12, weight 199 lb 3.2 oz (90.4 kg), SpO2 100 %.  Patient will follow up with [facility] in 4 weeks.

## 2017-06-18 ENCOUNTER — Encounter: Payer: Self-pay | Admitting: *Deleted

## 2017-06-21 ENCOUNTER — Encounter (HOSPITAL_COMMUNITY): Payer: Medicare Other

## 2017-07-06 ENCOUNTER — Encounter: Payer: Self-pay | Admitting: *Deleted

## 2017-07-06 DIAGNOSIS — Z006 Encounter for examination for normal comparison and control in clinical research program: Secondary | ICD-10-CM

## 2017-07-06 NOTE — Progress Notes (Signed)
RESEARCH ENCOUNTER  Patient ID: Patrick Stewart  DOB: 23-Feb-1956  [name] presented to the [facility] for Week 12 visit of the [facility].  No signs/symptoms of ACS since the last visit. Pt states that he "feel(s) great."  Participant states compliance with IP, IP returned and additional IP dispensed.    Blood pressure 127/74, pulse 75, resp. rate 14, weight 201 lb 12.8 oz (91.5 kg), SpO2 100 %.  Patient will follow up with [facility] in 12 weeks.

## 2017-07-11 ENCOUNTER — Other Ambulatory Visit: Payer: Self-pay | Admitting: Family Medicine

## 2017-07-11 DIAGNOSIS — M109 Gout, unspecified: Secondary | ICD-10-CM

## 2017-07-12 ENCOUNTER — Emergency Department (HOSPITAL_COMMUNITY): Payer: Medicare Other

## 2017-07-12 ENCOUNTER — Inpatient Hospital Stay (HOSPITAL_COMMUNITY)
Admission: EM | Admit: 2017-07-12 | Discharge: 2017-07-14 | DRG: 291 | Disposition: A | Discharge status: Home / Self Care | Payer: Medicare Other | Attending: Internal Medicine | Admitting: Internal Medicine

## 2017-07-12 ENCOUNTER — Other Ambulatory Visit: Payer: Self-pay

## 2017-07-12 ENCOUNTER — Encounter (HOSPITAL_COMMUNITY): Payer: Self-pay | Admitting: Emergency Medicine

## 2017-07-12 DIAGNOSIS — Z9889 Other specified postprocedural states: Secondary | ICD-10-CM | POA: Diagnosis not present

## 2017-07-12 DIAGNOSIS — R0603 Acute respiratory distress: Secondary | ICD-10-CM

## 2017-07-12 DIAGNOSIS — J4489 Other specified chronic obstructive pulmonary disease: Secondary | ICD-10-CM | POA: Diagnosis present

## 2017-07-12 DIAGNOSIS — E1165 Type 2 diabetes mellitus with hyperglycemia: Secondary | ICD-10-CM | POA: Diagnosis present

## 2017-07-12 DIAGNOSIS — M7989 Other specified soft tissue disorders: Secondary | ICD-10-CM

## 2017-07-12 DIAGNOSIS — I2510 Atherosclerotic heart disease of native coronary artery without angina pectoris: Secondary | ICD-10-CM | POA: Diagnosis present

## 2017-07-12 DIAGNOSIS — Z825 Family history of asthma and other chronic lower respiratory diseases: Secondary | ICD-10-CM

## 2017-07-12 DIAGNOSIS — Z8349 Family history of other endocrine, nutritional and metabolic diseases: Secondary | ICD-10-CM

## 2017-07-12 DIAGNOSIS — I110 Hypertensive heart disease with heart failure: Secondary | ICD-10-CM | POA: Diagnosis not present

## 2017-07-12 DIAGNOSIS — R609 Edema, unspecified: Secondary | ICD-10-CM

## 2017-07-12 DIAGNOSIS — D649 Anemia, unspecified: Secondary | ICD-10-CM | POA: Diagnosis present

## 2017-07-12 DIAGNOSIS — I160 Hypertensive urgency: Secondary | ICD-10-CM | POA: Diagnosis not present

## 2017-07-12 DIAGNOSIS — I252 Old myocardial infarction: Secondary | ICD-10-CM

## 2017-07-12 DIAGNOSIS — I5023 Acute on chronic systolic (congestive) heart failure: Secondary | ICD-10-CM | POA: Diagnosis not present

## 2017-07-12 DIAGNOSIS — Z888 Allergy status to other drugs, medicaments and biological substances status: Secondary | ICD-10-CM | POA: Diagnosis not present

## 2017-07-12 DIAGNOSIS — N183 Chronic kidney disease, stage 3 unspecified: Secondary | ICD-10-CM | POA: Diagnosis present

## 2017-07-12 DIAGNOSIS — Z7982 Long term (current) use of aspirin: Secondary | ICD-10-CM | POA: Diagnosis not present

## 2017-07-12 DIAGNOSIS — R0602 Shortness of breath: Secondary | ICD-10-CM

## 2017-07-12 DIAGNOSIS — I340 Nonrheumatic mitral (valve) insufficiency: Secondary | ICD-10-CM | POA: Diagnosis not present

## 2017-07-12 DIAGNOSIS — J9601 Acute respiratory failure with hypoxia: Secondary | ICD-10-CM | POA: Diagnosis not present

## 2017-07-12 DIAGNOSIS — I10 Essential (primary) hypertension: Secondary | ICD-10-CM | POA: Diagnosis not present

## 2017-07-12 DIAGNOSIS — Z87442 Personal history of urinary calculi: Secondary | ICD-10-CM | POA: Diagnosis not present

## 2017-07-12 DIAGNOSIS — R0902 Hypoxemia: Secondary | ICD-10-CM

## 2017-07-12 DIAGNOSIS — R600 Localized edema: Secondary | ICD-10-CM

## 2017-07-12 DIAGNOSIS — Z841 Family history of disorders of kidney and ureter: Secondary | ICD-10-CM

## 2017-07-12 DIAGNOSIS — Z833 Family history of diabetes mellitus: Secondary | ICD-10-CM

## 2017-07-12 DIAGNOSIS — Z8249 Family history of ischemic heart disease and other diseases of the circulatory system: Secondary | ICD-10-CM

## 2017-07-12 DIAGNOSIS — Z87891 Personal history of nicotine dependence: Secondary | ICD-10-CM

## 2017-07-12 DIAGNOSIS — E1122 Type 2 diabetes mellitus with diabetic chronic kidney disease: Secondary | ICD-10-CM | POA: Diagnosis present

## 2017-07-12 DIAGNOSIS — J449 Chronic obstructive pulmonary disease, unspecified: Secondary | ICD-10-CM | POA: Diagnosis present

## 2017-07-12 DIAGNOSIS — Z955 Presence of coronary angioplasty implant and graft: Secondary | ICD-10-CM | POA: Diagnosis not present

## 2017-07-12 DIAGNOSIS — Z951 Presence of aortocoronary bypass graft: Secondary | ICD-10-CM | POA: Diagnosis not present

## 2017-07-12 DIAGNOSIS — I509 Heart failure, unspecified: Secondary | ICD-10-CM | POA: Diagnosis not present

## 2017-07-12 DIAGNOSIS — IMO0002 Reserved for concepts with insufficient information to code with codable children: Secondary | ICD-10-CM | POA: Diagnosis present

## 2017-07-12 DIAGNOSIS — I130 Hypertensive heart and chronic kidney disease with heart failure and stage 1 through stage 4 chronic kidney disease, or unspecified chronic kidney disease: Principal | ICD-10-CM | POA: Diagnosis present

## 2017-07-12 DIAGNOSIS — I248 Other forms of acute ischemic heart disease: Secondary | ICD-10-CM | POA: Diagnosis present

## 2017-07-12 DIAGNOSIS — I447 Left bundle-branch block, unspecified: Secondary | ICD-10-CM | POA: Diagnosis present

## 2017-07-12 DIAGNOSIS — M109 Gout, unspecified: Secondary | ICD-10-CM | POA: Diagnosis present

## 2017-07-12 DIAGNOSIS — Z7984 Long term (current) use of oral hypoglycemic drugs: Secondary | ICD-10-CM

## 2017-07-12 DIAGNOSIS — Z885 Allergy status to narcotic agent status: Secondary | ICD-10-CM | POA: Diagnosis not present

## 2017-07-12 DIAGNOSIS — I25119 Atherosclerotic heart disease of native coronary artery with unspecified angina pectoris: Secondary | ICD-10-CM | POA: Diagnosis not present

## 2017-07-12 DIAGNOSIS — Z79899 Other long term (current) drug therapy: Secondary | ICD-10-CM

## 2017-07-12 DIAGNOSIS — Z9114 Patient's other noncompliance with medication regimen: Secondary | ICD-10-CM

## 2017-07-12 LAB — BASIC METABOLIC PANEL
ANION GAP: 9 (ref 5–15)
BUN: 24 mg/dL — ABNORMAL HIGH (ref 6–20)
CHLORIDE: 103 mmol/L (ref 101–111)
CO2: 26 mmol/L (ref 22–32)
CREATININE: 1.75 mg/dL — AB (ref 0.61–1.24)
Calcium: 8.8 mg/dL — ABNORMAL LOW (ref 8.9–10.3)
GFR calc Af Amer: 47 mL/min — ABNORMAL LOW (ref >60)
GFR calc non Af Amer: 40 mL/min — ABNORMAL LOW (ref >60)
Glucose, Bld: 127 mg/dL — ABNORMAL HIGH (ref 65–99)
POTASSIUM: 4 mmol/L (ref 3.5–5.1)
SODIUM: 138 mmol/L (ref 135–145)

## 2017-07-12 LAB — CBC
HEMATOCRIT: 37.5 % — AB (ref 39.0–52.0)
Hemoglobin: 12 g/dL — ABNORMAL LOW (ref 13.0–17.0)
MCH: 29.8 pg (ref 26.0–34.0)
MCHC: 32 g/dL (ref 30.0–36.0)
MCV: 93.1 fL (ref 78.0–100.0)
PLATELETS: 258 10*3/uL (ref 150–400)
RBC: 4.03 MIL/uL — ABNORMAL LOW (ref 4.22–5.81)
RDW: 15.6 % — ABNORMAL HIGH (ref 11.5–15.5)
WBC: 7.3 10*3/uL (ref 4.0–10.5)

## 2017-07-12 LAB — I-STAT TROPONIN, ED: Troponin i, poc: 0.03 ng/mL (ref 0.00–0.08)

## 2017-07-12 LAB — BRAIN NATRIURETIC PEPTIDE: B NATRIURETIC PEPTIDE 5: 1152.4 pg/mL — AB (ref 0.0–100.0)

## 2017-07-12 LAB — CBG MONITORING, ED: Glucose-Capillary: 138 mg/dL — ABNORMAL HIGH (ref 65–99)

## 2017-07-12 MED ORDER — ONDANSETRON HCL 4 MG/2ML IJ SOLN: 4.0000 mg | Freq: Four times a day (QID) | INTRAMUSCULAR | Status: DC | PRN

## 2017-07-12 MED ORDER — ATORVASTATIN CALCIUM 80 MG PO TABS
80.0000 mg | ORAL_TABLET | Freq: Every day | ORAL | Status: DC
  Administered 2017-07-13: 80 mg via ORAL
  Filled 2017-07-12: qty 1

## 2017-07-12 MED ORDER — CARVEDILOL 3.125 MG PO TABS
3.1250 mg | ORAL_TABLET | Freq: Two times a day (BID) | ORAL | Status: DC
  Administered 2017-07-13 – 2017-07-14 (×4): 3.125 mg via ORAL
  Filled 2017-07-12 (×6): qty 1

## 2017-07-12 MED ORDER — ALBUTEROL SULFATE (2.5 MG/3ML) 0.083% IN NEBU: 2.5000 mg | INHALATION_SOLUTION | Freq: Four times a day (QID) | RESPIRATORY_TRACT | Status: DC | PRN

## 2017-07-12 MED ORDER — INSULIN ASPART 100 UNIT/ML ~~LOC~~ SOLN: 0.0000 [IU] | Freq: Every day | SUBCUTANEOUS | Status: DC

## 2017-07-12 MED ORDER — ACETAMINOPHEN 325 MG PO TABS: 650.0000 mg | ORAL_TABLET | ORAL | Status: DC | PRN

## 2017-07-12 MED ORDER — NITROGLYCERIN 0.4 MG SL SUBL
0.4000 mg | SUBLINGUAL_TABLET | SUBLINGUAL | Status: DC | PRN
  Administered 2017-07-12: 0.4 mg via SUBLINGUAL
  Filled 2017-07-12: qty 1

## 2017-07-12 MED ORDER — NITROGLYCERIN 0.4 MG SL SUBL
0.4000 mg | SUBLINGUAL_TABLET | SUBLINGUAL | Status: DC | PRN
  Administered 2017-07-12: 0.4 mg via SUBLINGUAL

## 2017-07-12 MED ORDER — SODIUM CHLORIDE 0.9% FLUSH: 3.0000 mL | INTRAVENOUS | Status: DC | PRN

## 2017-07-12 MED ORDER — DOCUSATE SODIUM 100 MG PO CAPS
100.0000 mg | ORAL_CAPSULE | Freq: Two times a day (BID) | ORAL | Status: DC
  Administered 2017-07-13 – 2017-07-14 (×4): 100 mg via ORAL
  Filled 2017-07-12 (×4): qty 1

## 2017-07-12 MED ORDER — DIGOXIN 125 MCG PO TABS
0.1250 mg | ORAL_TABLET | Freq: Every day | ORAL | Status: DC
  Administered 2017-07-13 – 2017-07-14 (×2): 0.125 mg via ORAL
  Filled 2017-07-12 (×2): qty 1

## 2017-07-12 MED ORDER — ALLOPURINOL 300 MG PO TABS
300.0000 mg | ORAL_TABLET | Freq: Two times a day (BID) | ORAL | Status: DC
  Administered 2017-07-13: 300 mg via ORAL
  Filled 2017-07-12: qty 1

## 2017-07-12 MED ORDER — HEPARIN SODIUM (PORCINE) 5000 UNIT/ML IJ SOLN
5000.0000 [IU] | Freq: Three times a day (TID) | INTRAMUSCULAR | Status: DC
  Administered 2017-07-13 – 2017-07-14 (×5): 5000 [IU] via SUBCUTANEOUS
  Filled 2017-07-12 (×5): qty 1

## 2017-07-12 MED ORDER — FUROSEMIDE 10 MG/ML IJ SOLN
40.0000 mg | Freq: Two times a day (BID) | INTRAMUSCULAR | Status: DC
  Administered 2017-07-13: 40 mg via INTRAVENOUS
  Filled 2017-07-12: qty 4

## 2017-07-12 MED ORDER — STUDY - INVESTIGATIONAL MEDICATION: 1.0000 | Freq: Two times a day (BID) | Status: DC

## 2017-07-12 MED ORDER — ASPIRIN EC 81 MG PO TBEC
81.0000 mg | DELAYED_RELEASE_TABLET | Freq: Every day | ORAL | Status: DC
  Administered 2017-07-13 – 2017-07-14 (×2): 81 mg via ORAL
  Filled 2017-07-12 (×2): qty 1

## 2017-07-12 MED ORDER — FUROSEMIDE 10 MG/ML IJ SOLN
40.0000 mg | Freq: Once | INTRAMUSCULAR | Status: AC
  Administered 2017-07-12: 40 mg via INTRAVENOUS
  Filled 2017-07-12: qty 4

## 2017-07-12 MED ORDER — INSULIN ASPART 100 UNIT/ML ~~LOC~~ SOLN
0.0000 [IU] | Freq: Three times a day (TID) | SUBCUTANEOUS | Status: DC
  Administered 2017-07-13 – 2017-07-14 (×2): 2 [IU] via SUBCUTANEOUS

## 2017-07-12 MED ORDER — SODIUM CHLORIDE 0.9% FLUSH
3.0000 mL | Freq: Two times a day (BID) | INTRAVENOUS | Status: DC
  Administered 2017-07-13 (×3): 3 mL via INTRAVENOUS

## 2017-07-12 MED ORDER — SODIUM CHLORIDE 0.9 % IV SOLN: 250.0000 mL | INTRAVENOUS | Status: DC | PRN

## 2017-07-12 MED ORDER — HYDRALAZINE HCL 20 MG/ML IJ SOLN
10.0000 mg | INTRAMUSCULAR | Status: DC | PRN
  Administered 2017-07-12: 10 mg via INTRAVENOUS
  Filled 2017-07-12 (×2): qty 1

## 2017-07-12 NOTE — ED Provider Notes (Signed)
[facility] DEPT Provider Note   CSN: [id number] Arrival date & time: 07/12/17  1401     History   Chief Complaint Chief Complaint  Patient presents with  . Shortness of Breath  . Leg Swelling    HPI Patrick Stewart is a 61 y.o. male with history of CHF coming in today with shortness of breath and leg swelling. Started on Friday and has been worsening. This continued to take Lasix daily as well as all of his prescribed medication however does still been worsening. Get short of breath after walking short distances as well as has paroxysmal nocturnal dyspnea. No chest pain, nausea, vomiting, abdominal pain.  HPI  Past Medical History:  Diagnosis Date  . Angina at rest [facility]) 07/08/2016  . CAD (coronary artery disease) 2010   3v CABG  . Chest pain at rest 07/08/2016  . CHF (congestive heart failure) ([place]) 01/26/2012  . CKD (chronic kidney disease) 02/19/2012  . COPD with chronic bronchitis ([place]) 08/26/2016  . Diabetes mellitus type 2, uncontrolled ([place]) 01/26/2012  . Elevated liver function tests 01/26/2012  . Gout 01/26/2012  . Hx of CABG 2010   RCA Stent 2008, CABG x 2 [facility] 2010  . Hypercholesterolemia 07/08/2016  . Hypertension 09/04/2014  . Ischemic cardiomyopathy 11/15/2014  . Kidney stone 10/25/2015  . Lipid disorder 01/26/2012  . NSTEMI (non-ST elevated myocardial infarction) ([place]) 07/08/2016  . ST elevation myocardial infarction (STEMI) ([place]) 2010  . Unstable angina ([place]) 11/14/2014    Patient Active Problem List   Diagnosis Date Noted  . Hypertensive urgency 07/12/2017  . LBBB (left bundle branch block) 04/26/2017  . AKI (acute kidney injury) ([place])   . ACC/AHA stage C chronic systolic heart failure ([place]) 04/11/2017  . Diabetes mellitus type 2, uncontrolled ([place]) 09/11/2016  . COPD with chronic bronchitis ([place]) 08/26/2016  . Non-ST elevation MI (NSTEMI) ([place]) 07/08/2016  . Kidney stone on right side 10/25/2015  . CKD (chronic kidney disease), stage III  10/13/2015  . Idiopathic gout of left knee 10/13/2015  . Abnormal EKG 10/13/2015  . CAD Status post CABG 2 SVG to OM 1, LIMA to mid LAD: 10/13/2015  . Cardiomyopathy, ischemic, chronic systolic CHF 12/75/1700  . Essential hypertension 09/04/2014  . Benign prostatic hypertrophy with lower urinary tract symptoms (LUTS) 01/24/2013  . Peyronie's disease 12/18/2012  . Chronic joint pain 06/22/2012  . Acute on chronic systolic CHF (congestive heart failure) ([place]) 01/26/2012  . History of tobacco abuse 01/26/2012  . Inguinal hernia, right 01/26/2012  . Lipid disorder 01/26/2012    Past Surgical History:  Procedure Laterality Date  . CORONARY ARTERY BYPASS GRAFT  2010  . KNEE SURGERY    . RIGHT/LEFT HEART CATH AND CORONARY ANGIOGRAPHY N/A 04/15/2017   Procedure: Right/Left Heart Cath and Coronary Angiography;  Surgeon: Larey Dresser, MD;  Location: [facility];  Service: Cardiovascular;  Laterality: N/A;  . ROTATOR CUFF REPAIR    . WRIST SURGERY         Home Medications    Prior to Admission medications   Medication Sig Start Date End Date Taking? Authorizing Provider  albuterol (PROVENTIL HFA;VENTOLIN HFA) 108 (90 Base) MCG/ACT inhaler Inhale 2 puffs into the lungs every 6 (six) hours as needed for wheezing or shortness of breath.   Yes [provider]  albuterol (PROVENTIL) (2.5 MG/3ML) 0.083% nebulizer solution Take 3 mLs (2.5 mg total) by nebulization every 6 (six) hours as needed for wheezing or shortness of breath. 08/26/16  Yes Wendling,  Crosby Oyster, DO  allopurinol (ZYLOPRIM) 300 MG tablet TAKE 1 TABLET BY MOUTH TWICE A DAY 07/12/17  Yes Shelda Pal, DO  aspirin EC 81 MG EC tablet Take 1 tablet (81 mg total) by mouth daily. 04/16/17  Yes Clegg, Amy D, NP  atorvastatin (LIPITOR) 80 MG tablet Take 1 tablet (80 mg total) by mouth daily. 08/26/16  Yes Shelda Pal, DO  carvedilol (COREG) 3.125 MG tablet Take 1 tablet (3.125 mg total) by mouth 2  (two) times daily. 04/26/17  Yes Shirley Friar, PA-C  digoxin (LANOXIN) 0.125 MG tablet Take 1 tablet (0.125 mg total) by mouth daily. 04/16/17  Yes Clegg, Amy D, NP  docusate sodium (COLACE) 100 MG capsule Take 1 capsule (100 mg total) by mouth 2 (two) times daily. 10/02/16  Yes Barton Dubois, MD  Investigational - Study Medication Take 1 tablet by mouth 2 (two) times daily. Study name: Galactic HF Study Additional study details: Omecamtiv Mecarbil or Placebo 04/15/17  Yes Larey Dresser, MD  metFORMIN (GLUCOPHAGE) 500 MG tablet Week 1: 1 tab daily Week 2: 1 tab twice daily Week 3: 2 tabs in AM, 1 in evening Week 4: 2 tabs twice daily Patient taking differently: Take 500 mg by mouth 2 (two) times daily.  01/07/17  Yes Shelda Pal, DO  OXYGEN Inhale into the lungs as needed (shortness of breath).   Yes [provider]  sacubitril-valsartan (ENTRESTO) 24-26 MG Take 1 tablet by mouth 2 (two) times daily. 04/15/17  Yes Clegg, Amy D, NP  spironolactone (ALDACTONE) 25 MG tablet Take 25 mg by mouth daily.   Yes [provider]  torsemide (DEMADEX) 20 MG tablet Take 1 tablet (20 mg total) by mouth daily. 05/17/17  Yes Larey Dresser, MD  colchicine 0.6 MG tablet Take 1 tablet (0.6 mg total) by mouth 2 (two) times daily. Patient not taking: Reported on 07/12/2017 01/07/17   Shelda Pal, DO  famotidine (PEPCID) 20 MG tablet Take 1 tablet (20 mg total) by mouth 2 (two) times daily. Patient not taking: Reported on 07/12/2017 04/29/16   Dorie Rank, MD  glucose blood test strip Use as instructed 08/26/16   Shelda Pal, DO  Lancets (FREESTYLE) lancets Use to check sugars daily 08/26/16   Shelda Pal, DO  polyethylene glycol [facility] / GLYCOLAX) packet Take 17 g by mouth daily. Patient not taking: Reported on 07/12/2017 10/03/16   Barton Dubois, MD    Family History Family History  Problem Relation Age of Onset  . Hypertension Mother   .  Diabetes Mother   . Hyperlipidemia Mother   . Kidney failure Mother   . Emphysema Father     Social History Social History  Substance Use Topics  . Smoking status: Former Smoker    Packs/day: 0.50    Years: 30.00    Quit date: 07/10/2016  . Smokeless tobacco: Never Used  . Alcohol use No     Allergies   Hydrocodone and Tramadol   Review of Systems Review of Systems  Constitutional: Positive for fatigue. Negative for chills and fever.  HENT: Negative for ear pain and sore throat.   Eyes: Negative for pain and visual disturbance.  Respiratory: Positive for cough and shortness of breath. Negative for chest tightness, wheezing and stridor.   Cardiovascular: Positive for leg swelling. Negative for chest pain and palpitations.  Gastrointestinal: Negative for abdominal pain, constipation, diarrhea, nausea and vomiting.  Genitourinary: Negative for dysuria and hematuria.  Musculoskeletal:  Negative for arthralgias and back pain.  Skin: Negative for color change and rash.  Allergic/Immunologic: Negative for immunocompromised state.  Neurological: Negative for seizures and syncope.  Psychiatric/Behavioral: Negative for agitation and behavioral problems.  All other systems reviewed and are negative.    Physical Exam Updated Vital Signs BP (!) 163/113   Pulse 99   Temp 99.1 F (37.3 C) (Oral)   Resp (!) 23   Ht 5\' 10"  (1.778 m)   Wt 91.2 kg (201 lb)   SpO2 94%   BMI 28.84 kg/m   Physical Exam  Constitutional: He is oriented to person, place, and time. He appears well-developed and well-nourished. He appears distressed.  HENT:  Head: Normocephalic and atraumatic.  Mouth/Throat: Oropharynx is clear and moist.  Eyes: Pupils are equal, round, and reactive to light. Conjunctivae and EOM are normal.  Neck: Normal range of motion. Neck supple. No tracheal deviation present.  Cardiovascular: Regular rhythm.   No murmur heard. Mild tachycardia  Pulmonary/Chest: He is in  respiratory distress. He has no wheezes. He has rales. He exhibits no tenderness.  Bilateral coarse breath sounds at the bases. Slightly tachypneic  Abdominal: Soft. He exhibits distension. There is no tenderness. There is no rebound and no guarding.  Musculoskeletal: He exhibits edema. He exhibits no deformity.  Symmetric bilateral lower extremity pitting edema  Neurological: He is alert and oriented to person, place, and time.  Skin: Skin is warm and dry. He is not diaphoretic.  Psychiatric: He has a normal mood and affect.  Nursing note and vitals reviewed.    ED Treatments / Results  Labs (all labs ordered are listed, but only abnormal results are displayed) Labs Reviewed  BASIC METABOLIC PANEL - Abnormal; Notable for the following:       Result Value   Glucose, Bld 127 (*)    BUN 24 (*)    Creatinine, Ser 1.75 (*)    Calcium 8.8 (*)    GFR calc non Af Amer 40 (*)    GFR calc Af Amer 47 (*)    All other components within normal limits  CBC - Abnormal; Notable for the following:    RBC 4.03 (*)    Hemoglobin 12.0 (*)    HCT 37.5 (*)    RDW 15.6 (*)    All other components within normal limits  BRAIN NATRIURETIC PEPTIDE - Abnormal; Notable for the following:    B Natriuretic Peptide 1,152.4 (*)    All other components within normal limits  CBG MONITORING, ED - Abnormal; Notable for the following:    Glucose-Capillary 138 (*)    All other components within normal limits  BASIC METABOLIC PANEL  I-STAT TROPOININ, ED    EKG  EKG Interpretation None       Radiology Dg Chest 2 View  Result Date: 07/12/2017 CLINICAL DATA:  61 y/o M; shortness of breath worsening since Friday. EXAM: CHEST  2 VIEW COMPARISON:  04/11/2017 chest radiograph. FINDINGS: Stable moderate cardiomegaly post CABG. Sternotomy wires are aligned. Interstitial pulmonary edema. Chronic reticular markings of the lung peripheries probably represents mild fibrosis. No acute osseous abnormality is evident.  Blunted right costal diaphragmatic angle. IMPRESSION: 1. Mild interstitial pulmonary edema. 2. Stable moderate cardiomegaly. 3. Blunted right costal diaphragmatic angle, possible small effusion. Electronically Signed   By: Kristine Garbe M.D.   On: 07/12/2017 15:27    Procedures Procedures (including critical care time)  Medications Ordered in ED Medications  allopurinol (ZYLOPRIM) tablet 300 mg (not administered)  carvedilol (COREG) tablet 3.125 mg (not administered)  aspirin EC tablet 81 mg (not administered)  digoxin (LANOXIN) tablet 0.125 mg (not administered)  docusate sodium (COLACE) capsule 100 mg (not administered)  albuterol (PROVENTIL) (2.5 MG/3ML) 0.083% nebulizer solution 2.5 mg (not administered)  atorvastatin (LIPITOR) tablet 80 mg (not administered)  sodium chloride flush (NS) 0.9 % injection 3 mL (not administered)  sodium chloride flush (NS) 0.9 % injection 3 mL (not administered)  0.9 %  sodium chloride infusion (not administered)  acetaminophen (TYLENOL) tablet 650 mg (not administered)  ondansetron (ZOFRAN) injection 4 mg (not administered)  heparin injection 5,000 Units (not administered)  furosemide (LASIX) injection 40 mg (not administered)  insulin aspart (novoLOG) injection 0-9 Units (not administered)  insulin aspart (novoLOG) injection 0-5 Units (0 Units Subcutaneous Not Given 07/12/17 2154)  hydrALAZINE (APRESOLINE) injection 10 mg (10 mg Intravenous Given 07/12/17 2139)  Investigational - Study Medication 1 tablet (not administered)  furosemide (LASIX) injection 40 mg (40 mg Intravenous Given 07/12/17 1839)     Initial Impression / Assessment and Plan / ED Course  I have reviewed the triage vital signs and the nursing notes.  Pertinent labs & imaging results that were available during my care of the patient were reviewed by me and considered in my medical decision making (see chart for details).     Patient coming in with shortness of breath  that started on Friday. Also had some leg swelling. All worsened over the last couple of days. Given 40 of Lasix, sublingual nitroglycerin 2, placed on BiPAP with improvement in blood pressures and shortness of breath. Unable to tolerate being off oxygen and is now at 4 L nasal cannula satting in the mid 90s. He desatted to the mid 66s on room air. Will be admitted for CHF exacerbation. BNP 1,100.  Patient was seen with my attending, Dr. Dayna Barker, who voiced agreement and oversaw the evaluation and treatment of this patient.   [employer] seismologist was used in the creation of this note. If there are any errors or inconsistencies needing clarification, please contact me directly.   Final Clinical Impressions(s) / ED Diagnoses   Final diagnoses:  Leg swelling  Shortness of breath  Bilateral lower extremity edema  Hypoxia  Acute on chronic congestive heart failure, unspecified heart failure type [facility])    New Prescriptions New Prescriptions   No medications on file     Valda Lamb, MD 07/12/17 2230    Merrily Pew, MD 07/13/17 1735

## 2017-07-12 NOTE — ED Notes (Signed)
Arrived to pt room. Pt off biPAP o2 sat 86 percent on room air. Pt placed on 2L n/c. Saturation raised to 91%. o2 increased to 4L n/c o2 saturation increased to 95%

## 2017-07-12 NOTE — ED Triage Notes (Signed)
Pt. Stated, I started Friday with SOB and leg swelling.  I can't hardly breath

## 2017-07-12 NOTE — Telephone Encounter (Signed)
Rx approved and sent to the pharmacy by e-script.//AB/CMA 

## 2017-07-12 NOTE — H&P (Signed)
History and Physical    Aaren Atallah WYO:[id number] DOB: 03-Jan-1956 DOA: 07/12/2017  PCP: Shelda Pal, DO   Patient coming from: Home  Chief Complaint: Leg swelling, SOB  HPI: Jonanthan Bolender is a 61 y.o. male with medical history significant for  CAD status post CABG, hypertension, type 2 diabetes mellitus, chronic kidney disease stage III, and chronic systolic CHF with EF 77-41%, no presenting to the emergency department for evaluation of progressive bilateral leg swelling and shortness of breath. Patient reports that he bit his usual state of health until approximately 3-4 days ago when he noted the insidious development of edema in the bilateral lower extremities and concomitant DOE. Over the ensuing days, his symptoms continued to worsen with his legs becoming increasingly edematous bilaterally and with shortness of breath now occurring while at rest. Patient reports that earlier today, he was having difficulty catching his breath while seated, called his doctor's office for advice, and was directed to the ED for evaluation of this. He denies any recent fevers or chills and denies any chest pain or palpitations. Denies any significant cough. Denies use of illicit substances and denies dietary indiscretions. Of note, he is a participant in a research study with his cardiologist.  ED Course: Upon arrival to the ED, patient is found to be afebrile, saturating 88% on room air, tachypneic to 30, and hypertensive to 160/110. EKG features a sinus rhythm with PACs and a chronic left bundle branch block. Chest x-ray is notable for mild interstitial edema with small right pleural effusion and stable moderate cardiomegaly. Chemistry panel is notable for BUN of 24 and serum creatinine 1.75, consistent with his apparent baseline. CBC features a stable normocytic anemia with hemoglobin of 12.0. Troponin is within normal limits and BNP is elevated 1152. Patient was placed on BiPAP in the ED and treated  with 40 mg of IV Lasix. He has begun to diurese and his respiratory status has improved slightly. Blood pressure is also improved slightly. Patient will be admitted to the stepdown unit for ongoing evaluation and management of acute on chronic systolic CHF with acute hypoxic respiratory failure and hypertensive urgency.  Review of Systems:  All other systems reviewed and apart from HPI, are negative.  Past Medical History:  Diagnosis Date  . Angina at rest [facility]) 07/08/2016  . CAD (coronary artery disease) 2010   3v CABG  . Chest pain at rest 07/08/2016  . CHF (congestive heart failure) ([place]) 01/26/2012  . CKD (chronic kidney disease) 02/19/2012  . COPD with chronic bronchitis ([place]) 08/26/2016  . Diabetes mellitus type 2, uncontrolled ([employer]) 01/26/2012  . Elevated liver function tests 01/26/2012  . Gout 01/26/2012  . Hx of CABG 2010   RCA Stent 2008, CABG x 2 [facility] 2010  . Hypercholesterolemia 07/08/2016  . Hypertension 09/04/2014  . Ischemic cardiomyopathy 11/15/2014  . Kidney stone 10/25/2015  . Lipid disorder 01/26/2012  . NSTEMI (non-ST elevated myocardial infarction) ([place]) 07/08/2016  . ST elevation myocardial infarction (STEMI) ([place]) 2010  . Unstable angina ([place]) 11/14/2014    Past Surgical History:  Procedure Laterality Date  . CORONARY ARTERY BYPASS GRAFT  2010  . KNEE SURGERY    . RIGHT/LEFT HEART CATH AND CORONARY ANGIOGRAPHY N/A 04/15/2017   Procedure: Right/Left Heart Cath and Coronary Angiography;  Surgeon: Larey Dresser, MD;  Location: [facility];  Service: Cardiovascular;  Laterality: N/A;  . ROTATOR CUFF REPAIR    . WRIST SURGERY       reports that  he quit smoking about a year ago. He has a 15.00 pack-year smoking history. He has never used smokeless tobacco. He reports that he does not drink alcohol or use drugs.  Allergies  Allergen Reactions  . Hydrocodone Itching  . Tramadol Nausea Only    Family History  Problem Relation Age of Onset  .  Hypertension Mother   . Diabetes Mother   . Hyperlipidemia Mother   . Kidney failure Mother   . Emphysema Father      Prior to Admission medications   Medication Sig Start Date End Date Taking? Authorizing Provider  albuterol (PROVENTIL HFA;VENTOLIN HFA) 108 (90 Base) MCG/ACT inhaler Inhale 2 puffs into the lungs every 6 (six) hours as needed for wheezing or shortness of breath.   Yes [provider]  albuterol (PROVENTIL) (2.5 MG/3ML) 0.083% nebulizer solution Take 3 mLs (2.5 mg total) by nebulization every 6 (six) hours as needed for wheezing or shortness of breath. 08/26/16  Yes Shelda Pal, DO  allopurinol (ZYLOPRIM) 300 MG tablet TAKE 1 TABLET BY MOUTH TWICE A DAY 07/12/17  Yes Shelda Pal, DO  aspirin EC 81 MG EC tablet Take 1 tablet (81 mg total) by mouth daily. 04/16/17  Yes Clegg, Amy D, NP  atorvastatin (LIPITOR) 80 MG tablet Take 1 tablet (80 mg total) by mouth daily. 08/26/16  Yes Shelda Pal, DO  carvedilol (COREG) 3.125 MG tablet Take 1 tablet (3.125 mg total) by mouth 2 (two) times daily. 04/26/17  Yes Shirley Friar, PA-C  digoxin (LANOXIN) 0.125 MG tablet Take 1 tablet (0.125 mg total) by mouth daily. 04/16/17  Yes Clegg, Amy D, NP  docusate sodium (COLACE) 100 MG capsule Take 1 capsule (100 mg total) by mouth 2 (two) times daily. 10/02/16  Yes Barton Dubois, MD  Investigational - Study Medication Take 1 tablet by mouth 2 (two) times daily. Study name: Galactic HF Study Additional study details: Omecamtiv Mecarbil or Placebo 04/15/17  Yes Larey Dresser, MD  metFORMIN (GLUCOPHAGE) 500 MG tablet Week 1: 1 tab daily Week 2: 1 tab twice daily Week 3: 2 tabs in AM, 1 in evening Week 4: 2 tabs twice daily Patient taking differently: Take 500 mg by mouth 2 (two) times daily.  01/07/17  Yes Shelda Pal, DO  OXYGEN Inhale into the lungs as needed (shortness of breath).   Yes [provider]  sacubitril-valsartan  (ENTRESTO) 24-26 MG Take 1 tablet by mouth 2 (two) times daily. 04/15/17  Yes Clegg, Amy D, NP  spironolactone (ALDACTONE) 25 MG tablet Take 25 mg by mouth daily.   Yes [provider]  torsemide (DEMADEX) 20 MG tablet Take 1 tablet (20 mg total) by mouth daily. 05/17/17  Yes Larey Dresser, MD  colchicine 0.6 MG tablet Take 1 tablet (0.6 mg total) by mouth 2 (two) times daily. Patient not taking: Reported on 07/12/2017 01/07/17   Shelda Pal, DO  famotidine (PEPCID) 20 MG tablet Take 1 tablet (20 mg total) by mouth 2 (two) times daily. Patient not taking: Reported on 07/12/2017 04/29/16   Dorie Rank, MD  glucose blood test strip Use as instructed 08/26/16   Shelda Pal, DO  Lancets (FREESTYLE) lancets Use to check sugars daily 08/26/16   Shelda Pal, DO  polyethylene glycol [facility] / GLYCOLAX) packet Take 17 g by mouth daily. Patient not taking: Reported on 07/12/2017 10/03/16   Barton Dubois, MD    Physical Exam: Vitals:   07/12/17 1941 07/12/17  1945 07/12/17 2000 07/12/17 2015  BP:  (!) 175/123 (!) 179/116 (!) 173/114  Pulse: 89 98 98 87  Resp: (!) 22 (!) 27 (!) 26 (!) 25  Temp:      TempSrc:      SpO2: 97% 91% (!) 88% 96%  Weight:      Height:          Constitutional: In respiratory distress with tachypnea and accessory muscle recruitment. Appears lethargic. No pallor, no diaphoresis.  Eyes: PERTLA, lids and conjunctivae normal ENMT: Mucous membranes are moist. Posterior pharynx clear of any exudate or lesions.   Neck: normal, supple, no masses, no thyromegaly Respiratory: Diffuse rales. Tachypnea. Accessory muscle recruitment. No pallor or cyanosis.   Cardiovascular: S1 & S2 heard, regular rate and rhythm. Bilateral LE edema to knees. Marked JVD. Abdomen: No distension, no tenderness, no masses palpated. Bowel sounds normal.  Musculoskeletal: no clubbing / cyanosis. No joint deformity upper and lower extremities.   Skin: no significant  rashes, lesions, ulcers. Cool. Dry. Neurologic: CN 2-12 grossly intact. Sensation intact, DTR normal. Strength 5/5 in all 4 limbs.  Psychiatric: Alert and oriented x 3. Pleasant and cooperative.     Labs on Admission: I have personally reviewed following labs and imaging studies  CBC:  Recent Labs Lab 07/12/17 1400  WBC 7.3  HGB 12.0*  HCT 37.5*  MCV 93.1  PLT 037   Basic Metabolic Panel:  Recent Labs Lab 07/12/17 1400  NA 138  K 4.0  CL 103  CO2 26  GLUCOSE 127*  BUN 24*  CREATININE 1.75*  CALCIUM 8.8*   GFR: Estimated Creatinine Clearance: 50.3 mL/min (A) (by C-G formula based on SCr of 1.75 mg/dL (H)). Liver Function Tests: No results for input(s): AST, ALT, ALKPHOS, BILITOT, PROT, ALBUMIN in the last 168 hours. No results for input(s): LIPASE, AMYLASE in the last 168 hours. No results for input(s): AMMONIA in the last 168 hours. Coagulation Profile: No results for input(s): INR, PROTIME in the last 168 hours. Cardiac Enzymes: No results for input(s): CKTOTAL, CKMB, CKMBINDEX, TROPONINI in the last 168 hours. BNP (last 3 results) No results for input(s): PROBNP in the last 8760 hours. HbA1C: No results for input(s): HGBA1C in the last 72 hours. CBG: No results for input(s): GLUCAP in the last 168 hours. Lipid Profile: No results for input(s): CHOL, HDL, LDLCALC, TRIG, CHOLHDL, LDLDIRECT in the last 72 hours. Thyroid Function Tests: No results for input(s): TSH, T4TOTAL, FREET4, T3FREE, THYROIDAB in the last 72 hours. Anemia Panel: No results for input(s): VITAMINB12, FOLATE, FERRITIN, TIBC, IRON, RETICCTPCT in the last 72 hours. Urine analysis:    Component Value Date/Time   COLORURINE AMBER (A) 09/25/2016 1352   APPEARANCEUR CLOUDY (A) 09/25/2016 1352   LABSPEC 1.027 09/25/2016 1352   PHURINE 5.5 09/25/2016 1352   GLUCOSEU NEGATIVE 09/25/2016 1352   HGBUR LARGE (A) 09/25/2016 1352   BILIRUBINUR SMALL (A) 09/25/2016 1352   KETONESUR 15 (A)  09/25/2016 1352   PROTEINUR >300 (A) 09/25/2016 1352   UROBILINOGEN 1.0 10/12/2015 1830   NITRITE NEGATIVE 09/25/2016 1352   LEUKOCYTESUR NEGATIVE 09/25/2016 1352   Sepsis Labs: @LABRCNTIP (procalcitonin:4,lacticidven:4) )No results found for this or any previous visit (from the past 240 hour(s)).   Radiological Exams on Admission: Dg Chest 2 View  Result Date: 07/12/2017 CLINICAL DATA:  61 y/o M; shortness of breath worsening since Friday. EXAM: CHEST  2 VIEW COMPARISON:  04/11/2017 chest radiograph. FINDINGS: Stable moderate cardiomegaly post CABG. Sternotomy wires are aligned. Interstitial  pulmonary edema. Chronic reticular markings of the lung peripheries probably represents mild fibrosis. No acute osseous abnormality is evident. Blunted right costal diaphragmatic angle. IMPRESSION: 1. Mild interstitial pulmonary edema. 2. Stable moderate cardiomegaly. 3. Blunted right costal diaphragmatic angle, possible small effusion. Electronically Signed   By: Kristine Garbe M.D.   On: 07/12/2017 15:27    EKG: Independently reviewed. Sinus rhythm, PAC's, chronic LBBB.  Assessment/Plan  1. Acute on chronic systolic CHF, acute hypoxic respiratory failure  - Pt presents with several days of progressive leg edema and SOB, found to be acute on chronic CHF with marked peripheral edema, JVD, diffuse rales, and acute respiratory distress with hypoxia  - TTE (04/12/17) with EF 10-15%, severely dilated LV, mild LVH, diffuse HK, moderate AR, moderate LAE, and moderate elevation in PA pressures  - Managed at home with torsemide 20 mg qD, Aldactone, Entresto, Coreg, digoxin, and a research study medication or placebo  - He was given 40 mg IV Lasix in ED and started on BiPAP with some subjective improvement  - Plan to SLIV, follow daily wts and strict I/O's, continue diuresis with IV Lasix 40 mg q12h, BiPAP prn, updated echo, continue Coreg and digoxin, hold Entresto while aggressively diuresing  -  Discussed admission with cardiologist regarding research study; advised to continue the study medication   2. Hypertension with hypertensive urgency  - BP elevated to 160/110 in ED in setting of acute CHF  - Diurese as above, continue Coreg, add prn hydralazine IVP's    3. CKD stage III  - SCr is stable on admission at 1.75  - He needs aggressive diuresis; will hold Entresto initially to avoid precipitating AKI  - Follow daily chem panel during diuresis    4. COPD  - No wheezing appreciated  - Continue nebs   5. Type II DM  - A1c was 7.8% in April 2018  - Managed with metformin only at home, will hold  - Check CBG's with meals and qHS  - Start a low-intensity Novolog correctional    6. CAD  - No anginal complaints, troponin wnl  - Continue ASA, Lipitor, Coreg    DVT prophylaxis: sq heparin  Code Status: Full  Family Communication: Discussed with patient Disposition Plan: Admit to SDU Consults called: None Admission status: Inpatient   Vianne Bulls, MD [facility] Pager [phone]  If 7PM-7AM, please contact night-coverage [email or web address] Password [facility]  07/12/2017, 9:34 PM

## 2017-07-12 NOTE — ED Notes (Signed)
Per Dr. Myna Hidalgo OK to use bipap PRN. After sandwich eaten Pt requesting bipap at this time and applied.

## 2017-07-13 ENCOUNTER — Encounter (HOSPITAL_COMMUNITY): Payer: Self-pay

## 2017-07-13 ENCOUNTER — Inpatient Hospital Stay (HOSPITAL_COMMUNITY): Payer: Medicare Other

## 2017-07-13 DIAGNOSIS — E1165 Type 2 diabetes mellitus with hyperglycemia: Secondary | ICD-10-CM

## 2017-07-13 DIAGNOSIS — N183 Chronic kidney disease, stage 3 (moderate): Secondary | ICD-10-CM

## 2017-07-13 DIAGNOSIS — I25119 Atherosclerotic heart disease of native coronary artery with unspecified angina pectoris: Secondary | ICD-10-CM

## 2017-07-13 DIAGNOSIS — I160 Hypertensive urgency: Secondary | ICD-10-CM

## 2017-07-13 DIAGNOSIS — I340 Nonrheumatic mitral (valve) insufficiency: Secondary | ICD-10-CM

## 2017-07-13 DIAGNOSIS — E1122 Type 2 diabetes mellitus with diabetic chronic kidney disease: Secondary | ICD-10-CM

## 2017-07-13 DIAGNOSIS — I5023 Acute on chronic systolic (congestive) heart failure: Secondary | ICD-10-CM

## 2017-07-13 DIAGNOSIS — J449 Chronic obstructive pulmonary disease, unspecified: Secondary | ICD-10-CM

## 2017-07-13 DIAGNOSIS — J9601 Acute respiratory failure with hypoxia: Secondary | ICD-10-CM

## 2017-07-13 LAB — ECHOCARDIOGRAM COMPLETE
AVLVOTPG: 8 mmHg
AVPHT: 312 ms
Area-P 1/2: 1.35 cm2
CHL CUP MV DEC (S): 127
CHL CUP TV REG PEAK VELOCITY: 364 cm/s
E/e' ratio: 14.4
EWDT: 127 ms
FS: 8 % — AB (ref 28–44)
HEIGHTINCHES: 70 in
IVS/LV PW RATIO, ED: 0.92
LA ID, A-P, ES: 41 mm
LA diam end sys: 41 mm
LA diam index: 1.88 cm/m2
LA vol A4C: 77.1 ml
LA vol index: 45.8 mL/m2
LAVOL: 100 mL
LV E/e'average: 14.4
LV dias vol index: 116 mL/m2
LV dias vol: 253 mL — AB (ref 62–150)
LV e' LATERAL: 6.64 cm/s
LV sys vol index: 93 mL/m2
LVEEMED: 14.4
LVOT VTI: 22.4 cm
LVOT area: 3.14 cm2
LVOT diameter: 20 mm
LVOT peak vel: 140 cm/s
LVOTSV: 70 mL
LVSYSVOL: 203 mL — AB
Lateral S' vel: 6.7 cm/s
MV Peak grad: 4 mmHg
MV pk E vel: 95.6 m/s
MVPKAVEL: 27.8 m/s
MVSPHT: 37 ms
PW: 13 mm — AB (ref 0.6–1.1)
RV sys press: 61 mmHg
Simpson's disk: 20
Stroke v: 50 ml
TAPSE: 11.5 mm
TDI e' lateral: 6.64
TDI e' medial: 2.25
TR max vel: 364 cm/s
VTI: 132 cm
WEIGHTICAEL: 3329.6 [oz_av]

## 2017-07-13 LAB — BASIC METABOLIC PANEL
Anion gap: 9 (ref 5–15)
BUN: 24 mg/dL — ABNORMAL HIGH (ref 6–20)
CHLORIDE: 104 mmol/L (ref 101–111)
CO2: 26 mmol/L (ref 22–32)
CREATININE: 1.52 mg/dL — AB (ref 0.61–1.24)
Calcium: 8.6 mg/dL — ABNORMAL LOW (ref 8.9–10.3)
GFR calc non Af Amer: 48 mL/min — ABNORMAL LOW (ref >60)
GFR, EST AFRICAN AMERICAN: 55 mL/min — AB (ref >60)
Glucose, Bld: 135 mg/dL — ABNORMAL HIGH (ref 65–99)
POTASSIUM: 3.6 mmol/L (ref 3.5–5.1)
SODIUM: 139 mmol/L (ref 135–145)

## 2017-07-13 LAB — GLUCOSE, CAPILLARY
GLUCOSE-CAPILLARY: 198 mg/dL — AB (ref 65–99)
GLUCOSE-CAPILLARY: 114 mg/dL — AB (ref 65–99)
GLUCOSE-CAPILLARY: 140 mg/dL — AB (ref 65–99)

## 2017-07-13 LAB — TROPONIN I
TROPONIN I: 0.16 ng/mL — AB (ref <0.03)
Troponin I: 0.18 ng/mL (ref <0.03)
Troponin I: 0.17 ng/mL (ref <0.03)

## 2017-07-13 LAB — MRSA PCR SCREENING: MRSA by PCR: POSITIVE — AB

## 2017-07-13 MED ORDER — TIOTROPIUM BROMIDE MONOHYDRATE 18 MCG IN CAPS
18.0000 ug | ORAL_CAPSULE | Freq: Every day | RESPIRATORY_TRACT | Status: DC
  Filled 2017-07-13 (×2): qty 5

## 2017-07-13 MED ORDER — FUROSEMIDE 10 MG/ML IJ SOLN
40.0000 mg | Freq: Once | INTRAMUSCULAR | Status: AC
  Administered 2017-07-13: 40 mg via INTRAVENOUS
  Filled 2017-07-13: qty 4

## 2017-07-13 MED ORDER — CHLORHEXIDINE GLUCONATE CLOTH 2 % EX PADS
6.0000 | MEDICATED_PAD | Freq: Every day | CUTANEOUS | Status: DC
  Administered 2017-07-13 – 2017-07-14 (×2): 6 via TOPICAL

## 2017-07-13 MED ORDER — COLCHICINE 0.6 MG PO TABS
0.6000 mg | ORAL_TABLET | Freq: Once | ORAL | Status: AC
  Administered 2017-07-13: 0.6 mg via ORAL
  Filled 2017-07-13: qty 1

## 2017-07-13 MED ORDER — MOMETASONE FURO-FORMOTEROL FUM 200-5 MCG/ACT IN AERO
2.0000 | INHALATION_SPRAY | Freq: Two times a day (BID) | RESPIRATORY_TRACT | Status: DC
Start: 2017-07-13 — End: 2017-07-13
  Filled 2017-07-13 (×3): qty 8.8

## 2017-07-13 MED ORDER — MOMETASONE FURO-FORMOTEROL FUM 200-5 MCG/ACT IN AERO
2.0000 | INHALATION_SPRAY | Freq: Two times a day (BID) | RESPIRATORY_TRACT | Status: DC
  Administered 2017-07-13: 2 via RESPIRATORY_TRACT
  Filled 2017-07-13 (×2): qty 8.8

## 2017-07-13 MED ORDER — POTASSIUM CHLORIDE CRYS ER 20 MEQ PO TBCR
20.0000 meq | EXTENDED_RELEASE_TABLET | Freq: Once | ORAL | Status: AC
  Administered 2017-07-13: 20 meq via ORAL
  Filled 2017-07-13: qty 1

## 2017-07-13 MED ORDER — COLCHICINE 0.6 MG PO TABS
0.6000 mg | ORAL_TABLET | Freq: Every day | ORAL | Status: DC
  Administered 2017-07-14: 0.6 mg via ORAL
  Filled 2017-07-13: qty 1

## 2017-07-13 MED ORDER — MUPIROCIN 2 % EX OINT
1.0000 "application " | TOPICAL_OINTMENT | Freq: Two times a day (BID) | CUTANEOUS | Status: DC
  Administered 2017-07-13 – 2017-07-14 (×3): 1 via NASAL
  Filled 2017-07-13 (×2): qty 22

## 2017-07-13 MED ORDER — SACUBITRIL-VALSARTAN 24-26 MG PO TABS
1.0000 | ORAL_TABLET | Freq: Two times a day (BID) | ORAL | Status: DC
  Administered 2017-07-13 – 2017-07-14 (×3): 1 via ORAL
  Filled 2017-07-13 (×4): qty 1

## 2017-07-13 MED ORDER — FUROSEMIDE 10 MG/ML IJ SOLN
80.0000 mg | Freq: Two times a day (BID) | INTRAMUSCULAR | Status: DC
  Administered 2017-07-13 – 2017-07-14 (×2): 80 mg via INTRAVENOUS
  Filled 2017-07-13 (×2): qty 8

## 2017-07-13 MED ORDER — SPIRONOLACTONE 25 MG PO TABS
12.5000 mg | ORAL_TABLET | Freq: Every day | ORAL | Status: DC
  Administered 2017-07-13 – 2017-07-14 (×2): 12.5 mg via ORAL
  Filled 2017-07-13 (×2): qty 1

## 2017-07-13 NOTE — Consult Note (Signed)
Advanced Heart Failure Team Consult Note  Primary Cardiologist:  Dr. Aundra Dubin   Reason for Consultation: Acute on chronic systolic CHF  HPI:    Patrick Stewart is seen today for evaluation of acute on chronic systolic CHF at the request of Dr. Grandville Silos.   Patrick Stewart is a 61 y.o. male with history of ICM, CAD s/p CABG x 2 (SVG -> OM1, LIMA -> LAD) in 2010, COPD, DMII, Gout, tobacco abuse, and HTN.   Of note, patient is on Galactic-HF study med but did not bring to hospital and has noone who can go get it.   Pt last seen in HF clinic 04/26/17 (Seen in HF Pharmacy clinic 05/17/17 but no changes made with questions concerning compliance with meds and regimen). Torsemide previously decreased post hospitalization with dizziness. Was thought to be mildly volume overloaded at that visit and told to take extra torsemide. Weight at that visit 203 lbs.   Pt presented to [facility] 07/12/17 with worsening SOB and HTN. CXR with mild interstitial edema and small R pleural effusion. Pertinent labs include Cr 1.75, BUN 24, BNP 1152, Hgb 12.0, and troponin normal. Required BiPAP in ED with sats in 80s. Improved with BiPAP and IV lasix. Admitted to stepdown unit. Entresto and spiro held with "concerns for precipitating AKI" in setting of diuresis.   Negative 1.2 L so far this admission, though no input recorded. Weight up 7 lbs from ED to floor scale.   States late last week started feeling. More SOB. No fever or chills. Did not take any extra torsemide. Doesn't feel like he has been drinking "too much" water, but can't name a certain quantity. Eating watermelon. Trying to watch salt.  States he was taking all medication as directed. Feeling better today, but weight still up and legs still swollen. Mild orthopnea.   Review of Systems: [y] = yes, [ ]  = no   General: Weight gain [y]; Weight loss [ ] ; Anorexia [ ] ; Fatigue [ ] ; Fever [ ] ; Chills [ ] ; Weakness [ ]   Cardiac: Chest pain/pressure [ ] ; Resting SOB [ ] ;  Exertional SOB [y]; Orthopnea [y]; Pedal Edema [y]; Palpitations [ ] ; Syncope [ ] ; Presyncope [ ] ; Paroxysmal nocturnal dyspnea[ ]   Pulmonary: Cough [y]; Wheezing[ ] ; Hemoptysis[ ] ; Sputum [ ] ; Snoring [ ]   GI: Vomiting[ ] ; Dysphagia[ ] ; Melena[ ] ; Hematochezia [ ] ; Heartburn[ ] ; Abdominal pain [ ] ; Constipation [ ] ; Diarrhea [ ] ; BRBPR [ ]   GU: Hematuria[ ] ; Dysuria [ ] ; Nocturia[ ]   Vascular: Pain in legs with walking [ ] ; Pain in feet with lying flat [ ] ; Non-healing sores [ ] ; Stroke [ ] ; TIA [ ] ; Slurred speech [ ] ;  Neuro: Headaches[ ] ; Vertigo[ ] ; Seizures[ ] ; Paresthesias[ ] ;Blurred vision [ ] ; Diplopia [ ] ; Vision changes [ ]   Ortho/Skin: Arthritis [y]; Joint pain [y]; Muscle pain [ ] ; Joint swelling [ ] ; Back Pain [ ] ; Rash [ ]   Psych: Depression[ ] ; Anxiety[ ]   Heme: Bleeding problems [ ] ; Clotting disorders [ ] ; Anemia [ ]   Endocrine: Diabetes [ ] ; Thyroid dysfunction[ ]   Home Medications Prior to Admission medications   Medication Sig Start Date End Date Taking? Authorizing Provider  albuterol (PROVENTIL HFA;VENTOLIN HFA) 108 (90 Base) MCG/ACT inhaler Inhale 2 puffs into the lungs every 6 (six) hours as needed for wheezing or shortness of breath.   Yes [provider]  albuterol (PROVENTIL) (2.5 MG/3ML) 0.083% nebulizer solution Take 3 mLs (2.5 mg total) by nebulization  every 6 (six) hours as needed for wheezing or shortness of breath. 08/26/16  Yes Shelda Pal, DO  allopurinol (ZYLOPRIM) 300 MG tablet TAKE 1 TABLET BY MOUTH TWICE A DAY 07/12/17  Yes Shelda Pal, DO  aspirin EC 81 MG EC tablet Take 1 tablet (81 mg total) by mouth daily. 04/16/17  Yes Clegg, Amy D, NP  atorvastatin (LIPITOR) 80 MG tablet Take 1 tablet (80 mg total) by mouth daily. 08/26/16  Yes Shelda Pal, DO  carvedilol (COREG) 3.125 MG tablet Take 1 tablet (3.125 mg total) by mouth 2 (two) times daily. 04/26/17  Yes Shirley Friar, PA-C  digoxin (LANOXIN) 0.125 MG  tablet Take 1 tablet (0.125 mg total) by mouth daily. 04/16/17  Yes Clegg, Amy D, NP  docusate sodium (COLACE) 100 MG capsule Take 1 capsule (100 mg total) by mouth 2 (two) times daily. 10/02/16  Yes Barton Dubois, MD  Investigational - Study Medication Take 1 tablet by mouth 2 (two) times daily. Study name: Galactic HF Study Additional study details: Omecamtiv Mecarbil or Placebo 04/15/17  Yes Larey Dresser, MD  metFORMIN (GLUCOPHAGE) 500 MG tablet Week 1: 1 tab daily Week 2: 1 tab twice daily Week 3: 2 tabs in AM, 1 in evening Week 4: 2 tabs twice daily Patient taking differently: Take 500 mg by mouth 2 (two) times daily.  01/07/17  Yes Shelda Pal, DO  OXYGEN Inhale into the lungs as needed (shortness of breath).   Yes [provider]  sacubitril-valsartan (ENTRESTO) 24-26 MG Take 1 tablet by mouth 2 (two) times daily. 04/15/17  Yes Clegg, Amy D, NP  spironolactone (ALDACTONE) 25 MG tablet Take 25 mg by mouth daily.   Yes [provider]  torsemide (DEMADEX) 20 MG tablet Take 1 tablet (20 mg total) by mouth daily. 05/17/17  Yes Larey Dresser, MD  colchicine 0.6 MG tablet Take 1 tablet (0.6 mg total) by mouth 2 (two) times daily. Patient not taking: Reported on 07/12/2017 01/07/17   Shelda Pal, DO  famotidine (PEPCID) 20 MG tablet Take 1 tablet (20 mg total) by mouth 2 (two) times daily. Patient not taking: Reported on 07/12/2017 04/29/16   Dorie Rank, MD  glucose blood test strip Use as instructed 08/26/16   Shelda Pal, DO  Lancets (FREESTYLE) lancets Use to check sugars daily 08/26/16   Shelda Pal, DO  polyethylene glycol [facility] / GLYCOLAX) packet Take 17 g by mouth daily. Patient not taking: Reported on 07/12/2017 10/03/16   Barton Dubois, MD    Past Medical History: Past Medical History:  Diagnosis Date  . Angina at rest [facility]) 07/08/2016  . CAD (coronary artery disease) 2010   3v CABG  . Chest pain at rest 07/08/2016  .  CHF (congestive heart failure) ([place]) 01/26/2012  . CKD (chronic kidney disease) 02/19/2012  . COPD with chronic bronchitis ([place]) 08/26/2016  . Diabetes mellitus type 2, uncontrolled ([place]) 01/26/2012  . Elevated liver function tests 01/26/2012  . Gout 01/26/2012  . Hx of CABG 2010   RCA Stent 2008, CABG x 2 [facility] 2010  . Hypercholesterolemia 07/08/2016  . Hypertension 09/04/2014  . Ischemic cardiomyopathy 11/15/2014  . Kidney stone 10/25/2015  . Lipid disorder 01/26/2012  . NSTEMI (non-ST elevated myocardial infarction) ([place]) 07/08/2016  . ST elevation myocardial infarction (STEMI) ([place]) 2010  . Unstable angina ([place]) 11/14/2014    Past Surgical History: Past Surgical History:  Procedure Laterality Date  . CORONARY ARTERY BYPASS GRAFT  2010  . KNEE SURGERY    . RIGHT/LEFT HEART CATH AND CORONARY ANGIOGRAPHY N/A 04/15/2017   Procedure: Right/Left Heart Cath and Coronary Angiography;  Surgeon: Larey Dresser, MD;  Location: [place] CV LAB;  Service: Cardiovascular;  Laterality: N/A;  . ROTATOR CUFF REPAIR    . WRIST SURGERY      Family History: Family History  Problem Relation Age of Onset  . Hypertension Mother   . Diabetes Mother   . Hyperlipidemia Mother   . Kidney failure Mother   . Emphysema Father     Social History: Social History   Social History  . Marital status: Married    Spouse name: N/A  . Number of children: N/A  . Years of education: N/A   Occupational History  . disabled    Social History Main Topics  . Smoking status: Former Smoker    Packs/day: 0.50    Years: 30.00    Quit date: 07/10/2016  . Smokeless tobacco: Never Used  . Alcohol use No  . Drug use: No  . Sexual activity: Yes   Other Topics Concern  . None   Social History Narrative  . None    Allergies:  Allergies  Allergen Reactions  . Hydrocodone Itching  . Tramadol Nausea Only    Objective:    Vital Signs:   Temp:  [97.9 F (36.6 C)-99.1 F (37.3 C)] 97.9 F (36.6  C) (07/17 0411) Pulse Rate:  [46-104] 78 (07/17 1008) Resp:  [18-32] 18 (07/17 1008) BP: (144-186)/(88-124) 146/98 (07/17 1008) SpO2:  [88 %-100 %] 100 % (07/17 1008) FiO2 (%):  [40 %] 40 % (07/16 1850) Weight:  [201 lb (91.2 kg)-208 lb 1.6 oz (94.4 kg)] 208 lb 1.6 oz (94.4 kg) (07/17 0203) Last BM Date: 07/12/17  Weight change: Filed Weights   07/12/17 1413 07/13/17 0203  Weight: 201 lb (91.2 kg) 208 lb 1.6 oz (94.4 kg)   Intake/Output:   Intake/Output Summary (Last 24 hours) at 07/13/17 1136 Last data filed at 07/13/17 0500  Gross per 24 hour  Intake                0 ml  Output             1200 ml  Net            -1200 ml      Physical Exam    General:  Well appearing. No resp difficulty HEENT: Normal Neck: supple. JVP 9-10, difficulty to fully assess due to facial hair. Carotids 2+ bilat; no bruits. No lymphadenopathy or thyromegaly appreciated. Cor: PMI nondisplaced. Regular rate & rhythm. No rubs, gallops or murmurs. Lungs: clear Abdomen: soft, nontender, nondistended. No hepatosplenomegaly. No bruits or masses. Good bowel sounds. Extremities: no cyanosis, clubbing, rash, edema Neuro: alert & orientedx3, cranial nerves grossly intact. moves all 4 extremities w/o difficulty. Affect pleasant   Telemetry   Personally reviewed, NSR  EKG    07/12/17 Personally reviewed, NSR with PACs. QRS 160s  Labs   Basic Metabolic Panel:  Recent Labs Lab 07/12/17 1400 07/13/17 0336  NA 138 139  K 4.0 3.6  CL 103 104  CO2 26 26  GLUCOSE 127* 135*  BUN 24* 24*  CREATININE 1.75* 1.52*  CALCIUM 8.8* 8.6*    Liver Function Tests: No results for input(s): AST, ALT, ALKPHOS, BILITOT, PROT, ALBUMIN in the last 168 hours. No results for input(s): LIPASE, AMYLASE in the last 168 hours. No results for input(s): AMMONIA in  the last 168 hours.  CBC:  Recent Labs Lab 07/12/17 1400  WBC 7.3  HGB 12.0*  HCT 37.5*  MCV 93.1  PLT 258    Cardiac Enzymes:  Recent  Labs Lab 07/13/17 0814  TROPONINI 0.18*    BNP: BNP (last 3 results)  Recent Labs  04/11/17 0520 05/17/17 1342 07/12/17 1400  BNP 548.9* 315.5* 1,152.4*    ProBNP (last 3 results) No results for input(s): PROBNP in the last 8760 hours.   CBG:  Recent Labs Lab 07/12/17 2152 07/13/17 1123  GLUCAP 138* 198*    Coagulation Studies: No results for input(s): LABPROT, INR in the last 72 hours.   Imaging   Dg Chest 2 View  Result Date: 07/12/2017 CLINICAL DATA:  61 y/o M; shortness of breath worsening since Friday. EXAM: CHEST  2 VIEW COMPARISON:  04/11/2017 chest radiograph. FINDINGS: Stable moderate cardiomegaly post CABG. Sternotomy wires are aligned. Interstitial pulmonary edema. Chronic reticular markings of the lung peripheries probably represents mild fibrosis. No acute osseous abnormality is evident. Blunted right costal diaphragmatic angle. IMPRESSION: 1. Mild interstitial pulmonary edema. 2. Stable moderate cardiomegaly. 3. Blunted right costal diaphragmatic angle, possible small effusion. Electronically Signed   By: Kristine Garbe M.D.   On: 07/12/2017 15:27      Medications:     Current Medications: . allopurinol  300 mg Oral BID  . aspirin EC  81 mg Oral Daily  . atorvastatin  80 mg Oral q1800  . carvedilol  3.125 mg Oral BID WC  . Chlorhexidine Gluconate Cloth  6 each Topical Q0600  . digoxin  0.125 mg Oral Daily  . docusate sodium  100 mg Oral BID  . furosemide  40 mg Intravenous Q12H  . heparin  5,000 Units Subcutaneous Q8H  . insulin aspart  0-5 Units Subcutaneous QHS  . insulin aspart  0-9 Units Subcutaneous TID WC  . Investigational - Study Medication  1 tablet Oral BID  . mometasone-formoterol  2 puff Inhalation BID  . mupirocin ointment  1 application Nasal BID  . sodium chloride flush  3 mL Intravenous Q12H  . tiotropium  18 mcg Inhalation Daily     Infusions: . sodium chloride         Patient Profile   Patrick Stewart  is a 61 y.o. male with history of ICM, CAD s/p CABG x 2 (SVG -> OM1, LIMA -> LAD) in 2010, COPD, DMII, Gout, tobacco abuse, and HTN. Admitted for worsening SOB and hypertensive urgency.   Assessment/Plan    1. Acute on chronic systolic CHF - Echo 2/70/35 LVEF 15%, moderately dilated LV, inferior and inferolateral akinesis, septal-lateral dyssynchrony.  - R/LHC 04/15/17 stable coronary disease and adequate cardiac output.  - Volume status remains elevated. Increase lasix to 80 mg IV BID. Give 40 mg IV now.  - Resume Entresto 24/26 mg BID - Resume spiro 12.5 mg daily. - Have previously discussed CRT-D placement, but pt wished to hold off to optimized meds. Repeat Echo pending. If EF remains low, will consider EP referral (as outpatient)  2. Acute resp failure - Now off Bipap. Much improved.   3. HTN with hypertensive urgency - Resuming HF meds as above.   4. CKD stage II - Follow closely with diuresis  5. CAD s/p CABG x 2 in 2010 at [place] - Stable by cath in 03/2017 despite fall in EF  6. LBBB - Dyssynchrony on ECG. Suspect would benefit from CRT  7. DM2 - Per primary.  Pt  compliance at home questionable at best (Has been confirmed with outside pharmacy at times). Will have HF navigator see and consider for paramedicine on discharge.   Length of Stay: 1   Annamaria Helling  07/13/2017, 11:36 AM  Advanced Heart Failure Team Pager [phone] (M-F; 7a - 4p)  Please contact [facility] for night-coverage after hours (4p -7a ) and weekends on [email or web address]  Patient seen with PA, agree with the above note.    Patient was admitted with acute on chronic systolic CHF, ischemic cardiomyopathy.  Mild increase in troponin is likely demand ischemia from volume overload, no chest pain.  Echo done today with EF persistently low, 20-25% with moderate LV dilation and moderate AI.  He is volume overloaded on exam, likely due to medication noncompliance and also torsemide was cut back not  long ago.  He was hypertensive at presentation.  - Lasix 80 mg IV bid.  - Restart Entresto and spironolactone.  Continue low dose Coreg.  - Creatinine at baseline (1.5 today), has CKD stage 3.  Follow closely with diuresis.  - May start low dose Bidil tomorrow.  - With persistently low EF and LBBB (QRS 168 msec), will likely benefit from CRT-D (will arrange followup with EP).   Gout flare in right elbow => can have colchicine.   Loralie Champagne 07/13/2017 4:57 PM

## 2017-07-13 NOTE — Progress Notes (Signed)
RT placed patient on BIPAP HS. Patient is tolerating well at this time.

## 2017-07-13 NOTE — Progress Notes (Signed)
PROGRESS NOTE    Patrick Stewart  QIH:[id number] DOB: 1956-09-30 DOA: 07/12/2017 PCP: Shelda Pal, DO   Brief Narrative:  Patient is a pleasant 61 year old unfortunate gentleman history of coronary artery disease status post CABG, hypertension, type 2 diabetes, chronic kidney disease stage III, chronic systolic heart failure EF 10-15% presented to the ED with progressive bilateral lower extremity edema, shortness of breath and placed on the BiPAP for an acute on chronic systolic heart failure. Patient initially placed on the BiPAP, IV diuretics. Heart failure team consulted.   Assessment & Plan:   Principal Problem:   Acute on chronic systolic CHF (congestive heart failure) (HCC) Active Problems:   CKD (chronic kidney disease), stage III   CAD Status post CABG 2 SVG to OM 1, LIMA to mid LAD:   Essential hypertension   COPD with chronic bronchitis (HCC)   Diabetes mellitus type 2, uncontrolled (HCC)   LBBB (left bundle branch block)   Hypertensive urgency   Acute respiratory failure with hypoxia ([place])  #1 acute respiratory failure with hypoxia secondary to acute on chronic systolic heart failure/CAD patient presented with a worsening shortness of breath with a past 4-5 days, worsening bilateral lower extremity edema, generalized weakness and noted on exam and chest x-ray to be volume overloaded. BNP elevated at 1152.4. Cardiac enzymes initial troponin level elevated at 0.18. Patient endorses compliance with medication and diet. 2-D echo pending. Patient with a urine output of 1.20 L over night. Current weight is 94.4 kg from 91.2 kg on admission. Doubt if weights accurate at this juncture. Patient with some clinical and currently off BiPAP. Continue Lasix 40 mg IV every 12 hours, Coreg, Lipitor, aspirin, digoxin. Patient was on the research medication which has been resumed per research team. Due to patient's poor EF of 10-15% will consult with the heart failure team for further  evaluation and management.  #2 chronic kidney disease stage III Some improvement with renal function with creatinine currently at 1.5 to from 1.75 on admission. Patient's worsening renal function may be likely due to his poor cardiac output. Entresto on hold. Monitor renal function closely with diuresis.  #3 elevated troponin Likely secondary to volume overload secondary to problem #1 versus acute coronary syndrome. EKG with a chronic left bundle branch block. Continue to cycle cardiac enzymes. Continue aspirin, Lasix, Lipitor, Coreg, digoxin. Consult with the heart failure team.  #4 hypertensive urgency Likely secondary to problem #1. Improving with diuresis. Continue Lasix, Coreg. Follow.  #5 COPD Stable. Place on Spiriva and Symbicort. Nebs as needed.  #6 type 2 diabetes mellitus Hemoglobin A1c 7.8 on 03/2017. Hold oral hypoglycemic agents. Sliding scale insulin.  #7 history of gout Patient states had a recent flare of gout which has improved. Continue allopurinol.     DVT prophylaxis: Heparin Code Status: Full Family Communication: Updated patient. No family at bedside. Disposition Plan: Remain in stepdown unit. Likely transfer to telemetry the next 24-48 hours if continued medical improvement with acute CHF exacerbation.   Consultants:   Heart failure team pending 07/13/2017  Procedures:   2-D echo pending 07/13/2017  Chest x-ray 07/12/2017    Antimicrobials:   None   Subjective: Patient states some improvement with shortness of breath on admission. Patient endorses compliance with his medications and diet. Patient denies any chest pain. Patient currently off BiPAP.  Objective: Vitals:   07/13/17 0203 07/13/17 0335 07/13/17 0411 07/13/17 1008  BP: (!) 144/88  (!) 148/97 (!) 146/98  Pulse: 74 72  78  Resp: 20 (!) 22  18  Temp: 98.6 F (37 C)  97.9 F (36.6 C)   TempSrc: Oral     SpO2:  100%  100%  Weight: 94.4 kg (208 lb 1.6 oz)     Height:         Intake/Output Summary (Last 24 hours) at 07/13/17 1109 Last data filed at 07/13/17 0500  Gross per 24 hour  Intake                0 ml  Output             1200 ml  Net            -1200 ml   Filed Weights   07/12/17 1413 07/13/17 0203  Weight: 91.2 kg (201 lb) 94.4 kg (208 lb 1.6 oz)    Examination:  General exam: Appears calm and comfortable  Respiratory system: Diffuse crackles. Decreased breath sounds in the bases.  Cardiovascular system: S1 & S2 heard, RRR. Positive JVD. 3/6 systolic ejection murmur. 2-3+ bilateral lower extremity edema. Gastrointestinal system: Abdomen is nondistended, soft and nontender. No organomegaly or masses felt. Normal bowel sounds heard. Central nervous system: Alert and oriented. No focal neurological deficits. Extremities: Symmetric 5 x 5 power. Skin: No rashes, lesions or ulcers Psychiatry: Judgement and insight appear normal. Mood & affect appropriate.     Data Reviewed: I have personally reviewed following labs and imaging studies  CBC:  Recent Labs Lab 07/12/17 1400  WBC 7.3  HGB 12.0*  HCT 37.5*  MCV 93.1  PLT 169   Basic Metabolic Panel:  Recent Labs Lab 07/12/17 1400 07/13/17 0336  NA 138 139  K 4.0 3.6  CL 103 104  CO2 26 26  GLUCOSE 127* 135*  BUN 24* 24*  CREATININE 1.75* 1.52*  CALCIUM 8.8* 8.6*   GFR: Estimated Creatinine Clearance: 58.9 mL/min (A) (by C-G formula based on SCr of 1.52 mg/dL (H)). Liver Function Tests: No results for input(s): AST, ALT, ALKPHOS, BILITOT, PROT, ALBUMIN in the last 168 hours. No results for input(s): LIPASE, AMYLASE in the last 168 hours. No results for input(s): AMMONIA in the last 168 hours. Coagulation Profile: No results for input(s): INR, PROTIME in the last 168 hours. Cardiac Enzymes:  Recent Labs Lab 07/13/17 0814  TROPONINI 0.18*   BNP (last 3 results) No results for input(s): PROBNP in the last 8760 hours. HbA1C: No results for input(s): HGBA1C in the last  72 hours. CBG:  Recent Labs Lab 07/12/17 2152  GLUCAP 138*   Lipid Profile: No results for input(s): CHOL, HDL, LDLCALC, TRIG, CHOLHDL, LDLDIRECT in the last 72 hours. Thyroid Function Tests: No results for input(s): TSH, T4TOTAL, FREET4, T3FREE, THYROIDAB in the last 72 hours. Anemia Panel: No results for input(s): VITAMINB12, FOLATE, FERRITIN, TIBC, IRON, RETICCTPCT in the last 72 hours. Sepsis Labs: No results for input(s): PROCALCITON, LATICACIDVEN in the last 168 hours.  Recent Results (from the past 240 hour(s))  MRSA PCR Screening     Status: Abnormal   Collection Time: 07/13/17  2:17 AM  Result Value Ref Range Status   MRSA by PCR POSITIVE (A) NEGATIVE Final    Comment:        The GeneXpert MRSA Assay (FDA approved for NASAL specimens only), is one component of a comprehensive MRSA colonization surveillance program. It is not intended to diagnose MRSA infection nor to guide or monitor treatment for MRSA infections. RESULT CALLED TO, READ BACK BY AND VERIFIED WITH: B  Dannielle Karvonen, RN 07/13/17 0518 L CHAMPION          Radiology Studies: Dg Chest 2 View  Result Date: 07/12/2017 CLINICAL DATA:  61 y/o M; shortness of breath worsening since Friday. EXAM: CHEST  2 VIEW COMPARISON:  04/11/2017 chest radiograph. FINDINGS: Stable moderate cardiomegaly post CABG. Sternotomy wires are aligned. Interstitial pulmonary edema. Chronic reticular markings of the lung peripheries probably represents mild fibrosis. No acute osseous abnormality is evident. Blunted right costal diaphragmatic angle. IMPRESSION: 1. Mild interstitial pulmonary edema. 2. Stable moderate cardiomegaly. 3. Blunted right costal diaphragmatic angle, possible small effusion. Electronically Signed   By: Kristine Garbe M.D.   On: 07/12/2017 15:27        Scheduled Meds: . allopurinol  300 mg Oral BID  . aspirin EC  81 mg Oral Daily  . atorvastatin  80 mg Oral q1800  . carvedilol  3.125 mg Oral BID WC   . Chlorhexidine Gluconate Cloth  6 each Topical Q0600  . digoxin  0.125 mg Oral Daily  . docusate sodium  100 mg Oral BID  . furosemide  40 mg Intravenous Q12H  . heparin  5,000 Units Subcutaneous Q8H  . insulin aspart  0-5 Units Subcutaneous QHS  . insulin aspart  0-9 Units Subcutaneous TID WC  . Investigational - Study Medication  1 tablet Oral BID  . mupirocin ointment  1 application Nasal BID  . sodium chloride flush  3 mL Intravenous Q12H   Continuous Infusions: . sodium chloride       LOS: 1 day    Time spent: 18 mins    Shannon Kirkendall, MD [facility] Pager [phone]  If 7PM-7AM, please contact night-coverage [email or web address] Password [email or web address] 07/13/2017, 11:09 AM

## 2017-07-13 NOTE — Research (Signed)
Subject did not bring Galactic HF investigational product to hospital for this admission.  Discussed importance of bringing IP to the hospital. Subject states that he does not have anyone that can bring the IP from home.  Currently, he is still feeling short of breath and has pitting peripheral edema.  Will continue to follow up with subject.

## 2017-07-13 NOTE — Progress Notes (Signed)
*  PRELIMINARY RESULTS* Echocardiogram 2D Echocardiogram has been performed.  Patrick Stewart 07/13/2017, 11:13 AM

## 2017-07-13 NOTE — Progress Notes (Signed)
Pt arrive to room [facility] @ 0030 alert oriented times 3. BP 161/102 he was transfer from Nasal cannula back to bipap sating @ 99% he denied being in pain. Oriented to surroundings plus call light.

## 2017-07-13 NOTE — Progress Notes (Signed)
RT removed pt from bipap. Pt in no distress talking on the cell phone with bipap on. RT placed pt on 2L [place]. Pt tol well-speaking full sentences not short of breath. Pt tol well. RN notified

## 2017-07-14 ENCOUNTER — Other Ambulatory Visit (HOSPITAL_COMMUNITY): Payer: Self-pay | Admitting: Pharmacist

## 2017-07-14 LAB — CBC
HEMATOCRIT: 36.5 % — AB (ref 39.0–52.0)
HEMOGLOBIN: 11.8 g/dL — AB (ref 13.0–17.0)
MCH: 29.9 pg (ref 26.0–34.0)
MCHC: 32.3 g/dL (ref 30.0–36.0)
MCV: 92.4 fL (ref 78.0–100.0)
Platelets: 295 10*3/uL (ref 150–400)
RBC: 3.95 MIL/uL — AB (ref 4.22–5.81)
RDW: 15.9 % — ABNORMAL HIGH (ref 11.5–15.5)
WBC: 5.9 10*3/uL (ref 4.0–10.5)

## 2017-07-14 LAB — GLUCOSE, CAPILLARY
Glucose-Capillary: 171 mg/dL — ABNORMAL HIGH (ref 65–99)
Glucose-Capillary: 141 mg/dL — ABNORMAL HIGH (ref 65–99)

## 2017-07-14 LAB — MAGNESIUM: MAGNESIUM: 1.6 mg/dL — AB (ref 1.7–2.4)

## 2017-07-14 LAB — BASIC METABOLIC PANEL
Anion gap: 8 (ref 5–15)
BUN: 30 mg/dL — AB (ref 6–20)
CHLORIDE: 99 mmol/L — AB (ref 101–111)
CO2: 29 mmol/L (ref 22–32)
Calcium: 8.7 mg/dL — ABNORMAL LOW (ref 8.9–10.3)
Creatinine, Ser: 1.71 mg/dL — ABNORMAL HIGH (ref 0.61–1.24)
GFR calc Af Amer: 48 mL/min — ABNORMAL LOW (ref >60)
GFR calc non Af Amer: 41 mL/min — ABNORMAL LOW (ref >60)
Glucose, Bld: 157 mg/dL — ABNORMAL HIGH (ref 65–99)
POTASSIUM: 3.5 mmol/L (ref 3.5–5.1)
SODIUM: 136 mmol/L (ref 135–145)

## 2017-07-14 MED ORDER — MAGNESIUM SULFATE 4 GM/100ML IV SOLN
4.0000 g | Freq: Once | INTRAVENOUS | Status: AC
  Administered 2017-07-14: 4 g via INTRAVENOUS
  Filled 2017-07-14 (×2): qty 100

## 2017-07-14 MED ORDER — ISOSORB DINITRATE-HYDRALAZINE 20-37.5 MG PO TABS
0.5000 | ORAL_TABLET | Freq: Three times a day (TID) | ORAL | Status: DC
  Filled 2017-07-14 (×2): qty 0.5

## 2017-07-14 MED ORDER — TORSEMIDE 20 MG PO TABS: 40.0000 mg | ORAL_TABLET | Freq: Every day | ORAL | Status: DC

## 2017-07-14 MED ORDER — TORSEMIDE 20 MG PO TABS: 40.0000 mg | ORAL_TABLET | Freq: Every day | ORAL | 0 refills | Status: DC

## 2017-07-14 MED ORDER — ISOSORB DINITRATE-HYDRALAZINE 20-37.5 MG PO TABS: 0.5000 | ORAL_TABLET | Freq: Three times a day (TID) | ORAL | 0 refills | Status: DC

## 2017-07-14 NOTE — Care Management Note (Addendum)
Case Management Note Marvetta Gibbons RN, BSN Unit 4E-Case Manager [phone]  Patient Details  Name: Patrick Stewart MRN: [id number] Date of Birth: September 27, 1956  Subjective/Objective:   Pt admitted with vol. overload                 Action/Plan: PTA pt lived at home- independent-still drives- per conversation with pt [facility] [address] [facility] and has a RW at home that he uses occasionally - referral received for Bidil needs- per pt he has Part D coverage with Medicare- there is no 30 day free card and copay assist card does not apply to pt with Medicare coverage- [facility]- pt's coverage is with [employer]- benefits check submitted and pt's insurance will not cover Bidil without prior approval for exception- HF team notified-  Pt given medication that was sent up for pt to go home with.    Expected Discharge Date:  07/14/17               Expected Discharge Plan:  Home/Self Care  In-House Referral:     Discharge planning Services  CM Consult, Medication Assistance  Post Acute Care Choice:  NA Choice offered to:  NA  DME Arranged:  N/A DME Agency:  NA  HH Arranged:  NA HH Agency:  NA  Status of Service:  Completed, signed off  If discussed at Hand of Stay Meetings, dates discussed:    Discharge Disposition: home/self care   Additional Comments:  Dawayne Patricia, RN 07/14/2017, 1:19 PM

## 2017-07-14 NOTE — Progress Notes (Addendum)
Heart Failure Navigator Consult Note  Presentation: per Dr Myna Hidalgo: Patrick Stewart is a 61 y.o. male with medical history significant for  CAD status post CABG, hypertension, type 2 diabetes mellitus, chronic kidney disease stage III, and chronic systolic CHF with EF 15-17%, no presenting to the emergency department for evaluation of progressive bilateral leg swelling and shortness of breath. Patient reports that he bit his usual state of health until approximately 3-4 days ago when he noted the insidious development of edema in the bilateral lower extremities and concomitant DOE. Over the ensuing days, his symptoms continued to worsen with his legs becoming increasingly edematous bilaterally and with shortness of breath now occurring while at rest. Patient reports that earlier today, he was having difficulty catching his breath while seated, called his doctor's office for advice, and was directed to the ED for evaluation of this. He denies any recent fevers or chills and denies any chest pain or palpitations. Denies any significant cough. Denies use of illicit substances and denies dietary indiscretions. Of note, he is a participant in a research study with his cardiologist.  Past Medical History:  Diagnosis Date  . Angina at rest [facility]) 07/08/2016  . CAD (coronary artery disease) 2010   3v CABG  . Chest pain at rest 07/08/2016  . CHF (congestive heart failure) ([place]) 01/26/2012  . CKD (chronic kidney disease) 02/19/2012  . COPD with chronic bronchitis ([place]) 08/26/2016  . Diabetes mellitus type 2, uncontrolled ([place]) 01/26/2012  . Elevated liver function tests 01/26/2012  . Gout 01/26/2012  . Hx of CABG 2010   RCA Stent 2008, CABG x 2 [facility] 2010  . Hypercholesterolemia 07/08/2016  . Hypertension 09/04/2014  . Ischemic cardiomyopathy 11/15/2014  . Kidney stone 10/25/2015  . Lipid disorder 01/26/2012  . NSTEMI (non-ST elevated myocardial infarction) ([place]) 07/08/2016  . ST elevation myocardial  infarction (STEMI) ([place]) 2010  . Unstable angina ([place]) 11/14/2014    Social History   Social History  . Marital status: Married    Spouse name: N/A  . Number of children: N/A  . Years of education: N/A   Occupational History  . disabled    Social History Main Topics  . Smoking status: Former Smoker    Packs/day: 0.50    Years: 30.00    Quit date: 07/10/2016  . Smokeless tobacco: Never Used  . Alcohol use No  . Drug use: No  . Sexual activity: Yes   Other Topics Concern  . None   Social History Narrative  . None   Study Conclusions--07/13/17  - Left ventricle: The cavity size was moderately dilated. Wall thickness was increased in a pattern of mild LVH. Systolic function was severely reduced. The estimated ejection fraction was in the range of 20% to 25%. Diffuse hypokinesis. There is akinesis of the inferior myocardium. - Ventricular septum: Septal motion showed abnormal function and dyssynergy. - Aortic valve: There was moderate regurgitation. - Aorta: Aortic root dimension: 39 mm (ED). - Ascending aorta: The ascending aorta was mildly dilated. - Mitral valve: There was mild regurgitation. Valve area by   pressure half-time: 1.35 cm^2. - Left atrium: The atrium was moderately dilated. - Pulmonary arteries: Systolic pressure was moderately to severely   increased. PA peak pressure: 61 mm Hg (S).  ------------------------------------------------------------------- Labs, prior tests, procedures, and surgery: Coronary artery bypass grafting.  ------------------------------------------------------------------- Study data:  Comparison was made to the study of 04/12/2017.  Study status:  Routine.  Procedure:  The patient reported no pain pre or post  test. Transthoracic echocardiography. Image quality was adequate.  Study completion:  There were no complications. Echocardiography.  M-mode, complete 2D, spectral Doppler, and color Doppler.  Birthdate:  Patient  birthdate: 1956-01-11.  Age:  Patient is 61 yr old.  Sex:  Gender: male.    BMI: 29.9 kg/m^2.  Blood pressure:     146/98  Patient status:  Inpatient.  Study date: Study date: 07/13/2017. Study time: 10:27 AM.  Location:  Bedside. ECHO:  BNP    Component Value Date/Time   BNP 1,152.4 (H) 07/12/2017 1400    ProBNP No results found for: PROBNP   Education Assessment and Provision:  Detailed education and instructions provided on heart failure disease management including the following:  Signs and symptoms of Heart Failure When to call the physician Importance of daily weights Low sodium diet Fluid restriction Medication management Anticipated future follow-up appointments  Patient education given on each of the above topics.  Patient acknowledges understanding and acceptance of all instructions.  I spoke with patient regarding his HF and current hospitalization.  He tells me that he has a scale and is weighing daily at home.  I reviewed when to contact the physician related to weights and signs/symptoms of HF.  He admits that he was unaware that the foods he was eating were so high in sodium.  We discussed a low sodium diet and high sodium foods to avoid.  I also briefly discussed fluid restriction- (he very much enjoys watermelon and can eat a half watermelon at a time.)   He denies any issues getting or taking prescribed medications.  He was provided samples of BiDil (new prescription) through the Pharm D in the [facility] before discharge.  He follows in the [facility].  Education Materials:  "Living Better With Heart Failure" Booklet, Daily Weight Tracker Tool    High Risk Criteria for Readmission and/or Poor Patient Outcomes:   EF <30%- 20-25%  2 or more admissions in 6 months- 2/87mo  Difficult social situation- No  Demonstrates medication noncompliance- denies   Barriers of Care:  Knowledge and compliance  Discharge Planning:  Patient plans to return to home with  wife.  He would benefit from the [place] however says that he wants to discuss with his wife and will decide before follow-up appt 8/1.

## 2017-07-14 NOTE — Progress Notes (Signed)
Per insurance check on Bidil # 3  S/W  MARTIKA  @ [facility] # [phone]   BIDLI 20-37.5 MG PER 0.5 TABLET 3 X DAILY   COVER- NOT COVER BY THE PLAN  PRIOR APPROVAL- YES # [phone] FOR EXCEPTION   AS PER [facility] PHARMACIST  ATTITUDE  1.HYDRALAZINE : CO-PAY- ZERO DOLLARS  2. ISOSORBIDE-DINITRATE: CO-PAY- $ 30.35   [facility]

## 2017-07-14 NOTE — Progress Notes (Signed)
Advanced Heart Failure Rounding Note  Primary Cardiologist: Dr. Aundra Dubin   Subjective:    Feeling great. Wants to go home. Admits he may have been drinking too much and not watching diet as closely. Did not take any sliding scale diuretics.  Out 3.2 L and down 8 lbs. Weight down to 200 lb, and brisk diuresis with IV lasix so far this am.   Echo 07/13/17 LVEF 20-25%, Moderate AR, Mild MR, Mod LAE, PA peak pressure 61 mm Hg.   Objective:   Weight Range: 200 lb 3.2 oz (90.8 kg) Body mass index is 28.73 kg/m.   Vital Signs:   Temp:  [97.6 F (36.4 C)-98.9 F (37.2 C)] 97.6 F (36.4 C) (07/18 0400) Pulse Rate:  [66-86] 68 (07/18 0913) Resp:  [14-22] 17 (07/18 0400) BP: (119-153)/(81-123) 130/81 (07/18 0913) SpO2:  [96 %-100 %] 100 % (07/18 0913) Weight:  [200 lb 3.2 oz (90.8 kg)] 200 lb 3.2 oz (90.8 kg) (07/18 0334) Last BM Date: 07/12/17  Weight change: Filed Weights   07/12/17 1413 07/13/17 0203 07/14/17 0334  Weight: 201 lb (91.2 kg) 208 lb 1.6 oz (94.4 kg) 200 lb 3.2 oz (90.8 kg)    Intake/Output:   Intake/Output Summary (Last 24 hours) at 07/14/17 0939 Last data filed at 07/14/17 0900  Gross per 24 hour  Intake              315 ml  Output             4300 ml  Net            -3985 ml      Physical Exam    General:  Well appearing. No resp difficulty HEENT: Normal Neck: Supple. JVP 7-8 cm. Carotids 2+ bilat; no bruits. No lymphadenopathy or thyromegaly appreciated. Cor: PMI nondisplaced. Regular rate & rhythm. No rubs, gallops or murmurs. Lungs: Clear Abdomen: Soft, nontender, nondistended. No hepatosplenomegaly. No bruits or masses. Good bowel sounds. Extremities: No cyanosis, clubbing, rash, edema Neuro: Alert & orientedx3, cranial nerves grossly intact. moves all 4 extremities w/o difficulty. Affect pleasant  Telemetry   Personally reviewed, NSR   EKG    07/12/17 reviewed personally. NSR with PACs. QRS 160s  Labs    CBC  Recent Labs   07/12/17 1400 07/14/17 0217  WBC 7.3 5.9  HGB 12.0* 11.8*  HCT 37.5* 36.5*  MCV 93.1 92.4  PLT 258 295   Basic Metabolic Panel  Recent Labs  07/13/17 0336 07/14/17 0217  NA 139 136  K 3.6 3.5  CL 104 99*  CO2 26 29  GLUCOSE 135* 157*  BUN 24* 30*  CREATININE 1.52* 1.71*  CALCIUM 8.6* 8.7*  MG  --  1.6*   Liver Function Tests No results for input(s): AST, ALT, ALKPHOS, BILITOT, PROT, ALBUMIN in the last 72 hours. No results for input(s): LIPASE, AMYLASE in the last 72 hours. Cardiac Enzymes  Recent Labs  07/13/17 0814 07/13/17 1433 07/13/17 2029  TROPONINI 0.18* 0.17* 0.16*    BNP: BNP (last 3 results)  Recent Labs  04/11/17 0520 05/17/17 1342 07/12/17 1400  BNP 548.9* 315.5* 1,152.4*    ProBNP (last 3 results) No results for input(s): PROBNP in the last 8760 hours.   D-Dimer No results for input(s): DDIMER in the last 72 hours. Hemoglobin A1C No results for input(s): HGBA1C in the last 72 hours. Fasting Lipid Panel No results for input(s): CHOL, HDL, LDLCALC, TRIG, CHOLHDL, LDLDIRECT in the last 72 hours. Thyroid Function  Tests No results for input(s): TSH, T4TOTAL, T3FREE, THYROIDAB in the last 72 hours.  Invalid input(s): FREET3  Other results:   Imaging     No results found.   Medications:     Scheduled Medications: . aspirin EC  81 mg Oral Daily  . atorvastatin  80 mg Oral q1800  . carvedilol  3.125 mg Oral BID WC  . Chlorhexidine Gluconate Cloth  6 each Topical Q0600  . colchicine  0.6 mg Oral Daily  . digoxin  0.125 mg Oral Daily  . docusate sodium  100 mg Oral BID  . furosemide  80 mg Intravenous Q12H  . heparin  5,000 Units Subcutaneous Q8H  . insulin aspart  0-5 Units Subcutaneous QHS  . insulin aspart  0-9 Units Subcutaneous TID WC  . mometasone-formoterol  2 puff Inhalation BID  . mupirocin ointment  1 application Nasal BID  . sacubitril-valsartan  1 tablet Oral BID  . sodium chloride flush  3 mL Intravenous Q12H   . spironolactone  12.5 mg Oral Daily  . tiotropium  18 mcg Inhalation Daily     Infusions: . sodium chloride    . magnesium sulfate 1 - 4 g bolus IVPB       PRN Medications:  sodium chloride, acetaminophen, albuterol, hydrALAZINE, ondansetron (ZOFRAN) IV, sodium chloride flush   Patient Profile   Patrick Stewart is a 61 y.o. male with history of ICM, CAD s/p CABG x 2 (SVG -> OM1, LIMA -> LAD) in 2010, COPD, DMII, Gout, tobacco abuse, and HTN. Admitted for worsening SOB and hypertensive urgency.   Assessment/Plan   1. Acute on chronic systolic CHF - Echo 06/10/42 LVEF 15%, moderately dilated LV, inferior and inferolateral akinesis, septal-lateral dyssynchrony.  - R/LHC 04/15/17 stable coronary disease and adequate cardiac output.  - Volume status improved.  - Continue Entresto 24/26 mg BID - Continue spiro 12.5 mg daily. - Start Bidil 0.5 tab TID. Will have Care management see for Discount card.  - EF remains low. Will refer to EP as outpatient. For CRT-D consideration.   2. Acute resp failure - Now off Bipap. Much improved.   3. HTN with hypertensive urgency - Improved back on [place]. Adding low dose bidil as above.    4. CKD stage II - Relatively stable. Continue to follow closely.   5. CAD s/p CABG x 2 in 2010 at [facility] - Stable by cath in 03/2017 despite fall in EF  6. LBBB - Dyssynchrony on ECG. Suspect would benefit from CRT - Will need outpatient referral   7. DM2 - Per primary.   HF navigator to see today to consider for paramedicine.   Pt OK for home today from HF perspective  He has follow up 07/28/17  HF meds for home - Torsemide 40 mg daily - Entresto 24/26 mg BID - Spiro 12.5 mg daily - Bidil 0.5 tab TID - digoxin 0.125 daily - Coreg 3.125 bid - ASA 81 daily - atorvastatin 80 daily - Galactic study drug  Length of Stay: 2  Patrick Stewart  07/14/2017, 9:39 AM  Advanced Heart Failure Team Pager [phone] (M-F;  7a - 4p)  Please contact Patrick Stewart for night-coverage after hours (4p -7a ) and weekends on [email or web address]  Patient seen with PA, agree with the above note.  He diuresed well overnight, weight down.  Feeling much better.    I think he can go home today.  Will increase torsemide to 40 mg daily for home.  Discussed sodium restriction and compliance with meds.  Start on low dose Bidil, may need help getting the medication.   Will need evaluation by EP for BiV ICD.   Patrick Stewart  07/14/2017 11:02 AM

## 2017-07-15 ENCOUNTER — Telehealth: Payer: Self-pay | Admitting: Behavioral Health

## 2017-07-15 NOTE — Telephone Encounter (Signed)
Transition Care Management Follow-up Telephone Call   Date discharged? 07/14/17   How have you been since you were released from the hospital? Patient stated, "I'm doing good, now that all the fluid has been drained off of me".   Do you understand why you were in the hospital? yes, pt. reported he was in the hospital for CHF.   Do you understand the discharge instructions? yes   Where were you discharged to? Home   Items Reviewed:  Medications reviewed: yes  Allergies reviewed: yes  Dietary changes reviewed: yes, pt. voiced low sodium diet.  Referrals reviewed: yes, follow-up with PCP & Dr. Loralie Champagne, Cardiology   Functional Questionnaire:   Activities of Daily Living (ADLs):   He states they are independent in the following: ambulation, bathing and hygiene, feeding, continence, grooming, toileting and dressing States they require assistance with the following: None   Any transportation issues/concerns?: no   Any patient concerns? no   Confirmed importance and date/time of follow-up visits scheduled yes, 07/22/17 at 8:30 AM.  Provider Appointment booked with Dr. Nani Ravens.  Confirmed with patient if condition begins to worsen call PCP or go to the ER.  Patient was given the office number and encouraged to call back with question or concerns.  : yes

## 2017-07-17 NOTE — Discharge Summary (Signed)
[facility] Discharge Summary   Patient: Patrick Stewart ZOX:[id number]   PCP: Shelda Pal, DO DOB: July 29, 1956   Date of admission: 07/12/2017   Date of discharge: 07/14/2017    Discharge Diagnoses:  Principal Problem:   Acute on chronic systolic CHF (congestive heart failure) (HCC) Active Problems:   CKD (chronic kidney disease), stage III   CAD Status post CABG 2 SVG to OM 1, LIMA to mid LAD:   Essential hypertension   COPD with chronic bronchitis ([place])   Diabetes mellitus type 2, uncontrolled ([place])   LBBB (left bundle branch block)   Hypertensive urgency   Acute respiratory failure with hypoxia ([place])   Admitted From: hoem Disposition:  home  Recommendations for Outpatient Follow-up:  1. Follow up with PCP in one week or 2. Please remain compliant with medical treatment recommended. 3. Follow up with cardiology as recommended as well.   Follow-up Information    Larey Dresser, MD Follow up on 07/28/2017.   Specialty:  Cardiology Why:  at 1100 am for post hospital follow up. Code for parking is 7001 (Try 7000 if this does not work). Code changes on the first of every month, but may not have changed by the time you arrive.  Contact information: [place] [address] [place], [place], DO. Schedule an appointment as soon as possible for a visit in 1 week(s).   Specialty:  Family Medicine Contact information: [place] [address] [phone]          Diet recommendation: cardiac diet  Activity: The patient is advised to gradually reintroduce usual activities.  Discharge Condition: good  Code Status: full code  History of present illness: As per the H and P dictated on admission, "Patrick Stewart is a 61 y.o. male with medical history significant for  CAD status post CABG, hypertension, type 2 diabetes mellitus, chronic kidney disease stage III, and chronic  systolic CHF with EF 84-69%, no presenting to the emergency department for evaluation of progressive bilateral leg swelling and shortness of breath. Patient reports that he bit his usual state of health until approximately 3-4 days ago when he noted the insidious development of edema in the bilateral lower extremities and concomitant DOE. Over the ensuing days, his symptoms continued to worsen with his legs becoming increasingly edematous bilaterally and with shortness of breath now occurring while at rest. Patient reports that earlier today, he was having difficulty catching his breath while seated, called his doctor's office for advice, and was directed to the ED for evaluation of this. He denies any recent fevers or chills and denies any chest pain or palpitations. Denies any significant cough. Denies use of illicit substances and denies dietary indiscretions. Of note, he is a participant in a research study with his cardiologist."  Hospital Course:  Summary of his active problems in the hospital is as following. #1 acute respiratory failure with hypoxia secondary to acute on chronic systolic heart failure/CAD patient presented with a worsening shortness of breath with a past 4-5 days, worsening bilateral lower extremity edema, generalized weakness and noted on exam and chest x-ray to be volume overloaded. BNP elevated at 1152.4. Cardiac enzymes initial troponin level elevated at 0.18. Patient endorses compliance with medication and diet, although per cardiology note his compliance is questionable. Echocardiogram shows no significant change in EF, 20-45%. Patient was placed on BiPAP on admission, currently on room  air. Lasix 40 mg IV every 12 hours, Coreg, Lipitor, aspirin, digoxin.  Patient was on the research medication which has been resumed per research team.  Heart failure team was consulted. Patient was felt stable to be discharged home on 07/15/2017 with increased dose of torsemide as well as  addition of imdur  #2 chronic kidney disease stage III Some improvement with renal function with creatinine currently at 1.5 to from 1.75 on admission. Patient's worsening renal function may be likely due to his poor cardiac output.   #3 elevated troponin Likely secondary to volume overload secondary to problem #1 versus acute coronary syndrome. EKG with a chronic left bundle branch block. Continue to cycle cardiac enzymes. Continue aspirin, Lasix, Lipitor, Coreg, digoxin. Consult with the heart failure team.  #4 hypertensive urgency Likely secondary to problem #1. Improving with diuresis. Continue Lasix, Coreg. Follow.  #5 COPD Stable. Place on Spiriva and Symbicort. Nebs as needed.  #6 type 2 diabetes mellitus Hemoglobin A1c 7.8 on 03/2017 Continue home regimen  #7 history of gout Patient states had a recent flare of gout which has improved. Continue allopurinol.  All other chronic medical condition were stable during the hospitalization.  Patient was ambulatory without any assistance. On the day of the discharge the patient's vitals were stable, and no other acute medical condition were reported by patient. the patient was felt safe to be discharge at home with family.  Procedures and Results:  Echocardiogram  Study Conclusions  - Left ventricle: The cavity size was moderately dilated. Wall   thickness was increased in a pattern of mild LVH. Systolic   function was severely reduced. The estimated ejection fraction   was in the range of 20% to 25%. Diffuse hypokinesis. There is   akinesis of the inferior myocardium. - Ventricular septum: Septal motion showed abnormal function and   dyssynergy. - Aortic valve: There was moderate regurgitation. - Aorta: Aortic root dimension: 39 mm (ED). - Ascending aorta: The ascending aorta was mildly dilated. - Mitral valve: There was mild regurgitation. Valve area by   pressure half-time: 1.35 cm^2. - Left atrium: The atrium was  moderately dilated. - Pulmonary arteries: Systolic pressure was moderately to severely   increased. PA peak pressure: 61 mm Hg (S).   Consultations:  cardiology  DISCHARGE MEDICATION: Discharge Medication List as of 07/14/2017 12:49 PM    START taking these medications   Details  isosorbide-hydrALAZINE (BIDIL) 20-37.5 MG tablet Take 0.5 tablets by mouth 3 (three) times daily., Starting Wed 07/14/2017, Normal      CONTINUE these medications which have CHANGED   Details  torsemide (DEMADEX) 20 MG tablet Take 2 tablets (40 mg total) by mouth daily., Starting Thu 07/15/2017, Normal      CONTINUE these medications which have NOT CHANGED   Details  albuterol (PROVENTIL HFA;VENTOLIN HFA) 108 (90 Base) MCG/ACT inhaler Inhale 2 puffs into the lungs every 6 (six) hours as needed for wheezing or shortness of breath., Historical Med    albuterol (PROVENTIL) (2.5 MG/3ML) 0.083% nebulizer solution Take 3 mLs (2.5 mg total) by nebulization every 6 (six) hours as needed for wheezing or shortness of breath., Starting Wed 08/26/2016, Normal    allopurinol (ZYLOPRIM) 300 MG tablet TAKE 1 TABLET BY MOUTH TWICE A DAY, Normal    aspirin EC 81 MG EC tablet Take 1 tablet (81 mg total) by mouth daily., Starting Fri 04/16/2017, Normal    atorvastatin (LIPITOR) 80 MG tablet Take 1 tablet (80 mg total) by mouth daily., Starting  Wed 08/26/2016, Normal    carvedilol (COREG) 3.125 MG tablet Take 1 tablet (3.125 mg total) by mouth 2 (two) times daily., Starting Mon 04/26/2017, Normal    colchicine 0.6 MG tablet Take 1 tablet (0.6 mg total) by mouth 2 (two) times daily., Starting Thu 01/07/2017, Normal    digoxin (LANOXIN) 0.125 MG tablet Take 1 tablet (0.125 mg total) by mouth daily., Starting Fri 04/16/2017, Normal    docusate sodium (COLACE) 100 MG capsule Take 1 capsule (100 mg total) by mouth 2 (two) times daily., Starting Fri 10/02/2016, Normal    famotidine (PEPCID) 20 MG tablet Take 1 tablet (20 mg total) by  mouth 2 (two) times daily., Starting Wed 04/29/2016, Print    glucose blood test strip Use as instructed, Normal    Investigational - Study Medication Take 1 tablet by mouth 2 (two) times daily. Study name: Galactic HF Study Additional study details: Omecamtiv Mecarbil or Placebo, Starting Thu 04/15/2017, Print    Lancets (FREESTYLE) lancets Use to check sugars daily, Normal    metFORMIN (GLUCOPHAGE) 500 MG tablet Week 1: 1 tab daily Week 2: 1 tab twice daily Week 3: 2 tabs in AM, 1 in evening Week 4: 2 tabs twice daily, Normal    OXYGEN Inhale into the lungs as needed (shortness of breath)., Historical Med    sacubitril-valsartan (ENTRESTO) 24-26 MG Take 1 tablet by mouth 2 (two) times daily., Starting Thu 04/15/2017, Normal    spironolactone (ALDACTONE) 25 MG tablet Take 25 mg by mouth daily., Historical Med    polyethylene glycol (MIRALAX / GLYCOLAX) packet Take 17 g by mouth daily., Starting Sat 10/03/2016, Print       Allergies  Allergen Reactions  . Hydrocodone Itching  . Tramadol Nausea Only   Discharge Instructions    Diet - low sodium heart healthy    Complete by:  As directed    Discharge instructions    Complete by:  As directed    It is important that you read following instructions as well as go over your medication list with RN to help you understand your care after this hospitalization.  Discharge Instructions: Please follow-up with PCP in one week  Please request your primary care physician to go over all Hospital Tests and Procedure/Radiological results at the follow up,  Please get all Hospital records sent to your PCP by signing hospital release before you go home.   Do not take more than prescribed Pain, Sleep and Anxiety Medications. You were cared for by a hospitalist during your hospital stay. If you have any questions about your discharge medications or the care you received while you were in the hospital after you are discharged, you can call the unit and  ask to speak with the hospitalist on call if the hospitalist that took care of you is not available.  Once you are discharged, your primary care physician will handle any further medical issues. Please note that NO REFILLS for any discharge medications will be authorized once you are discharged, as it is imperative that you return to your primary care physician (or establish a relationship with a primary care physician if you do not have one) for your aftercare needs so that they can reassess your need for medications and monitor your lab values. You Must read complete instructions/literature along with all the possible adverse reactions/side effects for all the Medicines you take and that have been prescribed to you. Take any new Medicines after you have completely understood and accept all  the possible adverse reactions/side effects. Wear Seat belts while driving. If you have smoked or chewed Tobacco in the last 2 yrs please stop smoking and/or stop any Recreational drug use.   Increase activity slowly    Complete by:  As directed      Discharge Exam: Filed Weights   07/12/17 1413 07/13/17 0203 07/14/17 0334  Weight: 91.2 kg (201 lb) 94.4 kg (208 lb 1.6 oz) 90.8 kg (200 lb 3.2 oz)   Vitals:   07/14/17 1200 07/14/17 1300  BP: 121/81   Pulse: 66   Resp: (!) 22   Temp:  (!) 97.5 F (36.4 C)   General: Appear in no distress, no Rash; Oral Mucosa moist. Cardiovascular: S1 and S2 Present, no Murmur, no JVD Respiratory: Bilateral Air entry present and Clear to Auscultation, no Crackles, no wheezes Abdomen: Bowel Sound present, Soft and no tenderness Extremities: bilateral Pedal edema, no calf tenderness Neurology: Grossly no focal neuro deficit.  The results of significant diagnostics from this hospitalization (including imaging, microbiology, ancillary and laboratory) are listed below for reference.    Significant Diagnostic Studies: Dg Chest 2 View  Result Date: 07/12/2017 CLINICAL  DATA:  61 y/o M; shortness of breath worsening since Friday. EXAM: CHEST  2 VIEW COMPARISON:  04/11/2017 chest radiograph. FINDINGS: Stable moderate cardiomegaly post CABG. Sternotomy wires are aligned. Interstitial pulmonary edema. Chronic reticular markings of the lung peripheries probably represents mild fibrosis. No acute osseous abnormality is evident. Blunted right costal diaphragmatic angle. IMPRESSION: 1. Mild interstitial pulmonary edema. 2. Stable moderate cardiomegaly. 3. Blunted right costal diaphragmatic angle, possible small effusion. Electronically Signed   By: Kristine Garbe M.D.   On: 07/12/2017 15:27    Microbiology: Recent Results (from the past 240 hour(s))  MRSA PCR Screening     Status: Abnormal   Collection Time: 07/13/17  2:17 AM  Result Value Ref Range Status   MRSA by PCR POSITIVE (A) NEGATIVE Final    Comment:        The GeneXpert MRSA Assay (FDA approved for NASAL specimens only), is one component of a comprehensive MRSA colonization surveillance program. It is not intended to diagnose MRSA infection nor to guide or monitor treatment for MRSA infections. RESULT CALLED TO, READ BACK BY AND VERIFIED WITH: B GAYE, RN 07/13/17 0518 L CHAMPION      Labs: CBC:  Recent Labs Lab 07/12/17 1400 07/14/17 0217  WBC 7.3 5.9  HGB 12.0* 11.8*  HCT 37.5* 36.5*  MCV 93.1 92.4  PLT 258 086   Basic Metabolic Panel:  Recent Labs Lab 07/12/17 1400 07/13/17 0336 07/14/17 0217  NA 138 139 136  K 4.0 3.6 3.5  CL 103 104 99*  CO2 26 26 29   GLUCOSE 127* 135* 157*  BUN 24* 24* 30*  CREATININE 1.75* 1.52* 1.71*  CALCIUM 8.8* 8.6* 8.7*  MG  --   --  1.6*   Liver Function Tests: No results for input(s): AST, ALT, ALKPHOS, BILITOT, PROT, ALBUMIN in the last 168 hours. No results for input(s): LIPASE, AMYLASE in the last 168 hours. No results for input(s): AMMONIA in the last 168 hours. Cardiac Enzymes:  Recent Labs Lab 07/13/17 0814 07/13/17 1433  07/13/17 2029  TROPONINI 0.18* 0.17* 0.16*   BNP (last 3 results)  Recent Labs  04/11/17 0520 05/17/17 1342 07/12/17 1400  BNP 548.9* 315.5* 1,152.4*   CBG:  Recent Labs Lab 07/13/17 1123 07/13/17 1649 07/13/17 2109 07/14/17 0626 07/14/17 1135  GLUCAP 198* 114* 140* 171* 141*  Time spent: 35 minutes  Signed:  Berle Mull  [facility] 07/14/2017 , 4:24 PM

## 2017-07-20 ENCOUNTER — Other Ambulatory Visit (HOSPITAL_COMMUNITY): Payer: Self-pay | Admitting: *Deleted

## 2017-07-22 ENCOUNTER — Encounter: Payer: Self-pay | Admitting: Family Medicine

## 2017-07-22 ENCOUNTER — Ambulatory Visit (INDEPENDENT_AMBULATORY_CARE_PROVIDER_SITE_OTHER): Payer: Medicare Other | Admitting: Family Medicine

## 2017-07-22 VITALS — BP 168/101 | HR 87 | Temp 97.6°F | Ht 70.0 in | Wt 200.8 lb

## 2017-07-22 DIAGNOSIS — I509 Heart failure, unspecified: Secondary | ICD-10-CM

## 2017-07-22 DIAGNOSIS — I10 Essential (primary) hypertension: Secondary | ICD-10-CM | POA: Diagnosis not present

## 2017-07-22 DIAGNOSIS — Z09 Encounter for follow-up examination after completed treatment for conditions other than malignant neoplasm: Secondary | ICD-10-CM

## 2017-07-22 NOTE — Progress Notes (Signed)
Chief Complaint  Patient presents with  . Hospitalization Follow-up    for CHF    HPI Patrick Stewart is a 61 y.o. y.o. male who presents for a transition of care visit.  Pt was discharged from [facility] on 07/14/17.  Within 48 business hours of discharge our office contacted him via telephone to coordinate his care and needs.   The patient was admitted for an acute exacerbation of heart failure. He was retaining fluid, having difficulty breathing, and had gained weight. He was diuresis in the hospital and lost around 10 pounds. The swelling in his legs have resolved. He's no longer having shortness of breath. His cardiologist is reportedly considering placing an ICD. He is currently taking Demadex 40 mg daily. He is tolerating this medicine well.  Social History   Social History  . Marital status: Married   Occupational History  . disabled    Social History Main Topics  . Smoking status: Former Smoker    Packs/day: 0.50    Years: 30.00    Quit date: 07/10/2016  . Smokeless tobacco: Never Used  . Alcohol use No  . Drug use: No  . Sexual activity: Yes   Past Medical History:  Diagnosis Date  . Angina at rest [facility]) 07/08/2016  . CAD (coronary artery disease) 2010   3v CABG  . Chest pain at rest 07/08/2016  . CHF (congestive heart failure) ([place]) 01/26/2012  . CKD (chronic kidney disease) 02/19/2012  . COPD with chronic bronchitis ([place]) 08/26/2016  . Diabetes mellitus type 2, uncontrolled ([place]) 01/26/2012  . Elevated liver function tests 01/26/2012  . Gout 01/26/2012  . Hx of CABG 2010   RCA Stent 2008, CABG x 2 [facility] 2010  . Hypercholesterolemia 07/08/2016  . Hypertension 09/04/2014  . Ischemic cardiomyopathy 11/15/2014  . Kidney stone 10/25/2015  . Lipid disorder 01/26/2012  . NSTEMI (non-ST elevated myocardial infarction) ([place]) 07/08/2016  . ST elevation myocardial infarction (STEMI) ([place]) 2010  . Unstable angina ([place]) 11/14/2014   Family History  Problem Relation Age of Onset   . Hypertension Mother   . Diabetes Mother   . Hyperlipidemia Mother   . Kidney failure Mother   . Emphysema Father    Allergies as of 07/22/2017      Reactions   Hydrocodone Itching   Tramadol Nausea Only      Medication List       Accurate as of 07/22/17  9:00 AM. Always use your most recent med list.          albuterol 108 (90 Base) MCG/ACT inhaler Commonly known as:  PROVENTIL HFA;VENTOLIN HFA Inhale 2 puffs into the lungs every 6 (six) hours as needed for wheezing or shortness of breath.   albuterol (2.5 MG/3ML) 0.083% nebulizer solution Commonly known as:  PROVENTIL Take 3 mLs (2.5 mg total) by nebulization every 6 (six) hours as needed for wheezing or shortness of breath.   allopurinol 300 MG tablet Commonly known as:  ZYLOPRIM TAKE 1 TABLET BY MOUTH TWICE A DAY   aspirin 81 MG EC tablet Take 1 tablet (81 mg total) by mouth daily.   atorvastatin 80 MG tablet Commonly known as:  LIPITOR Take 1 tablet (80 mg total) by mouth daily.   carvedilol 3.125 MG tablet Commonly known as:  COREG Take 1 tablet (3.125 mg total) by mouth 2 (two) times daily.   digoxin 0.125 MG tablet Commonly known as:  LANOXIN Take 1 tablet (0.125 mg total) by mouth daily.   freestyle  lancets Use to check sugars daily   glucose blood test strip Use as instructed   Investigational - Study Medication Take 1 tablet by mouth 2 (two) times daily. Study name: Galactic HF Study Additional study details: Omecamtiv Mecarbil or Placebo   isosorbide-hydrALAZINE 20-37.5 MG tablet Commonly known as:  BIDIL Take 0.5 tablets by mouth 3 (three) times daily.   metFORMIN 500 MG tablet Commonly known as:  GLUCOPHAGE Week 1: 1 tab daily Week 2: 1 tab twice daily Week 3: 2 tabs in AM, 1 in evening Week 4: 2 tabs twice daily   sacubitril-valsartan 24-26 MG Commonly known as:  ENTRESTO Take 1 tablet by mouth 2 (two) times daily.   spironolactone 25 MG tablet Commonly known as:  ALDACTONE Take 25  mg by mouth daily.   torsemide 20 MG tablet Commonly known as:  DEMADEX Take 2 tablets (40 mg total) by mouth daily.       ROS:  Constitutional: No fevers or chills, no weight loss HEENT: No headaches, hearing loss, or runny nose, no sore throat Heart: No chest pain Lungs: No SOB, no cough Abd: No bowel changes, no pain, no N/V GU: No urinary complaints Neuro: No numbness, tingling or weakness Msk: No current joint or muscle pain  Objective BP (!) 168/101 (BP Location: Left Arm, Patient Position: Sitting, Cuff Size: Normal)   Pulse 87   Temp 97.6 F (36.4 C) (Oral)   Ht 5\' 10"  (1.778 m)   Wt 200 lb 12.8 oz (91.1 kg)   SpO2 100%   BMI 28.81 kg/m  General Appearance:  awake, alert, oriented, in no acute distress and well developed, well nourished Skin:  there are no suspicious lesions or rashes of concern Head/face:  NCAT Eyes:  EOMI, PERRLA Ears:  canals and TMs NI Nose/Sinuses:  negative Mouth/Throat:  Mucosa moist, no lesions; pharynx without erythema, edema or exudate. Neck:  neck- supple, no mass, non-tender and no jvd Lungs: Clear to auscultation.  No rales, rhonchi, or wheezing. Normal effort, no accessory muscle use. Heart:  Heart sounds are normal.  Regular rate and rhythm without murmur, gallop or rub. No bruits. Abdomen:  BS+, soft, NT, ND, no masses or organomegaly Musculoskeletal:  No muscle group atrophy or asymmetry, gait normal Neurologic:  Alert and oriented x 3, gait normal., reflexes normal and symmetric, strength and  sensation grossly normal Psych exam: Nml mood and affect, age appropriate judgment and insight  Hospital discharge follow-up - Plan: CBC, Basic metabolic panel  Discharge summary and medication list have been reviewed/reconciled.  Labs pending at the time of discharge have been reviewed or are still pending at the time of this visit.  Follow-up labs and appointments have been ordered and/or coordinated appropriately. Educational materials  regarding the patient's admitting diagnosis provided.  TRANSITIONAL CARE MANAGEMENT CERTIFICATION:  I certify the following are true:   1. Communication with the patient/care giver was made within 2 business days of discharge.  2. Complexity of Medical decision making is moderate.  3. Face to face visit occurred within 14 days of discharge.   BP elevated today, he did not take his medicine today. Reports it is normally around 130's/80's at home.  F/u in 2-4 weeks to recheck BP and DM visit. The patient voiced understanding and agreement to the plan.  [place], DO 07/22/17 9:00 AM

## 2017-07-22 NOTE — Patient Instructions (Signed)
Foods to AVOID: Red meat, organ meat (liver), lunch meat, seafood (mussels, scallops, anchovies, etc) Alcohol Sugary foods/beverages (diet soft drinks have no link to flares)  Foods to migrate to: Dairy Vegetables Cherries have limited data to suggest they help lower uric acid levels (and prevent flares) Vit C (500 mg daily) may have a modest effect with preventing flares Poultry If you are going to eat red meat, beef and pork may give you less problems than lamb.   

## 2017-07-27 ENCOUNTER — Telehealth (HOSPITAL_COMMUNITY): Payer: Self-pay | Admitting: Pharmacist

## 2017-07-27 NOTE — Telephone Encounter (Signed)
Bidil (#90/30 days) PA approved by [facility] until further notice.   Ruta Hinds. Velva Harman, PharmD, BCPS, CPP Clinical Pharmacist Pager: [phone] Phone: [phone] 07/27/2017 3:11 PM

## 2017-07-28 ENCOUNTER — Observation Stay (HOSPITAL_BASED_OUTPATIENT_CLINIC_OR_DEPARTMENT_OTHER)
Admission: EM | Admit: 2017-07-28 | Discharge: 2017-07-28 | Disposition: A | Discharge status: Home / Self Care | Payer: Medicare Other | Attending: Family Medicine | Admitting: Family Medicine

## 2017-07-28 ENCOUNTER — Emergency Department (HOSPITAL_BASED_OUTPATIENT_CLINIC_OR_DEPARTMENT_OTHER): Payer: Medicare Other

## 2017-07-28 ENCOUNTER — Encounter (HOSPITAL_BASED_OUTPATIENT_CLINIC_OR_DEPARTMENT_OTHER): Payer: Self-pay | Admitting: Emergency Medicine

## 2017-07-28 ENCOUNTER — Inpatient Hospital Stay (HOSPITAL_COMMUNITY): Admission: RE | Admit: 2017-07-28 | Payer: Medicare Other | Source: Ambulatory Visit | Admitting: Cardiology

## 2017-07-28 DIAGNOSIS — Z79899 Other long term (current) drug therapy: Secondary | ICD-10-CM | POA: Diagnosis not present

## 2017-07-28 DIAGNOSIS — Z794 Long term (current) use of insulin: Secondary | ICD-10-CM | POA: Insufficient documentation

## 2017-07-28 DIAGNOSIS — J449 Chronic obstructive pulmonary disease, unspecified: Secondary | ICD-10-CM | POA: Diagnosis not present

## 2017-07-28 DIAGNOSIS — Z87442 Personal history of urinary calculi: Secondary | ICD-10-CM | POA: Insufficient documentation

## 2017-07-28 DIAGNOSIS — R0789 Other chest pain: Principal | ICD-10-CM | POA: Insufficient documentation

## 2017-07-28 DIAGNOSIS — Z888 Allergy status to other drugs, medicaments and biological substances status: Secondary | ICD-10-CM | POA: Diagnosis not present

## 2017-07-28 DIAGNOSIS — N183 Chronic kidney disease, stage 3 unspecified: Secondary | ICD-10-CM | POA: Diagnosis present

## 2017-07-28 DIAGNOSIS — E7800 Pure hypercholesterolemia, unspecified: Secondary | ICD-10-CM | POA: Insufficient documentation

## 2017-07-28 DIAGNOSIS — Z87891 Personal history of nicotine dependence: Secondary | ICD-10-CM | POA: Diagnosis not present

## 2017-07-28 DIAGNOSIS — I5022 Chronic systolic (congestive) heart failure: Secondary | ICD-10-CM | POA: Insufficient documentation

## 2017-07-28 DIAGNOSIS — I447 Left bundle-branch block, unspecified: Secondary | ICD-10-CM | POA: Diagnosis not present

## 2017-07-28 DIAGNOSIS — Z951 Presence of aortocoronary bypass graft: Secondary | ICD-10-CM | POA: Insufficient documentation

## 2017-07-28 DIAGNOSIS — E1122 Type 2 diabetes mellitus with diabetic chronic kidney disease: Secondary | ICD-10-CM | POA: Diagnosis not present

## 2017-07-28 DIAGNOSIS — I2510 Atherosclerotic heart disease of native coronary artery without angina pectoris: Secondary | ICD-10-CM | POA: Diagnosis present

## 2017-07-28 DIAGNOSIS — I252 Old myocardial infarction: Secondary | ICD-10-CM | POA: Insufficient documentation

## 2017-07-28 DIAGNOSIS — K219 Gastro-esophageal reflux disease without esophagitis: Secondary | ICD-10-CM | POA: Insufficient documentation

## 2017-07-28 DIAGNOSIS — I130 Hypertensive heart and chronic kidney disease with heart failure and stage 1 through stage 4 chronic kidney disease, or unspecified chronic kidney disease: Secondary | ICD-10-CM | POA: Diagnosis not present

## 2017-07-28 DIAGNOSIS — J4489 Other specified chronic obstructive pulmonary disease: Secondary | ICD-10-CM | POA: Diagnosis present

## 2017-07-28 DIAGNOSIS — R7989 Other specified abnormal findings of blood chemistry: Secondary | ICD-10-CM

## 2017-07-28 DIAGNOSIS — Z885 Allergy status to narcotic agent status: Secondary | ICD-10-CM | POA: Diagnosis not present

## 2017-07-28 DIAGNOSIS — M109 Gout, unspecified: Secondary | ICD-10-CM | POA: Diagnosis present

## 2017-07-28 DIAGNOSIS — E118 Type 2 diabetes mellitus with unspecified complications: Secondary | ICD-10-CM | POA: Diagnosis not present

## 2017-07-28 DIAGNOSIS — E785 Hyperlipidemia, unspecified: Secondary | ICD-10-CM | POA: Diagnosis not present

## 2017-07-28 DIAGNOSIS — Z7982 Long term (current) use of aspirin: Secondary | ICD-10-CM | POA: Diagnosis not present

## 2017-07-28 DIAGNOSIS — R079 Chest pain, unspecified: Secondary | ICD-10-CM | POA: Diagnosis not present

## 2017-07-28 DIAGNOSIS — R072 Precordial pain: Secondary | ICD-10-CM

## 2017-07-28 DIAGNOSIS — IMO0002 Reserved for concepts with insufficient information to code with codable children: Secondary | ICD-10-CM | POA: Diagnosis present

## 2017-07-28 DIAGNOSIS — N401 Enlarged prostate with lower urinary tract symptoms: Secondary | ICD-10-CM | POA: Diagnosis present

## 2017-07-28 DIAGNOSIS — I255 Ischemic cardiomyopathy: Secondary | ICD-10-CM | POA: Diagnosis not present

## 2017-07-28 DIAGNOSIS — E1165 Type 2 diabetes mellitus with hyperglycemia: Secondary | ICD-10-CM | POA: Diagnosis present

## 2017-07-28 DIAGNOSIS — Z955 Presence of coronary angioplasty implant and graft: Secondary | ICD-10-CM | POA: Diagnosis not present

## 2017-07-28 DIAGNOSIS — R778 Other specified abnormalities of plasma proteins: Secondary | ICD-10-CM

## 2017-07-28 DIAGNOSIS — R748 Abnormal levels of other serum enzymes: Secondary | ICD-10-CM | POA: Diagnosis not present

## 2017-07-28 DIAGNOSIS — M2550 Pain in unspecified joint: Secondary | ICD-10-CM

## 2017-07-28 DIAGNOSIS — G8929 Other chronic pain: Secondary | ICD-10-CM | POA: Diagnosis present

## 2017-07-28 LAB — CBC
HEMATOCRIT: 42 % (ref 39.0–52.0)
Hemoglobin: 14 g/dL (ref 13.0–17.0)
MCH: 30.5 pg (ref 26.0–34.0)
MCHC: 33.3 g/dL (ref 30.0–36.0)
MCV: 91.5 fL (ref 78.0–100.0)
Platelets: 278 10*3/uL (ref 150–400)
RBC: 4.59 MIL/uL (ref 4.22–5.81)
RDW: 14.8 % (ref 11.5–15.5)
WBC: 5.5 10*3/uL (ref 4.0–10.5)

## 2017-07-28 LAB — LIPID PANEL
CHOL/HDL RATIO: 6.7 ratio
Cholesterol: 194 mg/dL (ref 0–200)
HDL: 29 mg/dL — ABNORMAL LOW (ref >40)
LDL CALC: 130 mg/dL — AB (ref 0–99)
Triglycerides: 176 mg/dL — ABNORMAL HIGH (ref <150)
VLDL: 35 mg/dL (ref 0–40)

## 2017-07-28 LAB — BASIC METABOLIC PANEL
Anion gap: 11 (ref 5–15)
BUN: 39 mg/dL — AB (ref 6–20)
CHLORIDE: 96 mmol/L — AB (ref 101–111)
CO2: 28 mmol/L (ref 22–32)
CREATININE: 2.06 mg/dL — AB (ref 0.61–1.24)
Calcium: 9 mg/dL (ref 8.9–10.3)
GFR calc Af Amer: 38 mL/min — ABNORMAL LOW (ref >60)
GFR calc non Af Amer: 33 mL/min — ABNORMAL LOW (ref >60)
GLUCOSE: 245 mg/dL — AB (ref 65–99)
POTASSIUM: 4.7 mmol/L (ref 3.5–5.1)
Sodium: 135 mmol/L (ref 135–145)

## 2017-07-28 LAB — TROPONIN I
Troponin I: 0.15 ng/mL (ref <0.03)
Troponin I: 0.16 ng/mL (ref <0.03)
Troponin I: 0.18 ng/mL (ref <0.03)
Troponin I: 0.29 ng/mL (ref <0.03)

## 2017-07-28 LAB — GLUCOSE, CAPILLARY
GLUCOSE-CAPILLARY: 126 mg/dL — AB (ref 65–99)
Glucose-Capillary: 239 mg/dL — ABNORMAL HIGH (ref 65–99)

## 2017-07-28 LAB — BRAIN NATRIURETIC PEPTIDE: B Natriuretic Peptide: 122.8 pg/mL — ABNORMAL HIGH (ref 0.0–100.0)

## 2017-07-28 LAB — MRSA PCR SCREENING: MRSA BY PCR: NEGATIVE

## 2017-07-28 MED ORDER — ALPRAZOLAM 0.25 MG PO TABS: 0.2500 mg | ORAL_TABLET | Freq: Two times a day (BID) | ORAL | Status: DC | PRN

## 2017-07-28 MED ORDER — ALBUTEROL SULFATE (2.5 MG/3ML) 0.083% IN NEBU: 2.5000 mg | INHALATION_SOLUTION | Freq: Four times a day (QID) | RESPIRATORY_TRACT | Status: DC | PRN

## 2017-07-28 MED ORDER — SODIUM CHLORIDE 0.9 % IV SOLN: INTRAVENOUS | Status: DC

## 2017-07-28 MED ORDER — HEPARIN SODIUM (PORCINE) 5000 UNIT/ML IJ SOLN
5000.0000 [IU] | Freq: Three times a day (TID) | INTRAMUSCULAR | Status: DC
  Administered 2017-07-28: 5000 [IU] via SUBCUTANEOUS
  Filled 2017-07-28: qty 1

## 2017-07-28 MED ORDER — DIGOXIN 125 MCG PO TABS
0.1250 mg | ORAL_TABLET | Freq: Every day | ORAL | Status: DC
  Administered 2017-07-28: 0.125 mg via ORAL
  Filled 2017-07-28: qty 1

## 2017-07-28 MED ORDER — ISOSORB DINITRATE-HYDRALAZINE 20-37.5 MG PO TABS
0.5000 | ORAL_TABLET | Freq: Three times a day (TID) | ORAL | Status: DC
  Administered 2017-07-28: 0.5 via ORAL
  Filled 2017-07-28: qty 1

## 2017-07-28 MED ORDER — SACUBITRIL-VALSARTAN 24-26 MG PO TABS
1.0000 | ORAL_TABLET | Freq: Two times a day (BID) | ORAL | Status: DC
  Administered 2017-07-28: 1 via ORAL
  Filled 2017-07-28 (×2): qty 1

## 2017-07-28 MED ORDER — CARVEDILOL 3.125 MG PO TABS
3.1250 mg | ORAL_TABLET | Freq: Two times a day (BID) | ORAL | Status: DC
  Administered 2017-07-28 (×2): 3.125 mg via ORAL
  Filled 2017-07-28 (×2): qty 1

## 2017-07-28 MED ORDER — GI COCKTAIL ~~LOC~~: 30.0000 mL | Freq: Four times a day (QID) | ORAL | Status: DC | PRN

## 2017-07-28 MED ORDER — ALLOPURINOL 300 MG PO TABS
300.0000 mg | ORAL_TABLET | Freq: Two times a day (BID) | ORAL | Status: DC
  Administered 2017-07-28: 300 mg via ORAL
  Filled 2017-07-28: qty 1

## 2017-07-28 MED ORDER — ASPIRIN EC 81 MG PO TBEC
81.0000 mg | DELAYED_RELEASE_TABLET | Freq: Every day | ORAL | Status: DC
  Administered 2017-07-28: 81 mg via ORAL
  Filled 2017-07-28: qty 1

## 2017-07-28 MED ORDER — ATORVASTATIN CALCIUM 80 MG PO TABS
80.0000 mg | ORAL_TABLET | Freq: Every day | ORAL | Status: DC
  Administered 2017-07-28: 80 mg via ORAL
  Filled 2017-07-28: qty 1

## 2017-07-28 MED ORDER — ALBUTEROL SULFATE HFA 108 (90 BASE) MCG/ACT IN AERS: 2.0000 | INHALATION_SPRAY | Freq: Four times a day (QID) | RESPIRATORY_TRACT | Status: DC | PRN

## 2017-07-28 MED ORDER — NITROGLYCERIN 0.4 MG SL SUBL
0.4000 mg | SUBLINGUAL_TABLET | SUBLINGUAL | Status: AC | PRN
  Administered 2017-07-28 (×3): 0.4 mg via SUBLINGUAL
  Filled 2017-07-28: qty 1

## 2017-07-28 MED ORDER — SPIRONOLACTONE 25 MG PO TABS
25.0000 mg | ORAL_TABLET | Freq: Every day | ORAL | Status: DC
  Administered 2017-07-28: 25 mg via ORAL
  Filled 2017-07-28: qty 1

## 2017-07-28 MED ORDER — TORSEMIDE 20 MG PO TABS
40.0000 mg | ORAL_TABLET | Freq: Every day | ORAL | Status: DC
  Administered 2017-07-28: 40 mg via ORAL
  Filled 2017-07-28: qty 2

## 2017-07-28 MED ORDER — INSULIN ASPART 100 UNIT/ML ~~LOC~~ SOLN
0.0000 [IU] | Freq: Three times a day (TID) | SUBCUTANEOUS | Status: DC
  Administered 2017-07-28: 1 [IU] via SUBCUTANEOUS
  Administered 2017-07-28: 3 [IU] via SUBCUTANEOUS

## 2017-07-28 MED ORDER — PANTOPRAZOLE SODIUM 40 MG PO TBEC: 40.0000 mg | DELAYED_RELEASE_TABLET | Freq: Every day | ORAL | 0 refills | Status: DC

## 2017-07-28 MED ORDER — STUDY - INVESTIGATIONAL MEDICATION
1.0000 | Freq: Two times a day (BID) | Status: DC
Start: 2017-07-28 — End: 2017-07-28

## 2017-07-28 MED ORDER — PANTOPRAZOLE SODIUM 40 MG PO TBEC
40.0000 mg | DELAYED_RELEASE_TABLET | Freq: Every day | ORAL | Status: DC
  Administered 2017-07-28: 40 mg via ORAL
  Filled 2017-07-28: qty 1

## 2017-07-28 MED ORDER — ONDANSETRON HCL 4 MG/2ML IJ SOLN: 4.0000 mg | Freq: Four times a day (QID) | INTRAMUSCULAR | Status: DC | PRN

## 2017-07-28 MED ORDER — MORPHINE SULFATE (PF) 4 MG/ML IV SOLN
4.0000 mg | Freq: Once | INTRAVENOUS | Status: AC
  Administered 2017-07-28: 4 mg via INTRAVENOUS
  Filled 2017-07-28: qty 1

## 2017-07-28 MED ORDER — ISOSORB DINITRATE-HYDRALAZINE 20-37.5 MG PO TABS
1.0000 | ORAL_TABLET | Freq: Three times a day (TID) | ORAL | Status: DC
  Administered 2017-07-28: 1 via ORAL
  Filled 2017-07-28: qty 1

## 2017-07-28 MED ORDER — ACETAMINOPHEN 325 MG PO TABS: 650.0000 mg | ORAL_TABLET | ORAL | Status: DC | PRN

## 2017-07-28 NOTE — ED Provider Notes (Signed)
TIME SEEN: 2:43 AM  CHIEF COMPLAINT: Chest pain  HPI: Patient is a 61 year old male with history of significant coronary artery disease status post CABG and MI, CHF, hypertension, hyperlipidemia, diabetes who presents to the emergency department with chest pain that started at rest between 9 and 10 PM. States that he had tightness, pressure in his chest that he describes as "severe" and "hard". States he was diaphoretic and had nausea and dizziness. No shortness of breath. States he took 325 mg of aspirin at 6 PM with his regular nighttime medications. He does state that his symptoms feel similar to his prior heart attack. Was just admitted to the hospital for CHF exacerbation. No lower extremity swelling or pain. He does not feel like this is like his CHF.  Cath April 2018:  Left Main  30% stenosis.  Left Anterior Descending  90% mid LAD stenosis. Patent LIMA-LAD.  Left Circumflex  Moderate high OM1 with luminal irregularities. Small caliber distal LCx with moderate diffuse disease. 70-80% stenosis proximally in moderate OM2. Patent SVG-OM2.  Right Coronary Artery  30% proximal and mid RCA stenoses.   Echo July 2018:  Study Conclusions  - Left ventricle: The cavity size was moderately dilated. Wall   thickness was increased in a pattern of mild LVH. Systolic   function was severely reduced. The estimated ejection fraction   was in the range of 20% to 25%. Diffuse hypokinesis. There is   akinesis of the inferior myocardium. - Ventricular septum: Septal motion showed abnormal function and   dyssynergy. - Aortic valve: There was moderate regurgitation. - Aorta: Aortic root dimension: 39 mm (ED). - Ascending aorta: The ascending aorta was mildly dilated. - Mitral valve: There was mild regurgitation. Valve area by   pressure half-time: 1.35 cm^2. - Left atrium: The atrium was moderately dilated. - Pulmonary arteries: Systolic pressure was moderately to severely   increased. PA peak  pressure: 61 mm Hg (S).     ROS: See HPI Constitutional: no fever  Eyes: no drainage  ENT: no runny nose   Cardiovascular:   chest pain  Resp: no SOB  GI: no vomiting GU: no dysuria Integumentary: no rash  Allergy: no hives  Musculoskeletal: no leg swelling  Neurological: no slurred speech ROS otherwise negative  PAST MEDICAL HISTORY/PAST SURGICAL HISTORY:  Past Medical History:  Diagnosis Date  . Angina at rest [facility]) 07/08/2016  . CAD (coronary artery disease) 2010   3v CABG  . Chest pain at rest 07/08/2016  . CHF (congestive heart failure) ([place]) 01/26/2012  . CKD (chronic kidney disease) 02/19/2012  . COPD with chronic bronchitis ([place]) 08/26/2016  . Diabetes mellitus type 2, uncontrolled ([place]) 01/26/2012  . Elevated liver function tests 01/26/2012  . Gout 01/26/2012  . Hx of CABG 2010   RCA Stent 2008, CABG x 2 [facility] 2010  . Hypercholesterolemia 07/08/2016  . Hypertension 09/04/2014  . Ischemic cardiomyopathy 11/15/2014  . Kidney stone 10/25/2015  . Lipid disorder 01/26/2012  . NSTEMI (non-ST elevated myocardial infarction) ([place]) 07/08/2016  . ST elevation myocardial infarction (STEMI) ([place]) 2010  . Unstable angina ([place]) 11/14/2014    MEDICATIONS:  Prior to Admission medications   Medication Sig Start Date End Date Taking? Authorizing Provider  albuterol (PROVENTIL HFA;VENTOLIN HFA) 108 (90 Base) MCG/ACT inhaler Inhale 2 puffs into the lungs every 6 (six) hours as needed for wheezing or shortness of breath.    [provider]  albuterol (PROVENTIL) (2.5 MG/3ML) 0.083% nebulizer solution Take 3 mLs (2.5 mg  total) by nebulization every 6 (six) hours as needed for wheezing or shortness of breath. 08/26/16   Shelda Pal, DO  allopurinol (ZYLOPRIM) 300 MG tablet TAKE 1 TABLET BY MOUTH TWICE A DAY 07/12/17   Shelda Pal, DO  aspirin EC 81 MG EC tablet Take 1 tablet (81 mg total) by mouth daily. 04/16/17   Clegg, Amy D, NP  atorvastatin  (LIPITOR) 80 MG tablet Take 1 tablet (80 mg total) by mouth daily. 08/26/16   Shelda Pal, DO  carvedilol (COREG) 3.125 MG tablet Take 1 tablet (3.125 mg total) by mouth 2 (two) times daily. 04/26/17   Shirley Friar, PA-C  digoxin (LANOXIN) 0.125 MG tablet Take 1 tablet (0.125 mg total) by mouth daily. 04/16/17   Clegg, Amy D, NP  glucose blood test strip Use as instructed 08/26/16   Shelda Pal, DO  Investigational - Study Medication Take 1 tablet by mouth 2 (two) times daily. Study name: Galactic HF Study Additional study details: Omecamtiv Mecarbil or Placebo 04/15/17   Larey Dresser, MD  isosorbide-hydrALAZINE (BIDIL) 20-37.5 MG tablet Take 0.5 tablets by mouth 3 (three) times daily. 07/14/17   Lavina Hamman, MD  Lancets (FREESTYLE) lancets Use to check sugars daily 08/26/16   Shelda Pal, DO  metFORMIN (GLUCOPHAGE) 500 MG tablet Week 1: 1 tab daily Week 2: 1 tab twice daily Week 3: 2 tabs in AM, 1 in evening Week 4: 2 tabs twice daily Patient taking differently: Take 500 mg by mouth 2 (two) times daily.  01/07/17   Wendling, Crosby Oyster, DO  sacubitril-valsartan (ENTRESTO) 24-26 MG Take 1 tablet by mouth 2 (two) times daily. 04/15/17   Clegg, Amy D, NP  spironolactone (ALDACTONE) 25 MG tablet Take 25 mg by mouth daily.    [provider]  torsemide (DEMADEX) 20 MG tablet Take 2 tablets (40 mg total) by mouth daily. 07/15/17   Lavina Hamman, MD    ALLERGIES:  Allergies  Allergen Reactions  . Hydrocodone Itching  . Tramadol Nausea Only    SOCIAL HISTORY:  Social History  Substance Use Topics  . Smoking status: Former Smoker    Packs/day: 0.50    Years: 30.00    Quit date: 07/10/2016  . Smokeless tobacco: Never Used  . Alcohol use No    FAMILY HISTORY: Family History  Problem Relation Age of Onset  . Hypertension Mother   . Diabetes Mother   . Hyperlipidemia Mother   . Kidney failure Mother   . Emphysema Father      EXAM: BP (!) 163/106 (BP Location: Left Arm)   Pulse 77   Temp (!) 97.5 F (36.4 C) (Oral)   Resp (!) 22   Ht 5\' 10"  (1.778 m)   Wt 90.7 kg (200 lb)   SpO2 100%   BMI 28.70 kg/m  CONSTITUTIONAL: Alert and oriented and responds appropriately to questions. Chronically ill-appearing, in no significant distress, afebrile HEAD: Normocephalic EYES: Conjunctivae clear, pupils appear equal, EOMI ENT: normal nose; moist mucous membranes NECK: Supple, no meningismus, no nuchal rigidity, no LAD  CARD: RRR; S1 and S2 appreciated; no murmurs, no clicks, no rubs, no gallops RESP: Normal chest excursion without splinting or tachypnea; breath sounds clear and equal bilaterally; no wheezes, no rhonchi, no rales, no hypoxia or respiratory distress, speaking full sentences ABD/GI: Normal bowel sounds; non-distended; soft, non-tender, no rebound, no guarding, no peritoneal signs, no hepatosplenomegaly BACK:  The back appears normal and is non-tender to  palpation, there is no CVA tenderness EXT: Normal ROM in all joints; non-tender to palpation; no edema; normal capillary refill; no cyanosis, no calf tenderness or swelling    SKIN: Normal color for age and race; warm; no rash NEURO: Moves all extremities equally PSYCH: The patient's mood and manner are appropriate. Grooming and personal hygiene are appropriate.  MEDICAL DECISION MAKING: Patient here with complaints of pain. Has significant coronary artery disease history and CHF. Does not appear volume overloaded. He is not tachycardic, tachypneic or hypoxic. I do not think this is a pulmonary embolus. It is not pleuritic in nature. Will obtain cardiac labs, chest x-ray. EKG shows left bundle branch block with no new changes. We'll give nitroglycerin.  ED PROGRESS: Pain improved but not completely gone nitroglycerin. Will give dose of IV morphine. Patient has chronic kidney disease which is stable. He has an elevated troponin which is also stable from  his previous. Chest x-ray shows cardiomegaly with vascular congestion but no overt pulmonary edema. No infiltrate. No pneumothorax. Have recommended admission for ACS rule out given his significant cardiac history. His last cardiac catheterization in April revealed 90% LAD lesion and 80% obtuse marginal lesion.   3:44 AM Discussed patient's case with hospitalist, Dr. Hal Hope.  I have recommended admission and patient (and family if present) agree with this plan. Admitting physician will place admission orders. Will transfer patient to [facility].  I reviewed all nursing notes, vitals, pertinent previous records, EKGs, lab and urine results, imaging (as available).     EKG Interpretation  Date/Time:  Wednesday July 28 2017 02:28:42 EDT Ventricular Rate:  78 PR Interval:  184 QRS Duration: 176 QT Interval:  450 QTC Calculation: 513 R Axis:   -12 Text Interpretation:  Normal sinus rhythm Possible Left atrial enlargement Non-specific intra-ventricular conduction block Abnormal ECG No significant change since last tracing Confirmed by Alin Chavira, Cyril Mourning [phone]) on 07/28/2017 2:32:58 AM         Judd Mccubbin, Delice Bison, DO 07/28/17 5102

## 2017-07-28 NOTE — Consult Note (Signed)
Advanced Heart Failure Team Consult Note   Primary Physician: Primary HF Cardiologist:  Dr Aundra Dubin   Reason for Consultation: Chest Pain/Heart Failure  HPI:    Patrick Stewart is seen today for evaluation of heart failure at the request of Dr Marily Memos.    Patrick Stewart is a 61 year old with history of ICM, CAD S/P CAB x2 2010 SVG-->OM1 and LIMA -->LAD, COPD, gout, DMII, smoker, HTN, LBBB, and chronic systolic heart failure. He is on Galactic -HF Study med. admitted wih CP and dyspnea.   Admitted the end of July with A/C HF. Diuresed with IV lasix and transitioned to torsemide 40 mg daily. Discharged on entresto, spiro, bidil, dig, coreg, asa, statin, and galactic study drug.  Discharge weight 200 pounds.   Over the last week he had no complaints Last night he was resting and had chest pain 9/10 associated with nausea, dizziness, and diaphoresis. He took aspirin and went to ED. He said that this was the same as the chest pain he had in 2010. Taking all medication and reports compliance. Weight has been stable at home. He follows low salt diet but had hot dogs last night with chili sauce and onions last night. He felt symptoms might have been related to reflux but wanted to get checked out. He recently stopped PPI.   He presented to [facility] with chest pain. He was given NTG and Morphine.CXR without evidence of edema.  Today he is feeling much better with slight chest discomfort. Denies SOB/Orthopnea.  Pertinent admission labs include BNP 122 (recent BNP at last admit over 1,000), Hgb 14.2, K 4.7, creatinine  Troponin 0.15>0.16 .   ECHO 06/2016 EF 20-25%   03/2017 RHC/LHC   RA 9 RV 53/11 PA 52/17, mean 35 PCWP mean 14 LV 133/30 AO 136/86 Oxygen saturations: PA 60% AO 97% Cardiac Output (Fick) 4.12  Cardiac Index (Fick) 1.94 Left Main  30% stenosis.  Left Anterior Descending  90% mid LAD stenosis. Patent LIMA-LAD.  Left Circumflex  Moderate high OM1 with luminal irregularities. Small  caliber distal LCx with moderate diffuse disease. 70-80% stenosis proximally in moderate OM2. Patent SVG-OM2.  Right Coronary Artery  30% proximal and mid RCA stenoses.      Review of Systems: [y] = yes, [ ]  = no   General: Weight gain [ ] ; Weight loss [ ] ; Anorexia [ ] ; Fatigue [ ] ; Fever [ ] ; Chills [ ] ; Weakness [ ]   Cardiac: Chest pain/pressure [Y ]; Resting SOB [ ] ; Exertional SOB [ ] ; Orthopnea [ ] ; Pedal Edema [ ] ; Palpitations [ ] ; Syncope [ ] ; Presyncope [ ] ; Paroxysmal nocturnal dyspnea[ ]   Pulmonary: Cough [ ] ; Wheezing[ ] ; Hemoptysis[ ] ; Sputum [ ] ; Snoring [ ]   GI: Vomiting[ ] ; Dysphagia[ ] ; Melena[ ] ; Hematochezia [ ] ; Heartburn[ ] ; Abdominal pain [ ] ; Constipation [ ] ; Diarrhea [ ] ; BRBPR [ ]   GU: Hematuria[ ] ; Dysuria [ ] ; Nocturia[ ]   Vascular: Pain in legs with walking [ ] ; Pain in feet with lying flat [ ] ; Non-healing sores [ ] ; Stroke [ ] ; TIA [ ] ; Slurred speech [ ] ;  Neuro: Headaches[ ] ; Vertigo[ ] ; Seizures[ ] ; Paresthesias[ ] ;Blurred vision [ ] ; Diplopia [ ] ; Vision changes [ ]   Ortho/Skin: Arthritis [ ] ; Joint pain [Y ]; Muscle pain [ ] ; Joint swelling [ ] ; Back Pain [ ] ; Rash [ ]   Psych: Depression[ ] ; Anxiety[ ]   Heme: Bleeding problems [ ] ; Clotting disorders [ ] ; Anemia [ ]   Endocrine: Diabetes [Y ]; Thyroid dysfunction[ ]   Home Medications Prior to Admission medications   Medication Sig Start Date End Date Taking? Authorizing Provider  albuterol (PROVENTIL HFA;VENTOLIN HFA) 108 (90 Base) MCG/ACT inhaler Inhale 2 puffs into the lungs every 6 (six) hours as needed for wheezing or shortness of breath.   Yes [provider]  albuterol (PROVENTIL) (2.5 MG/3ML) 0.083% nebulizer solution Take 3 mLs (2.5 mg total) by nebulization every 6 (six) hours as needed for wheezing or shortness of breath. 08/26/16  Yes Shelda Pal, DO  allopurinol (ZYLOPRIM) 300 MG tablet TAKE 1 TABLET BY MOUTH TWICE A DAY Patient taking differently: TAKE 300 MG BY MOUTH  TWICE A DAY 07/12/17  Yes Shelda Pal, DO  aspirin EC 81 MG EC tablet Take 1 tablet (81 mg total) by mouth daily. 04/16/17  Yes Clegg, Amy D, NP  atorvastatin (LIPITOR) 80 MG tablet Take 1 tablet (80 mg total) by mouth daily. 08/26/16  Yes Shelda Pal, DO  carvedilol (COREG) 3.125 MG tablet Take 1 tablet (3.125 mg total) by mouth 2 (two) times daily. 04/26/17  Yes Shirley Friar, PA-C  digoxin (LANOXIN) 0.125 MG tablet Take 1 tablet (0.125 mg total) by mouth daily. 04/16/17  Yes Clegg, Amy D, NP  Investigational - Study Medication Take 1 tablet by mouth 2 (two) times daily. Study name: Galactic HF Study Additional study details: Omecamtiv Mecarbil or Placebo 04/15/17  Yes Larey Dresser, MD  isosorbide-hydrALAZINE (BIDIL) 20-37.5 MG tablet Take 0.5 tablets by mouth 3 (three) times daily. 07/14/17  Yes Lavina Hamman, MD  metFORMIN (GLUCOPHAGE) 500 MG tablet Week 1: 1 tab daily Week 2: 1 tab twice daily Week 3: 2 tabs in AM, 1 in evening Week 4: 2 tabs twice daily Patient taking differently: Take 500 mg by mouth 2 (two) times daily.  01/07/17  Yes Wendling, Crosby Oyster, DO  sacubitril-valsartan (ENTRESTO) 24-26 MG Take 1 tablet by mouth 2 (two) times daily. 04/15/17  Yes Clegg, Amy D, NP  spironolactone (ALDACTONE) 25 MG tablet Take 25 mg by mouth daily.   Yes [provider]  torsemide (DEMADEX) 20 MG tablet Take 2 tablets (40 mg total) by mouth daily. 07/15/17  Yes Lavina Hamman, MD  glucose blood test strip Use as instructed Patient not taking: Reported on 07/28/2017 08/26/16   Shelda Pal, DO  Lancets (FREESTYLE) lancets Use to check sugars daily Patient not taking: Reported on 07/28/2017 08/26/16   Shelda Pal, DO    Past Medical History: Past Medical History:  Diagnosis Date  . Angina at rest [facility]) 07/08/2016  . CAD (coronary artery disease) 2010   3v CABG  . Chest pain at rest 07/08/2016  . CHF (congestive heart failure) ([place])  01/26/2012  . CKD (chronic kidney disease) 02/19/2012  . COPD with chronic bronchitis ([place]) 08/26/2016  . Diabetes mellitus type 2, uncontrolled ([place]) 01/26/2012  . Elevated liver function tests 01/26/2012  . Gout 01/26/2012  . Hx of CABG 2010   RCA Stent 2008, CABG x 2 [facility] 2010  . Hypercholesterolemia 07/08/2016  . Hypertension 09/04/2014  . Ischemic cardiomyopathy 11/15/2014  . Kidney stone 10/25/2015  . Lipid disorder 01/26/2012  . NSTEMI (non-ST elevated myocardial infarction) ([place]) 07/08/2016  . ST elevation myocardial infarction (STEMI) ([place]) 2010  . Unstable angina ([place]) 11/14/2014    Past Surgical History: Past Surgical History:  Procedure Laterality Date  . CORONARY ARTERY BYPASS GRAFT  2010  . KNEE SURGERY    .  RIGHT/LEFT HEART CATH AND CORONARY ANGIOGRAPHY N/A 04/15/2017   Procedure: Right/Left Heart Cath and Coronary Angiography;  Surgeon: Larey Dresser, MD;  Location: [facility];  Service: Cardiovascular;  Laterality: N/A;  . ROTATOR CUFF REPAIR    . WRIST SURGERY      Family History: Family History  Problem Relation Age of Onset  . Hypertension Mother   . Diabetes Mother   . Hyperlipidemia Mother   . Kidney failure Mother   . Emphysema Father     Social History: Social History   Social History  . Marital status: Married    Spouse name: N/A  . Number of children: N/A  . Years of education: N/A   Occupational History  . disabled    Social History Main Topics  . Smoking status: Former Smoker    Packs/day: 0.50    Years: 30.00    Quit date: 07/10/2016  . Smokeless tobacco: Never Used  . Alcohol use No  . Drug use: No  . Sexual activity: Yes   Other Topics Concern  . None   Social History Narrative  . None    Allergies:  Allergies  Allergen Reactions  . Hydrocodone Itching  . Tramadol Nausea Only    Objective:    Vital Signs:   Temp:  [97.5 F (36.4 C)-98 F (36.7 C)] 97.9 F (36.6 C) (08/01 0755) Pulse Rate:  [76-103]  76 (08/01 0956) Resp:  [16-27] 17 (08/01 0652) BP: (136-164)/(81-106) 136/99 (08/01 0755) SpO2:  [98 %-100 %] 100 % (08/01 0652) Weight:  [200 lb (90.7 kg)] 200 lb (90.7 kg) (08/01 0228) Last BM Date: 07/27/17  Weight change: Filed Weights   07/28/17 0228  Weight: 200 lb (90.7 kg)    Intake/Output:  No intake or output data in the 24 hours ending 07/28/17 1023    Physical Exam    General:  Well appearing. No resp difficulty. In bed  HEENT: normal Neck: supple. JVP 5-6  . Carotids 2+ bilat; no bruits. No lymphadenopathy or thyromegaly appreciated. Cor: PMI nondisplaced. Regular rate & rhythm. No rubs, gallops or murmurs. Lungs: clear Abdomen: soft, nontender, nondistended. No hepatosplenomegaly. No bruits or masses. Good bowel sounds. Extremities: no cyanosis, clubbing, rash, edema Neuro: alert & orientedx3, cranial nerves grossly intact. moves all 4 extremities w/o difficulty. Affect pleasant   Telemetry   NSR 70s   EKG    NSR 78 bpm LBBB QRS 176  Labs   Basic Metabolic Panel:  Recent Labs Lab 07/28/17 0233  NA 135  K 4.7  CL 96*  CO2 28  GLUCOSE 245*  BUN 39*  CREATININE 2.06*  CALCIUM 9.0    Liver Function Tests: No results for input(s): AST, ALT, ALKPHOS, BILITOT, PROT, ALBUMIN in the last 168 hours. No results for input(s): LIPASE, AMYLASE in the last 168 hours. No results for input(s): AMMONIA in the last 168 hours.  CBC:  Recent Labs Lab 07/28/17 0233  WBC 5.5  HGB 14.0  HCT 42.0  MCV 91.5  PLT 278    Cardiac Enzymes:  Recent Labs Lab 07/28/17 0233 07/28/17 0855  TROPONINI 0.15* 0.16*    BNP: BNP (last 3 results)  Recent Labs  05/17/17 1342 07/12/17 1400 07/28/17 0233  BNP 315.5* 1,152.4* 122.8*    ProBNP (last 3 results) No results for input(s): PROBNP in the last 8760 hours.   CBG: No results for input(s): GLUCAP in the last 168 hours.  Coagulation Studies: No results for input(s): LABPROT, INR in  the last 72  hours.   Imaging   Dg Chest 2 View  Result Date: 07/28/2017 CLINICAL DATA:  Initial evaluation for acute mid chest pain. EXAM: CHEST  2 VIEW COMPARISON:  Prior radiograph from 07/12/2017. FINDINGS: Median sternotomy wires underlying CABG markers and surgical clips noted. Moderate cardiomegaly, stable from previous. Mediastinal silhouette within normal limits. Lungs normally inflated. Perihilar vascular congestion without overt pulmonary edema. No pleural effusion. No focal infiltrates. No pneumothorax. No acute osseus abnormality. IMPRESSION: 1. Cardiomegaly with mild perihilar vascular congestion without overt pulmonary edema. 2. No other active cardiopulmonary disease. 3. Sequelae of prior CABG. Electronically Signed   By: Jeannine Boga M.D.   On: 07/28/2017 03:04      Medications:     Current Medications: . allopurinol  300 mg Oral BID  . aspirin EC  81 mg Oral Daily  . atorvastatin  80 mg Oral Daily  . carvedilol  3.125 mg Oral BID WC  . digoxin  0.125 mg Oral Daily  . heparin  5,000 Units Subcutaneous Q8H  . insulin aspart  0-9 Units Subcutaneous TID WC  . Investigational - Study Medication  1 tablet Oral BID  . isosorbide-hydrALAZINE  0.5 tablet Oral TID  . sacubitril-valsartan  1 tablet Oral BID  . spironolactone  25 mg Oral Daily  . torsemide  40 mg Oral Daily     Infusions:     Patient Profile   Patrick Stewart is a 61 year old with ICM, CAD S/P CABG x2 2010 SVG -->OM1 , LIMR-->LAD, COPD, DMII, gout, former smoker, HTN, and chronic systolic heart failure  admitted with chest pain.    Assessment/Plan   1. Chest Pain Resolved with SL NTG and Morphine. Troponin trend flat 0.15>0.16  LHC 03/2017 Stable coronary disease with patent grafts.  Will not need repeat cath. Suspect GERD. He was on ppi in the past. Will add protonix.  Continue aspirin, bb, statin.  2. Chronic Systolic Heart Failure -ICM,  ECHO 06/2017 EF 20-25%. NYHA II. From HF perspective he appears stable.  Continue current HF meds and increase bidil to 1 tab tid. .  3. CKD Stage III- creatinine baseline 1.7-1.9. Todays creatinine 2. Has not had recent NSAIDs 4. HTN - Elevated. Increase bidil to 1 tab tid.   Dr Haroldine Laws evaluated and agrees no plan for cath. Likely GI. Add Protonix ok to go home today.   We will set up follow up in HF clinic.  Length of Stay: 0  Darrick Grinder, NP  07/28/2017, 10:23 AM  Advanced Heart Failure Team Pager [phone] (M-F; 7a - 4p)  Please contact [facility] for night-coverage after hours (4p -7a ) and weekends on [email or web address]  Patient seen and examined with Darrick Grinder, NP. We discussed all aspects of the encounter. I agree with the assessment and plan as stated above.   Recent heart cath reviewed personally. At baseline he is doing quite well. HF has been stable NYHA II. Troponin is minimally elevated but this is his baseline. ECG with LBBB. Suspect symptoms related to GERD. Will restart PPI. Counseled on watching his diet more closely. Can prop head of bed up as needed. Can be d/c'd home today from our standpoint. D/w Hospitalist team.   Glori Bickers, MD  1:48 PM

## 2017-07-28 NOTE — ED Triage Notes (Signed)
Pt grabbing chest reports chest pain in the center of chest since 11pm. He thought it was gas pains so he drank coffee to belch. Unsuccessful. Diaphoretic. Pt reports hx of MI and recent admission for CHF. A/O denies SOB.

## 2017-07-28 NOTE — H&P (Signed)
History and Physical    Patrick Stewart EXB:[id number] DOB: 06/08/1956 DOA: 07/28/2017   PCP: Shelda Pal, DO   Patient coming from:  Home    Chief Complaint: Chest pain   HPI: Patrick Stewart is a 61 y.o. male with medical history significant for CAD s/p CABG, last cath in 03/2017 for 90 percent LAD with 80 percent obtuse marginal, ICM, CHF HTN, HLD, CKD,COPD, DM2, Gout, recently hospitalized from 7/16 for acute on chronic systolic CHF and acute hypoxic respiratory failure, managed at home with 4 meds and a research study medication or placebo, brought from [facility] for further evaluation of CP. HE presented with acute onset of chest pain at rest, starting around 9 pm, severe, accompanied by diaphoresis, nausea and dizziness, described as similar to prior MI. Took ASA 325 mg at home and NTG at the facility with some improvement of symptoms. Continues to be symptomatic . No syncope or presyncope. Reports some acute on chronic dyspnea, worse on exertion. Denies any cough. Denies any fever or chills. Denies any abdominal pain. Appetite is normal  Denies any leg swelling or calf pain. Denies any headaches or vision changes.  He was supposed to be seen by his cardiologist today Denies any new stressors. No new meds, and he is compliant.     ED Course:  BP (!) 136/99 (BP Location: Left Arm)   Pulse 88   Temp 97.9 F (36.6 C) (Oral)   Resp 17   Ht 5\' 10"  (1.778 m)   Wt 90.7 kg (200 lb)   SpO2 100%   BMI 28.70 kg/m    BNP 122.8  TN 0.15  Cath 03/2017  90 percent LAD with 80 percent obtuse marginal  Echo 06/2017 The estimated ejection fraction was in the range of 20% to 25%. Diffuse hypokinesis. There is akinesis of the inferior myocardium.severely reduced systolic function  TTE (12/29/70) with EF 10-15%, severely dilated LV, mild LVH, diffuse HK, moderate AR, moderate LAE, and moderate elevation in PA pressures  Cr 2.06  Glu 245  CBC normal   Review of Systems: As per HPI otherwise 10  point review of systems negative.   Past Medical History:  Diagnosis Date  . Angina at rest [facility]) 07/08/2016  . CAD (coronary artery disease) 2010   3v CABG  . Chest pain at rest 07/08/2016  . CHF (congestive heart failure) ([place]) 01/26/2012  . CKD (chronic kidney disease) 02/19/2012  . COPD with chronic bronchitis ([place]) 08/26/2016  . Diabetes mellitus type 2, uncontrolled ([place]) 01/26/2012  . Elevated liver function tests 01/26/2012  . Gout 01/26/2012  . Hx of CABG 2010   RCA Stent 2008, CABG x 2 [facility] 2010  . Hypercholesterolemia 07/08/2016  . Hypertension 09/04/2014  . Ischemic cardiomyopathy 11/15/2014  . Kidney stone 10/25/2015  . Lipid disorder 01/26/2012  . NSTEMI (non-ST elevated myocardial infarction) ([place]) 07/08/2016  . ST elevation myocardial infarction (STEMI) ([place]) 2010  . Unstable angina ([place]) 11/14/2014    Past Surgical History:  Procedure Laterality Date  . CORONARY ARTERY BYPASS GRAFT  2010  . KNEE SURGERY    . RIGHT/LEFT HEART CATH AND CORONARY ANGIOGRAPHY N/A 04/15/2017   Procedure: Right/Left Heart Cath and Coronary Angiography;  Surgeon: Larey Dresser, MD;  Location: [place] [facility];  Service: Cardiovascular;  Laterality: N/A;  . ROTATOR CUFF REPAIR    . WRIST SURGERY      Social History Social History   Social History  . Marital status: Married  Spouse name: N/A  . Number of children: N/A  . Years of education: N/A   Occupational History  . disabled    Social History Main Topics  . Smoking status: Former Smoker    Packs/day: 0.50    Years: 30.00    Quit date: 07/10/2016  . Smokeless tobacco: Never Used  . Alcohol use No  . Drug use: No  . Sexual activity: Yes   Other Topics Concern  . Not on file   Social History Narrative  . No narrative on file     Allergies  Allergen Reactions  . Hydrocodone Itching  . Tramadol Nausea Only    Family History  Problem Relation Age of Onset  . Hypertension Mother   . Diabetes Mother     . Hyperlipidemia Mother   . Kidney failure Mother   . Emphysema Father       Prior to Admission medications   Medication Sig Start Date End Date Taking? Authorizing Provider  albuterol (PROVENTIL HFA;VENTOLIN HFA) 108 (90 Base) MCG/ACT inhaler Inhale 2 puffs into the lungs every 6 (six) hours as needed for wheezing or shortness of breath.    [provider]  albuterol (PROVENTIL) (2.5 MG/3ML) 0.083% nebulizer solution Take 3 mLs (2.5 mg total) by nebulization every 6 (six) hours as needed for wheezing or shortness of breath. 08/26/16   Shelda Pal, DO  allopurinol (ZYLOPRIM) 300 MG tablet TAKE 1 TABLET BY MOUTH TWICE A DAY 07/12/17   Shelda Pal, DO  aspirin EC 81 MG EC tablet Take 1 tablet (81 mg total) by mouth daily. 04/16/17   Clegg, Amy D, NP  atorvastatin (LIPITOR) 80 MG tablet Take 1 tablet (80 mg total) by mouth daily. 08/26/16   Shelda Pal, DO  carvedilol (COREG) 3.125 MG tablet Take 1 tablet (3.125 mg total) by mouth 2 (two) times daily. 04/26/17   Shirley Friar, PA-C  digoxin (LANOXIN) 0.125 MG tablet Take 1 tablet (0.125 mg total) by mouth daily. 04/16/17   Clegg, Amy D, NP  glucose blood test strip Use as instructed 08/26/16   Shelda Pal, DO  Investigational - Study Medication Take 1 tablet by mouth 2 (two) times daily. Study name: Galactic HF Study Additional study details: Omecamtiv Mecarbil or Placebo 04/15/17   Larey Dresser, MD  isosorbide-hydrALAZINE (BIDIL) 20-37.5 MG tablet Take 0.5 tablets by mouth 3 (three) times daily. 07/14/17   Lavina Hamman, MD  Lancets (FREESTYLE) lancets Use to check sugars daily 08/26/16   Shelda Pal, DO  metFORMIN (GLUCOPHAGE) 500 MG tablet Week 1: 1 tab daily Week 2: 1 tab twice daily Week 3: 2 tabs in AM, 1 in evening Week 4: 2 tabs twice daily Patient taking differently: Take 500 mg by mouth 2 (two) times daily.  01/07/17   Wendling, Crosby Oyster, DO   sacubitril-valsartan (ENTRESTO) 24-26 MG Take 1 tablet by mouth 2 (two) times daily. 04/15/17   Clegg, Amy D, NP  spironolactone (ALDACTONE) 25 MG tablet Take 25 mg by mouth daily.    [provider]  torsemide (DEMADEX) 20 MG tablet Take 2 tablets (40 mg total) by mouth daily. 07/15/17   Lavina Hamman, MD    Physical Exam:  Vitals:   07/28/17 0530 07/28/17 0600 07/28/17 0652 07/28/17 0755  BP: (!) 161/104 (!) 153/97 (!) 149/100 (!) 136/99  Pulse: 83 (!) 103 88   Resp: (!) 21 (!) 23 17   Temp:   98 F (36.7  C) 97.9 F (36.6 C)  TempSrc:   Oral Oral  SpO2: 100% 99% 100%   Weight:      Height:       Constitutional: NAD, calm, uncomfortable appearing  Eyes: PERRL, lids and conjunctivae normal ENMT: Mucous membranes are moist, without exudate or lesions  Neck: normal, supple, no masses, no thyromegaly Respiratory:  decreased breath sounds at the bases no wheezing, no crackles. Normal respiratory effort  Cardiovascular: Regular rate and rhythm, soft 1/6 systolic  murmurs, rubs or gallops. Trace bilateral lower extremity edema. 2+ pedal pulses. No carotid bruits.  Abdomen: Soft, non tender, No hepatosplenomegaly. Bowel sounds positive.  Musculoskeletal: no clubbing / cyanosis. Moves all extremities Skin: no jaundice, No lesions.  Neurologic: Sensation intact  Strength equal in all extremities Psychiatric:   Alert and oriented x 3. Normal mood.     Labs on Admission: I have personally reviewed following labs and imaging studies  CBC:  Recent Labs Lab 07/28/17 0233  WBC 5.5  HGB 14.0  HCT 42.0  MCV 91.5  PLT 756    Basic Metabolic Panel:  Recent Labs Lab 07/28/17 0233  NA 135  K 4.7  CL 96*  CO2 28  GLUCOSE 245*  BUN 39*  CREATININE 2.06*  CALCIUM 9.0    GFR: Estimated Creatinine Clearance: 42.7 mL/min (A) (by C-G formula based on SCr of 2.06 mg/dL (H)).  Liver Function Tests: No results for input(s): AST, ALT, ALKPHOS, BILITOT, PROT, ALBUMIN in  the last 168 hours. No results for input(s): LIPASE, AMYLASE in the last 168 hours. No results for input(s): AMMONIA in the last 168 hours.  Coagulation Profile: No results for input(s): INR, PROTIME in the last 168 hours.  Cardiac Enzymes:  Recent Labs Lab 07/28/17 0233  TROPONINI 0.15*    BNP (last 3 results) No results for input(s): PROBNP in the last 8760 hours.  HbA1C: No results for input(s): HGBA1C in the last 72 hours.  CBG: No results for input(s): GLUCAP in the last 168 hours.  Lipid Profile: No results for input(s): CHOL, HDL, LDLCALC, TRIG, CHOLHDL, LDLDIRECT in the last 72 hours.  Thyroid Function Tests: No results for input(s): TSH, T4TOTAL, FREET4, T3FREE, THYROIDAB in the last 72 hours.  Anemia Panel: No results for input(s): VITAMINB12, FOLATE, FERRITIN, TIBC, IRON, RETICCTPCT in the last 72 hours.  Urine analysis:    Component Value Date/Time   COLORURINE AMBER (A) 09/25/2016 1352   APPEARANCEUR CLOUDY (A) 09/25/2016 1352   LABSPEC 1.027 09/25/2016 1352   PHURINE 5.5 09/25/2016 1352   GLUCOSEU NEGATIVE 09/25/2016 1352   HGBUR LARGE (A) 09/25/2016 1352   BILIRUBINUR SMALL (A) 09/25/2016 1352   KETONESUR 15 (A) 09/25/2016 1352   PROTEINUR >300 (A) 09/25/2016 1352   UROBILINOGEN 1.0 10/12/2015 1830   NITRITE NEGATIVE 09/25/2016 1352   LEUKOCYTESUR NEGATIVE 09/25/2016 1352    Sepsis Labs: @LABRCNTIP (procalcitonin:4,lacticidven:4) )No results found for this or any previous visit (from the past 240 hour(s)).   Radiological Exams on Admission: Dg Chest 2 View  Result Date: 07/28/2017 CLINICAL DATA:  Initial evaluation for acute mid chest pain. EXAM: CHEST  2 VIEW COMPARISON:  Prior radiograph from 07/12/2017. FINDINGS: Median sternotomy wires underlying CABG markers and surgical clips noted. Moderate cardiomegaly, stable from previous. Mediastinal silhouette within normal limits. Lungs normally inflated. Perihilar vascular congestion without overt  pulmonary edema. No pleural effusion. No focal infiltrates. No pneumothorax. No acute osseus abnormality. IMPRESSION: 1. Cardiomegaly with mild perihilar vascular congestion without overt pulmonary edema. 2.  No other active cardiopulmonary disease. 3. Sequelae of prior CABG. Electronically Signed   By: Jeannine Boga M.D.   On: 07/28/2017 03:04    EKG: Independently reviewed.  Assessment/Plan Active Problems:   Chest pain   CKD (chronic kidney disease), stage III   Idiopathic gout of left knee   CAD Status post CABG 2 SVG to OM 1, LIMA to mid LAD:   Cardiomyopathy, ischemic, chronic systolic CHF   Benign prostatic hyperplasia with lower urinary tract symptoms   Chronic joint pain   History of tobacco abuse   COPD with chronic bronchitis (HCC)   Diabetes mellitus type 2, uncontrolled (HCC)   LBBB (left bundle branch block)   Elevated troponin     Chest pain syndrome/known CAD.  HEART score 5-6  Troponin 0.15 , EKG SR without significant changes . CPsomewhat relieved by nitroglycerin and Aspirin, but not resolved . CXR unrevealing. Cath 03/2017  90 percent LAD with 80 percent obtuse marginal.   Admit to Telemetry/ Observation Chest pain order set Cycle troponins Cycle EKG  continue ASA, O2 and NTG as needed Continue preadmission meds Statins  GI cocktail Check Lipid panel  Hb A1C Consult to Cards, patient will be kept NPO with sips for meds in case that cardiac catheterization is indicated as he is still symptomatic  ICM/ Chronic systolic Congestive heart failure, appears compensated at this time. Recent hospitalization for same   CXR NAD  BNP 123  Echo 06/2017 The estimated ejection fraction was in the range of 20% to 25%. Diffuse hypokinesis. There is akinesis of the inferior myocardium.severely reduced systolic function  Osats normal in RA.  Weight 200 lb . Patient on several meds and in experimental drug   Telemetry   Daily weights and strict I/O Other plans as per  Cards   Chronic kidney disease stage baseline creatinine    Current Cr 3 BL 1.7, current 2 ,06  Lab Results  Component Value Date   CREATININE 2.06 (H) 07/28/2017   CREATININE 1.71 (H) 07/14/2017   CREATININE 1.52 (H) 07/13/2017  Hold diuretics if Cr continues to worsen, will monitor closely  Repeat CMET in am     Hypertension BP 136/99 Pulse 88  . Has not taken yet his morning meds  Controlled Continue home anti-hypertensive medications    Hyperlipidemia Continue home statins CHeck lipid panel   Type II Diabetes Current blood sugar level is 245 Lab Results  Component Value Date   HGBA1C 7.8 (H) 04/12/2017  Hgb A1C Hold home oral diabetic medications.  SSI   GERD, no acute symptoms Continue PPI  Gout, no acute flare Continue Allopurinol  COPD without exacerbation Osats  Nor mal in RA  CXR NAD   WBC normal  Continue nebs, inhalers and  O2 prn    DVT prophylaxis:   Heparin sq  Code Status:   Full      Family Communication:  Discussed with patient Disposition Plan: Expect patient to be discharged to home after condition improves Consults called:    Cardiology  Admission status:Tele  Obs      [facility] E, PA-C [facility]   07/28/2017, 8:24 AM

## 2017-07-28 NOTE — Care Management Note (Signed)
Case Management Note  Patient Details  Name: Patrick Stewart MRN: [id number] Date of Birth: [birth year]  Subjective/Objective:  Pt admitted with CP                  Action/Plan:   PTA from home independent - pt states he lives alone.  Pt states he will catch the bus in front of [facility] at discharge.  Pt confirmed he has PCP and denied barriers to obtaining medications as prescribed including recently prescribed Bidil (per pt insurance now covers it).  CM will continue to follow for discharge needs   Expected Discharge Date:                  Expected Discharge Plan:  Home/Self Care  In-House Referral:     Discharge planning Services  CM Consult  Post Acute Care Choice:    Choice offered to:     DME Arranged:    DME Agency:     HH Arranged:    HH Agency:     Status of Service:  Completed, signed off  If discussed at [employer] of Stay Meetings, dates discussed:    Additional Comments:  Maryclare Labrador, RN 07/28/2017, 4:03 PM

## 2017-07-28 NOTE — Discharge Instructions (Signed)
You were admitted and monitored for a cardiac cause of your chest pain. Fortunately there is no evidence that you are having a heart attack. Please follow up with your cardiac team as instructed.

## 2017-07-28 NOTE — Progress Notes (Signed)
Patient educated on discharge materials including new Protonix prescription, upcoming doctor appointments, and who to call if an emergency arises.  Belongings were gathered and taken with patient.  A taxi voucher was obtained for this patient. Patient was escorted out to the emergency entrance by RN.

## 2017-07-28 NOTE — Discharge Summary (Signed)
Physician Discharge Summary  Patrick Stewart NWG:[id number] DOB: 1956/12/01 DOA: 07/28/2017  PCP: Shelda Pal, DO  Admit date: 07/28/2017 Discharge date: 07/28/2017  Admitted From:  Disposition:  Recommendations for Outpatient Follow-up:  1. Follow up with PCP in 1-2 weeks 2. Please obtain BMP/CBC in one week 3. Please follow up on the following pending results:  Home Health: as per Care management for meds needs  Equipment/Devices: None   Discharge Condition: Stable,  CODE STATUS: FUL  Diet recommendation: Low salt  Brief/Interim Summary  HPI: Patrick Stewart is a 61 y.o. male with medical history significant for CAD s/p CABG, last cath in 03/2017 for 90 percent LAD with 80 percent obtuse marginal, ICM, CHF HTN, HLD, CKD,COPD, DM2, Gout, recently hospitalized from 7/16 for acute on chronic systolic CHF and acute hypoxic respiratory failure, managed at home with 4 meds and a research study medication or placebo, brought from [facility] for further evaluation of CP. HE presented with acute onset of chest pain at rest, starting around 9 pm, severe, accompanied by diaphoresis, nausea and dizziness, described as similar to prior MI. Took ASA 325 mg at home and NTG at the facility with some improvement of symptoms but continued to be symptomatic  No syncope or presyncope. Had  acute on chronic dyspnea, worse on exertion but no cough  Denies any fever or chills. Denies any abdominal pain. Appetite is normal  Denies any leg swelling or calf pain. Denies any headaches or vision changes.   No new meds, and he is compliant.   Cardiology consulted the patient Per chart note, he was felt to be clinically stable, at NYHA 2. TN was minimally elevated buit was his baseline. EKG with LBBB. It was suspected that symptoms were related to GERD, and PPI was restarted, diet was counseled by Cards. Dr. Missy Sabins discussed with [name] and was deemes stable for d/c anc can follow as outpatient  Discharge  Diagnoses:  Active Problems:   Chest pain   CKD (chronic kidney disease), stage III   Idiopathic gout of left knee   CAD Status post CABG 2 SVG to OM 1, LIMA to mid LAD:   Cardiomyopathy, ischemic, chronic systolic CHF   Benign prostatic hyperplasia with lower urinary tract symptoms   Chronic joint pain   History of tobacco abuse   COPD with chronic bronchitis (HCC)   Diabetes mellitus type 2, uncontrolled (HCC)   LBBB (left bundle branch block)   Elevated troponin   Diabetes mellitus with complication (HCC)   Chest pain syndrome/known CAD.  HEART score 5-6  Troponin 0.15 , EKG SR without significant changes . CP somewhat relieved by nitroglycerin and Aspirin, but  Initially not resolved . CXR unrevealing. Cath 03/2017  90 percent LAD with 80 percent obtuse marginal.Extensive w/u performed  Cardiology has seen patient and can be followed as OP, currently clinically stable as oer HPI     ICM/ Chronic systolic Congestive heart failure, appears compensated at this time. Recent hospitalization for same   CXR NAD  BNP 123  Echo 06/2017 The estimated ejection fraction was in the range of 20% to 25%. Diffuse hypokinesis. There is akinesis of the inferior myocardium.severely reduced systolic function  Osats normal in RA.  Weight 200 lb . Patient on several meds and in experimental drug   Telemetry   Daily weights and strict I/O To be f/u as OP by Cards   Chronic kidney disease stage baseline creatinine    Current Cr 3 BL 1.7, current  2 ,06  Recent Labs       Lab Results  Component Value Date   CREATININE 2.06 (H) 07/28/2017   CREATININE 1.71 (H) 07/14/2017   CREATININE 1.52 (H) 07/13/2017        Hypertension BP 136/99 Pulse 88    Continue home anti-hypertensive medications    Hyperlipidemia Continue home statins   Type II Diabetes Current blood sugar level  was 245 Recent Labs       Lab Results  Component Value Date   HGBA1C 7.8 (H) 04/12/2017    Continue  oral meds    GERD, no acute symptoms  PPI as per Cardiology   Gout, no acute flare Continue Allopurinol  COPD without exacerbation Osats  Normal in RA  CXR NAD   WBC normal  No intervention indicated    Discharge Instructions   Allergies as of 07/28/2017      Reactions   Hydrocodone Itching   Tramadol Nausea Only      Medication List    TAKE these medications   albuterol 108 (90 Base) MCG/ACT inhaler Commonly known as:  PROVENTIL HFA;VENTOLIN HFA Inhale 2 puffs into the lungs every 6 (six) hours as needed for wheezing or shortness of breath.   albuterol (2.5 MG/3ML) 0.083% nebulizer solution Commonly known as:  PROVENTIL Take 3 mLs (2.5 mg total) by nebulization every 6 (six) hours as needed for wheezing or shortness of breath.   allopurinol 300 MG tablet Commonly known as:  ZYLOPRIM TAKE 1 TABLET BY MOUTH TWICE A DAY What changed:  See the new instructions.   aspirin 81 MG EC tablet Take 1 tablet (81 mg total) by mouth daily.   atorvastatin 80 MG tablet Commonly known as:  LIPITOR Take 1 tablet (80 mg total) by mouth daily.   carvedilol 3.125 MG tablet Commonly known as:  COREG Take 1 tablet (3.125 mg total) by mouth 2 (two) times daily.   digoxin 0.125 MG tablet Commonly known as:  LANOXIN Take 1 tablet (0.125 mg total) by mouth daily.   freestyle lancets Use to check sugars daily   glucose blood test strip Use as instructed   Investigational - Study Medication Take 1 tablet by mouth 2 (two) times daily. Study name: Galactic HF Study Additional study details: Omecamtiv Mecarbil or Placebo   isosorbide-hydrALAZINE 20-37.5 MG tablet Commonly known as:  BIDIL Take 0.5 tablets by mouth 3 (three) times daily.   metFORMIN 500 MG tablet Commonly known as:  GLUCOPHAGE Week 1: 1 tab daily Week 2: 1 tab twice daily Week 3: 2 tabs in AM, 1 in evening Week 4: 2 tabs twice daily What changed:  how much to take  how to take this  when to take  this  additional instructions   sacubitril-valsartan 24-26 MG Commonly known as:  ENTRESTO Take 1 tablet by mouth 2 (two) times daily.   spironolactone 25 MG tablet Commonly known as:  ALDACTONE Take 25 mg by mouth daily.   torsemide 20 MG tablet Commonly known as:  DEMADEX Take 2 tablets (40 mg total) by mouth daily.      Follow-up Information    Larey Dresser, MD Follow up on 08/11/2017.   Specialty:  Cardiology Why:  at 3:00 [place] information: [place] [address] [phone]          Allergies  Allergen Reactions  . Hydrocodone Itching  . Tramadol Nausea Only    Consultations:   Physician/Group  Procedures/Studies: Dg Chest 2 View  Result Date: 07/28/2017 CLINICAL DATA:  Initial evaluation for acute mid chest pain. EXAM: CHEST  2 VIEW COMPARISON:  Prior radiograph from 07/12/2017. FINDINGS: Median sternotomy wires underlying CABG markers and surgical clips noted. Moderate cardiomegaly, stable from previous. Mediastinal silhouette within normal limits. Lungs normally inflated. Perihilar vascular congestion without overt pulmonary edema. No pleural effusion. No focal infiltrates. No pneumothorax. No acute osseus abnormality. IMPRESSION: 1. Cardiomegaly with mild perihilar vascular congestion without overt pulmonary edema. 2. No other active cardiopulmonary disease. 3. Sequelae of prior CABG. Electronically Signed   By: Jeannine Boga M.D.   On: 07/28/2017 03:04   Dg Chest 2 View  Result Date: 07/12/2017 CLINICAL DATA:  61 y/o M; shortness of breath worsening since Friday. EXAM: CHEST  2 VIEW COMPARISON:  04/11/2017 chest radiograph. FINDINGS: Stable moderate cardiomegaly post CABG. Sternotomy wires are aligned. Interstitial pulmonary edema. Chronic reticular markings of the lung peripheries probably represents mild fibrosis. No acute osseous abnormality is evident. Blunted right costal diaphragmatic angle.  IMPRESSION: 1. Mild interstitial pulmonary edema. 2. Stable moderate cardiomegaly. 3. Blunted right costal diaphragmatic angle, possible small effusion. Electronically Signed   By: Kristine Garbe M.D.   On: 07/12/2017 15:27    Discharge Exam: Vitals:   07/28/17 1128 07/28/17 1601  BP: (!) 128/94 133/88  Pulse:    Resp:    Temp: 98.1 F (36.7 C) 97.8 F (36.6 C)   Vitals:   07/28/17 0755 07/28/17 0956 07/28/17 1128 07/28/17 1601  BP: (!) 136/99  (!) 128/94 133/88  Pulse:  76    Resp:      Temp: 97.9 F (36.6 C)  98.1 F (36.7 C) 97.8 F (36.6 C)  TempSrc: Oral  Oral Oral  SpO2:      Weight:      Height:       Subjective: No complaints, he reports feeling better  General: Pt is alert, awake, not in acute distress Cardiovascular:Regular rate and rhythm, soft 1/6 systolic  murmurs, rubs or gallops. Trace bilateral lower extremity edema. 2+ pedal pulses. No carotid bruits.  Respiratory: CTA bilaterally, no wheezing, no rhonchi Abdominal: Soft, NT, ND, bowel sounds + Extremities: no edema, no cyanosis    The results of significant diagnostics from this hospitalization (including imaging, microbiology, ancillary and laboratory) are listed below for reference.     Microbiology: Recent Results (from the past 240 hour(s))  MRSA PCR Screening     Status: None   Collection Time: 07/28/17  6:57 AM  Result Value Ref Range Status   MRSA by PCR NEGATIVE NEGATIVE Final    Comment:        The GeneXpert MRSA Assay (FDA approved for NASAL specimens only), is one component of a comprehensive MRSA colonization surveillance program. It is not intended to diagnose MRSA infection nor to guide or monitor treatment for MRSA infections.      Labs: BNP (last 3 results)  Recent Labs  05/17/17 1342 07/12/17 1400 07/28/17 0233  BNP 315.5* 1,152.4* 528.4*   Basic Metabolic Panel:  Recent Labs Lab 07/28/17 0233  NA 135  K 4.7  CL 96*  CO2 28  GLUCOSE 245*  BUN  39*  CREATININE 2.06*  CALCIUM 9.0   Liver Function Tests: No results for input(s): AST, ALT, ALKPHOS, BILITOT, PROT, ALBUMIN in the last 168 hours. No results for input(s): LIPASE, AMYLASE in the last 168 hours. No results for input(s): AMMONIA in the last 168 hours. CBC:  Recent Labs  Lab 07/28/17 0233  WBC 5.5  HGB 14.0  HCT 42.0  MCV 91.5  PLT 278   Cardiac Enzymes:  Recent Labs Lab 07/28/17 0233 07/28/17 0855 07/28/17 1119 07/28/17 1328  TROPONINI 0.15* 0.16* 0.18* 0.29*   BNP: Invalid input(s): POCBNP CBG:  Recent Labs Lab 07/28/17 1214 07/28/17 1559  GLUCAP 126* 239*   D-Dimer No results for input(s): DDIMER in the last 72 hours. Hgb A1c No results for input(s): HGBA1C in the last 72 hours. Lipid Profile  Recent Labs  07/28/17 0855  CHOL 194  HDL 29*  LDLCALC 130*  TRIG 176*  CHOLHDL 6.7   Thyroid function studies No results for input(s): TSH, T4TOTAL, T3FREE, THYROIDAB in the last 72 hours.  Invalid input(s): FREET3 Anemia work up No results for input(s): VITAMINB12, FOLATE, FERRITIN, TIBC, IRON, RETICCTPCT in the last 72 hours. Urinalysis    Component Value Date/Time   COLORURINE AMBER (A) 09/25/2016 1352   APPEARANCEUR CLOUDY (A) 09/25/2016 1352   LABSPEC 1.027 09/25/2016 1352   PHURINE 5.5 09/25/2016 1352   GLUCOSEU NEGATIVE 09/25/2016 1352   HGBUR LARGE (A) 09/25/2016 1352   BILIRUBINUR SMALL (A) 09/25/2016 1352   KETONESUR 15 (A) 09/25/2016 1352   PROTEINUR >300 (A) 09/25/2016 1352   UROBILINOGEN 1.0 10/12/2015 1830   NITRITE NEGATIVE 09/25/2016 1352   LEUKOCYTESUR NEGATIVE 09/25/2016 1352   Sepsis Labs Invalid input(s): PROCALCITONIN,  WBC,  LACTICIDVEN Microbiology Recent Results (from the past 240 hour(s))  MRSA PCR Screening     Status: None   Collection Time: 07/28/17  6:57 AM  Result Value Ref Range Status   MRSA by PCR NEGATIVE NEGATIVE Final    Comment:        The GeneXpert MRSA Assay (FDA approved for NASAL  specimens only), is one component of a comprehensive MRSA colonization surveillance program. It is not intended to diagnose MRSA infection nor to guide or monitor treatment for MRSA infections.      Time coordinating discharge: Over 30 minutes  SIGNED:   Rondel Jumbo, MD  [facility] 07/28/2017, 5:22 PM   If 7PM-7AM, please contact night-coverage [email or web address] Password [email or web address]

## 2017-07-29 ENCOUNTER — Telehealth: Payer: Self-pay

## 2017-07-29 LAB — HEMOGLOBIN A1C
HEMOGLOBIN A1C: 8.5 % — AB (ref 4.8–5.6)
MEAN PLASMA GLUCOSE: 197 mg/dL

## 2017-07-29 NOTE — Telephone Encounter (Signed)
07/29/17  Hospital Follow Up  Transition Care Management Follow-up Telephone Call  ADMISSION DATE: 8/1/8  DISCHARGE DATE: 8/1/8   How have you been since you were released from the hospital?  Patient states he is feeling good.   Do you understand why you were in the hospital? Yes per patient   Do you understand the discharge instrcutions? Yes per patient    Items Reviewed:  Medications reviewed:  Yes   Allergies reviewed: Yes   Dietary changes reviewed: No salt diet   Referrals reviewed: Yes appointment scheduled with proveder for 08/04/17 @ 11:15 am   Functional Questionnaire:   Activities of Daily Living (ADLs): Per patient he is abe to perform all  Any patient concerns?  Not at this time   Confirmed importance and date/time of follow-up visits scheduled: Yes   Confirmed with patient if condition begins to worsen call PCP or go to the ER.  Yes    Patient was given the office number and encouragred to call back with questions or concerns. Yes

## 2017-07-29 NOTE — Research (Signed)
RESEARCH ENCOUNTER  Patient ID: Patrick Stewart  DOB: 02-21-1956  [name] states he is feeling much better today.  Stated that he had chest pain and had a "belch" that would not "come up."  He let me know that he had too many [place] hotdogs the day prior and tried to drink coffee to help the "belch come up."  I educated the patient on a low sodium diet and how coffee can increase risk of indigestion.  Subject failed to bring investigational product to the hospital stating that he "didn't know [he] would be coming to the hospital."  I educated the subject on the importance of brining study products any time he goes to the hospital and/or urgent care.  Subject states compliance with IP.  IP stopped during hospitalization and will be restarted at discharge.     Patient will follow up with [facility] in October.

## 2017-08-04 ENCOUNTER — Encounter: Payer: Self-pay | Admitting: Family Medicine

## 2017-08-04 ENCOUNTER — Ambulatory Visit (INDEPENDENT_AMBULATORY_CARE_PROVIDER_SITE_OTHER): Payer: Medicare Other | Admitting: Family Medicine

## 2017-08-04 VITALS — BP 130/80 | HR 83 | Temp 97.5°F | Ht 70.0 in | Wt 192.6 lb

## 2017-08-04 DIAGNOSIS — Z09 Encounter for follow-up examination after completed treatment for conditions other than malignant neoplasm: Secondary | ICD-10-CM

## 2017-08-04 DIAGNOSIS — R079 Chest pain, unspecified: Secondary | ICD-10-CM | POA: Diagnosis not present

## 2017-08-04 LAB — CBC
HEMATOCRIT: 46.1 % (ref 39.0–52.0)
HEMOGLOBIN: 14.9 g/dL (ref 13.0–17.0)
MCHC: 32.3 g/dL (ref 30.0–36.0)
MCV: 94 fl (ref 78.0–100.0)
PLATELETS: 233 10*3/uL (ref 150.0–400.0)
RBC: 4.9 Mil/uL (ref 4.22–5.81)
RDW: 16.2 % — ABNORMAL HIGH (ref 11.5–15.5)
WBC: 4.8 10*3/uL (ref 4.0–10.5)

## 2017-08-04 LAB — BASIC METABOLIC PANEL
BUN: 40 mg/dL — AB (ref 6–23)
CALCIUM: 9.8 mg/dL (ref 8.4–10.5)
CHLORIDE: 97 meq/L (ref 96–112)
CO2: 31 meq/L (ref 19–32)
CREATININE: 2.28 mg/dL — AB (ref 0.40–1.50)
GFR: 37.63 mL/min — ABNORMAL LOW (ref >60.00)
GLUCOSE: 160 mg/dL — AB (ref 70–99)
Potassium: 4.4 mEq/L (ref 3.5–5.1)
Sodium: 134 mEq/L — ABNORMAL LOW (ref 135–145)

## 2017-08-04 NOTE — Progress Notes (Addendum)
Chief Complaint  Patient presents with  . Hospitalization Follow-up    for acid reflux    HPI Patrick Stewart is a 61 y.o. y.o. male who presents for a transition of care visit.  Pt was discharged from [facility] on 07/28/17.  Within 48 business hours of discharge our office contacted him via telephone to coordinate his care and needs.   He was admitted for CP r/o, deemed to be GI in nature. He feels fine and is currently taking Protonix without issue.Based off of clinical presentation, lab results and imaging, CHF exacerbation was ruled out. He has a f/u with cardiology in 1 week. He denies chest pain, shortness of breath, abdominal pain, fevers, or urinary complaints.  Social History   Social History  . Marital status: Married   Occupational History  . disabled    Social History Main Topics  . Smoking status: Former Smoker    Packs/day: 0.50    Years: 30.00    Quit date: 07/10/2016  . Smokeless tobacco: Never Used  . Alcohol use No  . Drug use: No  . Sexual activity: Yes    Past Medical History:  Diagnosis Date  . Angina at rest [facility]) 07/08/2016  . CAD (coronary artery disease) 2010   3v CABG  . Chest pain at rest 07/08/2016  . CHF (congestive heart failure) ([place]) 01/26/2012  . CKD (chronic kidney disease) 02/19/2012  . COPD with chronic bronchitis ([place]) 08/26/2016  . Diabetes mellitus type 2, uncontrolled ([place]) 01/26/2012  . Elevated liver function tests 01/26/2012  . Gout 01/26/2012  . Hx of CABG 2010   RCA Stent 2008, CABG x 2 [facility] 2010  . Hypercholesterolemia 07/08/2016  . Hypertension 09/04/2014  . Ischemic cardiomyopathy 11/15/2014  . Kidney stone 10/25/2015  . Lipid disorder 01/26/2012  . NSTEMI (non-ST elevated myocardial infarction) ([place]) 07/08/2016  . ST elevation myocardial infarction (STEMI) ([place]) 2010  . Unstable angina ([place]) 11/14/2014   Family History  Problem Relation Age of Onset  . Hypertension Mother   . Diabetes Mother   . Hyperlipidemia Mother   .  Kidney failure Mother   . Emphysema Father    Allergies as of 08/04/2017      Reactions   Hydrocodone Itching   Tramadol Nausea Only      Medication List       Accurate as of 08/04/17 11:59 PM. Always use your most recent med list.          albuterol 108 (90 Base) MCG/ACT inhaler Commonly known as:  PROVENTIL HFA;VENTOLIN HFA Inhale 2 puffs into the lungs every 6 (six) hours as needed for wheezing or shortness of breath.   albuterol (2.5 MG/3ML) 0.083% nebulizer solution Commonly known as:  PROVENTIL Take 3 mLs (2.5 mg total) by nebulization every 6 (six) hours as needed for wheezing or shortness of breath.   allopurinol 300 MG tablet Commonly known as:  ZYLOPRIM TAKE 1 TABLET BY MOUTH TWICE A DAY   aspirin 81 MG EC tablet Take 1 tablet (81 mg total) by mouth daily.   atorvastatin 80 MG tablet Commonly known as:  LIPITOR Take 1 tablet (80 mg total) by mouth daily.   carvedilol 3.125 MG tablet Commonly known as:  COREG Take 1 tablet (3.125 mg total) by mouth 2 (two) times daily.   digoxin 0.125 MG tablet Commonly known as:  LANOXIN Take 1 tablet (0.125 mg total) by mouth daily.   freestyle lancets Use to check sugars daily   glucose blood test  strip Use as instructed   Investigational - Study Medication Take 1 tablet by mouth 2 (two) times daily. Study name: Galactic HF Study Additional study details: Omecamtiv Mecarbil or Placebo   isosorbide-hydrALAZINE 20-37.5 MG tablet Commonly known as:  BIDIL Take 0.5 tablets by mouth 3 (three) times daily.   metFORMIN 500 MG tablet Commonly known as:  GLUCOPHAGE Week 1: 1 tab daily Week 2: 1 tab twice daily Week 3: 2 tabs in AM, 1 in evening Week 4: 2 tabs twice daily   pantoprazole 40 MG tablet Commonly known as:  PROTONIX Take 1 tablet (40 mg total) by mouth daily.   sacubitril-valsartan 24-26 MG Commonly known as:  ENTRESTO Take 1 tablet by mouth 2 (two) times daily.   spironolactone 25 MG tablet Commonly  known as:  ALDACTONE Take 25 mg by mouth daily.   torsemide 20 MG tablet Commonly known as:  DEMADEX Take 2 tablets (40 mg total) by mouth daily.            Discharge Care Instructions        Start     Ordered   08/04/17 0000  CBC     08/04/17 1127   08/04/17 5188  Basic metabolic panel     41/66/06 1127      ROS:  Constitutional: No fevers or chills, no weight loss HEENT: No headaches, hearing loss, or runny nose, no sore throat Heart: No chest pain Lungs: No SOB, no cough Abd: No bowel changes, no pain, no N/V GU: No urinary complaints Neuro: No numbness, tingling or weakness Msk: No joint or muscle pain  Objective BP 130/80 (BP Location: Left Arm, Patient Position: Sitting, Cuff Size: Normal)   Pulse 83   Temp (!) 97.5 F (36.4 C) (Oral)   Ht 5\' 10"  (1.778 m)   Wt 192 lb 9.6 oz (87.4 kg)   SpO2 97%   BMI 27.64 kg/m  General Appearance:  awake, alert, oriented, in no acute distress and well developed, well nourished Skin:  there are no suspicious lesions or rashes of concern Head/face:  NCAT Eyes:  EOMI, PERRLA Ears:  canals and TMs NI Nose/Sinuses:  negative Mouth/Throat:  Mucosa moist, no lesions; pharynx without erythema, edema or exudate. Neck:  neck- supple, no mass, non-tender and no jvd Lungs: Clear to auscultation.  No rales, rhonchi, or wheezing. Normal effort, no accessory muscle use. Heart:  Heart sounds are normal.  Regular rate and rhythm. No bruits. No edema. Abdomen:  BS+, soft, NT, ND, no masses or organomegaly Musculoskeletal:  No muscle group atrophy or asymmetry, gait normal Neurologic:  Alert and oriented x 3, gait normal., reflexes normal and symmetric, strength and  sensation grossly normal Psych exam: Nml mood and affect, age appropriate judgment and insight  Chest pain, unspecified type  Hospital discharge follow-up - Plan: CBC, Basic metabolic panel  Discharge summary and medication list have been reviewed/reconciled.  Labs  pending at the time of discharge have been reviewed or are still pending at the time of this visit.  Follow-up labs and appointments have been ordered and/or coordinated appropriately. Educational materials regarding the patient's admitting diagnosis provided.  TRANSITIONAL CARE MANAGEMENT CERTIFICATION:  I certify the following are true:   1. Communication with the patient/care giver was made within 2 business days of discharge.  2. Complexity of Medical decision making is moderate.  3. Face to face visit occurred within 14 days of discharge.   Moderate complexity (within 14 days)- 99495 High complexity (within  7 days)- [id number]  F/u as originally scheduled. The patient voiced understanding and agreement to the plan.  D[place], DO 08/26/17 5:12 PM

## 2017-08-04 NOTE — Patient Instructions (Signed)
Give [place] 2-3 business days to get the results of your labs back. If labs are normal, you will likely receive a letter in the mail unless you have MyChart. This can take longer than 2-3 business days.   Drink water rather than orange juice.  Let us know if you need anything.

## 2017-08-11 ENCOUNTER — Encounter (HOSPITAL_COMMUNITY): Payer: Medicare Other

## 2017-08-16 ENCOUNTER — Other Ambulatory Visit: Payer: Self-pay | Admitting: Family Medicine

## 2017-08-16 DIAGNOSIS — N289 Disorder of kidney and ureter, unspecified: Secondary | ICD-10-CM

## 2017-08-18 ENCOUNTER — Telehealth (HOSPITAL_COMMUNITY): Payer: Self-pay | Admitting: Surgery

## 2017-08-18 NOTE — Telephone Encounter (Signed)
Mr Patrick Stewart called and told me that he is experiencing his "same symptoms again"- referring to when he was last hospitalized for HF.  He says that his PCP stopped his Lasix and he has not been taking it at home.  He acknowledges that he missed his last [facility] appt.  I encouraged him to make and keep a new appt.  I have scheduled him for 0830 on Friday morning in the [facility]. He says that he will come to this appt.  I also encouraged him to seek emergency care if his breathing becomes worse or he feels he needs immediate help.

## 2017-08-19 ENCOUNTER — Ambulatory Visit: Payer: Medicare Other | Admitting: Family Medicine

## 2017-08-20 ENCOUNTER — Ambulatory Visit (HOSPITAL_COMMUNITY)
Admission: RE | Admit: 2017-08-20 | Discharge: 2017-08-20 | Disposition: A | Discharge status: Home / Self Care | Payer: Medicare Other | Source: Ambulatory Visit | Attending: Internal Medicine | Admitting: Internal Medicine

## 2017-08-20 ENCOUNTER — Other Ambulatory Visit (INDEPENDENT_AMBULATORY_CARE_PROVIDER_SITE_OTHER): Payer: Medicare Other

## 2017-08-20 ENCOUNTER — Encounter (HOSPITAL_COMMUNITY): Payer: Self-pay

## 2017-08-20 VITALS — BP 154/92 | HR 90 | Wt 202.1 lb

## 2017-08-20 DIAGNOSIS — IMO0002 Reserved for concepts with insufficient information to code with codable children: Secondary | ICD-10-CM

## 2017-08-20 DIAGNOSIS — Z87891 Personal history of nicotine dependence: Secondary | ICD-10-CM | POA: Insufficient documentation

## 2017-08-20 DIAGNOSIS — Z7984 Long term (current) use of oral hypoglycemic drugs: Secondary | ICD-10-CM | POA: Insufficient documentation

## 2017-08-20 DIAGNOSIS — N289 Disorder of kidney and ureter, unspecified: Secondary | ICD-10-CM | POA: Diagnosis not present

## 2017-08-20 DIAGNOSIS — N183 Chronic kidney disease, stage 3 unspecified: Secondary | ICD-10-CM

## 2017-08-20 DIAGNOSIS — I5023 Acute on chronic systolic (congestive) heart failure: Secondary | ICD-10-CM

## 2017-08-20 DIAGNOSIS — Z8249 Family history of ischemic heart disease and other diseases of the circulatory system: Secondary | ICD-10-CM | POA: Insufficient documentation

## 2017-08-20 DIAGNOSIS — Z833 Family history of diabetes mellitus: Secondary | ICD-10-CM | POA: Diagnosis not present

## 2017-08-20 DIAGNOSIS — I255 Ischemic cardiomyopathy: Secondary | ICD-10-CM | POA: Insufficient documentation

## 2017-08-20 DIAGNOSIS — N179 Acute kidney failure, unspecified: Secondary | ICD-10-CM | POA: Diagnosis not present

## 2017-08-20 DIAGNOSIS — I130 Hypertensive heart and chronic kidney disease with heart failure and stage 1 through stage 4 chronic kidney disease, or unspecified chronic kidney disease: Secondary | ICD-10-CM | POA: Insufficient documentation

## 2017-08-20 DIAGNOSIS — I5022 Chronic systolic (congestive) heart failure: Secondary | ICD-10-CM | POA: Diagnosis not present

## 2017-08-20 DIAGNOSIS — E1165 Type 2 diabetes mellitus with hyperglycemia: Secondary | ICD-10-CM | POA: Diagnosis not present

## 2017-08-20 DIAGNOSIS — I447 Left bundle-branch block, unspecified: Secondary | ICD-10-CM | POA: Diagnosis not present

## 2017-08-20 DIAGNOSIS — E1122 Type 2 diabetes mellitus with diabetic chronic kidney disease: Secondary | ICD-10-CM | POA: Insufficient documentation

## 2017-08-20 DIAGNOSIS — N182 Chronic kidney disease, stage 2 (mild): Secondary | ICD-10-CM | POA: Diagnosis not present

## 2017-08-20 DIAGNOSIS — Z79899 Other long term (current) drug therapy: Secondary | ICD-10-CM | POA: Insufficient documentation

## 2017-08-20 DIAGNOSIS — Z7982 Long term (current) use of aspirin: Secondary | ICD-10-CM | POA: Insufficient documentation

## 2017-08-20 LAB — CBC
HCT: 37.4 % — ABNORMAL LOW (ref 39.0–52.0)
Hemoglobin: 12.5 g/dL — ABNORMAL LOW (ref 13.0–17.0)
MCH: 30.3 pg (ref 26.0–34.0)
MCHC: 33.4 g/dL (ref 30.0–36.0)
MCV: 90.8 fL (ref 78.0–100.0)
PLATELETS: 167 10*3/uL (ref 150–400)
RBC: 4.12 MIL/uL — AB (ref 4.22–5.81)
RDW: 14.9 % (ref 11.5–15.5)
WBC: 4.6 10*3/uL (ref 4.0–10.5)

## 2017-08-20 LAB — BASIC METABOLIC PANEL
ANION GAP: 9 (ref 5–15)
BUN: 25 mg/dL — ABNORMAL HIGH (ref 6–23)
BUN: 23 mg/dL — ABNORMAL HIGH (ref 6–20)
CALCIUM: 9.6 mg/dL (ref 8.4–10.5)
CALCIUM: 9.2 mg/dL (ref 8.9–10.3)
CO2: 26 mEq/L (ref 19–32)
CO2: 23 mmol/L (ref 22–32)
Chloride: 103 mEq/L (ref 96–112)
Chloride: 104 mmol/L (ref 101–111)
Creatinine, Ser: 1.63 mg/dL — ABNORMAL HIGH (ref 0.40–1.50)
Creatinine, Ser: 1.56 mg/dL — ABNORMAL HIGH (ref 0.61–1.24)
GFR, EST AFRICAN AMERICAN: 54 mL/min — AB (ref >60)
GFR, EST NON AFRICAN AMERICAN: 46 mL/min — AB (ref >60)
GFR: 55.43 mL/min — AB (ref >60.00)
Glucose, Bld: 155 mg/dL — ABNORMAL HIGH (ref 70–99)
Glucose, Bld: 135 mg/dL — ABNORMAL HIGH (ref 65–99)
POTASSIUM: 4.1 meq/L (ref 3.5–5.1)
Potassium: 4.3 mmol/L (ref 3.5–5.1)
SODIUM: 138 meq/L (ref 135–145)
Sodium: 136 mmol/L (ref 135–145)

## 2017-08-20 LAB — BRAIN NATRIURETIC PEPTIDE: B NATRIURETIC PEPTIDE 5: 1190.6 pg/mL — AB (ref 0.0–100.0)

## 2017-08-20 MED ORDER — METOLAZONE 2.5 MG PO TABS
2.5000 mg | ORAL_TABLET | Freq: Once | ORAL | Status: AC
Start: 2017-08-20 — End: 2017-08-20
  Administered 2017-08-20: 2.5 mg via ORAL
  Filled 2017-08-20: qty 1

## 2017-08-20 MED ORDER — FUROSEMIDE 10 MG/ML IJ SOLN
80.0000 mg | Freq: Once | INTRAMUSCULAR | Status: AC
  Administered 2017-08-20: 80 mg via INTRAVENOUS
  Filled 2017-08-20: qty 8

## 2017-08-20 NOTE — Progress Notes (Signed)
Advanced Heart Failure Clinic Note   Primary Care: Dr. Nani Ravens.  Primary Cardiologist: Dr. Aundra Dubin   HPI:  Patrick Stewart is a 61 year old AA with history of ICM, CAD s/p CABG x 2 (SVG -> OM1, LIMA -> LAD)in 2010, COPD, DMII, gout, former smoker, and HTN. Previously followed at [facility] for CAD/CHF but has been lost to follow up.   Admitted 4/15 - 4/48/18 with A/C systolic CHF. Echo with EF 10% from previous 30-35%. Diuresed with IV lasix and transitioned to torsemide. Down 12 lbs overall. Started on [place]. L/RHC with stable coronaries.    Pt enrolled in [place] HF Study.   Pt presents today for post hospital follow up. PCP stopped torsemide earlier this week based on labs from 08/04/17. Has been drinking a lot of water and Gatorade. Watching salt. Weight at home up from 196/198 to 208 lbs since stopping torsemide earlier this week. Not sleeping well at all + orthopnea. Cough with clear sputum/foam. Denies lightheadedness or dizziness. SOB with minimal activity, but not at rest. Not smoking or drinking.   Social - Lives at home with wife. 2 kids are grown. 9 grandkids. Originally from [place], [place]. Disabled. Worked as Animal nutritionist at [employer].   PMH 1. Chronic systolic CHF: Ischemic cardiomyopathy. Echo 04/12/17 EF 15%, moderately dilated LV, inferior and inferolateral akinesis, septal-lateral dyssynchrony (reviewed personally). EF lower compared to past (31% on 7/17 cardiac MRI).  - RHC/LHC with stable coronary disease and adequate cardiac output.  - Have discussed CRT-D placement. Will continue to optimize meds over next few months. If EF remains low July/August, will need EP referral  2. CKD stage II:  3 CAD: s/p CABG x 2 in 2010 at [facility] - Coronary disease stable with patent grafts on cath 02/2017 despite fall in EF.  4. LBBB: Dyssynchrony on ECG. Likely would benefit from CRT.  5. DM2  [facility] 04/15/2017  Left Main  30% stenosis.  Left Anterior Descending  90% mid LAD stenosis.  Patent LIMA-LAD.  Left Circumflex  Moderate high OM1 with luminal irregularities. Small caliber distal LCx with moderate diffuse disease. 70-80% stenosis proximally in moderate OM2. Patent SVG-OM2.  Right Coronary Artery  30% proximal and mid RCA stenoses.   RA 9 RV 53/11 PA 52/17, mean 35 PCWP mean 14 LV 133/30 AO 136/86 Oxygen saturations: PA 60% AO 97% Cardiac Output (Fick) 4.12  Cardiac Index (Fick) 1.94   Past Medical History:  Diagnosis Date  . Angina at rest [facility]) 07/08/2016  . CAD (coronary artery disease) 2010   3v CABG  . Chest pain at rest 07/08/2016  . CHF (congestive heart failure) ([place]) 01/26/2012  . CKD (chronic kidney disease) 02/19/2012  . COPD with chronic bronchitis ([place]) 08/26/2016  . Diabetes mellitus type 2, uncontrolled ([place]) 01/26/2012  . Elevated liver function tests 01/26/2012  . Gout 01/26/2012  . Hx of CABG 2010   RCA Stent 2008, CABG x 2 [facility] 2010  . Hypercholesterolemia 07/08/2016  . Hypertension 09/04/2014  . Ischemic cardiomyopathy 11/15/2014  . Kidney stone 10/25/2015  . Lipid disorder 01/26/2012  . NSTEMI (non-ST elevated myocardial infarction) ([place]) 07/08/2016  . ST elevation myocardial infarction (STEMI) ([place]) 2010  . Unstable angina ([place]) 11/14/2014    Current Outpatient Prescriptions  Medication Sig Dispense Refill  . albuterol (PROVENTIL HFA;VENTOLIN HFA) 108 (90 Base) MCG/ACT inhaler Inhale 2 puffs into the lungs every 6 (six) hours as needed for wheezing or shortness of breath.    Marland Kitchen albuterol (PROVENTIL) (2.5 MG/3ML) 0.083%  nebulizer solution Take 3 mLs (2.5 mg total) by nebulization every 6 (six) hours as needed for wheezing or shortness of breath. 75 mL 3  . allopurinol (ZYLOPRIM) 300 MG tablet TAKE 1 TABLET BY MOUTH TWICE A DAY (Patient taking differently: TAKE 300 MG BY MOUTH TWICE A DAY) 180 tablet 1  . aspirin EC 81 MG EC tablet Take 1 tablet (81 mg total) by mouth daily. 30 tablet 6  . atorvastatin (LIPITOR) 80 MG tablet  Take 1 tablet (80 mg total) by mouth daily. 30 tablet 11  . carvedilol (COREG) 3.125 MG tablet Take 1 tablet (3.125 mg total) by mouth 2 (two) times daily. 60 tablet 6  . digoxin (LANOXIN) 0.125 MG tablet Take 1 tablet (0.125 mg total) by mouth daily. 30 tablet 6  . glucose blood test strip Use as instructed 100 each 3  . Investigational - Study Medication Take 1 tablet by mouth 2 (two) times daily. Study name: [place] HF Study Additional study details: Omecamtiv Mecarbil or Placebo 1 each PRN  . isosorbide-hydrALAZINE (BIDIL) 20-37.5 MG tablet Take 0.5 tablets by mouth 3 (three) times daily. 30 tablet 0  . Lancets (FREESTYLE) lancets Use to check sugars daily 100 each 3  . metFORMIN (GLUCOPHAGE) 500 MG tablet Week 1: 1 tab daily Week 2: 1 tab twice daily Week 3: 2 tabs in AM, 1 in evening Week 4: 2 tabs twice daily (Patient taking differently: Take 500 mg by mouth 2 (two) times daily. ) 120 tablet 1  . pantoprazole (PROTONIX) 40 MG tablet Take 1 tablet (40 mg total) by mouth daily. 30 tablet 0  . sacubitril-valsartan (ENTRESTO) 24-26 MG Take 1 tablet by mouth 2 (two) times daily. 60 tablet 6  . spironolactone (ALDACTONE) 25 MG tablet Take 25 mg by mouth daily.    Marland Kitchen torsemide (DEMADEX) 20 MG tablet Take 2 tablets (40 mg total) by mouth daily. (Patient not taking: Reported on 08/20/2017) 60 tablet 0   No current facility-administered medications for this encounter.     Allergies  Allergen Reactions  . Hydrocodone Itching  . Tramadol Nausea Only     Social History   Social History  . Marital status: Married    Spouse name: N/A  . Number of children: N/A  . Years of education: N/A   Occupational History  . disabled    Social History Main Topics  . Smoking status: Former Smoker    Packs/day: 0.50    Years: 30.00    Quit date: 07/10/2016  . Smokeless tobacco: Never Used  . Alcohol use No  . Drug use: No  . Sexual activity: Yes   Other Topics Concern  . Not on file   Social  History Narrative  . No narrative on file    Family History  Problem Relation Age of Onset  . Hypertension Mother   . Diabetes Mother   . Hyperlipidemia Mother   . Kidney failure Mother   . Emphysema Father     Vitals:   08/20/17 0831  BP: (!) 154/92  Pulse: 90  SpO2: 99%  Weight: 202 lb 2 oz (91.7 kg)   Wt Readings from Last 3 Encounters:  08/20/17 202 lb 2 oz (91.7 kg)  08/04/17 192 lb 9.6 oz (87.4 kg)  07/28/17 200 lb (90.7 kg)     PHYSICAL EXAM: General: Fatigued appearing. SOB with conversation.  HEENT: Normal Neck: Supple. JVP to jaw. Carotids 2+ bilat; no bruits. No thyromegaly or nodule noted. Cor: PMI nondisplaced. RRR,  No M/G/R noted Lungs: CTAB, normal effort. Abdomen: Soft, non-tender, non-distended, no HSM. No bruits or masses. +BS  Extremities: No cyanosis, clubbing, or rash. 1+ edema to knees.   Neuro: Alert & orientedx3, cranial nerves grossly intact. moves all 4 extremities w/o difficulty. Affect pleasant   ASSESSMENT & PLAN:  1. Chronic systolic CHF: Ischemic cardiomyopathy. Echo 04/12/17 EF 15%, moderately dilated LV, inferior and inferolateral akinesis, septal-lateral dyssynchrony (reviewed personally). EF lower compared to past (31% on 7/17 cardiac MRI).  - RHC/LHC with stable coronary disease and adequate cardiac output.  - Volume status elevated on exam. Will give lasix 80 mg IV + 2.5 mg metolazone now.  ReDs vest 41%.  -  BMET fine. Resume torsemide 40 mg daily. I've told him he can take 60 mg the next two days if breathing and weight have not returned to normal.  - Continue entresto 24/26 mg BID. Will not increase at this time with diuresis.  - Continue spiro 25 mg daily - Continue coreg 3.25 mg BID.  - Continue digoxin 0.125 mg daily.  - We discussed CRT-D placement. EF remains low by Echo in July. He needs EP referral.  2. AKI on CKD stage II:  - Up to 2.2 earlier this month. Lost to follow up as far as lab results from PCP, and torsemide  only recently stopped.  - BMET today shows improvement in creatinine, though this in the setting of volume overload.  3. CAD: Coronary disease stable with patent grafts despite fall in EF.  - Continue statin and ASA 81. No chest pain.  4. LBBB: Dyssynchrony on ECG. Likely would benefit from CRT.  - Stable. Referring to EP.  5. DM: - Per PCP. If creatinine markedly elevated likely will need to hold/stop metformin.  Received IV lasix in clinic with >800 cc of clear urine output.   Resume torsemide at 40 mg daily. Repeat labs next week. Follow up 2 weeks.   Patrick Friar, PA-C 08/20/17   Greater than 50% of the 40 minute visit was spent in counseling/coordination of care regarding IV medication administration, disease state education, sliding scale diuretics, salt/fluid restriction, and medication reconciliation.

## 2017-08-20 NOTE — Progress Notes (Signed)
24 g PIV inserted x 1 attempt to LLFA for IV lasix per Oda Kilts PA-C VO. 80 mg IV lasix pushed x 1 dose over 4 minutes and saline locked. Also given 2.5 mg PO metoalzone tablet. Patient tolerated well and resting in room 3 of CHF clinic with call bell and urinal in reach. Will continue to monitor closely.  Total UOP: >800 cc light clear yellow urine  PIV removed before patient discharged from clinic visit.  Renee Pain, RN    Agree.   Legrand Como [address]" Ridott, PA-C 08/20/2017 12:20 PM

## 2017-08-20 NOTE — Patient Instructions (Addendum)
IV Lasix given today.  Will refer you to electrophysiology at [facility]. Address: [address] ([place]), [place], [place] [address]  Phone: [phone]  Return in 1 week for repeat labs.  Follow up 2 weeks with Oda Kilts PA-C.  Take all medication as prescribed the day of your appointment. Bring all medications with you to your appointment.  Do the following things EVERYDAY: 1) Weigh yourself in the morning before breakfast. Write it down and keep it in a log. 2) Take your medicines as prescribed 3) Eat low salt foods-Limit salt (sodium) to 2000 mg per day.  4) Stay as active as you can everyday 5) Limit all fluids for the day to less than 2 liters

## 2017-08-27 ENCOUNTER — Ambulatory Visit (HOSPITAL_COMMUNITY)
Admission: RE | Admit: 2017-08-27 | Discharge: 2017-08-27 | Disposition: A | Discharge status: Home / Self Care | Payer: Medicare Other | Source: Ambulatory Visit | Attending: Cardiology | Admitting: Cardiology

## 2017-08-27 DIAGNOSIS — I5023 Acute on chronic systolic (congestive) heart failure: Secondary | ICD-10-CM | POA: Diagnosis not present

## 2017-08-27 LAB — BASIC METABOLIC PANEL
Anion gap: 10 (ref 5–15)
BUN: 32 mg/dL — AB (ref 6–20)
CALCIUM: 8.8 mg/dL — AB (ref 8.9–10.3)
CHLORIDE: 99 mmol/L — AB (ref 101–111)
CO2: 28 mmol/L (ref 22–32)
CREATININE: 1.82 mg/dL — AB (ref 0.61–1.24)
GFR calc non Af Amer: 38 mL/min — ABNORMAL LOW (ref >60)
GFR, EST AFRICAN AMERICAN: 45 mL/min — AB (ref >60)
Glucose, Bld: 172 mg/dL — ABNORMAL HIGH (ref 65–99)
Potassium: 3.8 mmol/L (ref 3.5–5.1)
SODIUM: 137 mmol/L (ref 135–145)

## 2017-08-31 ENCOUNTER — Encounter: Payer: Self-pay | Admitting: Cardiology

## 2017-08-31 ENCOUNTER — Ambulatory Visit (INDEPENDENT_AMBULATORY_CARE_PROVIDER_SITE_OTHER): Payer: Medicare Other | Admitting: Cardiology

## 2017-08-31 VITALS — BP 158/98 | HR 92 | Ht 70.5 in | Wt 198.8 lb

## 2017-08-31 DIAGNOSIS — I5022 Chronic systolic (congestive) heart failure: Secondary | ICD-10-CM

## 2017-08-31 DIAGNOSIS — I447 Left bundle-branch block, unspecified: Secondary | ICD-10-CM

## 2017-08-31 DIAGNOSIS — I25708 Atherosclerosis of coronary artery bypass graft(s), unspecified, with other forms of angina pectoris: Secondary | ICD-10-CM

## 2017-08-31 DIAGNOSIS — I255 Ischemic cardiomyopathy: Secondary | ICD-10-CM

## 2017-08-31 NOTE — Patient Instructions (Signed)
Medication Instructions:  Your physician recommends that you continue on your current medications as directed. Please refer to the Current Medication list given to you today.  If you need a refill on your cardiac medications before your next appointment, please call your pharmacy.   Labwork: None ordered  Testing/Procedures: Your physician has recommended that you have a defibrillator inserted. An implantable cardioverter defibrillator (ICD) is a small device that is placed in your chest or, in rare cases, your abdomen. This device uses electrical pulses or shocks to help control life-threatening, irregular heartbeats that could lead the heart to suddenly stop beating (sudden cardiac arrest). Leads are attached to the ICD that goes into your heart. This is done in the hospital and usually requires an overnight stay.   Information given to you today.  Please call the office when you have made a decision, if you would like to proceed.   Follow-Up: To be determined  Thank you for choosing [facility]!!   Trinidad Curet, RN [phone]  Any Other Special Instructions Will Be Listed Below (If Applicable).  Cardioverter Defibrillator Implantation An implantable cardioverter defibrillator (ICD) is a small device that is placed under the skin in the chest or abdomen. An ICD consists of a battery, a small computer (pulse generator), and wires (leads) that go into the heart. An ICD is used to detect and correct two types of dangerous irregular heartbeats (arrhythmias):  A rapid heart rhythm (tachycardia).  An arrhythmia in which the lower chambers of the heart (ventricles) contract in an uncoordinated way (fibrillation).  When an ICD detects tachycardia, it sends a low-energy shock to the heart to restore the heartbeat to normal (cardioversion). This signal is usually painless. If cardioversion does not work or if the ICD detects fibrillation, it delivers a high-energy shock to the heart  (defibrillation) to restart the heart. This shock may feel like a strong jolt in the chest. Your health care provider may prescribe an ICD if:  You have had an arrhythmia that originated in the ventricles.  Your heart has been damaged by a disease or heart condition.  Sometimes, ICDs are programmed to act as a device called a pacemaker. Pacemakers can be used to treat a slow heartbeat (bradycardia) or tachycardia by taking over the heart rate with electrical impulses. Tell a health care provider about:  Any allergies you have.  All medicines you are taking, including vitamins, herbs, eye drops, creams, and over-the-counter medicines.  Any problems you or family members have had with anesthetic medicines.  Any blood disorders you have.  Any surgeries you have had.  Any medical conditions you have.  Whether you are pregnant or may be pregnant. What are the risks? Generally, this is a safe procedure. However, problems may occur, including:  Swelling, bleeding, or bruising.  Infection.  Blood clots.  Damage to other structures or organs, such as nerves, blood vessels, or the heart.  Allergic reactions to medicines used during the procedure.  What happens before the procedure? Staying hydrated Follow instructions from your health care provider about hydration, which may include:  Up to 2 hours before the procedure - you may continue to drink clear liquids, such as water, clear fruit juice, black coffee, and plain tea.  Eating and drinking restrictions Follow instructions from your health care provider about eating and drinking, which may include:  8 hours before the procedure - stop eating heavy meals or foods such as meat, fried foods, or fatty foods.  6 hours before  the procedure - stop eating light meals or foods, such as toast or cereal.  6 hours before the procedure - stop drinking milk or drinks that contain milk.  2 hours before the procedure - stop drinking clear  liquids.  Medicine Ask your health care provider about:  Changing or stopping your normal medicines. This is important if you take diabetes medicines or blood thinners.  Taking medicines such as aspirin and ibuprofen. These medicines can thin your blood. Do not take these medicines before your procedure if your doctor tells you not to.  Tests  You may have blood tests.  You may have a test to check the electrical signals in your heart (electrocardiogram, ECG).  You may have imaging tests, such as a chest X-ray. General instructions  For 24 hours before the procedure, stop using products that contain nicotine or tobacco, such as cigarettes and e-cigarettes. If you need help quitting, ask your health care provider.  Plan to have someone take you home from the hospital or clinic.  You may be asked to shower with a germ-killing soap. What happens during the procedure?  To reduce your risk of infection: ? Your health care team will wash or sanitize their hands. ? Your skin will be washed with soap. ? Hair may be removed from the surgical area.  Small monitors will be put on your body. They will be used to check your heart, blood pressure, and oxygen level.  An IV tube will be inserted into one of your veins.  You will be given one or more of the following: ? A medicine to help you relax (sedative). ? A medicine to numb the area (local anesthetic). ? A medicine to make you fall asleep (general anesthetic).  Leads will be guided through a blood vessel into your heart and attached to your heart muscles. Depending on the ICD, the leads may go into one ventricle or they may go into both ventricles and into an upper chamber of the heart. An X-ray machine (fluoroscope) will be usedto help guide the leads.  A small incision will be made to create a deep pocket under your skin.  The pulse generator will be placed into the pocket.  The ICD will be tested.  The incision will be closed  with stitches (sutures), skin glue, or staples.  A bandage (dressing) will be placed over the incision. This procedure may vary among health care providers and hospitals. What happens after the procedure?  Your blood pressure, heart rate, breathing rate, and blood oxygen level will be monitored often until the medicines you were given have worn off.  A chest X-ray will be taken to check that the ICD is in the right place.  You will need to stay in the hospital for 1-2 days so your health care provider can make sure your ICD is working.  Do not drive for 24 hours if you received a sedative. Ask your health care provider when it is safe for you to drive.  You may be given an identification card explaining that you have an ICD. Summary  An implantable cardioverter defibrillator (ICD) is a small device that is placed under the skin in the chest or abdomen. It is used to detect and correct dangerous irregular heartbeats (arrhythmias).  An ICD consists of a battery, a small computer (pulse generator), and wires (leads) that go into the heart.  When an ICD detects rapid heart rhythm (tachycardia), it sends a low-energy shock to the heart to  restore the heartbeat to normal (cardioversion). If cardioversion does not work or if the ICD detects uncoordinated heart contractions (fibrillation), it delivers a high-energy shock to the heart (defibrillation) to restart the heart.  You will need to stay in the hospital for 1-2 days to make sure your ICD is working. This information is not intended to replace advice given to you by your health care provider. Make sure you discuss any questions you have with your health care provider. Document Released: 09/05/2002 Document Revised: 12/23/2016 Document Reviewed: 12/23/2016 Elsevier Interactive Patient Education  2017 [employer].

## 2017-08-31 NOTE — Progress Notes (Signed)
Electrophysiology Office Note   Date:  08/31/2017   ID:  Patrick Stewart, DOB 03-22-1956, MRN [id number]  PCP:  Shelda Pal, DO  Cardiologist:  Aundra Dubin Primary Electrophysiologist:  Calub Tarnow Meredith Leeds, MD    Chief Complaint  Patient presents with  . consult for ICD     History of Present Illness: Patrick Stewart is a 61 y.o. male who is being seen today for the evaluation of CHF at the request of Shelda Pal*. Presenting today for electrophysiology evaluation. History of ischemic cardio myopathy with an EF 15%, CKG stage II, coronary artery disease status post CABG 2 in 2010 (SVG -> OM1, LIMA -> LAD), left bundle branch block, and type 2 diabetes.  Today, he denies symptoms of palpitations, chest pain, shortness of breath, orthopnea, PND, lower extremity edema, claudication, dizziness, presyncope, syncope, bleeding, or neurologic sequela. The patient is tolerating medications without difficulties.    Past Medical History:  Diagnosis Date  . Angina at rest [facility]) 07/08/2016  . CAD (coronary artery disease) 2010   3v CABG  . Chest pain at rest 07/08/2016  . CHF (congestive heart failure) ([place]) 01/26/2012  . CKD (chronic kidney disease) 02/19/2012  . COPD with chronic bronchitis ([place]) 08/26/2016  . Diabetes mellitus type 2, uncontrolled ([place]) 01/26/2012  . Elevated liver function tests 01/26/2012  . Gout 01/26/2012  . Hx of CABG 2010   RCA Stent 2008, CABG x 2 [facility] 2010  . Hypercholesterolemia 07/08/2016  . Hypertension 09/04/2014  . Ischemic cardiomyopathy 11/15/2014  . Kidney stone 10/25/2015  . Lipid disorder 01/26/2012  . NSTEMI (non-ST elevated myocardial infarction) ([place]) 07/08/2016  . ST elevation myocardial infarction (STEMI) ([place]) 2010  . Unstable angina ([place]) 11/14/2014   Past Surgical History:  Procedure Laterality Date  . CORONARY ARTERY BYPASS GRAFT  2010  . KNEE SURGERY    . RIGHT/LEFT HEART CATH AND CORONARY ANGIOGRAPHY N/A 04/15/2017   Procedure: Right/Left Heart Cath and Coronary Angiography;  Surgeon: Larey Dresser, MD;  Location: [facility];  Service: Cardiovascular;  Laterality: N/A;  . ROTATOR CUFF REPAIR    . WRIST SURGERY       Current Outpatient Prescriptions  Medication Sig Dispense Refill  . albuterol (PROVENTIL HFA;VENTOLIN HFA) 108 (90 Base) MCG/ACT inhaler Inhale 2 puffs into the lungs every 6 (six) hours as needed for wheezing or shortness of breath.    Marland Kitchen albuterol (PROVENTIL) (2.5 MG/3ML) 0.083% nebulizer solution Take 3 mLs (2.5 mg total) by nebulization every 6 (six) hours as needed for wheezing or shortness of breath. 75 mL 3  . allopurinol (ZYLOPRIM) 300 MG tablet Take 300 mg by mouth 2 (two) times daily.    Marland Kitchen aspirin EC 81 MG EC tablet Take 1 tablet (81 mg total) by mouth daily. 30 tablet 6  . atorvastatin (LIPITOR) 80 MG tablet Take 1 tablet (80 mg total) by mouth daily. 30 tablet 11  . carvedilol (COREG) 3.125 MG tablet Take 1 tablet (3.125 mg total) by mouth 2 (two) times daily. 60 tablet 6  . digoxin (LANOXIN) 0.125 MG tablet Take 1 tablet (0.125 mg total) by mouth daily. 30 tablet 6  . glucose blood test strip Use as instructed 100 each 3  . Investigational - Study Medication Take 1 tablet by mouth 2 (two) times daily. Study name: Galactic HF Study Additional study details: Omecamtiv Mecarbil or Placebo 1 each PRN  . isosorbide-hydrALAZINE (BIDIL) 20-37.5 MG tablet Take 0.5 tablets by mouth 3 (three) times daily. [place]  tablet 0  . Lancets (FREESTYLE) lancets Use to check sugars daily 100 each 3  . metFORMIN (GLUCOPHAGE) 500 MG tablet Take 500 mg by mouth 2 (two) times daily with a meal.    . pantoprazole (PROTONIX) 40 MG tablet Take 1 tablet (40 mg total) by mouth daily. 30 tablet 0  . sacubitril-valsartan (ENTRESTO) 24-26 MG Take 1 tablet by mouth 2 (two) times daily. 60 tablet 6  . spironolactone (ALDACTONE) 25 MG tablet Take 25 mg by mouth daily.    Marland Kitchen torsemide (DEMADEX) 20 MG tablet Take  2 tablets (40 mg total) by mouth daily. 60 tablet 0   No current facility-administered medications for this visit.     Allergies:   Hydrocodone and Tramadol   Social History:  The patient  reports that he quit smoking about 13 months ago. He has a 15.00 pack-year smoking history. He has never used smokeless tobacco. He reports that he does not drink alcohol or use drugs.   Family History:  The patient's family history includes Diabetes in his mother; Emphysema in his father; Hyperlipidemia in his mother; Hypertension in his mother; Kidney failure in his mother.    ROS:  Please see the history of present illness.   Otherwise, review of systems is positive for weight change, leg swelling, shortness of breath, cough.   All other systems are reviewed and negative.    PHYSICAL EXAM: VS:  BP (!) 158/98   Pulse 92   Ht 5' 10.5" (1.791 m)   Wt 198 lb 12.8 oz (90.2 kg)   BMI 28.12 kg/m  , BMI Body mass index is 28.12 kg/m. GEN: Well nourished, well developed, in no acute distress  HEENT: normal  Neck: no JVD, carotid bruits, or masses Cardiac: RRR; no murmurs, rubs, or gallops,no edema  Respiratory:  clear to auscultation bilaterally, normal work of breathing GI: soft, nontender, nondistended, + BS MS: no deformity or atrophy  Skin: warm and dry Neuro:  Strength and sensation are intact Psych: euthymic mood, full affect  EKG:  EKG is not ordered today. Personal review of the ekg ordered 07/29/17 shows sinus rhythm, left atrial enlargement, left bundle branch block QRS 176, rate 78  Recent Labs: 05/25/2017: ALT 11 07/14/2017: Magnesium 1.6 08/20/2017: B Natriuretic Peptide 1,190.6; Hemoglobin 12.5; Platelets 167 08/27/2017: BUN 32; Creatinine, Ser 1.82; Potassium 3.8; Sodium 137    Lipid Panel     Component Value Date/Time   CHOL 194 07/28/2017 0855   TRIG 176 (H) 07/28/2017 0855   HDL 29 (L) 07/28/2017 0855   CHOLHDL 6.7 07/28/2017 0855   VLDL 35 07/28/2017 0855   LDLCALC 130 (H)  07/28/2017 0855   LDLDIRECT 138.0 08/26/2016 0948     Wt Readings from Last 3 Encounters:  08/31/17 198 lb 12.8 oz (90.2 kg)  08/20/17 202 lb 2 oz (91.7 kg)  08/04/17 192 lb 9.6 oz (87.4 kg)      Other studies Reviewed: Additional studies/ records that were reviewed today include: TTE 07/13/17  Review of the above records today demonstrates:  - Left ventricle: The cavity size was moderately dilated. Wall   thickness was increased in a pattern of mild LVH. Systolic   function was severely reduced. The estimated ejection fraction   was in the range of 20% to 25%. Diffuse hypokinesis. There is   akinesis of the inferior myocardium. - Ventricular septum: Septal motion showed abnormal function and   dyssynergy. - Aortic valve: There was moderate regurgitation. - Aorta: Aortic root  dimension: 39 mm (ED). - Ascending aorta: The ascending aorta was mildly dilated. - Mitral valve: There was mild regurgitation. Valve area by   pressure half-time: 1.35 cm^2. - Left atrium: The atrium was moderately dilated. - Pulmonary arteries: Systolic pressure was moderately to severely   increased. PA peak pressure: 61 mm Hg (S).  LHC/RHC 04/15/17 Left Main  30% stenosis.  Left Anterior Descending  90% mid LAD stenosis. Patent LIMA-LAD.  Left Circumflex  Moderate high OM1 with luminal irregularities. Small caliber distal LCx with moderate diffuse disease. 70-80% stenosis proximally in moderate OM2. Patent SVG-OM2.  Right Coronary Artery  30% proximal and mid RCA stenoses.    ASSESSMENT AND PLAN:  1.  Chronic systolic heart failure due to ischemic cardiomyopathy: Ejection fraction 20-25% on most recent echo. Is currently on Entresto, Coreg, digoxin and Aldactone. Would likely benefit from CRT-deep implant. Risks and benefits were discussed. Risks include bleeding, tamponade, infection, pneumothorax. He had many questions about the defibrillator which were answered this visit. He says that he would  like to discuss this decision with his wife, but he would probably agree down the road. We'll give him information on CRT-D; later in the week for possible scheduling of the procedure.  2. CKD stage II: Has been put back on torsemide with stabilization of creatinine.  3. Coronary artery disease status post CABG: Coronary disease stable on most recent cath.  4. Left bundle branch block: A few dyssynchrony on his echo. Would plan for a CRT-P implantation.    Current medicines are reviewed at length with the patient today.   The patient does not have concerns regarding his medicines.  The following changes were made today:  none  Labs/ tests ordered today include:  No orders of the defined types were placed in this encounter.    Disposition:   FU with Jamario Colina 3 months  Signed, Vian Fluegel Meredith Leeds, MD  08/31/2017 8:38 AM     [facility] [address] [place] [address] [phone] (office) [phone] (fax)

## 2017-09-03 ENCOUNTER — Encounter (HOSPITAL_COMMUNITY): Payer: Self-pay

## 2017-09-03 ENCOUNTER — Ambulatory Visit (HOSPITAL_COMMUNITY)
Admission: RE | Admit: 2017-09-03 | Discharge: 2017-09-03 | Disposition: A | Discharge status: Home / Self Care | Payer: Medicare Other | Source: Ambulatory Visit | Attending: Internal Medicine | Admitting: Internal Medicine

## 2017-09-03 VITALS — BP 142/90 | HR 84 | Wt 198.5 lb

## 2017-09-03 DIAGNOSIS — Z8249 Family history of ischemic heart disease and other diseases of the circulatory system: Secondary | ICD-10-CM | POA: Insufficient documentation

## 2017-09-03 DIAGNOSIS — Z87442 Personal history of urinary calculi: Secondary | ICD-10-CM | POA: Diagnosis not present

## 2017-09-03 DIAGNOSIS — Z888 Allergy status to other drugs, medicaments and biological substances status: Secondary | ICD-10-CM | POA: Diagnosis not present

## 2017-09-03 DIAGNOSIS — Z951 Presence of aortocoronary bypass graft: Secondary | ICD-10-CM | POA: Diagnosis not present

## 2017-09-03 DIAGNOSIS — I252 Old myocardial infarction: Secondary | ICD-10-CM | POA: Diagnosis not present

## 2017-09-03 DIAGNOSIS — Z87891 Personal history of nicotine dependence: Secondary | ICD-10-CM | POA: Insufficient documentation

## 2017-09-03 DIAGNOSIS — E1165 Type 2 diabetes mellitus with hyperglycemia: Secondary | ICD-10-CM

## 2017-09-03 DIAGNOSIS — Z841 Family history of disorders of kidney and ureter: Secondary | ICD-10-CM | POA: Diagnosis not present

## 2017-09-03 DIAGNOSIS — I25708 Atherosclerosis of coronary artery bypass graft(s), unspecified, with other forms of angina pectoris: Secondary | ICD-10-CM | POA: Diagnosis not present

## 2017-09-03 DIAGNOSIS — Z885 Allergy status to narcotic agent status: Secondary | ICD-10-CM | POA: Diagnosis not present

## 2017-09-03 DIAGNOSIS — I5022 Chronic systolic (congestive) heart failure: Secondary | ICD-10-CM | POA: Diagnosis not present

## 2017-09-03 DIAGNOSIS — Z833 Family history of diabetes mellitus: Secondary | ICD-10-CM | POA: Insufficient documentation

## 2017-09-03 DIAGNOSIS — I447 Left bundle-branch block, unspecified: Secondary | ICD-10-CM | POA: Insufficient documentation

## 2017-09-03 DIAGNOSIS — E1122 Type 2 diabetes mellitus with diabetic chronic kidney disease: Secondary | ICD-10-CM

## 2017-09-03 DIAGNOSIS — Z7984 Long term (current) use of oral hypoglycemic drugs: Secondary | ICD-10-CM | POA: Diagnosis not present

## 2017-09-03 DIAGNOSIS — Z825 Family history of asthma and other chronic lower respiratory diseases: Secondary | ICD-10-CM | POA: Diagnosis not present

## 2017-09-03 DIAGNOSIS — E7800 Pure hypercholesterolemia, unspecified: Secondary | ICD-10-CM | POA: Diagnosis not present

## 2017-09-03 DIAGNOSIS — Z7982 Long term (current) use of aspirin: Secondary | ICD-10-CM | POA: Diagnosis not present

## 2017-09-03 DIAGNOSIS — M109 Gout, unspecified: Secondary | ICD-10-CM | POA: Insufficient documentation

## 2017-09-03 DIAGNOSIS — J449 Chronic obstructive pulmonary disease, unspecified: Secondary | ICD-10-CM | POA: Insufficient documentation

## 2017-09-03 DIAGNOSIS — Z79899 Other long term (current) drug therapy: Secondary | ICD-10-CM | POA: Insufficient documentation

## 2017-09-03 DIAGNOSIS — I255 Ischemic cardiomyopathy: Secondary | ICD-10-CM | POA: Diagnosis not present

## 2017-09-03 DIAGNOSIS — N183 Chronic kidney disease, stage 3 unspecified: Secondary | ICD-10-CM

## 2017-09-03 DIAGNOSIS — N182 Chronic kidney disease, stage 2 (mild): Secondary | ICD-10-CM | POA: Insufficient documentation

## 2017-09-03 DIAGNOSIS — IMO0002 Reserved for concepts with insufficient information to code with codable children: Secondary | ICD-10-CM

## 2017-09-03 DIAGNOSIS — I2510 Atherosclerotic heart disease of native coronary artery without angina pectoris: Secondary | ICD-10-CM | POA: Diagnosis not present

## 2017-09-03 DIAGNOSIS — I130 Hypertensive heart and chronic kidney disease with heart failure and stage 1 through stage 4 chronic kidney disease, or unspecified chronic kidney disease: Secondary | ICD-10-CM | POA: Insufficient documentation

## 2017-09-03 LAB — BASIC METABOLIC PANEL
ANION GAP: 9 (ref 5–15)
BUN: 28 mg/dL — ABNORMAL HIGH (ref 6–20)
CALCIUM: 9 mg/dL (ref 8.9–10.3)
CO2: 26 mmol/L (ref 22–32)
CREATININE: 1.87 mg/dL — AB (ref 0.61–1.24)
Chloride: 104 mmol/L (ref 101–111)
GFR calc Af Amer: 43 mL/min — ABNORMAL LOW (ref >60)
GFR calc non Af Amer: 37 mL/min — ABNORMAL LOW (ref >60)
GLUCOSE: 150 mg/dL — AB (ref 65–99)
Potassium: 3.9 mmol/L (ref 3.5–5.1)
Sodium: 139 mmol/L (ref 135–145)

## 2017-09-03 NOTE — Patient Instructions (Signed)
Routine lab work today. Will notify you of abnormal results, otherwise no news is good news!  No changes to medication at this time.  Follow up 2 months. Take all medication as prescribed the day of your appointment. Bring all medications with you to your appointment.  Do the following things EVERYDAY: 1) Weigh yourself in the morning before breakfast. Write it down and keep it in a log. 2) Take your medicines as prescribed 3) Eat low salt foods-Limit salt (sodium) to 2000 mg per day.  4) Stay as active as you can everyday 5) Limit all fluids for the day to less than 2 liters  

## 2017-09-03 NOTE — Progress Notes (Signed)
Advanced Heart Failure Clinic Note   Primary Care: Dr. Nani Ravens.  Primary Cardiologist: Dr. Aundra Dubin   HPI: Patrick Stewart is a 61 year old AA with history of ICM, CAD s/p CABG x 2 (SVG -> OM1, LIMA -> LAD)in 2010, COPD, DMII, gout, former smoker, and HTN. Previously followed at [facility] for CAD/CHF but has been lost to follow up.   Admitted 4/15 - [birth year]/05/61 with A/C systolic CHF. Echo with EF 10% from previous 30-35%. Diuresed with IV lasix and transitioned to torsemide. Down 12 lbs overall. Started on [place]. L/RHC with stable coronaries.    Pt enrolled in [place] HF Study.   Patrick Stewart returns today for HF follow up. At last visit 2 weeks ago was given IV lasix. Eating low salt diet, drinking less than 2L a day. No Gatorade. Taking all medications. Feels much better today. No SOB with walking around stores.   Social - Lives at home with wife. 2 kids are grown. 9 grandkids. Originally from [place], [place]. Disabled. Worked as Animal nutritionist at [employer].   PMH 1. Chronic systolic CHF: Ischemic cardiomyopathy. Echo 04/12/17 EF 15%, moderately dilated LV, inferior and inferolateral akinesis, septal-lateral dyssynchrony (reviewed personally). EF lower compared to past (31% on 7/17 cardiac MRI).  - RHC/LHC with stable coronary disease and adequate cardiac output.  - Have discussed CRT-D placement. Will continue to optimize meds over next few months. If EF remains low July/August, will need EP referral  2. CKD stage II:  3 CAD: s/p CABG x 2 in 2010 at [facility] - Coronary disease stable with patent grafts on cath 02/2017 despite fall in EF.  4. LBBB: Dyssynchrony on ECG. Likely would benefit from CRT.  5. DM2  [facility] 04/15/2017  Left Main  30% stenosis.  Left Anterior Descending  90% mid LAD stenosis. Patent LIMA-LAD.  Left Circumflex  Moderate high OM1 with luminal irregularities. Small caliber distal LCx with moderate diffuse disease. 70-80% stenosis proximally in moderate OM2. Patent SVG-OM2.    Right Coronary Artery  30% proximal and mid RCA stenoses.   RA 9 RV 53/11 PA 52/17, mean 35 PCWP mean 14 LV 133/30 AO 136/86 Oxygen saturations: PA 60% AO 97% Cardiac Output ([place]) 4.12  Cardiac Index ([place]) 1.94   Past Medical History:  Diagnosis Date  . Angina at rest [facility]) 07/08/2016  . CAD (coronary artery disease) 2010   3v CABG  . Chest pain at rest 07/08/2016  . CHF (congestive heart failure) ([place]) 01/26/2012  . CKD (chronic kidney disease) 02/19/2012  . COPD with chronic bronchitis ([place]) 08/26/2016  . Diabetes mellitus type 2, uncontrolled ([place]) 01/26/2012  . Elevated liver function tests 01/26/2012  . Gout 01/26/2012  . Hx of CABG 2010   RCA Stent 2008, CABG x 2 [facility] 2010  . Hypercholesterolemia 07/08/2016  . Hypertension 09/04/2014  . Ischemic cardiomyopathy 11/15/2014  . Kidney stone 10/25/2015  . Lipid disorder 01/26/2012  . NSTEMI (non-ST elevated myocardial infarction) ([place]) 07/08/2016  . ST elevation myocardial infarction (STEMI) ([place]) 2010  . Unstable angina ([place]) 11/14/2014    Current Outpatient Prescriptions  Medication Sig Dispense Refill  . albuterol (PROVENTIL HFA;VENTOLIN HFA) 108 (90 Base) MCG/ACT inhaler Inhale 2 puffs into the lungs every 6 (six) hours as needed for wheezing or shortness of breath.    Marland Kitchen albuterol (PROVENTIL) (2.5 MG/3ML) 0.083% nebulizer solution Take 3 mLs (2.5 mg total) by nebulization every 6 (six) hours as needed for wheezing or shortness of breath. 75 mL 3  . allopurinol (ZYLOPRIM)  300 MG tablet Take 300 mg by mouth 2 (two) times daily.    Marland Kitchen aspirin EC 81 MG EC tablet Take 1 tablet (81 mg total) by mouth daily. 30 tablet 6  . atorvastatin (LIPITOR) 80 MG tablet Take 1 tablet (80 mg total) by mouth daily. 30 tablet 11  . carvedilol (COREG) 3.125 MG tablet Take 1 tablet (3.125 mg total) by mouth 2 (two) times daily. 60 tablet 6  . digoxin (LANOXIN) 0.125 MG tablet Take 1 tablet (0.125 mg total) by mouth daily. 30 tablet 6   . glucose blood test strip Use as instructed 100 each 3  . Investigational - Study Medication Take 1 tablet by mouth 2 (two) times daily. Study name: Galactic HF Study Additional study details: Omecamtiv Mecarbil or Placebo 1 each PRN  . Lancets (FREESTYLE) lancets Use to check sugars daily 100 each 3  . metFORMIN (GLUCOPHAGE) 500 MG tablet Take 500 mg by mouth 2 (two) times daily with a meal.    . pantoprazole (PROTONIX) 40 MG tablet Take 1 tablet (40 mg total) by mouth daily. 30 tablet 0  . sacubitril-valsartan (ENTRESTO) 24-26 MG Take 1 tablet by mouth 2 (two) times daily. 60 tablet 6  . spironolactone (ALDACTONE) 25 MG tablet Take 25 mg by mouth daily.    Marland Kitchen torsemide (DEMADEX) 20 MG tablet Take 2 tablets (40 mg total) by mouth daily. 60 tablet 0  . isosorbide-hydrALAZINE (BIDIL) 20-37.5 MG tablet Take 0.5 tablets by mouth 3 (three) times daily. (Patient not taking: Reported on 09/03/2017) 30 tablet 0   No current facility-administered medications for this encounter.     Allergies  Allergen Reactions  . Hydrocodone Itching  . Tramadol Nausea Only     Social History   Social History  . Marital status: Married    Spouse name: N/A  . Number of children: N/A  . Years of education: N/A   Occupational History  . disabled    Social History Main Topics  . Smoking status: Former Smoker    Packs/day: 0.50    Years: 30.00    Quit date: 07/10/2016  . Smokeless tobacco: Never Used  . Alcohol use No  . Drug use: No  . Sexual activity: Yes   Other Topics Concern  . Not on file   Social History Narrative  . No narrative on file    Family History  Problem Relation Age of Onset  . Hypertension Mother   . Diabetes Mother   . Hyperlipidemia Mother   . Kidney failure Mother   . Emphysema Father     Vitals:   09/03/17 0830  BP: (!) 142/90  Pulse: 84  SpO2: 99%  Weight: 198 lb 8 oz (90 kg)   Wt Readings from Last 3 Encounters:  09/03/17 198 lb 8 oz (90 kg)  08/31/17 198 lb  12.8 oz (90.2 kg)  08/20/17 202 lb 2 oz (91.7 kg)     PHYSICAL EXAM: General: Well appearing. No resp difficulty. HEENT: Normal Neck: Supple. JVP 5-6. Carotids 2+ bilat; no bruits. No thyromegaly or nodule noted. Cor: PMI nondisplaced. RRR, No M/G/R noted Lungs: CTAB, normal effort. Abdomen: Soft, non-tender, non-distended, no HSM. No bruits or masses. +BS  Extremities: No cyanosis, clubbing, rash, R and LLE no edema.  Neuro: Alert & orientedx3, cranial nerves grossly intact. moves all 4 extremities w/o difficulty. Affect pleasant   ASSESSMENT & PLAN:  1. Chronic systolic CHF: Ischemic cardiomyopathy. Echo 04/12/17 EF 15%, moderately dilated LV, inferior and inferolateral  akinesis, septal-lateral dyssynchrony (reviewed personally). EF lower compared to past (31% on 7/17 cardiac MRI).  - RHC/LHC with stable coronary disease and adequate cardiac output.  - Volume status much improved. Continue 40 mg torsemide daily. Discussed with him the importance of taking an extra torsemide if weight increases >3 pounds overnight.  - He has not taken any of his medications today. Will not increase any meds today.  - Continue Entresto 24/26 mg BID.  - Continue Spiro 25 mg daily. - Continue Coreg 3.25 mg BID.  - Continue digoxin 0.125 mg daily.  - Saw EP this week, plan for CRT-D placement with Dr. Curt Bears.   2. CKD stage II:  - BMET today. Creatinine improved with diuresis.   3. CAD: Coronary disease stable with patent grafts despite fall in EF.  - Continue statin and ASA 81 mg daily.   4. LBBB:  - Plan for CRT-D with Dr. Curt Bears.    5. DM: - Per PCP.   Volume stable, BMET today. Follow up in 2 months.   Arbutus Leas, NP 09/03/17

## 2017-09-13 ENCOUNTER — Encounter: Payer: Self-pay | Admitting: *Deleted

## 2017-09-13 ENCOUNTER — Telehealth: Payer: Self-pay | Admitting: Cardiology

## 2017-09-13 DIAGNOSIS — I255 Ischemic cardiomyopathy: Secondary | ICD-10-CM

## 2017-09-13 DIAGNOSIS — I5043 Acute on chronic combined systolic (congestive) and diastolic (congestive) heart failure: Secondary | ICD-10-CM

## 2017-09-13 DIAGNOSIS — Z01812 Encounter for preprocedural laboratory examination: Secondary | ICD-10-CM

## 2017-09-13 NOTE — Telephone Encounter (Signed)
ICD implant scheduled for 10/25. H&P and pre procedure labs scheduled for 10/22. Letter of instructions reviewed with patient and mailed to home address, per request. Patient verbalized understanding and agreeable to plan.

## 2017-09-13 NOTE — Addendum Note (Signed)
Addended by: Stanton Kidney on: 09/13/2017 02:03 PM   Modules accepted: Orders

## 2017-09-13 NOTE — Telephone Encounter (Signed)
New message    Pt is calling stating he does want to go through with having a device implanted. He would like a call back from RN to talk about this and scheduling procedure. Please call.

## 2017-09-28 ENCOUNTER — Other Ambulatory Visit: Payer: Self-pay | Admitting: *Deleted

## 2017-09-28 ENCOUNTER — Encounter: Payer: Self-pay | Admitting: *Deleted

## 2017-09-28 DIAGNOSIS — Z006 Encounter for examination for normal comparison and control in clinical research program: Secondary | ICD-10-CM

## 2017-09-28 MED ORDER — PANTOPRAZOLE SODIUM 40 MG PO TBEC: 40.0000 mg | DELAYED_RELEASE_TABLET | Freq: Every day | ORAL | 3 refills | Status: DC

## 2017-09-28 NOTE — Progress Notes (Signed)
RESEARCH ENCOUNTER  Patient ID: Patrick Stewart  DOB: March 17, 1956  Patrick Stewart presented to the [facility] for Week 24 visit of [facility].  Patrick Stewart states that he feels the best he has felt in a while.  Neither shortness of breath nor peripheral edema noted.  Subject complianct with IP, IP returned, and additional IP dispensed.    Blood pressure 138/74, pulse 79, resp. rate 16, weight 195 lb (88.5 kg), SpO2 100 %.  [facility].

## 2017-09-29 ENCOUNTER — Other Ambulatory Visit: Payer: Self-pay | Admitting: *Deleted

## 2017-09-29 MED ORDER — PANTOPRAZOLE SODIUM 40 MG PO TBEC: 40.0000 mg | DELAYED_RELEASE_TABLET | Freq: Every day | ORAL | 3 refills | Status: DC

## 2017-10-17 NOTE — Progress Notes (Signed)
Electrophysiology Office Note   Date:  10/18/2017   ID:  Patrick Stewart, DOB 03-16-1956, MRN [id number]  PCP:  Shelda Pal, DO  Cardiologist:  Aundra Dubin Primary Electrophysiologist:  Owais Pruett Meredith Leeds, MD    Chief Complaint  Patient presents with  . Cardiomyopathy    H&P and pre procedure labs for procedure 10/25     History of Present Illness: Patrick Stewart is a 61 y.o. male who is being seen today for the evaluation of CHF at the request of Shelda Pal*. Presenting today for electrophysiology evaluation. History of ischemic cardio myopathy with an EF 15%, CKG stage II, coronary artery disease status post CABG 2 in 2010 (SVG -> OM1, LIMA -> LAD), left bundle branch block, and type 2 diabetes. He is scheduled for CRT-D on 10/21/17.  Today, denies symptoms of palpitations, chest pain, orthopnea, PND, lower extremity edema, claudication, dizziness, presyncope, syncope, bleeding, or neurologic sequela. The patient is tolerating medications without difficulties. Test dyspnea on exertion, but otherwise feels well. He has not had chest pain or PND, orthopnea. He has no further complaints.    Past Medical History:  Diagnosis Date  . Angina at rest [facility]) 07/08/2016  . CAD (coronary artery disease) 2010   3v CABG  . Chest pain at rest 07/08/2016  . CHF (congestive heart failure) ([place]) 01/26/2012  . CKD (chronic kidney disease) 02/19/2012  . COPD with chronic bronchitis ([place]) 08/26/2016  . Diabetes mellitus type 2, uncontrolled ([place]) 01/26/2012  . Elevated liver function tests 01/26/2012  . Gout 01/26/2012  . Hx of CABG 2010   RCA Stent 2008, CABG x 2 [facility] 2010  . Hypercholesterolemia 07/08/2016  . Hypertension 09/04/2014  . Ischemic cardiomyopathy 11/15/2014  . Kidney stone 10/25/2015  . Lipid disorder 01/26/2012  . NSTEMI (non-ST elevated myocardial infarction) ([place]) 07/08/2016  . ST elevation myocardial infarction (STEMI) ([place]) 2010  . Unstable angina  ([place]) 11/14/2014   Past Surgical History:  Procedure Laterality Date  . CORONARY ARTERY BYPASS GRAFT  2010  . KNEE SURGERY    . RIGHT/LEFT HEART CATH AND CORONARY ANGIOGRAPHY N/A 04/15/2017   Procedure: Right/Left Heart Cath and Coronary Angiography;  Surgeon: Larey Dresser, MD;  Location: [facility];  Service: Cardiovascular;  Laterality: N/A;  . ROTATOR CUFF REPAIR    . WRIST SURGERY       Current Outpatient Prescriptions  Medication Sig Dispense Refill  . albuterol (PROVENTIL HFA;VENTOLIN HFA) 108 (90 Base) MCG/ACT inhaler Inhale 2 puffs into the lungs every 6 (six) hours as needed for wheezing or shortness of breath.    Marland Kitchen albuterol (PROVENTIL) (2.5 MG/3ML) 0.083% nebulizer solution Take 3 mLs (2.5 mg total) by nebulization every 6 (six) hours as needed for wheezing or shortness of breath. 75 mL 3  . allopurinol (ZYLOPRIM) 300 MG tablet Take 300 mg by mouth 2 (two) times daily.    Marland Kitchen aspirin EC 81 MG EC tablet Take 1 tablet (81 mg total) by mouth daily. 30 tablet 6  . atorvastatin (LIPITOR) 80 MG tablet Take 1 tablet (80 mg total) by mouth daily. 30 tablet 11  . carvedilol (COREG) 3.125 MG tablet Take 1 tablet (3.125 mg total) by mouth 2 (two) times daily. 60 tablet 6  . digoxin (LANOXIN) 0.125 MG tablet Take 1 tablet (0.125 mg total) by mouth daily. 30 tablet 6  . diphenhydrAMINE (BENADRYL) 25 MG tablet Take 25 mg by mouth daily as needed for itching.    Marland Kitchen glucose blood test  strip Use as instructed 100 each 3  . Investigational - Study Medication Take 1 tablet by mouth 2 (two) times daily. Study name: Galactic HF Study Additional study details: Omecamtiv Mecarbil or Placebo 1 each PRN  . isosorbide dinitrate (ISORDIL) 20 MG tablet Take 20 mg by mouth daily.    . isosorbide-hydrALAZINE (BIDIL) 20-37.5 MG tablet Take 0.5 tablets by mouth 3 (three) times daily. 30 tablet 0  . Lancets (FREESTYLE) lancets Use to check sugars daily 100 each 3  . metFORMIN (GLUCOPHAGE) 500 MG tablet  Take 500 mg by mouth 2 (two) times daily with a meal.    . naproxen sodium (ANAPROX) 220 MG tablet Take 440 mg by mouth daily as needed (pain).    . pantoprazole (PROTONIX) 40 MG tablet Take 1 tablet (40 mg total) by mouth daily. 30 tablet 3  . sacubitril-valsartan (ENTRESTO) 24-26 MG Take 1 tablet by mouth 2 (two) times daily. 60 tablet 6  . spironolactone (ALDACTONE) 25 MG tablet Take 12.5 mg by mouth daily.     Marland Kitchen torsemide (DEMADEX) 20 MG tablet Take 2 tablets (40 mg total) by mouth daily. (Patient taking differently: Take 20 mg by mouth 2 (two) times daily. ) 60 tablet 0   No current facility-administered medications for this visit.     Allergies:   Hydrocodone and Tramadol   Social History:  The patient  reports that he quit smoking about 15 months ago. He has a 15.00 pack-year smoking history. He has never used smokeless tobacco. He reports that he does not drink alcohol or use drugs.   Family History:  The patient's family history includes Diabetes in his mother; Emphysema in his father; Hyperlipidemia in his mother; Hypertension in his mother; Kidney failure in his mother.    ROS:  Please see the history of present illness.   Otherwise, review of systems is positive for DOE.   All other systems are reviewed and negative.   PHYSICAL EXAM: VS:  BP (!) 152/92   Pulse 74   Ht 5\' 10"  (1.778 m)   Wt 200 lb (90.7 kg)   BMI 28.70 kg/m  , BMI Body mass index is 28.7 kg/m. GEN: Well nourished, well developed, in no acute distress  HEENT: normal  Neck: no JVD, carotid bruits, or masses Cardiac: RRR; no murmurs, rubs, or gallops,no edema  Respiratory:  clear to auscultation bilaterally, normal work of breathing GI: soft, nontender, nondistended, + BS MS: no deformity or atrophy  Skin: warm and dry Neuro:  Strength and sensation are intact Psych: euthymic mood, full affect  EKG:  EKG is not ordered today. Personal review of the ekg ordered 07/29/17 shows SR, rate 78, LBBB, QRS 176  msec.  Recent Labs: 05/25/2017: ALT 11 07/14/2017: Magnesium 1.6 08/20/2017: B Natriuretic Peptide 1,190.6; Hemoglobin 12.5; Platelets 167 09/03/2017: BUN 28; Creatinine, Ser 1.87; Potassium 3.9; Sodium 139    Lipid Panel     Component Value Date/Time   CHOL 194 07/28/2017 0855   TRIG 176 (H) 07/28/2017 0855   HDL 29 (L) 07/28/2017 0855   CHOLHDL 6.7 07/28/2017 0855   VLDL 35 07/28/2017 0855   LDLCALC 130 (H) 07/28/2017 0855   LDLDIRECT 138.0 08/26/2016 0948     Wt Readings from Last 3 Encounters:  10/18/17 200 lb (90.7 kg)  09/28/17 195 lb (88.5 kg)  09/03/17 198 lb 8 oz (90 kg)      Other studies Reviewed: Additional studies/ records that were reviewed today include: TTE 07/13/17  Review  of the above records today demonstrates:  - Left ventricle: The cavity size was moderately dilated. Wall   thickness was increased in a pattern of mild LVH. Systolic   function was severely reduced. The estimated ejection fraction   was in the range of 20% to 25%. Diffuse hypokinesis. There is   akinesis of the inferior myocardium. - Ventricular septum: Septal motion showed abnormal function and   dyssynergy. - Aortic valve: There was moderate regurgitation. - Aorta: Aortic root dimension: 39 mm (ED). - Ascending aorta: The ascending aorta was mildly dilated. - Mitral valve: There was mild regurgitation. Valve area by   pressure half-time: 1.35 cm^2. - Left atrium: The atrium was moderately dilated. - Pulmonary arteries: Systolic pressure was moderately to severely   increased. PA peak pressure: 61 mm Hg (S).  LHC/RHC 04/15/17 Left Main  30% stenosis.  Left Anterior Descending  90% mid LAD stenosis. Patent LIMA-LAD.  Left Circumflex  Moderate high OM1 with luminal irregularities. Small caliber distal LCx with moderate diffuse disease. 70-80% stenosis proximally in moderate OM2. Patent SVG-OM2.  Right Coronary Artery  30% proximal and mid RCA stenoses.    ASSESSMENT AND  PLAN:  1.  Chronic systolic heart failure due to ischemic cardiomyopathy: On optimal medical therapy with coreg, Entresto, aldactone. CRT-D scheduled for 10/21/17. He has no further questions about the procedure. Risks and benefits have been previously reviewed. Risks include bleeding, tamponade, infection, and pneumothorax among others.  2. CKD stage II: Has been stable. Checking labs preprocedure today.  3. Coronary artery disease status post CABG: Stable coronary disease on most recent cath. No further changes. No current chest pain.  4. Left bundle branch block: Has dyssynchrony on his echocardiogram. Plan for CRT implant.    Current medicines are reviewed at length with the patient today.   The patient does not have concerns regarding his medicines.  The following changes were made today:  none  Labs/ tests ordered today include: CBC, BMP No orders of the defined types were placed in this encounter.    Disposition:   FU with Abdullahi Vallone 3 months  Signed, Daneka Lantigua Meredith Leeds, MD  10/18/2017 8:43 AM     [facility] [address] [place] [address] [phone] (office) [phone] (fax)

## 2017-10-18 ENCOUNTER — Ambulatory Visit (INDEPENDENT_AMBULATORY_CARE_PROVIDER_SITE_OTHER): Payer: Medicare Other | Admitting: Cardiology

## 2017-10-18 ENCOUNTER — Encounter: Payer: Self-pay | Admitting: Cardiology

## 2017-10-18 VITALS — BP 152/92 | HR 74 | Ht 70.0 in | Wt 200.0 lb

## 2017-10-18 DIAGNOSIS — Z01812 Encounter for preprocedural laboratory examination: Secondary | ICD-10-CM | POA: Diagnosis not present

## 2017-10-18 DIAGNOSIS — I5022 Chronic systolic (congestive) heart failure: Secondary | ICD-10-CM

## 2017-10-18 DIAGNOSIS — I5043 Acute on chronic combined systolic (congestive) and diastolic (congestive) heart failure: Secondary | ICD-10-CM

## 2017-10-18 DIAGNOSIS — I255 Ischemic cardiomyopathy: Secondary | ICD-10-CM | POA: Diagnosis not present

## 2017-10-18 DIAGNOSIS — I25810 Atherosclerosis of coronary artery bypass graft(s) without angina pectoris: Secondary | ICD-10-CM | POA: Diagnosis not present

## 2017-10-18 DIAGNOSIS — I447 Left bundle-branch block, unspecified: Secondary | ICD-10-CM

## 2017-10-18 LAB — CBC WITH DIFFERENTIAL/PLATELET
Basophils Absolute: 0 x10E3/uL (ref 0.0–0.2)
Basos: 1 %
EOS (ABSOLUTE): 0.2 x10E3/uL (ref 0.0–0.4)
Eos: 5 %
Hematocrit: 37.6 % (ref 37.5–51.0)
Hemoglobin: 12.3 g/dL — ABNORMAL LOW (ref 13.0–17.7)
Immature Grans (Abs): 0 x10E3/uL (ref 0.0–0.1)
Immature Granulocytes: 0 %
Lymphocytes Absolute: 1.7 x10E3/uL (ref 0.7–3.1)
Lymphs: 32 %
MCH: 29.5 pg (ref 26.6–33.0)
MCHC: 32.7 g/dL (ref 31.5–35.7)
MCV: 90 fL (ref 79–97)
Monocytes Absolute: 0.6 x10E3/uL (ref 0.1–0.9)
Monocytes: 12 %
Neutrophils Absolute: 2.6 x10E3/uL (ref 1.4–7.0)
Neutrophils: 50 %
Platelets: 245 x10E3/uL (ref 150–379)
RBC: 4.17 x10E6/uL (ref 4.14–5.80)
RDW: 15.6 % — ABNORMAL HIGH (ref 12.3–15.4)
WBC: 5.2 x10E3/uL (ref 3.4–10.8)

## 2017-10-18 LAB — BASIC METABOLIC PANEL
BUN / CREAT RATIO: 19 (ref 10–24)
BUN: 37 mg/dL — ABNORMAL HIGH (ref 8–27)
CALCIUM: 9 mg/dL (ref 8.6–10.2)
CHLORIDE: 96 mmol/L (ref 96–106)
CO2: 27 mmol/L (ref 20–29)
Creatinine, Ser: 1.96 mg/dL — ABNORMAL HIGH (ref 0.76–1.27)
GFR, EST AFRICAN AMERICAN: 41 mL/min/{1.73_m2} — AB (ref >59)
GFR, EST NON AFRICAN AMERICAN: 36 mL/min/{1.73_m2} — AB (ref >59)
Glucose: 148 mg/dL — ABNORMAL HIGH (ref 65–99)
Potassium: 4 mmol/L (ref 3.5–5.2)
SODIUM: 140 mmol/L (ref 134–144)

## 2017-10-18 NOTE — Addendum Note (Signed)
Addended by: Eulis Foster on: 10/18/2017 08:55 AM   Modules accepted: Orders

## 2017-10-18 NOTE — Patient Instructions (Signed)
Medication Instructions:    Your physician recommends that you continue on your current medications as directed. Please refer to the Current Medication list given to you today.  Labwork:  Today, pre procedure labs:  BMET & CBC w/ diff  Testing/Procedures: Your physician has recommended that you have a defibrillator inserted. An implantable cardioverter defibrillator (ICD) is a small device that is placed in your chest or, in rare cases, your abdomen. This device uses electrical pulses or shocks to help control life-threatening, irregular heartbeats that could lead the heart to suddenly stop beating (sudden cardiac arrest). Leads are attached to the ICD that goes into your heart. This is done in the hospital and usually requires an overnight stay. Please see the instruction sheet given to you today for more information.  Follow-Up:  Your physician recommends that you schedule a wound check appointment 10-14 days, after your procedure on 10/21/2017, with the device clinic.   Your physician recommends that you schedule a follow up appointment in 91 days, after your procedure on 10/21/2017, with Dr. Curt Bears.  --- If you need a refill on your cardiac medications before your next appointment, please call your pharmacy. ---  Thank you for choosing [facility]!!   Trinidad Curet, RN [phone]  Any Other Special Instructions Will Be Listed Below (If Applicable).  Cardioverter Defibrillator Implantation An implantable cardioverter defibrillator (ICD) is a small, lightweight, battery-powered device that is placed (implanted) under the skin in the chest or abdomen. Your caregiver may prescribe an ICD if:  You have had an irregular heart rhythm (arrhythmia) that originated in the lower chambers of the heart (ventricles).  Your heart has been damaged by a disease (such as coronary artery disease) or heart condition (such as a heart attack). An ICD consists of a battery that lasts several  years, a small computer called a pulse generator, and wires called leads that go into the heart. It is used to detect and correct two dangerous arrhythmias: a rapid heart rhythm (tachycardia) and an arrhythmia in which the ventricles contract in an uncoordinated way (fibrillation). When an ICD detects tachycardia, it sends an electrical signal to the heart that restores the heartbeat to normal (cardioversion). This signal is usually painless. If cardioversion does not work or if the ICD detects fibrillation, it delivers a small electrical shock to the heart (defibrillation) to restart the heart. The shock may feel like a strong jolt in the chest.ICDs may be programmed to correct other problems. Sometimes, ICDs are programmed to act as another type of implantable device called a pacemaker. Pacemakers are used to treat a slow heartbeat (bradycardia). LET YOUR CAREGIVER KNOW ABOUT:  Any allergies you have.  All medicines you are taking, including vitamins, herbs, eyedrops, and over-the-counter medicines and creams.  Previous problems you or members of your family have had with the use of anesthetics.  Any blood disorders you have had.  Other health problems you have. RISKS AND COMPLICATIONS Generally, the procedure to implant an ICD is safe. However, as with any surgical procedure, complications can occur. Possible complications associated with implanting an ICD include:  Swelling, bleeding, or bruising at the site where the ICD was implanted.  Infection at the site where the ICD was implanted.  A reaction to medicine used during the procedure.  Nerve, heart, or blood vessel damage.  Blood clots. BEFORE THE PROCEDURE  You may need to have blood tests, heart tests, or a chest X-ray done before the day of the procedure.  Ask your  caregiver about changing or stopping your regular medicines.  Make plans to have someone drive you home. You may need to stay in the hospital overnight after the  procedure.  Stop smoking at least 24 hours before the procedure.  Take a bath or shower the night before the procedure. You may need to scrub your chest or abdomen with a special type of soap.  Do not eat or drink before your procedure for as long as directed by your caregiver. Ask if it is okay to take any needed medicine with a small sip of water. PROCEDURE  The procedure to implant an ICD in your chest or abdomen is usually done at a hospital in a room that has a large X-ray machine called a fluoroscope. The machine will be above you during the procedure. It will help your caregiver see your heart during the procedure. Implanting an ICD usually takes 1-3 hours. Before the procedure:   Small monitors will be put on your body. They will be used to check your heart, blood pressure, and oxygen level.  A needle will be put into a vein in your hand or arm. This is called an intravenous (IV) access tube. Fluids and medicine will flow directly into your body through the IV tube.  Your chest or abdomen will be cleaned with a germ-killing (antiseptic) solution. The area may be shaved.  You may be given medicine to help you relax (sedative).  You will be given a medicine called a local anesthetic. This medicine will make the surgical site numb while the ICD is implanted. You will be sleepy but awake during the procedure. After you are numb the procedure will begin. The caregiver will:  Make a small cut (incision). This will make a pocket deep under your skin that will hold the pulse generator.  Guide the leads through a large blood vessel into your heart and attach them to the heart muscles. Depending on the ICD, the leads may go into one ventricle or they may go to both ventricles and into an upper chamber of the heart (atrium).  Test the ICD.  Close the incision with stitches, glue, or staples. AFTER THE PROCEDURE  You may feel pain. Some pain is normal. It may last a few days.  You may  stay in a recovery area until the local anesthetic has worn off. Your blood pressure and pulse will be checked often. You will be taken to a room where your heart will be monitored.  A chest X-ray will be taken. This is done to check that the cardioverter defibrillator is in the right place.  You may stay in the hospital overnight.  A slight bump may be seen over the skin where the ICD was placed. Sometimes, it is possible to feel the ICD under the skin. This is normal.  In the months and years afterward, your caregiver will check the device, the leads, and the battery every few months. Eventually, when the battery is low, the ICD will be replaced.   This information is not intended to replace advice given to you by your health care provider. Make sure you discuss any questions you have with your health care provider.   Document Released: 09/05/2002 Document Revised: 10/04/2013 Document Reviewed: 01/02/2013 Elsevier Interactive Patient Education [employer].

## 2017-10-21 ENCOUNTER — Ambulatory Visit (HOSPITAL_COMMUNITY)
Admission: RE | Disposition: A | Discharge status: Home / Self Care | Payer: Self-pay | Source: Ambulatory Visit | Attending: Cardiology

## 2017-10-21 ENCOUNTER — Encounter (HOSPITAL_COMMUNITY): Payer: Self-pay | Admitting: Cardiology

## 2017-10-21 ENCOUNTER — Ambulatory Visit (HOSPITAL_COMMUNITY)
Admission: RE | Admit: 2017-10-21 | Discharge: 2017-10-22 | Disposition: A | Discharge status: Home / Self Care | Payer: Medicare Other | Source: Ambulatory Visit | Attending: Cardiology | Admitting: Cardiology

## 2017-10-21 DIAGNOSIS — I252 Old myocardial infarction: Secondary | ICD-10-CM | POA: Insufficient documentation

## 2017-10-21 DIAGNOSIS — I2510 Atherosclerotic heart disease of native coronary artery without angina pectoris: Secondary | ICD-10-CM | POA: Insufficient documentation

## 2017-10-21 DIAGNOSIS — I130 Hypertensive heart and chronic kidney disease with heart failure and stage 1 through stage 4 chronic kidney disease, or unspecified chronic kidney disease: Secondary | ICD-10-CM | POA: Diagnosis not present

## 2017-10-21 DIAGNOSIS — J449 Chronic obstructive pulmonary disease, unspecified: Secondary | ICD-10-CM | POA: Insufficient documentation

## 2017-10-21 DIAGNOSIS — N182 Chronic kidney disease, stage 2 (mild): Secondary | ICD-10-CM | POA: Insufficient documentation

## 2017-10-21 DIAGNOSIS — I5022 Chronic systolic (congestive) heart failure: Secondary | ICD-10-CM | POA: Diagnosis not present

## 2017-10-21 DIAGNOSIS — I447 Left bundle-branch block, unspecified: Secondary | ICD-10-CM | POA: Diagnosis not present

## 2017-10-21 DIAGNOSIS — I255 Ischemic cardiomyopathy: Secondary | ICD-10-CM | POA: Insufficient documentation

## 2017-10-21 DIAGNOSIS — E1122 Type 2 diabetes mellitus with diabetic chronic kidney disease: Secondary | ICD-10-CM | POA: Insufficient documentation

## 2017-10-21 DIAGNOSIS — Z95818 Presence of other cardiac implants and grafts: Secondary | ICD-10-CM

## 2017-10-21 DIAGNOSIS — I428 Other cardiomyopathies: Secondary | ICD-10-CM

## 2017-10-21 DIAGNOSIS — I509 Heart failure, unspecified: Secondary | ICD-10-CM

## 2017-10-21 HISTORY — PX: BIV ICD INSERTION CRT-D: EP1195

## 2017-10-21 LAB — GLUCOSE, CAPILLARY
GLUCOSE-CAPILLARY: 142 mg/dL — AB (ref 65–99)
GLUCOSE-CAPILLARY: 164 mg/dL — AB (ref 65–99)
Glucose-Capillary: 188 mg/dL — ABNORMAL HIGH (ref 65–99)
Glucose-Capillary: 179 mg/dL — ABNORMAL HIGH (ref 65–99)

## 2017-10-21 LAB — SURGICAL PCR SCREEN
MRSA, PCR: POSITIVE — AB
STAPHYLOCOCCUS AUREUS: POSITIVE — AB

## 2017-10-21 SURGERY — BIV ICD INSERTION CRT-D

## 2017-10-21 MED ORDER — LIDOCAINE HCL 2 % IJ SOLN
INTRAMUSCULAR | Status: AC
  Filled 2017-10-21: qty 20

## 2017-10-21 MED ORDER — ISOSORBIDE DINITRATE 10 MG PO TABS
20.0000 mg | ORAL_TABLET | Freq: Every day | ORAL | Status: DC
  Administered 2017-10-21 – 2017-10-22 (×2): 20 mg via ORAL
  Filled 2017-10-21 (×2): qty 2

## 2017-10-21 MED ORDER — IOPAMIDOL (ISOVUE-370) INJECTION 76%
INTRAVENOUS | Status: AC
  Filled 2017-10-21: qty 50

## 2017-10-21 MED ORDER — DIPHENHYDRAMINE HCL 25 MG PO TABS
25.0000 mg | ORAL_TABLET | Freq: Every day | ORAL | Status: DC | PRN
  Filled 2017-10-21: qty 1

## 2017-10-21 MED ORDER — HEPARIN (PORCINE) IN NACL 2-0.9 UNIT/ML-% IJ SOLN
INTRAMUSCULAR | Status: AC
  Filled 2017-10-21: qty 1000

## 2017-10-21 MED ORDER — NAPROXEN SODIUM 275 MG PO TABS
440.0000 mg | ORAL_TABLET | Freq: Every day | ORAL | Status: DC | PRN
  Filled 2017-10-21: qty 2

## 2017-10-21 MED ORDER — PANTOPRAZOLE SODIUM 40 MG PO TBEC
40.0000 mg | DELAYED_RELEASE_TABLET | Freq: Every day | ORAL | Status: DC
  Administered 2017-10-21 – 2017-10-22 (×2): 40 mg via ORAL
  Filled 2017-10-21 (×2): qty 1

## 2017-10-21 MED ORDER — SODIUM CHLORIDE 0.9 % IV SOLN
INTRAVENOUS | Status: DC
  Administered 2017-10-21: 07:00:00 via INTRAVENOUS

## 2017-10-21 MED ORDER — GENTAMICIN SULFATE 40 MG/ML IJ SOLN
INTRAMUSCULAR | Status: AC
  Filled 2017-10-21: qty 2

## 2017-10-21 MED ORDER — ISOSORB DINITRATE-HYDRALAZINE 20-37.5 MG PO TABS: 0.5000 | ORAL_TABLET | Freq: Three times a day (TID) | ORAL | Status: DC

## 2017-10-21 MED ORDER — ALLOPURINOL 300 MG PO TABS
300.0000 mg | ORAL_TABLET | Freq: Two times a day (BID) | ORAL | Status: DC
  Administered 2017-10-21 – 2017-10-22 (×3): 300 mg via ORAL
  Filled 2017-10-21 (×3): qty 1

## 2017-10-21 MED ORDER — CEFAZOLIN SODIUM-DEXTROSE 1-4 GM/50ML-% IV SOLN
1.0000 g | Freq: Four times a day (QID) | INTRAVENOUS | Status: AC
  Administered 2017-10-21 – 2017-10-22 (×3): 1 g via INTRAVENOUS
  Filled 2017-10-21 (×3): qty 50

## 2017-10-21 MED ORDER — DIGOXIN 125 MCG PO TABS
0.1250 mg | ORAL_TABLET | Freq: Every day | ORAL | Status: DC
  Administered 2017-10-21 – 2017-10-22 (×2): 0.125 mg via ORAL
  Filled 2017-10-21 (×2): qty 1

## 2017-10-21 MED ORDER — ASPIRIN EC 81 MG PO TBEC
81.0000 mg | DELAYED_RELEASE_TABLET | Freq: Every day | ORAL | Status: DC
  Administered 2017-10-21 – 2017-10-22 (×2): 81 mg via ORAL
  Filled 2017-10-21 (×2): qty 1

## 2017-10-21 MED ORDER — SACUBITRIL-VALSARTAN 24-26 MG PO TABS
1.0000 | ORAL_TABLET | Freq: Two times a day (BID) | ORAL | Status: DC
  Administered 2017-10-21 – 2017-10-22 (×3): 1 via ORAL
  Filled 2017-10-21 (×3): qty 1

## 2017-10-21 MED ORDER — ONDANSETRON HCL 4 MG/2ML IJ SOLN
4.0000 mg | Freq: Four times a day (QID) | INTRAMUSCULAR | Status: DC | PRN
Start: 2017-10-21 — End: 2017-10-22

## 2017-10-21 MED ORDER — ALBUTEROL SULFATE (2.5 MG/3ML) 0.083% IN NEBU: 2.5000 mg | INHALATION_SOLUTION | Freq: Four times a day (QID) | RESPIRATORY_TRACT | Status: DC | PRN

## 2017-10-21 MED ORDER — CARVEDILOL 3.125 MG PO TABS
3.1250 mg | ORAL_TABLET | Freq: Two times a day (BID) | ORAL | Status: DC
  Administered 2017-10-21 – 2017-10-22 (×3): 3.125 mg via ORAL
  Filled 2017-10-21 (×3): qty 1

## 2017-10-21 MED ORDER — ALBUTEROL SULFATE HFA 108 (90 BASE) MCG/ACT IN AERS: 2.0000 | INHALATION_SPRAY | Freq: Four times a day (QID) | RESPIRATORY_TRACT | Status: DC | PRN

## 2017-10-21 MED ORDER — INSULIN ASPART 100 UNIT/ML ~~LOC~~ SOLN
0.0000 [IU] | SUBCUTANEOUS | Status: DC
Start: 2017-10-21 — End: 2017-10-22
  Administered 2017-10-21: 2 [IU] via SUBCUTANEOUS
  Administered 2017-10-21 – 2017-10-22 (×3): 3 [IU] via SUBCUTANEOUS
  Administered 2017-10-22: 2 [IU] via SUBCUTANEOUS
  Administered 2017-10-22: 3 [IU] via SUBCUTANEOUS

## 2017-10-21 MED ORDER — CEFAZOLIN SODIUM-DEXTROSE 2-4 GM/100ML-% IV SOLN
2.0000 g | INTRAVENOUS | Status: AC
  Administered 2017-10-21: 2 g via INTRAVENOUS
  Filled 2017-10-21: qty 100

## 2017-10-21 MED ORDER — FENTANYL CITRATE (PF) 100 MCG/2ML IJ SOLN
INTRAMUSCULAR | Status: AC
  Filled 2017-10-21: qty 2

## 2017-10-21 MED ORDER — ATORVASTATIN CALCIUM 80 MG PO TABS
80.0000 mg | ORAL_TABLET | Freq: Every day | ORAL | Status: DC
  Administered 2017-10-21: 80 mg via ORAL
  Filled 2017-10-21: qty 1

## 2017-10-21 MED ORDER — MUPIROCIN 2 % EX OINT
TOPICAL_OINTMENT | Freq: Two times a day (BID) | CUTANEOUS | Status: DC
  Administered 2017-10-21: 17:00:00 via NASAL
  Administered 2017-10-21: 1 via NASAL
  Administered 2017-10-22: 09:00:00 via NASAL

## 2017-10-21 MED ORDER — ACETAMINOPHEN 325 MG PO TABS
325.0000 mg | ORAL_TABLET | ORAL | Status: DC | PRN
  Administered 2017-10-21: 650 mg via ORAL
  Filled 2017-10-21: qty 2

## 2017-10-21 MED ORDER — MUPIROCIN 2 % EX OINT
TOPICAL_OINTMENT | CUTANEOUS | Status: AC
  Administered 2017-10-21: 07:00:00
  Filled 2017-10-21: qty 22

## 2017-10-21 MED ORDER — HEPARIN (PORCINE) IN NACL 2-0.9 UNIT/ML-% IJ SOLN
INTRAMUSCULAR | Status: AC | PRN
  Administered 2017-10-21: 500 mL

## 2017-10-21 MED ORDER — IOPAMIDOL (ISOVUE-370) INJECTION 76%
INTRAVENOUS | Status: DC | PRN
  Administered 2017-10-21: 25 mL via INTRAVENOUS
  Administered 2017-10-21: 15 mL via INTRAVENOUS

## 2017-10-21 MED ORDER — SPIRONOLACTONE 25 MG PO TABS
12.5000 mg | ORAL_TABLET | Freq: Every day | ORAL | Status: DC
  Administered 2017-10-21 – 2017-10-22 (×2): 12.5 mg via ORAL
  Filled 2017-10-21 (×2): qty 1

## 2017-10-21 MED ORDER — TORSEMIDE 20 MG PO TABS
20.0000 mg | ORAL_TABLET | Freq: Two times a day (BID) | ORAL | Status: DC
  Administered 2017-10-21 – 2017-10-22 (×2): 20 mg via ORAL
  Filled 2017-10-21 (×2): qty 1

## 2017-10-21 MED ORDER — MIDAZOLAM HCL 5 MG/5ML IJ SOLN
INTRAMUSCULAR | Status: DC | PRN
Start: 2017-10-21 — End: 2017-10-21
  Administered 2017-10-21 (×5): 1 mg via INTRAVENOUS

## 2017-10-21 MED ORDER — SODIUM CHLORIDE 0.9 % IR SOLN
80.0000 mg | Status: AC
  Administered 2017-10-21: 80 mg
  Filled 2017-10-21: qty 2

## 2017-10-21 MED ORDER — NAPROXEN 500 MG PO TABS
500.0000 mg | ORAL_TABLET | Freq: Every day | ORAL | Status: DC | PRN
  Filled 2017-10-21: qty 1

## 2017-10-21 MED ORDER — LIDOCAINE HCL 2 % IJ SOLN
INTRAMUSCULAR | Status: DC | PRN
  Administered 2017-10-21: 45 mL

## 2017-10-21 MED ORDER — CEFAZOLIN SODIUM-DEXTROSE 2-4 GM/100ML-% IV SOLN
INTRAVENOUS | Status: AC
  Filled 2017-10-21: qty 100

## 2017-10-21 MED ORDER — MIDAZOLAM HCL 5 MG/5ML IJ SOLN
INTRAMUSCULAR | Status: AC
  Filled 2017-10-21: qty 5

## 2017-10-21 MED ORDER — FENTANYL CITRATE (PF) 100 MCG/2ML IJ SOLN
INTRAMUSCULAR | Status: DC | PRN
  Administered 2017-10-21 (×4): 25 ug via INTRAVENOUS

## 2017-10-21 MED ORDER — CHLORHEXIDINE GLUCONATE 4 % EX LIQD
60.0000 mL | Freq: Once | CUTANEOUS | Status: DC
  Filled 2017-10-21: qty 60

## 2017-10-21 SURGICAL SUPPLY — 23 items
BALLN ATTAIN 80 (BALLOONS) ×2
BALLN ATTAIN 80CM 6215 (BALLOONS) ×1
BALLOON ATTAIN 80 (BALLOONS) ×1 IMPLANT
CABLE SURGICAL S-101-97-12 (CABLE) ×3 IMPLANT
CATH ATTAIN COM SUR 6250V-MB2X (CATHETERS) ×3 IMPLANT
CATH ATTAIN COM SURV 6250V-EH (CATHETERS) ×3 IMPLANT
CATH ATTAIN COM SURV 6250V-MB2 (CATHETERS) ×3 IMPLANT
CATH ATTAIN SEL SURV 6248V-130 (CATHETERS) ×3 IMPLANT
CATH HEX JOSEPH 2-5-2 65CM 6F (CATHETERS) ×3 IMPLANT
ICD CLARIA MRI DTMA1QQ (ICD Generator) ×3 IMPLANT
KIT ESSENTIALS PG (KITS) ×3 IMPLANT
LEAD ATTAIN PERFORM ST 4398-88 (Lead) ×3 IMPLANT
LEAD CAPSURE NOVUS 5076-52CM (Lead) ×3 IMPLANT
LEAD SPRINT QUAT SEC 6935M-62 (Lead) ×3 IMPLANT
PAD DEFIB LIFELINK (PAD) ×3 IMPLANT
SHEATH CLASSIC 7F (SHEATH) ×3 IMPLANT
SHEATH CLASSIC 9.5F (SHEATH) ×3 IMPLANT
SHEATH CLASSIC 9F (SHEATH) ×3 IMPLANT
SLITTER UNIVERSAL DS2A003 (MISCELLANEOUS) ×6 IMPLANT
TRAY PACEMAKER INSERTION (PACKS) ×3 IMPLANT
WIRE ACUITY WHISPER EDS 4648 (WIRE) ×3 IMPLANT
WIRE AMPLATZ SS-J .035X180CM (WIRE) ×3 IMPLANT
WIRE HI TORQ VERSACORE-J 145CM (WIRE) ×6 IMPLANT

## 2017-10-21 NOTE — Discharge Instructions (Signed)
° ° °  Supplemental Discharge Instructions for  Pacemaker/Defibrillator Patients  Activity No heavy lifting or vigorous activity with your left/right arm for 6 to 8 weeks.  Do not raise your left/right arm above your head for one week.  Gradually raise your affected arm as drawn below.             10/26/17                   10/27/17                 10/28/17                    10/29/17 __  NO DRIVING for 1 week  ; you may begin driving on  76/2/26   .  WOUND CARE - Keep the wound area clean and dry.  Do not get this area wet, no showersuntil cleared to at your wound check visit  . - The tape/steri-strips on your wound will fall off; do not pull them off.  No bandage is needed on the site.  DO  NOT apply any creams, oils, or ointments to the wound area. - If you notice any drainage or discharge from the wound, any swelling or bruising at the site, or you develop a fever > 101? F after you are discharged home, call the office at once.  Special Instructions - You are still able to use cellular telephones; use the ear opposite the side where you have your pacemaker/defibrillator.  Avoid carrying your cellular phone near your device. - When traveling through airports, show security personnel your identification card to avoid being screened in the metal detectors.  Ask the security personnel to use the hand wand. - Avoid arc welding equipment, MRI testing (magnetic resonance imaging), TENS units (transcutaneous nerve stimulators).  Call the office for questions about other devices. - Avoid electrical appliances that are in poor condition or are not properly grounded. - Microwave ovens are safe to be near or to operate.  Additional information for defibrillator patients should your device go off: - If your device goes off ONCE and you feel fine afterward, notify the device clinic nurses. - If your device goes off ONCE and you do not feel well afterward, call 911. - If your device goes off TWICE, call  911. - If your device goes off THREE times in one day, call 911.  DO NOT DRIVE YOURSELF OR A FAMILY MEMBER WITH A DEFIBRILLATOR TO THE HOSPITAL--CALL 911.

## 2017-10-21 NOTE — Discharge Summary (Signed)
ELECTROPHYSIOLOGY PROCEDURE DISCHARGE SUMMARY    Patient ID: Patrick Stewart,  MRN: [id number], DOB/AGE: 62-Apr-1957 62 y.o.  Admit date: 10/21/2017 Discharge date: 10/22/17  Primary Care Physician: Shelda Pal, DO  Primary Cardiologist: Dr. Aundra Dubin Electrophysiologist: Dr. Curt Bears  Primary Discharge Diagnosis:  1. ICM 2. Chronic CHF (systolic)  Secondary Discharge Diagnosis:  1. CAD 2. COPD 3. DM 4. HTN 5. CRI (II) 6. LBBB  Allergies  Allergen Reactions  . Hydrocodone Itching  . Tramadol Nausea Only     Procedures This Admission:  1.  Implantation of a MDT CRT-D on 10/21/17 by Dr Curt Bears.  The patient received a Medtronic model [id number] (serial # [id number] ) right atrial lead and a Medtronic model [id number] (serial number [id number]) right ventricular defibrillator lead, Medtronic model 4598 - 88 (serial number [id number]) lead (LV), Medtronic model Claria MRI Quad CRT-D SureScan (serial Number [id number] ) device DFT's were deferred at time of implant.   There were no immediate post procedure complications. 2.  CXR on 10/22/17 demonstrated no pneumothorax status post device implantation.   Brief HPI: Patrick Stewart is a 61 y.o. male was referred to electrophysiology in the outpatient setting for consideration of ICD implantation.  Past medical history is noted above.  The patient has persistent LV dysfunction despite guideline directed therapy.  Risks, benefits, and alternatives to ICD implantation were reviewed with the patient who wished to proceed.   Hospital Course:  The patient was admitted and underwent implantation of a CRT-D with details as outlined above. He was monitored on telemetry overnight which demonstrated SR/V pacing.  Left chest was without hematoma or ecchymosis.  The device was interrogated and found to be functioning normally.  CXR was obtained and demonstrated no pneumothorax status post device implantation.  Wound care, arm mobility, and  restrictions were reviewed with the patient.  The patient was examined by Dr. Curt Bears and considered stable for discharge to home.   The patient's discharge medications include an ARB (entresto) and beta blocker (carvedilol).   Physical Exam: Vitals:   10/21/17 1110 10/21/17 2041 10/22/17 0009 10/22/17 0408  BP: (!) 159/85 (!) 143/79 (!) 159/79 (!) 146/83  Pulse: 78 77 82 73  Resp: 20 20 20 18   Temp: 97.8 F (36.6 C) 99.1 F (37.3 C) 99.1 F (37.3 C) 98.6 F (37 C)  TempSrc: Oral Oral Oral Oral  SpO2: 92% 97% 97% 96%  Weight: 198 lb 3.1 oz (89.9 kg)   197 lb 6.4 oz (89.5 kg)  Height: 5\' 10"  (1.778 m)       GEN- The patient is well appearing, alert and oriented x 3 today.   HEENT: normocephalic, atraumatic; sclera clear, conjunctiva pink; hearing intact; oropharynx clear Lungs- CTA b/l, normal work of breathing.  No wheezes, rales, rhonchi Heart- RRR, no murmurs, rubs or gallops, PMI not laterally displaced GI- soft, non-tender, non-distended Extremities- no clubbing, cyanosis, or edema MS- no significant deformity or atrophy Skin- warm and dry, no rash or lesion, left chest without hematoma/ecchymosis Psych- euthymic mood, full affect Neuro- no gross defecits  Labs:   Lab Results  Component Value Date   WBC 5.2 10/18/2017   HGB 12.3 (L) 10/18/2017   HCT 37.6 10/18/2017   MCV 90 10/18/2017   PLT 245 10/18/2017     Recent Labs Lab 10/18/17 0855  NA 140  K 4.0  CL 96  CO2 27  BUN 37*  CREATININE 1.96*  CALCIUM 9.0  GLUCOSE 148*    Discharge Medications:  Allergies as of 10/22/2017      Reactions   Hydrocodone Itching   Tramadol Nausea Only      Medication List    STOP taking these medications   isosorbide dinitrate 20 MG tablet Commonly known as:  ISORDIL     TAKE these medications   albuterol 108 (90 Base) MCG/ACT inhaler Commonly known as:  PROVENTIL HFA;VENTOLIN HFA Inhale 2 puffs into the lungs every 6 (six) hours as needed for wheezing or  shortness of breath.   albuterol (2.5 MG/3ML) 0.083% nebulizer solution Commonly known as:  PROVENTIL Take 3 mLs (2.5 mg total) by nebulization every 6 (six) hours as needed for wheezing or shortness of breath.   allopurinol 300 MG tablet Commonly known as:  ZYLOPRIM Take 300 mg by mouth 2 (two) times daily.   aspirin 81 MG EC tablet Take 1 tablet (81 mg total) by mouth daily.   atorvastatin 80 MG tablet Commonly known as:  LIPITOR Take 1 tablet (80 mg total) by mouth daily.   carvedilol 3.125 MG tablet Commonly known as:  COREG Take 1 tablet (3.125 mg total) by mouth 2 (two) times daily.   digoxin 0.125 MG tablet Commonly known as:  LANOXIN Take 1 tablet (0.125 mg total) by mouth daily.   diphenhydrAMINE 25 MG tablet Commonly known as:  BENADRYL Take 25 mg by mouth daily as needed for itching.   freestyle lancets Use to check sugars daily   glucose blood test strip Use as instructed   Investigational - Study Medication Take 1 tablet by mouth 2 (two) times daily. Study name: Galactic HF Study Additional study details: Omecamtiv Mecarbil or Placebo   isosorbide-hydrALAZINE 20-37.5 MG tablet Commonly known as:  BIDIL Take 0.5 tablets by mouth 3 (three) times daily.   metFORMIN 500 MG tablet Commonly known as:  GLUCOPHAGE Take 500 mg by mouth 2 (two) times daily with a meal.   mupirocin ointment 2 % Commonly known as:  BACTROBAN Place into the nose 2 (two) times daily.   naproxen sodium 220 MG tablet Commonly known as:  ANAPROX Take 440 mg by mouth daily as needed (pain).   pantoprazole 40 MG tablet Commonly known as:  PROTONIX Take 1 tablet (40 mg total) by mouth daily.   sacubitril-valsartan 24-26 MG Commonly known as:  ENTRESTO Take 1 tablet by mouth 2 (two) times daily.   spironolactone 25 MG tablet Commonly known as:  ALDACTONE Take 12.5 mg by mouth daily.   torsemide 20 MG tablet Commonly known as:  DEMADEX Take 2 tablets (40 mg total) by mouth  daily. What changed:  how much to take  when to take this       Disposition:  Home  Discharge Instructions    Diet - low sodium heart healthy    Complete by:  As directed    Increase activity slowly    Complete by:  As directed      Follow-up Information    [facility] Follow up on 11/04/2017.   Specialty:  Cardiology Why:  9:00AM, wound check visit Contact information: [address] [place]       Constance Haw, MD Follow up on 01/21/2018.   Specialty:  Cardiology Why:  9:30AM Contact information: [place] [address] [phone]           Duration of Discharge Encounter: Greater than 30 minutes including physician time.  Signed, Joseph Art  Olevia Bowens 10/22/2017 9:48 AM  I have seen and examined this patient with Tommye Standard.  Agree with above, note added to reflect my findings.  On exam, RRR, no murmurs, lungs clear. Had CRT-D implanted for ischemic cardiomyopathy. CXR and interrogation without issue. Plan for discharge today with follow up in device clinic.    Emerita Berkemeier M. Linell Shawn MD 10/22/2017 12:34 PM

## 2017-10-21 NOTE — H&P (Signed)
Patrick Stewart is a 61 y.o. male with a history of ischemic cardiomyopathy. He has been on optimal therapy with a persistently low LVEF. He thus presents for CRT-D implant. On exam, RRR, no murmurs, lungs clear. Risks and benefits discussed. Risks include but not limited to bleeding, infection, tamponade, pneumothorax. The patient understands the risks and has agreed to the procedure.  Cristofher Livecchi Curt Bears, MD 10/21/2017 7:09 AM  ICD Criteria  Current LVEF:20-25%. Within 12 months prior to implant: Yes   Heart failure history: Yes, Class II  Cardiomyopathy history: Yes, Ischemic Cardiomyopathy - Prior MI.  Atrial Fibrillation/Atrial Flutter: No.  Ventricular tachycardia history: No.  Cardiac arrest history: No.  History of syndromes with risk of sudden death: No.  Previous ICD: No.  Current ICD indication: Primary  PPM indication: No.   Class I or II Bradycardia indication present: No  Beta Blocker therapy for 3 or more months: Yes, prescribed.   Ace Inhibitor/ARB therapy for 3 or more months: Yes, prescribed.

## 2017-10-22 ENCOUNTER — Ambulatory Visit (HOSPITAL_COMMUNITY): Payer: Medicare Other

## 2017-10-22 ENCOUNTER — Encounter (HOSPITAL_COMMUNITY): Payer: Self-pay | Admitting: Surgery

## 2017-10-22 DIAGNOSIS — I5022 Chronic systolic (congestive) heart failure: Secondary | ICD-10-CM | POA: Diagnosis not present

## 2017-10-22 DIAGNOSIS — N182 Chronic kidney disease, stage 2 (mild): Secondary | ICD-10-CM | POA: Diagnosis not present

## 2017-10-22 DIAGNOSIS — E1122 Type 2 diabetes mellitus with diabetic chronic kidney disease: Secondary | ICD-10-CM | POA: Diagnosis not present

## 2017-10-22 DIAGNOSIS — I2510 Atherosclerotic heart disease of native coronary artery without angina pectoris: Secondary | ICD-10-CM | POA: Diagnosis not present

## 2017-10-22 DIAGNOSIS — I428 Other cardiomyopathies: Secondary | ICD-10-CM | POA: Diagnosis not present

## 2017-10-22 DIAGNOSIS — I255 Ischemic cardiomyopathy: Secondary | ICD-10-CM | POA: Diagnosis not present

## 2017-10-22 DIAGNOSIS — I447 Left bundle-branch block, unspecified: Secondary | ICD-10-CM | POA: Diagnosis not present

## 2017-10-22 DIAGNOSIS — I517 Cardiomegaly: Secondary | ICD-10-CM | POA: Diagnosis not present

## 2017-10-22 DIAGNOSIS — I130 Hypertensive heart and chronic kidney disease with heart failure and stage 1 through stage 4 chronic kidney disease, or unspecified chronic kidney disease: Secondary | ICD-10-CM | POA: Diagnosis not present

## 2017-10-22 DIAGNOSIS — J449 Chronic obstructive pulmonary disease, unspecified: Secondary | ICD-10-CM | POA: Diagnosis not present

## 2017-10-22 DIAGNOSIS — I252 Old myocardial infarction: Secondary | ICD-10-CM | POA: Diagnosis not present

## 2017-10-22 LAB — GLUCOSE, CAPILLARY
GLUCOSE-CAPILLARY: 195 mg/dL — AB (ref 65–99)
Glucose-Capillary: 171 mg/dL — ABNORMAL HIGH (ref 65–99)
Glucose-Capillary: 148 mg/dL — ABNORMAL HIGH (ref 65–99)

## 2017-10-22 MED ORDER — MUPIROCIN 2 % EX OINT: TOPICAL_OINTMENT | Freq: Two times a day (BID) | CUTANEOUS | 0 refills | Status: AC

## 2017-10-22 MED ORDER — CHLORHEXIDINE GLUCONATE CLOTH 2 % EX PADS
6.0000 | MEDICATED_PAD | Freq: Every day | CUTANEOUS | Status: DC
  Administered 2017-10-22: 6 via TOPICAL

## 2017-10-22 MED ORDER — MUPIROCIN 2 % EX OINT
1.0000 "application " | TOPICAL_OINTMENT | Freq: Two times a day (BID) | CUTANEOUS | Status: DC
  Administered 2017-10-22: 1 via NASAL

## 2017-10-22 MED FILL — Heparin Sodium (Porcine) 2 Unit/ML in Sodium Chloride 0.9%: INTRAMUSCULAR | Qty: 500 | Status: AC

## 2017-10-22 NOTE — Progress Notes (Signed)
Discharge instructions reviewed with patient, safety restrictions regarding arm were di fib was placed was also reviewed with the patient, no new prescriptions, patient to follow up with MD for checking of de fib next week Neta Mends RN 11:46 AM 10-22-2017

## 2017-10-27 ENCOUNTER — Other Ambulatory Visit (HOSPITAL_COMMUNITY): Payer: Self-pay | Admitting: *Deleted

## 2017-10-27 MED ORDER — DIGOXIN 125 MCG PO TABS: 0.1250 mg | ORAL_TABLET | Freq: Every day | ORAL | 1 refills | Status: DC

## 2017-11-03 ENCOUNTER — Encounter (HOSPITAL_COMMUNITY): Payer: Self-pay

## 2017-11-03 ENCOUNTER — Ambulatory Visit (HOSPITAL_COMMUNITY)
Admission: RE | Admit: 2017-11-03 | Discharge: 2017-11-03 | Disposition: A | Discharge status: Home / Self Care | Payer: Medicare Other | Source: Ambulatory Visit | Attending: Internal Medicine | Admitting: Internal Medicine

## 2017-11-03 VITALS — BP 142/78 | HR 78 | Wt 198.8 lb

## 2017-11-03 DIAGNOSIS — I255 Ischemic cardiomyopathy: Secondary | ICD-10-CM

## 2017-11-03 DIAGNOSIS — Z87442 Personal history of urinary calculi: Secondary | ICD-10-CM | POA: Insufficient documentation

## 2017-11-03 DIAGNOSIS — Z951 Presence of aortocoronary bypass graft: Secondary | ICD-10-CM | POA: Insufficient documentation

## 2017-11-03 DIAGNOSIS — I2510 Atherosclerotic heart disease of native coronary artery without angina pectoris: Secondary | ICD-10-CM | POA: Insufficient documentation

## 2017-11-03 DIAGNOSIS — I5022 Chronic systolic (congestive) heart failure: Secondary | ICD-10-CM | POA: Insufficient documentation

## 2017-11-03 DIAGNOSIS — N183 Chronic kidney disease, stage 3 unspecified: Secondary | ICD-10-CM

## 2017-11-03 DIAGNOSIS — M109 Gout, unspecified: Secondary | ICD-10-CM | POA: Diagnosis not present

## 2017-11-03 DIAGNOSIS — E7800 Pure hypercholesterolemia, unspecified: Secondary | ICD-10-CM | POA: Diagnosis not present

## 2017-11-03 DIAGNOSIS — I252 Old myocardial infarction: Secondary | ICD-10-CM | POA: Diagnosis not present

## 2017-11-03 DIAGNOSIS — J449 Chronic obstructive pulmonary disease, unspecified: Secondary | ICD-10-CM | POA: Diagnosis not present

## 2017-11-03 DIAGNOSIS — Z87891 Personal history of nicotine dependence: Secondary | ICD-10-CM | POA: Insufficient documentation

## 2017-11-03 DIAGNOSIS — I447 Left bundle-branch block, unspecified: Secondary | ICD-10-CM | POA: Diagnosis not present

## 2017-11-03 DIAGNOSIS — Z79899 Other long term (current) drug therapy: Secondary | ICD-10-CM | POA: Diagnosis not present

## 2017-11-03 DIAGNOSIS — E1122 Type 2 diabetes mellitus with diabetic chronic kidney disease: Secondary | ICD-10-CM | POA: Insufficient documentation

## 2017-11-03 DIAGNOSIS — I130 Hypertensive heart and chronic kidney disease with heart failure and stage 1 through stage 4 chronic kidney disease, or unspecified chronic kidney disease: Secondary | ICD-10-CM | POA: Insufficient documentation

## 2017-11-03 DIAGNOSIS — Z7984 Long term (current) use of oral hypoglycemic drugs: Secondary | ICD-10-CM | POA: Diagnosis not present

## 2017-11-03 DIAGNOSIS — Z7982 Long term (current) use of aspirin: Secondary | ICD-10-CM | POA: Diagnosis not present

## 2017-11-03 LAB — BASIC METABOLIC PANEL
Anion gap: 6 (ref 5–15)
BUN: 33 mg/dL — AB (ref 6–20)
CALCIUM: 8.9 mg/dL (ref 8.9–10.3)
CHLORIDE: 103 mmol/L (ref 101–111)
CO2: 28 mmol/L (ref 22–32)
CREATININE: 1.88 mg/dL — AB (ref 0.61–1.24)
GFR calc Af Amer: 43 mL/min — ABNORMAL LOW (ref >60)
GFR calc non Af Amer: 37 mL/min — ABNORMAL LOW (ref >60)
GLUCOSE: 153 mg/dL — AB (ref 65–99)
Potassium: 4 mmol/L (ref 3.5–5.1)
Sodium: 137 mmol/L (ref 135–145)

## 2017-11-03 LAB — DIGOXIN LEVEL: Digoxin Level: 0.6 ng/mL — ABNORMAL LOW (ref 0.8–2.0)

## 2017-11-03 MED ORDER — ISOSORB DINITRATE-HYDRALAZINE 20-37.5 MG PO TABS: 0.5000 | ORAL_TABLET | Freq: Three times a day (TID) | ORAL | 0 refills | Status: DC

## 2017-11-03 MED ORDER — CARVEDILOL 6.25 MG PO TABS: 6.2500 mg | ORAL_TABLET | Freq: Two times a day (BID) | ORAL | 6 refills | Status: DC

## 2017-11-03 NOTE — Progress Notes (Signed)
Advanced Heart Failure Clinic Note   Primary Care: Dr. Nani Ravens.  Primary Cardiologist: Dr. Aundra Dubin   HPI: Patrick Stewart is a 61 year old AA with history of ICM, CAD s/p CABG x 2 (SVG -> OM1, LIMA -> LAD)in 2010, COPD, DMII, gout, former smoker, and HTN. Previously followed at [facility] for CAD/CHF but has been lost to follow up.   Admitted 4/15 - 06/27/15 with A/C systolic CHF. Echo with EF 10% from previous 30-35%. Diuresed with IV lasix and transitioned to torsemide. Down 12 lbs overall. Started on [place]. L/RHC with stable coronaries.    Pt enrolled in Newborn HF Study.   Admitted 10/25 for CRT-D medtronic. Isordil was stopped on d/c. ? If he had isordil and bidil at home.   Today he returns for HF follow up.  Overall feeling fine. Says he feels much better with CRT-D. Denies SOB/PND/Orthopnea. Appetite ok. No fever or chills. Weight at home stable 199 pounds.  Taking all medications  Social - Lives at home with wife. 2 kids are grown. 9 grandkids. Originally from [place], [place]. Disabled. Worked as Animal nutritionist at [employer].   PMH 1. Chronic systolic CHF: Ischemic cardiomyopathy. Echo 04/12/17 EF 15%, moderately dilated LV, inferior and inferolateral akinesis, septal-lateral dyssynchrony (reviewed personally). EF lower compared to past (31% on 7/17 cardiac MRI).  - RHC/LHC with stable coronary disease and adequate cardiac output.  - Have discussed CRT-D placement. Will continue to optimize meds over next few months. If EF remains low July/August, will need EP referral  2. CKD stage II:  3 CAD: s/p CABG x 2 in 2010 at [facility] - Coronary disease stable with patent grafts on cath 02/2017 despite fall in EF.  4. LBBB: Dyssynchrony on ECG. Likely would benefit from CRT.  5. DM2  [facility] 04/15/2017  Left Main  30% stenosis.  Left Anterior Descending  90% mid LAD stenosis. Patent LIMA-LAD.  Left Circumflex  Moderate high OM1 with luminal irregularities. Small caliber distal LCx with moderate  diffuse disease. 70-80% stenosis proximally in moderate OM2. Patent SVG-OM2.  Right Coronary Artery  30% proximal and mid RCA stenoses.   RA 9 RV 53/11 PA 52/17, mean 35 PCWP mean 14 LV 133/30 AO 136/86 Oxygen saturations: PA 60% AO 97% Cardiac Output (Fick) 4.12  Cardiac Index (Fick) 1.94   Past Medical History:  Diagnosis Date  . Angina at rest [facility]) 07/08/2016  . CAD (coronary artery disease) 2010   3v CABG  . Chest pain at rest 07/08/2016  . CHF (congestive heart failure) ([place]) 01/26/2012  . CKD (chronic kidney disease) 02/19/2012  . COPD with chronic bronchitis ([place]) 08/26/2016  . Diabetes mellitus type 2, uncontrolled ([place]) 01/26/2012  . Elevated liver function tests 01/26/2012  . Gout 01/26/2012  . Hx of CABG 2010   RCA Stent 2008, CABG x 2 [facility] 2010  . Hypercholesterolemia 07/08/2016  . Hypertension 09/04/2014  . Ischemic cardiomyopathy 11/15/2014  . Kidney stone 10/25/2015  . Lipid disorder 01/26/2012  . NSTEMI (non-ST elevated myocardial infarction) ([place]) 07/08/2016  . ST elevation myocardial infarction (STEMI) ([place]) 2010  . Unstable angina ([place]) 11/14/2014    Current Outpatient Medications  Medication Sig Dispense Refill  . albuterol (PROVENTIL HFA;VENTOLIN HFA) 108 (90 Base) MCG/ACT inhaler Inhale 2 puffs into the lungs every 6 (six) hours as needed for wheezing or shortness of breath.    Marland Kitchen albuterol (PROVENTIL) (2.5 MG/3ML) 0.083% nebulizer solution Take 3 mLs (2.5 mg total) by nebulization every 6 (six) hours as needed for  wheezing or shortness of breath. 75 mL 3  . allopurinol (ZYLOPRIM) 300 MG tablet Take 300 mg by mouth 2 (two) times daily.    Marland Kitchen aspirin EC 81 MG EC tablet Take 1 tablet (81 mg total) by mouth daily. 30 tablet 6  . atorvastatin (LIPITOR) 80 MG tablet Take 1 tablet (80 mg total) by mouth daily. 30 tablet 11  . carvedilol (COREG) 3.125 MG tablet Take 1 tablet (3.125 mg total) by mouth 2 (two) times daily. 60 tablet 6  . digoxin (LANOXIN)  0.125 MG tablet Take 1 tablet (0.125 mg total) by mouth daily. 90 tablet 1  . diphenhydrAMINE (BENADRYL) 25 MG tablet Take 25 mg by mouth daily as needed for itching.    Marland Kitchen glucose blood test strip Use as instructed 100 each 3  . Investigational - Study Medication Take 1 tablet by mouth 2 (two) times daily. Study name: Galactic HF Study Additional study details: Omecamtiv Mecarbil or Placebo 1 each PRN  . Lancets (FREESTYLE) lancets Use to check sugars daily 100 each 3  . metFORMIN (GLUCOPHAGE) 500 MG tablet Take 500 mg by mouth 2 (two) times daily with a meal.    . naproxen sodium (ANAPROX) 220 MG tablet Take 440 mg by mouth daily as needed (pain).    . pantoprazole (PROTONIX) 40 MG tablet Take 1 tablet (40 mg total) by mouth daily. 30 tablet 3  . sacubitril-valsartan (ENTRESTO) 24-26 MG Take 1 tablet by mouth 2 (two) times daily. 60 tablet 6  . spironolactone (ALDACTONE) 25 MG tablet Take 12.5 mg by mouth daily.     Marland Kitchen torsemide (DEMADEX) 20 MG tablet Take 20 mg 2 (two) times daily by mouth.    . isosorbide-hydrALAZINE (BIDIL) 20-37.5 MG tablet Take 0.5 tablets by mouth 3 (three) times daily. (Patient not taking: Reported on 11/03/2017) 30 tablet 0  . torsemide (DEMADEX) 20 MG tablet Take 2 tablets (40 mg total) by mouth daily. (Patient not taking: Reported on 11/03/2017) 60 tablet 0   No current facility-administered medications for this encounter.     Allergies  Allergen Reactions  . Hydrocodone Itching  . Tramadol Nausea Only     Social History   Socioeconomic History  . Marital status: Married    Spouse name: Not on file  . Number of children: Not on file  . Years of education: Not on file  . Highest education level: Not on file  Social Needs  . Financial resource strain: Not on file  . Food insecurity - worry: Not on file  . Food insecurity - inability: Not on file  . Transportation needs - medical: Not on file  . Transportation needs - non-medical: Not on file  Occupational  History  . Occupation: disabled  Tobacco Use  . Smoking status: Former Smoker    Packs/day: 0.50    Years: 30.00    Pack years: 15.00    Last attempt to quit: 07/10/2016    Years since quitting: 1.3  . Smokeless tobacco: Never Used  Substance and Sexual Activity  . Alcohol use: No  . Drug use: No  . Sexual activity: Yes  Other Topics Concern  . Not on file  Social History Narrative  . Not on file    Family History  Problem Relation Age of Onset  . Hypertension Mother   . Diabetes Mother   . Hyperlipidemia Mother   . Kidney failure Mother   . Emphysema Father     Vitals:   11/03/17 0845  BP: (!) 142/78  Pulse: 78  SpO2: 98%  Weight: 198 lb 12.8 oz (90.2 kg)   Wt Readings from Last 3 Encounters:  11/03/17 198 lb 12.8 oz (90.2 kg)  10/22/17 197 lb 6.4 oz (89.5 kg)  10/18/17 200 lb (90.7 kg)     PHYSICAL EXAM: General:  Well appearing. No resp difficulty. Walked in the clinic without difficulty.  HEENT: normal Neck: supple. no JVD. Carotids 2+ bilat; no bruits. No lymphadenopathy or thryomegaly appreciated. Cor: PMI nondisplaced. Regular rate & rhythm. No rubs, gallops or murmurs. Left upper chest steri strips over ICD incision Lungs: clear Abdomen: soft, nontender, nondistended. No hepatosplenomegaly. No bruits or masses. Good bowel sounds. Extremities: no cyanosis, clubbing, rash, edema Neuro: alert & orientedx3, cranial nerves grossly intact. moves all 4 extremities w/o difficulty. Affect pleasant   ASSESSMENT & PLAN:  1. Chronic systolic CHF: Ischemic cardiomyopathy. Echo 04/12/17 EF 15%, moderately dilated LV, inferior and inferolateral akinesis, septal-lateral dyssynchrony. EF lower compared to past (31% on 7/17 cardiac MRI).  - RHC/LHC with stable coronary disease and adequate cardiac output. S/P CRT-D Medtronic Optivol. Repeat ECHO in 3 months.   NYHA I Volume status stable. Continue torsemide 20 mg twice a day.  - Increase carvedilol 6.25 mg twice a  day.  - Continue entresto 24-26 mg twice a day - Continue 12.5 mg spiro daily.  -  Continue digoxin 0.125 mg daily.  - Add back 1/2 tab bidil three times a day.  - Check BMET and dig level today.   2. CKD stage III [place] baseline 1.6-1.8  -Most recent creatinine 1.9  - I have asked him to stop taking naproxen with CKD.   3. CAD: Coronary disease stable with patent grafts despite fall in EF.  - No S/S ischemia  - Continue statin, bb, and ASA 81 mg daily.   4. LBBB:  - S/P CRT-d    -has follow up with EP.   5. DM: - Per PCP.   Follow up in 6 weeks.   Darrick Grinder, NP 11/03/17

## 2017-11-03 NOTE — Patient Instructions (Signed)
STOP Anaprox/Naproxen  START Bidil 1/2 tab three times daily.  INCREASE Carvedilol (Coreg) to 6.25 mg twice daily. May "double up" on your current 3.125 mg tablets you have at home (Take 2 tabs twice daily as instructed on your bottle). New Rx will be sent to your pharmacy for 6.25 mg tablets (Take 1 tab twice daily as instructed on this new bottle).  Routine lab work today. Will notify you of abnormal results, otherwise no news is good news!  Follow up 6 weeks with Amy Clegg NP-C.  Take all medication as prescribed the day of your appointment. Bring all medications with you to your appointment.  Do the following things EVERYDAY: 1) Weigh yourself in the morning before breakfast. Write it down and keep it in a log. 2) Take your medicines as prescribed 3) Eat low salt foods-Limit salt (sodium) to 2000 mg per day.  4) Stay as active as you can everyday 5) Limit all fluids for the day to less than 2 liters

## 2017-11-04 ENCOUNTER — Ambulatory Visit (INDEPENDENT_AMBULATORY_CARE_PROVIDER_SITE_OTHER): Payer: Medicare Other | Admitting: *Deleted

## 2017-11-04 DIAGNOSIS — I447 Left bundle-branch block, unspecified: Secondary | ICD-10-CM

## 2017-11-04 DIAGNOSIS — I255 Ischemic cardiomyopathy: Secondary | ICD-10-CM

## 2017-11-04 DIAGNOSIS — I5022 Chronic systolic (congestive) heart failure: Secondary | ICD-10-CM

## 2017-11-05 LAB — CUP PACEART INCLINIC DEVICE CHECK
Battery Remaining Longevity: 110 mo
Brady Statistic AS VS Percent: 2.4 %
Date Time Interrogation Session: 20181109154751
HighPow Impedance: 69 Ohm
Implantable Lead Implant Date: 20181025
Implantable Lead Implant Date: 20181025
Implantable Lead Location: 753858
Implantable Pulse Generator Implant Date: 20181025
Lead Channel Impedance Value: 456 Ohm
Lead Channel Impedance Value: 703 Ohm
Lead Channel Pacing Threshold Amplitude: 0.5 V
Lead Channel Pacing Threshold Amplitude: 1 V
Lead Channel Pacing Threshold Pulse Width: 0.4 ms
Lead Channel Pacing Threshold Pulse Width: 0.4 ms
Lead Channel Setting Pacing Amplitude: 2.5 V
Lead Channel Setting Pacing Pulse Width: 0.5 ms
Lead Channel Setting Sensing Sensitivity: 0.3 mV
MDC IDC LEAD IMPLANT DT: 20181025
MDC IDC LEAD LOCATION: 753859
MDC IDC LEAD LOCATION: 753860
MDC IDC MSMT LEADCHNL LV PACING THRESHOLD PULSEWIDTH: 0.5 ms
MDC IDC MSMT LEADCHNL RA PACING THRESHOLD AMPLITUDE: 0.75 V
MDC IDC MSMT LEADCHNL RA SENSING INTR AMPL: 2.9 mV
MDC IDC MSMT LEADCHNL RV IMPEDANCE VALUE: 380 Ohm
MDC IDC MSMT LEADCHNL RV SENSING INTR AMPL: 13.1 mV
MDC IDC SET LEADCHNL RA PACING AMPLITUDE: 3.5 V
MDC IDC SET LEADCHNL RV PACING AMPLITUDE: 3.5 V
MDC IDC SET LEADCHNL RV PACING PULSEWIDTH: 0.4 ms
MDC IDC STAT BRADY AP VP PERCENT: 3.1 %
MDC IDC STAT BRADY AP VS PERCENT: 0.1 % — AB
MDC IDC STAT BRADY AS VP PERCENT: 94.4 %

## 2017-11-05 NOTE — Progress Notes (Signed)
Wound check appointment. Steri-strips removed. Wound without redness. Incision edges approximated, wound well healed. Moderate hematoma present at device pocket, firm upon palpation. No ecchymosis. AS assessed site and recommended that patient f/u in 2 weeks for a wound recheck. Patient was advised to call if he notices that the site becomes larger, tighter, drains, or if he develops fever/chills. CRT-D checked in office. Normal device function. Thresholds, sensing, and impedances consistent with implant measurements. Device programmed at 3.5V for extra safety margin until 3 month visit. Histogram distribution appropriate for patient and level of activity. No mode switches or ventricular arrhythmias noted. Patient educated about wound care, arm mobility, lifting restrictions, shock plan. ROV in 2 weeks with DC.

## 2017-11-24 ENCOUNTER — Encounter: Payer: Self-pay | Admitting: Cardiology

## 2017-11-25 ENCOUNTER — Ambulatory Visit (INDEPENDENT_AMBULATORY_CARE_PROVIDER_SITE_OTHER): Payer: Medicare Other | Admitting: *Deleted

## 2017-11-25 DIAGNOSIS — Z95810 Presence of automatic (implantable) cardiac defibrillator: Secondary | ICD-10-CM

## 2017-11-25 DIAGNOSIS — I5022 Chronic systolic (congestive) heart failure: Secondary | ICD-10-CM

## 2017-11-25 NOTE — Progress Notes (Signed)
Wound recheck appointment.  Incision edges well healed.  Hematoma resolved.  No ecchymosis noted.  Patient educated about monitoring site for signs/symptoms of infection, including drainage, redness, swelling, fever/chills.  ROV with WC on 01/26/18.

## 2017-12-15 ENCOUNTER — Telehealth (HOSPITAL_COMMUNITY): Payer: Self-pay | Admitting: Adult Health

## 2017-12-15 ENCOUNTER — Encounter (HOSPITAL_COMMUNITY): Payer: Self-pay

## 2017-12-15 ENCOUNTER — Ambulatory Visit: Payer: Medicare Other | Admitting: Cardiology

## 2017-12-15 ENCOUNTER — Ambulatory Visit (HOSPITAL_COMMUNITY)
Admission: RE | Admit: 2017-12-15 | Discharge: 2017-12-15 | Disposition: A | Discharge status: Home / Self Care | Payer: Medicare Other | Source: Ambulatory Visit | Attending: Cardiology | Admitting: Cardiology

## 2017-12-15 VITALS — BP 128/78 | HR 67 | Wt 197.0 lb

## 2017-12-15 DIAGNOSIS — E7800 Pure hypercholesterolemia, unspecified: Secondary | ICD-10-CM | POA: Diagnosis not present

## 2017-12-15 DIAGNOSIS — Z95810 Presence of automatic (implantable) cardiac defibrillator: Secondary | ICD-10-CM

## 2017-12-15 DIAGNOSIS — Z7982 Long term (current) use of aspirin: Secondary | ICD-10-CM | POA: Diagnosis not present

## 2017-12-15 DIAGNOSIS — E1122 Type 2 diabetes mellitus with diabetic chronic kidney disease: Secondary | ICD-10-CM | POA: Diagnosis not present

## 2017-12-15 DIAGNOSIS — Z87891 Personal history of nicotine dependence: Secondary | ICD-10-CM | POA: Insufficient documentation

## 2017-12-15 DIAGNOSIS — I252 Old myocardial infarction: Secondary | ICD-10-CM | POA: Diagnosis not present

## 2017-12-15 DIAGNOSIS — N183 Chronic kidney disease, stage 3 unspecified: Secondary | ICD-10-CM

## 2017-12-15 DIAGNOSIS — Z87442 Personal history of urinary calculi: Secondary | ICD-10-CM | POA: Insufficient documentation

## 2017-12-15 DIAGNOSIS — Z7984 Long term (current) use of oral hypoglycemic drugs: Secondary | ICD-10-CM | POA: Diagnosis not present

## 2017-12-15 DIAGNOSIS — I255 Ischemic cardiomyopathy: Secondary | ICD-10-CM | POA: Insufficient documentation

## 2017-12-15 DIAGNOSIS — I5022 Chronic systolic (congestive) heart failure: Secondary | ICD-10-CM | POA: Diagnosis not present

## 2017-12-15 DIAGNOSIS — Z885 Allergy status to narcotic agent status: Secondary | ICD-10-CM | POA: Insufficient documentation

## 2017-12-15 DIAGNOSIS — J449 Chronic obstructive pulmonary disease, unspecified: Secondary | ICD-10-CM | POA: Diagnosis not present

## 2017-12-15 DIAGNOSIS — Z79899 Other long term (current) drug therapy: Secondary | ICD-10-CM | POA: Diagnosis not present

## 2017-12-15 DIAGNOSIS — Z833 Family history of diabetes mellitus: Secondary | ICD-10-CM | POA: Insufficient documentation

## 2017-12-15 DIAGNOSIS — Z825 Family history of asthma and other chronic lower respiratory diseases: Secondary | ICD-10-CM | POA: Diagnosis not present

## 2017-12-15 DIAGNOSIS — Z841 Family history of disorders of kidney and ureter: Secondary | ICD-10-CM | POA: Insufficient documentation

## 2017-12-15 DIAGNOSIS — Z888 Allergy status to other drugs, medicaments and biological substances status: Secondary | ICD-10-CM | POA: Insufficient documentation

## 2017-12-15 DIAGNOSIS — I2510 Atherosclerotic heart disease of native coronary artery without angina pectoris: Secondary | ICD-10-CM | POA: Diagnosis not present

## 2017-12-15 DIAGNOSIS — M109 Gout, unspecified: Secondary | ICD-10-CM | POA: Diagnosis not present

## 2017-12-15 DIAGNOSIS — Z951 Presence of aortocoronary bypass graft: Secondary | ICD-10-CM | POA: Insufficient documentation

## 2017-12-15 DIAGNOSIS — I130 Hypertensive heart and chronic kidney disease with heart failure and stage 1 through stage 4 chronic kidney disease, or unspecified chronic kidney disease: Secondary | ICD-10-CM | POA: Diagnosis not present

## 2017-12-15 DIAGNOSIS — Z8249 Family history of ischemic heart disease and other diseases of the circulatory system: Secondary | ICD-10-CM | POA: Insufficient documentation

## 2017-12-15 DIAGNOSIS — I447 Left bundle-branch block, unspecified: Secondary | ICD-10-CM | POA: Diagnosis not present

## 2017-12-15 LAB — BASIC METABOLIC PANEL
ANION GAP: 8 (ref 5–15)
BUN: 20 mg/dL (ref 6–20)
CHLORIDE: 101 mmol/L (ref 101–111)
CO2: 27 mmol/L (ref 22–32)
Calcium: 9.2 mg/dL (ref 8.9–10.3)
Creatinine, Ser: 1.57 mg/dL — ABNORMAL HIGH (ref 0.61–1.24)
GFR calc Af Amer: 53 mL/min — ABNORMAL LOW (ref >60)
GFR calc non Af Amer: 46 mL/min — ABNORMAL LOW (ref >60)
Glucose, Bld: 146 mg/dL — ABNORMAL HIGH (ref 65–99)
POTASSIUM: 3.9 mmol/L (ref 3.5–5.1)
SODIUM: 136 mmol/L (ref 135–145)

## 2017-12-15 MED ORDER — CARVEDILOL 6.25 MG PO TABS: 9.3750 mg | ORAL_TABLET | Freq: Two times a day (BID) | ORAL | 6 refills | Status: DC

## 2017-12-15 NOTE — Patient Instructions (Addendum)
INCREASE Carvedilol (Coreg) to 9.375 mg (1.5 tabs) twice daily.  Routine lab work today. Will notify you of abnormal results, otherwise no news is good news!  Will schedule you for an echocardiogram at [facility] with Dr. Curt Bears appointment on 12/31. Address: [address]), [place], [place] [address]  Phone: [phone]  Follow up 2 months with Amy Clegg NP-C. Take all medication as prescribed the day of your appointment. Bring all medications with you to your appointment.  Do the following things EVERYDAY: 1) Weigh yourself in the morning before breakfast. Write it down and keep it in a log. 2) Take your medicines as prescribed 3) Eat low salt foods-Limit salt (sodium) to 2000 mg per day.  4) Stay as active as you can everyday 5) Limit all fluids for the day to less than 2 liters

## 2017-12-15 NOTE — Progress Notes (Signed)
Advanced Heart Failure Clinic Note   Primary Care: Dr. Nani Ravens.  Primary Cardiologist: Dr. Aundra Dubin   HPI: Patrick Stewart is a 61 year old AA with history of ICM, CAD s/p CABG x 2 (SVG -> OM1, LIMA -> LAD)in 2010, COPD, DMII, gout, former smoker, and HTN. Previously followed at [facility] for CAD/CHF but has been lost to follow up.   Admitted 4/15 - 6/78/93 with A/C systolic CHF. Echo with EF 10% from previous 30-35%. Diuresed with IV lasix and transitioned to torsemide. Down 12 lbs overall. Started on [place]. L/RHC with stable coronaries.    Pt enrolled in [place] HF Study.   Admitted 10/25 for CRT-D medtronic. Isordil was stopped on d/c. ? If he had isordil and bidil at home.   Today he returns HF follow up. Overall feeling fine. Says he can do whatever he wants. Denies SOB/PND/Orthopnea. Appetite ok. No fever or chills. Weight at home has been stable. Taking all medications   Social - Lives at home with wife. 2 kids are grown. 9 grandkids. Originally from [place], [place]. Disabled. Worked as Animal nutritionist at [employer].   PMH 1. Chronic systolic CHF: Ischemic cardiomyopathy. Echo 04/12/17 EF 15%, moderately dilated LV, inferior and inferolateral akinesis, septal-lateral dyssynchrony (reviewed personally). EF lower compared to past (31% on 7/17 cardiac MRI).  - RHC/LHC with stable coronary disease and adequate cardiac output.  - Have discussed CRT-D placement. Will continue to optimize meds over next few months. If EF remains low July/August, will need EP referral  2. CKD stage II:  3 CAD: s/p CABG x 2 in 2010 at [facility] - Coronary disease stable with patent grafts on cath 02/2017 despite fall in EF.  4. LBBB: Dyssynchrony on ECG. Likely would benefit from CRT.  5. DM2  [facility] 04/15/2017  Left Main  30% stenosis.  Left Anterior Descending  90% mid LAD stenosis. Patent LIMA-LAD.  Left Circumflex  Moderate high OM1 with luminal irregularities. Small caliber distal LCx with moderate diffuse  disease. 70-80% stenosis proximally in moderate OM2. Patent SVG-OM2.  Right Coronary Artery  30% proximal and mid RCA stenoses.   RA 9 RV 53/11 PA 52/17, mean 35 PCWP mean 14 LV 133/30 AO 136/86 Oxygen saturations: PA 60% AO 97% Cardiac Output (Fick) 4.12  Cardiac Index (Fick) 1.94   Past Medical History:  Diagnosis Date  . Angina at rest [facility]) 07/08/2016  . CAD (coronary artery disease) 2010   3v CABG  . Chest pain at rest 07/08/2016  . CHF (congestive heart failure) ([place]) 01/26/2012  . CKD (chronic kidney disease) 02/19/2012  . COPD with chronic bronchitis ([place]) 08/26/2016  . Diabetes mellitus type 2, uncontrolled ([place]) 01/26/2012  . Elevated liver function tests 01/26/2012  . Gout 01/26/2012  . Hx of CABG 2010   RCA Stent 2008, CABG x 2 [facility] 2010  . Hypercholesterolemia 07/08/2016  . Hypertension 09/04/2014  . Ischemic cardiomyopathy 11/15/2014  . Kidney stone 10/25/2015  . Lipid disorder 01/26/2012  . NSTEMI (non-ST elevated myocardial infarction) ([place]) 07/08/2016  . ST elevation myocardial infarction (STEMI) ([place]) 2010  . Unstable angina ([place]) 11/14/2014    Current Outpatient Medications  Medication Sig Dispense Refill  . albuterol (PROVENTIL HFA;VENTOLIN HFA) 108 (90 Base) MCG/ACT inhaler Inhale 2 puffs into the lungs every 6 (six) hours as needed for wheezing or shortness of breath.    Marland Kitchen albuterol (PROVENTIL) (2.5 MG/3ML) 0.083% nebulizer solution Take 3 mLs (2.5 mg total) by nebulization every 6 (six) hours as needed for wheezing or  shortness of breath. 75 mL 3  . allopurinol (ZYLOPRIM) 300 MG tablet Take 300 mg by mouth 2 (two) times daily.    Marland Kitchen aspirin EC 81 MG EC tablet Take 1 tablet (81 mg total) by mouth daily. 30 tablet 6  . atorvastatin (LIPITOR) 80 MG tablet Take 1 tablet (80 mg total) by mouth daily. 30 tablet 11  . carvedilol (COREG) 6.25 MG tablet Take 1 tablet (6.25 mg total) 2 (two) times daily by mouth. 60 tablet 6  . digoxin (LANOXIN) 0.125 MG  tablet Take 1 tablet (0.125 mg total) by mouth daily. 90 tablet 1  . diphenhydrAMINE (BENADRYL) 25 MG tablet Take 25 mg by mouth daily as needed for itching.    Marland Kitchen glucose blood test strip Use as instructed 100 each 3  . Investigational - Study Medication Take 1 tablet by mouth 2 (two) times daily. Study name: Galactic HF Study Additional study details: Omecamtiv Mecarbil or Placebo 1 each PRN  . Lancets (FREESTYLE) lancets Use to check sugars daily 100 each 3  . metFORMIN (GLUCOPHAGE) 500 MG tablet Take 500 mg by mouth 2 (two) times daily with a meal.    . pantoprazole (PROTONIX) 40 MG tablet Take 1 tablet (40 mg total) by mouth daily. 30 tablet 3  . sacubitril-valsartan (ENTRESTO) 24-26 MG Take 1 tablet by mouth 2 (two) times daily. 60 tablet 6  . spironolactone (ALDACTONE) 25 MG tablet Take 25 mg by mouth daily.    Marland Kitchen torsemide (DEMADEX) 20 MG tablet Take 20 mg 2 (two) times daily by mouth.     No current facility-administered medications for this encounter.     Allergies  Allergen Reactions  . Hydrocodone Itching  . Tramadol Nausea Only     Social History   Socioeconomic History  . Marital status: Married    Spouse name: Not on file  . Number of children: Not on file  . Years of education: Not on file  . Highest education level: Not on file  Social Needs  . Financial resource strain: Not on file  . Food insecurity - worry: Not on file  . Food insecurity - inability: Not on file  . Transportation needs - medical: Not on file  . Transportation needs - non-medical: Not on file  Occupational History  . Occupation: disabled  Tobacco Use  . Smoking status: Former Smoker    Packs/day: 0.50    Years: 30.00    Pack years: 15.00    Last attempt to quit: 07/10/2016    Years since quitting: 1.4  . Smokeless tobacco: Never Used  Substance and Sexual Activity  . Alcohol use: No  . Drug use: No  . Sexual activity: Yes  Other Topics Concern  . Not on file  Social History Narrative   . Not on file    Family History  Problem Relation Age of Onset  . Hypertension Mother   . Diabetes Mother   . Hyperlipidemia Mother   . Kidney failure Mother   . Emphysema Father     Vitals:   12/15/17 0827  BP: 128/78  Pulse: 67  SpO2: 100%  Weight: 197 lb (89.4 kg)   Wt Readings from Last 3 Encounters:  12/15/17 197 lb (89.4 kg)  11/03/17 198 lb 12.8 oz (90.2 kg)  10/22/17 197 lb 6.4 oz (89.5 kg)     PHYSICAL EXAM: General:  Well appearing. No resp difficulty. Walked in the clinic without difficulty.  HEENT: normal Neck: supple. no JVD. Carotids 2+  bilat; no bruits. No lymphadenopathy or thryomegaly appreciated. Cor: PMI nondisplaced. Regular rate & rhythm. No rubs, gallops or murmurs. Lungs: clear Abdomen: soft, nontender, nondistended. No hepatosplenomegaly. No bruits or masses. Good bowel sounds. Extremities: no cyanosis, clubbing, rash, edema Neuro: alert & orientedx3, cranial nerves grossly intact. moves all 4 extremities w/o difficulty. Affect pleasant       ASSESSMENT & PLAN: 1. Chronic systolic CHF: Ischemic cardiomyopathy. Echo 04/12/17 EF 15%, moderately dilated LV, inferior and inferolateral akinesis, septal-lateral dyssynchrony. EF lower compared to past (31% on 7/17 cardiac MRI).  - RHC/LHC with stable coronary disease and adequate cardiac output. S/P CRT-D Medtronic Optivol fluid below threshold. Repeat ECHO the edn January with Dr Curt Bears. .   NYHA 1 . Volume . Volume status stable. Continue torsemide 20 mg twice a day.  -Increase carvedilol 9.375 mg twice a day.   - Continue entresto 24-26 mg twice a day - Continue spiro 25 mg daily. Check BMET .  -  Continue digoxin 0.125 mg daily.  - Contnue Bidil 1/2 tab three times a day. Intolerant higher dose due to headache.   2. CKD stage III -Creatinine baseline 1.6-1.8  -Check BMET today.     3. CAD: Coronary disease stable with patent grafts despite fall in EF.  - No S/S ischemia  - Continue  statin, bb, and ASA 81 mg daily.   4. LBBB:  - S/P CRT-d    -has follow up with EP in January with an ECHO.   5. DM: - Per PCP.    Follow up in 2-3 months.    Darrick Grinder, NP 12/15/17

## 2017-12-22 ENCOUNTER — Encounter: Payer: Medicare Other | Admitting: *Deleted

## 2017-12-22 VITALS — BP 151/87 | HR 63 | Resp 13 | Wt 196.2 lb

## 2017-12-22 DIAGNOSIS — Z006 Encounter for examination for normal comparison and control in clinical research program: Secondary | ICD-10-CM

## 2017-12-22 NOTE — Progress Notes (Signed)
RESEARCH ENCOUNTER  Patient ID: Patrick Stewart  DOB: [birth year]  [name] presented to the [facility] for the Week 36 visit of the [facility].  No signs/symptoms of ACS since the last visit. Subject compliant with IP, IP returned, and additional IP dispensed.  Study surveys and labs collected.  BP elevated at visit.  Subject stated that he "probably ate too many ribs" over the holiday.  Education provided on weighing daily and following a low sodium diet.  Overall, subject states that this is the best he has felt in a long time.  [facility] will [facility].

## 2017-12-22 NOTE — Telephone Encounter (Signed)
User: Cherie Dark A Date/time: 12/15/17 10:02 AM  Comment: Called pt and lmsg for him to CB to get scheduled for and echo.   Context:  Outcome: Left Message  Phone number: [phone] Phone Type: Home Phone  Comm. type: Telephone Call type: Outgoing  Contact: Mcneice, Germain Relation to patient: Self

## 2017-12-27 ENCOUNTER — Ambulatory Visit (HOSPITAL_COMMUNITY): Payer: Medicare Other | Attending: Adult Health

## 2017-12-27 DIAGNOSIS — E119 Type 2 diabetes mellitus without complications: Secondary | ICD-10-CM | POA: Insufficient documentation

## 2017-12-27 DIAGNOSIS — Z87891 Personal history of nicotine dependence: Secondary | ICD-10-CM | POA: Insufficient documentation

## 2017-12-27 DIAGNOSIS — I082 Rheumatic disorders of both aortic and tricuspid valves: Secondary | ICD-10-CM | POA: Diagnosis not present

## 2017-12-27 DIAGNOSIS — I255 Ischemic cardiomyopathy: Secondary | ICD-10-CM | POA: Diagnosis not present

## 2017-12-27 DIAGNOSIS — I5022 Chronic systolic (congestive) heart failure: Secondary | ICD-10-CM | POA: Insufficient documentation

## 2017-12-27 DIAGNOSIS — J449 Chronic obstructive pulmonary disease, unspecified: Secondary | ICD-10-CM | POA: Insufficient documentation

## 2017-12-27 DIAGNOSIS — I110 Hypertensive heart disease with heart failure: Secondary | ICD-10-CM | POA: Diagnosis not present

## 2017-12-27 NOTE — Progress Notes (Unsigned)
Patient would not give consent to the use of Def[place] for his echocardiogram.

## 2018-01-03 ENCOUNTER — Other Ambulatory Visit: Payer: Self-pay | Admitting: Family Medicine

## 2018-01-03 MED ORDER — METFORMIN HCL 500 MG PO TABS: 500.0000 mg | ORAL_TABLET | Freq: Two times a day (BID) | ORAL | 0 refills | Status: DC

## 2018-01-18 ENCOUNTER — Encounter (HOSPITAL_COMMUNITY): Payer: Self-pay | Admitting: Cardiology

## 2018-01-21 ENCOUNTER — Encounter: Payer: Medicare Other | Admitting: Cardiology

## 2018-01-26 ENCOUNTER — Ambulatory Visit (INDEPENDENT_AMBULATORY_CARE_PROVIDER_SITE_OTHER): Payer: Medicare Other | Admitting: Cardiology

## 2018-01-26 ENCOUNTER — Encounter: Payer: Self-pay | Admitting: Cardiology

## 2018-01-26 VITALS — BP 128/84 | HR 80 | Ht 70.0 in | Wt 196.2 lb

## 2018-01-26 DIAGNOSIS — I2510 Atherosclerotic heart disease of native coronary artery without angina pectoris: Secondary | ICD-10-CM

## 2018-01-26 DIAGNOSIS — I5022 Chronic systolic (congestive) heart failure: Secondary | ICD-10-CM

## 2018-01-26 LAB — CUP PACEART INCLINIC DEVICE CHECK
Implantable Lead Implant Date: 20181025
Implantable Lead Implant Date: 20181025
Implantable Lead Location: 753860
Implantable Lead Model: 5076
MDC IDC LEAD IMPLANT DT: 20181025
MDC IDC LEAD LOCATION: 753858
MDC IDC LEAD LOCATION: 753859
MDC IDC PG IMPLANT DT: 20181025
MDC IDC SESS DTM: 20190130155528

## 2018-01-26 NOTE — Progress Notes (Signed)
Electrophysiology Office Note   Date:  01/26/2018   ID:  Romy Mcgue, DOB 02-Oct-1956, MRN [id number]  PCP:  Shelda Pal, DO  Cardiologist:  Aundra Dubin Primary Electrophysiologist:  Chozen Latulippe Meredith Leeds, MD    Chief Complaint  Patient presents with  . Congestive Heart Failure  . Follow-up     History of Present Illness: Emily Forse is a 62 y.o. male who is being seen today for the evaluation of CHF at the request of Shelda Pal*. Presenting today for electrophysiology evaluation. History of ischemic cardio myopathy with an EF 15%, CKG stage II, coronary artery disease status post CABG 2 in 2010 (SVG -> OM1, LIMA -> LAD), left bundle branch block, and type 2 diabetes.  He had a Medtronic CRT-D implanted 10/21/17.  Today, denies symptoms of palpitations, chest pain, shortness of breath, orthopnea, PND, lower extremity edema, claudication, dizziness, presyncope, syncope, bleeding, or neurologic sequela. The patient is tolerating medications without difficulties.  He is felt much improved since his CRT-D was implanted.  He is able to go to the gym to work out.  He is much less short of breath.  He has essentially class I symptoms.  Past Medical History:  Diagnosis Date  . Angina at rest [facility]) 07/08/2016  . CAD (coronary artery disease) 2010   3v CABG  . Chest pain at rest 07/08/2016  . CHF (congestive heart failure) ([place]) 01/26/2012  . CKD (chronic kidney disease) 02/19/2012  . COPD with chronic bronchitis ([place]) 08/26/2016  . Diabetes mellitus type 2, uncontrolled ([place]) 01/26/2012  . Elevated liver function tests 01/26/2012  . Gout 01/26/2012  . Hx of CABG 2010   RCA Stent 2008, CABG x 2 [facility] 2010  . Hypercholesterolemia 07/08/2016  . Hypertension 09/04/2014  . Ischemic cardiomyopathy 11/15/2014  . Kidney stone 10/25/2015  . Lipid disorder 01/26/2012  . NSTEMI (non-ST elevated myocardial infarction) ([place]) 07/08/2016  . ST elevation myocardial infarction  (STEMI) ([place]) 2010  . Unstable angina ([place]) 11/14/2014   Past Surgical History:  Procedure Laterality Date  . BIV ICD INSERTION CRT-D N/A 10/21/2017   Procedure: BIV ICD INSERTION CRT-D;  Surgeon: Constance Haw, MD;  Location: [place] [facility];  Service: Cardiovascular;  Laterality: N/A;  . CORONARY ARTERY BYPASS GRAFT  2010  . KNEE SURGERY    . RIGHT/LEFT HEART CATH AND CORONARY ANGIOGRAPHY N/A 04/15/2017   Procedure: Right/Left Heart Cath and Coronary Angiography;  Surgeon: Larey Dresser, MD;  Location: [place] [facility];  Service: Cardiovascular;  Laterality: N/A;  . ROTATOR CUFF REPAIR    . WRIST SURGERY       Current Outpatient Medications  Medication Sig Dispense Refill  . albuterol (PROVENTIL HFA;VENTOLIN HFA) 108 (90 Base) MCG/ACT inhaler Inhale 2 puffs into the lungs every 6 (six) hours as needed for wheezing or shortness of breath.    Marland Kitchen albuterol (PROVENTIL) (2.5 MG/3ML) 0.083% nebulizer solution Take 3 mLs (2.5 mg total) by nebulization every 6 (six) hours as needed for wheezing or shortness of breath. 75 mL 3  . allopurinol (ZYLOPRIM) 300 MG tablet Take 300 mg by mouth 2 (two) times daily.    Marland Kitchen aspirin EC 81 MG EC tablet Take 1 tablet (81 mg total) by mouth daily. 30 tablet 6  . atorvastatin (LIPITOR) 80 MG tablet Take 1 tablet (80 mg total) by mouth daily. 30 tablet 11  . carvedilol (COREG) 6.25 MG tablet Take 1.5 tablets (9.375 mg total) by mouth 2 (two) times daily. [place]  tablet 6  . digoxin (LANOXIN) 0.125 MG tablet Take 1 tablet (0.125 mg total) by mouth daily. 90 tablet 1  . diphenhydrAMINE (BENADRYL) 25 MG tablet Take 25 mg by mouth daily as needed for itching.    Marland Kitchen glucose blood test strip Use as instructed 100 each 3  . Investigational - Study Medication Take 1 tablet by mouth 2 (two) times daily. Study name: Galactic HF Study Additional study details: Omecamtiv Mecarbil or Placebo 1 each PRN  . isosorbide-hydrALAZINE (BIDIL) 20-37.5 MG tablet Take 0.5  tablets by mouth 3 (three) times daily.    . Lancets (FREESTYLE) lancets Use to check sugars daily 100 each 3  . metFORMIN (GLUCOPHAGE) 500 MG tablet Take 1 tablet (500 mg total) by mouth 2 (two) times daily with a meal. 180 tablet 0  . pantoprazole (PROTONIX) 40 MG tablet Take 1 tablet (40 mg total) by mouth daily. 30 tablet 3  . sacubitril-valsartan (ENTRESTO) 24-26 MG Take 1 tablet by mouth 2 (two) times daily. 60 tablet 6  . spironolactone (ALDACTONE) 25 MG tablet Take 25 mg by mouth daily.    Marland Kitchen torsemide (DEMADEX) 20 MG tablet Take 20 mg 2 (two) times daily by mouth.     No current facility-administered medications for this visit.     Allergies:   Hydrocodone and Tramadol   Social History:  The patient  reports that he quit smoking about 18 months ago. He has a 15.00 pack-year smoking history. he has never used smokeless tobacco. He reports that he does not drink alcohol or use drugs.   Family History:  The patient's family history includes Diabetes in his mother; Emphysema in his father; Hyperlipidemia in his mother; Hypertension in his mother; Kidney failure in his mother.   ROS:  Please see the history of present illness.   Otherwise, review of systems is positive for none.   All other systems are reviewed and negative.   PHYSICAL EXAM: VS:  BP 128/84   Pulse 80   Ht 5\' 10"  (1.778 m)   Wt 196 lb 3.2 oz (89 kg)   SpO2 98%   BMI 28.15 kg/m  , BMI Body mass index is 28.15 kg/m. GEN: Well nourished, well developed, in no acute distress  HEENT: normal  Neck: no JVD, carotid bruits, or masses Cardiac: RRR; no murmurs, rubs, or gallops,no edema  Respiratory:  clear to auscultation bilaterally, normal work of breathing GI: soft, nontender, nondistended, + BS MS: no deformity or atrophy  Skin: warm and dry, device site well healed Neuro:  Strength and sensation are intact Psych: euthymic mood, full affect  EKG:  EKG is ordered today. Personal review of the ekg ordered shows A  sense, V paced  Personal review of the device interrogation today. Results in [place]: 05/25/2017: ALT 11 07/14/2017: Magnesium 1.6 08/20/2017: B Natriuretic Peptide 1,190.6 10/18/2017: Hemoglobin 12.3; Platelets 245 12/15/2017: BUN 20; Creatinine, Ser 1.57; Potassium 3.9; Sodium 136    Lipid Panel     Component Value Date/Time   CHOL 194 07/28/2017 0855   TRIG 176 (H) 07/28/2017 0855   HDL 29 (L) 07/28/2017 0855   CHOLHDL 6.7 07/28/2017 0855   VLDL 35 07/28/2017 0855   LDLCALC 130 (H) 07/28/2017 0855   LDLDIRECT 138.0 08/26/2016 0948     Wt Readings from Last 3 Encounters:  01/26/18 196 lb 3.2 oz (89 kg)  12/22/17 196 lb 3.2 oz (89 kg)  12/15/17 197 lb (89.4 kg)  Other studies Reviewed: Additional studies/ records that were reviewed today include: TTE 07/13/17  Review of the above records today demonstrates:  - Left ventricle: The cavity size was moderately dilated. Wall   thickness was increased in a pattern of mild LVH. Systolic   function was severely reduced. The estimated ejection fraction   was in the range of 20% to 25%. Diffuse hypokinesis. There is   akinesis of the inferior myocardium. - Ventricular septum: Septal motion showed abnormal function and   dyssynergy. - Aortic valve: There was moderate regurgitation. - Aorta: Aortic root dimension: 39 mm (ED). - Ascending aorta: The ascending aorta was mildly dilated. - Mitral valve: There was mild regurgitation. Valve area by   pressure half-time: 1.35 cm^2. - Left atrium: The atrium was moderately dilated. - Pulmonary arteries: Systolic pressure was moderately to severely   increased. PA peak pressure: 61 mm Hg (S).  LHC/RHC 04/15/17 Left Main  30% stenosis.  Left Anterior Descending  90% mid LAD stenosis. Patent LIMA-LAD.  Left Circumflex  Moderate high OM1 with luminal irregularities. Small caliber distal LCx with moderate diffuse disease. 70-80% stenosis proximally in moderate OM2.  Patent SVG-OM2.  Right Coronary Artery  30% proximal and mid RCA stenoses.    ASSESSMENT AND PLAN:  1.  Chronic systolic heart failure due to ischemic cardiomyopathy: On optimal medical therapy with Coreg, Entresto, and Aldactone.  He had a Medtronic CRT-D implanted 10/21/17.  Functioning appropriately.  He is feeling much better since the CRT has been implanted.  He does have room to increase his Entresto.  We Evelyna Folker discussed with heart failure clinic whether or not titration would be appropriate.  2. CKD stage II: Stable.  No changes.  3. Coronary artery disease status post CABG: Stable on most recent cath.  No current chest pain.  4. Left bundle branch block: Status post CRT-D implant.    Current medicines are reviewed at length with the patient today.   The patient does not have concerns regarding his medicines.  The following changes were made today: None  Labs/ tests ordered today include: CBC, BMP Orders Placed This Encounter  Procedures  . EKG 12-Lead     Disposition:   FU with Travis Mastel 9 months  Signed, Vanshika Jastrzebski Meredith Leeds, MD  01/26/2018 8:40 AM     [facility] [address] [place] [address] [phone] (office) [phone] (fax)

## 2018-01-26 NOTE — Patient Instructions (Addendum)
Medication Instructions:  Your physician recommends that you continue on your current medications as directed. Please refer to the Current Medication list given to you today.  *If you need a refill on your cardiac medications before your next appointment, please call your pharmacy*  Labwork: None ordered  Testing/Procedures: None ordered  Follow-Up: Remote monitoring is used to monitor your Pacemaker or ICD from home. This monitoring reduces the number of office visits required to check your device to one time per year. It allows [place] to keep an eye on the functioning of your device to ensure it is working properly. You are scheduled for a device check from home on 04/27/2018. You may send your transmission at any time that day. If you have a wireless device, the transmission will be sent automatically. After your physician reviews your transmission, you will receive a postcard with your next transmission date.  Your physician wants you to follow-up in: 9 months with Dr. Curt Bears.  You will receive a reminder letter in the mail two months in advance. If you don't receive a letter, please call our office to schedule the follow-up appointment.  Thank you for choosing [facility]!!   Trinidad Curet, RN [phone]  Any Other Special Instructions Will Be Listed Below (If Applicable). We will discuss increasing your Entresto with the heart failure clinic.  The office will call you to let you what to do.  Remain on prescribed dosage for now.

## 2018-01-28 ENCOUNTER — Other Ambulatory Visit: Payer: Self-pay | Admitting: Family Medicine

## 2018-01-28 DIAGNOSIS — M109 Gout, unspecified: Secondary | ICD-10-CM

## 2018-01-31 ENCOUNTER — Ambulatory Visit (INDEPENDENT_AMBULATORY_CARE_PROVIDER_SITE_OTHER): Payer: Medicare Other | Admitting: Family Medicine

## 2018-01-31 ENCOUNTER — Encounter: Payer: Self-pay | Admitting: Family Medicine

## 2018-01-31 VITALS — BP 118/70 | HR 69 | Temp 97.4°F | Ht 70.0 in | Wt 199.0 lb

## 2018-01-31 DIAGNOSIS — M109 Gout, unspecified: Secondary | ICD-10-CM | POA: Diagnosis not present

## 2018-01-31 DIAGNOSIS — Z5320 Procedure and treatment not carried out because of patient's decision for unspecified reasons: Secondary | ICD-10-CM | POA: Diagnosis not present

## 2018-01-31 DIAGNOSIS — E1165 Type 2 diabetes mellitus with hyperglycemia: Secondary | ICD-10-CM

## 2018-01-31 DIAGNOSIS — Z1159 Encounter for screening for other viral diseases: Secondary | ICD-10-CM | POA: Diagnosis not present

## 2018-01-31 LAB — CBC
HCT: 41.9 % (ref 39.0–52.0)
Hemoglobin: 13.7 g/dL (ref 13.0–17.0)
MCHC: 32.6 g/dL (ref 30.0–36.0)
MCV: 92.5 fl (ref 78.0–100.0)
PLATELETS: 201 10*3/uL (ref 150.0–400.0)
RBC: 4.53 Mil/uL (ref 4.22–5.81)
RDW: 15.9 % — ABNORMAL HIGH (ref 11.5–15.5)
WBC: 4.5 10*3/uL (ref 4.0–10.5)

## 2018-01-31 LAB — COMPREHENSIVE METABOLIC PANEL
ALBUMIN: 3.8 g/dL (ref 3.5–5.2)
ALT: 11 U/L (ref 0–53)
AST: 19 U/L (ref 0–37)
Alkaline Phosphatase: 57 U/L (ref 39–117)
BILIRUBIN TOTAL: 0.5 mg/dL (ref 0.2–1.2)
BUN: 30 mg/dL — AB (ref 6–23)
CALCIUM: 9.2 mg/dL (ref 8.4–10.5)
CHLORIDE: 98 meq/L (ref 96–112)
CO2: 29 meq/L (ref 19–32)
CREATININE: 1.73 mg/dL — AB (ref 0.40–1.50)
GFR: 51.67 mL/min — ABNORMAL LOW (ref >60.00)
Glucose, Bld: 151 mg/dL — ABNORMAL HIGH (ref 70–99)
Potassium: 3.9 mEq/L (ref 3.5–5.1)
SODIUM: 138 meq/L (ref 135–145)
Total Protein: 8.5 g/dL — ABNORMAL HIGH (ref 6.0–8.3)

## 2018-01-31 LAB — LIPID PANEL
CHOL/HDL RATIO: 6
CHOLESTEROL: 191 mg/dL (ref 0–200)
HDL: 32.3 mg/dL — ABNORMAL LOW (ref >39.00)
LDL Cholesterol: 134 mg/dL — ABNORMAL HIGH (ref 0–99)
NonHDL: 158.94
Triglycerides: 126 mg/dL (ref 0.0–149.0)
VLDL: 25.2 mg/dL (ref 0.0–40.0)

## 2018-01-31 LAB — HEMOGLOBIN A1C: HEMOGLOBIN A1C: 8.4 % — AB (ref 4.6–6.5)

## 2018-01-31 LAB — MICROALBUMIN / CREATININE URINE RATIO
CREATININE, U: 96.2 mg/dL
Microalb Creat Ratio: 141.3 mg/g — ABNORMAL HIGH (ref 0.0–30.0)
Microalb, Ur: 136 mg/dL — ABNORMAL HIGH (ref 0.0–1.9)

## 2018-01-31 LAB — URIC ACID: Uric Acid, Serum: 12.1 mg/dL — ABNORMAL HIGH (ref 4.0–7.8)

## 2018-01-31 NOTE — Patient Instructions (Signed)
Keep the diet clean.   Give [place] 2-3 business days to get the results of your labs back. If labs are normal, you will likely receive a letter in the mail unless you have MyChart. This can take longer than 2-3 business days.   If your A1c is elevated, we will call in another medicine.  Let us know if you need anything.

## 2018-01-31 NOTE — Progress Notes (Signed)
Pre visit review using our clinic review tool, if applicable. No additional management support is needed unless otherwise documented below in the visit note. 

## 2018-01-31 NOTE — Progress Notes (Signed)
Subjective:   Chief Complaint  Patient presents with  . Follow-up    Patrick Stewart is a 62 y.o. male here for follow-up of diabetes.   Patrick Stewart's self monitored glucose range is mid 100's.  Patient denies hypoglycemic reactions. He checks his glucose levels 2x/week. Patient does not require insulin.   Medications include: Metformin 500 mg BID (stomach upset on higher doses),   Patient exercises 7 days per week on average.   He does take an aspirin daily. Statin? Yes ACEi/ARB? Yes  Hx of gout of L knee, currently on allopurinol. No recent flares, has associated a certain type of canned fish as causing most of his flares.  No redness or swelling.  Past Medical History:  Diagnosis Date  . Angina at rest [facility]) 07/08/2016  . CAD (coronary artery disease) 2010   3v CABG  . Chest pain at rest 07/08/2016  . CHF (congestive heart failure) ([place]) 01/26/2012  . CKD (chronic kidney disease) 02/19/2012  . COPD with chronic bronchitis ([place]) 08/26/2016  . Diabetes mellitus type 2, uncontrolled ([place]) 01/26/2012  . Elevated liver function tests 01/26/2012  . Gout 01/26/2012  . Hx of CABG 2010   RCA Stent 2008, CABG x 2 [facility] 2010  . Hypercholesterolemia 07/08/2016  . Hypertension 09/04/2014  . Ischemic cardiomyopathy 11/15/2014  . Kidney stone 10/25/2015  . Lipid disorder 01/26/2012  . NSTEMI (non-ST elevated myocardial infarction) ([place]) 07/08/2016  . ST elevation myocardial infarction (STEMI) ([place]) 2010  . Unstable angina ([place]) 11/14/2014    Past Surgical History:  Procedure Laterality Date  . BIV ICD INSERTION CRT-D N/A 10/21/2017   Procedure: BIV ICD INSERTION CRT-D;  Surgeon: Constance Haw, MD;  Location: [facility] [facility];  Service: Cardiovascular;  Laterality: N/A;  . CORONARY ARTERY BYPASS GRAFT  2010  . KNEE SURGERY    . RIGHT/LEFT HEART CATH AND CORONARY ANGIOGRAPHY N/A 04/15/2017   Procedure: Right/Left Heart Cath and Coronary Angiography;  Surgeon: Larey Dresser, MD;   Location: [place] [facility];  Service: Cardiovascular;  Laterality: N/A;  . ROTATOR CUFF REPAIR    . WRIST SURGERY      Social History   Socioeconomic History  . Marital status: Married  Occupational History  . Occupation: disabled  Tobacco Use  . Smoking status: Former Smoker    Packs/day: 0.50    Years: 30.00    Pack years: 15.00    Last attempt to quit: 07/10/2016    Years since quitting: 1.5  . Smokeless tobacco: Never Used  Substance and Sexual Activity  . Alcohol use: No  . Drug use: No  . Sexual activity: Yes   Current Outpatient Medications on File Prior to Visit  Medication Sig Dispense Refill  . albuterol (PROVENTIL HFA;VENTOLIN HFA) 108 (90 Base) MCG/ACT inhaler Inhale 2 puffs into the lungs every 6 (six) hours as needed for wheezing or shortness of breath.    Marland Kitchen albuterol (PROVENTIL) (2.5 MG/3ML) 0.083% nebulizer solution Take 3 mLs (2.5 mg total) by nebulization every 6 (six) hours as needed for wheezing or shortness of breath. 75 mL 3  . allopurinol (ZYLOPRIM) 300 MG tablet Take 300 mg by mouth 2 (two) times daily.    Marland Kitchen aspirin EC 81 MG EC tablet Take 1 tablet (81 mg total) by mouth daily. 30 tablet 6  . atorvastatin (LIPITOR) 80 MG tablet Take 1 tablet (80 mg total) by mouth daily. 30 tablet 11  . carvedilol (COREG) 6.25 MG tablet Take 1.5 tablets (9.375 mg  total) by mouth 2 (two) times daily. 90 tablet 6  . COLCRYS 0.6 MG tablet TAKE 1 TABLET BY MOUTH TWICE A DAY 180 tablet 1  . digoxin (LANOXIN) 0.125 MG tablet Take 1 tablet (0.125 mg total) by mouth daily. 90 tablet 1  . diphenhydrAMINE (BENADRYL) 25 MG tablet Take 25 mg by mouth daily as needed for itching.    Marland Kitchen glucose blood test strip Use as instructed 100 each 3  . Investigational - Study Medication Take 1 tablet by mouth 2 (two) times daily. Study name: Galactic HF Study Additional study details: Omecamtiv Mecarbil or Placebo 1 each PRN  . isosorbide-hydrALAZINE (BIDIL) 20-37.5 MG tablet Take 0.5 tablets by  mouth 3 (three) times daily.    . Lancets (FREESTYLE) lancets Use to check sugars daily 100 each 3  . metFORMIN (GLUCOPHAGE) 500 MG tablet Take 1 tablet (500 mg total) by mouth 2 (two) times daily with a meal. 180 tablet 0  . pantoprazole (PROTONIX) 40 MG tablet Take 1 tablet (40 mg total) by mouth daily. 30 tablet 3  . sacubitril-valsartan (ENTRESTO) 24-26 MG Take 1 tablet by mouth 2 (two) times daily. 60 tablet 6  . spironolactone (ALDACTONE) 25 MG tablet Take 25 mg by mouth daily.    Marland Kitchen torsemide (DEMADEX) 20 MG tablet Take 20 mg 2 (two) times daily by mouth.     Related testing: Foot exam(monofilament and inspection):done Date of retinal exam: >1 yr Pneumovax: done Flu Shot: not done  Review of Systems: Eye:  No recent significant change in vision Pulmonary:  No SOB Cardiovascular:  No chest pain, no palpitations Skin/Integumentary ROS:  No abnormal skin lesions reported Neurologic:  No numbness, tingling  Objective:  BP 118/70 (BP Location: Left Arm, Patient Position: Sitting, Cuff Size: Large)   Pulse 69   Temp (!) 97.4 F (36.3 C) (Oral)   Ht 5\' 10"  (1.778 m)   Wt 199 lb (90.3 kg)   SpO2 98%   BMI 28.55 kg/m  General:  Well developed, well nourished, in no apparent distress Skin:  Warm, no pallor or diaphoresis Head:  Normocephalic, atraumatic Eyes:  Pupils equal and round, sclera anicteric without injection  Nose:  External nares without trauma, no discharge Throat/Pharynx:  Lips and gingiva without lesion Neck: Neck supple.  No obvious thyromegaly or masses.  No bruits Lungs:  clear to auscultation, breath sounds equal bilaterally, no wheezes, rales, or stridor Cardio:  regular rate and rhythm without murmurs, no bruits, no LE edema Musculoskeletal:  Symmetrical muscle groups noted without atrophy or deformity Neuro:  Sensation intact to pinprick on feet Psych: Age appropriate judgment and insight  Assessment:   Uncontrolled type 2 diabetes mellitus with  hyperglycemia (Patrick Stewart) - Plan: CBC, Comprehensive metabolic panel, Lipid panel, Hemoglobin A1c, Microalbumin / creatinine urine ratio, Uric acid  Gout, unspecified cause, unspecified chronicity, unspecified site  Encounter for hepatitis C screening test for low risk patient - Plan: Hepatitis C antibody  Colonoscopy refused   Plan:   Orders as above.  If his A1c is still elevated, will add TZD.  We can try SGLT-2 or GLP-1, cost may be an issue.  Counseled on diet and exercise. He refuses all forms of colon cancer screening including colonoscopy, Cologard, and FIT.  I told him this could save his life with an early diagnosis of colon cancer.  He states that he understands these risks and is convinced that he does not have colon cancer.  I did remind him that early detection  does take place in asymptomatic individuals. F/u in 3 mo. The patient voiced understanding and agreement to the plan.  [place], DO 01/31/18 10:14 AM

## 2018-02-01 ENCOUNTER — Other Ambulatory Visit: Payer: Self-pay | Admitting: Family Medicine

## 2018-02-01 LAB — HEPATITIS C ANTIBODY
Hepatitis C Ab: NONREACTIVE
SIGNAL TO CUT-OFF: 0.15 (ref <1.00)

## 2018-02-01 MED ORDER — EMPAGLIFLOZIN 10 MG PO TABS: 10.0000 mg | ORAL_TABLET | Freq: Every day | ORAL | 1 refills | Status: DC

## 2018-02-02 ENCOUNTER — Telehealth: Payer: Self-pay | Admitting: *Deleted

## 2018-02-02 ENCOUNTER — Other Ambulatory Visit (INDEPENDENT_AMBULATORY_CARE_PROVIDER_SITE_OTHER): Payer: Medicare Other

## 2018-02-02 DIAGNOSIS — R718 Other abnormality of red blood cells: Secondary | ICD-10-CM

## 2018-02-02 LAB — IBC PANEL
Iron: 41 ug/dL — ABNORMAL LOW (ref 42–165)
SATURATION RATIOS: 10.2 % — AB (ref 20.0–50.0)
TRANSFERRIN: 287 mg/dL (ref 212.0–360.0)

## 2018-02-02 NOTE — Telephone Encounter (Signed)
Pt agreeable to increasing dosing.  He understands I will address samples when I am back in the office tomorrow.  I will call him about samples and scheduling f/u appt w/ pharmacist to make sure he is maintaining ok on increased dosing and to address if titration needed. Pt agreeable to plan.

## 2018-02-02 NOTE — Telephone Encounter (Signed)
-----   Message from Will Meredith Leeds, MD sent at 01/27/2018  9:01 AM EST -----   ----- Message ----- From: Larey Dresser, MD Sent: 01/26/2018   4:08 PM To: Will Meredith Leeds, MD  Yes will need to be increased. BP is ok.  ----- Message ----- From: Constance Haw, MD Sent: 01/26/2018   9:00 AM To: Larey Dresser, MD  He is doing much better since his CRT-D was implanted.  He is on Entresto, but his dose is quite low.  It was not increased at his last visit.  Do think it would be appropriate for [place] to increase it at this point?

## 2018-02-03 MED ORDER — SACUBITRIL-VALSARTAN 49-51 MG PO TABS: 1.0000 | ORAL_TABLET | Freq: Two times a day (BID) | ORAL | 1 refills | Status: DC

## 2018-02-03 NOTE — Telephone Encounter (Signed)
2 weeks of Entresto 49/51 left at front desk for pt to pick up and start tomorrow. Pharmacist appt made for 2/26. Patient verbalized understanding and agreeable to plan.

## 2018-02-04 ENCOUNTER — Other Ambulatory Visit: Payer: Self-pay | Admitting: Family Medicine

## 2018-02-04 DIAGNOSIS — E611 Iron deficiency: Secondary | ICD-10-CM

## 2018-02-15 ENCOUNTER — Encounter (HOSPITAL_COMMUNITY): Payer: Medicare Other

## 2018-02-19 ENCOUNTER — Emergency Department (HOSPITAL_BASED_OUTPATIENT_CLINIC_OR_DEPARTMENT_OTHER): Payer: Medicare Other

## 2018-02-19 ENCOUNTER — Encounter (HOSPITAL_BASED_OUTPATIENT_CLINIC_OR_DEPARTMENT_OTHER): Payer: Self-pay | Admitting: Emergency Medicine

## 2018-02-19 ENCOUNTER — Other Ambulatory Visit: Payer: Self-pay

## 2018-02-19 ENCOUNTER — Emergency Department (HOSPITAL_BASED_OUTPATIENT_CLINIC_OR_DEPARTMENT_OTHER)
Admission: EM | Admit: 2018-02-19 | Discharge: 2018-02-19 | Disposition: A | Discharge status: Home / Self Care | Payer: Medicare Other | Attending: Emergency Medicine | Admitting: Emergency Medicine

## 2018-02-19 DIAGNOSIS — I130 Hypertensive heart and chronic kidney disease with heart failure and stage 1 through stage 4 chronic kidney disease, or unspecified chronic kidney disease: Secondary | ICD-10-CM | POA: Insufficient documentation

## 2018-02-19 DIAGNOSIS — Z7984 Long term (current) use of oral hypoglycemic drugs: Secondary | ICD-10-CM | POA: Diagnosis not present

## 2018-02-19 DIAGNOSIS — I252 Old myocardial infarction: Secondary | ICD-10-CM | POA: Insufficient documentation

## 2018-02-19 DIAGNOSIS — I5022 Chronic systolic (congestive) heart failure: Secondary | ICD-10-CM | POA: Insufficient documentation

## 2018-02-19 DIAGNOSIS — I2510 Atherosclerotic heart disease of native coronary artery without angina pectoris: Secondary | ICD-10-CM | POA: Diagnosis not present

## 2018-02-19 DIAGNOSIS — Z7982 Long term (current) use of aspirin: Secondary | ICD-10-CM | POA: Insufficient documentation

## 2018-02-19 DIAGNOSIS — I255 Ischemic cardiomyopathy: Secondary | ICD-10-CM | POA: Insufficient documentation

## 2018-02-19 DIAGNOSIS — K4090 Unilateral inguinal hernia, without obstruction or gangrene, not specified as recurrent: Secondary | ICD-10-CM | POA: Diagnosis not present

## 2018-02-19 DIAGNOSIS — J449 Chronic obstructive pulmonary disease, unspecified: Secondary | ICD-10-CM | POA: Diagnosis not present

## 2018-02-19 DIAGNOSIS — R109 Unspecified abdominal pain: Secondary | ICD-10-CM | POA: Diagnosis not present

## 2018-02-19 DIAGNOSIS — R1084 Generalized abdominal pain: Secondary | ICD-10-CM | POA: Diagnosis not present

## 2018-02-19 DIAGNOSIS — E1122 Type 2 diabetes mellitus with diabetic chronic kidney disease: Secondary | ICD-10-CM | POA: Insufficient documentation

## 2018-02-19 DIAGNOSIS — N183 Chronic kidney disease, stage 3 (moderate): Secondary | ICD-10-CM | POA: Insufficient documentation

## 2018-02-19 DIAGNOSIS — Z79899 Other long term (current) drug therapy: Secondary | ICD-10-CM | POA: Diagnosis not present

## 2018-02-19 DIAGNOSIS — Z951 Presence of aortocoronary bypass graft: Secondary | ICD-10-CM | POA: Insufficient documentation

## 2018-02-19 DIAGNOSIS — E7800 Pure hypercholesterolemia, unspecified: Secondary | ICD-10-CM | POA: Diagnosis not present

## 2018-02-19 DIAGNOSIS — Z87891 Personal history of nicotine dependence: Secondary | ICD-10-CM | POA: Diagnosis not present

## 2018-02-19 LAB — URINALYSIS, ROUTINE W REFLEX MICROSCOPIC
BILIRUBIN URINE: NEGATIVE
Glucose, UA: >=500 mg/dL — AB
KETONES UR: NEGATIVE mg/dL
Leukocytes, UA: NEGATIVE
Nitrite: NEGATIVE
PROTEIN: 100 mg/dL — AB
Specific Gravity, Urine: 1.025 (ref 1.005–1.030)
pH: 6 (ref 5.0–8.0)

## 2018-02-19 LAB — URINALYSIS, MICROSCOPIC (REFLEX)

## 2018-02-19 MED ORDER — HYDRALAZINE HCL 25 MG PO TABS
25.0000 mg | ORAL_TABLET | Freq: Once | ORAL | Status: AC
  Administered 2018-02-19: 25 mg via ORAL
  Filled 2018-02-19: qty 1

## 2018-02-19 MED ORDER — OXYCODONE-ACETAMINOPHEN 5-325 MG PO TABS: 1.0000 | ORAL_TABLET | ORAL | 0 refills | Status: DC | PRN

## 2018-02-19 MED ORDER — OXYCODONE-ACETAMINOPHEN 5-325 MG PO TABS
2.0000 | ORAL_TABLET | Freq: Once | ORAL | Status: AC
  Administered 2018-02-19: 2 via ORAL
  Filled 2018-02-19: qty 2

## 2018-02-19 MED ORDER — METOPROLOL TARTRATE 50 MG PO TABS
25.0000 mg | ORAL_TABLET | Freq: Once | ORAL | Status: AC
  Administered 2018-02-19: 25 mg via ORAL
  Filled 2018-02-19: qty 1

## 2018-02-19 MED ORDER — METHOCARBAMOL 500 MG PO TABS
750.0000 mg | ORAL_TABLET | Freq: Once | ORAL | Status: DC
Start: 2018-02-19 — End: 2018-02-19

## 2018-02-19 MED ORDER — LOSARTAN POTASSIUM 25 MG PO TABS
25.0000 mg | ORAL_TABLET | Freq: Once | ORAL | Status: DC
  Filled 2018-02-19: qty 1

## 2018-02-19 MED ORDER — CARVEDILOL 6.25 MG PO TABS
6.2500 mg | ORAL_TABLET | Freq: Two times a day (BID) | ORAL | Status: DC
  Filled 2018-02-19: qty 1

## 2018-02-19 MED ORDER — ORPHENADRINE CITRATE ER 100 MG PO TB12: 100.0000 mg | ORAL_TABLET | Freq: Two times a day (BID) | ORAL | 0 refills | Status: DC

## 2018-02-19 MED ORDER — SACUBITRIL-VALSARTAN 49-51 MG PO TABS
1.0000 | ORAL_TABLET | Freq: Once | ORAL | Status: DC
  Filled 2018-02-19: qty 1

## 2018-02-19 NOTE — ED Provider Notes (Signed)
[facility] EMERGENCY DEPARTMENT Provider Note   CSN: [id number] Arrival date & time: 02/19/18  1254     History   Chief Complaint Chief Complaint  Patient presents with  . Flank Pain    HPI Patrick Stewart is a 62 y.o. male.  HPI Patient reports he quite suddenly developed a severe pain in his left flank area last night.  Came on very suddenly.  He reports since that time he has an intense sharp pain that seems to radiate slightly towards his anterior abdomen.  Is made worse with certain movements or coughing.  No chest pain or shortness of breath.  No fever no cough.  Pain burning urgency.  No radiation into the legs.  No lower extremity symptoms.  Patient does recall a number of years ago having a kidney stone.  He cannot recall if this is what it felt like.  Members he took some medications and it resolved.  Patient did not take antihypertensive medications this morning because he was in too much pain. Past Medical History:  Diagnosis Date  . Angina at rest [facility]) 07/08/2016  . CAD (coronary artery disease) 2010   3v CABG  . Chest pain at rest 07/08/2016  . CHF (congestive heart failure) ([place]) 01/26/2012  . CKD (chronic kidney disease) 02/19/2012  . COPD with chronic bronchitis ([place]) 08/26/2016  . Diabetes mellitus type 2, uncontrolled ([place]) 01/26/2012  . Elevated liver function tests 01/26/2012  . Gout 01/26/2012  . Hx of CABG 2010   RCA Stent 2008, CABG x 2 [facility] 2010  . Hypercholesterolemia 07/08/2016  . Hypertension 09/04/2014  . Ischemic cardiomyopathy 11/15/2014  . Kidney stone 10/25/2015  . Lipid disorder 01/26/2012  . NSTEMI (non-ST elevated myocardial infarction) ([place]) 07/08/2016  . ST elevation myocardial infarction (STEMI) ([place]) 2010  . Unstable angina ([place]) 11/14/2014    Patient Active Problem List   Diagnosis Date Noted  . Uncontrolled type 2 diabetes mellitus with hyperglycemia ([place]) 01/31/2018  . CHF (congestive heart failure) ([place]) 10/21/2017    . Chest pain 07/28/2017  . Elevated troponin 07/28/2017  . Diabetes mellitus with complication ([place])   . Acute respiratory failure with hypoxia ([place])   . Hypertensive urgency 07/12/2017  . LBBB (left bundle branch block) 04/26/2017  . AKI (acute kidney injury) ([place])   . ACC/AHA stage C chronic systolic heart failure ([place]) 04/11/2017  . Diabetes mellitus type 2, uncontrolled ([place]) 09/11/2016  . COPD with chronic bronchitis ([place]) 08/26/2016  . Non-ST elevation MI (NSTEMI) ([place]) 07/08/2016  . Kidney stone on right side 10/25/2015  . CKD (chronic kidney disease), stage III ([place]) 10/13/2015  . Gout 10/13/2015  . Abnormal EKG 10/13/2015  . CAD Status post CABG 2 SVG to OM 1, LIMA to mid LAD: 10/13/2015  . Cardiomyopathy, ischemic, chronic systolic CHF 26/71/2458  . Essential hypertension 09/04/2014  . Benign prostatic hyperplasia with lower urinary tract symptoms 01/24/2013  . Peyronie's disease 12/18/2012  . Chronic joint pain 06/22/2012  . Acute on chronic systolic CHF (congestive heart failure) ([place]) 01/26/2012  . History of tobacco abuse 01/26/2012  . Inguinal hernia, right 01/26/2012  . Lipid disorder 01/26/2012    Past Surgical History:  Procedure Laterality Date  . BIV ICD INSERTION CRT-D N/A 10/21/2017   Procedure: BIV ICD INSERTION CRT-D;  Surgeon: Constance Haw, MD;  Location: [facility];  Service: Cardiovascular;  Laterality: N/A;  . CORONARY ARTERY BYPASS GRAFT  2010  . KNEE SURGERY    . RIGHT/LEFT  HEART CATH AND CORONARY ANGIOGRAPHY N/A 04/15/2017   Procedure: Right/Left Heart Cath and Coronary Angiography;  Surgeon: Larey Dresser, MD;  Location: [place] [facility];  Service: Cardiovascular;  Laterality: N/A;  . ROTATOR CUFF REPAIR    . WRIST SURGERY         Home Medications    Prior to Admission medications   Medication Sig Start Date End Date Taking? Authorizing Provider  albuterol (PROVENTIL HFA;VENTOLIN HFA) 108 (90 Base) MCG/ACT inhaler  Inhale 2 puffs into the lungs every 6 (six) hours as needed for wheezing or shortness of breath.    [provider]  albuterol (PROVENTIL) (2.5 MG/3ML) 0.083% nebulizer solution Take 3 mLs (2.5 mg total) by nebulization every 6 (six) hours as needed for wheezing or shortness of breath. 08/26/16   Shelda Pal, DO  allopurinol (ZYLOPRIM) 300 MG tablet Take 300 mg by mouth 2 (two) times daily.    [provider]  aspirin EC 81 MG EC tablet Take 1 tablet (81 mg total) by mouth daily. 04/16/17   Clegg, Amy D, NP  atorvastatin (LIPITOR) 80 MG tablet Take 1 tablet (80 mg total) by mouth daily. 08/26/16   Shelda Pal, DO  carvedilol (COREG) 6.25 MG tablet Take 1.5 tablets (9.375 mg total) by mouth 2 (two) times daily. 12/15/17   Clegg, Amy D, NP  COLCRYS 0.6 MG tablet TAKE 1 TABLET BY MOUTH TWICE A DAY 01/28/18   Wendling, Crosby Oyster, DO  digoxin (LANOXIN) 0.125 MG tablet Take 1 tablet (0.125 mg total) by mouth daily. 10/27/17   Clegg, Amy D, NP  diphenhydrAMINE (BENADRYL) 25 MG tablet Take 25 mg by mouth daily as needed for itching.    [provider]  empagliflozin (JARDIANCE) 10 MG TABS tablet Take 10 mg by mouth daily. 02/01/18   Shelda Pal, DO  glucose blood test strip Use as instructed 08/26/16   Shelda Pal, DO  Investigational - Study Medication Take 1 tablet by mouth 2 (two) times daily. Study name: Galactic HF Study Additional study details: Omecamtiv Mecarbil or Placebo 04/15/17   Larey Dresser, MD  isosorbide-hydrALAZINE (BIDIL) 20-37.5 MG tablet Take 0.5 tablets by mouth 3 (three) times daily.    [provider]  Lancets (FREESTYLE) lancets Use to check sugars daily 08/26/16   Shelda Pal, DO  metFORMIN (GLUCOPHAGE) 500 MG tablet Take 1 tablet (500 mg total) by mouth 2 (two) times daily with a meal. 01/03/18   Wendling, Crosby Oyster, DO  orphenadrine (NORFLEX) 100 MG tablet Take 1 tablet (100 mg total) by  mouth 2 (two) times daily. 02/19/18   Charlesetta Shanks, MD  oxyCODONE-acetaminophen (PERCOCET) 5-325 MG tablet Take 1-2 tablets by mouth every 4 (four) hours as needed. 02/19/18   Charlesetta Shanks, MD  pantoprazole (PROTONIX) 40 MG tablet Take 1 tablet (40 mg total) by mouth daily. 09/29/17   Larey Dresser, MD  sacubitril-valsartan (ENTRESTO) 49-51 MG Take 1 tablet by mouth 2 (two) times daily. 02/03/18   Camnitz, Ocie Doyne, MD  spironolactone (ALDACTONE) 25 MG tablet Take 25 mg by mouth daily.    [provider]  torsemide (DEMADEX) 20 MG tablet Take 20 mg 2 (two) times daily by mouth.    [provider]    Family History Family History  Problem Relation Age of Onset  . Hypertension Mother   . Diabetes Mother   . Hyperlipidemia Mother   . Kidney failure Mother   . Emphysema Father  Social History Social History   Tobacco Use  . Smoking status: Former Smoker    Packs/day: 0.50    Years: 30.00    Pack years: 15.00    Last attempt to quit: 07/10/2016    Years since quitting: 1.6  . Smokeless tobacco: Never Used  Substance Use Topics  . Alcohol use: No  . Drug use: No     Allergies   Hydrocodone and Tramadol   Review of Systems Review of Systems 10 Systems reviewed and are negative for acute change except as noted in the HPI.   Physical Exam Updated Vital Signs BP (!) 207/162 (BP Location: Right Arm)   Pulse 97   Temp 98.2 F (36.8 C) (Oral)   Resp 18   Ht 5\' 10"  (1.778 m)   Wt 89.4 kg (197 lb)   SpO2 99%   BMI 28.27 kg/m   Physical Exam  Constitutional: He is oriented to person, place, and time.  Patient is alert and appropriate.  He is nontoxic.  No respiratory distress.  He does appear to be in pain lying on his right side holding his hand on his left.  HENT:  Head: Normocephalic and atraumatic.  Mouth/Throat: Oropharynx is clear and moist.  Eyes: EOM are normal.  Neck: Neck supple.  Cardiovascular:  Heart regular, 2\6 systolic  ejection murmur.  Pulmonary/Chest: Effort normal and breath sounds normal.  Lungs are clear.  Abdominal: Soft.  Patient does not have significantly reproducible abdominal pain.  The abdomen is soft.  Does perceive pain that radiates anteriorly with palpation in his flank and back area.  Musculoskeletal:  Patient has area of reproducible pain that is at approximately the flank to the lower thoracic wall on the left side.  This does seem to concentrate somewhat in the paraspinous muscle body area.  Palpation here is very tender.  No appreciable skin changes.  No appreciable soft tissue mass or fullness.  1-2+ pitting edema bilateral lower extremities.  Skin thinning.  Calves nontender.  Neurological: He is alert and oriented to person, place, and time. No cranial nerve deficit. He exhibits normal muscle tone. Coordination normal.  Skin: Skin is warm and dry.  Psychiatric: He has a normal mood and affect.     ED Treatments / Results  Labs (all labs ordered are listed, but only abnormal results are displayed) Labs Reviewed  URINALYSIS, ROUTINE W REFLEX MICROSCOPIC - Abnormal; Notable for the following components:      Result Value   Glucose, UA >=500 (*)    Hgb urine dipstick TRACE (*)    Protein, ur 100 (*)    All other components within normal limits  URINALYSIS, MICROSCOPIC (REFLEX) - Abnormal; Notable for the following components:   Bacteria, UA MANY (*)    Squamous Epithelial / LPF 0-5 (*)    All other components within normal limits    EKG  EKG Interpretation None       Radiology Ct Renal Stone Study  Result Date: 02/19/2018 CLINICAL DATA:  62 year old male with acute LEFT flank and abdominal pain with nausea. EXAM: CT ABDOMEN AND PELVIS WITHOUT CONTRAST TECHNIQUE: Multidetector CT imaging of the abdomen and pelvis was performed following the standard protocol without IV contrast. COMPARISON:  10/13/2015 CT FINDINGS: Please note that parenchymal abnormalities may be missed  without intravenous contrast. Lower chest: No acute abnormality. Cardiomegaly and pacemaker/ICD leads again noted. Hepatobiliary: The liver and gallbladder are unremarkable except for stable 1 cm hypodense RIGHT hepatic lesion compatible with a  benign process. No biliary dilatation. Pancreas: Unremarkable Spleen: Unremarkable Adrenals/Urinary Tract: The kidneys, adrenal glands and bladder are unremarkable. No evidence of hydronephrosis or urinary calculi. Stomach/Bowel: Stomach is within normal limits. Appendix appears normal. No evidence of bowel wall thickening, distention, or inflammatory changes. Vascular/Lymphatic: Aortic atherosclerosis. No enlarged abdominal or pelvic lymph nodes. Reproductive: Prostate enlargement again noted. Other: No ascites, focal collection or pneumoperitoneum. Bilateral inguinal hernias containing fat are again noted, RIGHT greater than LEFT. Musculoskeletal: No acute or suspicious abnormalities. IMPRESSION: 1. No evidence of acute abnormality 2. Cardiomegaly, prostate enlargement and bilateral inguinal hernias containing fat again noted. 3.  Aortic Atherosclerosis (ICD10-I70.0). Electronically Signed   By: Margarette Canada M.D.   On: 02/19/2018 16:33    Procedures Procedures (including critical care time)  Medications Ordered in ED Medications  methocarbamol (ROBAXIN) tablet 750 mg (750 mg Oral Not Given 02/19/18 1612)  losartan (COZAAR) tablet 25 mg (25 mg Oral Not Given 02/19/18 1611)  oxyCODONE-acetaminophen (PERCOCET/ROXICET) 5-325 MG per tablet 2 tablet (2 tablets Oral Given 02/19/18 1515)  hydrALAZINE (APRESOLINE) tablet 25 mg (25 mg Oral Given 02/19/18 1533)  metoprolol tartrate (LOPRESSOR) tablet 25 mg (25 mg Oral Given 02/19/18 1533)     Initial Impression / Assessment and Plan / ED Course  I have reviewed the triage vital signs and the nursing notes.  Pertinent labs & imaging results that were available during my care of the patient were reviewed by me and  considered in my medical decision making (see chart for details).    Recheck 16: 40 patient feels much improved.  He is now able to lie on his back.  He reports still some pain with certain movements but much more comfortable.  Final Clinical Impressions(s) / ED Diagnoses   Final diagnoses:  Flank pain  CT does not show any kidney stone or other immediate pathology.  Patient is clinically well in appearance.  Pain is reproducible with movement and palpation.  Suggestive of musculoskeletal pain.  Patient is however counseled on watching for rash of zoster.  Return precautions reviewed.  Patient to return if worsening or changing pain or new associated symptoms.  Patient was noncompliant with blood pressure medications this morning.  We did not have the same medications available.  Substitutions made with administration of hydralazine and metoprolol.  Patient has no symptoms of endorgan damage.  Is to resume his medications as prescribed.  ED Discharge Orders        Ordered    oxyCODONE-acetaminophen (PERCOCET) 5-325 MG tablet  Every 4 hours PRN     02/19/18 1640    orphenadrine (NORFLEX) 100 MG tablet  2 times daily     02/19/18 1640       Charlesetta Shanks, MD 02/19/18 [phone]

## 2018-02-19 NOTE — Discharge Instructions (Signed)
1.  If you see a blister looking rash develop in the area of your pain, you must be seen again to get evaluated for shingles. 2.  At this time your pain seems most likely to be muscle spasm and strain.  Take muscle relaxers and 1-2 Percocet as needed for pain control. 3.  See your family doctor for recheck this week.  Return to the emergency department if your symptoms worsen or new symptoms develop.

## 2018-02-19 NOTE — ED Triage Notes (Signed)
L flank pain since last night. Denies N/V/D

## 2018-02-19 NOTE — ED Notes (Signed)
ED Provider at bedside. 

## 2018-02-22 ENCOUNTER — Ambulatory Visit: Payer: Medicare Other | Admitting: Pharmacist

## 2018-02-22 NOTE — Progress Notes (Deleted)
Patient ID: Patrick Stewart                 DOB: 05-Aug-1956                      MRN: [id number]     HPI: Patrick Stewart is a 62 y.o. male referred by Dr. Curt Bears to pharmacy clinic for HF medication optimization. PMH is significant for ischemic cardiomyopathy with an EF of 15%, CKD stage II, CAD s/p CABG x2 in 2010 and NSTEMI in 2017, LBBB, and DM2. Medtronic CRT-D was implanted 10/21/17. Entresto dose was increased to 49-51mg  BID on 02/02/18 and pt presents today for further management.  Needs bmet today, increase carvedilol to 12.5mg  BID  Current HF meds: carvedilol 9.375mg  BID, Entresto 49-51mg  BID, spironolactone 25mg  daily, torsemide 20mg  BID BP goal: <130/37mmHg  Family History: The patient's family history includes Diabetes in his mother; Emphysema in his father; Hyperlipidemia in his mother; Hypertension in his mother; Kidney failure in his mother.   Social History: The patient  reports that he quit smoking about 18 months ago. He has a 15.00 pack-year smoking history. he has never used smokeless tobacco. He reports that he does not drink alcohol or use drugs.   Diet:   Exercise:   Home BP readings:   Wt Readings from Last 3 Encounters:  02/19/18 197 lb (89.4 kg)  01/31/18 199 lb (90.3 kg)  01/26/18 196 lb 3.2 oz (89 kg)   BP Readings from Last 3 Encounters:  02/19/18 (!) 187/110  01/31/18 118/70  01/26/18 128/84   Pulse Readings from Last 3 Encounters:  02/19/18 87  01/31/18 69  01/26/18 80    Renal function: CrCl cannot be calculated (Patient's most recent lab result is older than the maximum 21 days allowed.).  Past Medical History:  Diagnosis Date  . Angina at rest [facility]) 07/08/2016  . CAD (coronary artery disease) 2010   3v CABG  . Chest pain at rest 07/08/2016  . CHF (congestive heart failure) ([place]) 01/26/2012  . CKD (chronic kidney disease) 02/19/2012  . COPD with chronic bronchitis ([place]) 08/26/2016  . Diabetes mellitus type 2, uncontrolled ([place]) 01/26/2012    . Elevated liver function tests 01/26/2012  . Gout 01/26/2012  . Hx of CABG 2010   RCA Stent 2008, CABG x 2 [facility] 2010  . Hypercholesterolemia 07/08/2016  . Hypertension 09/04/2014  . Ischemic cardiomyopathy 11/15/2014  . Kidney stone 10/25/2015  . Lipid disorder 01/26/2012  . NSTEMI (non-ST elevated myocardial infarction) ([place]) 07/08/2016  . ST elevation myocardial infarction (STEMI) ([place]) 2010  . Unstable angina ([place]) 11/14/2014    Current Outpatient Medications on File Prior to Visit  Medication Sig Dispense Refill  . albuterol (PROVENTIL HFA;VENTOLIN HFA) 108 (90 Base) MCG/ACT inhaler Inhale 2 puffs into the lungs every 6 (six) hours as needed for wheezing or shortness of breath.    Marland Kitchen albuterol (PROVENTIL) (2.5 MG/3ML) 0.083% nebulizer solution Take 3 mLs (2.5 mg total) by nebulization every 6 (six) hours as needed for wheezing or shortness of breath. 75 mL 3  . allopurinol (ZYLOPRIM) 300 MG tablet Take 300 mg by mouth 2 (two) times daily.    Marland Kitchen aspirin EC 81 MG EC tablet Take 1 tablet (81 mg total) by mouth daily. 30 tablet 6  . atorvastatin (LIPITOR) 80 MG tablet Take 1 tablet (80 mg total) by mouth daily. 30 tablet 11  . carvedilol (COREG) 6.25 MG tablet Take 1.5 tablets (9.375 mg total) by mouth  2 (two) times daily. 90 tablet 6  . COLCRYS 0.6 MG tablet TAKE 1 TABLET BY MOUTH TWICE A DAY 180 tablet 1  . digoxin (LANOXIN) 0.125 MG tablet Take 1 tablet (0.125 mg total) by mouth daily. 90 tablet 1  . diphenhydrAMINE (BENADRYL) 25 MG tablet Take 25 mg by mouth daily as needed for itching.    . empagliflozin (JARDIANCE) 10 MG TABS tablet Take 10 mg by mouth daily. 30 tablet 1  . glucose blood test strip Use as instructed 100 each 3  . Investigational - Study Medication Take 1 tablet by mouth 2 (two) times daily. Study name: Galactic HF Study Additional study details: Omecamtiv Mecarbil or Placebo 1 each PRN  . isosorbide-hydrALAZINE (BIDIL) 20-37.5 MG tablet Take 0.5 tablets by mouth 3  (three) times daily.    . Lancets (FREESTYLE) lancets Use to check sugars daily 100 each 3  . metFORMIN (GLUCOPHAGE) 500 MG tablet Take 1 tablet (500 mg total) by mouth 2 (two) times daily with a meal. 180 tablet 0  . orphenadrine (NORFLEX) 100 MG tablet Take 1 tablet (100 mg total) by mouth 2 (two) times daily. 30 tablet 0  . oxyCODONE-acetaminophen (PERCOCET) 5-325 MG tablet Take 1-2 tablets by mouth every 4 (four) hours as needed. 20 tablet 0  . pantoprazole (PROTONIX) 40 MG tablet Take 1 tablet (40 mg total) by mouth daily. 30 tablet 3  . sacubitril-valsartan (ENTRESTO) 49-51 MG Take 1 tablet by mouth 2 (two) times daily. 60 tablet 1  . spironolactone (ALDACTONE) 25 MG tablet Take 25 mg by mouth daily.    Marland Kitchen torsemide (DEMADEX) 20 MG tablet Take 20 mg 2 (two) times daily by mouth.     No current facility-administered medications on file prior to visit.     Allergies  Allergen Reactions  . Hydrocodone Itching  . Tramadol Nausea Only     Assessment/Plan:  1. Hypertension -

## 2018-03-09 ENCOUNTER — Encounter: Payer: Self-pay | Admitting: Family Medicine

## 2018-03-09 ENCOUNTER — Other Ambulatory Visit: Payer: Self-pay | Admitting: Family Medicine

## 2018-03-09 ENCOUNTER — Ambulatory Visit (INDEPENDENT_AMBULATORY_CARE_PROVIDER_SITE_OTHER): Payer: Medicare Other | Admitting: Family Medicine

## 2018-03-09 VITALS — BP 132/84 | HR 75 | Temp 98.3°F | Ht 70.0 in | Wt 200.5 lb

## 2018-03-09 DIAGNOSIS — E1165 Type 2 diabetes mellitus with hyperglycemia: Secondary | ICD-10-CM

## 2018-03-09 DIAGNOSIS — M1A9XX0 Chronic gout, unspecified, without tophus (tophi): Secondary | ICD-10-CM | POA: Diagnosis not present

## 2018-03-09 LAB — BASIC METABOLIC PANEL
BUN: 26 mg/dL — AB (ref 6–23)
CALCIUM: 9.5 mg/dL (ref 8.4–10.5)
CHLORIDE: 102 meq/L (ref 96–112)
CO2: 28 mEq/L (ref 19–32)
CREATININE: 1.7 mg/dL — AB (ref 0.40–1.50)
GFR: 52.71 mL/min — ABNORMAL LOW (ref >60.00)
GLUCOSE: 139 mg/dL — AB (ref 70–99)
Potassium: 4.2 mEq/L (ref 3.5–5.1)
Sodium: 137 mEq/L (ref 135–145)

## 2018-03-09 LAB — URIC ACID: Uric Acid, Serum: 9.5 mg/dL — ABNORMAL HIGH (ref 4.0–7.8)

## 2018-03-09 MED ORDER — ALLOPURINOL 100 MG PO TABS: 100.0000 mg | ORAL_TABLET | Freq: Every day | ORAL | 6 refills | Status: DC

## 2018-03-09 NOTE — Progress Notes (Signed)
Subjective:   Chief Complaint  Patient presents with  . Diabetes  . Gout    Patrick Stewart is a 62 y.o. male here for follow-up of diabetes.   Patrick Stewart's self monitored glucose range is 140's.  Patient denies hypoglycemic reactions. He checks his glucose levels 1 time per week. Patient does not require insulin.   Medications include: Metformin 500 mg BID, Jardiance 10 mg/d Patient exercises 0 days per week on average.   He does take an aspirin daily. Statin? Yes ACEi/ARB? Yes  Hx of gout on allopurinol 300 mg/d. Colchicine for flares. His last uric acid was 12.1, no recent flares. Reports compliance with meds. Does his best to avoid aggravating foods.   Past Medical History:  Diagnosis Date  . Angina at rest [facility]) 07/08/2016  . CAD (coronary artery disease) 2010   3v CABG  . Chest pain at rest 07/08/2016  . CHF (congestive heart failure) ([place]) 01/26/2012  . CKD (chronic kidney disease) 02/19/2012  . COPD with chronic bronchitis ([place]) 08/26/2016  . Diabetes mellitus type 2, uncontrolled ([place]) 01/26/2012  . Elevated liver function tests 01/26/2012  . Gout 01/26/2012  . Hx of CABG 2010   RCA Stent 2008, CABG x 2 [facility] 2010  . Hypercholesterolemia 07/08/2016  . Hypertension 09/04/2014  . Ischemic cardiomyopathy 11/15/2014  . Kidney stone 10/25/2015  . Lipid disorder 01/26/2012  . NSTEMI (non-ST elevated myocardial infarction) ([place]) 07/08/2016  . ST elevation myocardial infarction (STEMI) ([place]) 2010  . Unstable angina ([place]) 11/14/2014    Past Surgical History:  Procedure Laterality Date  . BIV ICD INSERTION CRT-D N/A 10/21/2017   Procedure: BIV ICD INSERTION CRT-D;  Surgeon: Constance Haw, MD;  Location: [facility];  Service: Cardiovascular;  Laterality: N/A;  . CORONARY ARTERY BYPASS GRAFT  2010  . KNEE SURGERY    . RIGHT/LEFT HEART CATH AND CORONARY ANGIOGRAPHY N/A 04/15/2017   Procedure: Right/Left Heart Cath and Coronary Angiography;  Surgeon: Larey Dresser, MD;  Location: [facility];  Service: Cardiovascular;  Laterality: N/A;  . ROTATOR CUFF REPAIR    . WRIST SURGERY      Social History   Socioeconomic History  . Marital status: Married  Occupational History  . Occupation: disabled  Tobacco Use  . Smoking status: Former Smoker    Packs/day: 0.50    Years: 30.00    Pack years: 15.00    Last attempt to quit: 07/10/2016    Years since quitting: 1.6  . Smokeless tobacco: Never Used  Substance and Sexual Activity  . Alcohol use: No  . Drug use: No  . Sexual activity: Yes   Current Outpatient Medications on File Prior to Visit  Medication Sig Dispense Refill  . albuterol (PROVENTIL HFA;VENTOLIN HFA) 108 (90 Base) MCG/ACT inhaler Inhale 2 puffs into the lungs every 6 (six) hours as needed for wheezing or shortness of breath.    Marland Kitchen albuterol (PROVENTIL) (2.5 MG/3ML) 0.083% nebulizer solution Take 3 mLs (2.5 mg total) by nebulization every 6 (six) hours as needed for wheezing or shortness of breath. 75 mL 3  . allopurinol (ZYLOPRIM) 300 MG tablet Take 300 mg by mouth 2 (two) times daily.    Marland Kitchen aspirin EC 81 MG EC tablet Take 1 tablet (81 mg total) by mouth daily. 30 tablet 6  . atorvastatin (LIPITOR) 80 MG tablet Take 1 tablet (80 mg total) by mouth daily. 30 tablet 11  . carvedilol (COREG) 6.25 MG tablet Take 1.5 tablets (9.375  mg total) by mouth 2 (two) times daily. 90 tablet 6  . COLCRYS 0.6 MG tablet TAKE 1 TABLET BY MOUTH TWICE A DAY 180 tablet 1  . digoxin (LANOXIN) 0.125 MG tablet Take 1 tablet (0.125 mg total) by mouth daily. 90 tablet 1  . diphenhydrAMINE (BENADRYL) 25 MG tablet Take 25 mg by mouth daily as needed for itching.    . empagliflozin (JARDIANCE) 10 MG TABS tablet Take 10 mg by mouth daily. 30 tablet 1  . glucose blood test strip Use as instructed 100 each 3  . Investigational - Study Medication Take 1 tablet by mouth 2 (two) times daily. Study name: Galactic HF Study Additional study details: Omecamtiv  Mecarbil or Placebo 1 each PRN  . isosorbide-hydrALAZINE (BIDIL) 20-37.5 MG tablet Take 0.5 tablets by mouth 3 (three) times daily.    . Lancets (FREESTYLE) lancets Use to check sugars daily 100 each 3  . metFORMIN (GLUCOPHAGE) 500 MG tablet Take 1 tablet (500 mg total) by mouth 2 (two) times daily with a meal. 180 tablet 0  . orphenadrine (NORFLEX) 100 MG tablet Take 1 tablet (100 mg total) by mouth 2 (two) times daily. 30 tablet 0  . oxyCODONE-acetaminophen (PERCOCET) 5-325 MG tablet Take 1-2 tablets by mouth every 4 (four) hours as needed. 20 tablet 0  . pantoprazole (PROTONIX) 40 MG tablet Take 1 tablet (40 mg total) by mouth daily. 30 tablet 3  . sacubitril-valsartan (ENTRESTO) 49-51 MG Take 1 tablet by mouth 2 (two) times daily. 60 tablet 1  . spironolactone (ALDACTONE) 25 MG tablet Take 25 mg by mouth daily.    Marland Kitchen torsemide (DEMADEX) 20 MG tablet Take 20 mg 2 (two) times daily by mouth.      Related testing: Foot exam(monofilament and inspection):done Retinal exam:done Date of retinal exam: Refuses eye exam Pneumovax: done  Review of Systems: Eye:  No recent significant change in vision Pulmonary:  No SOB Cardiovascular:  No chest pain, no palpitations  Objective:  BP 132/84 (BP Location: Left Arm, Patient Position: Sitting, Cuff Size: Large)   Pulse 75   Temp 98.3 F (36.8 C) (Oral)   Ht 5\' 10"  (1.778 m)   Wt 200 lb 8 oz (90.9 kg)   SpO2 94%   BMI 28.77 kg/m  General:  Well developed, well nourished, in no apparent distress Skin:  Warm, no pallor or diaphoresis Head:  Normocephalic, atraumatic Eyes:  Pupils equal and round, sclera anicteric without injection  Nose:  External nares without trauma, no discharge Throat/Pharynx:  Lips and gingiva without lesion Neck: Neck supple.  No obvious thyromegaly or masses.  No bruits Lungs:  clear to auscultation, breath sounds equal bilaterally, no wheezes, rales, or stridor Cardio:  regular rate and rhythm, no LE edema Abdomen:   Abdomen soft, non-tender, BS normal Musculoskeletal:  Symmetrical muscle groups noted without atrophy or deformity Psych: Age appropriate judgment and insight  Assessment:   Uncontrolled type 2 diabetes mellitus with hyperglycemia (HCC)  Chronic gout without tophus, unspecified cause, unspecified site - Plan: Basic metabolic panel, Uric acid   Plan:   Orders as above. Based on BMP, may increase Jardiance to 25 mg/d.  May need to increase allopurinol.  Counseled on diet and exercise. He refused to see an eye doctor. I did tell him that this could save his vision, to which he replied "just let me go blind". He also refused a colonoscopy, which I told him could be life saving. He said he has no problems (despite  being told that it is meant to detect cancer or precancerous lesions before symptoms arise) and does not wish to get it.  F/u in 3 mo. The patient voiced understanding and agreement to the plan.  [place], DO 03/09/18 12:16 PM

## 2018-03-09 NOTE — Patient Instructions (Addendum)
We may increase the dose of the Jardiance based on your reading today.   We may increase the dose of your Allopurinol pending your labs.   Aim to do some physical exertion for 150 minutes per week. This is typically divided into 5 days per week, 30 minutes per day. The activity should be enough to get your heart rate up. Anything is better than nothing if you have time constraints.  Keep the diet clean.   Let us know if you need anything.

## 2018-03-09 NOTE — Progress Notes (Signed)
Pre visit review using our clinic review tool, if applicable. No additional management support is needed unless otherwise documented below in the visit note. 

## 2018-03-11 ENCOUNTER — Other Ambulatory Visit (HOSPITAL_COMMUNITY): Payer: Self-pay | Admitting: *Deleted

## 2018-03-11 MED ORDER — PANTOPRAZOLE SODIUM 40 MG PO TBEC: 40.0000 mg | DELAYED_RELEASE_TABLET | Freq: Every day | ORAL | 3 refills | Status: DC

## 2018-03-16 ENCOUNTER — Encounter: Payer: Medicare Other | Admitting: *Deleted

## 2018-03-16 VITALS — BP 135/79 | HR 60 | Wt 194.6 lb

## 2018-03-16 DIAGNOSIS — Z006 Encounter for examination for normal comparison and control in clinical research program: Secondary | ICD-10-CM

## 2018-03-16 NOTE — Progress Notes (Signed)
RESEARCH ENCOUNTER  Patient ID: Patrick Stewart  DOB: [birth year]  Patrick Stewart presented to the [facility] for the Week 48 visit of the [facility].  No signs/symptoms of ACS since the last visit. All study procedures completed without event.   Subject returned IP and additional IP dispensed.  Subject is not compliant with study IP.  Subject stated that he "ran out of pills."  RN showed the subject the remaining boxes with IP.  Discussed importance of compliance with all medication, especially with improvement in heart failure symptoms over the past few months.  Subject verbalized understanding.  Subject stated that he took a dose of IP yesterday.    [facility].

## 2018-04-04 ENCOUNTER — Encounter: Payer: Self-pay | Admitting: Family Medicine

## 2018-04-06 ENCOUNTER — Encounter: Payer: Self-pay | Admitting: Family Medicine

## 2018-04-06 DIAGNOSIS — Z91199 Patient's noncompliance with other medical treatment and regimen due to unspecified reason: Secondary | ICD-10-CM | POA: Insufficient documentation

## 2018-04-06 DIAGNOSIS — Z5329 Procedure and treatment not carried out because of patient's decision for other reasons: Secondary | ICD-10-CM | POA: Insufficient documentation

## 2018-04-27 ENCOUNTER — Ambulatory Visit (INDEPENDENT_AMBULATORY_CARE_PROVIDER_SITE_OTHER): Payer: Medicare Other | Admitting: *Deleted

## 2018-04-27 ENCOUNTER — Telehealth: Payer: Self-pay | Admitting: Cardiology

## 2018-04-27 DIAGNOSIS — I255 Ischemic cardiomyopathy: Secondary | ICD-10-CM

## 2018-04-27 NOTE — Telephone Encounter (Signed)
Spoke with pt and reminded pt of remote transmission that is due today. Pt verbalized understanding.   

## 2018-04-28 ENCOUNTER — Encounter: Payer: Self-pay | Admitting: Cardiology

## 2018-04-28 LAB — CUP PACEART REMOTE DEVICE CHECK
Battery Remaining Longevity: 113 mo
Battery Voltage: 3.02 V
Brady Statistic AP VP Percent: 6.11 %
Brady Statistic RA Percent Paced: 6.15 %
Brady Statistic RV Percent Paced: 4.27 %
Date Time Interrogation Session: 20190501153405
HIGH POWER IMPEDANCE MEASURED VALUE: 60 Ohm
Implantable Lead Implant Date: 20181025
Implantable Lead Location: 753859
Implantable Lead Model: 4398
Implantable Pulse Generator Implant Date: 20181025
Lead Channel Impedance Value: 223.149
Lead Channel Impedance Value: 228 Ohm
Lead Channel Impedance Value: 323 Ohm
Lead Channel Impedance Value: 399 Ohm
Lead Channel Impedance Value: 437 Ohm
Lead Channel Impedance Value: 456 Ohm
Lead Channel Impedance Value: 779 Ohm
Lead Channel Impedance Value: 817 Ohm
Lead Channel Pacing Threshold Amplitude: 0.875 V
Lead Channel Pacing Threshold Pulse Width: 0.4 ms
Lead Channel Pacing Threshold Pulse Width: 0.4 ms
Lead Channel Setting Pacing Amplitude: 2 V
Lead Channel Setting Pacing Amplitude: 2.5 V
Lead Channel Setting Pacing Pulse Width: 0.4 ms
Lead Channel Setting Sensing Sensitivity: 0.3 mV
MDC IDC LEAD IMPLANT DT: 20181025
MDC IDC LEAD IMPLANT DT: 20181025
MDC IDC LEAD LOCATION: 753858
MDC IDC LEAD LOCATION: 753860
MDC IDC MSMT LEADCHNL LV IMPEDANCE VALUE: 212.8 Ohm
MDC IDC MSMT LEADCHNL LV IMPEDANCE VALUE: 212.8 Ohm
MDC IDC MSMT LEADCHNL LV IMPEDANCE VALUE: 223.149
MDC IDC MSMT LEADCHNL LV IMPEDANCE VALUE: 456 Ohm
MDC IDC MSMT LEADCHNL LV IMPEDANCE VALUE: 456 Ohm
MDC IDC MSMT LEADCHNL LV IMPEDANCE VALUE: 513 Ohm
MDC IDC MSMT LEADCHNL LV IMPEDANCE VALUE: 722 Ohm
MDC IDC MSMT LEADCHNL LV IMPEDANCE VALUE: 760 Ohm
MDC IDC MSMT LEADCHNL LV IMPEDANCE VALUE: 779 Ohm
MDC IDC MSMT LEADCHNL LV PACING THRESHOLD AMPLITUDE: 1.125 V
MDC IDC MSMT LEADCHNL LV PACING THRESHOLD PULSEWIDTH: 0.5 ms
MDC IDC MSMT LEADCHNL RA SENSING INTR AMPL: 3.375 mV
MDC IDC MSMT LEADCHNL RA SENSING INTR AMPL: 3.375 mV
MDC IDC MSMT LEADCHNL RV IMPEDANCE VALUE: 437 Ohm
MDC IDC MSMT LEADCHNL RV PACING THRESHOLD AMPLITUDE: 0.75 V
MDC IDC MSMT LEADCHNL RV SENSING INTR AMPL: 19.25 mV
MDC IDC MSMT LEADCHNL RV SENSING INTR AMPL: 19.25 mV
MDC IDC SET LEADCHNL LV PACING AMPLITUDE: 1.75 V
MDC IDC SET LEADCHNL LV PACING PULSEWIDTH: 0.5 ms
MDC IDC STAT BRADY AP VS PERCENT: 0.11 %
MDC IDC STAT BRADY AS VP PERCENT: 91.26 %
MDC IDC STAT BRADY AS VS PERCENT: 2.52 %

## 2018-04-28 NOTE — Progress Notes (Signed)
Remote ICD transmission.   

## 2018-05-02 ENCOUNTER — Ambulatory Visit: Payer: Medicare Other | Admitting: Family Medicine

## 2018-05-02 DIAGNOSIS — Z0289 Encounter for other administrative examinations: Secondary | ICD-10-CM

## 2018-05-03 ENCOUNTER — Encounter: Payer: Self-pay | Admitting: Family Medicine

## 2018-05-07 ENCOUNTER — Other Ambulatory Visit: Payer: Self-pay | Admitting: Family Medicine

## 2018-05-07 DIAGNOSIS — J449 Chronic obstructive pulmonary disease, unspecified: Secondary | ICD-10-CM

## 2018-05-21 ENCOUNTER — Encounter (HOSPITAL_COMMUNITY): Payer: Self-pay | Admitting: Emergency Medicine

## 2018-05-21 ENCOUNTER — Emergency Department (HOSPITAL_COMMUNITY)
Admission: EM | Admit: 2018-05-21 | Discharge: 2018-05-21 | Disposition: A | Discharge status: Home / Self Care | Payer: Medicare Other | Attending: Emergency Medicine | Admitting: Emergency Medicine

## 2018-05-21 ENCOUNTER — Other Ambulatory Visit: Payer: Self-pay

## 2018-05-21 ENCOUNTER — Emergency Department (HOSPITAL_COMMUNITY): Payer: Medicare Other

## 2018-05-21 DIAGNOSIS — I130 Hypertensive heart and chronic kidney disease with heart failure and stage 1 through stage 4 chronic kidney disease, or unspecified chronic kidney disease: Secondary | ICD-10-CM | POA: Insufficient documentation

## 2018-05-21 DIAGNOSIS — Z7984 Long term (current) use of oral hypoglycemic drugs: Secondary | ICD-10-CM | POA: Insufficient documentation

## 2018-05-21 DIAGNOSIS — J449 Chronic obstructive pulmonary disease, unspecified: Secondary | ICD-10-CM | POA: Insufficient documentation

## 2018-05-21 DIAGNOSIS — Z87891 Personal history of nicotine dependence: Secondary | ICD-10-CM | POA: Diagnosis not present

## 2018-05-21 DIAGNOSIS — I5023 Acute on chronic systolic (congestive) heart failure: Secondary | ICD-10-CM

## 2018-05-21 DIAGNOSIS — Z951 Presence of aortocoronary bypass graft: Secondary | ICD-10-CM | POA: Diagnosis not present

## 2018-05-21 DIAGNOSIS — N183 Chronic kidney disease, stage 3 (moderate): Secondary | ICD-10-CM | POA: Insufficient documentation

## 2018-05-21 DIAGNOSIS — Z79899 Other long term (current) drug therapy: Secondary | ICD-10-CM | POA: Insufficient documentation

## 2018-05-21 DIAGNOSIS — I2510 Atherosclerotic heart disease of native coronary artery without angina pectoris: Secondary | ICD-10-CM | POA: Insufficient documentation

## 2018-05-21 DIAGNOSIS — I110 Hypertensive heart disease with heart failure: Secondary | ICD-10-CM | POA: Diagnosis not present

## 2018-05-21 DIAGNOSIS — R0602 Shortness of breath: Secondary | ICD-10-CM | POA: Diagnosis not present

## 2018-05-21 DIAGNOSIS — Z7982 Long term (current) use of aspirin: Secondary | ICD-10-CM | POA: Diagnosis not present

## 2018-05-21 DIAGNOSIS — R05 Cough: Secondary | ICD-10-CM | POA: Diagnosis not present

## 2018-05-21 DIAGNOSIS — E1165 Type 2 diabetes mellitus with hyperglycemia: Secondary | ICD-10-CM | POA: Diagnosis not present

## 2018-05-21 LAB — BASIC METABOLIC PANEL
Anion gap: 10 (ref 5–15)
BUN: 24 mg/dL — ABNORMAL HIGH (ref 6–20)
CALCIUM: 9.1 mg/dL (ref 8.9–10.3)
CO2: 24 mmol/L (ref 22–32)
CREATININE: 1.81 mg/dL — AB (ref 0.61–1.24)
Chloride: 102 mmol/L (ref 101–111)
GFR, EST AFRICAN AMERICAN: 45 mL/min — AB (ref >60)
GFR, EST NON AFRICAN AMERICAN: 38 mL/min — AB (ref >60)
Glucose, Bld: 182 mg/dL — ABNORMAL HIGH (ref 65–99)
Potassium: 4.3 mmol/L (ref 3.5–5.1)
SODIUM: 136 mmol/L (ref 135–145)

## 2018-05-21 LAB — DIFFERENTIAL
Abs Immature Granulocytes: 0 10*3/uL (ref 0.0–0.1)
Basophils Absolute: 0 10*3/uL (ref 0.0–0.1)
Basophils Relative: 1 %
Eosinophils Absolute: 0.2 10*3/uL (ref 0.0–0.7)
Eosinophils Relative: 5 %
Immature Granulocytes: 0 %
Lymphocytes Relative: 21 %
Lymphs Abs: 1 10*3/uL (ref 0.7–4.0)
MONO ABS: 0.6 10*3/uL (ref 0.1–1.0)
MONOS PCT: 14 %
NEUTROS ABS: 2.7 10*3/uL (ref 1.7–7.7)
NEUTROS PCT: 59 %

## 2018-05-21 LAB — CBC
HCT: 40.4 % (ref 39.0–52.0)
Hemoglobin: 12.8 g/dL — ABNORMAL LOW (ref 13.0–17.0)
MCH: 29.8 pg (ref 26.0–34.0)
MCHC: 31.7 g/dL (ref 30.0–36.0)
MCV: 94 fL (ref 78.0–100.0)
PLATELETS: 152 10*3/uL (ref 150–400)
RBC: 4.3 MIL/uL (ref 4.22–5.81)
RDW: 15 % (ref 11.5–15.5)
WBC: 4.9 10*3/uL (ref 4.0–10.5)

## 2018-05-21 LAB — BRAIN NATRIURETIC PEPTIDE: B Natriuretic Peptide: 824 pg/mL — ABNORMAL HIGH (ref 0.0–100.0)

## 2018-05-21 LAB — I-STAT TROPONIN, ED: TROPONIN I, POC: 0.01 ng/mL (ref 0.00–0.08)

## 2018-05-21 MED ORDER — FUROSEMIDE 10 MG/ML IJ SOLN
40.0000 mg | Freq: Once | INTRAMUSCULAR | Status: AC
  Administered 2018-05-21: 40 mg via INTRAVENOUS
  Filled 2018-05-21: qty 4

## 2018-05-21 NOTE — ED Notes (Signed)
Pt ambulated from Room B16 around to security office hall back to B16. Pt o2 remained 100%-96%. Dr Darl Householder made aware.

## 2018-05-21 NOTE — ED Triage Notes (Signed)
Pt presents with shob from home, onset yesterday worse in last few hours, productive cough with clear sputum. Hx of CHF

## 2018-05-21 NOTE — Discharge Instructions (Addendum)
Increase your torsemide to 40 mg twice daily for 4 days then back down to your usual dose.   Call your heart failure doctor and follow up with the clinic next week.   Return to ER if you have worse shortness of breath, leg swelling, chest pain.

## 2018-05-21 NOTE — ED Provider Notes (Signed)
[facility] EMERGENCY DEPARTMENT Provider Note   CSN: [id number] Arrival date & time: 05/21/18  0445     History   Chief Complaint Chief Complaint  Patient presents with  . Shortness of Breath    HPI Patrick Stewart is a 62 y.o. male.  The history is provided by the patient.  He has a history of hypertension, diabetes, hyperlipidemia, ischemic cardiomyopathy with chronic systolic heart failure, chronic kidney disease who comes in with worsening difficulty breathing over the last 24 hours.  He noted that he was unable to lay flat because of difficulty breathing.  He denies chest pain, heaviness, tightness, pressure.  He denies any cough.  He states that he has not gained any weight.  He states he has been compliant with his medications, but he does admit to eating some barbecue prior to onset of his symptoms.  Past Medical History:  Diagnosis Date  . Angina at rest [facility]) 07/08/2016  . CAD (coronary artery disease) 2010   3v CABG  . Chest pain at rest 07/08/2016  . CHF (congestive heart failure) ([employer]) 01/26/2012  . CKD (chronic kidney disease) 02/19/2012  . COPD with chronic bronchitis ([place]) 08/26/2016  . Diabetes mellitus type 2, uncontrolled ([place]) 01/26/2012  . Elevated liver function tests 01/26/2012  . Gout 01/26/2012  . Hx of CABG 2010   RCA Stent 2008, CABG x 2 [facility] 2010  . Hypercholesterolemia 07/08/2016  . Hypertension 09/04/2014  . Ischemic cardiomyopathy 11/15/2014  . Kidney stone 10/25/2015  . Lipid disorder 01/26/2012  . NSTEMI (non-ST elevated myocardial infarction) ([place]) 07/08/2016  . ST elevation myocardial infarction (STEMI) ([place]) 2010  . Unstable angina ([place]) 11/14/2014    Patient Active Problem List   Diagnosis Date Noted  . Noncompliance by refusing intervention or support 04/06/2018  . Uncontrolled type 2 diabetes mellitus with hyperglycemia ([place]) 01/31/2018  . CHF (congestive heart failure) ([place]) 10/21/2017  . Chest pain 07/28/2017    . Elevated troponin 07/28/2017  . Diabetes mellitus with complication ([place])   . Acute respiratory failure with hypoxia ([place])   . Hypertensive urgency 07/12/2017  . LBBB (left bundle branch block) 04/26/2017  . AKI (acute kidney injury) ([place])   . ACC/AHA stage C chronic systolic heart failure ([place]) 04/11/2017  . Diabetes mellitus type 2, uncontrolled ([place]) 09/11/2016  . COPD with chronic bronchitis ([place]) 08/26/2016  . Non-ST elevation MI (NSTEMI) ([place]) 07/08/2016  . Kidney stone on right side 10/25/2015  . CKD (chronic kidney disease), stage III ([place]) 10/13/2015  . Gout 10/13/2015  . Abnormal EKG 10/13/2015  . CAD Status post CABG 2 SVG to OM 1, LIMA to mid LAD: 10/13/2015  . Cardiomyopathy, ischemic, chronic systolic CHF 67/11/4579  . Essential hypertension 09/04/2014  . Benign prostatic hyperplasia with lower urinary tract symptoms 01/24/2013  . Peyronie's disease 12/18/2012  . Chronic joint pain 06/22/2012  . Acute on chronic systolic CHF (congestive heart failure) ([place]) 01/26/2012  . History of tobacco abuse 01/26/2012  . Inguinal hernia, right 01/26/2012  . Lipid disorder 01/26/2012    Past Surgical History:  Procedure Laterality Date  . BIV ICD INSERTION CRT-D N/A 10/21/2017   Procedure: BIV ICD INSERTION CRT-D;  Surgeon: Constance Haw, MD;  Location: [facility] CV LAB;  Service: Cardiovascular;  Laterality: N/A;  . CORONARY ARTERY BYPASS GRAFT  2010  . KNEE SURGERY    . RIGHT/LEFT HEART CATH AND CORONARY ANGIOGRAPHY N/A 04/15/2017   Procedure: Right/Left Heart Cath and Coronary Angiography;  Surgeon:  Larey Dresser, MD;  Location: [facility] [place] [facility];  Service: Cardiovascular;  Laterality: N/A;  . ROTATOR CUFF REPAIR    . WRIST SURGERY          Home Medications    Prior to Admission medications   Medication Sig Start Date End Date Taking? Authorizing Provider  albuterol (PROVENTIL HFA;VENTOLIN HFA) 108 (90 Base) MCG/ACT inhaler Inhale 2 puffs into the  lungs every 6 (six) hours as needed for wheezing or shortness of breath.    [provider]  albuterol (PROVENTIL) (2.5 MG/3ML) 0.083% nebulizer solution INHALE CONTENTS OF 1 VIAL VIA NEBULIZER EVERY 6 HOURS AS NEEDED FOR WHEEZING/SHORTNESS OF BREATH 05/09/18   Wendling, Crosby Oyster, DO  allopurinol (ZYLOPRIM) 100 MG tablet Take 1 tablet (100 mg total) by mouth daily. Take with 300 mg tab for total of 400 mg daily. 03/09/18   Shelda Pal, DO  allopurinol (ZYLOPRIM) 300 MG tablet Take 300 mg by mouth 2 (two) times daily.    [provider]  aspirin EC 81 MG EC tablet Take 1 tablet (81 mg total) by mouth daily. 04/16/17   Clegg, Amy D, NP  atorvastatin (LIPITOR) 80 MG tablet Take 1 tablet (80 mg total) by mouth daily. 08/26/16   Shelda Pal, DO  carvedilol (COREG) 6.25 MG tablet Take 1.5 tablets (9.375 mg total) by mouth 2 (two) times daily. 12/15/17   Clegg, Amy D, NP  COLCRYS 0.6 MG tablet TAKE 1 TABLET BY MOUTH TWICE A DAY 01/28/18   Wendling, Crosby Oyster, DO  digoxin (LANOXIN) 0.125 MG tablet Take 1 tablet (0.125 mg total) by mouth daily. 10/27/17   Clegg, Amy D, NP  diphenhydrAMINE (BENADRYL) 25 MG tablet Take 25 mg by mouth daily as needed for itching.    [provider]  empagliflozin (JARDIANCE) 10 MG TABS tablet Take 10 mg by mouth daily. 02/01/18   Shelda Pal, DO  glucose blood test strip Use as instructed 08/26/16   Shelda Pal, DO  Investigational - Study Medication Take 1 tablet by mouth 2 (two) times daily. Study name: Galactic HF Study Additional study details: Omecamtiv Mecarbil or Placebo 04/15/17   Larey Dresser, MD  isosorbide-hydrALAZINE (BIDIL) 20-37.5 MG tablet Take 0.5 tablets by mouth 3 (three) times daily.    [provider]  Lancets (FREESTYLE) lancets Use to check sugars daily 08/26/16   Shelda Pal, DO  metFORMIN (GLUCOPHAGE) 500 MG tablet Take 1 tablet (500 mg total) by mouth 2  (two) times daily with a meal. 01/03/18   Wendling, Crosby Oyster, DO  orphenadrine (NORFLEX) 100 MG tablet Take 1 tablet (100 mg total) by mouth 2 (two) times daily. 02/19/18   Charlesetta Shanks, MD  oxyCODONE-acetaminophen (PERCOCET) 5-325 MG tablet Take 1-2 tablets by mouth every 4 (four) hours as needed. 02/19/18   Charlesetta Shanks, MD  pantoprazole (PROTONIX) 40 MG tablet Take 1 tablet (40 mg total) by mouth daily. 03/11/18   Larey Dresser, MD  sacubitril-valsartan (ENTRESTO) 49-51 MG Take 1 tablet by mouth 2 (two) times daily. 02/03/18   Camnitz, Ocie Doyne, MD  spironolactone (ALDACTONE) 25 MG tablet Take 25 mg by mouth daily.    [provider]  torsemide (DEMADEX) 20 MG tablet Take 20 mg 2 (two) times daily by mouth.    [provider]    Family History Family History  Problem Relation Age of Onset  . Hypertension Mother   . Diabetes Mother   . Hyperlipidemia Mother   .  Kidney failure Mother   . Emphysema Father     Social History Social History   Tobacco Use  . Smoking status: Former Smoker    Packs/day: 0.50    Years: 30.00    Pack years: 15.00    Last attempt to quit: 07/10/2016    Years since quitting: 1.8  . Smokeless tobacco: Never Used  Substance Use Topics  . Alcohol use: No  . Drug use: No     Allergies   Hydrocodone and Tramadol   Review of Systems Review of Systems  All other systems reviewed and are negative.    Physical Exam Updated Vital Signs BP (!) 179/108 (BP Location: Right Arm)   Pulse 86   Temp 97.9 F (36.6 C) (Oral)   Resp (!) 26   Ht 5\' 10"  (1.778 m)   Wt 88.5 kg (195 lb)   SpO2 97%   BMI 27.98 kg/m   Physical Exam  Nursing note and vitals reviewed.  62 year old male, resting comfortably and in no acute distress. Vital signs are significant for elevated blood pressure and elevated respiratory rate. Oxygen saturation is 97%, which is normal. Head is normocephalic and atraumatic. PERRLA, EOMI. Oropharynx is  clear. Neck is nontender and supple without adenopathy. JVD is present at 90 degrees. Back is nontender and there is no CVA tenderness.  There is 1+ presacral edema. Lungs have bibasilar rales which are more prominent on the left.  There are no wheezes or rhonchi. Chest is nontender. Heart has regular rate and rhythm without murmur. Abdomen is soft, flat, nontender without masses or hepatosplenomegaly and peristalsis is normoactive. Extremities have 2+ pretibial and pedal edema, full range of motion is present. Skin is warm and dry without rash. Neurologic: Mental status is normal, cranial nerves are intact, there are no motor or sensory deficits.  ED Treatments / Results  Labs (all labs ordered are listed, but only abnormal results are displayed) Labs Reviewed  BASIC METABOLIC PANEL - Abnormal; Notable for the following components:      Result Value   Glucose, Bld 182 (*)    BUN 24 (*)    Creatinine, Ser 1.81 (*)    GFR calc non Af Amer 38 (*)    GFR calc Af Amer 45 (*)    All other components within normal limits  CBC - Abnormal; Notable for the following components:   Hemoglobin 12.8 (*)    All other components within normal limits  BRAIN NATRIURETIC PEPTIDE - Abnormal; Notable for the following components:   B Natriuretic Peptide 824.0 (*)    All other components within normal limits  DIFFERENTIAL  I-STAT TROPONIN, ED    EKG EKG Interpretation  Date/Time:  Saturday May 21 2018 04:50:16 EDT Ventricular Rate:  91 PR Interval:  172 QRS Duration: 168 QT Interval:  424 QTC Calculation: 521 R Axis:   -72 Text Interpretation:  Atrial-sensed ventricular-paced rhythm Abnormal ECG When compared with ECG of 10/22/2017, No significant change was found Confirmed by Delora Fuel ([id number]) on 05/21/2018 4:55:58 AM   Radiology Dg Chest 2 View  Result Date: 05/21/2018 CLINICAL DATA:  Shortness of breath, onset yesterday but worse over the last few hours. Productive cough. History of  CHF. EXAM: CHEST - 2 VIEW COMPARISON:  10/22/2017 FINDINGS: Postoperative changes in the mediastinum. Cardiac pacemaker. Cardiac enlargement. Mild central vascular calcification. No edema or consolidation. No blunting of costophrenic angles. No pneumothorax. Calcification of the aorta. Postoperative change in the right shoulder. IMPRESSION: Cardiac  enlargement with mild vascular congestion. No edema or consolidation. Electronically Signed   By: Lucienne Capers M.D.   On: 05/21/2018 06:33    Procedures Procedures   Medications Ordered in ED Medications  furosemide (LASIX) injection 40 mg (40 mg Intravenous Given 05/21/18 0545)     Initial Impression / Assessment and Plan / ED Course  I have reviewed the triage vital signs and the nursing notes.  Pertinent labs & imaging results that were available during my care of the patient were reviewed by me and considered in my medical decision making (see chart for details).  CHF exacerbation which is probably secondary to excess sodium consumption.  Will check chest x-ray and BNP as well as basic metabolic panel.  He is given dose of intravenous furosemide.  Old records are reviewed, confirming outpatient management of ischemic cardiomyopathy with ejection fraction of 15%, prior ED visits and hospital admissions for CHF.  He had reasonably good diuresis with above-noted treatment.  He continues to maintain adequate oxygen saturation at rest.  He will be ambulated in the ED, and if he is able to maintain adequate oxygen saturation, he should be able to go home on an increased dose of torsemide for the next 4 days, and close follow-up in the heart failure clinic.  Chest x-ray does show cardiomegaly with slight pulmonary vascular congestion but no overt pulmonary edema.  BNP is moderately elevated.  Renal function is approximately at baseline.  Final Clinical Impressions(s) / ED Diagnoses   Final diagnoses:  Acute on chronic systolic heart failure [facility])     ED Discharge Orders    None       Delora Fuel, MD 24/58/09 [phone]

## 2018-05-21 NOTE — ED Provider Notes (Signed)
  Physical Exam  BP (!) 189/104 (BP Location: Left Arm)   Pulse 89   Temp 97.9 F (36.6 C) (Oral)   Resp 18   Ht 5\' 10"  (1.778 m)   Wt 88.5 kg (195 lb)   SpO2 100%   BMI 27.98 kg/m   Physical Exam  ED Course/Procedures     Procedures  MDM  Patient care assumed at 8 am from Dr. Roxanne Mins. Patient has hx of CHF and had some SOB. BNP 800, slightly increased from baseline. Given lasix. Sign out pending ambulation and reassessment.   8:52 AM Ambulated in the ED with O2 around 95%. Felt better now. Will increase torsemide to 40 mg BID for 4 days. Will have him follow back up with heart failure clinic.       Drenda Freeze, MD 05/21/18 [phone]

## 2018-07-15 ENCOUNTER — Emergency Department (HOSPITAL_BASED_OUTPATIENT_CLINIC_OR_DEPARTMENT_OTHER): Payer: Medicare Other

## 2018-07-15 ENCOUNTER — Other Ambulatory Visit: Payer: Self-pay

## 2018-07-15 ENCOUNTER — Inpatient Hospital Stay (HOSPITAL_BASED_OUTPATIENT_CLINIC_OR_DEPARTMENT_OTHER)
Admission: EM | Admit: 2018-07-15 | Discharge: 2018-07-17 | DRG: 291 | Disposition: A | Discharge status: Home / Self Care | Payer: Medicare Other | Attending: Family Medicine | Admitting: Family Medicine

## 2018-07-15 ENCOUNTER — Encounter (HOSPITAL_BASED_OUTPATIENT_CLINIC_OR_DEPARTMENT_OTHER): Payer: Self-pay

## 2018-07-15 DIAGNOSIS — Z833 Family history of diabetes mellitus: Secondary | ICD-10-CM

## 2018-07-15 DIAGNOSIS — E1122 Type 2 diabetes mellitus with diabetic chronic kidney disease: Secondary | ICD-10-CM | POA: Diagnosis present

## 2018-07-15 DIAGNOSIS — Z955 Presence of coronary angioplasty implant and graft: Secondary | ICD-10-CM

## 2018-07-15 DIAGNOSIS — Z951 Presence of aortocoronary bypass graft: Secondary | ICD-10-CM

## 2018-07-15 DIAGNOSIS — R0602 Shortness of breath: Secondary | ICD-10-CM | POA: Diagnosis not present

## 2018-07-15 DIAGNOSIS — J449 Chronic obstructive pulmonary disease, unspecified: Secondary | ICD-10-CM | POA: Diagnosis present

## 2018-07-15 DIAGNOSIS — E1165 Type 2 diabetes mellitus with hyperglycemia: Secondary | ICD-10-CM | POA: Diagnosis present

## 2018-07-15 DIAGNOSIS — Z841 Family history of disorders of kidney and ureter: Secondary | ICD-10-CM

## 2018-07-15 DIAGNOSIS — N183 Chronic kidney disease, stage 3 unspecified: Secondary | ICD-10-CM | POA: Diagnosis present

## 2018-07-15 DIAGNOSIS — I130 Hypertensive heart and chronic kidney disease with heart failure and stage 1 through stage 4 chronic kidney disease, or unspecified chronic kidney disease: Secondary | ICD-10-CM | POA: Diagnosis not present

## 2018-07-15 DIAGNOSIS — I110 Hypertensive heart disease with heart failure: Secondary | ICD-10-CM | POA: Diagnosis not present

## 2018-07-15 DIAGNOSIS — I5043 Acute on chronic combined systolic (congestive) and diastolic (congestive) heart failure: Secondary | ICD-10-CM | POA: Diagnosis present

## 2018-07-15 DIAGNOSIS — Z8349 Family history of other endocrine, nutritional and metabolic diseases: Secondary | ICD-10-CM

## 2018-07-15 DIAGNOSIS — Z95810 Presence of automatic (implantable) cardiac defibrillator: Secondary | ICD-10-CM

## 2018-07-15 DIAGNOSIS — Z7984 Long term (current) use of oral hypoglycemic drugs: Secondary | ICD-10-CM | POA: Diagnosis not present

## 2018-07-15 DIAGNOSIS — I252 Old myocardial infarction: Secondary | ICD-10-CM

## 2018-07-15 DIAGNOSIS — I10 Essential (primary) hypertension: Secondary | ICD-10-CM | POA: Diagnosis present

## 2018-07-15 DIAGNOSIS — I255 Ischemic cardiomyopathy: Secondary | ICD-10-CM | POA: Diagnosis present

## 2018-07-15 DIAGNOSIS — Z8249 Family history of ischemic heart disease and other diseases of the circulatory system: Secondary | ICD-10-CM

## 2018-07-15 DIAGNOSIS — M109 Gout, unspecified: Secondary | ICD-10-CM | POA: Diagnosis present

## 2018-07-15 DIAGNOSIS — R778 Other specified abnormalities of plasma proteins: Secondary | ICD-10-CM | POA: Diagnosis present

## 2018-07-15 DIAGNOSIS — Z825 Family history of asthma and other chronic lower respiratory diseases: Secondary | ICD-10-CM

## 2018-07-15 DIAGNOSIS — R7989 Other specified abnormal findings of blood chemistry: Secondary | ICD-10-CM | POA: Diagnosis present

## 2018-07-15 DIAGNOSIS — I160 Hypertensive urgency: Secondary | ICD-10-CM | POA: Diagnosis present

## 2018-07-15 DIAGNOSIS — Z885 Allergy status to narcotic agent status: Secondary | ICD-10-CM

## 2018-07-15 DIAGNOSIS — Z006 Encounter for examination for normal comparison and control in clinical research program: Secondary | ICD-10-CM

## 2018-07-15 DIAGNOSIS — E7800 Pure hypercholesterolemia, unspecified: Secondary | ICD-10-CM | POA: Diagnosis present

## 2018-07-15 DIAGNOSIS — Z79899 Other long term (current) drug therapy: Secondary | ICD-10-CM

## 2018-07-15 DIAGNOSIS — Z886 Allergy status to analgesic agent status: Secondary | ICD-10-CM

## 2018-07-15 DIAGNOSIS — I2510 Atherosclerotic heart disease of native coronary artery without angina pectoris: Secondary | ICD-10-CM | POA: Diagnosis present

## 2018-07-15 DIAGNOSIS — J4489 Other specified chronic obstructive pulmonary disease: Secondary | ICD-10-CM | POA: Diagnosis present

## 2018-07-15 DIAGNOSIS — Z87891 Personal history of nicotine dependence: Secondary | ICD-10-CM

## 2018-07-15 DIAGNOSIS — I5023 Acute on chronic systolic (congestive) heart failure: Secondary | ICD-10-CM

## 2018-07-15 DIAGNOSIS — Z7982 Long term (current) use of aspirin: Secondary | ICD-10-CM

## 2018-07-15 DIAGNOSIS — I5033 Acute on chronic diastolic (congestive) heart failure: Secondary | ICD-10-CM | POA: Diagnosis present

## 2018-07-15 LAB — CBC
HCT: 40.3 % (ref 39.0–52.0)
Hemoglobin: 13.3 g/dL (ref 13.0–17.0)
MCH: 30.9 pg (ref 26.0–34.0)
MCHC: 33 g/dL (ref 30.0–36.0)
MCV: 93.5 fL (ref 78.0–100.0)
PLATELETS: 185 10*3/uL (ref 150–400)
RBC: 4.31 MIL/uL (ref 4.22–5.81)
RDW: 15.3 % (ref 11.5–15.5)
WBC: 4.5 10*3/uL (ref 4.0–10.5)

## 2018-07-15 LAB — COMPREHENSIVE METABOLIC PANEL
ALBUMIN: 3.7 g/dL (ref 3.5–5.0)
ALT: 14 U/L (ref 0–44)
ANION GAP: 11 (ref 5–15)
AST: 25 U/L (ref 15–41)
Alkaline Phosphatase: 69 U/L (ref 38–126)
BUN: 37 mg/dL — ABNORMAL HIGH (ref 8–23)
CALCIUM: 8.8 mg/dL — AB (ref 8.9–10.3)
CHLORIDE: 98 mmol/L (ref 98–111)
CO2: 29 mmol/L (ref 22–32)
Creatinine, Ser: 2.1 mg/dL — ABNORMAL HIGH (ref 0.61–1.24)
GFR calc non Af Amer: 32 mL/min — ABNORMAL LOW (ref >60)
GFR, EST AFRICAN AMERICAN: 37 mL/min — AB (ref >60)
Glucose, Bld: 175 mg/dL — ABNORMAL HIGH (ref 70–99)
Potassium: 4 mmol/L (ref 3.5–5.1)
SODIUM: 138 mmol/L (ref 135–145)
Total Bilirubin: 0.9 mg/dL (ref 0.3–1.2)
Total Protein: 8.5 g/dL — ABNORMAL HIGH (ref 6.5–8.1)

## 2018-07-15 LAB — TROPONIN I: TROPONIN I: 0.2 ng/mL — AB (ref <0.03)

## 2018-07-15 LAB — BRAIN NATRIURETIC PEPTIDE: B Natriuretic Peptide: 681.1 pg/mL — ABNORMAL HIGH (ref 0.0–100.0)

## 2018-07-15 MED ORDER — NITROGLYCERIN 2 % TD OINT
1.0000 [in_us] | TOPICAL_OINTMENT | Freq: Once | TRANSDERMAL | Status: AC
  Administered 2018-07-15: 1 [in_us] via TOPICAL
  Filled 2018-07-15: qty 1

## 2018-07-15 MED ORDER — NITROGLYCERIN 0.4 MG SL SUBL: 0.4000 mg | SUBLINGUAL_TABLET | SUBLINGUAL | Status: DC | PRN

## 2018-07-15 MED ORDER — NITROGLYCERIN IN D5W 200-5 MCG/ML-% IV SOLN: 0.0000 ug/min | Freq: Once | INTRAVENOUS | Status: DC

## 2018-07-15 MED ORDER — ENALAPRILAT 1.25 MG/ML IV SOLN
1.2500 mg | Freq: Once | INTRAVENOUS | Status: AC
  Administered 2018-07-15: 1.25 mg via INTRAVENOUS
  Filled 2018-07-15: qty 2

## 2018-07-15 MED ORDER — FUROSEMIDE 10 MG/ML IJ SOLN
40.0000 mg | Freq: Once | INTRAMUSCULAR | Status: AC
  Administered 2018-07-15: 40 mg via INTRAVENOUS
  Filled 2018-07-15: qty 4

## 2018-07-15 NOTE — ED Notes (Signed)
Patient transported to X-ray 

## 2018-07-15 NOTE — ED Notes (Signed)
EDP into room, at BS.  ?

## 2018-07-15 NOTE — ED Notes (Signed)
Pt currently denies chest pain, c/o increased SOB for the last 2 days despite compliance with medication.  Pt has mild BLE edema, labored breathing and diminished breath sounds.

## 2018-07-15 NOTE — ED Triage Notes (Signed)
C/o SOB x 2 days-labored breathing-taken to tx area via w/c-RT at BS-states rales heard

## 2018-07-15 NOTE — ED Notes (Signed)
Pt has not urinated at this time and states he still cannot sit back on the bed

## 2018-07-15 NOTE — ED Notes (Signed)
Pt pulled up in bed and adjusted head of bed to sit upright more

## 2018-07-15 NOTE — ED Provider Notes (Addendum)
[facility] EMERGENCY DEPARTMENT Provider Note   CSN: [id number] Arrival date & time: 07/15/18  2157     History   Chief Complaint Chief Complaint  Patient presents with  . Shortness of Breath    HPI Patrick Stewart is a 62 y.o. male.  62 yo M with a chief complaint of worsening shortness of breath.  For the past 3 or 4 days the patient has been unable to sleep is been unable to lay back flat.  He is been having worsening lower extremity edema.  He has a mild cough that seems to come from time to time.  Denies fever denies hemoptysis denies chest pain.  He takes torsemide, denies any noncompliance.  Denies any change in his diet.  He tried his home albuterol without improvement.  The history is provided by the patient.  Shortness of Breath  This is a new problem. The average episode lasts 4 days. The problem occurs continuously.The current episode started less than 1 hour ago. The problem has been rapidly worsening. Associated symptoms include cough. Pertinent negatives include no fever, no headaches, no chest pain, no vomiting, no abdominal pain and no rash. He has tried beta-agonist inhalers for the symptoms. The treatment provided no relief. He has had prior hospitalizations. He has had prior ED visits. Associated medical issues include chronic lung disease and heart failure.    Past Medical History:  Diagnosis Date  . Angina at rest [facility]) 07/08/2016  . CAD (coronary artery disease) 2010   3v CABG  . Chest pain at rest 07/08/2016  . CHF (congestive heart failure) ([place]) 01/26/2012  . CKD (chronic kidney disease) 02/19/2012  . COPD with chronic bronchitis ([place]) 08/26/2016  . Diabetes mellitus type 2, uncontrolled ([place]) 01/26/2012  . Elevated liver function tests 01/26/2012  . Gout 01/26/2012  . Hx of CABG 2010   RCA Stent 2008, CABG x 2 [facility] 2010  . Hypercholesterolemia 07/08/2016  . Hypertension 09/04/2014  . Ischemic cardiomyopathy 11/15/2014  . Kidney stone  10/25/2015  . Lipid disorder 01/26/2012  . NSTEMI (non-ST elevated myocardial infarction) ([place]) 07/08/2016  . ST elevation myocardial infarction (STEMI) ([place]) 2010  . Unstable angina ([place]) 11/14/2014    Patient Active Problem List   Diagnosis Date Noted  . Noncompliance by refusing intervention or support 04/06/2018  . Uncontrolled type 2 diabetes mellitus with hyperglycemia ([place]) 01/31/2018  . CHF (congestive heart failure) ([place]) 10/21/2017  . Chest pain 07/28/2017  . Elevated troponin 07/28/2017  . Diabetes mellitus with complication ([place])   . Acute respiratory failure with hypoxia ([place])   . Hypertensive urgency 07/12/2017  . LBBB (left bundle branch block) 04/26/2017  . AKI (acute kidney injury) ([place])   . ACC/AHA stage C chronic systolic heart failure ([place]) 04/11/2017  . Diabetes mellitus type 2, uncontrolled ([place]) 09/11/2016  . COPD with chronic bronchitis ([place]) 08/26/2016  . Non-ST elevation MI (NSTEMI) ([place]) 07/08/2016  . Kidney stone on right side 10/25/2015  . CKD (chronic kidney disease), stage III ([place]) 10/13/2015  . Gout 10/13/2015  . Abnormal EKG 10/13/2015  . CAD Status post CABG 2 SVG to OM 1, LIMA to mid LAD: 10/13/2015  . Cardiomyopathy, ischemic, chronic systolic CHF 02/40/9735  . Essential hypertension 09/04/2014  . Benign prostatic hyperplasia with lower urinary tract symptoms 01/24/2013  . Peyronie's disease 12/18/2012  . Chronic joint pain 06/22/2012  . Acute on chronic systolic CHF (congestive heart failure) ([place]) 01/26/2012  . History of tobacco abuse 01/26/2012  . Inguinal  hernia, right 01/26/2012  . Lipid disorder 01/26/2012    Past Surgical History:  Procedure Laterality Date  . BIV ICD INSERTION CRT-D N/A 10/21/2017   Procedure: BIV ICD INSERTION CRT-D;  Surgeon: Constance Haw, MD;  Location: [facility] CV LAB;  Service: Cardiovascular;  Laterality: N/A;  . CORONARY ARTERY BYPASS GRAFT  2010  . KNEE SURGERY    . RIGHT/LEFT HEART CATH  AND CORONARY ANGIOGRAPHY N/A 04/15/2017   Procedure: Right/Left Heart Cath and Coronary Angiography;  Surgeon: Larey Dresser, MD;  Location: [facility];  Service: Cardiovascular;  Laterality: N/A;  . ROTATOR CUFF REPAIR    . WRIST SURGERY          Home Medications    Prior to Admission medications   Medication Sig Start Date End Date Taking? Authorizing Provider  albuterol (PROVENTIL HFA;VENTOLIN HFA) 108 (90 Base) MCG/ACT inhaler Inhale 2 puffs into the lungs every 6 (six) hours as needed for wheezing or shortness of breath.    [provider]  albuterol (PROVENTIL) (2.5 MG/3ML) 0.083% nebulizer solution INHALE CONTENTS OF 1 VIAL VIA NEBULIZER EVERY 6 HOURS AS NEEDED FOR WHEEZING/SHORTNESS OF BREATH 05/09/18   Wendling, Crosby Oyster, DO  allopurinol (ZYLOPRIM) 100 MG tablet Take 1 tablet (100 mg total) by mouth daily. Take with 300 mg tab for total of 400 mg daily. 03/09/18   Shelda Pal, DO  allopurinol (ZYLOPRIM) 300 MG tablet Take 300 mg by mouth 2 (two) times daily.    [provider]  aspirin EC 81 MG EC tablet Take 1 tablet (81 mg total) by mouth daily. 04/16/17   Clegg, Amy D, NP  atorvastatin (LIPITOR) 80 MG tablet Take 1 tablet (80 mg total) by mouth daily. 08/26/16   Shelda Pal, DO  carvedilol (COREG) 6.25 MG tablet Take 1.5 tablets (9.375 mg total) by mouth 2 (two) times daily. 12/15/17   Clegg, Amy D, NP  COLCRYS 0.6 MG tablet TAKE 1 TABLET BY MOUTH TWICE A DAY 01/28/18   Wendling, Crosby Oyster, DO  digoxin (LANOXIN) 0.125 MG tablet Take 1 tablet (0.125 mg total) by mouth daily. 10/27/17   Clegg, Amy D, NP  diphenhydrAMINE (BENADRYL) 25 MG tablet Take 25 mg by mouth daily as needed for itching.    [provider]  empagliflozin (JARDIANCE) 10 MG TABS tablet Take 10 mg by mouth daily. 02/01/18   Shelda Pal, DO  glucose blood test strip Use as instructed 08/26/16   Shelda Pal, DO  Investigational -  Study Medication Take 1 tablet by mouth 2 (two) times daily. Study name: Galactic HF Study Additional study details: Omecamtiv Mecarbil or Placebo 04/15/17   Larey Dresser, MD  isosorbide-hydrALAZINE (BIDIL) 20-37.5 MG tablet Take 0.5 tablets by mouth 3 (three) times daily.    [provider]  Lancets (FREESTYLE) lancets Use to check sugars daily 08/26/16   Shelda Pal, DO  metFORMIN (GLUCOPHAGE) 500 MG tablet Take 1 tablet (500 mg total) by mouth 2 (two) times daily with a meal. 01/03/18   Wendling, Crosby Oyster, DO  orphenadrine (NORFLEX) 100 MG tablet Take 1 tablet (100 mg total) by mouth 2 (two) times daily. 02/19/18   Charlesetta Shanks, MD  oxyCODONE-acetaminophen (PERCOCET) 5-325 MG tablet Take 1-2 tablets by mouth every 4 (four) hours as needed. 02/19/18   Charlesetta Shanks, MD  pantoprazole (PROTONIX) 40 MG tablet Take 1 tablet (40 mg total) by mouth daily. 03/11/18   Larey Dresser, MD  sacubitril-valsartan (ENTRESTO) 49-51 MG Take 1 tablet by mouth 2 (two) times daily. 02/03/18   Camnitz, Ocie Doyne, MD  spironolactone (ALDACTONE) 25 MG tablet Take 25 mg by mouth daily.    [provider]  torsemide (DEMADEX) 20 MG tablet Take 20 mg 2 (two) times daily by mouth.    [provider]    Family History Family History  Problem Relation Age of Onset  . Hypertension Mother   . Diabetes Mother   . Hyperlipidemia Mother   . Kidney failure Mother   . Emphysema Father     Social History Social History   Tobacco Use  . Smoking status: Former Smoker    Packs/day: 0.50    Years: 30.00    Pack years: 15.00    Last attempt to quit: 07/10/2016    Years since quitting: 2.0  . Smokeless tobacco: Never Used  Substance Use Topics  . Alcohol use: No  . Drug use: No     Allergies   Hydrocodone and Tramadol   Review of Systems Review of Systems  Constitutional: Negative for chills and fever.  HENT: Negative for congestion and facial swelling.   Eyes:  Negative for discharge and visual disturbance.  Respiratory: Positive for cough and shortness of breath.   Cardiovascular: Negative for chest pain and palpitations.  Gastrointestinal: Negative for abdominal pain, diarrhea and vomiting.  Musculoskeletal: Negative for arthralgias and myalgias.  Skin: Negative for color change and rash.  Neurological: Negative for tremors, syncope and headaches.  Psychiatric/Behavioral: Negative for confusion and dysphoric mood.     Physical Exam Updated Vital Signs BP (!) 169/100   Pulse 76   Temp 97.9 F (36.6 C) (Oral)   Resp (!) 30   Wt 88 kg (193 lb 14.4 oz)   SpO2 91%   BMI 27.82 kg/m   Physical Exam  Constitutional: He is oriented to person, place, and time. He appears well-developed and well-nourished.  HENT:  Head: Normocephalic and atraumatic.  Eyes: Pupils are equal, round, and reactive to light. EOM are normal.  Neck: Normal range of motion. Neck supple. JVD ( To the angle of the jaw) present.  Cardiovascular: Normal rate and regular rhythm. Exam reveals no gallop and no friction rub.  No murmur heard. Pulmonary/Chest: No respiratory distress. He has no wheezes. He has rales in the right middle field, the right lower field, the left middle field and the left lower field.  Abdominal: He exhibits no distension. There is no rebound and no guarding.  Musculoskeletal: Normal range of motion.       Right lower leg: He exhibits edema (3+ to the knees bilaterally).       Left lower leg: He exhibits edema.  Neurological: He is alert and oriented to person, place, and time.  Skin: No rash noted. No pallor.  Psychiatric: He has a normal mood and affect. His behavior is normal.  Nursing note and vitals reviewed.    ED Treatments / Results  Labs (all labs ordered are listed, but only abnormal results are displayed) Labs Reviewed  TROPONIN I - Abnormal; Notable for the following components:      Result Value   Troponin I 0.20 (*)    All  other components within normal limits  BRAIN NATRIURETIC PEPTIDE - Abnormal; Notable for the following components:   B Natriuretic Peptide 681.1 (*)    All other components within normal limits  COMPREHENSIVE METABOLIC PANEL - Abnormal; Notable for the following components:   Glucose,  Bld 175 (*)    BUN 37 (*)    Creatinine, Ser 2.10 (*)    Calcium 8.8 (*)    Total Protein 8.5 (*)    GFR calc non Af Amer 32 (*)    GFR calc Af Amer 37 (*)    All other components within normal limits  CBC    EKG EKG Interpretation  Date/Time:  Friday July 15 2018 22:05:52 EDT Ventricular Rate:  86 PR Interval:    QRS Duration: 173 QT Interval:  446 QTC Calculation: 534 R Axis:   -17 Text Interpretation:  Atrial-sensed ventricular-paced complexes No further analysis attempted due to paced rhythm Baseline wander in lead(s) V4 No significant change since last tracing Confirmed by Deno Etienne [phone]) on 07/15/2018 10:26:55 PM   Radiology Dg Chest 2 View  Result Date: 07/15/2018 CLINICAL DATA:  Shortness of breath for 2 days. History of CHF and bronchitis. EXAM: CHEST - 2 VIEW COMPARISON:  Chest radiograph May 21, 2018 FINDINGS: Stable cardiomegaly. Status post median sternotomy for CABG. Calcified aortic arch. Three lead LEFT cardiac defibrillator in situ. Mild pulmonary vascular congestion. No pleural effusion or focal consolidation. RIGHT lung base scarring. No pneumothorax. Suture anchor RIGHT humeral head. IMPRESSION: Stable cardiomegaly and mild pulmonary vascular congestion. Aortic Atherosclerosis (ICD10-I70.0). Electronically Signed   By: Elon Alas M.D.   On: 07/15/2018 22:50    Procedures Procedures (including critical care time)  Medications Ordered in ED Medications  nitroGLYCERIN (NITROSTAT) SL tablet 0.4 mg (has no administration in time range)  enalaprilat (VASOTEC) injection 1.25 mg (1.25 mg Intravenous Given 07/15/18 2230)  furosemide (LASIX) injection 40 mg (40 mg Intravenous  Given 07/15/18 2300)  nitroGLYCERIN (NITROGLYN) 2 % ointment 1 inch (1 inch Topical Given 07/15/18 2302)     Initial Impression / Assessment and Plan / ED Course  I have reviewed the triage vital signs and the nursing notes.  Pertinent labs & imaging results that were available during my care of the patient were reviewed by me and considered in my medical decision making (see chart for details).  Clinical Course as of Jul 15 2357  Fri Jul 15, 2018  2314 Mild bump in his creatinine.  Patient reassessed and feeling better, BP down with enalapril, nitro, feels breathing has improved.  Will give lasix and ambulate   [DF]    Clinical Course User Index [DF] Deno Etienne, DO    62 yo M with a chief complaint of shortness of breath.  Clinically the patient is in a heart failure exacerbation.  He is got pitting edema to the bilateral lower extremities JVD to the angle of the jaw and rales up to the mid back.  He is hypertensive and tachypneic here will give nitroglycerin enalapril.   Patient with large urine output from his home torsemide. Given dose of lasix here.  Patient requesting trial of discharge home, will ambulate on O2 and see how his does.    CRITICAL CARE Performed by: Cecilio Asper   Total critical care time: 35 minutes  Critical care time was exclusive of separately billable procedures and treating other patients.  Critical care was necessary to treat or prevent imminent or life-threatening deterioration.  Critical care was time spent personally by me on the following activities: development of treatment plan with patient and/or surrogate as well as nursing, discussions with consultants, evaluation of patient's response to treatment, examination of patient, obtaining history from patient or surrogate, ordering and performing treatments and interventions, ordering and review  of laboratory studies, ordering and review of radiographic studies, pulse oximetry and re-evaluation of  patient's condition.   Discussed with Dr. Stark Jock, please see his note for further details of care.   The patients results and plan were reviewed and discussed.   Any x-rays performed were independently reviewed by myself.   Differential diagnosis were considered with the presenting HPI.  Medications  nitroGLYCERIN (NITROSTAT) SL tablet 0.4 mg (has no administration in time range)  enalaprilat (VASOTEC) injection 1.25 mg (1.25 mg Intravenous Given 07/15/18 2230)  furosemide (LASIX) injection 40 mg (40 mg Intravenous Given 07/15/18 2300)  nitroGLYCERIN (NITROGLYN) 2 % ointment 1 inch (1 inch Topical Given 07/15/18 2302)    Vitals:   07/15/18 2211 07/15/18 2230 07/15/18 2300 07/15/18 2330  BP:  (!) 153/97 (!) 170/102 (!) 169/100  Pulse:  68 73 76  Resp:  (!) 23 (!) 24 (!) 30  Temp:      TempSrc:      SpO2:  98% 93% 91%  Weight: 88 kg (193 lb 14.4 oz)       Final diagnoses:  Acute on chronic systolic congestive heart failure (HCC)      Final Clinical Impressions(s) / ED Diagnoses   Final diagnoses:  Acute on chronic systolic congestive heart failure [facility])    ED Discharge Orders    None       Deno Etienne, DO 07/15/18 [place], [place], DO 07/29/18 1647

## 2018-07-16 ENCOUNTER — Encounter (HOSPITAL_COMMUNITY): Payer: Self-pay | Admitting: Family Medicine

## 2018-07-16 ENCOUNTER — Inpatient Hospital Stay (HOSPITAL_COMMUNITY): Payer: Medicare Other

## 2018-07-16 DIAGNOSIS — I361 Nonrheumatic tricuspid (valve) insufficiency: Secondary | ICD-10-CM

## 2018-07-16 DIAGNOSIS — M109 Gout, unspecified: Secondary | ICD-10-CM | POA: Diagnosis present

## 2018-07-16 DIAGNOSIS — Z833 Family history of diabetes mellitus: Secondary | ICD-10-CM | POA: Diagnosis not present

## 2018-07-16 DIAGNOSIS — R0602 Shortness of breath: Secondary | ICD-10-CM | POA: Diagnosis not present

## 2018-07-16 DIAGNOSIS — I130 Hypertensive heart and chronic kidney disease with heart failure and stage 1 through stage 4 chronic kidney disease, or unspecified chronic kidney disease: Secondary | ICD-10-CM | POA: Diagnosis present

## 2018-07-16 DIAGNOSIS — Z841 Family history of disorders of kidney and ureter: Secondary | ICD-10-CM | POA: Diagnosis not present

## 2018-07-16 DIAGNOSIS — I5033 Acute on chronic diastolic (congestive) heart failure: Secondary | ICD-10-CM | POA: Diagnosis present

## 2018-07-16 DIAGNOSIS — Z79899 Other long term (current) drug therapy: Secondary | ICD-10-CM | POA: Diagnosis not present

## 2018-07-16 DIAGNOSIS — I2510 Atherosclerotic heart disease of native coronary artery without angina pectoris: Secondary | ICD-10-CM | POA: Diagnosis present

## 2018-07-16 DIAGNOSIS — Z87891 Personal history of nicotine dependence: Secondary | ICD-10-CM | POA: Diagnosis not present

## 2018-07-16 DIAGNOSIS — Z8349 Family history of other endocrine, nutritional and metabolic diseases: Secondary | ICD-10-CM | POA: Diagnosis not present

## 2018-07-16 DIAGNOSIS — I10 Essential (primary) hypertension: Secondary | ICD-10-CM | POA: Diagnosis not present

## 2018-07-16 DIAGNOSIS — E7800 Pure hypercholesterolemia, unspecified: Secondary | ICD-10-CM | POA: Diagnosis present

## 2018-07-16 DIAGNOSIS — E1165 Type 2 diabetes mellitus with hyperglycemia: Secondary | ICD-10-CM | POA: Diagnosis not present

## 2018-07-16 DIAGNOSIS — R748 Abnormal levels of other serum enzymes: Secondary | ICD-10-CM | POA: Diagnosis not present

## 2018-07-16 DIAGNOSIS — Z006 Encounter for examination for normal comparison and control in clinical research program: Secondary | ICD-10-CM | POA: Diagnosis not present

## 2018-07-16 DIAGNOSIS — N183 Chronic kidney disease, stage 3 (moderate): Secondary | ICD-10-CM | POA: Diagnosis present

## 2018-07-16 DIAGNOSIS — Z95810 Presence of automatic (implantable) cardiac defibrillator: Secondary | ICD-10-CM | POA: Diagnosis not present

## 2018-07-16 DIAGNOSIS — Z7984 Long term (current) use of oral hypoglycemic drugs: Secondary | ICD-10-CM | POA: Diagnosis not present

## 2018-07-16 DIAGNOSIS — E1122 Type 2 diabetes mellitus with diabetic chronic kidney disease: Secondary | ICD-10-CM | POA: Diagnosis present

## 2018-07-16 DIAGNOSIS — I252 Old myocardial infarction: Secondary | ICD-10-CM | POA: Diagnosis not present

## 2018-07-16 DIAGNOSIS — Z951 Presence of aortocoronary bypass graft: Secondary | ICD-10-CM | POA: Diagnosis not present

## 2018-07-16 DIAGNOSIS — I5043 Acute on chronic combined systolic (congestive) and diastolic (congestive) heart failure: Secondary | ICD-10-CM | POA: Diagnosis present

## 2018-07-16 DIAGNOSIS — Z7982 Long term (current) use of aspirin: Secondary | ICD-10-CM | POA: Diagnosis not present

## 2018-07-16 DIAGNOSIS — Z825 Family history of asthma and other chronic lower respiratory diseases: Secondary | ICD-10-CM | POA: Diagnosis not present

## 2018-07-16 DIAGNOSIS — I160 Hypertensive urgency: Secondary | ICD-10-CM | POA: Diagnosis present

## 2018-07-16 DIAGNOSIS — Z8249 Family history of ischemic heart disease and other diseases of the circulatory system: Secondary | ICD-10-CM | POA: Diagnosis not present

## 2018-07-16 DIAGNOSIS — J449 Chronic obstructive pulmonary disease, unspecified: Secondary | ICD-10-CM | POA: Diagnosis present

## 2018-07-16 DIAGNOSIS — Z955 Presence of coronary angioplasty implant and graft: Secondary | ICD-10-CM | POA: Diagnosis not present

## 2018-07-16 DIAGNOSIS — I509 Heart failure, unspecified: Secondary | ICD-10-CM | POA: Insufficient documentation

## 2018-07-16 DIAGNOSIS — I255 Ischemic cardiomyopathy: Secondary | ICD-10-CM | POA: Diagnosis present

## 2018-07-16 LAB — ECHOCARDIOGRAM COMPLETE
HEIGHTINCHES: 70 in
WEIGHTICAEL: 3048 [oz_av]

## 2018-07-16 LAB — TROPONIN I
Troponin I: 0.19 ng/mL (ref <0.03)
Troponin I: 0.19 ng/mL (ref <0.03)
Troponin I: 0.17 ng/mL (ref <0.03)

## 2018-07-16 LAB — GLUCOSE, CAPILLARY
GLUCOSE-CAPILLARY: 141 mg/dL — AB (ref 70–99)
GLUCOSE-CAPILLARY: 141 mg/dL — AB (ref 70–99)
GLUCOSE-CAPILLARY: 157 mg/dL — AB (ref 70–99)

## 2018-07-16 LAB — MRSA PCR SCREENING: MRSA by PCR: POSITIVE — AB

## 2018-07-16 LAB — HIV ANTIBODY (ROUTINE TESTING W REFLEX): HIV Screen 4th Generation wRfx: NONREACTIVE

## 2018-07-16 LAB — MAGNESIUM: Magnesium: 1.6 mg/dL — ABNORMAL LOW (ref 1.7–2.4)

## 2018-07-16 MED ORDER — DIPHENHYDRAMINE HCL 25 MG PO CAPS: 25.0000 mg | ORAL_CAPSULE | Freq: Every day | ORAL | Status: DC | PRN

## 2018-07-16 MED ORDER — ISOSORB DINITRATE-HYDRALAZINE 20-37.5 MG PO TABS
1.0000 | ORAL_TABLET | Freq: Three times a day (TID) | ORAL | Status: DC
  Administered 2018-07-16 – 2018-07-17 (×3): 1 via ORAL
  Filled 2018-07-16 (×3): qty 1

## 2018-07-16 MED ORDER — ISOSORB DINITRATE-HYDRALAZINE 20-37.5 MG PO TABS
0.5000 | ORAL_TABLET | Freq: Three times a day (TID) | ORAL | Status: DC
  Administered 2018-07-16: 0.5 via ORAL
  Filled 2018-07-16: qty 1

## 2018-07-16 MED ORDER — MUPIROCIN 2 % EX OINT
1.0000 "application " | TOPICAL_OINTMENT | Freq: Two times a day (BID) | CUTANEOUS | Status: DC
  Administered 2018-07-16 – 2018-07-17 (×3): 1 via NASAL
  Filled 2018-07-16: qty 22

## 2018-07-16 MED ORDER — SODIUM CHLORIDE 0.9% FLUSH
3.0000 mL | Freq: Two times a day (BID) | INTRAVENOUS | Status: DC
  Administered 2018-07-16 – 2018-07-17 (×3): 3 mL via INTRAVENOUS

## 2018-07-16 MED ORDER — ALLOPURINOL 300 MG PO TABS: 300.0000 mg | ORAL_TABLET | Freq: Two times a day (BID) | ORAL | Status: DC

## 2018-07-16 MED ORDER — PANTOPRAZOLE SODIUM 40 MG PO TBEC
40.0000 mg | DELAYED_RELEASE_TABLET | Freq: Every day | ORAL | Status: DC
  Administered 2018-07-16: 40 mg via ORAL
  Filled 2018-07-16: qty 1

## 2018-07-16 MED ORDER — SACUBITRIL-VALSARTAN 49-51 MG PO TABS
1.0000 | ORAL_TABLET | Freq: Two times a day (BID) | ORAL | Status: DC
  Administered 2018-07-17: 1 via ORAL
  Filled 2018-07-16: qty 1

## 2018-07-16 MED ORDER — FUROSEMIDE 10 MG/ML IJ SOLN
40.0000 mg | Freq: Two times a day (BID) | INTRAMUSCULAR | Status: DC
  Administered 2018-07-16: 40 mg via INTRAVENOUS
  Filled 2018-07-16: qty 4

## 2018-07-16 MED ORDER — INSULIN ASPART 100 UNIT/ML ~~LOC~~ SOLN: 0.0000 [IU] | Freq: Every day | SUBCUTANEOUS | Status: DC

## 2018-07-16 MED ORDER — SPIRONOLACTONE 25 MG PO TABS
25.0000 mg | ORAL_TABLET | Freq: Every day | ORAL | Status: DC
  Administered 2018-07-16 – 2018-07-17 (×2): 25 mg via ORAL
  Filled 2018-07-16 (×2): qty 1

## 2018-07-16 MED ORDER — ALLOPURINOL 100 MG PO TABS
400.0000 mg | ORAL_TABLET | Freq: Every day | ORAL | Status: DC
  Administered 2018-07-16 – 2018-07-17 (×2): 400 mg via ORAL
  Filled 2018-07-16 (×2): qty 4

## 2018-07-16 MED ORDER — INSULIN ASPART 100 UNIT/ML ~~LOC~~ SOLN
0.0000 [IU] | Freq: Three times a day (TID) | SUBCUTANEOUS | Status: DC
  Administered 2018-07-16 – 2018-07-17 (×4): 1 [IU] via SUBCUTANEOUS
  Administered 2018-07-17: 2 [IU] via SUBCUTANEOUS

## 2018-07-16 MED ORDER — ONDANSETRON HCL 4 MG/2ML IJ SOLN: 4.0000 mg | Freq: Four times a day (QID) | INTRAMUSCULAR | Status: DC | PRN

## 2018-07-16 MED ORDER — DIGOXIN 125 MCG PO TABS
0.1250 mg | ORAL_TABLET | Freq: Every day | ORAL | Status: DC
  Administered 2018-07-16 – 2018-07-17 (×2): 0.125 mg via ORAL
  Filled 2018-07-16 (×2): qty 1

## 2018-07-16 MED ORDER — SODIUM CHLORIDE 0.9% FLUSH: 3.0000 mL | INTRAVENOUS | Status: DC | PRN

## 2018-07-16 MED ORDER — ORPHENADRINE CITRATE ER 100 MG PO TB12: 100.0000 mg | ORAL_TABLET | Freq: Two times a day (BID) | ORAL | Status: DC

## 2018-07-16 MED ORDER — ATORVASTATIN CALCIUM 80 MG PO TABS
80.0000 mg | ORAL_TABLET | Freq: Every day | ORAL | Status: DC
  Administered 2018-07-16: 80 mg via ORAL
  Filled 2018-07-16: qty 1

## 2018-07-16 MED ORDER — CHLORHEXIDINE GLUCONATE CLOTH 2 % EX PADS
6.0000 | MEDICATED_PAD | Freq: Every day | CUTANEOUS | Status: DC
  Administered 2018-07-16 – 2018-07-17 (×2): 6 via TOPICAL

## 2018-07-16 MED ORDER — ASPIRIN EC 81 MG PO TBEC
81.0000 mg | DELAYED_RELEASE_TABLET | Freq: Every day | ORAL | Status: DC
  Administered 2018-07-16 – 2018-07-17 (×2): 81 mg via ORAL
  Filled 2018-07-16 (×2): qty 1

## 2018-07-16 MED ORDER — SODIUM CHLORIDE 0.9 % IV SOLN
250.0000 mL | INTRAVENOUS | Status: DC | PRN
  Administered 2018-07-17: 250 mL via INTRAVENOUS

## 2018-07-16 MED ORDER — STUDY - GALACTIC STUDY - PLACEBO / OMECAMTIV MECARBIL (PI-MCLEAN)
1.0000 | Freq: Two times a day (BID) | Status: DC
  Filled 2018-07-16 (×2): qty 1

## 2018-07-16 MED ORDER — CARVEDILOL 12.5 MG PO TABS
12.5000 mg | ORAL_TABLET | Freq: Two times a day (BID) | ORAL | Status: DC
  Administered 2018-07-16 – 2018-07-17 (×2): 12.5 mg via ORAL
  Filled 2018-07-16 (×2): qty 1

## 2018-07-16 MED ORDER — HEPARIN SODIUM (PORCINE) 5000 UNIT/ML IJ SOLN
5000.0000 [IU] | Freq: Three times a day (TID) | INTRAMUSCULAR | Status: DC
  Administered 2018-07-16 – 2018-07-17 (×4): 5000 [IU] via SUBCUTANEOUS
  Filled 2018-07-16 (×4): qty 1

## 2018-07-16 MED ORDER — ACETAMINOPHEN 325 MG PO TABS
650.0000 mg | ORAL_TABLET | ORAL | Status: DC | PRN
  Administered 2018-07-16 – 2018-07-17 (×2): 650 mg via ORAL
  Filled 2018-07-16 (×2): qty 2

## 2018-07-16 MED ORDER — FUROSEMIDE 10 MG/ML IJ SOLN
80.0000 mg | Freq: Two times a day (BID) | INTRAMUSCULAR | Status: DC
  Administered 2018-07-16: 80 mg via INTRAVENOUS
  Filled 2018-07-16: qty 8

## 2018-07-16 MED ORDER — ALBUTEROL SULFATE (2.5 MG/3ML) 0.083% IN NEBU: 2.5000 mg | INHALATION_SOLUTION | Freq: Four times a day (QID) | RESPIRATORY_TRACT | Status: DC | PRN

## 2018-07-16 MED ORDER — CARVEDILOL 6.25 MG PO TABS
9.3750 mg | ORAL_TABLET | Freq: Two times a day (BID) | ORAL | Status: DC
  Administered 2018-07-16: 9.375 mg via ORAL
  Filled 2018-07-16: qty 1

## 2018-07-16 MED ORDER — OXYCODONE-ACETAMINOPHEN 5-325 MG PO TABS
1.0000 | ORAL_TABLET | ORAL | Status: DC | PRN
Start: 2018-07-16 — End: 2018-07-17

## 2018-07-16 NOTE — Progress Notes (Signed)
Paged Jeannette [facility] troponin =0.19   ... Waiting for call back. Pt asymptomatic. No c/o c/p nor SOB

## 2018-07-16 NOTE — ED Notes (Signed)
[facility] called for transport [employer] account planner)

## 2018-07-16 NOTE — Progress Notes (Signed)
  Echocardiogram 2D Echocardiogram has been performed.  Patrick Stewart 07/16/2018, 2:11 PM

## 2018-07-16 NOTE — ED Notes (Signed)
Ambulated patient per physicians order. Patient pre saturations were 98% with HR of 84. Patient maintained steady gait with oxygen saturations of 88-96% and HR of 84-92. Patient returned to room and placed back on monitor.

## 2018-07-16 NOTE — Progress Notes (Signed)
PROGRESS NOTE    Patrick Stewart  YCX:[id number] DOB: 17-Dec-1956 DOA: 07/15/2018 PCP: Shelda Pal, DO      Brief Narrative:  Patrick Stewart is a 62 y.o. male with medical history significant for chronic kidney disease stage III, hypertension, type 2 diabetes mellitus, and chronic CHF with AICD, now presenting to the emergency department for evaluation of shortness of breath and orthopnea. Patient reports a binge usual state of health until just a few days ago when he developed orthopnea. He also began to develop shortness of breath with exertion that has progressively worsened to the point where he has difficulty ambulating more than a few steps. He denies any chest pain associated with this. Reports some mild lower extremity edema bilaterally. Denies any significant cough, and denies fevers or chills. Reports adherence with his medications and denies any dietary indiscretions.      Assessment & Plan:  Acute on chronic diastolic CHF  Echo from December '18 with preserved EF; EF had been 10-15% previously and he has AICD in place  -Continue Lasix, adjust to 80 BID -Obtain echo -Continue BB, spiro, digoxin -Titrate up Bidil -Hold Entresto until Sunday as ACE-i was given in ED  CAD; elevated troponin  No chest pain, torponin trending down.  From CHF. -Continue aspirin, statin  Hypertension with hypertensive urgency  BP 185/115 in ED, improved with Vasotec, IV Lasix, and NTG ointment -Continue Coreg -Titrate Bidil -Continue furosemide -Restart Entresto as above   CKD stage III  SCr is 2.10 on admission, up from 1.81 in May  -Daily Cr  Type II DM  A1c was 8.4% in February  -Hold metformin  -Continue Jardiance when medication reconciliation complete -SSI corrections  COPD  No active disease  Other medications -Continue allopurinol -Continue PPI     DVT prophylaxis: Heparin Code Status: FULL Family Communication: None present MDM and disposition Plan:  This is a no charge note.  For further details, please see H&P by my partner Dr. Myna Hidalgo from earlier today.  The below labs and imaging reports were reviewed and summarized above.    The patient was admitted with CHF flare.    Objective: Vitals:   07/16/18 0100 07/16/18 0130 07/16/18 0200 07/16/18 0358  BP:  (!) 166/90 (!) 164/109 (!) 161/105  Pulse: 72 (!) 57 68 63  Resp: 14 14 18 18   Temp:      TempSrc:    Oral  SpO2: 99% 98% 96% 98%  Weight:    86.4 kg (190 lb 8 oz)  Height:    5\' 10"  (1.778 m)    Intake/Output Summary (Last 24 hours) at 07/16/2018 0900 Last data filed at 07/16/2018 0600 Gross per 24 hour  Intake 240 ml  Output 750 ml  Net -510 ml   Filed Weights   07/15/18 2211 07/16/18 0358  Weight: 88 kg (193 lb 14.4 oz) 86.4 kg (190 lb 8 oz)    Examination: The patient was seen and examined.      Data Reviewed: I have personally reviewed following labs and imaging studies:  CBC: Recent Labs  Lab 07/15/18 2209  WBC 4.5  HGB 13.3  HCT 40.3  MCV 93.5  PLT 497   Basic Metabolic Panel: Recent Labs  Lab 07/15/18 2209 07/16/18 0444  NA 138  --   K 4.0  --   CL 98  --   CO2 29  --   GLUCOSE 175*  --   BUN 37*  --   CREATININE 2.10*  --  CALCIUM 8.8*  --   MG  --  1.6*   GFR: Estimated Creatinine Clearance: 37.7 mL/min (A) (by C-G formula based on SCr of 2.1 mg/dL (H)). Liver Function Tests: Recent Labs  Lab 07/15/18 2209  AST 25  ALT 14  ALKPHOS 69  BILITOT 0.9  PROT 8.5*  ALBUMIN 3.7   No results for input(s): LIPASE, AMYLASE in the last 168 hours. No results for input(s): AMMONIA in the last 168 hours. Coagulation Profile: No results for input(s): INR, PROTIME in the last 168 hours. Cardiac Enzymes: Recent Labs  Lab 07/15/18 2209 07/16/18 0444  TROPONINI 0.20* 0.19*   BNP (last 3 results) No results for input(s): PROBNP in the last 8760 hours. HbA1C: No results for input(s): HGBA1C in the last 72 hours. CBG: No results for  input(s): GLUCAP in the last 168 hours. Lipid Profile: No results for input(s): CHOL, HDL, LDLCALC, TRIG, CHOLHDL, LDLDIRECT in the last 72 hours. Thyroid Function Tests: No results for input(s): TSH, T4TOTAL, FREET4, T3FREE, THYROIDAB in the last 72 hours. Anemia Panel: No results for input(s): VITAMINB12, FOLATE, FERRITIN, TIBC, IRON, RETICCTPCT in the last 72 hours. Urine analysis:    Component Value Date/Time   COLORURINE YELLOW 02/19/2018 1301   APPEARANCEUR CLEAR 02/19/2018 1301   LABSPEC 1.025 02/19/2018 1301   PHURINE 6.0 02/19/2018 1301   GLUCOSEU >=500 (A) 02/19/2018 1301   HGBUR TRACE (A) 02/19/2018 1301   BILIRUBINUR NEGATIVE 02/19/2018 1301   KETONESUR NEGATIVE 02/19/2018 1301   PROTEINUR 100 (A) 02/19/2018 1301   UROBILINOGEN 1.0 10/12/2015 1830   NITRITE NEGATIVE 02/19/2018 1301   LEUKOCYTESUR NEGATIVE 02/19/2018 1301   Sepsis Labs: @LABRCNTIP (procalcitonin:4,lacticacidven:4)  ) Recent Results (from the past 240 hour(s))  MRSA PCR Screening     Status: Abnormal   Collection Time: 07/16/18  3:34 AM  Result Value Ref Range Status   MRSA by PCR POSITIVE (A) NEGATIVE Final    Comment:        The GeneXpert MRSA Assay (FDA approved for NASAL specimens only), is one component of a comprehensive MRSA colonization surveillance program. It is not intended to diagnose MRSA infection nor to guide or monitor treatment for MRSA infections. RESULT CALLED TO, READ BACK BY AND VERIFIED WITH: E DODO RN 07/16/18 0865 JDW          Radiology Studies: Dg Chest 2 View  Result Date: 07/15/2018 CLINICAL DATA:  Shortness of breath for 2 days. History of CHF and bronchitis. EXAM: CHEST - 2 VIEW COMPARISON:  Chest radiograph May 21, 2018 FINDINGS: Stable cardiomegaly. Status post median sternotomy for CABG. Calcified aortic arch. Three lead LEFT cardiac defibrillator in situ. Mild pulmonary vascular congestion. No pleural effusion or focal consolidation. RIGHT lung base  scarring. No pneumothorax. Suture anchor RIGHT humeral head. IMPRESSION: Stable cardiomegaly and mild pulmonary vascular congestion. Aortic Atherosclerosis (ICD10-I70.0). Electronically Signed   By: Elon Alas M.D.   On: 07/15/2018 22:50        Scheduled Meds: . allopurinol  400 mg Oral Daily  . aspirin EC  81 mg Oral Daily  . atorvastatin  80 mg Oral q1800  . carvedilol  9.375 mg Oral BID WC  . Chlorhexidine Gluconate Cloth  6 each Topical Q0600  . digoxin  0.125 mg Oral Daily  . furosemide  40 mg Intravenous BID  . heparin  5,000 Units Subcutaneous Q8H  . insulin aspart  0-5 Units Subcutaneous QHS  . insulin aspart  0-9 Units Subcutaneous TID WC  . isosorbide-hydrALAZINE  0.5 tablet Oral TID  . mupirocin ointment  1 application Nasal BID  . pantoprazole  40 mg Oral Daily  . [START ON 07/17/2018] sacubitril-valsartan  1 tablet Oral BID  . sodium chloride flush  3 mL Intravenous Q12H  . spironolactone  25 mg Oral Daily   Continuous Infusions: . sodium chloride       LOS: 0 days    Time spent: 25 minutes    Edwin Dada, MD [facility] 07/16/2018, 9:00 AM     Pager [phone] --- please page though AMION:  [email or web address] If 7PM-7AM, please contact night-coverage

## 2018-07-16 NOTE — Progress Notes (Signed)
Patient placed on CPAP of 10 cmH2O  via [facility] .  Patient is tolerating at this time.      07/16/18 2100  BiPAP/CPAP/SIPAP  BiPAP/CPAP/SIPAP Pt Type Adult  Mask Type Full face mask  Mask Size Medium  Respiratory Rate 18 breaths/min  EPAP 10 cmH2O  Oxygen Percent 21 %  BiPAP/CPAP/SIPAP CPAP  Patient Home Equipment No  Auto Titrate No  BiPAP/CPAP /SiPAP Vitals  Bilateral Breath Sounds Clear;Diminished

## 2018-07-16 NOTE — ED Notes (Signed)
[facility] notified [employer] account planner) - hospitalist consult @ [facility]

## 2018-07-16 NOTE — Plan of Care (Addendum)
[facility] Transfer discussed with Patrick Etienne, MD.  Patrick Stewart is a 62 year old male with pmh CAD s/p CABG, s/p AICD, dCHF, who presented with SOB.  Labs revealed BNP 681.1, troponin 0.2.  Patient was noted to be hypoxic with ambulation into the 80s.  Chest x-ray showing stable cardiomegaly with mild vascular congestion.  Patient treated with nitroglycerin, 1.25 mg of IV Vasotec, and 40 mg of Lasix IV.  [facility] called to admit for suspected CHF exacerbation.  Transfer to a telemetry bed.

## 2018-07-16 NOTE — H&P (Signed)
History and Physical    Patrick Stewart ENI:[id number] DOB: June 05, 1956 DOA: 07/15/2018  PCP: Shelda Pal, DO   Patient coming from: Home, by way of [facility]   Chief Complaint: SOB, orthopnea   HPI: Patrick Stewart is a 62 y.o. male with medical history significant for chronic kidney disease stage III, hypertension, type 2 diabetes mellitus, and chronic CHF with AICD, now presenting to the emergency department for evaluation of shortness of breath and orthopnea. Patient reports a binge usual state of health until just a few days ago when he developed orthopnea. He also began to develop shortness of breath with exertion that has progressively worsened to the point where he has difficulty ambulating more than a few steps. He denies any chest pain associated with this. Reports some mild lower extremity edema bilaterally. Denies any significant cough, and denies fevers or chills. Reports adherence with his medications and denies any dietary indiscretions.   [facility] ED Course: Upon arrival to the ED, patient is found to be afebrile, saturating low 90s on room air at rest, hypertensive 85/115, and vitals otherwise normal. EKG features a paced rhythm and chest x-ray is notable for stable cardiomegaly and mild vascular congestion. Chemistry panel features a creatinine of 2.10, slightly up from priors. CBC is unremarkable, BNP is elevated to 681, and troponin is elevated to 0.20. Patient was treated with Vasotec, nitroglycerin ointment, and 40 mg IV Lasix in the ED. Blood pressure improved and patient began to diuresis, but he remains dyspneic with minimal exertion and will be admitted for ongoing evaluation and management.   Review of Systems:  All other systems reviewed and apart from HPI, are negative.  Past Medical History:  Diagnosis Date  . Angina at rest [facility]) 07/08/2016  . CAD (coronary artery disease) 2010   3v CABG  . Chest pain at rest 07/08/2016  . CHF (congestive heart  failure) ([place]) 01/26/2012  . CKD (chronic kidney disease) 02/19/2012  . COPD with chronic bronchitis ([place]) 08/26/2016  . Diabetes mellitus type 2, uncontrolled ([place]) 01/26/2012  . Elevated liver function tests 01/26/2012  . Gout 01/26/2012  . Hx of CABG 2010   RCA Stent 2008, CABG x 2 [facility] 2010  . Hypercholesterolemia 07/08/2016  . Hypertension 09/04/2014  . Ischemic cardiomyopathy 11/15/2014  . Kidney stone 10/25/2015  . Lipid disorder 01/26/2012  . NSTEMI (non-ST elevated myocardial infarction) ([place]) 07/08/2016  . ST elevation myocardial infarction (STEMI) ([place]) 2010  . Unstable angina ([place]) 11/14/2014    Past Surgical History:  Procedure Laterality Date  . BIV ICD INSERTION CRT-D N/A 10/21/2017   Procedure: BIV ICD INSERTION CRT-D;  Surgeon: Constance Haw, MD;  Location: [place] CV [facility];  Service: Cardiovascular;  Laterality: N/A;  . CORONARY ARTERY BYPASS GRAFT  2010  . KNEE SURGERY    . RIGHT/LEFT HEART CATH AND CORONARY ANGIOGRAPHY N/A 04/15/2017   Procedure: Right/Left Heart Cath and Coronary Angiography;  Surgeon: Larey Dresser, MD;  Location: [place] CV [facility];  Service: Cardiovascular;  Laterality: N/A;  . ROTATOR CUFF REPAIR    . WRIST SURGERY       reports that he quit smoking about 2 years ago. He has a 15.00 pack-year smoking history. He has never used smokeless tobacco. He reports that he does not drink alcohol or use drugs.  Allergies  Allergen Reactions  . Hydrocodone Itching  . Tramadol Nausea Only    Family History  Problem Relation Age of Onset  . Hypertension Mother   .  Diabetes Mother   . Hyperlipidemia Mother   . Kidney failure Mother   . Emphysema Father      Prior to Admission medications   Medication Sig Start Date End Date Taking? Authorizing Provider  albuterol (PROVENTIL HFA;VENTOLIN HFA) 108 (90 Base) MCG/ACT inhaler Inhale 2 puffs into the lungs every 6 (six) hours as needed for wheezing or shortness of breath.    [provider]  albuterol (PROVENTIL) (2.5 MG/3ML) 0.083% nebulizer solution INHALE CONTENTS OF 1 VIAL VIA NEBULIZER EVERY 6 HOURS AS NEEDED FOR WHEEZING/SHORTNESS OF BREATH 05/09/18   Wendling, Crosby Oyster, DO  allopurinol (ZYLOPRIM) 100 MG tablet Take 1 tablet (100 mg total) by mouth daily. Take with 300 mg tab for total of 400 mg daily. 03/09/18   Shelda Pal, DO  allopurinol (ZYLOPRIM) 300 MG tablet Take 300 mg by mouth 2 (two) times daily.    [provider]  aspirin EC 81 MG EC tablet Take 1 tablet (81 mg total) by mouth daily. 04/16/17   Clegg, Amy D, NP  atorvastatin (LIPITOR) 80 MG tablet Take 1 tablet (80 mg total) by mouth daily. 08/26/16   Shelda Pal, DO  carvedilol (COREG) 6.25 MG tablet Take 1.5 tablets (9.375 mg total) by mouth 2 (two) times daily. 12/15/17   Clegg, Amy D, NP  COLCRYS 0.6 MG tablet TAKE 1 TABLET BY MOUTH TWICE A DAY 01/28/18   Wendling, Crosby Oyster, DO  digoxin (LANOXIN) 0.125 MG tablet Take 1 tablet (0.125 mg total) by mouth daily. 10/27/17   Clegg, Amy D, NP  diphenhydrAMINE (BENADRYL) 25 MG tablet Take 25 mg by mouth daily as needed for itching.    [provider]  empagliflozin (JARDIANCE) 10 MG TABS tablet Take 10 mg by mouth daily. 02/01/18   Shelda Pal, DO  glucose blood test strip Use as instructed 08/26/16   Shelda Pal, DO  Investigational - Study Medication Take 1 tablet by mouth 2 (two) times daily. Study name: Galactic HF Study Additional study details: Omecamtiv Mecarbil or Placebo 04/15/17   Larey Dresser, MD  isosorbide-hydrALAZINE (BIDIL) 20-37.5 MG tablet Take 0.5 tablets by mouth 3 (three) times daily.    [provider]  Lancets (FREESTYLE) lancets Use to check sugars daily 08/26/16   Shelda Pal, DO  metFORMIN (GLUCOPHAGE) 500 MG tablet Take 1 tablet (500 mg total) by mouth 2 (two) times daily with a meal. 01/03/18   Wendling, Crosby Oyster, DO  orphenadrine  (NORFLEX) 100 MG tablet Take 1 tablet (100 mg total) by mouth 2 (two) times daily. 02/19/18   Charlesetta Shanks, MD  oxyCODONE-acetaminophen (PERCOCET) 5-325 MG tablet Take 1-2 tablets by mouth every 4 (four) hours as needed. 02/19/18   Charlesetta Shanks, MD  pantoprazole (PROTONIX) 40 MG tablet Take 1 tablet (40 mg total) by mouth daily. 03/11/18   Larey Dresser, MD  sacubitril-valsartan (ENTRESTO) 49-51 MG Take 1 tablet by mouth 2 (two) times daily. 02/03/18   Camnitz, Ocie Doyne, MD  spironolactone (ALDACTONE) 25 MG tablet Take 25 mg by mouth daily.    [provider]  torsemide (DEMADEX) 20 MG tablet Take 20 mg 2 (two) times daily by mouth.    [provider]    Physical Exam: Vitals:   07/16/18 0100 07/16/18 0130 07/16/18 0200 07/16/18 0358  BP:  (!) 166/90 (!) 164/109 (!) 161/105  Pulse: 72 (!) 57 68 63  Resp: 14 14 18 18   Temp:  TempSrc:    Oral  SpO2: 99% 98% 96%   Weight:    86.4 kg (190 lb 8 oz)  Height:    5\' 10"  (1.778 m)      Constitutional: NAD, calm  Eyes: PERTLA, lids and conjunctivae normal ENMT: Mucous membranes are moist. Posterior pharynx clear of any exudate or lesions.   Neck: normal, supple, no masses, no thyromegaly Respiratory: Dyspneic with speech, no wheezing. Normal respiratory effort.   Cardiovascular: S1 & S2 heard, regular rate and rhythm. No extremity edema. JVP 10 mmH2O. Abdomen: No distension, no tenderness, soft. Bowel sounds normal.  Musculoskeletal: no clubbing / cyanosis. No joint deformity upper and lower extremities.    Skin: no significant rashes, lesions, ulcers. Warm, dry, well-perfused. Neurologic: CN 2-12 grossly intact. Sensation intact. Strength 5/5 in all 4 limbs.  Psychiatric: Alert and oriented x 3. Calm, cooperative.     Labs on Admission: I have personally reviewed following labs and imaging studies  CBC: Recent Labs  Lab 07/15/18 2209  WBC 4.5  HGB 13.3  HCT 40.3  MCV 93.5  PLT 366   Basic Metabolic  Panel: Recent Labs  Lab 07/15/18 2209  NA 138  K 4.0  CL 98  CO2 29  GLUCOSE 175*  BUN 37*  CREATININE 2.10*  CALCIUM 8.8*   GFR: Estimated Creatinine Clearance: 37.7 mL/min (A) (by C-G formula based on SCr of 2.1 mg/dL (H)). Liver Function Tests: Recent Labs  Lab 07/15/18 2209  AST 25  ALT 14  ALKPHOS 69  BILITOT 0.9  PROT 8.5*  ALBUMIN 3.7   No results for input(s): LIPASE, AMYLASE in the last 168 hours. No results for input(s): AMMONIA in the last 168 hours. Coagulation Profile: No results for input(s): INR, PROTIME in the last 168 hours. Cardiac Enzymes: Recent Labs  Lab 07/15/18 2209  TROPONINI 0.20*   BNP (last 3 results) No results for input(s): PROBNP in the last 8760 hours. HbA1C: No results for input(s): HGBA1C in the last 72 hours. CBG: No results for input(s): GLUCAP in the last 168 hours. Lipid Profile: No results for input(s): CHOL, HDL, LDLCALC, TRIG, CHOLHDL, LDLDIRECT in the last 72 hours. Thyroid Function Tests: No results for input(s): TSH, T4TOTAL, FREET4, T3FREE, THYROIDAB in the last 72 hours. Anemia Panel: No results for input(s): VITAMINB12, FOLATE, FERRITIN, TIBC, IRON, RETICCTPCT in the last 72 hours. Urine analysis:    Component Value Date/Time   COLORURINE YELLOW 02/19/2018 1301   APPEARANCEUR CLEAR 02/19/2018 1301   LABSPEC 1.025 02/19/2018 1301   PHURINE 6.0 02/19/2018 1301   GLUCOSEU >=500 (A) 02/19/2018 1301   HGBUR TRACE (A) 02/19/2018 1301   BILIRUBINUR NEGATIVE 02/19/2018 1301   KETONESUR NEGATIVE 02/19/2018 1301   PROTEINUR 100 (A) 02/19/2018 1301   UROBILINOGEN 1.0 10/12/2015 1830   NITRITE NEGATIVE 02/19/2018 1301   LEUKOCYTESUR NEGATIVE 02/19/2018 1301   Sepsis Labs: @LABRCNTIP (procalcitonin:4,lacticidven:4) )No results found for this or any previous visit (from the past 240 hour(s)).   Radiological Exams on Admission: Dg Chest 2 View  Result Date: 07/15/2018 CLINICAL DATA:  Shortness of breath for 2 days.  History of CHF and bronchitis. EXAM: CHEST - 2 VIEW COMPARISON:  Chest radiograph May 21, 2018 FINDINGS: Stable cardiomegaly. Status post median sternotomy for CABG. Calcified aortic arch. Three lead LEFT cardiac defibrillator in situ. Mild pulmonary vascular congestion. No pleural effusion or focal consolidation. RIGHT lung base scarring. No pneumothorax. Suture anchor RIGHT humeral head. IMPRESSION: Stable cardiomegaly and mild pulmonary vascular congestion. Aortic Atherosclerosis (  ICD10-I70.0). Electronically Signed   By: Elon Alas M.D.   On: 07/15/2018 22:50    EKG: Independently reviewed. Paced rhythm.   Assessment/Plan  1. Acute on chronic diastolic CHF  - Presents with a few days of progressive DOE and orthopnea with mild bilateral LE edema  - Found to have JVD, vascular congestion on CXR  - Echo from December '18 with preserved EF; EF had been 10-15% previously and he has AICD in place  - He was given Vasotec, Lasix 40 mg IV, and NTG ointment in ED, began to diurese and reported some improvement but remained dyspneic with minimal exertion and unable to lie down  - Continue diuresis with Lasix 40 mg IV q12h, continue beta-blocker, hold Entresto initially as ACE-i was given in ED, update echo    2. CAD; elevated troponin  - Patient denies chest pain  - Troponin is elevated to 0.20 in ED, likely secondary to #1  - Continue cardiac monitoring, trend troponin, continue ASA, beta-blocker, statin    3. Hypertension with hypertensive urgency  - BP 185/115 in ED, improved with Vasotec, IV Lasix, and NTG ointment - Continue Coreg and Bidil as tolerated, hold Entresto initially as Vasotec given in ED   4. CKD stage III  - SCr is 2.10 on admission, up from 1.81 in May  - He was given Vasotec in ED so will hold Entresto initially, renally-dose medications, follow daily chem panel during diuresis   5. Type II DM  - A1c was 8.4% in February  - Managed at home with metformin and  Jardiance, held on admission  - Check CBG's and use SSI while in hospital   6. COPD  - No wheezing, continue albuterol prn    DVT prophylaxis: sq heparin  Code Status: Full  Family Communication: Discussed with patient  Consults called: None Admission status: Inpatient     Vianne Bulls, MD [facility] Pager [phone]  If 7PM-7AM, please contact night-coverage [email or web address] Password [email or web address]  07/16/2018, 4:14 AM

## 2018-07-16 NOTE — ED Provider Notes (Signed)
Care assumed from Dr. Tyrone Nine at shift change.  Patient with history of coronary artery disease with CABG, ischemic cardiomyopathy with pacer/defibrillator and EF of 15%.  He presents today with shortness of breath and appears to be an acute on chronic congestive heart failure.  He has an elevated BNP, slightly elevated troponin, and chest x-ray consistent with this.  He was given intravenous Lasix with a good diuresis, however continues to be tachypneic and desaturates with ambulation.  I feel as though he will require admission for additional diuresis and work-up.  I have discussed the care with Dr. Tamala Julian from the hospitalist service who agrees to admit.   Veryl Speak, MD 07/16/18 [phone]

## 2018-07-17 DIAGNOSIS — I160 Hypertensive urgency: Secondary | ICD-10-CM

## 2018-07-17 DIAGNOSIS — J449 Chronic obstructive pulmonary disease, unspecified: Secondary | ICD-10-CM

## 2018-07-17 DIAGNOSIS — R748 Abnormal levels of other serum enzymes: Secondary | ICD-10-CM

## 2018-07-17 DIAGNOSIS — N183 Chronic kidney disease, stage 3 (moderate): Secondary | ICD-10-CM

## 2018-07-17 DIAGNOSIS — I10 Essential (primary) hypertension: Secondary | ICD-10-CM

## 2018-07-17 DIAGNOSIS — E1165 Type 2 diabetes mellitus with hyperglycemia: Secondary | ICD-10-CM

## 2018-07-17 DIAGNOSIS — I2510 Atherosclerotic heart disease of native coronary artery without angina pectoris: Secondary | ICD-10-CM

## 2018-07-17 LAB — BASIC METABOLIC PANEL
ANION GAP: 9 (ref 5–15)
BUN: 40 mg/dL — ABNORMAL HIGH (ref 8–23)
CALCIUM: 9 mg/dL (ref 8.9–10.3)
CHLORIDE: 100 mmol/L (ref 98–111)
CO2: 30 mmol/L (ref 22–32)
Creatinine, Ser: 2.18 mg/dL — ABNORMAL HIGH (ref 0.61–1.24)
GFR calc non Af Amer: 31 mL/min — ABNORMAL LOW (ref >60)
GFR, EST AFRICAN AMERICAN: 36 mL/min — AB (ref >60)
GLUCOSE: 146 mg/dL — AB (ref 70–99)
POTASSIUM: 3.6 mmol/L (ref 3.5–5.1)
Sodium: 139 mmol/L (ref 135–145)

## 2018-07-17 LAB — GLUCOSE, CAPILLARY
GLUCOSE-CAPILLARY: 184 mg/dL — AB (ref 70–99)
Glucose-Capillary: 131 mg/dL — ABNORMAL HIGH (ref 70–99)

## 2018-07-17 MED ORDER — CARVEDILOL 12.5 MG PO TABS: 12.5000 mg | ORAL_TABLET | Freq: Two times a day (BID) | ORAL | 0 refills | Status: DC

## 2018-07-17 MED ORDER — CANAGLIFLOZIN 100 MG PO TABS: 100.0000 mg | ORAL_TABLET | Freq: Every day | ORAL | Status: DC

## 2018-07-17 MED ORDER — FUROSEMIDE 10 MG/ML IJ SOLN
120.0000 mg | Freq: Two times a day (BID) | INTRAVENOUS | Status: DC
  Administered 2018-07-17: 120 mg via INTRAVENOUS
  Filled 2018-07-17: qty 12
  Filled 2018-07-17: qty 10

## 2018-07-17 NOTE — Progress Notes (Signed)
Discharge instructions given to patient, all questions answered. Patient is waiting for a cab ride from [employer] [place], [place].

## 2018-07-17 NOTE — Discharge Summary (Signed)
Physician Discharge Summary  Patrick Stewart BJY:[id number] DOB: [birth year] DOA: 07/15/2018  PCP: Shelda Pal, DO  Admit date: 07/15/2018 Discharge date: 07/17/2018  Admitted From: Home  Disposition:  Home   Recommendations for Outpatient Follow-up:  1. Follow up with PCP in 1 week 2. Follow up with CHF clinic in 1-2 weeks 3. Please obtain BMP in one week   Home Health: None  Equipment/Devices: None  Discharge Condition: Good  CODE STATUS: FULL Diet recommendation: Cardiac, low sodium  Brief/Interim Summary: Patrick Stewart is a 62 y.o. M with CHF EF 10%, now recovered, ICD in place, on Galactic study, CKD III, HTN, and NIDDM who presents with several days orthopnea, waking at night with trouble breathing, relieved with sitting up and dyspnea on exertion.  CXR showed congestion, started on IV Lasix.       Discharge Diagnoses:   Acute on chronic diastolic CHF EF had been 08-65% previously and he has AICD in place, but Echo from Dec 2018 showed "preserved EF".    He now presents with orthopnea, DOE, PND. Echo repeat here shows EF back down to 30%. Given IV Lasix and symptoms improved, weight down 2Kg, discharge weight 188 lbs.  [employer] queried ICD and Optivol index suggested euvolemia.  Continued on BB, spironolactone, digoxin, Entresto, and study drug.  Dischaged back on Torsemide BID.     Hypertension with hypertensive urgency BP 185/115 in ED, improved with Vasotec, IV Lasix, and NTG ointment initially.     CAD; elevated troponin No chest pain, low flat trajectory, from CHF, hypertension.    CKD stage III SCr is 2.10 on admission, up from 1.81 in May, slightly up after diuresis.   -Repeat Cr in 1 week   Type II DM A1c was 8.4% in February. Glucoses controlled.    COPD No active disease          Discharge Instructions  Discharge Instructions    Diet - low sodium heart healthy   Complete by:  As directed    Discharge instructions    Complete by:  As directed    From Dr. Loleta Stewart: You were admitted to the hospital with feeling out of breath with walking or laying down.  This appears to have been from congestive heart failure.  Resume your home medicines: Torsemide 20 mg twice daily Spironolactone 25 mg daily Entresto 49-51 mg daily Digoxin 0.125 mg daily Carvedilol 12.5 mg twice daily  I have sent a request to Dr. Oleh Stewart' office the Heart failure clinic to arrange a follow up appointment with you.  If you haven't heard from them by the end of the week, call them at [phone] If they can't schedule you for an appoitment within 2 weeks, call your primary care doctor, Dr. Nani Stewart for a follow up appointment this week, and have your kidney function checked.   Increase activity slowly   Complete by:  As directed      Allergies as of 07/17/2018      Reactions   Hydrocodone Itching   Tramadol Nausea Only      Medication List    TAKE these medications   albuterol 108 (90 Base) MCG/ACT inhaler Commonly known as:  PROVENTIL HFA;VENTOLIN HFA Inhale 2 puffs into the lungs every 6 (six) hours as needed for wheezing or shortness of breath.   albuterol (2.5 MG/3ML) 0.083% nebulizer solution Commonly known as:  PROVENTIL INHALE CONTENTS OF 1 VIAL VIA NEBULIZER EVERY 6 HOURS AS NEEDED FOR WHEEZING/SHORTNESS OF BREATH   allopurinol 300  MG tablet Commonly known as:  ZYLOPRIM Take 300 mg by mouth See admin instructions. Take one tablet (300 mg) by mouth daily with a 100 mg tablet for a total dose of 400 mg daily What changed:  Another medication with the same name was changed. Make sure you understand how and when to take each.   allopurinol 100 MG tablet Commonly known as:  ZYLOPRIM Take 1 tablet (100 mg total) by mouth daily. Take with 300 mg tab for total of 400 mg daily. What changed:    when to take this  additional instructions   aspirin 81 MG EC tablet Take 1 tablet (81 mg total) by mouth daily.    atorvastatin 80 MG tablet Commonly known as:  LIPITOR Take 1 tablet (80 mg total) by mouth daily.   BC HEADACHE POWDER PO Take 1 packet by mouth daily as needed (headache/pain).   carvedilol 12.5 MG tablet Commonly known as:  COREG Take 1 tablet (12.5 mg total) by mouth 2 (two) times daily with a meal. What changed:    medication strength  how much to take  when to take this   COLCRYS 0.6 MG tablet Generic drug:  colchicine TAKE 1 TABLET BY MOUTH TWICE A DAY What changed:    how much to take  how to take this  when to take this   digoxin 0.125 MG tablet Commonly known as:  LANOXIN Take 1 tablet (0.125 mg total) by mouth daily.   diphenhydrAMINE 25 MG tablet Commonly known as:  BENADRYL Take 25 mg by mouth daily as needed for itching.   empagliflozin 10 MG Tabs tablet Commonly known as:  JARDIANCE Take 10 mg by mouth daily.   freestyle lancets Use to check sugars daily   glucose blood test strip Use as instructed   Investigational - Study Medication Take 1 tablet by mouth 2 (two) times daily. Study name: Galactic HF Study Additional study details: Omecamtiv Mecarbil or Placebo What changed:  additional instructions   metFORMIN 500 MG tablet Commonly known as:  GLUCOPHAGE Take 1 tablet (500 mg total) by mouth 2 (two) times daily with a meal.   orphenadrine 100 MG tablet Commonly known as:  NORFLEX Take 1 tablet (100 mg total) by mouth 2 (two) times daily.   oxyCODONE-acetaminophen 5-325 MG tablet Commonly known as:  PERCOCET Take 1-2 tablets by mouth every 4 (four) hours as needed.   sacubitril-valsartan 49-51 MG Commonly known as:  ENTRESTO Take 1 tablet by mouth 2 (two) times daily.   spironolactone 25 MG tablet Commonly known as:  ALDACTONE Take 25 mg by mouth daily.   torsemide 20 MG tablet Commonly known as:  DEMADEX Take 20 mg 2 (two) times daily by mouth.       Allergies  Allergen Reactions  . Hydrocodone Itching  . Tramadol  Nausea Only    Consultations:  None   Procedures/Studies: Dg Chest 2 View  Result Date: 07/15/2018 CLINICAL DATA:  Shortness of breath for 2 days. History of CHF and bronchitis. EXAM: CHEST - 2 VIEW COMPARISON:  Chest radiograph May 21, 2018 FINDINGS: Stable cardiomegaly. Status post median sternotomy for CABG. Calcified aortic arch. Three lead LEFT cardiac defibrillator in situ. Mild pulmonary vascular congestion. No pleural effusion or focal consolidation. RIGHT lung base scarring. No pneumothorax. Suture anchor RIGHT humeral head. IMPRESSION: Stable cardiomegaly and mild pulmonary vascular congestion. Aortic Atherosclerosis (ICD10-I70.0). Electronically Signed   By: Elon Alas M.D.   On: 07/15/2018 22:50   Echo Study  Conclusions  - Procedure narrative: Transthoracic echocardiography. Image   quality was adequate. The study was technically difficult. - Left ventricle: The cavity size was severely dilated. Wall   thickness was increased in a pattern of moderate LVH. Systolic   function was moderately to severely reduced. The estimated   ejection fraction was in the range of 30% to 35%. Diffuse   hypokinesis. Regional wall motion abnormalities cannot be   excluded. - Regional wall motion abnormality: Akinesis of the mid   inferoseptal and entire inferior myocardium. - Aortic valve: There was moderate regurgitation. - Mitral valve: Calcified annulus. Mildly thickened leaflets .   Mobility was mildly restricted. There was mild regurgitation. - Left atrium: The atrium was mildly to moderately dilated. - Right ventricle: The cavity size was severely dilated. Wall   thickness was normal. Systolic function was reduced. - Right atrium: The atrium was moderately dilated. - Tricuspid valve: There was mild regurgitation. - Pulmonic valve: There was moderate regurgitation. - Pulmonary arteries: Systolic pressure was moderately to severely   increased. PA peak pressure: 64 mm Hg  (S).  Impressions:  - Severe LV dysfunction with diffuse hypokinesis and akinesis of   the inferior and inferoseptal walls. Biatrial enlargement, RV   enlargement with reduced RV function. Elevated right sided   filling pressures. Moderate AR, mild MR and TR.    Subjective: No more orthopnea.  Walked in hall and felt at baseline. No leg swelling, no PND.  No chest tightness.  No fever, cough, sputum.    Discharge Exam: Vitals:   07/17/18 0531 07/17/18 1254  BP: 140/90 (!) 151/86  Pulse: 61 62  Resp: 20 20  Temp: 97.6 F (36.4 C) 97.6 F (36.4 C)  SpO2: 95% 92%   Vitals:   07/17/18 0030 07/17/18 0523 07/17/18 0531 07/17/18 1254  BP: 120/84  140/90 (!) 151/86  Pulse: 61  61 62  Resp: 20  20 20   Temp: 98.3 F (36.8 C)  97.6 F (36.4 C) 97.6 F (36.4 C)  TempSrc: Oral  Oral Oral  SpO2: 98%  95% 92%  Weight:  85.5 kg (188 lb 9.6 oz)    Height:        General: Pt is alert, awake, not in acute distress Cardiovascular: RRR, S1/S2 +, no rubs, no gallops Respiratory: CTA bilaterally, no wheezing, no rhonchi Abdominal: Soft, NT, ND, bowel sounds + Extremities: no edema, no cyanosis    The results of significant diagnostics from this hospitalization (including imaging, microbiology, ancillary and laboratory) are listed below for reference.     Microbiology: Recent Results (from the past 240 hour(s))  MRSA PCR Screening     Status: Abnormal   Collection Time: 07/16/18  3:34 AM  Result Value Ref Range Status   MRSA by PCR POSITIVE (A) NEGATIVE Final    Comment:        The GeneXpert MRSA Assay (FDA approved for NASAL specimens only), is one component of a comprehensive MRSA colonization surveillance program. It is not intended to diagnose MRSA infection nor to guide or monitor treatment for MRSA infections. RESULT CALLED TO, READ BACK BY AND VERIFIED WITH: E DODO RN 07/16/18 0633 JDW      Labs: BNP (last 3 results) Recent Labs    08/20/17 0918  05/21/18 0501 07/15/18 2209  BNP 1,190.6* 824.0* 709.6*   Basic Metabolic Panel: Recent Labs  Lab 07/15/18 2209 07/16/18 0444 07/17/18 0521  NA 138  --  139  K 4.0  --  3.6  CL 98  --  100  CO2 29  --  30  GLUCOSE 175*  --  146*  BUN 37*  --  40*  CREATININE 2.10*  --  2.18*  CALCIUM 8.8*  --  9.0  MG  --  1.6*  --    Liver Function Tests: Recent Labs  Lab 07/15/18 2209  AST 25  ALT 14  ALKPHOS 69  BILITOT 0.9  PROT 8.5*  ALBUMIN 3.7   No results for input(s): LIPASE, AMYLASE in the last 168 hours. No results for input(s): AMMONIA in the last 168 hours. CBC: Recent Labs  Lab 07/15/18 2209  WBC 4.5  HGB 13.3  HCT 40.3  MCV 93.5  PLT 185   Cardiac Enzymes: Recent Labs  Lab 07/15/18 2209 07/16/18 0444 07/16/18 0941 07/16/18 1529  TROPONINI 0.20* 0.19* 0.19* 0.17*   BNP: Invalid input(s): POCBNP CBG: Recent Labs  Lab 07/16/18 1220 07/16/18 1630 07/16/18 2105 07/17/18 0735 07/17/18 1134  GLUCAP 141* 141* 157* 131* 184*   D-Dimer No results for input(s): DDIMER in the last 72 hours. Hgb A1c No results for input(s): HGBA1C in the last 72 hours. Lipid Profile No results for input(s): CHOL, HDL, LDLCALC, TRIG, CHOLHDL, LDLDIRECT in the last 72 hours. Thyroid function studies No results for input(s): TSH, T4TOTAL, T3FREE, THYROIDAB in the last 72 hours.  Invalid input(s): FREET3 Anemia work up No results for input(s): VITAMINB12, FOLATE, FERRITIN, TIBC, IRON, RETICCTPCT in the last 72 hours. Urinalysis    Component Value Date/Time   COLORURINE YELLOW 02/19/2018 1301   APPEARANCEUR CLEAR 02/19/2018 1301   LABSPEC 1.025 02/19/2018 1301   PHURINE 6.0 02/19/2018 1301   GLUCOSEU >=500 (A) 02/19/2018 1301   HGBUR TRACE (A) 02/19/2018 1301   BILIRUBINUR NEGATIVE 02/19/2018 1301   KETONESUR NEGATIVE 02/19/2018 1301   PROTEINUR 100 (A) 02/19/2018 1301   UROBILINOGEN 1.0 10/12/2015 1830   NITRITE NEGATIVE 02/19/2018 1301   LEUKOCYTESUR NEGATIVE  02/19/2018 1301   Sepsis Labs Invalid input(s): PROCALCITONIN,  WBC,  LACTICIDVEN Microbiology Recent Results (from the past 240 hour(s))  MRSA PCR Screening     Status: Abnormal   Collection Time: 07/16/18  3:34 AM  Result Value Ref Range Status   MRSA by PCR POSITIVE (A) NEGATIVE Final    Comment:        The GeneXpert MRSA Assay (FDA approved for NASAL specimens only), is one component of a comprehensive MRSA colonization surveillance program. It is not intended to diagnose MRSA infection nor to guide or monitor treatment for MRSA infections. RESULT CALLED TO, READ BACK BY AND VERIFIED WITH: E DODO RN 07/16/18 3202 JDW      Time coordinating discharge: 40 minutes       SIGNED:   Edwin Dada, MD  [facility] 07/17/2018, 4:24 PM

## 2018-07-17 NOTE — Progress Notes (Signed)
PROGRESS NOTE    Patrick Stewart  VPX:[id number] DOB: June 23, 1956 DOA: 07/15/2018 PCP: Shelda Pal, DO      Brief Narrative:  Patrick Stewart is a 62 y.o. male with medical history significant for chronic kidney disease stage III, hypertension, type 2 diabetes mellitus, and chronic CHF with AICD, now presenting to the emergency department for evaluation of shortness of breath and orthopnea. Patient reports a binge usual state of health until just a few days ago when he developed orthopnea. He also began to develop shortness of breath with exertion that has progressively worsened to the point where he has difficulty ambulating more than a few steps. He denies any chest pain associated with this. Reports some mild lower extremity edema bilaterally. Denies any significant cough, and denies fevers or chills. Reports adherence with his medications and denies any dietary indiscretions.      Assessment & Plan:  Acute on chronic diastolic CHF  Echo from December '18 with preserved EF; EF had been 10-15% previously and he has AICD in place.    He now presents with orthopnea, DOE, PND.Echo repeat here shows EF back down to 30%. Only 500 cc out yesterday, weight down slightly.  Symptoms improving slightly. -Increase Lasix to 120 IV BID -Continue BB, spironolactone, digoxin -Discontinue BiDil, not a prior-to-admission medicine -Restart Entresto today  CAD; elevated troponin  No chest pain, torponin trending down.  From CHF. -Continue aspirin, statin  Hypertension with hypertensive urgency  BP 185/115 in ED, improved with Vasotec, IV Lasix, and NTG ointment initially.  Today better after Bidil. -Continue carvedilol, stop Bidil, continue furosemide, restart Entresto   CKD stage III  SCr is 2.10 on admission, up from 1.81 in May, slightly up today. -Daily Cr  Type II DM  A1c was 8.4% in February. Glucoses controlled. -Hold metformin  -Continue Jardiance as formulary alternative  when medication reconciliation complete -SSI corrections  COPD  No active disease  Other medications -Continue allopurinol -Continue PPI     DVT prophylaxis: Heparin Code Status: FULL Family Communication: None present MDM and disposition Plan: The below labs and imaging reports were reviewed and summarized above.  Medication adjustments as above.  The patient was admitted with orthopnea, paroxysmal nocturnal dyspnea, leg swelling, and weight gain.  He has been started on Lasix, had minimal urine output yesterday, titrating up today.  He is lost 3 pounds, his symptoms are starting to improve.  Continue IV diuresis, likely home tomorrow.      Subjective: Orthopnea is improved, did not wake up with dyspnea last night.  Abdomen less full.  Breathing somewhat better.  No cough, fever, sputum.      Objective: Vitals:   07/16/18 1947 07/17/18 0030 07/17/18 0523 07/17/18 0531  BP: 131/74 120/84  140/90  Pulse: 60 61  61  Resp: (!) 22 20  20   Temp: 98.4 F (36.9 C) 98.3 F (36.8 C)  97.6 F (36.4 C)  TempSrc: Oral Oral  Oral  SpO2: 94% 98%  95%  Weight:   85.5 kg (188 lb 9.6 oz)   Height:        Intake/Output Summary (Last 24 hours) at 07/17/2018 1005 Last data filed at 07/17/2018 0959 Gross per 24 hour  Intake 1083 ml  Output 900 ml  Net 183 ml   Filed Weights   07/15/18 2211 07/16/18 0358 07/17/18 0523  Weight: 88 kg (193 lb 14.4 oz) 86.4 kg (190 lb 8 oz) 85.5 kg (188 lb 9.6 oz)    Examination:  BP 140/90 (BP Location: Left Arm)   Pulse 61   Temp 97.6 F (36.4 C) (Oral)   Resp 20   Ht 5\' 10"  (1.778 m)   Wt 85.5 kg (188 lb 9.6 oz) Comment: scale A  SpO2 95%   BMI 27.06 kg/m   General: Adult male, sitting in the edge of the bed, responds appropriate to questions, no acute distress, eye contact appropriate. HEENT: Corneas clear, conjunctivae and sclera normal, lids and lashes normal, anicteric.  Oropharynx moist without oral lesions.  Hearing normal.     Cardiac: Regular rate and rhythm, no murmurs, trace pitting lower extremity edema positions.  jvp Not visible, but EJs are distended. Respiratory: Normal respiratory rate and rhythm, lungs clear, trace rales at the bases. Abdomen: Tenderness palpation, hepatospleno megaly. Extremities: No deformities/injuries.  5/5 grip strength and upper extremity flexion/extension, symmetrically.  Extremities are warm and well-perfused. Neuro: Sensorium intact and responding to questions, speech fluent, naming is grossly intact to the patient's recall, recent and remote as well as general fund of knowledge seem within normal limits.  Moves upper and lower extremities with normal strength and coordination, symmetrically. Psych: Flat affect, normal rate and rhythm of speech, thought content appropriate thought process linear.  Attention concentration normal.       Data Reviewed: I have personally reviewed following labs and imaging studies:  CBC: Recent Labs  Lab 07/15/18 2209  WBC 4.5  HGB 13.3  HCT 40.3  MCV 93.5  PLT 408   Basic Metabolic Panel: Recent Labs  Lab 07/15/18 2209 07/16/18 0444 07/17/18 0521  NA 138  --  139  K 4.0  --  3.6  CL 98  --  100  CO2 29  --  30  GLUCOSE 175*  --  146*  BUN 37*  --  40*  CREATININE 2.10*  --  2.18*  CALCIUM 8.8*  --  9.0  MG  --  1.6*  --    GFR: Estimated Creatinine Clearance: 36.3 mL/min (A) (by C-G formula based on SCr of 2.18 mg/dL (H)). Liver Function Tests: Recent Labs  Lab 07/15/18 2209  AST 25  ALT 14  ALKPHOS 69  BILITOT 0.9  PROT 8.5*  ALBUMIN 3.7   No results for input(s): LIPASE, AMYLASE in the last 168 hours. No results for input(s): AMMONIA in the last 168 hours. Coagulation Profile: No results for input(s): INR, PROTIME in the last 168 hours. Cardiac Enzymes: Recent Labs  Lab 07/15/18 2209 07/16/18 0444 07/16/18 0941 07/16/18 1529  TROPONINI 0.20* 0.19* 0.19* 0.17*   BNP (last 3 results) No results for  input(s): PROBNP in the last 8760 hours. HbA1C: No results for input(s): HGBA1C in the last 72 hours. CBG: Recent Labs  Lab 07/16/18 1220 07/16/18 1630 07/16/18 2105 07/17/18 0735  GLUCAP 141* 141* 157* 131*   Lipid Profile: No results for input(s): CHOL, HDL, LDLCALC, TRIG, CHOLHDL, LDLDIRECT in the last 72 hours. Thyroid Function Tests: No results for input(s): TSH, T4TOTAL, FREET4, T3FREE, THYROIDAB in the last 72 hours. Anemia Panel: No results for input(s): VITAMINB12, FOLATE, FERRITIN, TIBC, IRON, RETICCTPCT in the last 72 hours. Urine analysis:    Component Value Date/Time   COLORURINE YELLOW 02/19/2018 1301   APPEARANCEUR CLEAR 02/19/2018 1301   LABSPEC 1.025 02/19/2018 1301   PHURINE 6.0 02/19/2018 1301   GLUCOSEU >=500 (A) 02/19/2018 1301   HGBUR TRACE (A) 02/19/2018 1301   BILIRUBINUR NEGATIVE 02/19/2018 1301   KETONESUR NEGATIVE 02/19/2018 1301   PROTEINUR 100 (A)  02/19/2018 1301   UROBILINOGEN 1.0 10/12/2015 1830   NITRITE NEGATIVE 02/19/2018 1301   LEUKOCYTESUR NEGATIVE 02/19/2018 1301   Sepsis Labs: @LABRCNTIP (procalcitonin:4,lacticacidven:4)  ) Recent Results (from the past 240 hour(s))  MRSA PCR Screening     Status: Abnormal   Collection Time: 07/16/18  3:34 AM  Result Value Ref Range Status   MRSA by PCR POSITIVE (A) NEGATIVE Final    Comment:        The GeneXpert MRSA Assay (FDA approved for NASAL specimens only), is one component of a comprehensive MRSA colonization surveillance program. It is not intended to diagnose MRSA infection nor to guide or monitor treatment for MRSA infections. RESULT CALLED TO, READ BACK BY AND VERIFIED WITH: E DODO RN 07/16/18 1856 JDW          Radiology Studies: Dg Chest 2 View  Result Date: 07/15/2018 CLINICAL DATA:  Shortness of breath for 2 days. History of CHF and bronchitis. EXAM: CHEST - 2 VIEW COMPARISON:  Chest radiograph May 21, 2018 FINDINGS: Stable cardiomegaly. Status post median sternotomy  for CABG. Calcified aortic arch. Three lead LEFT cardiac defibrillator in situ. Mild pulmonary vascular congestion. No pleural effusion or focal consolidation. RIGHT lung base scarring. No pneumothorax. Suture anchor RIGHT humeral head. IMPRESSION: Stable cardiomegaly and mild pulmonary vascular congestion. Aortic Atherosclerosis (ICD10-I70.0). Electronically Signed   By: Elon Alas M.D.   On: 07/15/2018 22:50        Scheduled Meds: . allopurinol  400 mg Oral Daily  . aspirin EC  81 mg Oral Daily  . atorvastatin  80 mg Oral q1800  . carvedilol  12.5 mg Oral BID WC  . Chlorhexidine Gluconate Cloth  6 each Topical Q0600  . digoxin  0.125 mg Oral Daily  . GALACTIC Study - Placebo / omecamtiv mecarbil ([facility])  1 tablet Oral BID  . heparin  5,000 Units Subcutaneous Q8H  . insulin aspart  0-5 Units Subcutaneous QHS  . insulin aspart  0-9 Units Subcutaneous TID WC  . isosorbide-hydrALAZINE  1 tablet Oral TID  . mupirocin ointment  1 application Nasal BID  . sacubitril-valsartan  1 tablet Oral BID  . sodium chloride flush  3 mL Intravenous Q12H  . spironolactone  25 mg Oral Daily   Continuous Infusions: . sodium chloride 250 mL (07/17/18 0857)  . furosemide 120 mg (07/17/18 0858)     LOS: 1 day    Time spent: 25 minutes   Edwin Dada, MD [facility] 07/17/2018, 10:05 AM     Pager [phone] --- please page though AMION:  [email or web address] If 7PM-7AM, please contact night-coverage

## 2018-07-18 ENCOUNTER — Telehealth (HOSPITAL_COMMUNITY): Payer: Self-pay | Admitting: Vascular Surgery

## 2018-07-18 ENCOUNTER — Other Ambulatory Visit: Payer: Self-pay | Admitting: Family Medicine

## 2018-07-18 ENCOUNTER — Other Ambulatory Visit (HOSPITAL_COMMUNITY): Payer: Self-pay | Admitting: Adult Health

## 2018-07-18 NOTE — Telephone Encounter (Signed)
Left pt message to make hosp f/u w/ NP/PA PER Nira Conn

## 2018-07-19 ENCOUNTER — Other Ambulatory Visit: Payer: Self-pay | Admitting: Family Medicine

## 2018-07-19 ENCOUNTER — Telehealth: Payer: Self-pay

## 2018-07-19 NOTE — Telephone Encounter (Signed)
07/19/18  Transition Care Management Follow-up Telephone Call  ADMISSION DATE: 07/15/2018 DISCHARGE DATE: 07/17/2018  BMP to be drawn   How have you been since you were released from the hospital? Feeling good per patient.   Do you understand why you were in the hospital? Yes   Do you understand the discharge instrcutions? Yes    Items Reviewed:  Medications reviewed: Reviewed   Allergies reviewed: Reviewed   Dietary changes reviewed: Low sodium Heart healthy   Referrals reviewed: Appointment scheduled.   Functional Questionnaire:   Activities of Daily Living (ADLs): Patient can perform all independently  Any patient concerns? Status of his Kidneys   Confirmed importance and date/time of follow-up visits scheduled: Yes   Confirmed with patient if condition begins to worsen call PCP or go to the ER. Yes    Patient was given the office number and encouragred to call back with questions or concerns. Yes

## 2018-07-25 ENCOUNTER — Encounter: Payer: Self-pay | Admitting: Family Medicine

## 2018-07-25 ENCOUNTER — Ambulatory Visit (INDEPENDENT_AMBULATORY_CARE_PROVIDER_SITE_OTHER): Payer: Medicare Other | Admitting: Family Medicine

## 2018-07-25 VITALS — BP 132/82 | HR 63 | Temp 98.6°F | Ht 70.0 in | Wt 191.1 lb

## 2018-07-25 DIAGNOSIS — I5033 Acute on chronic diastolic (congestive) heart failure: Secondary | ICD-10-CM

## 2018-07-25 LAB — BASIC METABOLIC PANEL
BUN: 41 mg/dL — AB (ref 6–23)
CALCIUM: 8.9 mg/dL (ref 8.4–10.5)
CO2: 28 meq/L (ref 19–32)
Chloride: 97 mEq/L (ref 96–112)
Creatinine, Ser: 1.78 mg/dL — ABNORMAL HIGH (ref 0.40–1.50)
GFR: 49.92 mL/min — AB (ref >60.00)
GLUCOSE: 286 mg/dL — AB (ref 70–99)
Potassium: 4.1 mEq/L (ref 3.5–5.1)
SODIUM: 136 meq/L (ref 135–145)

## 2018-07-25 NOTE — Patient Instructions (Addendum)
Mind your salt.   Keep an eye on your weight.  We will be in contact regarding your lab results.  Let us know if you need anything.

## 2018-07-25 NOTE — Progress Notes (Signed)
Pre visit review using our clinic review tool, if applicable. No additional management support is needed unless otherwise documented below in the visit note. 

## 2018-07-25 NOTE — Progress Notes (Signed)
Chief Complaint  Patient presents with  . Hospitalization Follow-up    HPI Patrick Stewart is a 62 y.o. y.o. male who presents for a transition of care visit.  Pt was discharged from [facility] on 07/17/18.  Within 48 business hours of discharge our office contacted him via telephone to coordinate his care and needs.   The patient was admitted to [facility] on 7/19 and discharged on 7/21.  He had fluid overload and was diagnosed with acute on chronic congestive heart failure.  He diuresed several pounds and feels much better.  He no longer has swelling in his lower extremities.  His breathing is now unlabored.  He has gained a few pounds back, however has no swelling or shortness of breath to show for it.  He admits that he ate salty foods prior to admission.  He has not been doing so since being discharged.  Past Medical History:  Diagnosis Date  . Angina at rest [facility]) 07/08/2016  . CAD (coronary artery disease) 2010   3v CABG  . Chest pain at rest 07/08/2016  . CHF (congestive heart failure) ([place]) 01/26/2012  . CKD (chronic kidney disease) 02/19/2012  . COPD with chronic bronchitis ([place]) 08/26/2016  . Diabetes mellitus type 2, uncontrolled ([place]) 01/26/2012  . Elevated liver function tests 01/26/2012  . Gout 01/26/2012  . Hx of CABG 2010   RCA Stent 2008, CABG x 2 [facility] 2010  . Hypercholesterolemia 07/08/2016  . Hypertension 09/04/2014  . Ischemic cardiomyopathy 11/15/2014  . Kidney stone 10/25/2015  . Lipid disorder 01/26/2012  . NSTEMI (non-ST elevated myocardial infarction) ([place]) 07/08/2016  . ST elevation myocardial infarction (STEMI) ([place]) 2010  . Unstable angina ([place]) 11/14/2014   Family History  Problem Relation Age of Onset  . Hypertension Mother   . Diabetes Mother   . Hyperlipidemia Mother   . Kidney failure Mother   . Emphysema Father    Allergies as of 07/25/2018      Reactions   Hydrocodone Itching   Tramadol Nausea Only      Medication List        Accurate  as of 07/25/18 11:52 AM. Always use your most recent med list.          albuterol 108 (90 Base) MCG/ACT inhaler Commonly known as:  PROVENTIL HFA;VENTOLIN HFA Inhale 2 puffs into the lungs every 6 (six) hours as needed for wheezing or shortness of breath.   albuterol (2.5 MG/3ML) 0.083% nebulizer solution Commonly known as:  PROVENTIL INHALE CONTENTS OF 1 VIAL VIA NEBULIZER EVERY 6 HOURS AS NEEDED FOR WHEEZING/SHORTNESS OF BREATH   allopurinol 300 MG tablet Commonly known as:  ZYLOPRIM Take 300 mg by mouth See admin instructions. Take one tablet (300 mg) by mouth daily with a 100 mg tablet for a total dose of 400 mg daily   allopurinol 100 MG tablet Commonly known as:  ZYLOPRIM Take 1 tablet (100 mg total) by mouth daily. Take with 300 mg tab for total of 400 mg daily.   aspirin 81 MG EC tablet Take 1 tablet (81 mg total) by mouth daily.   atorvastatin 80 MG tablet Commonly known as:  LIPITOR Take 1 tablet (80 mg total) by mouth daily.   BC HEADACHE POWDER PO Take 1 packet by mouth daily as needed (headache/pain).   carvedilol 12.5 MG tablet Commonly known as:  COREG Take 1 tablet (12.5 mg total) by mouth 2 (two) times daily with a meal.   COLCRYS 0.6 MG tablet  Generic drug:  colchicine TAKE 1 TABLET BY MOUTH TWICE A DAY   digoxin 0.125 MG tablet Commonly known as:  LANOXIN Take 1 tablet (0.125 mg total) by mouth daily.   diphenhydrAMINE 25 MG tablet Commonly known as:  BENADRYL Take 25 mg by mouth daily as needed for itching.   empagliflozin 10 MG Tabs tablet Commonly known as:  JARDIANCE Take 10 mg by mouth daily.   freestyle lancets Use to check sugars daily   glucose blood test strip Use as instructed   Investigational - Study Medication Take 1 tablet by mouth 2 (two) times daily. Study name: Galactic HF Study Additional study details: Omecamtiv Mecarbil or Placebo   metFORMIN 500 MG tablet Commonly known as:  GLUCOPHAGE Take 1 tablet (500 mg total)  by mouth 2 (two) times daily with a meal.   orphenadrine 100 MG tablet Commonly known as:  NORFLEX Take 1 tablet (100 mg total) by mouth 2 (two) times daily.   oxyCODONE-acetaminophen 5-325 MG tablet Commonly known as:  PERCOCET Take 1-2 tablets by mouth every 4 (four) hours as needed.   sacubitril-valsartan 49-51 MG Commonly known as:  ENTRESTO Take 1 tablet by mouth 2 (two) times daily.   spironolactone 25 MG tablet Commonly known as:  ALDACTONE Take 25 mg by mouth daily.   spironolactone 25 MG tablet Commonly known as:  ALDACTONE TAKE 0.5 TABLETS (12.5 MG TOTAL) BY MOUTH DAILY.   spironolactone 25 MG tablet Commonly known as:  ALDACTONE Take 1 tablet (25 mg total) by mouth daily.   torsemide 20 MG tablet Commonly known as:  DEMADEX Take 20 mg 2 (two) times daily by mouth.   torsemide 20 MG tablet Commonly known as:  DEMADEX TAKE 2 TABLETS (40 MG TOTAL) BY MOUTH DAILY.       ROS:  Constitutional: No fevers or chills, no weight loss HEENT: No headaches, hearing loss, or runny nose, no sore throat Heart: No chest pain Lungs: No SOB, no cough Abd: No bowel changes, no pain, no N/V GU: No urinary complaints Neuro: No numbness, tingling or weakness Msk: No joint or muscle pain  Objective BP 132/82 (BP Location: Left Arm, Patient Position: Sitting, Cuff Size: Normal)   Pulse 63   Temp 98.6 F (37 C) (Oral)   Ht 5\' 10"  (1.778 m)   Wt 191 lb 2 oz (86.7 kg)   SpO2 98%   BMI 27.42 kg/m  General Appearance:  awake, alert, oriented, in no acute distress and well developed, well nourished Skin:  there are no suspicious lesions or rashes of concern Head/face:  NCAT Eyes:  EOMI, PERRLA Ears:  canals and TMs NI Nose/Sinuses:  negative Mouth/Throat:  Mucosa moist, no lesions; pharynx without erythema, edema or exudate. Neck:  neck- supple, no mass, non-tender and no jvd Lungs: Clear to auscultation.  No rales, rhonchi, or wheezing. Normal effort, no accessory muscle  use. Heart:  Heart sounds are normal.  Regular rate and rhythm without murmur, gallop or rub. No bruits. Abdomen:  BS+, soft, NT, ND, no masses or organomegaly Musculoskeletal:  No muscle group atrophy or asymmetry, gait normal Neurologic:  Alert and oriented x 3, gait normal., reflexes normal and symmetric, strength and  sensation grossly normal Psych exam: Nml mood and affect, age appropriate judgment and insight  Acute on chronic diastolic CHF (congestive heart failure) ([place]) - Plan: Basic metabolic panel  Discharge summary and medication list have been reviewed/reconciled.  Labs pending at the time of discharge have been reviewed  or are still pending at the time of this visit.  Follow-up labs and appointments have been ordered and/or coordinated appropriately. Educational materials regarding the patient's admitting diagnosis provided.  TRANSITIONAL CARE MANAGEMENT CERTIFICATION:  I certify the following are true:   1. Communication with the patient/care giver was made within 2 business days of discharge.  2. Complexity of Medical decision making is moderate.  3. Face to face visit occurred within 14 days of discharge.   F/u in 1 month for diabetes check. The patient voiced understanding and agreement to the plan.  [place], DO 07/25/18 11:52 AM

## 2018-07-27 ENCOUNTER — Encounter: Payer: Self-pay | Admitting: Cardiology

## 2018-07-27 ENCOUNTER — Ambulatory Visit (INDEPENDENT_AMBULATORY_CARE_PROVIDER_SITE_OTHER): Payer: Medicare Other | Admitting: *Deleted

## 2018-07-27 DIAGNOSIS — I255 Ischemic cardiomyopathy: Secondary | ICD-10-CM | POA: Diagnosis not present

## 2018-07-27 NOTE — Progress Notes (Signed)
Remote ICD transmission.   

## 2018-08-01 ENCOUNTER — Inpatient Hospital Stay (HOSPITAL_COMMUNITY): Payer: Medicare Other

## 2018-08-11 LAB — CUP PACEART REMOTE DEVICE CHECK
Battery Remaining Longevity: 110 mo
Battery Voltage: 3 V
Brady Statistic AP VP Percent: 21.91 %
Brady Statistic AS VP Percent: 74.39 %
Brady Statistic AS VS Percent: 2.95 %
Date Time Interrogation Session: 20190731073525
HighPow Impedance: 60 Ohm
Implantable Lead Implant Date: 20181025
Implantable Lead Implant Date: 20181025
Implantable Lead Location: 753858
Implantable Lead Model: 4398
Lead Channel Impedance Value: 212.8 Ohm
Lead Channel Impedance Value: 212.8 Ohm
Lead Channel Impedance Value: 399 Ohm
Lead Channel Impedance Value: 399 Ohm
Lead Channel Impedance Value: 456 Ohm
Lead Channel Impedance Value: 779 Ohm
Lead Channel Impedance Value: 779 Ohm
Lead Channel Pacing Threshold Amplitude: 0.875 V
Lead Channel Pacing Threshold Amplitude: 1.25 V
Lead Channel Pacing Threshold Pulse Width: 0.4 ms
Lead Channel Pacing Threshold Pulse Width: 0.4 ms
Lead Channel Sensing Intrinsic Amplitude: 3.5 mV
Lead Channel Sensing Intrinsic Amplitude: 3.5 mV
Lead Channel Setting Pacing Amplitude: 2 V
Lead Channel Setting Sensing Sensitivity: 0.3 mV
MDC IDC LEAD IMPLANT DT: 20181025
MDC IDC LEAD LOCATION: 753859
MDC IDC LEAD LOCATION: 753860
MDC IDC MSMT LEADCHNL LV IMPEDANCE VALUE: 199.5 Ohm
MDC IDC MSMT LEADCHNL LV IMPEDANCE VALUE: 199.5 Ohm
MDC IDC MSMT LEADCHNL LV IMPEDANCE VALUE: 212.8 Ohm
MDC IDC MSMT LEADCHNL LV IMPEDANCE VALUE: 399 Ohm
MDC IDC MSMT LEADCHNL LV IMPEDANCE VALUE: 456 Ohm
MDC IDC MSMT LEADCHNL LV IMPEDANCE VALUE: 494 Ohm
MDC IDC MSMT LEADCHNL LV IMPEDANCE VALUE: 703 Ohm
MDC IDC MSMT LEADCHNL LV IMPEDANCE VALUE: 722 Ohm
MDC IDC MSMT LEADCHNL LV IMPEDANCE VALUE: 722 Ohm
MDC IDC MSMT LEADCHNL LV PACING THRESHOLD PULSEWIDTH: 0.5 ms
MDC IDC MSMT LEADCHNL RV IMPEDANCE VALUE: 323 Ohm
MDC IDC MSMT LEADCHNL RV IMPEDANCE VALUE: 399 Ohm
MDC IDC MSMT LEADCHNL RV PACING THRESHOLD AMPLITUDE: 0.75 V
MDC IDC MSMT LEADCHNL RV SENSING INTR AMPL: 16.5 mV
MDC IDC MSMT LEADCHNL RV SENSING INTR AMPL: 16.5 mV
MDC IDC PG IMPLANT DT: 20181025
MDC IDC SET LEADCHNL LV PACING AMPLITUDE: 1.75 V
MDC IDC SET LEADCHNL LV PACING PULSEWIDTH: 0.5 ms
MDC IDC SET LEADCHNL RV PACING AMPLITUDE: 2.5 V
MDC IDC SET LEADCHNL RV PACING PULSEWIDTH: 0.4 ms
MDC IDC STAT BRADY AP VS PERCENT: 0.75 %
MDC IDC STAT BRADY RA PERCENT PACED: 22.5 %
MDC IDC STAT BRADY RV PERCENT PACED: 46.08 %

## 2018-08-12 ENCOUNTER — Telehealth: Payer: Self-pay | Admitting: *Deleted

## 2018-08-12 NOTE — Telephone Encounter (Signed)
Left message for patient to call the research office to schedule an appointment for the [facility] HF study.

## 2018-09-01 ENCOUNTER — Telehealth: Payer: Self-pay | Admitting: *Deleted

## 2018-09-01 NOTE — Telephone Encounter (Signed)
Trying to schedule a clinic visit for the [facility] HF study.  Left voicemail and office phone number.

## 2018-09-02 ENCOUNTER — Telehealth: Payer: Self-pay | Admitting: *Deleted

## 2018-09-02 NOTE — Telephone Encounter (Signed)
Attempted to reach patient to schedule an appointment for the [facility] HF study.  LMOM

## 2018-10-09 ENCOUNTER — Other Ambulatory Visit: Payer: Self-pay

## 2018-10-09 ENCOUNTER — Encounter (HOSPITAL_BASED_OUTPATIENT_CLINIC_OR_DEPARTMENT_OTHER): Payer: Self-pay | Admitting: Emergency Medicine

## 2018-10-09 ENCOUNTER — Emergency Department (HOSPITAL_BASED_OUTPATIENT_CLINIC_OR_DEPARTMENT_OTHER)
Admission: EM | Admit: 2018-10-09 | Discharge: 2018-10-09 | Disposition: A | Discharge status: Home / Self Care | Payer: Medicare Other | Attending: Emergency Medicine | Admitting: Emergency Medicine

## 2018-10-09 ENCOUNTER — Emergency Department (HOSPITAL_BASED_OUTPATIENT_CLINIC_OR_DEPARTMENT_OTHER): Payer: Medicare Other

## 2018-10-09 DIAGNOSIS — N183 Chronic kidney disease, stage 3 (moderate): Secondary | ICD-10-CM | POA: Diagnosis not present

## 2018-10-09 DIAGNOSIS — I130 Hypertensive heart and chronic kidney disease with heart failure and stage 1 through stage 4 chronic kidney disease, or unspecified chronic kidney disease: Secondary | ICD-10-CM | POA: Diagnosis not present

## 2018-10-09 DIAGNOSIS — G8929 Other chronic pain: Secondary | ICD-10-CM | POA: Insufficient documentation

## 2018-10-09 DIAGNOSIS — M25512 Pain in left shoulder: Secondary | ICD-10-CM | POA: Insufficient documentation

## 2018-10-09 DIAGNOSIS — Z7982 Long term (current) use of aspirin: Secondary | ICD-10-CM | POA: Insufficient documentation

## 2018-10-09 DIAGNOSIS — Z7984 Long term (current) use of oral hypoglycemic drugs: Secondary | ICD-10-CM | POA: Diagnosis not present

## 2018-10-09 DIAGNOSIS — M25511 Pain in right shoulder: Secondary | ICD-10-CM | POA: Insufficient documentation

## 2018-10-09 DIAGNOSIS — Z87891 Personal history of nicotine dependence: Secondary | ICD-10-CM | POA: Diagnosis not present

## 2018-10-09 DIAGNOSIS — Z951 Presence of aortocoronary bypass graft: Secondary | ICD-10-CM | POA: Diagnosis not present

## 2018-10-09 DIAGNOSIS — I5033 Acute on chronic diastolic (congestive) heart failure: Secondary | ICD-10-CM | POA: Insufficient documentation

## 2018-10-09 DIAGNOSIS — Z79899 Other long term (current) drug therapy: Secondary | ICD-10-CM | POA: Insufficient documentation

## 2018-10-09 DIAGNOSIS — I2510 Atherosclerotic heart disease of native coronary artery without angina pectoris: Secondary | ICD-10-CM | POA: Diagnosis not present

## 2018-10-09 MED ORDER — OXYCODONE-ACETAMINOPHEN 5-325 MG PO TABS
2.0000 | ORAL_TABLET | Freq: Once | ORAL | Status: AC
  Administered 2018-10-09: 2 via ORAL
  Filled 2018-10-09: qty 2

## 2018-10-09 MED ORDER — OXYCODONE-ACETAMINOPHEN 5-325 MG PO TABS: 1.0000 | ORAL_TABLET | Freq: Four times a day (QID) | ORAL | 0 refills | Status: DC | PRN

## 2018-10-09 MED ORDER — DICLOFENAC SODIUM 1 % TD GEL: 4.0000 g | Freq: Four times a day (QID) | TRANSDERMAL | 0 refills | Status: AC

## 2018-10-09 MED ORDER — DICLOFENAC SODIUM 1 % TD GEL: 4.0000 g | Freq: Four times a day (QID) | TRANSDERMAL | 0 refills | Status: DC

## 2018-10-09 NOTE — ED Provider Notes (Signed)
[place] EMERGENCY DEPARTMENT Provider Note   CSN: [id number] Arrival date & time: 10/09/18  0138     History   Chief Complaint Chief Complaint  Patient presents with  . Shoulder Pain    HPI Patrick Stewart is a 62 y.o. male.  HPI 61 year old male with extensive past medical history as below including chronic arthritis and previous right rotator cuff surgery here with shoulder pain.  Patient states his primary issue is right shoulder pain.  He states that he awoke several days ago with an aching, throbbing, reproducible sensation and pain in his right shoulder.  He sleeps on his right shoulder.  He also has been using his arms and thought it was related to that.  He is also had some mild left shoulder pain, which he believes is due to sleeping on his left shoulder for the last 3 days.  Pain is reproducible on palpation and with movement of his shoulders.  He strongly denies any chest pain, shortness of breath, palpitations, or other symptoms.  Denies any lower extremity swelling.  No fevers chills.  No joint warmth or swelling.  He does have a history of gout in these joints as well.  He is been taking his medications as prescribed.  Past Medical History:  Diagnosis Date  . Angina at rest [facility]) 07/08/2016  . CAD (coronary artery disease) 2010   3v CABG  . Chest pain at rest 07/08/2016  . CHF (congestive heart failure) ([place]) 01/26/2012  . CKD (chronic kidney disease) 02/19/2012  . COPD with chronic bronchitis ([place]) 08/26/2016  . Diabetes mellitus type 2, uncontrolled ([place]) 01/26/2012  . Elevated liver function tests 01/26/2012  . Gout 01/26/2012  . Hx of CABG 2010   RCA Stent 2008, CABG x 2 [facility] 2010  . Hypercholesterolemia 07/08/2016  . Hypertension 09/04/2014  . Ischemic cardiomyopathy 11/15/2014  . Kidney stone 10/25/2015  . Lipid disorder 01/26/2012  . NSTEMI (non-ST elevated myocardial infarction) ([place]) 07/08/2016  . ST elevation myocardial infarction (STEMI)  ([place]) 2010  . Unstable angina ([place]) 11/14/2014    Patient Active Problem List   Diagnosis Date Noted  . Acute on chronic diastolic CHF (congestive heart failure) ([place]) 07/16/2018  . Noncompliance by refusing intervention or support 04/06/2018  . Uncontrolled type 2 diabetes mellitus with hyperglycemia ([place]) 01/31/2018  . Elevated troponin 07/28/2017  . Hypertensive urgency 07/12/2017  . LBBB (left bundle branch block) 04/26/2017  . COPD with chronic bronchitis ([place]) 08/26/2016  . Kidney stone on right side 10/25/2015  . CKD (chronic kidney disease), stage III ([place]) 10/13/2015  . Gout 10/13/2015  . CAD Status post CABG 2 SVG to OM 1, LIMA to mid LAD: 10/13/2015  . Cardiomyopathy, ischemic, chronic systolic CHF 58/85/0277  . Essential hypertension 09/04/2014  . Benign prostatic hyperplasia with lower urinary tract symptoms 01/24/2013  . Peyronie's disease 12/18/2012  . Chronic joint pain 06/22/2012  . History of tobacco abuse 01/26/2012  . Inguinal hernia, right 01/26/2012  . Lipid disorder 01/26/2012    Past Surgical History:  Procedure Laterality Date  . BIV ICD INSERTION CRT-D N/A 10/21/2017   Procedure: BIV ICD INSERTION CRT-D;  Surgeon: Constance Haw, MD;  Location: [facility] CV LAB;  Service: Cardiovascular;  Laterality: N/A;  . CORONARY ARTERY BYPASS GRAFT  2010  . KNEE SURGERY    . RIGHT/LEFT HEART CATH AND CORONARY ANGIOGRAPHY N/A 04/15/2017   Procedure: Right/Left Heart Cath and Coronary Angiography;  Surgeon: Larey Dresser, MD;  Location: [facility]  INVASIVE CV LAB;  Service: Cardiovascular;  Laterality: N/A;  . ROTATOR CUFF REPAIR    . WRIST SURGERY          Home Medications    Prior to Admission medications   Medication Sig Start Date End Date Taking? Authorizing Provider  albuterol (PROVENTIL HFA;VENTOLIN HFA) 108 (90 Base) MCG/ACT inhaler Inhale 2 puffs into the lungs every 6 (six) hours as needed for wheezing or shortness of breath.    [provider]  albuterol (PROVENTIL) (2.5 MG/3ML) 0.083% nebulizer solution INHALE CONTENTS OF 1 VIAL VIA NEBULIZER EVERY 6 HOURS AS NEEDED FOR WHEEZING/SHORTNESS OF BREATH 05/09/18   Wendling, Crosby Oyster, DO  allopurinol (ZYLOPRIM) 100 MG tablet Take 1 tablet (100 mg total) by mouth daily. Take with 300 mg tab for total of 400 mg daily. Patient taking differently: Take 100 mg by mouth See admin instructions. Take one tablet (100 mg) by mouth daily with a 300 mg tablet for a total dose of 400 mg daily 03/09/18   Shelda Pal, DO  allopurinol (ZYLOPRIM) 300 MG tablet Take 300 mg by mouth See admin instructions. Take one tablet (300 mg) by mouth daily with a 100 mg tablet for a total dose of 400 mg daily    [provider]  aspirin EC 81 MG EC tablet Take 1 tablet (81 mg total) by mouth daily. 04/16/17   Clegg, Amy D, NP  Aspirin-Salicylamide-Caffeine (BC HEADACHE POWDER PO) Take 1 packet by mouth daily as needed (headache/pain).    [provider]  atorvastatin (LIPITOR) 80 MG tablet Take 1 tablet (80 mg total) by mouth daily. 08/26/16   Shelda Pal, DO  carvedilol (COREG) 12.5 MG tablet Take 1 tablet (12.5 mg total) by mouth 2 (two) times daily with a meal. 07/17/18   Danford, Suann Larry, MD  COLCRYS 0.6 MG tablet TAKE 1 TABLET BY MOUTH TWICE A DAY Patient taking differently: TAKE 1 TABLET BY MOUTH TWICE A DAY AS NEEDED FOR GOUT ATTACK 01/28/18   Nani Ravens, Crosby Oyster, DO  diclofenac sodium (VOLTAREN) 1 % GEL Apply 4 g topically 4 (four) times daily for 7 days. 10/09/18 10/16/18  Duffy Bruce, MD  digoxin (LANOXIN) 0.125 MG tablet Take 1 tablet (0.125 mg total) by mouth daily. 10/27/17   Clegg, Amy D, NP  diphenhydrAMINE (BENADRYL) 25 MG tablet Take 25 mg by mouth daily as needed for itching.    [provider]  empagliflozin (JARDIANCE) 10 MG TABS tablet Take 10 mg by mouth daily. 02/01/18   Shelda Pal, DO  glucose blood test strip Use  as instructed 08/26/16   Shelda Pal, DO  Investigational - Study Medication Take 1 tablet by mouth 2 (two) times daily. Study name: Galactic HF Study Additional study details: Omecamtiv Mecarbil or Placebo Patient taking differently: Take 1 tablet by mouth 2 (two) times daily. Study name: Galactic HF Study Additional study details: Omecamtiv Mecarbil or Placebo Managed by Dr. Aundra Dubin [facility] RN) 04/15/17   Larey Dresser, MD  Lancets (FREESTYLE) lancets Use to check sugars daily 08/26/16   Shelda Pal, DO  metFORMIN (GLUCOPHAGE) 500 MG tablet Take 1 tablet (500 mg total) by mouth 2 (two) times daily with a meal. 01/03/18   Wendling, Crosby Oyster, DO  orphenadrine (NORFLEX) 100 MG tablet Take 1 tablet (100 mg total) by mouth 2 (two) times daily. 02/19/18   Charlesetta Shanks, MD  oxyCODONE-acetaminophen (PERCOCET/ROXICET) 5-325 MG tablet Take 1-2 tablets by mouth every 6 (six)  hours as needed for moderate pain or severe pain. 10/09/18   Duffy Bruce, MD  sacubitril-valsartan (ENTRESTO) 49-51 MG Take 1 tablet by mouth 2 (two) times daily. 02/03/18   Camnitz, Ocie Doyne, MD  spironolactone (ALDACTONE) 25 MG tablet Take 25 mg by mouth daily.    [provider]  spironolactone (ALDACTONE) 25 MG tablet TAKE 0.5 TABLETS (12.5 MG TOTAL) BY MOUTH DAILY. 07/18/18   Clegg, Amy D, NP  spironolactone (ALDACTONE) 25 MG tablet Take 1 tablet (25 mg total) by mouth daily. 07/20/18   Shelda Pal, DO  torsemide (DEMADEX) 20 MG tablet Take 20 mg 2 (two) times daily by mouth.    [provider]  torsemide (DEMADEX) 20 MG tablet TAKE 2 TABLETS (40 MG TOTAL) BY MOUTH DAILY. 07/18/18   Shelda Pal, DO    Family History Family History  Problem Relation Age of Onset  . Hypertension Mother   . Diabetes Mother   . Hyperlipidemia Mother   . Kidney failure Mother   . Emphysema Father     Social History Social History   Tobacco Use  . Smoking status: Former  Smoker    Packs/day: 0.50    Years: 30.00    Pack years: 15.00    Last attempt to quit: 07/10/2016    Years since quitting: 2.2  . Smokeless tobacco: Never Used  Substance Use Topics  . Alcohol use: No  . Drug use: No     Allergies   Hydrocodone and Tramadol   Review of Systems Review of Systems  Constitutional: Negative for chills, fatigue and fever.  HENT: Negative for congestion and rhinorrhea.   Eyes: Negative for visual disturbance.  Respiratory: Negative for cough, shortness of breath and wheezing.   Cardiovascular: Negative for chest pain and leg swelling.  Gastrointestinal: Negative for abdominal pain, diarrhea, nausea and vomiting.  Genitourinary: Negative for dysuria and flank pain.  Musculoskeletal: Positive for arthralgias. Negative for neck pain and neck stiffness.  Skin: Negative for rash and wound.  Allergic/Immunologic: Negative for immunocompromised state.  Neurological: Negative for syncope, weakness, numbness and headaches.  Hematological: Does not bruise/bleed easily.  All other systems reviewed and are negative.    Physical Exam Updated Vital Signs BP (!) 156/100 (BP Location: Left Arm)   Pulse 75   Temp 97.8 F (36.6 C) (Oral)   Ht 5\' 10"  (1.778 m)   Wt 87.1 kg   SpO2 95%   BMI 27.55 kg/m   Physical Exam  Constitutional: He is oriented to person, place, and time. He appears well-developed and well-nourished. No distress.  HENT:  Head: Normocephalic and atraumatic.  Eyes: Conjunctivae are normal.  Neck: Neck supple.  Cardiovascular: Normal rate, regular rhythm and normal heart sounds. Exam reveals no friction rub.  No murmur heard. Pulmonary/Chest: Effort normal and breath sounds normal. No respiratory distress. He has no wheezes. He has no rales.  Abdominal: He exhibits no distension.  Musculoskeletal: He exhibits no edema.  Moderate tenderness over the right anterior glenohumeral rim, with tenderness upon flexion and internal rotation of  the arm.  No joint warmth or swelling.  Mild tenderness diffusely about the anterior left shoulder, with no deformity.  Shoulder range of motion is full.  Negative impingement test.  Normal internal and external rotation.  Neurological: He is alert and oriented to person, place, and time. He exhibits normal muscle tone.  Skin: Skin is warm. Capillary refill takes less than 2 seconds.  Psychiatric: He has a normal mood  and affect.  Nursing note and vitals reviewed.    ED Treatments / Results  Labs (all labs ordered are listed, but only abnormal results are displayed) Labs Reviewed - No data to display  EKG EKG Interpretation  Date/Time:  Sunday October 09 2018 04:00:53 EDT Ventricular Rate:  77 PR Interval:  174 QRS Duration: 174 QT Interval:  472 QTC Calculation: 534 R Axis:   -91 Text Interpretation:  Atrial-sensed ventricular-paced rhythm Abnormal ECG No significant change since last tracing Confirmed by ,  ([id number]) on 10/09/2018 4:07:31 AM   Radiology Dg Shoulder Right  Result Date: 10/09/2018 CLINICAL DATA:  Bilateral shoulder pain for 3 days. No injury. EXAM: RIGHT SHOULDER - 2+ VIEW COMPARISON:  None. FINDINGS: Postoperative changes with metallic anchor in the lateral right humeral head. Degenerative changes in the glenohumeral joint with joint space narrowing and small osteophyte formation. No evidence of acute fracture or dislocation. No focal bone lesion or bone destruction. Bone cortex appears intact. Soft tissues are unremarkable. IMPRESSION: Postoperative and degenerative changes in the right shoulder. No acute bony abnormalities. Electronically Signed   By: William  Stevens M.D.   On: 10/09/2018 04:17   Dg Shoulder Left  Result Date: 10/09/2018 CLINICAL DATA:  Bilateral shoulder pain for 3 days. No injury. EXAM: LEFT SHOULDER - 2+ VIEW COMPARISON:  None. FINDINGS: There is a generator pack for cardiac pacemaker projected over the left proximal humerus,  limiting the examination on some images. Degenerative changes in the left shoulder with joint space narrowing and hypertrophic changes involving the glenohumeral and acromioclavicular joints. No evidence of acute fracture or dislocation. No focal bone lesion or bone destruction. Bone cortex appears intact. Soft tissues are unremarkable. Coracoclavicular spaces maintained. IMPRESSION: Degenerative changes in the left shoulder. No acute bony abnormalities. Electronically Signed   By: William  Stevens M.D.   On: 10/09/2018 04:18    Procedures Procedures (including critical care time)  Medications Ordered in ED Medications  oxyCODONE-acetaminophen (PERCOCET/ROXICET) 5-325 MG per tablet 2 tablet (2 tablets Oral Given 10/09/18 0354)     Initial Impression / Assessment and Plan / ED Course  I have reviewed the triage vital signs and the nursing notes.  Pertinent labs & imaging results that were available during my care of the patient were reviewed by me and considered in my medical decision making (see chart for details).     62 year old male with extensive history as above here with right greater than left shoulder pain.  I suspect he likely has acute on chronic arthritis secondary to his rotator cuff injury on the right shoulder.  His pain in his left shoulder began after he had to change how he normally sleeps, which I suspect is exacerbating arthritis in the shoulder.  Less likely is gout, and he is already on medical optimization for this.  No fevers or signs of septic arthritis.  No appreciable joint effusions.  I do not suspect this is any kind of referred pain from a cardiac source and EKG is nonischemic with no chest pain, shortness of breath, or other symptoms.  Will treat topically with Voltaren, give a brief course of analgesics, and outpatient follow-up.  Final Clinical Impressions(s) / ED Diagnoses   Final diagnoses:  Chronic right shoulder pain  Acute pain of left shoulder    ED  Discharge Orders         Ordered    oxyCODONE-acetaminophen (PERCOCET/ROXICET) 5-325 MG tablet  Every 6 hours PRN     10 /13/19 0437  diclofenac sodium (VOLTAREN) 1 % GEL  4 times daily     10/09/18 0437           Duffy Bruce, MD 10/09/18 [id number]

## 2018-10-09 NOTE — ED Triage Notes (Signed)
Patient states that he has had pain to his right and left shoulder x 2 -3 days

## 2018-10-15 ENCOUNTER — Emergency Department (HOSPITAL_BASED_OUTPATIENT_CLINIC_OR_DEPARTMENT_OTHER)
Admission: EM | Admit: 2018-10-15 | Discharge: 2018-10-15 | Disposition: A | Discharge status: Home / Self Care | Payer: Medicare Other | Attending: Emergency Medicine | Admitting: Emergency Medicine

## 2018-10-15 ENCOUNTER — Encounter (HOSPITAL_BASED_OUTPATIENT_CLINIC_OR_DEPARTMENT_OTHER): Payer: Self-pay | Admitting: Emergency Medicine

## 2018-10-15 ENCOUNTER — Other Ambulatory Visit: Payer: Self-pay

## 2018-10-15 DIAGNOSIS — Z87891 Personal history of nicotine dependence: Secondary | ICD-10-CM | POA: Insufficient documentation

## 2018-10-15 DIAGNOSIS — G894 Chronic pain syndrome: Secondary | ICD-10-CM | POA: Diagnosis not present

## 2018-10-15 DIAGNOSIS — I2510 Atherosclerotic heart disease of native coronary artery without angina pectoris: Secondary | ICD-10-CM | POA: Diagnosis not present

## 2018-10-15 DIAGNOSIS — I130 Hypertensive heart and chronic kidney disease with heart failure and stage 1 through stage 4 chronic kidney disease, or unspecified chronic kidney disease: Secondary | ICD-10-CM | POA: Insufficient documentation

## 2018-10-15 DIAGNOSIS — N183 Chronic kidney disease, stage 3 (moderate): Secondary | ICD-10-CM | POA: Insufficient documentation

## 2018-10-15 DIAGNOSIS — Z951 Presence of aortocoronary bypass graft: Secondary | ICD-10-CM | POA: Diagnosis not present

## 2018-10-15 DIAGNOSIS — I5032 Chronic diastolic (congestive) heart failure: Secondary | ICD-10-CM | POA: Insufficient documentation

## 2018-10-15 DIAGNOSIS — Z7984 Long term (current) use of oral hypoglycemic drugs: Secondary | ICD-10-CM | POA: Diagnosis not present

## 2018-10-15 DIAGNOSIS — I252 Old myocardial infarction: Secondary | ICD-10-CM | POA: Diagnosis not present

## 2018-10-15 DIAGNOSIS — Z79899 Other long term (current) drug therapy: Secondary | ICD-10-CM | POA: Diagnosis not present

## 2018-10-15 DIAGNOSIS — E1122 Type 2 diabetes mellitus with diabetic chronic kidney disease: Secondary | ICD-10-CM | POA: Diagnosis not present

## 2018-10-15 DIAGNOSIS — Z7982 Long term (current) use of aspirin: Secondary | ICD-10-CM | POA: Insufficient documentation

## 2018-10-15 DIAGNOSIS — M791 Myalgia, unspecified site: Secondary | ICD-10-CM | POA: Diagnosis present

## 2018-10-15 DIAGNOSIS — J449 Chronic obstructive pulmonary disease, unspecified: Secondary | ICD-10-CM | POA: Insufficient documentation

## 2018-10-15 MED ORDER — MORPHINE SULFATE (PF) 4 MG/ML IV SOLN
4.0000 mg | Freq: Once | INTRAVENOUS | Status: AC
  Administered 2018-10-15: 4 mg via INTRAMUSCULAR
  Filled 2018-10-15: qty 1

## 2018-10-15 NOTE — Discharge Instructions (Signed)
Please see your doctor to be set up with pain management

## 2018-10-15 NOTE — ED Triage Notes (Signed)
Patient states that he has pain all over his body and feels like it is the arthritis " it is so bad in my body aint nobody be able to to anything about it"

## 2018-10-15 NOTE — ED Notes (Signed)
ED Provider at bedside. 

## 2018-10-15 NOTE — ED Provider Notes (Signed)
[facility] DEPARTMENT Provider Note   CSN: [id number] Arrival date & time: 10/15/18  1634     History   Chief Complaint Chief Complaint  Patient presents with  . Generalized Body Aches    HPI Patrick Stewart is a 62 y.o. male who presents with generalized pain. Extensive PMH as listed below but most notably severe CHF, CAD, CKD, COPD. He states he was here a couple days ago for "the same thing". At that time he was having pain mostly in his shoulders. Now he has pain in his shoulders, back, elbows, wrists, knees. The pain is not better with Percocet which he was prescribed. It is not better with Voltaren gel. He is unsure of what to do. He has had the pain for years. He has seen orthopedics and had injections and surgery on his R shoulder and knees. He does not go to pain management. No acute injuries or falls.  HPI  Past Medical History:  Diagnosis Date  . Angina at rest [facility]) 07/08/2016  . CAD (coronary artery disease) 2010   3v CABG  . Chest pain at rest 07/08/2016  . CHF (congestive heart failure) ([place]) 01/26/2012  . CKD (chronic kidney disease) 02/19/2012  . COPD with chronic bronchitis ([place]) 08/26/2016  . Diabetes mellitus type 2, uncontrolled ([place]) 01/26/2012  . Elevated liver function tests 01/26/2012  . Gout 01/26/2012  . Hx of CABG 2010   RCA Stent 2008, CABG x 2 [facility] 2010  . Hypercholesterolemia 07/08/2016  . Hypertension 09/04/2014  . Ischemic cardiomyopathy 11/15/2014  . Kidney stone 10/25/2015  . Lipid disorder 01/26/2012  . NSTEMI (non-ST elevated myocardial infarction) ([place]) 07/08/2016  . ST elevation myocardial infarction (STEMI) ([place]) 2010  . Unstable angina ([place]) 11/14/2014    Patient Active Problem List   Diagnosis Date Noted  . Acute on chronic diastolic CHF (congestive heart failure) ([place]) 07/16/2018  . Noncompliance by refusing intervention or support 04/06/2018  . Uncontrolled type 2 diabetes mellitus with hyperglycemia ([place])  01/31/2018  . Elevated troponin 07/28/2017  . Hypertensive urgency 07/12/2017  . LBBB (left bundle branch block) 04/26/2017  . COPD with chronic bronchitis ([place]) 08/26/2016  . Kidney stone on right side 10/25/2015  . CKD (chronic kidney disease), stage III ([place]) 10/13/2015  . Gout 10/13/2015  . CAD Status post CABG 2 SVG to OM 1, LIMA to mid LAD: 10/13/2015  . Cardiomyopathy, ischemic, chronic systolic CHF 23/76/2831  . Essential hypertension 09/04/2014  . Benign prostatic hyperplasia with lower urinary tract symptoms 01/24/2013  . Peyronie's disease 12/18/2012  . Chronic joint pain 06/22/2012  . History of tobacco abuse 01/26/2012  . Inguinal hernia, right 01/26/2012  . Lipid disorder 01/26/2012    Past Surgical History:  Procedure Laterality Date  . BIV ICD INSERTION CRT-D N/A 10/21/2017   Procedure: BIV ICD INSERTION CRT-D;  Surgeon: Constance Haw, MD;  Location: [facility];  Service: Cardiovascular;  Laterality: N/A;  . CORONARY ARTERY BYPASS GRAFT  2010  . KNEE SURGERY    . RIGHT/LEFT HEART CATH AND CORONARY ANGIOGRAPHY N/A 04/15/2017   Procedure: Right/Left Heart Cath and Coronary Angiography;  Surgeon: Larey Dresser, MD;  Location: [facility];  Service: Cardiovascular;  Laterality: N/A;  . ROTATOR CUFF REPAIR    . WRIST SURGERY          Home Medications    Prior to Admission medications   Medication Sig Start Date End Date Taking? Authorizing Provider  albuterol (PROVENTIL  HFA;VENTOLIN HFA) 108 (90 Base) MCG/ACT inhaler Inhale 2 puffs into the lungs every 6 (six) hours as needed for wheezing or shortness of breath.    [provider]  albuterol (PROVENTIL) (2.5 MG/3ML) 0.083% nebulizer solution INHALE CONTENTS OF 1 VIAL VIA NEBULIZER EVERY 6 HOURS AS NEEDED FOR WHEEZING/SHORTNESS OF BREATH 05/09/18   Wendling, Crosby Oyster, DO  allopurinol (ZYLOPRIM) 100 MG tablet Take 1 tablet (100 mg total) by mouth daily. Take with 300 mg tab for  total of 400 mg daily. Patient taking differently: Take 100 mg by mouth See admin instructions. Take one tablet (100 mg) by mouth daily with a 300 mg tablet for a total dose of 400 mg daily 03/09/18   Shelda Pal, DO  allopurinol (ZYLOPRIM) 300 MG tablet Take 300 mg by mouth See admin instructions. Take one tablet (300 mg) by mouth daily with a 100 mg tablet for a total dose of 400 mg daily    [provider]  aspirin EC 81 MG EC tablet Take 1 tablet (81 mg total) by mouth daily. 04/16/17   Clegg, Amy D, NP  Aspirin-Salicylamide-Caffeine (BC HEADACHE POWDER PO) Take 1 packet by mouth daily as needed (headache/pain).    [provider]  atorvastatin (LIPITOR) 80 MG tablet Take 1 tablet (80 mg total) by mouth daily. 08/26/16   Shelda Pal, DO  carvedilol (COREG) 12.5 MG tablet Take 1 tablet (12.5 mg total) by mouth 2 (two) times daily with a meal. 07/17/18   Danford, Suann Larry, MD  COLCRYS 0.6 MG tablet TAKE 1 TABLET BY MOUTH TWICE A DAY Patient taking differently: TAKE 1 TABLET BY MOUTH TWICE A DAY AS NEEDED FOR GOUT ATTACK 01/28/18   Nani Ravens, Crosby Oyster, DO  diclofenac sodium (VOLTAREN) 1 % GEL Apply 4 g topically 4 (four) times daily for 7 days. 10/09/18 10/16/18  Duffy Bruce, MD  digoxin (LANOXIN) 0.125 MG tablet Take 1 tablet (0.125 mg total) by mouth daily. 10/27/17   Clegg, Amy D, NP  diphenhydrAMINE (BENADRYL) 25 MG tablet Take 25 mg by mouth daily as needed for itching.    [provider]  empagliflozin (JARDIANCE) 10 MG TABS tablet Take 10 mg by mouth daily. 02/01/18   Shelda Pal, DO  glucose blood test strip Use as instructed 08/26/16   Shelda Pal, DO  Investigational - Study Medication Take 1 tablet by mouth 2 (two) times daily. Study name: Galactic HF Study Additional study details: Omecamtiv Mecarbil or Placebo Patient taking differently: Take 1 tablet by mouth 2 (two) times daily. Study name: Galactic HF  Study Additional study details: Omecamtiv Mecarbil or Placebo Managed by Dr. Aundra Dubin [facility] RN) 04/15/17   Larey Dresser, MD  Lancets (FREESTYLE) lancets Use to check sugars daily 08/26/16   Shelda Pal, DO  metFORMIN (GLUCOPHAGE) 500 MG tablet Take 1 tablet (500 mg total) by mouth 2 (two) times daily with a meal. 01/03/18   Wendling, Crosby Oyster, DO  orphenadrine (NORFLEX) 100 MG tablet Take 1 tablet (100 mg total) by mouth 2 (two) times daily. 02/19/18   Charlesetta Shanks, MD  oxyCODONE-acetaminophen (PERCOCET/ROXICET) 5-325 MG tablet Take 1-2 tablets by mouth every 6 (six) hours as needed for moderate pain or severe pain. 10/09/18   Duffy Bruce, MD  sacubitril-valsartan (ENTRESTO) 49-51 MG Take 1 tablet by mouth 2 (two) times daily. 02/03/18   Camnitz, Ocie Doyne, MD  spironolactone (ALDACTONE) 25 MG tablet Take 25 mg by mouth daily.    [provider]  spironolactone (ALDACTONE) 25 MG tablet TAKE 0.5 TABLETS (12.5 MG TOTAL) BY MOUTH DAILY. 07/18/18   Clegg, Amy D, NP  spironolactone (ALDACTONE) 25 MG tablet Take 1 tablet (25 mg total) by mouth daily. 07/20/18   Shelda Pal, DO  torsemide (DEMADEX) 20 MG tablet Take 20 mg 2 (two) times daily by mouth.    [provider]  torsemide (DEMADEX) 20 MG tablet TAKE 2 TABLETS (40 MG TOTAL) BY MOUTH DAILY. 07/18/18   Shelda Pal, DO    Family History Family History  Problem Relation Age of Onset  . Hypertension Mother   . Diabetes Mother   . Hyperlipidemia Mother   . Kidney failure Mother   . Emphysema Father     Social History Social History   Tobacco Use  . Smoking status: Former Smoker    Packs/day: 0.50    Years: 30.00    Pack years: 15.00    Last attempt to quit: 07/10/2016    Years since quitting: 2.2  . Smokeless tobacco: Never Used  Substance Use Topics  . Alcohol use: No  . Drug use: No     Allergies   Hydrocodone and Tramadol   Review of Systems Review of Systems    Musculoskeletal: Positive for arthralgias and back pain.  Neurological: Negative for weakness and numbness.     Physical Exam Updated Vital Signs BP (!) 166/108 (BP Location: Right Arm)   Pulse 100   Temp 98.5 F (36.9 C) (Oral)   Resp 20   Ht 5\' 10"  (1.778 m)   Wt 87.1 kg   SpO2 98%   BMI 27.55 kg/m   Physical Exam  Constitutional: He is oriented to person, place, and time. He appears well-developed and well-nourished. No distress.  Chronically ill appearing  HENT:  Head: Normocephalic and atraumatic.  Eyes: Pupils are equal, round, and reactive to light. Conjunctivae are normal. Right eye exhibits no discharge. Left eye exhibits no discharge. No scleral icterus.  Neck: Normal range of motion.  Cardiovascular: Normal rate.  Pulmonary/Chest: Effort normal. No respiratory distress.  Abdominal: He exhibits no distension.  Musculoskeletal:  Right shoulder: Surgical scar. Decreased ROM. No focal tenderness. 2+ radial pulse.  Right elbow: Mild swelling over olecranon. Decreased ROM. Mild tenderness Right wrist: No significant swelling or redness. No focal tenderness  Left shoulder: Decreased ROM. No focal tenderness. 2+ radial pulse.  Left elbow: Normal ROM. Mild tenderness Left wrist: No significant swelling or redness. No focal tenderness  Back: No tenderness to palpation. Ambulatory  Right and left knees: Surgical scars from arthroscopy. No focal tenderness. Decreased ROM bilaterally. Ambulatory  Neurological: He is alert and oriented to person, place, and time.  Skin: Skin is warm and dry.  Psychiatric: He has a normal mood and affect. His behavior is normal.  Nursing note and vitals reviewed.    ED Treatments / Results  Labs (all labs ordered are listed, but only abnormal results are displayed) Labs Reviewed - No data to display  EKG None  Radiology No results found.  Procedures Procedures (including critical care time)  Medications Ordered in  ED Medications - No data to display   Initial Impression / Assessment and Plan / ED Course  I have reviewed the triage vital signs and the nursing notes.  Pertinent labs & imaging results that were available during my care of the patient were reviewed by me and considered in my medical decision making (see chart for details).  62 year  old male presents with acute on chronic pain. He is hypertensive and has mild tachycardia but appears in no distress. His complaint is intractable pain. He is not getting adequate relief with Percocet and Toradol. He is asking for IV medication which I declined but will give IM. I do not feel comfortable refilling Percocet. He was encouraged to follow up with his doctor and establishing care with pain management.  Final Clinical Impressions(s) / ED Diagnoses   Final diagnoses:  Chronic pain syndrome    ED Discharge Orders    None       Recardo Evangelist, PA-C 10/15/18 1724    Elnora Morrison, MD 10/15/18 2227

## 2018-10-17 ENCOUNTER — Telehealth: Payer: Self-pay | Admitting: *Deleted

## 2018-10-17 ENCOUNTER — Encounter: Payer: Self-pay | Admitting: Family Medicine

## 2018-10-17 ENCOUNTER — Ambulatory Visit (INDEPENDENT_AMBULATORY_CARE_PROVIDER_SITE_OTHER): Payer: Medicare Other | Admitting: Family Medicine

## 2018-10-17 VITALS — BP 140/84 | HR 93 | Temp 97.7°F | Ht 70.0 in | Wt 187.1 lb

## 2018-10-17 DIAGNOSIS — E1165 Type 2 diabetes mellitus with hyperglycemia: Secondary | ICD-10-CM

## 2018-10-17 DIAGNOSIS — M1A0620 Idiopathic chronic gout, left knee, without tophus (tophi): Secondary | ICD-10-CM | POA: Diagnosis not present

## 2018-10-17 LAB — COMPREHENSIVE METABOLIC PANEL
ALT: 14 U/L (ref 0–53)
AST: 33 U/L (ref 0–37)
Albumin: 3.5 g/dL (ref 3.5–5.2)
Alkaline Phosphatase: 75 U/L (ref 39–117)
BUN: 32 mg/dL — AB (ref 6–23)
CHLORIDE: 95 meq/L — AB (ref 96–112)
CO2: 26 meq/L (ref 19–32)
Calcium: 9.1 mg/dL (ref 8.4–10.5)
Creatinine, Ser: 2.01 mg/dL — ABNORMAL HIGH (ref 0.40–1.50)
GFR: 43.36 mL/min — ABNORMAL LOW (ref >60.00)
GLUCOSE: 139 mg/dL — AB (ref 70–99)
POTASSIUM: 3.7 meq/L (ref 3.5–5.1)
SODIUM: 135 meq/L (ref 135–145)
Total Bilirubin: 1.3 mg/dL — ABNORMAL HIGH (ref 0.2–1.2)
Total Protein: 7.9 g/dL (ref 6.0–8.3)

## 2018-10-17 LAB — URIC ACID: URIC ACID, SERUM: 13.2 mg/dL — AB (ref 4.0–7.8)

## 2018-10-17 LAB — HEMOGLOBIN A1C: Hgb A1c MFr Bld: 8.3 % — ABNORMAL HIGH (ref 4.6–6.5)

## 2018-10-17 MED ORDER — METHYLPREDNISOLONE ACETATE 80 MG/ML IJ SUSP
80.0000 mg | Freq: Once | INTRAMUSCULAR | Status: AC
  Administered 2018-10-17: 80 mg via INTRAMUSCULAR

## 2018-10-17 MED ORDER — PREDNISONE 20 MG PO TABS: 40.0000 mg | ORAL_TABLET | Freq: Every day | ORAL | 0 refills | Status: AC

## 2018-10-17 NOTE — Progress Notes (Signed)
Chief Complaint  Patient presents with  . Follow-up    arthritis    Subjective: Patient is a 62 y.o. male here for ER f/u.  Hx of gout, presents with swelling and pain of b/l elbows, worse on R, and L knee pain. Seen in ED without much relief. Has not been compliant w f/u recs. Is monitoring diet and knows which foods to avoid for gout. No fevers, inj or change in activity.  ROS: MSK: +b/l elbow pain and L knee pain  Past Medical History:  Diagnosis Date  . Angina at rest [facility]) 07/08/2016  . CAD (coronary artery disease) 2010   3v CABG  . Chest pain at rest 07/08/2016  . CHF (congestive heart failure) ([place]) 01/26/2012  . CKD (chronic kidney disease) 02/19/2012  . COPD with chronic bronchitis ([place]) 08/26/2016  . Diabetes mellitus type 2, uncontrolled ([place]) 01/26/2012  . Elevated liver function tests 01/26/2012  . Gout 01/26/2012  . Hx of CABG 2010   RCA Stent 2008, CABG x 2 [facility] 2010  . Hypercholesterolemia 07/08/2016  . Hypertension 09/04/2014  . Ischemic cardiomyopathy 11/15/2014  . Kidney stone 10/25/2015  . Lipid disorder 01/26/2012  . NSTEMI (non-ST elevated myocardial infarction) ([place]) 07/08/2016  . ST elevation myocardial infarction (STEMI) ([place]) 2010  . Unstable angina ([facility]) 11/14/2014    Objective: BP 140/84 (BP Location: Left Arm, Patient Position: Sitting, Cuff Size: Normal)   Pulse 93   Temp 97.7 F (36.5 C) (Oral)   Ht 5\' 10"  (1.778 m)   Wt 187 lb 2 oz (84.9 kg)   SpO2 95%   BMI 26.85 kg/m  General: Awake, appears stated age MSK: Prominent olecranon bursa on R with no fluctuance, erythema, excessive warmth, drainage, milder exam on L; knee exam on L unremarkable with good structural exam Lungs: No accessory muscle use Psych: Age appropriate judgment and insight, normal affect and mood  Assessment and Plan: Chronic gout of left knee, unspecified cause - Plan: predniSONE (DELTASONE) 20 MG tablet, Uric acid, methylPREDNISolone acetate (DEPO-MEDROL)  injection 80 mg  Uncontrolled type 2 diabetes mellitus with hyperglycemia ([facility]) - Plan: Comprehensive metabolic panel, Hemoglobin A1c  Ck urate, steroid shot today. Start oral steroids tomorrow. May need to tailor allopurinol further if renal function allows. F/u in 3-4 weeks. Emphasized importance of f/u while we try to manage his chronic issues.  The patient voiced understanding and agreement to the plan.  [place], DO 10/17/18  12:06 PM

## 2018-10-17 NOTE — Telephone Encounter (Signed)
Calling patient to schedule an appointment for the [facility] HF trial.

## 2018-10-17 NOTE — Progress Notes (Signed)
Pre visit review using our clinic review tool, if applicable. No additional management support is needed unless otherwise documented below in the visit note. 

## 2018-10-17 NOTE — Patient Instructions (Signed)
Ice/cold pack over area for 10-15 min twice daily.  Start the prednisone tomorrow.  Give [place] 2-3 business days to get the results of your labs back.   Let us know if you need anything.

## 2018-10-18 ENCOUNTER — Encounter: Payer: Self-pay | Admitting: Family Medicine

## 2018-10-18 DIAGNOSIS — Z9119 Patient's noncompliance with other medical treatment and regimen: Secondary | ICD-10-CM

## 2018-10-18 DIAGNOSIS — Z91199 Patient's noncompliance with other medical treatment and regimen due to unspecified reason: Secondary | ICD-10-CM

## 2018-10-18 HISTORY — DX: Patient's noncompliance with other medical treatment and regimen: Z91.19

## 2018-10-18 HISTORY — DX: Patient's noncompliance with other medical treatment and regimen due to unspecified reason: Z91.199

## 2018-10-19 ENCOUNTER — Other Ambulatory Visit (HOSPITAL_COMMUNITY): Payer: Self-pay | Admitting: Adult Health

## 2018-10-23 ENCOUNTER — Emergency Department (HOSPITAL_COMMUNITY): Payer: Medicare Other

## 2018-10-23 ENCOUNTER — Encounter (HOSPITAL_COMMUNITY): Payer: Self-pay | Admitting: Emergency Medicine

## 2018-10-23 ENCOUNTER — Emergency Department (HOSPITAL_COMMUNITY)
Admission: EM | Admit: 2018-10-23 | Discharge: 2018-10-23 | Disposition: A | Discharge status: Home / Self Care | Payer: Medicare Other | Attending: Emergency Medicine | Admitting: Emergency Medicine

## 2018-10-23 DIAGNOSIS — I509 Heart failure, unspecified: Secondary | ICD-10-CM | POA: Insufficient documentation

## 2018-10-23 DIAGNOSIS — E119 Type 2 diabetes mellitus without complications: Secondary | ICD-10-CM | POA: Diagnosis not present

## 2018-10-23 DIAGNOSIS — N183 Chronic kidney disease, stage 3 (moderate): Secondary | ICD-10-CM | POA: Diagnosis not present

## 2018-10-23 DIAGNOSIS — Z79899 Other long term (current) drug therapy: Secondary | ICD-10-CM | POA: Diagnosis not present

## 2018-10-23 DIAGNOSIS — Z7984 Long term (current) use of oral hypoglycemic drugs: Secondary | ICD-10-CM | POA: Diagnosis not present

## 2018-10-23 DIAGNOSIS — Z87891 Personal history of nicotine dependence: Secondary | ICD-10-CM | POA: Insufficient documentation

## 2018-10-23 DIAGNOSIS — I130 Hypertensive heart and chronic kidney disease with heart failure and stage 1 through stage 4 chronic kidney disease, or unspecified chronic kidney disease: Secondary | ICD-10-CM | POA: Insufficient documentation

## 2018-10-23 DIAGNOSIS — J449 Chronic obstructive pulmonary disease, unspecified: Secondary | ICD-10-CM | POA: Insufficient documentation

## 2018-10-23 DIAGNOSIS — R079 Chest pain, unspecified: Secondary | ICD-10-CM | POA: Diagnosis not present

## 2018-10-23 DIAGNOSIS — Z7982 Long term (current) use of aspirin: Secondary | ICD-10-CM | POA: Insufficient documentation

## 2018-10-23 DIAGNOSIS — I2510 Atherosclerotic heart disease of native coronary artery without angina pectoris: Secondary | ICD-10-CM | POA: Insufficient documentation

## 2018-10-23 DIAGNOSIS — R0789 Other chest pain: Secondary | ICD-10-CM

## 2018-10-23 LAB — CBC
HEMATOCRIT: 39.7 % (ref 39.0–52.0)
Hemoglobin: 12.6 g/dL — ABNORMAL LOW (ref 13.0–17.0)
MCH: 30.3 pg (ref 26.0–34.0)
MCHC: 31.7 g/dL (ref 30.0–36.0)
MCV: 95.4 fL (ref 80.0–100.0)
PLATELETS: 284 10*3/uL (ref 150–400)
RBC: 4.16 MIL/uL — ABNORMAL LOW (ref 4.22–5.81)
RDW: 14.4 % (ref 11.5–15.5)
WBC: 5.7 10*3/uL (ref 4.0–10.5)
nRBC: 0 % (ref 0.0–0.2)

## 2018-10-23 LAB — I-STAT TROPONIN, ED
TROPONIN I, POC: 0.03 ng/mL (ref 0.00–0.08)
Troponin i, poc: 0.03 ng/mL (ref 0.00–0.08)

## 2018-10-23 LAB — BASIC METABOLIC PANEL
Anion gap: 12 (ref 5–15)
BUN: 26 mg/dL — AB (ref 8–23)
CHLORIDE: 99 mmol/L (ref 98–111)
CO2: 25 mmol/L (ref 22–32)
Calcium: 9.3 mg/dL (ref 8.9–10.3)
Creatinine, Ser: 1.36 mg/dL — ABNORMAL HIGH (ref 0.61–1.24)
GFR calc Af Amer: >60 mL/min (ref >60)
GFR calc non Af Amer: 54 mL/min — ABNORMAL LOW (ref >60)
GLUCOSE: 174 mg/dL — AB (ref 70–99)
POTASSIUM: 3.8 mmol/L (ref 3.5–5.1)
Sodium: 136 mmol/L (ref 135–145)

## 2018-10-23 MED ORDER — LIDOCAINE 1.8 % EX PTCH: 1.0000 | MEDICATED_PATCH | Freq: Every day | CUTANEOUS | 0 refills | Status: DC | PRN

## 2018-10-23 MED ORDER — LIDOCAINE 5 % EX PTCH
1.0000 | MEDICATED_PATCH | Freq: Once | CUTANEOUS | Status: DC
  Administered 2018-10-23: 1 via TRANSDERMAL
  Filled 2018-10-23: qty 1

## 2018-10-23 NOTE — ED Provider Notes (Signed)
[facility] EMERGENCY DEPARTMENT Provider Note   CSN: [id number] Arrival date & time: 10/23/18  3716     History   Chief Complaint Chief Complaint  Patient presents with  . Chest Pain    HPI Patrick Stewart is a 62 y.o. male with history of CAD status post CABG, ischemic cardiomyopathy status post ICD, CHF, CKD, COPD, diabetes mellitus, gout, hypertension, hyperlipidemia, medication noncompliance presents for evaluation of acute onset, constant right-sided chest pain beginning at around 3 AM this morning.  He states the pain is sharp, worsens with upward movement of the right upper extremity, and palpation.  Radiates across the chest.  He denies any shortness of breath, diaphoresis, nausea, vomiting, or lightheadedness.  He denies abdominal pain.  States his leg swelling is at his baseline and has not worsened.  Has not tried anything for his symptoms.  He is a non-smoker, quit 1 year ago.  States he has not had his home medicines this morning.  The history is provided by the patient.    Past Medical History:  Diagnosis Date  . Angina at rest [facility]) 07/08/2016  . CAD (coronary artery disease) 2010   3v CABG  . Chest pain at rest 07/08/2016  . CHF (congestive heart failure) ([place]) 01/26/2012  . CKD (chronic kidney disease) 02/19/2012  . COPD with chronic bronchitis ([place]) 08/26/2016  . Diabetes mellitus type 2, uncontrolled ([place]) 01/26/2012  . Elevated liver function tests 01/26/2012  . Gout 01/26/2012  . Hx of CABG 2010   RCA Stent 2008, CABG x 2 [facility] 2010  . Hypercholesterolemia 07/08/2016  . Hypertension 09/04/2014  . Ischemic cardiomyopathy 11/15/2014  . Kidney stone 10/25/2015  . Lipid disorder 01/26/2012  . Noncompliance 10/18/2018  . NSTEMI (non-ST elevated myocardial infarction) ([place]) 07/08/2016  . ST elevation myocardial infarction (STEMI) ([place]) 2010  . Unstable angina ([place]) 11/14/2014    Patient Active Problem List   Diagnosis Date Noted  .  Noncompliance 10/18/2018  . Acute on chronic diastolic CHF (congestive heart failure) ([place]) 07/16/2018  . Noncompliance by refusing intervention or support 04/06/2018  . Uncontrolled type 2 diabetes mellitus with hyperglycemia ([place]) 01/31/2018  . Hypertensive urgency 07/12/2017  . LBBB (left bundle branch block) 04/26/2017  . COPD with chronic bronchitis ([place]) 08/26/2016  . Kidney stone on right side 10/25/2015  . CKD (chronic kidney disease), stage III ([place]) 10/13/2015  . Gout 10/13/2015  . CAD Status post CABG 2 SVG to OM 1, LIMA to mid LAD: 10/13/2015  . Cardiomyopathy, ischemic, chronic systolic CHF 96/[id number]/9381  . Essential hypertension 09/04/2014  . Benign prostatic hyperplasia with lower urinary tract symptoms 01/24/2013  . Peyronie's disease 12/18/2012  . Chronic joint pain 06/22/2012  . History of tobacco abuse 01/26/2012  . Inguinal hernia, right 01/26/2012    Past Surgical History:  Procedure Laterality Date  . BIV ICD INSERTION CRT-D N/A 10/21/2017   Procedure: BIV ICD INSERTION CRT-D;  Surgeon: Constance Haw, MD;  Location: [facility] CV [facility];  Service: Cardiovascular;  Laterality: N/A;  . CORONARY ARTERY BYPASS GRAFT  2010  . KNEE SURGERY    . RIGHT/LEFT HEART CATH AND CORONARY ANGIOGRAPHY N/A 04/15/2017   Procedure: Right/Left Heart Cath and Coronary Angiography;  Surgeon: Larey Dresser, MD;  Location: [facility];  Service: Cardiovascular;  Laterality: N/A;  . ROTATOR CUFF REPAIR    . WRIST SURGERY          Home Medications    Prior to Admission medications  Medication Sig Start Date End Date Taking? Authorizing Provider  albuterol (PROVENTIL HFA;VENTOLIN HFA) 108 (90 Base) MCG/ACT inhaler Inhale 2 puffs into the lungs every 6 (six) hours as needed for wheezing or shortness of breath.    [provider]  albuterol (PROVENTIL) (2.5 MG/3ML) 0.083% nebulizer solution INHALE CONTENTS OF 1 VIAL VIA NEBULIZER EVERY 6 HOURS AS NEEDED FOR  WHEEZING/SHORTNESS OF BREATH 05/09/18   Wendling, Crosby Oyster, DO  allopurinol (ZYLOPRIM) 100 MG tablet Take 1 tablet (100 mg total) by mouth daily. Take with 300 mg tab for total of 400 mg daily. Patient taking differently: Take 100 mg by mouth See admin instructions. Take one tablet (100 mg) by mouth daily with a 300 mg tablet for a total dose of 400 mg daily 03/09/18   Shelda Pal, DO  allopurinol (ZYLOPRIM) 300 MG tablet Take 300 mg by mouth See admin instructions. Take one tablet (300 mg) by mouth daily with a 100 mg tablet for a total dose of 400 mg daily    [provider]  aspirin EC 81 MG EC tablet Take 1 tablet (81 mg total) by mouth daily. 04/16/17   Clegg, Amy D, NP  Aspirin-Salicylamide-Caffeine (BC HEADACHE POWDER PO) Take 1 packet by mouth daily as needed (headache/pain).    [provider]  atorvastatin (LIPITOR) 80 MG tablet Take 1 tablet (80 mg total) by mouth daily. 08/26/16   Shelda Pal, DO  carvedilol (COREG) 12.5 MG tablet Take 1 tablet (12.5 mg total) by mouth 2 (two) times daily with a meal. 07/17/18   Danford, Suann Larry, MD  COLCRYS 0.6 MG tablet TAKE 1 TABLET BY MOUTH TWICE A DAY Patient taking differently: TAKE 1 TABLET BY MOUTH TWICE A DAY AS NEEDED FOR GOUT ATTACK 01/28/18   Wendling, Crosby Oyster, DO  digoxin (LANOXIN) 0.125 MG tablet TAKE 1 TABLET BY MOUTH EVERY DAY 10/19/18   Clegg, Amy D, NP  diphenhydrAMINE (BENADRYL) 25 MG tablet Take 25 mg by mouth daily as needed for itching.    [provider]  empagliflozin (JARDIANCE) 10 MG TABS tablet Take 10 mg by mouth daily. 02/01/18   Shelda Pal, DO  glucose blood test strip Use as instructed 08/26/16   Shelda Pal, DO  Investigational - Study Medication Take 1 tablet by mouth 2 (two) times daily. Study name: Galactic HF Study Additional study details: Omecamtiv Mecarbil or Placebo Patient taking differently: Take 1 tablet by mouth 2 (two) times  daily. Study name: Galactic HF Study Additional study details: Omecamtiv Mecarbil or Placebo Managed by Dr. Aundra [facility] RN) 04/15/17   Larey Dresser, MD  Lancets (FREESTYLE) lancets Use to check sugars daily 08/26/16   Shelda Pal, DO  Lidocaine 1.8 % PTCH Apply 1 patch topically daily as needed (pain). 10/23/18   Nils Flack, Lucille Crichlow A, PA-C  metFORMIN (GLUCOPHAGE) 500 MG tablet Take 1 tablet (500 mg total) by mouth 2 (two) times daily with a meal. 01/03/18   Wendling, Crosby Oyster, DO  orphenadrine (NORFLEX) 100 MG tablet Take 1 tablet (100 mg total) by mouth 2 (two) times daily. 02/19/18   Charlesetta Shanks, MD  oxyCODONE-acetaminophen (PERCOCET/ROXICET) 5-325 MG tablet Take 1-2 tablets by mouth every 6 (six) hours as needed for moderate pain or severe pain. 10/09/18   Duffy Bruce, MD  predniSONE (DELTASONE) 20 MG tablet Take 2 tablets (40 mg total) by mouth daily with breakfast for 5 days. 10/18/18 10/23/18  Shelda Pal, DO  sacubitril-valsartan (ENTRESTO) [phone]  MG Take 1 tablet by mouth 2 (two) times daily. 02/03/18   Camnitz, Ocie Doyne, MD  spironolactone (ALDACTONE) 25 MG tablet Take 25 mg by mouth daily.    [provider]  spironolactone (ALDACTONE) 25 MG tablet TAKE 0.5 TABLETS (12.5 MG TOTAL) BY MOUTH DAILY. 07/18/18   Clegg, Amy D, NP  spironolactone (ALDACTONE) 25 MG tablet Take 1 tablet (25 mg total) by mouth daily. 07/20/18   Shelda Pal, DO  torsemide (DEMADEX) 20 MG tablet Take 20 mg 2 (two) times daily by mouth.    [provider]  torsemide (DEMADEX) 20 MG tablet TAKE 2 TABLETS (40 MG TOTAL) BY MOUTH DAILY. 07/18/18   Shelda Pal, DO    Family History Family History  Problem Relation Age of Onset  . Hypertension Mother   . Diabetes Mother   . Hyperlipidemia Mother   . Kidney failure Mother   . Emphysema Father     Social History Social History   Tobacco Use  . Smoking status: Former Smoker    Packs/day: 0.50      Years: 30.00    Pack years: 15.00    Last attempt to quit: 07/10/2016    Years since quitting: 2.2  . Smokeless tobacco: Never Used  Substance Use Topics  . Alcohol use: No  . Drug use: No     Allergies   Hydrocodone and Tramadol   Review of Systems Review of Systems  Constitutional: Negative for chills and fever.  Respiratory: Negative for shortness of breath.   Cardiovascular: Positive for chest pain. Negative for leg swelling (Chronic, unchanged).  Gastrointestinal: Negative for abdominal pain, nausea and vomiting.     Physical Exam Updated Vital Signs BP (!) 152/89   Pulse 80   Temp 97.9 F (36.6 C) (Oral)   Resp (!) 21   SpO2 95%   Physical Exam  Constitutional: He appears well-developed and well-nourished. No distress.  HENT:  Head: Normocephalic and atraumatic.  Eyes: Conjunctivae are normal. Right eye exhibits no discharge. Left eye exhibits no discharge.  Neck: No JVD present. No tracheal deviation present.  Cardiovascular: Normal rate and regular rhythm.  Murmur heard. 2+ radial and DP/PT pulses bilaterally, Homans sign absent bilaterally, compartments are soft.  Pulmonary/Chest: Effort normal.  Globally diminished breath sounds, equal rise and fall of chest, no increased work of breathing.  Speaking in full sentences without difficulty.  Tenderness to palpation of the right anterior, lateral, and posterior chest wall with no deformity, crepitus, ecchymosis, or flail segment.  Abdominal: Soft. Bowel sounds are normal. He exhibits no distension. There is no tenderness.  Musculoskeletal:       Right lower leg: He exhibits edema.       Left lower leg: He exhibits edema.  Trace pitting edema of the bilateral lower extremities  Neurological: He is alert.  Skin: Skin is warm and dry. No erythema.  Psychiatric: He has a normal mood and affect. His behavior is normal.  Nursing note and vitals reviewed.    ED Treatments / Results  Labs (all labs ordered  are listed, but only abnormal results are displayed) Labs Reviewed  BASIC METABOLIC PANEL - Abnormal; Notable for the following components:      Result Value   Glucose, Bld 174 (*)    BUN 26 (*)    Creatinine, Ser 1.36 (*)    GFR calc non Af Amer 54 (*)    All other components within normal limits  CBC - Abnormal; Notable  for the following components:   RBC 4.16 (*)    Hemoglobin 12.6 (*)    All other components within normal limits  I-STAT TROPONIN, ED  I-STAT TROPONIN, ED    EKG EKG Interpretation  Date/Time:  Sunday October 23 2018 06:50:55 EDT Ventricular Rate:  92 PR Interval:  172 QRS Duration: 176 QT Interval:  418 QTC Calculation: 516 R Axis:   -54 Text Interpretation:  Atrial-sensed ventricular-paced rhythm Abnormal ECG No significant change since last tracing Confirmed by Floyd, Dan ([id number]) on 10/23/2018 10:44:35 AM   Radiology Dg Chest 2 View  Result Date: 10/23/2018 CLINICAL DATA:  62 year old male with chest pain onset this morning. EXAM: CHEST - 2 VIEW COMPARISON:  07/15/2018 and earlier. FINDINGS: Stable cardiomegaly and mediastinal contours. Stable left chest AICD. Prior CABG. Stable lung volumes. Visualized tracheal air column is within normal limits. No pneumothorax, pulmonary edema, pleural effusion or confluent pulmonary opacity. No acute osseous abnormality identified. Negative visible bowel gas pattern. IMPRESSION: Stable cardiomegaly. No acute cardiopulmonary abnormality. Electronically Signed   By: H  Hall M.D.   On: 10/23/2018 07:57    Procedures Procedures (including critical care time)  Medications Ordered in ED Medications  lidocaine (LIDODERM) 5 % 1 patch (1 patch Transdermal Patch Applied 10/23/18 1000)     Initial Impression / Assessment and Plan / ED Course  I have reviewed the triage vital signs and the nursing notes.  Pertinent labs & imaging results that were available during my care of the patient were reviewed by me and considered  in my medical decision making (see chart for details).     Patient presents for evaluation of right-sided chest pain reproducible on palpation.  He is afebrile, hypertensive in the ED though this appears to be his baseline.  He is nontoxic in appearance.  Pain is not pleuritic or exertional.  Lab work shows no leukocytosis, mild anemia.  His creatinine is mildly elevated though it is improved as compared to most recent lab work obtained prior to this.  Chest x-ray shows stable cardiomegaly, no acute abnormalities.  EKG shows no significant changes from last tracing, no new ischemic changes.  Serial troponins are negative.  Doubt ACS/MI and presentation does not appear to be consistent with cardiac etiology.  He denies any shortness of breath, doubt PE, pneumonia, pleural effusion, pericarditis, myocarditis.  Doubt esophageal rupture, cardiac tamponade, or tension pneumothorax.  He was given a lidocaine patch applied to the right chest wall with some improvement.  Suspect musculoskeletal etiology of his symptoms.  He is requesting a steroid taper however he recently received on from his PCP and his diabetes is not currently well controlled.  I recommend follow-up with PCP for management of his acute on chronic pain.  Discussed strict ED return precautions. Pt verbalized understanding of and agreement with plan and is safe for discharge home at this time.    Final Clinical Impressions(s) / ED Diagnoses   Final diagnoses:  Atypical chest pain    ED Discharge Orders         Ordered    Lidocaine 1.8 % PTCH  Daily PRN     10 /27/19 1153           Hugo Lybrand, [place], PA-C 10/23/18 [place], DO 10/23/18 1609

## 2018-10-23 NOTE — Discharge Instructions (Signed)
You can take 500 to 1000 mg of Tylenol every 6 hours as needed for pain.  You can apply lidocaine patches to areas of tenderness as prescribed.  You can also apply ice or heat for comfort whichever feels best.  Follow-up with your primary care physician for reevaluation.  He may want to start you on a steroid taper or give you other types of pain medicines that we cannot prescribe from the emergency department.  Return to the emergency department immediately for any concerning signs or symptoms develop such as fevers, worsening pains, shortness of breath, persistent vomiting.

## 2018-10-23 NOTE — ED Triage Notes (Signed)
Patient reports central chest pain that began this am. Reports occasional cough. resp e/u, nad. Hx of open heart surgery. Pt has ICD in place

## 2018-10-23 NOTE — ED Notes (Signed)
Gave pt a sandwich and diet coke.

## 2018-10-26 ENCOUNTER — Encounter: Payer: Medicare Other | Admitting: *Deleted

## 2018-10-26 ENCOUNTER — Telehealth: Payer: Self-pay

## 2018-10-26 ENCOUNTER — Telehealth: Payer: Self-pay | Admitting: Cardiology

## 2018-10-26 IMAGING — DX DG CHEST 2V
2 series · 2 of 2 positions shown · non-contrast
Comparison: 04/11/2017 chest radiograph.

CLINICAL DATA: 60 y/o M; shortness of breath worsening since
[REDACTED].

EXAM:
CHEST  2 VIEW

[chest pa]
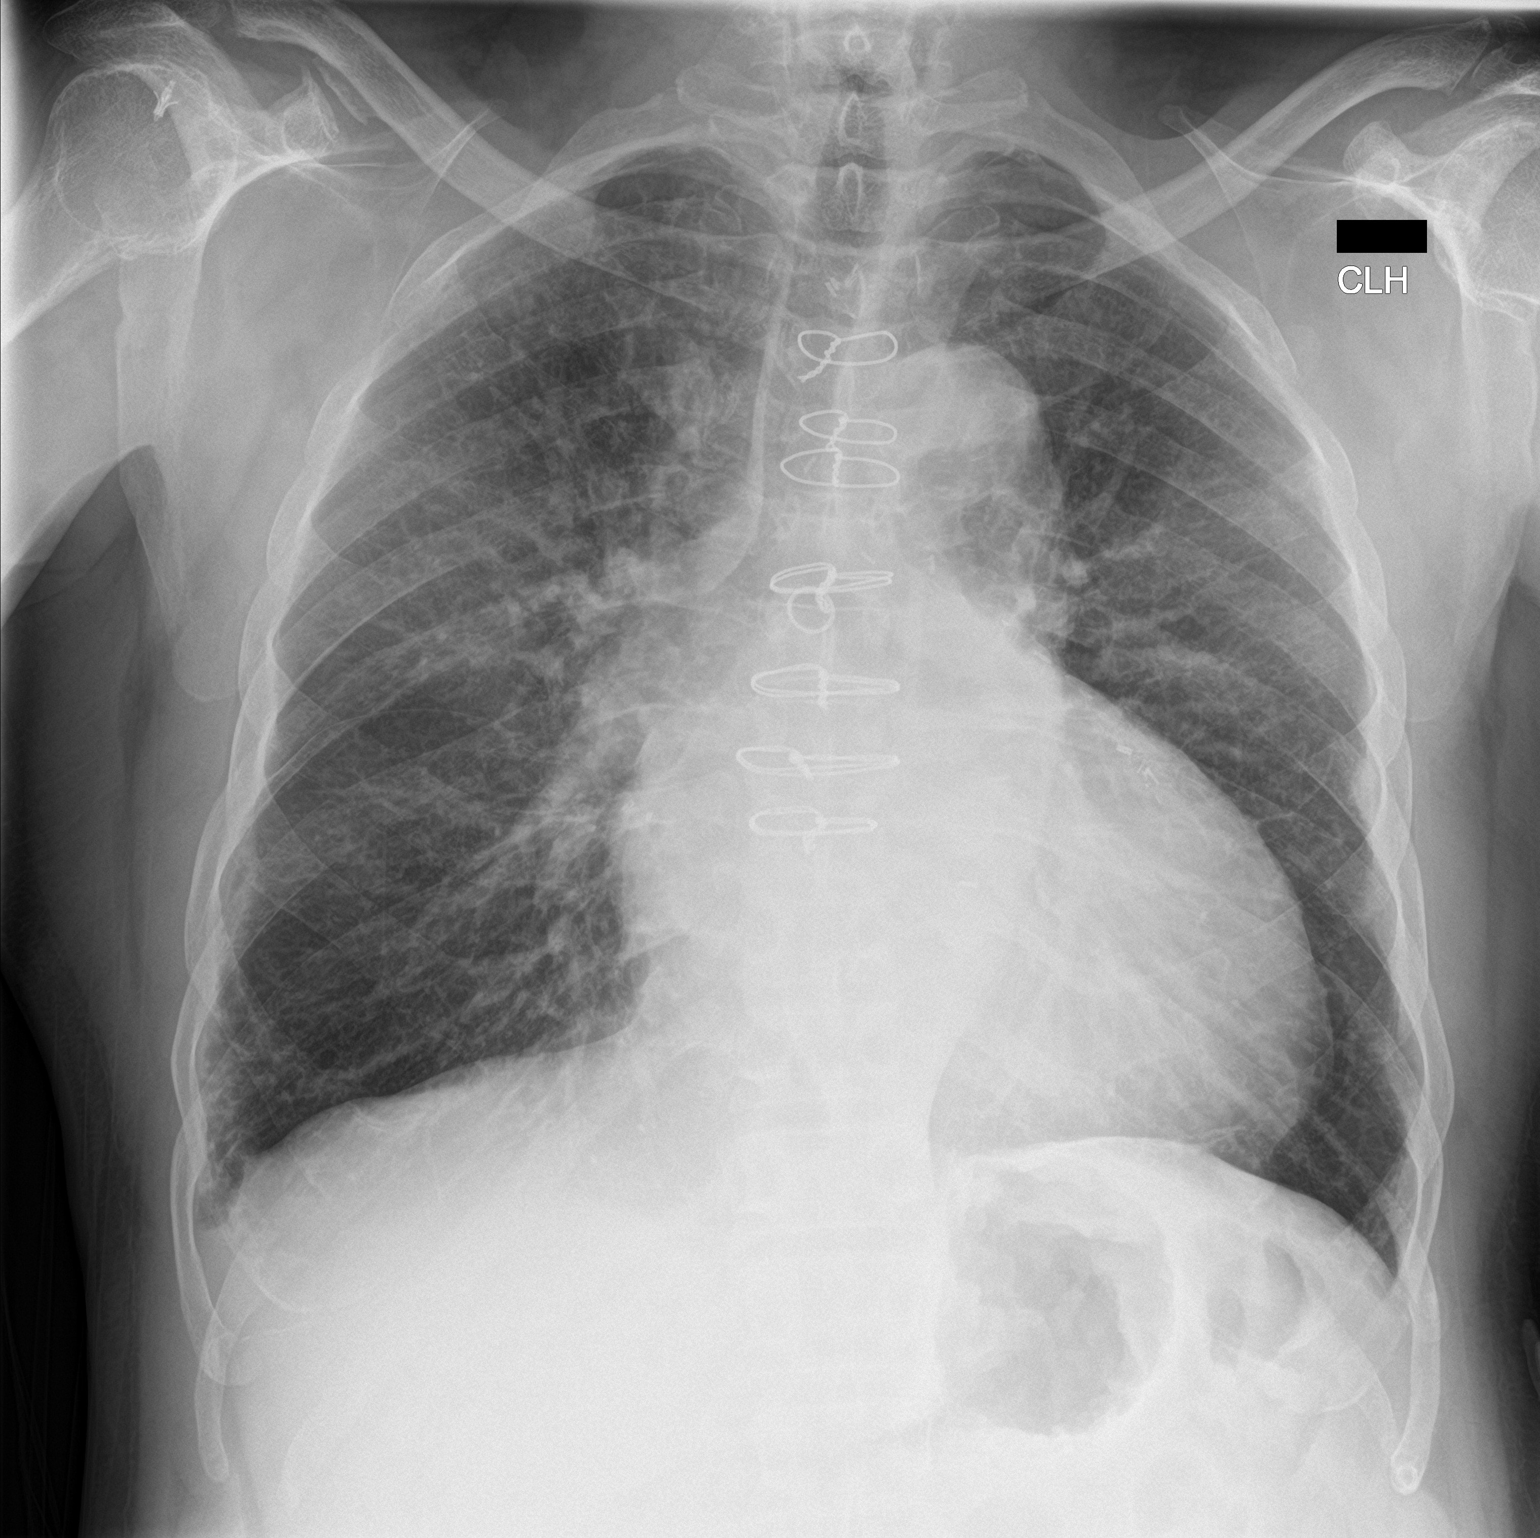

[chest lat]
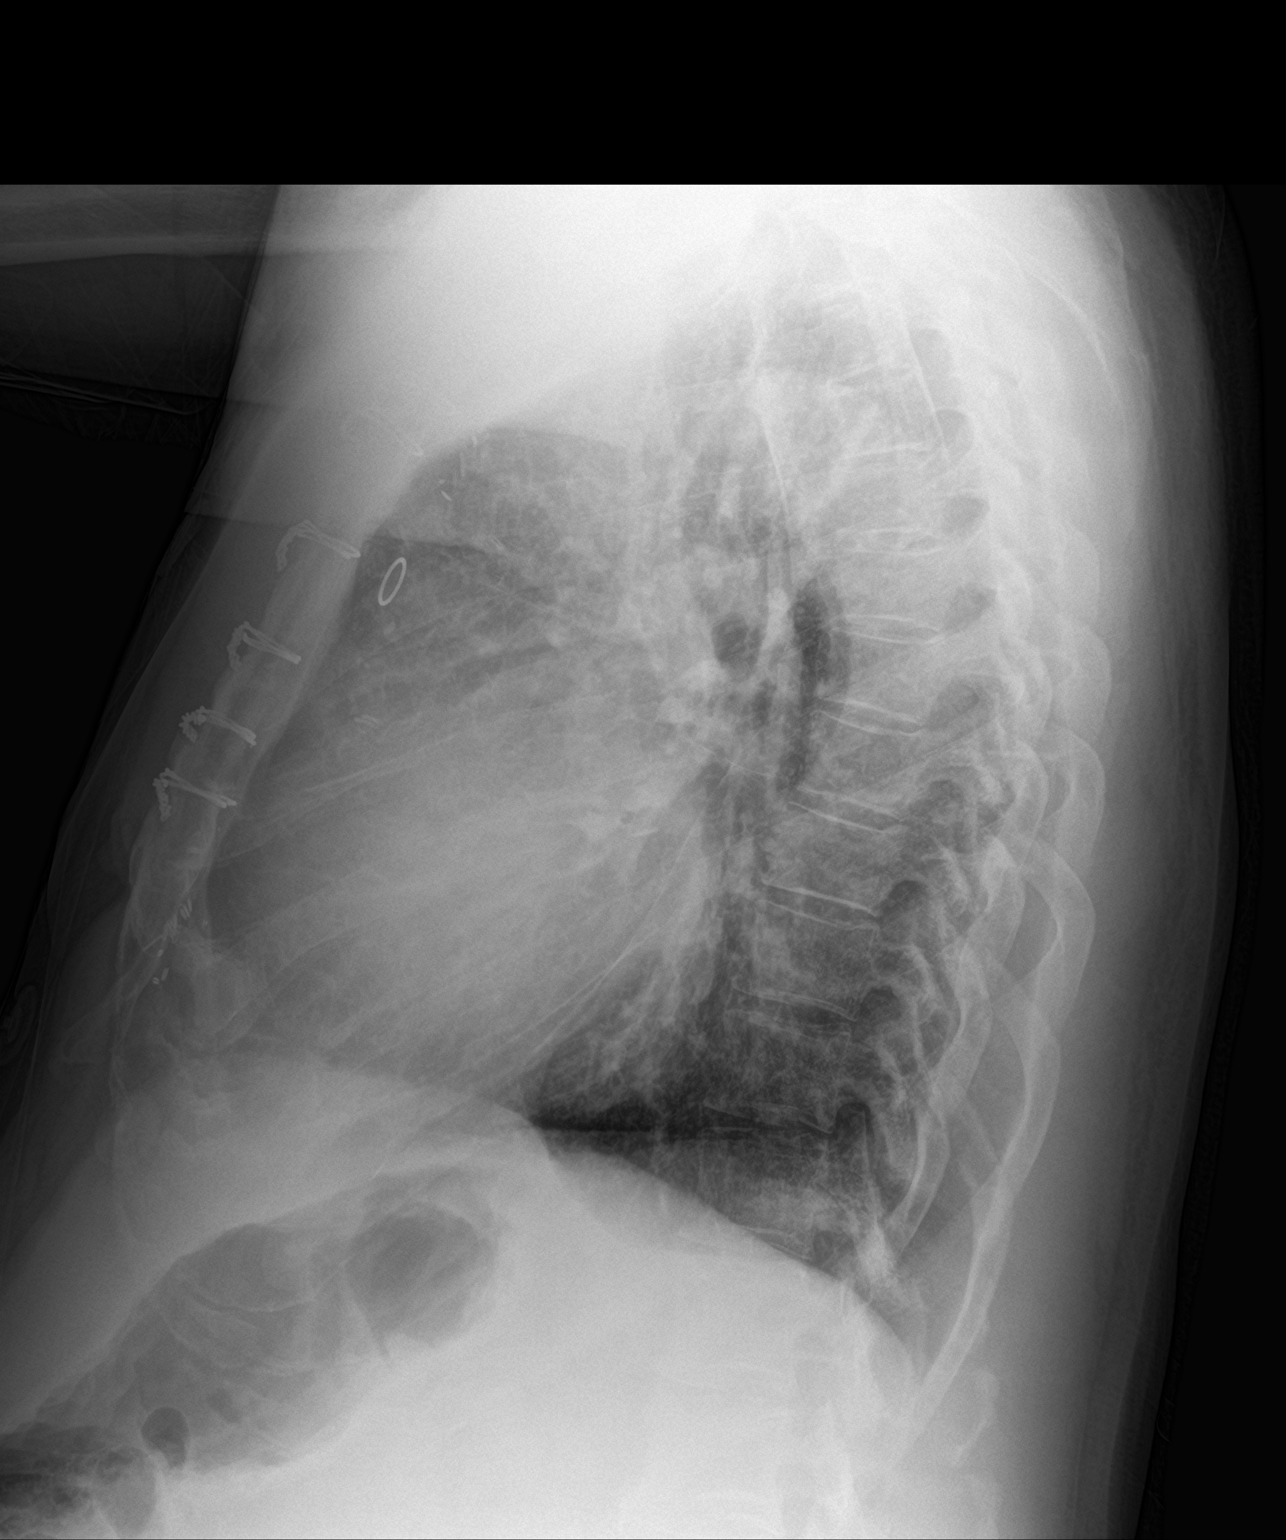

[2 of 2 positions shown; findings below may reference images not displayed]

FINDINGS: Stable moderate cardiomegaly post CABG. Sternotomy wires are
aligned. Interstitial pulmonary edema. Chronic reticular markings of
the lung peripheries probably represents mild fibrosis. No acute
osseous abnormality is evident. Blunted right costal diaphragmatic
angle.
IMPRESSION: 1. Mild interstitial pulmonary edema.
2. Stable moderate cardiomegaly.
3. Blunted right costal diaphragmatic angle, possible small
effusion.

By: Carmen Nydia Salleg M.D.

## 2018-10-26 NOTE — Telephone Encounter (Signed)
Called patient to see if he needed ED follow up scheduled with Dr. Nani Ravens. Patient states he does not at this time. Advised to call the office if he feels he needs to be seen.

## 2018-10-26 NOTE — Telephone Encounter (Signed)
LMOVM reminding pt to send remote transmission.   

## 2018-10-28 ENCOUNTER — Encounter: Payer: Self-pay | Admitting: Cardiology

## 2018-11-11 ENCOUNTER — Ambulatory Visit (INDEPENDENT_AMBULATORY_CARE_PROVIDER_SITE_OTHER): Payer: Medicare Other | Admitting: Family Medicine

## 2018-11-11 ENCOUNTER — Encounter: Payer: Self-pay | Admitting: Family Medicine

## 2018-11-11 VITALS — BP 152/90 | HR 105 | Temp 97.6°F | Ht 70.0 in | Wt 185.2 lb

## 2018-11-11 DIAGNOSIS — M109 Gout, unspecified: Secondary | ICD-10-CM | POA: Diagnosis not present

## 2018-11-11 DIAGNOSIS — I10 Essential (primary) hypertension: Secondary | ICD-10-CM | POA: Diagnosis not present

## 2018-11-11 DIAGNOSIS — J449 Chronic obstructive pulmonary disease, unspecified: Secondary | ICD-10-CM

## 2018-11-11 DIAGNOSIS — J441 Chronic obstructive pulmonary disease with (acute) exacerbation: Secondary | ICD-10-CM | POA: Diagnosis not present

## 2018-11-11 DIAGNOSIS — M549 Dorsalgia, unspecified: Secondary | ICD-10-CM | POA: Diagnosis not present

## 2018-11-11 MED ORDER — METHYLPREDNISOLONE ACETATE 80 MG/ML IJ SUSP
80.0000 mg | Freq: Once | INTRAMUSCULAR | Status: AC
  Administered 2018-11-11: 80 mg via INTRAMUSCULAR

## 2018-11-11 MED ORDER — ALBUTEROL SULFATE (2.5 MG/3ML) 0.083% IN NEBU: INHALATION_SOLUTION | RESPIRATORY_TRACT | 1 refills | Status: AC

## 2018-11-11 MED ORDER — TIZANIDINE HCL 4 MG PO CAPS: 4.0000 mg | ORAL_CAPSULE | Freq: Three times a day (TID) | ORAL | 0 refills | Status: DC | PRN

## 2018-11-11 MED ORDER — IPRATROPIUM-ALBUTEROL 0.5-2.5 (3) MG/3ML IN SOLN
3.0000 mL | Freq: Four times a day (QID) | RESPIRATORY_TRACT | Status: DC
  Administered 2018-11-11 (×2): 3 mL via RESPIRATORY_TRACT

## 2018-11-11 MED ORDER — IPRATROPIUM-ALBUTEROL 0.5-2.5 (3) MG/3ML IN SOLN
3.0000 mL | Freq: Once | RESPIRATORY_TRACT | Status: AC
  Administered 2018-11-11: 3 mL via RESPIRATORY_TRACT

## 2018-11-11 MED ORDER — PREDNISONE 20 MG PO TABS: 40.0000 mg | ORAL_TABLET | Freq: Every day | ORAL | 0 refills | Status: AC

## 2018-11-11 NOTE — Patient Instructions (Addendum)
Start prednisone tomorrow.  Heat (pad or rice pillow in microwave) over affected area, 10-15 minutes twice daily.   Ice/cold pack over area for 10-15 min twice daily.  OK to take Tylenol 1000 mg (2 extra strength tabs) or 975 mg (3 regular strength tabs) every 6 hours as needed.  Give [place] 2-3 business days to get the results of your labs back. We will be in touch regarding any change in your medicine also.  Stretch area on back.   Let us know if you need anything.

## 2018-11-11 NOTE — Progress Notes (Signed)
Musculoskeletal Exam  Patient: Patrick Stewart DOB: May 22, 1956  DOS: 11/11/2018  SUBJECTIVE:  Chief Complaint:   Chief Complaint  Patient presents with  . Pain    Patrick Stewart is a 62 y.o.  male for evaluation and treatment of L elbow and back pain.   Onset:  3 days ago. No inj or change in activity.  Location: elbow Character:  aching  Progression of issue:  is unchanged Associated symptoms: Swelling Treatment: to date has been rest.   Neurovascular symptoms: no  Entire back also hurting. Hurts mainly when he moves. No inj or change in activity. Pain patches have not been helpful.   Also over past couple days he has been having increased sob and wheezing. Has not had a sig change in weight (+hx of CHF).   ROS: Musculoskeletal/Extremities: +back and wrist pain Lungs: +SOB  Past Medical History:  Diagnosis Date  . Angina at rest [facility]) 07/08/2016  . CAD (coronary artery disease) 2010   3v CABG  . Chest pain at rest 07/08/2016  . CHF (congestive heart failure) ([place]) 01/26/2012  . CKD (chronic kidney disease) 02/19/2012  . COPD with chronic bronchitis ([place]) 08/26/2016  . Diabetes mellitus type 2, uncontrolled ([place]) 01/26/2012  . Elevated liver function tests 01/26/2012  . Gout 01/26/2012  . Hx of CABG 2010   RCA Stent 2008, CABG x 2 [facility] 2010  . Hypercholesterolemia 07/08/2016  . Hypertension 09/04/2014  . Ischemic cardiomyopathy 11/15/2014  . Kidney stone 10/25/2015  . Lipid disorder 01/26/2012  . Noncompliance 10/18/2018  . NSTEMI (non-ST elevated myocardial infarction) ([place]) 07/08/2016  . ST elevation myocardial infarction (STEMI) ([place]) 2010  . Unstable angina ([place]) 11/14/2014    Objective: VITAL SIGNS: BP (!) 152/90 (BP Location: Left Arm, Patient Position: Sitting, Cuff Size: Normal)   Pulse (!) 105   Temp 97.6 F (36.4 C) (Oral)   Ht 5\' 10"  (1.778 m)   Wt 185 lb 4 oz (84 kg)   SpO2 98%   BMI 26.58 kg/m  Constitutional: Well formed, well developed. No  acute distress. Cardiovascular: Brisk cap refill, RRR, 1+ pitting edema up to knees b/l Thorax & Lungs: No accessory muscle use. CTAB.  Musculoskeletal: L wrist.   Normal active range of motion: no.   Normal passive range of motion: no Tenderness to palpation: yes over dorsal carpal side and lateral forearm Deformity: no Ecchymosis: no Neurologic: Normal sensory function. No focal deficits noted. DTR's equal and symmetry in UE's. No clonus. Psychiatric: Normal mood. Age appropriate judgment and insight. Alert & oriented x 3.    Assessment:  Acute gout of left wrist, unspecified cause - Plan: predniSONE (DELTASONE) 20 MG tablet, Uric acid, methylPREDNISolone acetate (DEPO-MEDROL) injection 80 mg  Acute bilateral back pain, unspecified back location - Plan: tiZANidine (ZANAFLEX) 4 MG capsule  Essential hypertension  COPD exacerbation ([place]) - Plan: ipratropium-albuterol (DUONEB) 0.5-2.5 (3) MG/3ML nebulizer solution 3 mL  Plan:  Ck uric acid. Steroid IM inj today, pred starting tomorrow. Heat, Tylenol, pred.  Stretch. Recheck when he is less painful. Neb tx today.  F/u in 2 weeks to reck BP and pain. The patient voiced understanding and agreement to the plan.   [place], DO 11/11/18  11:53 AM

## 2018-11-11 NOTE — Addendum Note (Signed)
Addended by: Sharon Seller B on: 11/11/2018 03:10 PM   Modules accepted: Orders

## 2018-11-11 NOTE — Progress Notes (Signed)
Pre visit review using our clinic review tool, if applicable. No additional management support is needed unless otherwise documented below in the visit note. 

## 2018-12-27 DIAGNOSIS — R9431 Abnormal electrocardiogram [ECG] [EKG]: Secondary | ICD-10-CM | POA: Diagnosis not present

## 2018-12-27 DIAGNOSIS — Z95810 Presence of automatic (implantable) cardiac defibrillator: Secondary | ICD-10-CM | POA: Diagnosis not present

## 2018-12-27 DIAGNOSIS — I420 Dilated cardiomyopathy: Secondary | ICD-10-CM | POA: Diagnosis present

## 2018-12-27 DIAGNOSIS — I509 Heart failure, unspecified: Secondary | ICD-10-CM | POA: Diagnosis not present

## 2018-12-27 DIAGNOSIS — I252 Old myocardial infarction: Secondary | ICD-10-CM | POA: Diagnosis not present

## 2018-12-27 DIAGNOSIS — Z951 Presence of aortocoronary bypass graft: Secondary | ICD-10-CM | POA: Diagnosis not present

## 2018-12-27 DIAGNOSIS — E119 Type 2 diabetes mellitus without complications: Secondary | ICD-10-CM | POA: Diagnosis not present

## 2018-12-27 DIAGNOSIS — G4489 Other headache syndrome: Secondary | ICD-10-CM | POA: Diagnosis not present

## 2018-12-27 DIAGNOSIS — R0789 Other chest pain: Secondary | ICD-10-CM | POA: Diagnosis not present

## 2018-12-27 DIAGNOSIS — I255 Ischemic cardiomyopathy: Secondary | ICD-10-CM | POA: Diagnosis present

## 2018-12-27 DIAGNOSIS — E785 Hyperlipidemia, unspecified: Secondary | ICD-10-CM | POA: Diagnosis present

## 2018-12-27 DIAGNOSIS — I10 Essential (primary) hypertension: Secondary | ICD-10-CM | POA: Diagnosis not present

## 2018-12-27 DIAGNOSIS — I129 Hypertensive chronic kidney disease with stage 1 through stage 4 chronic kidney disease, or unspecified chronic kidney disease: Secondary | ICD-10-CM | POA: Diagnosis present

## 2018-12-27 DIAGNOSIS — J449 Chronic obstructive pulmonary disease, unspecified: Secondary | ICD-10-CM | POA: Diagnosis not present

## 2018-12-27 DIAGNOSIS — R42 Dizziness and giddiness: Secondary | ICD-10-CM | POA: Diagnosis not present

## 2018-12-27 DIAGNOSIS — I5023 Acute on chronic systolic (congestive) heart failure: Secondary | ICD-10-CM | POA: Diagnosis present

## 2018-12-27 DIAGNOSIS — E871 Hypo-osmolality and hyponatremia: Secondary | ICD-10-CM | POA: Diagnosis not present

## 2018-12-27 DIAGNOSIS — M109 Gout, unspecified: Secondary | ICD-10-CM | POA: Diagnosis present

## 2018-12-27 DIAGNOSIS — I110 Hypertensive heart disease with heart failure: Secondary | ICD-10-CM | POA: Diagnosis not present

## 2018-12-27 DIAGNOSIS — Z79899 Other long term (current) drug therapy: Secondary | ICD-10-CM | POA: Diagnosis not present

## 2018-12-27 DIAGNOSIS — R7989 Other specified abnormal findings of blood chemistry: Secondary | ICD-10-CM | POA: Diagnosis not present

## 2018-12-27 DIAGNOSIS — N179 Acute kidney failure, unspecified: Secondary | ICD-10-CM | POA: Diagnosis not present

## 2018-12-27 DIAGNOSIS — J439 Emphysema, unspecified: Secondary | ICD-10-CM | POA: Diagnosis present

## 2018-12-27 DIAGNOSIS — K219 Gastro-esophageal reflux disease without esophagitis: Secondary | ICD-10-CM | POA: Diagnosis present

## 2018-12-27 DIAGNOSIS — I493 Ventricular premature depolarization: Secondary | ICD-10-CM | POA: Diagnosis present

## 2018-12-27 DIAGNOSIS — Z7984 Long term (current) use of oral hypoglycemic drugs: Secondary | ICD-10-CM | POA: Diagnosis not present

## 2018-12-27 DIAGNOSIS — I214 Non-ST elevation (NSTEMI) myocardial infarction: Secondary | ICD-10-CM | POA: Diagnosis not present

## 2018-12-27 DIAGNOSIS — Z9861 Coronary angioplasty status: Secondary | ICD-10-CM | POA: Diagnosis not present

## 2018-12-27 DIAGNOSIS — R079 Chest pain, unspecified: Secondary | ICD-10-CM | POA: Diagnosis not present

## 2018-12-27 DIAGNOSIS — I499 Cardiac arrhythmia, unspecified: Secondary | ICD-10-CM | POA: Diagnosis not present

## 2018-12-27 DIAGNOSIS — I130 Hypertensive heart and chronic kidney disease with heart failure and stage 1 through stage 4 chronic kidney disease, or unspecified chronic kidney disease: Secondary | ICD-10-CM | POA: Diagnosis not present

## 2018-12-27 DIAGNOSIS — I2510 Atherosclerotic heart disease of native coronary artery without angina pectoris: Secondary | ICD-10-CM | POA: Diagnosis present

## 2018-12-27 DIAGNOSIS — R0602 Shortness of breath: Secondary | ICD-10-CM | POA: Diagnosis not present

## 2018-12-27 DIAGNOSIS — I5022 Chronic systolic (congestive) heart failure: Secondary | ICD-10-CM | POA: Diagnosis not present

## 2018-12-27 DIAGNOSIS — I160 Hypertensive urgency: Secondary | ICD-10-CM | POA: Diagnosis present

## 2018-12-27 DIAGNOSIS — E1122 Type 2 diabetes mellitus with diabetic chronic kidney disease: Secondary | ICD-10-CM | POA: Diagnosis present

## 2018-12-27 DIAGNOSIS — N183 Chronic kidney disease, stage 3 (moderate): Secondary | ICD-10-CM | POA: Diagnosis present

## 2018-12-27 DIAGNOSIS — N189 Chronic kidney disease, unspecified: Secondary | ICD-10-CM | POA: Diagnosis not present

## 2019-01-13 ENCOUNTER — Ambulatory Visit (INDEPENDENT_AMBULATORY_CARE_PROVIDER_SITE_OTHER): Payer: Medicare Other | Admitting: Family Medicine

## 2019-01-13 ENCOUNTER — Encounter: Payer: Self-pay | Admitting: Family Medicine

## 2019-01-13 VITALS — BP 142/92 | HR 95 | Temp 97.5°F | Ht 70.0 in | Wt 181.4 lb

## 2019-01-13 DIAGNOSIS — M109 Gout, unspecified: Secondary | ICD-10-CM | POA: Diagnosis not present

## 2019-01-13 DIAGNOSIS — I509 Heart failure, unspecified: Secondary | ICD-10-CM

## 2019-01-13 LAB — COMPREHENSIVE METABOLIC PANEL
ALBUMIN: 3.1 g/dL — AB (ref 3.5–5.2)
ALK PHOS: 103 U/L (ref 39–117)
ALT: 10 U/L (ref 0–53)
AST: 20 U/L (ref 0–37)
BILIRUBIN TOTAL: 1.3 mg/dL — AB (ref 0.2–1.2)
BUN: 33 mg/dL — ABNORMAL HIGH (ref 6–23)
CHLORIDE: 99 meq/L (ref 96–112)
CO2: 26 mEq/L (ref 19–32)
Calcium: 8.9 mg/dL (ref 8.4–10.5)
Creatinine, Ser: 1.49 mg/dL (ref 0.40–1.50)
GFR: 57.58 mL/min — ABNORMAL LOW (ref >60.00)
GLUCOSE: 138 mg/dL — AB (ref 70–99)
Potassium: 4 mEq/L (ref 3.5–5.1)
SODIUM: 135 meq/L (ref 135–145)
TOTAL PROTEIN: 8.2 g/dL (ref 6.0–8.3)

## 2019-01-13 LAB — URIC ACID: Uric Acid, Serum: 11.7 mg/dL — ABNORMAL HIGH (ref 4.0–7.8)

## 2019-01-13 MED ORDER — CARVEDILOL 12.5 MG PO TABS: 12.5000 mg | ORAL_TABLET | Freq: Two times a day (BID) | ORAL | 5 refills | Status: DC

## 2019-01-13 MED ORDER — PREDNISONE 20 MG PO TABS: 40.0000 mg | ORAL_TABLET | Freq: Every day | ORAL | 0 refills | Status: AC

## 2019-01-13 NOTE — Progress Notes (Signed)
Chief Complaint  Patient presents with  . Hospitalization Follow-up    Subjective: Patient is a 63 y.o. male here for hosp f/u.  Was in [place] from 12/31 to 1/5 for CHF exacerbation and acute on chronic KD.  He feels much better overall but is still having some shortness of breath and cough.  He has yet to see his heart doctor since being discharged from the hospital.  They changed a few of his medications including his metformin.  He feels he is having a gout flare in his right ankle at this time.  His allopurinol was decreased from 400 mg daily to 100 mg daily.  He was scheduled to have an appointment with the [facility] specialist at [facility].   ROS: Heart: Denies chest pain  Lungs: +SOB   Past Medical History:  Diagnosis Date  . Angina at rest [facility]) 07/08/2016  . CAD (coronary artery disease) 2010   3v CABG  . Chest pain at rest 07/08/2016  . CHF (congestive heart failure) ([place]) 01/26/2012  . CKD (chronic kidney disease) 02/19/2012  . COPD with chronic bronchitis ([place]) 08/26/2016  . Diabetes mellitus type 2, uncontrolled ([place]) 01/26/2012  . Elevated liver function tests 01/26/2012  . Gout 01/26/2012  . Hx of CABG 2010   RCA Stent 2008, CABG x 2 [facility] 2010  . Hypercholesterolemia 07/08/2016  . Hypertension 09/04/2014  . Ischemic cardiomyopathy 11/15/2014  . Kidney stone 10/25/2015  . Lipid disorder 01/26/2012  . Noncompliance 10/18/2018  . NSTEMI (non-ST elevated myocardial infarction) ([place]) 07/08/2016  . ST elevation myocardial infarction (STEMI) ([place]) 2010  . Unstable angina ([facility]) 11/14/2014    Objective: BP (!) 142/92 (BP Location: Right Arm, Patient Position: Sitting, Cuff Size: Normal)   Pulse 95   Temp (!) 97.5 F (36.4 C) (Oral)   Ht 5\' 10"  (1.778 m)   Wt 181 lb 6 oz (82.3 kg)   SpO2 95%   BMI 26.02 kg/m  General: Awake, appears stated age HEENT: MMM, EOMi Heart: RRR, no murmurs Lungs: CTAB, no rales, wheezes or rhonchi. No accessory muscle use Psych: Age  appropriate judgment and insight, normal affect and mood  Assessment and Plan: Chronic congestive heart failure, unspecified heart failure type ([place]) - Plan: carvedilol (COREG) 12.5 MG tablet, Comprehensive metabolic panel  Acute gout of right ankle, unspecified cause - Plan: predniSONE (DELTASONE) 20 MG tablet, Uric acid, Comprehensive metabolic panel, allopurinol (ZYLOPRIM) 300 MG tablet  Orders as above.  Check labs.  I would like him to see his cardiologist. Prednisone for gout, if his uric acid is still very elevated, will consider referral to rheumatology. Follow-up in 6 weeks. The patient voiced understanding and agreement to the plan.  [place], DO 01/13/19  11:48 AM

## 2019-01-13 NOTE — Patient Instructions (Addendum)
Give [place] 2-3 business days to get the results of your labs back.   We may send you to a specialist if your gout is still not controlled.  Take a full tab twice daily while you are taking the prednisone for your gout.  OK to take Tylenol 1000 mg (2 extra strength tabs) or 975 mg (3 regular strength tabs) every 6 hours as needed.  Contact your heart doctor for an appointment.   I do think seeing the kidney team would be helpful moving forward.  Let us know if you need anything.

## 2019-01-13 NOTE — Progress Notes (Signed)
Pre visit review using our clinic review tool, if applicable. No additional management support is needed unless otherwise documented below in the visit note. 

## 2019-01-20 ENCOUNTER — Ambulatory Visit: Payer: Self-pay

## 2019-01-20 NOTE — Telephone Encounter (Signed)
Patient called in with c/o "knee swelling." He says "I saw Dr. Nani Ravens on 1/17 and he gave me prednisone and allopurinol for gout in my ankle. I took all the prednisone, but my knee is swollen, very painful at a 9, warm to the touch. I know it is gout, because I know what it feels like." I asked about redness, he says "not red, but warm to touch." I asked about other symptoms, he denies. According to protocol, see PCP within 24 hours, patient says he only wants to see Dr. Nani Ravens. I advised no appointments with Dr. Nani Ravens until Wednesday, 01/25/19. He says he will wait and see how things go and if the pain gets worse, he will go to the ED or call back on Monday for the appointment.   Reason for Disposition . [1] Very swollen joint AND [2] no fever  Answer Assessment - Initial Assessment Questions 1. LOCATION: "Where is the swelling located?"  (e.g., left, right, both knees)     Right knee 2. SIZE and DESCRIPTION: "What does the swelling look like?"  (e.g., entire knee, localized)     Knee cap swollen 3. ONSET: "When did the swelling start?" "Does it come and go, or is it there all the time?"     Yesterday evening 4. PAIN: "Is there any pain?" If so, ask: "How bad is it?" (Scale 1-10; or mild, moderate, severe)     9 5. SETTING: "Has there been any recent work, exercise or other activity that involved that part of the body?"      No 6. AGGRAVATING FACTORS: "What makes the knee swelling worse?" (e.g., walking, climbing stairs, running)     Walking, climbing, raising leg up 7. ASSOCIATED SYMPTOMS: "Is there any pain or redness?"     Warm to touch 8. OTHER SYMPTOMS: "Do you have any other symptoms?" (e.g., chest pain, difficulty breathing, fever, calf pain)     No 9. PREGNANCY: "Is there any chance you are pregnant?" "When was your last menstrual period?"     N/A  Protocols used: K[facility]EE [facility]

## 2019-01-22 DIAGNOSIS — R079 Chest pain, unspecified: Secondary | ICD-10-CM | POA: Diagnosis not present

## 2019-01-22 DIAGNOSIS — I5023 Acute on chronic systolic (congestive) heart failure: Secondary | ICD-10-CM | POA: Diagnosis not present

## 2019-01-22 DIAGNOSIS — R0789 Other chest pain: Secondary | ICD-10-CM | POA: Diagnosis not present

## 2019-01-22 DIAGNOSIS — R072 Precordial pain: Secondary | ICD-10-CM | POA: Diagnosis not present

## 2019-01-22 DIAGNOSIS — R0989 Other specified symptoms and signs involving the circulatory and respiratory systems: Secondary | ICD-10-CM | POA: Diagnosis not present

## 2019-01-22 DIAGNOSIS — I10 Essential (primary) hypertension: Secondary | ICD-10-CM | POA: Diagnosis not present

## 2019-01-22 DIAGNOSIS — Z7984 Long term (current) use of oral hypoglycemic drugs: Secondary | ICD-10-CM | POA: Diagnosis not present

## 2019-01-22 DIAGNOSIS — R9431 Abnormal electrocardiogram [ECG] [EKG]: Secondary | ICD-10-CM | POA: Diagnosis not present

## 2019-01-22 DIAGNOSIS — Z79899 Other long term (current) drug therapy: Secondary | ICD-10-CM | POA: Diagnosis not present

## 2019-01-22 DIAGNOSIS — R7989 Other specified abnormal findings of blood chemistry: Secondary | ICD-10-CM | POA: Diagnosis not present

## 2019-01-22 DIAGNOSIS — E1165 Type 2 diabetes mellitus with hyperglycemia: Secondary | ICD-10-CM | POA: Diagnosis not present

## 2019-01-22 DIAGNOSIS — M25561 Pain in right knee: Secondary | ICD-10-CM | POA: Diagnosis not present

## 2019-01-22 DIAGNOSIS — M1711 Unilateral primary osteoarthritis, right knee: Secondary | ICD-10-CM | POA: Diagnosis not present

## 2019-01-22 DIAGNOSIS — M7989 Other specified soft tissue disorders: Secondary | ICD-10-CM | POA: Diagnosis not present

## 2019-01-22 DIAGNOSIS — M25461 Effusion, right knee: Secondary | ICD-10-CM | POA: Diagnosis not present

## 2019-01-23 ENCOUNTER — Telehealth: Payer: Self-pay | Admitting: Family Medicine

## 2019-01-23 DIAGNOSIS — I5023 Acute on chronic systolic (congestive) heart failure: Secondary | ICD-10-CM | POA: Diagnosis not present

## 2019-01-23 DIAGNOSIS — M109 Gout, unspecified: Secondary | ICD-10-CM | POA: Diagnosis not present

## 2019-01-23 DIAGNOSIS — R0789 Other chest pain: Secondary | ICD-10-CM | POA: Diagnosis not present

## 2019-01-23 NOTE — Telephone Encounter (Signed)
See if we can get him in today. TY.

## 2019-01-23 NOTE — Telephone Encounter (Signed)
Called left message to call back 

## 2019-01-23 NOTE — Telephone Encounter (Signed)
2nd enc for same issue, will close this as it was addressed in another one.

## 2019-01-23 NOTE — Telephone Encounter (Signed)
Copied from [place] [id number]. Topic: General - Other >> Jan 20, 2019  9:25 AM Antonieta Iba C wrote: Reason for CRM: pt says that PCP gave him prednisone at his last ov. Pt says that medication helped his gout flare up for a few days and now his swelling is back. Pt would like to be advised further on what he could do.   CB: [phone]  Pharmacy: [id number] [place], [place] - [address] AT [address] [phone] (Phone) [phone] (Fax)

## 2019-01-26 NOTE — Telephone Encounter (Signed)
Called unable to get the patient on the phone.

## 2019-02-15 ENCOUNTER — Other Ambulatory Visit: Payer: Self-pay | Admitting: Family Medicine

## 2019-02-15 ENCOUNTER — Ambulatory Visit (INDEPENDENT_AMBULATORY_CARE_PROVIDER_SITE_OTHER): Payer: Medicare Other | Admitting: Family Medicine

## 2019-02-15 ENCOUNTER — Encounter: Payer: Self-pay | Admitting: Family Medicine

## 2019-02-15 VITALS — BP 120/70 | HR 60 | Temp 97.9°F | Ht 70.0 in | Wt 176.5 lb

## 2019-02-15 DIAGNOSIS — M109 Gout, unspecified: Secondary | ICD-10-CM

## 2019-02-15 DIAGNOSIS — E1142 Type 2 diabetes mellitus with diabetic polyneuropathy: Secondary | ICD-10-CM

## 2019-02-15 DIAGNOSIS — M1A9XX0 Chronic gout, unspecified, without tophus (tophi): Secondary | ICD-10-CM

## 2019-02-15 LAB — MICROALBUMIN / CREATININE URINE RATIO
Creatinine,U: 173.1 mg/dL
Microalb Creat Ratio: 54.7 mg/g — ABNORMAL HIGH (ref 0.0–30.0)
Microalb, Ur: 94.7 mg/dL — ABNORMAL HIGH (ref 0.0–1.9)

## 2019-02-15 LAB — HEMOGLOBIN A1C: Hgb A1c MFr Bld: 8.9 % — ABNORMAL HIGH (ref 4.6–6.5)

## 2019-02-15 LAB — URIC ACID: Uric Acid, Serum: 9.5 mg/dL — ABNORMAL HIGH (ref 4.0–7.8)

## 2019-02-15 MED ORDER — CELECOXIB 100 MG PO CAPS: 100.0000 mg | ORAL_CAPSULE | Freq: Two times a day (BID) | ORAL | 0 refills | Status: DC

## 2019-02-15 MED ORDER — VENLAFAXINE HCL ER 37.5 MG PO CP24: 37.5000 mg | ORAL_CAPSULE | Freq: Every day | ORAL | 3 refills | Status: DC

## 2019-02-15 MED ORDER — ALLOPURINOL 300 MG PO TABS: 300.0000 mg | ORAL_TABLET | Freq: Two times a day (BID) | ORAL | 6 refills | Status: DC

## 2019-02-15 NOTE — Progress Notes (Signed)
Chief Complaint  Patient presents with  . Pain    Subjective: Patient is a 63 y.o. male here for diab neuropathy.  Patient has a history of poorly controlled diabetes.  He is noncompliant with some of his medications.  He is currently on metformin 1000 mg twice daily.  He says his diet is healthy.  He refuses DM eye exams.  He, he has been having, over the past 2 weeks, itching/burning/pins/needles sensation in his hands and feet radiating up his forearms and legs respectively.  He has tried Vicks VapoRub that has been helpful.  The patient has a history of uncontrolled gout.  There was a question of compliance at his last lab draw where his uric acid was 11.7.  He is currently taking allopurinol 300 mg daily.  He is careful to avoid urate increasing foods.  His knees are affected mainly.  He recently had a flare and noted that celecoxib works particularly helpful.  ROS: Neuro: As noted in HPI Const: No fevers  Past Medical History:  Diagnosis Date  . CAD (coronary artery disease) 2010   3v CABG  . CHF (congestive heart failure) ([place]) 01/26/2012  . CKD (chronic kidney disease) 02/19/2012  . COPD with chronic bronchitis ([place]) 08/26/2016  . Diabetes mellitus type 2, uncontrolled ([place]) 01/26/2012  . Elevated liver function tests 01/26/2012  . Gout 01/26/2012  . Hx of CABG 2010   RCA Stent 2008, CABG x 2 [facility] 2010  . Hypercholesterolemia 07/08/2016  . Hypertension 09/04/2014  . Ischemic cardiomyopathy 11/15/2014  . Kidney stone 10/25/2015  . Lipid disorder 01/26/2012  . Noncompliance 10/18/2018  . NSTEMI (non-ST elevated myocardial infarction) ([place]) 07/08/2016  . ST elevation myocardial infarction (STEMI) ([place]) 2010  . Unstable angina (HCC) 11/14/2014    Objective: BP 120/70 (BP Location: Left Arm, Patient Position: Sitting, Cuff Size: Normal)   Pulse 60   Temp 97.9 F (36.6 C) (Oral)   Ht 5\' 10"  (1.778 m)   Wt 176 lb 8 oz (80.1 kg)   SpO2 98%   BMI 25.33 kg/m  General:  Awake, appears stated age HEENT: MMM, EOMi Heart: RRR, 1+ pitting bilateral lower extremity edema to proximal third of tibia Lungs: CTAB, no rales, wheezes or rhonchi. No accessory muscle use MSK: + Effusion of right knee, no excessive warmth Neuro: Sensation intact to light touch, no cerebellar signs Psych: Age appropriate judgment and insight, normal affect and mood  Assessment and Plan: Diabetic polyneuropathy associated with type 2 diabetes mellitus ([place]) - Plan: Microalbumin / creatinine urine ratio, Hemoglobin A1c, venlafaxine XR (EFFEXOR-XR) 37.5 MG 24 hr capsule  Chronic gout without tophus, unspecified cause, unspecified site - Plan: celecoxib (CELEBREX) 100 MG capsule, Uric acid  Follow-up.  Will likely need to add a sulfonylurea.  TZD's are contraindicated given his ejection fraction.  He prefers to avoid an injection.  Start SNRI for neuropathic pain.  Given his lower extremity swelling, will avoid gabapentin early on. Refill Celebrex, check uric acid.  May increase allopurinol. Follow-up in 1 month. The patient voiced understanding and agreement to the plan.  [place], DO 02/15/19  8:36 AM

## 2019-02-15 NOTE — Patient Instructions (Addendum)
Give [place] 2-3 business days to get the results of your labs back. We may be increasing the dose of your allopurinol.  OK to continue Vick's Vaporub.  Let me know if medicine is too expensive.   Let us know if you need anything.

## 2019-02-16 ENCOUNTER — Ambulatory Visit: Payer: Self-pay | Admitting: *Deleted

## 2019-02-16 MED ORDER — AMITRIPTYLINE HCL 10 MG PO TABS: 10.0000 mg | ORAL_TABLET | Freq: Every day | ORAL | 3 refills | Status: DC

## 2019-02-16 NOTE — Addendum Note (Signed)
Addended by: Ames Coupe on: 02/16/2019 11:13 AM   Modules accepted: Orders

## 2019-02-16 NOTE — Telephone Encounter (Signed)
Patient informed of PCP instructions. He verbalized understanding. 

## 2019-02-16 NOTE — Telephone Encounter (Signed)
Pt called stating that he venlafaxine on 02/15/2019; he states that it makes him "sick" ( weak, nauseous, can't sleep, and no energy); the pt says that he took the medication at 0900 and started late evening; the pt would like to know if taking this medication and colchicine may have caused him to feel this way; the pt says that he has not taken this medication today, and his symptoms are wearing off; the pt says that he was told that another medication can be used to treat his diabetic pain; he can be contacted at [phone] and a message can be left; the pt also verifies [facility] pharmacy [facility] [address] [facility]; pt last seen by Dr Nani Ravens, [facility], 02/15/2019; will route to office for final disposition  Reason for Disposition . Caller has URGENT medication question about med that PCP prescribed and triager unable to answer question  Answer Assessment - Initial Assessment Questions 1. SYMPTOMS: "Do you have any symptoms?"     "Nausea, weakness, no energy" 2. SEVERITY: If symptoms are present, ask "Are they mild, moderate or severe?"     Symptoms rated 8 out of 10  Protocols used: MEDICATION QUESTION CALL-A-AH

## 2019-02-16 NOTE — Telephone Encounter (Signed)
Let's stop that med. I am going to call in a new one to take in the evening. TY.

## 2019-03-11 ENCOUNTER — Other Ambulatory Visit: Payer: Self-pay | Admitting: Family Medicine

## 2019-03-12 ENCOUNTER — Encounter (HOSPITAL_BASED_OUTPATIENT_CLINIC_OR_DEPARTMENT_OTHER): Payer: Self-pay | Admitting: *Deleted

## 2019-03-12 ENCOUNTER — Emergency Department (HOSPITAL_BASED_OUTPATIENT_CLINIC_OR_DEPARTMENT_OTHER)
Admission: EM | Admit: 2019-03-12 | Discharge: 2019-03-12 | Disposition: A | Discharge status: Home / Self Care | Payer: Medicare Other | Attending: Emergency Medicine | Admitting: Emergency Medicine

## 2019-03-12 ENCOUNTER — Other Ambulatory Visit: Payer: Self-pay

## 2019-03-12 DIAGNOSIS — N183 Chronic kidney disease, stage 3 (moderate): Secondary | ICD-10-CM | POA: Diagnosis not present

## 2019-03-12 DIAGNOSIS — I2510 Atherosclerotic heart disease of native coronary artery without angina pectoris: Secondary | ICD-10-CM | POA: Insufficient documentation

## 2019-03-12 DIAGNOSIS — Z7984 Long term (current) use of oral hypoglycemic drugs: Secondary | ICD-10-CM | POA: Insufficient documentation

## 2019-03-12 DIAGNOSIS — Z79899 Other long term (current) drug therapy: Secondary | ICD-10-CM | POA: Diagnosis not present

## 2019-03-12 DIAGNOSIS — M79605 Pain in left leg: Secondary | ICD-10-CM | POA: Insufficient documentation

## 2019-03-12 DIAGNOSIS — Z951 Presence of aortocoronary bypass graft: Secondary | ICD-10-CM | POA: Diagnosis not present

## 2019-03-12 DIAGNOSIS — Z7982 Long term (current) use of aspirin: Secondary | ICD-10-CM | POA: Diagnosis not present

## 2019-03-12 DIAGNOSIS — I130 Hypertensive heart and chronic kidney disease with heart failure and stage 1 through stage 4 chronic kidney disease, or unspecified chronic kidney disease: Secondary | ICD-10-CM | POA: Diagnosis not present

## 2019-03-12 DIAGNOSIS — Z87891 Personal history of nicotine dependence: Secondary | ICD-10-CM | POA: Diagnosis not present

## 2019-03-12 DIAGNOSIS — I5022 Chronic systolic (congestive) heart failure: Secondary | ICD-10-CM | POA: Insufficient documentation

## 2019-03-12 DIAGNOSIS — M79604 Pain in right leg: Secondary | ICD-10-CM | POA: Diagnosis not present

## 2019-03-12 DIAGNOSIS — E1122 Type 2 diabetes mellitus with diabetic chronic kidney disease: Secondary | ICD-10-CM | POA: Diagnosis not present

## 2019-03-12 DIAGNOSIS — R202 Paresthesia of skin: Secondary | ICD-10-CM | POA: Diagnosis present

## 2019-03-12 LAB — CBC WITH DIFFERENTIAL/PLATELET
Abs Immature Granulocytes: 0.01 10*3/uL (ref 0.00–0.07)
Basophils Absolute: 0 10*3/uL (ref 0.0–0.1)
Basophils Relative: 1 %
Eosinophils Absolute: 0.2 10*3/uL (ref 0.0–0.5)
Eosinophils Relative: 5 %
HCT: 36 % — ABNORMAL LOW (ref 39.0–52.0)
Hemoglobin: 11.3 g/dL — ABNORMAL LOW (ref 13.0–17.0)
Immature Granulocytes: 0 %
Lymphocytes Relative: 18 %
Lymphs Abs: 0.9 10*3/uL (ref 0.7–4.0)
MCH: 28.4 pg (ref 26.0–34.0)
MCHC: 31.4 g/dL (ref 30.0–36.0)
MCV: 90.5 fL (ref 80.0–100.0)
MONOS PCT: 15 %
Monocytes Absolute: 0.7 10*3/uL (ref 0.1–1.0)
Neutro Abs: 2.9 10*3/uL (ref 1.7–7.7)
Neutrophils Relative %: 61 %
Platelets: 226 10*3/uL (ref 150–400)
RBC: 3.98 MIL/uL — ABNORMAL LOW (ref 4.22–5.81)
RDW: 16.7 % — ABNORMAL HIGH (ref 11.5–15.5)
WBC: 4.7 10*3/uL (ref 4.0–10.5)
nRBC: 0 % (ref 0.0–0.2)

## 2019-03-12 LAB — BASIC METABOLIC PANEL
Anion gap: 10 (ref 5–15)
BUN: 45 mg/dL — AB (ref 8–23)
CO2: 23 mmol/L (ref 22–32)
CREATININE: 1.77 mg/dL — AB (ref 0.61–1.24)
Calcium: 8.5 mg/dL — ABNORMAL LOW (ref 8.9–10.3)
Chloride: 99 mmol/L (ref 98–111)
GFR calc Af Amer: 46 mL/min — ABNORMAL LOW (ref >60)
GFR calc non Af Amer: 40 mL/min — ABNORMAL LOW (ref >60)
Glucose, Bld: 147 mg/dL — ABNORMAL HIGH (ref 70–99)
Potassium: 3.6 mmol/L (ref 3.5–5.1)
Sodium: 132 mmol/L — ABNORMAL LOW (ref 135–145)

## 2019-03-12 LAB — CBG MONITORING, ED: Glucose-Capillary: 134 mg/dL — ABNORMAL HIGH (ref 70–99)

## 2019-03-12 MED ORDER — ACETAMINOPHEN 500 MG PO TABS
1000.0000 mg | ORAL_TABLET | Freq: Once | ORAL | Status: AC
  Administered 2019-03-12: 1000 mg via ORAL
  Filled 2019-03-12: qty 2

## 2019-03-12 NOTE — ED Provider Notes (Signed)
[facility] EMERGENCY DEPARTMENT Provider Note  CSN: [id number] Arrival date & time: 03/12/19 0626  Chief Complaint(s) stabbing/tingling type pain to bilateral legs  HPI Patrick Stewart is a 63 y.o. male with an extensive past medical history listed below including CHF with a last EF of 30 to 35% (in July 2019) on Entresto and torsemide, diabetes who presents to the emergency department with 3 days of intermittent stabbing and tingling pain to bilateral lower and upper extremities.  Pains come on every several minutes to hours.  No apparent trigger.  No alleviating or aggravating factors.  He denies any trauma.  States that he is compliant with all his medication.  Endorses peripheral edema but states that has improved over the past several days.  Denies any associated chest pain or shortness of breath.  No fevers or chills.  HPI  Past Medical History Past Medical History:  Diagnosis Date  . CAD (coronary artery disease) 2010   3v CABG  . CHF (congestive heart failure) ([place]) 01/26/2012  . CKD (chronic kidney disease) 02/19/2012  . COPD with chronic bronchitis ([place]) 08/26/2016  . Diabetes mellitus type 2, uncontrolled ([place]) 01/26/2012  . Elevated liver function tests 01/26/2012  . Gout 01/26/2012  . Hx of CABG 2010   RCA Stent 2008, CABG x 2 [facility] 2010  . Hypercholesterolemia 07/08/2016  . Hypertension 09/04/2014  . Ischemic cardiomyopathy 11/15/2014  . Kidney stone 10/25/2015  . Lipid disorder 01/26/2012  . Noncompliance 10/18/2018  . NSTEMI (non-ST elevated myocardial infarction) ([place]) 07/08/2016  . ST elevation myocardial infarction (STEMI) ([place]) 2010  . Unstable angina ([place]) 11/14/2014   Patient Active Problem List   Diagnosis Date Noted  . Diabetic polyneuropathy associated with type 2 diabetes mellitus ([place]) 02/15/2019  . Noncompliance 10/18/2018  . Acute on chronic diastolic CHF (congestive heart failure) ([place]) 07/16/2018  . Noncompliance by refusing intervention  or support 04/06/2018  . Uncontrolled type 2 diabetes mellitus with hyperglycemia ([place]) 01/31/2018  . Hypertensive urgency 07/12/2017  . LBBB (left bundle branch block) 04/26/2017  . COPD with chronic bronchitis ([place]) 08/26/2016  . Kidney stone on right side 10/25/2015  . CKD (chronic kidney disease), stage III ([place]) 10/13/2015  . Gout 10/13/2015  . CAD Status post CABG 2 SVG to OM 1, LIMA to mid LAD: 10/13/2015  . Cardiomyopathy, ischemic, chronic systolic CHF 94/85/4627  . Essential hypertension 09/04/2014  . Benign prostatic hyperplasia with lower urinary tract symptoms 01/24/2013  . Peyronie's disease 12/18/2012  . Chronic joint pain 06/22/2012  . History of tobacco abuse 01/26/2012  . Inguinal hernia, right 01/26/2012   Home Medication(s) Prior to Admission medications   Medication Sig Start Date End Date Taking? Authorizing Provider  albuterol (PROVENTIL HFA;VENTOLIN HFA) 108 (90 Base) MCG/ACT inhaler Inhale 2 puffs into the lungs every 6 (six) hours as needed for wheezing or shortness of breath.    [provider]  albuterol (PROVENTIL) (2.5 MG/3ML) 0.083% nebulizer solution INHALE CONTENTS OF 1 VIAL VIA NEBULIZER EVERY 6 HOURS AS NEEDED FOR WHEEZING/SHORTNESS OF BREATH 11/11/18   Wendling, Crosby Oyster, DO  allopurinol (ZYLOPRIM) 300 MG tablet Take 1 tablet (300 mg total) by mouth 2 (two) times daily. 02/15/19   Shelda Pal, DO  amitriptyline (ELAVIL) 10 MG tablet Take 1 tablet (10 mg total) by mouth at bedtime. 02/16/19   Shelda Pal, DO  aspirin EC 81 MG EC tablet Take 1 tablet (81 mg total) by mouth daily. 04/16/17   Conrad [place], NP  Aspirin-Salicylamide-Caffeine (BC HEADACHE POWDER PO) Take 1 packet by mouth daily as needed (headache/pain).    [provider]  atorvastatin (LIPITOR) 80 MG tablet Take 1 tablet (80 mg total) by mouth daily. 08/26/16   Shelda Pal, DO  carvedilol (COREG) 12.5 MG tablet Take 1 tablet (12.5 mg  total) by mouth 2 (two) times daily with a meal. 01/13/19   Wendling, Crosby Oyster, DO  celecoxib (CELEBREX) 100 MG capsule Take 1 capsule (100 mg total) by mouth 2 (two) times daily. 02/15/19   Wendling, Crosby Oyster, DO  COLCRYS 0.6 MG tablet TAKE 1 TABLET BY MOUTH TWICE A DAY Patient taking differently: TAKE 1 TABLET BY MOUTH TWICE A DAY AS NEEDED FOR GOUT ATTACK 01/28/18   Wendling, Crosby Oyster, DO  digoxin (LANOXIN) 0.125 MG tablet TAKE 1 TABLET BY MOUTH EVERY DAY 10/19/18   Clegg, Amy D, NP  diphenhydrAMINE (BENADRYL) 25 MG tablet Take 25 mg by mouth daily as needed for itching.    [provider]  glucose blood test strip Use as instructed 08/26/16   Shelda Pal, DO  Investigational - Study Medication Take 1 tablet by mouth 2 (two) times daily. Study name: Galactic HF Study Additional study details: Omecamtiv Mecarbil or Placebo Patient taking differently: Take 1 tablet by mouth 2 (two) times daily. Study name: Galactic HF Study Additional study details: Omecamtiv Mecarbil or Placebo Managed by Dr. Aundra Dubin [facility] RN) 04/15/17   Larey Dresser, MD  Lancets (FREESTYLE) lancets Use to check sugars daily 08/26/16   Shelda Pal, DO  Lidocaine 1.8 % PTCH Apply 1 patch topically daily as needed (pain). 10/23/18   Nils Flack, Mina A, PA-C  metFORMIN (GLUCOPHAGE) 500 MG tablet Take 1 tablet (500 mg total) by mouth 2 (two) times daily with a meal. 01/03/18   Wendling, Crosby Oyster, DO  sacubitril-valsartan (ENTRESTO) 49-51 MG Take 1 tablet by mouth 2 (two) times daily. 02/03/18   Camnitz, Ocie Doyne, MD  spironolactone (ALDACTONE) 25 MG tablet Take 1 tablet (25 mg total) by mouth daily. 07/20/18   Shelda Pal, DO  tiZANidine (ZANAFLEX) 4 MG capsule Take 1 capsule (4 mg total) by mouth 3 (three) times daily as needed for muscle spasms. 11/11/18   Shelda Pal, DO  torsemide (DEMADEX) 20 MG tablet TAKE 2 TABLETS (40 MG TOTAL) BY MOUTH DAILY. 07/18/18    Shelda Pal, DO                                                                                                                                    Past Surgical History Past Surgical History:  Procedure Laterality Date  . BIV ICD INSERTION CRT-D N/A 10/21/2017   Procedure: BIV ICD INSERTION CRT-D;  Surgeon: Constance Haw, MD;  Location: [facility];  Service: Cardiovascular;  Laterality: N/A;  . CORONARY ARTERY BYPASS GRAFT  2010  . KNEE SURGERY    .  RIGHT/LEFT HEART CATH AND CORONARY ANGIOGRAPHY N/A 04/15/2017   Procedure: Right/Left Heart Cath and Coronary Angiography;  Surgeon: Larey Dresser, MD;  Location: [facility];  Service: Cardiovascular;  Laterality: N/A;  . ROTATOR CUFF REPAIR    . WRIST SURGERY     Family History Family History  Problem Relation Age of Onset  . Hypertension Mother   . Diabetes Mother   . Hyperlipidemia Mother   . Kidney failure Mother   . Emphysema Father     Social History Social History   Tobacco Use  . Smoking status: Former Smoker    Packs/day: 0.50    Years: 30.00    Pack years: 15.00    Last attempt to quit: 07/10/2016    Years since quitting: 2.6  . Smokeless tobacco: Never Used  Substance Use Topics  . Alcohol use: No  . Drug use: No   Allergies Hydrocodone and Tramadol  Review of Systems Review of Systems All other systems are reviewed and are negative for acute change except as noted in the HPI  Physical Exam Vital Signs  I have reviewed the triage vital signs BP (!) 165/97 (BP Location: Right Arm)   Pulse 98   Temp 97.7 F (36.5 C) (Oral)   Resp 20   SpO2 98%   Physical Exam Vitals signs reviewed.  Constitutional:      General: He is not in acute distress.    Appearance: He is well-developed. He is not diaphoretic.  HENT:     Head: Normocephalic and atraumatic.     Jaw: No trismus.     Right Ear: External ear normal.     Left Ear: External ear normal.     Nose: Nose normal.   Eyes:     General: No scleral icterus.    Conjunctiva/sclera: Conjunctivae normal.  Neck:     Musculoskeletal: Normal range of motion.     Trachea: Phonation normal.  Cardiovascular:     Rate and Rhythm: Normal rate and regular rhythm.  Pulmonary:     Effort: Pulmonary effort is normal. No respiratory distress.     Breath sounds: No stridor.  Abdominal:     General: There is no distension.  Musculoskeletal: Normal range of motion.     Comments: Symmetric 3+ bilateral lower extremity pitting edema. No erythema.  Neurological:     Mental Status: He is alert and oriented to person, place, and time.  Psychiatric:        Behavior: Behavior normal.     ED Results and Treatments Labs (all labs ordered are listed, but only abnormal results are displayed) Labs Reviewed  CBC WITH DIFFERENTIAL/PLATELET - Abnormal; Notable for the following components:      Result Value   RBC 3.98 (*)    Hemoglobin 11.3 (*)    HCT 36.0 (*)    RDW 16.7 (*)    All other components within normal limits  BASIC METABOLIC PANEL - Abnormal; Notable for the following components:   Sodium 132 (*)    Glucose, Bld 147 (*)    BUN 45 (*)    Creatinine, Ser 1.77 (*)    Calcium 8.5 (*)    GFR calc non Af Amer 40 (*)    GFR calc Af Amer 46 (*)    All other components within normal limits  CBG MONITORING, ED - Abnormal; Notable for the following components:   Glucose-Capillary 134 (*)    All other components within normal limits  EKG  EKG Interpretation  Date/Time:    Ventricular Rate:    PR Interval:    QRS Duration:   QT Interval:    QTC Calculation:   R Axis:     Text Interpretation:        Radiology No results found. Pertinent labs & imaging results that were available during my care of the patient were reviewed by me and considered in my medical decision making (see chart for  details).  Medications Ordered in ED Medications  acetaminophen (TYLENOL) tablet 1,000 mg (1,000 mg Oral Given 03/12/19 0630)                                                                                                                                    Procedures Procedures  (including critical care time)  Medical Decision Making / ED Course I have reviewed the nursing notes for this encounter and the patient's prior records (if available in EHR or on provided paperwork).    Patient presents with intermittent tingling and stabbing pain in bilateral lower and upper extremities.  Worse in the lower extremities per patient.  No evidence of infection.  Patient does have peripheral edema but reports that it is improving.  Denies any chest pain or shortness of breath consistent with CHF exacerbation.  Patient has a history of diabetes.  Possibly worsening peripheral neuropathy.  Given the fact that he is on torsemide for CHF, will obtain screening labs to assess for electrolyte derangements.   Provided with tylenol in the interim.  Patient with mild hyponatremia.  No other significant electrolyte derangements.  Patient does have mild renal sufficiency likely secondary to the torsemide.  Similar to recent renal function tests.  Instructed to follow-up with PCP, which she is already scheduled for in 3 days.  The patient appears reasonably screened and/or stabilized for discharge and I doubt any other medical condition or other [facility] requiring further screening, evaluation, or treatment in the ED at this time prior to discharge.  The patient is safe for discharge with strict return precautions.    Final Clinical Impression(s) / ED Diagnoses Final diagnoses:  Pain in both lower extremities    Disposition: Discharge  Condition: Good  I have discussed the results, Dx and Tx plan with the patient who expressed understanding and agree(s) with the plan. Discharge instructions discussed at  great length. The patient was given strict return precautions who verbalized understanding of the instructions. No further questions at time of discharge.    ED Discharge Orders    None       Follow Up: [place] [place] [address] [place] [address] [phone]  Go on 03/15/2019 as scheduled, If symptoms do not improve or  worsen     This chart was dictated using voice recognition software.  Despite best efforts to proofread,  errors can occur which can change the documentation meaning.  Fatima Blank, MD 03/12/19 [phone]

## 2019-03-12 NOTE — Discharge Instructions (Addendum)
Discuss additional treatments for your neuropathy with your PCP. In the meantime, you can try using Arnica ointment or other topical creams for pain relief.

## 2019-03-12 NOTE — ED Notes (Signed)
ED Provider at bedside. 

## 2019-03-12 NOTE — ED Triage Notes (Signed)
C/o bilateral leg pain that started 3 days ago. Describes pain as sharp and states pain comes and goes. Has a hx of diabetes. Denies any other complaints. Has not taken anything for pain.

## 2019-03-14 DIAGNOSIS — R7989 Other specified abnormal findings of blood chemistry: Secondary | ICD-10-CM | POA: Diagnosis not present

## 2019-03-14 DIAGNOSIS — I10 Essential (primary) hypertension: Secondary | ICD-10-CM | POA: Diagnosis not present

## 2019-03-14 DIAGNOSIS — Z87891 Personal history of nicotine dependence: Secondary | ICD-10-CM | POA: Diagnosis not present

## 2019-03-14 DIAGNOSIS — G629 Polyneuropathy, unspecified: Secondary | ICD-10-CM | POA: Diagnosis not present

## 2019-03-15 ENCOUNTER — Other Ambulatory Visit: Payer: Self-pay

## 2019-03-15 ENCOUNTER — Encounter: Payer: Self-pay | Admitting: Family Medicine

## 2019-03-15 ENCOUNTER — Ambulatory Visit (INDEPENDENT_AMBULATORY_CARE_PROVIDER_SITE_OTHER): Payer: Medicare Other | Admitting: Family Medicine

## 2019-03-15 VITALS — BP 146/100 | HR 87 | Temp 98.0°F | Ht 70.0 in | Wt 184.0 lb

## 2019-03-15 DIAGNOSIS — M1A9XX0 Chronic gout, unspecified, without tophus (tophi): Secondary | ICD-10-CM | POA: Diagnosis not present

## 2019-03-15 DIAGNOSIS — I10 Essential (primary) hypertension: Secondary | ICD-10-CM | POA: Diagnosis not present

## 2019-03-15 DIAGNOSIS — E1142 Type 2 diabetes mellitus with diabetic polyneuropathy: Secondary | ICD-10-CM | POA: Diagnosis not present

## 2019-03-15 LAB — URIC ACID: Uric Acid, Serum: 11.1 mg/dL — ABNORMAL HIGH (ref 4.0–7.8)

## 2019-03-15 MED ORDER — TORSEMIDE 20 MG PO TABS: 40.0000 mg | ORAL_TABLET | Freq: Every day | ORAL | 2 refills | Status: DC

## 2019-03-15 MED ORDER — CARVEDILOL 25 MG PO TABS: 25.0000 mg | ORAL_TABLET | Freq: Two times a day (BID) | ORAL | 3 refills | Status: DC

## 2019-03-15 MED ORDER — METHYLPREDNISOLONE ACETATE 80 MG/ML IJ SUSP
80.0000 mg | Freq: Once | INTRAMUSCULAR | Status: AC
  Administered 2019-03-15: 80 mg via INTRAMUSCULAR

## 2019-03-15 MED ORDER — GABAPENTIN 300 MG PO CAPS: 300.0000 mg | ORAL_CAPSULE | Freq: Two times a day (BID) | ORAL | 2 refills | Status: DC

## 2019-03-15 NOTE — Patient Instructions (Addendum)
Give [place] 2-3 business days to get the results of your labs back.   Keep the diet clean and stay active.  Keep checking your blood pressure at home.   Let us know if you need anything.

## 2019-03-15 NOTE — Progress Notes (Signed)
   Patrick Stewart is a 63 y.o. male here for gout.  Currently being treated with Allopurinol. There have been compliance issues with his medications. The joint(s) affected include: L knee Most recent uric acid level is: 9.5 Reports compliance. Side effects of medications: None Is avoiding seafood, sweet/sugary beverages, alcohol, and red meats.  Hypertension Patient presents for hypertension follow up. He does monitor home blood pressures. Blood pressures ranging on average from 140's/80's. He is compliant with medications- Coreg 12.5 mg bid, Entresto, Aldactone 25 mg/d. Patient has these side effects of medication: none He is adhering to a healthy diet overall. Exercise: none  Dx'd w diabetic neuropathy. Started on SNRI then changed to TCA. Went to ED and started on gabapentin. Did well. No AE's. Would like refill.   ROS:  MSK: +R knee pain Skin: No redness  Past Medical History:  Diagnosis Date  . CAD (coronary artery disease) 2010   3v CABG  . CHF (congestive heart failure) ([place]) 01/26/2012  . CKD (chronic kidney disease) 02/19/2012  . COPD with chronic bronchitis ([place]) 08/26/2016  . Diabetes mellitus type 2, uncontrolled ([place]) 01/26/2012  . Elevated liver function tests 01/26/2012  . Gout 01/26/2012  . Hx of CABG 2010   RCA Stent 2008, CABG x 2 [facility] 2010  . Hypercholesterolemia 07/08/2016  . Hypertension 09/04/2014  . Ischemic cardiomyopathy 11/15/2014  . Kidney stone 10/25/2015  . Lipid disorder 01/26/2012  . Noncompliance 10/18/2018  . NSTEMI (non-ST elevated myocardial infarction) ([place]) 07/08/2016  . ST elevation myocardial infarction (STEMI) ([place]) 2010  . Unstable angina ([place]) 11/14/2014   Family History  Problem Relation Age of Onset  . Hypertension Mother   . Diabetes Mother   . Hyperlipidemia Mother   . Kidney failure Mother   . Emphysema Father      BP (!) 146/100 (BP Location: Left Arm, Patient Position: Sitting, Cuff Size: Normal)   Pulse 87    Temp 98 F (36.7 C) (Oral)   Ht 5\' 10"  (1.778 m)   Wt 184 lb (83.5 kg)   SpO2 93%   BMI 26.40 kg/m  Gen: Awake, alert, appears stated age Neck: No masses or asymmetry Heart: RRR, no bruits Lungs: CTAB, no accessory muscle use MSK: no swelling or TTP, normal gait Skin: No erythema, mild warmth over L knee Psych: Age appropriate judgment and insight, nml mood and affect  Chronic gout without tophus, unspecified cause, unspecified site - Plan: Uric acid, methylPREDNISolone acetate (DEPO-MEDROL) injection 80 mg  Diabetic polyneuropathy associated with type 2 diabetes mellitus (HCC) - Plan: gabapentin (NEURONTIN) 300 MG capsule  Essential hypertension - Plan: carvedilol (COREG) 25 MG tablet  Orders as above. Reminded to avoid foods like alcohol, sweet beverages, red meat, lunch meat, sea food. Refill gabapentin. Increase BB from 12.5 mg bid to 25 mg bid. Counseled on diet and exercise.  Encouraged compliance. F/u 1 mo. The patient voiced understanding and agreement to the plan.  [place], DO 03/15/19 8:31 AM

## 2019-03-30 ENCOUNTER — Other Ambulatory Visit: Payer: Self-pay | Admitting: Family Medicine

## 2019-03-30 DIAGNOSIS — M1A9XX0 Chronic gout, unspecified, without tophus (tophi): Secondary | ICD-10-CM

## 2019-04-10 ENCOUNTER — Other Ambulatory Visit: Payer: Self-pay | Admitting: Family Medicine

## 2019-04-10 DIAGNOSIS — E1142 Type 2 diabetes mellitus with diabetic polyneuropathy: Secondary | ICD-10-CM

## 2019-04-12 ENCOUNTER — Other Ambulatory Visit: Payer: Self-pay

## 2019-04-12 ENCOUNTER — Ambulatory Visit (INDEPENDENT_AMBULATORY_CARE_PROVIDER_SITE_OTHER): Payer: Medicare Other | Admitting: Family Medicine

## 2019-04-12 ENCOUNTER — Encounter: Payer: Self-pay | Admitting: Family Medicine

## 2019-04-12 DIAGNOSIS — M1A9XX0 Chronic gout, unspecified, without tophus (tophi): Secondary | ICD-10-CM

## 2019-04-12 DIAGNOSIS — E1142 Type 2 diabetes mellitus with diabetic polyneuropathy: Secondary | ICD-10-CM

## 2019-04-12 MED ORDER — GABAPENTIN 600 MG PO TABS: 600.0000 mg | ORAL_TABLET | Freq: Two times a day (BID) | ORAL | 5 refills | Status: DC

## 2019-04-12 MED ORDER — SPIRONOLACTONE 25 MG PO TABS: 25.0000 mg | ORAL_TABLET | Freq: Every day | ORAL | 6 refills | Status: DC

## 2019-04-12 NOTE — Progress Notes (Addendum)
Chief Complaint  Patient presents with  . Follow-up    Patrick Stewart is a 63 y.o. male here for gout. Due to outbreak, we tried interacting via web portal for an electronic face-to-face visit. Due to technical difficulties, this was transitioned to a telephone call. I verified patient's ID using 2 identifiers.   Currently being treated with allopurinol 300 mg bid. The joint(s) affected include: L knee Most recent uric acid level is: 11.1 (was taking 300 mg/d) Reports compliance since last visit Side effects of medications: None Is avoiding seafood, sweet/sugary beverages, alcohol, and red meats.  Neuropathy feels better on gabapentin 600 mg bid. No AE's, taking daily. He is requesting a higher dosage so he is taking fewer pills.   ROS:  MSK: No current joint pain Skin: No redness  Past Medical History:  Diagnosis Date  . CAD (coronary artery disease) 2010   3v CABG  . CHF (congestive heart failure) ([place]) 01/26/2012  . CKD (chronic kidney disease) 02/19/2012  . COPD with chronic bronchitis ([place]) 08/26/2016  . Diabetes mellitus type 2, uncontrolled ([place]) 01/26/2012  . Elevated liver function tests 01/26/2012  . Gout 01/26/2012  . Hx of CABG 2010   RCA Stent 2008, CABG x 2 [facility] 2010  . Hypercholesterolemia 07/08/2016  . Hypertension 09/04/2014  . Ischemic cardiomyopathy 11/15/2014  . Kidney stone 10/25/2015  . Lipid disorder 01/26/2012  . Noncompliance 10/18/2018  . NSTEMI (non-ST elevated myocardial infarction) ([place]) 07/08/2016  . ST elevation myocardial infarction (STEMI) ([place]) 2010  . Unstable angina ([place]) 11/14/2014   No conversational dyspnea Age appropriate judgment and insight Nml affect and mood  Chronic gout without tophus, unspecified cause, unspecified site - Plan: Uric acid  Diabetic polyneuropathy associated with type 2 diabetes mellitus ([place]) - Plan: gabapentin (NEURONTIN) 600 MG tablet  Orders as above.  Gout: uncontrolled; Reminded to avoid foods like  alcohol, sweet beverages, red meat, lunch meat, sea food. Cont allopurinol at current dosage pending urate. Total time spent: 11 min Neuropathy: controlled. Refill gabapentin, OK with the self titrated dosage change he made. F/u in 1 mo to reck gout. The patient voiced understanding and agreement to the plan.  [place], DO 04/12/19 7:46 AM

## 2019-04-18 ENCOUNTER — Ambulatory Visit (INDEPENDENT_AMBULATORY_CARE_PROVIDER_SITE_OTHER): Payer: Medicare Other | Admitting: *Deleted

## 2019-04-18 ENCOUNTER — Other Ambulatory Visit: Payer: Self-pay

## 2019-04-18 DIAGNOSIS — I255 Ischemic cardiomyopathy: Secondary | ICD-10-CM | POA: Diagnosis not present

## 2019-04-18 DIAGNOSIS — I5022 Chronic systolic (congestive) heart failure: Secondary | ICD-10-CM

## 2019-04-18 LAB — CUP PACEART REMOTE DEVICE CHECK
Battery Remaining Longevity: 97 mo
Battery Voltage: 2.9 V
Brady Statistic AP VP Percent: 12.28 %
Brady Statistic AP VS Percent: 0.25 %
Brady Statistic AS VP Percent: 84.54 %
Brady Statistic AS VS Percent: 2.94 %
Brady Statistic RA Percent Paced: 12.34 %
Brady Statistic RV Percent Paced: 11.59 %
Date Time Interrogation Session: 20200421073526
HighPow Impedance: 51 Ohm
Implantable Lead Implant Date: 20181025
Implantable Lead Implant Date: 20181025
Implantable Lead Implant Date: 20181025
Implantable Lead Location: 753858
Implantable Lead Location: 753859
Implantable Lead Location: 753860
Implantable Lead Model: 4398
Implantable Lead Model: 5076
Implantable Pulse Generator Implant Date: 20181025
Lead Channel Impedance Value: 180 Ohm
Lead Channel Impedance Value: 184.154
Lead Channel Impedance Value: 190 Ohm
Lead Channel Impedance Value: 194.634
Lead Channel Impedance Value: 194.634
Lead Channel Impedance Value: 285 Ohm
Lead Channel Impedance Value: 342 Ohm
Lead Channel Impedance Value: 380 Ohm
Lead Channel Impedance Value: 380 Ohm
Lead Channel Impedance Value: 380 Ohm
Lead Channel Impedance Value: 399 Ohm
Lead Channel Impedance Value: 399 Ohm
Lead Channel Impedance Value: 456 Ohm
Lead Channel Impedance Value: 646 Ohm
Lead Channel Impedance Value: 646 Ohm
Lead Channel Impedance Value: 665 Ohm
Lead Channel Impedance Value: 665 Ohm
Lead Channel Impedance Value: 665 Ohm
Lead Channel Pacing Threshold Amplitude: 0.625 V
Lead Channel Pacing Threshold Amplitude: 0.75 V
Lead Channel Pacing Threshold Amplitude: 1.375 V
Lead Channel Pacing Threshold Pulse Width: 0.4 ms
Lead Channel Pacing Threshold Pulse Width: 0.4 ms
Lead Channel Pacing Threshold Pulse Width: 0.5 ms
Lead Channel Sensing Intrinsic Amplitude: 12.125 mV
Lead Channel Sensing Intrinsic Amplitude: 12.125 mV
Lead Channel Sensing Intrinsic Amplitude: 3.25 mV
Lead Channel Sensing Intrinsic Amplitude: 3.25 mV
Lead Channel Setting Pacing Amplitude: 2 V
Lead Channel Setting Pacing Amplitude: 2 V
Lead Channel Setting Pacing Amplitude: 2.5 V
Lead Channel Setting Pacing Pulse Width: 0.4 ms
Lead Channel Setting Pacing Pulse Width: 0.5 ms
Lead Channel Setting Sensing Sensitivity: 0.3 mV

## 2019-04-19 ENCOUNTER — Other Ambulatory Visit: Payer: Medicare Other

## 2019-04-20 ENCOUNTER — Telehealth: Payer: Self-pay | Admitting: *Deleted

## 2019-04-20 NOTE — Telephone Encounter (Signed)
-----   Message from Will Meredith Leeds, MD sent at 04/19/2019 10:42 AM EDT ----- Abnormal device interrogation reviewed.  Lead parameters and battery status stable.  Volume status elevated. Increase torsemide to 40 mg BID for 3 days. Enroll in HCM clinic.

## 2019-04-20 NOTE — Telephone Encounter (Signed)
lmtcb

## 2019-04-25 NOTE — Progress Notes (Signed)
Remote ICD transmission.   

## 2019-05-03 NOTE — Telephone Encounter (Signed)
No answer, no voicemail.

## 2019-05-04 ENCOUNTER — Other Ambulatory Visit: Payer: Self-pay | Admitting: Family Medicine

## 2019-05-04 DIAGNOSIS — E1142 Type 2 diabetes mellitus with diabetic polyneuropathy: Secondary | ICD-10-CM

## 2019-05-08 ENCOUNTER — Other Ambulatory Visit: Payer: Self-pay

## 2019-05-08 ENCOUNTER — Encounter: Payer: Self-pay | Admitting: Family Medicine

## 2019-05-08 ENCOUNTER — Ambulatory Visit (INDEPENDENT_AMBULATORY_CARE_PROVIDER_SITE_OTHER): Payer: Medicare Other | Admitting: Family Medicine

## 2019-05-08 ENCOUNTER — Telehealth: Payer: Self-pay

## 2019-05-08 DIAGNOSIS — E1142 Type 2 diabetes mellitus with diabetic polyneuropathy: Secondary | ICD-10-CM | POA: Diagnosis not present

## 2019-05-08 NOTE — Progress Notes (Signed)
Musculoskeletal Exam  Patient: Patrick Stewart DOB: February 16, 1956  DOS: 05/08/2019  SUBJECTIVE:  Chief Complaint:   Chief Complaint  Patient presents with  . Foot Pain    Patrick Stewart is a 63 y.o.  male for evaluation and treatment of R foot pain. Due to COVID-19 pandemic, we are interacting via telephone. I verified patient's ID using 2 identifiers. Patient agreed to proceed with visit via this method. Patient is at home, I am at office. Patient and I are present for visit.   Onset:  4 days ago. No inj or change in activity.  Location: feet/legs Character:  burning, shooting and stabbing  Progression of issue:  Worsened over past 4 d Associated symptoms: none Takes 1st cap around 7-8 AM. Worse pain around 5-6 PM. Treatment: to date has been: gabapentin 600 mg bid had been helpful, no so recently.   Neurovascular symptoms: no weakness  ROS: Musculoskeletal/Extremities: +ft pain  Past Medical History:  Diagnosis Date  . CAD (coronary artery disease) 2010   3v CABG  . CHF (congestive heart failure) ([place]) 01/26/2012  . CKD (chronic kidney disease) 02/19/2012  . COPD with chronic bronchitis ([place]) 08/26/2016  . Diabetes mellitus type 2, uncontrolled ([place]) 01/26/2012  . Elevated liver function tests 01/26/2012  . Gout 01/26/2012  . Hx of CABG 2010   RCA Stent 2008, CABG x 2 [facility] 2010  . Hypercholesterolemia 07/08/2016  . Hypertension 09/04/2014  . Ischemic cardiomyopathy 11/15/2014  . Kidney stone 10/25/2015  . Lipid disorder 01/26/2012  . Noncompliance 10/18/2018  . NSTEMI (non-ST elevated myocardial infarction) ([place]) 07/08/2016  . ST elevation myocardial infarction (STEMI) ([place]) 2010  . Unstable angina ([place]) 11/14/2014    Objective: No conversational dyspnea Age appropriate judgment and insight Nml affect and mood  Assessment:  Diabetic polyneuropathy associated with type 2 diabetes mellitus ([place]) - Plan: gabapentin (NEURONTIN) 600 MG tablet  Plan: Change from BID  to TID. Wonder if it is wearing off due to relatively short 1/2-life. I have an appt with him on Fri, will adjust dosage if still having issues.  Total time spent: 6 min F/u as originally scheduled in 4 d. The patient voiced understanding and agreement to the plan.   [place], DO 05/08/19  1:46 PM

## 2019-05-08 NOTE — Telephone Encounter (Signed)
Called and scheduled virtual today.

## 2019-05-08 NOTE — Telephone Encounter (Signed)
Sched visit to discuss further please.

## 2019-05-08 NOTE — Telephone Encounter (Signed)
Follow up call made to patient. States he has stabbing pain in his R foot and ankle. States sometime it is both feet but mostly Right. States this happens mostly at night but sometimes during the day.Wants to know if he needs referral.

## 2019-05-08 NOTE — Telephone Encounter (Signed)
Copied from [place] [phone]. Topic: General - Other >> May 08, 2019  7:12 AM Carolyn Stare wrote:  Pt call to say that his diabetic pain is getting worse and is asking if he need to see a specialist

## 2019-05-12 ENCOUNTER — Telehealth: Payer: Self-pay | Admitting: Family Medicine

## 2019-05-12 ENCOUNTER — Encounter: Payer: Self-pay | Admitting: Family Medicine

## 2019-05-12 ENCOUNTER — Other Ambulatory Visit: Payer: Self-pay | Admitting: Family Medicine

## 2019-05-12 ENCOUNTER — Other Ambulatory Visit: Payer: Self-pay

## 2019-05-12 ENCOUNTER — Ambulatory Visit (INDEPENDENT_AMBULATORY_CARE_PROVIDER_SITE_OTHER): Payer: Medicare Other | Admitting: Family Medicine

## 2019-05-12 DIAGNOSIS — I10 Essential (primary) hypertension: Secondary | ICD-10-CM

## 2019-05-12 DIAGNOSIS — E1165 Type 2 diabetes mellitus with hyperglycemia: Secondary | ICD-10-CM | POA: Diagnosis not present

## 2019-05-12 DIAGNOSIS — E1142 Type 2 diabetes mellitus with diabetic polyneuropathy: Secondary | ICD-10-CM | POA: Diagnosis not present

## 2019-05-12 DIAGNOSIS — M1A9XX0 Chronic gout, unspecified, without tophus (tophi): Secondary | ICD-10-CM

## 2019-05-12 MED ORDER — AMITRIPTYLINE HCL 25 MG PO TABS: 25.0000 mg | ORAL_TABLET | Freq: Every day | ORAL | 3 refills | Status: DC

## 2019-05-12 MED ORDER — GLIPIZIDE 5 MG PO TABS: 5.0000 mg | ORAL_TABLET | Freq: Every day | ORAL | 2 refills | Status: AC

## 2019-05-12 NOTE — Progress Notes (Signed)
Subjective:  CC: DM, HTN, gout, neuropathy  Patrick Stewart is a 63 y.o. male here for follow-up of diabetes. Due to COVID-19 pandemic, we are interacting via telephone. I verified patient's ID using 2 identifiers. Patient agreed to proceed with visit via this method. Patient is at home, I am at office. Patient and I are present for visit.   Patrick Stewart does not monitor his blood sugars.  Medications include: glipizide 5 mg/d Exercise: None  Diet: Fair +neuropathy; bid gabapentin 600 mg was helpful, then stopped working. We increased to TID but he noticed no improvement. Wants to see specialist.  Hypertension Patient presents for hypertension follow up. He does not monitor home blood pressures. He is compliant with medications. Patient has these side effects of medication: none He is sometimes adhering to a healthy diet overall. Exercise: none  Hx of uncontrolled gout. He is noncompliant with his allopurinol, should be on 300 mg bid.  Last uric acid was 11.1. We suggested he see rheum, but he wanted to see if he could control it by taking his medication routinely. He gets freq flares in his L knee, none since taking twice daily He does adhere to a gout friendly diet.  Past Medical History:  Diagnosis Date  . CAD (coronary artery disease) 2010   3v CABG  . CHF (congestive heart failure) ([place]) 01/26/2012  . CKD (chronic kidney disease) 02/19/2012  . COPD with chronic bronchitis ([place]) 08/26/2016  . Diabetes mellitus type 2, uncontrolled ([place]) 01/26/2012  . Elevated liver function tests 01/26/2012  . Gout 01/26/2012  . Hx of CABG 2010   RCA Stent 2008, CABG x 2 [facility] 2010  . Hypercholesterolemia 07/08/2016  . Hypertension 09/04/2014  . Ischemic cardiomyopathy 11/15/2014  . Kidney stone 10/25/2015  . Lipid disorder 01/26/2012  . Noncompliance 10/18/2018  . NSTEMI (non-ST elevated myocardial infarction) ([place]) 07/08/2016  . ST elevation myocardial infarction (STEMI) ([place]) 2010  .  Unstable angina ([place]) 11/14/2014     Related testing: Date of retinal exam: Done Pneumovax: done  Review of Systems: Pulmonary:  No SOB Cardiovascular:  No chest pain  Objective:  No conversational dyspnea Age appropriate judgment and insight Nml affect and mood  Assessment:   Uncontrolled type 2 diabetes mellitus with hyperglycemia (HCC) - Plan: glipiZIDE (GLUCOTROL) 5 MG tablet, Hemoglobin A1c, Lipid panel, Comprehensive metabolic panel  Essential hypertension  Diabetic polyneuropathy associated with type 2 diabetes mellitus ([facility]) - Plan: Magnesium, TSH, T4, free, amitriptyline (ELAVIL) 25 MG tablet, gabapentin (NEURONTIN) 600 MG tablet, Ambulatory referral to Neurology  Chronic gout without tophus, unspecified cause, unspecified site   Plan:   1- Cont SU, ck A1c next week. Counseled on diet and exercise. 2- Cont meds 3- Go back to bid Neurontin. Start TCA. Has failed SNRI. Refer to Neuro. 4- Ck uric acid. Total time spent: 16 min  F/u in 6 mo if well controlled, labs next week. The patient voiced understanding and agreement to the plan.  [place], DO 05/12/19 7:16 AM

## 2019-05-12 NOTE — Telephone Encounter (Unsigned)
Copied from [place] [id number]. Topic: General - Other >> May 12, 2019  4:10 PM Oneta Rack wrote:  Relation to pt: self  Call back number: [phone] Pharmacy:  [id number] - [place], [place] - [address] AT [address]OSS [address] [phone] (Phone) [phone] (Fax)   Reason for call:  Patient states amitriptyline (ELAVIL) 25 MG tablet and nerve medication (patient unsure of name) stating medication has causes dizziness, fatigue and stated he needs a medication for pain, patient would like a follow up call, please advise

## 2019-05-15 ENCOUNTER — Telehealth: Payer: Self-pay

## 2019-05-15 NOTE — Telephone Encounter (Signed)
Copied from [place] [phone]. Topic: General - Inquiry >> May 15, 2019  8:44 AM Richardo Priest, NT wrote: Reason for CRM: Patient called in stating amitriptyline (ELAVIL) 25 MG tablet medication is not working and he would like an alternative.

## 2019-05-15 NOTE — Telephone Encounter (Signed)
Received refill request for medication he was having AE's from?

## 2019-05-16 ENCOUNTER — Telehealth: Payer: Self-pay

## 2019-05-16 ENCOUNTER — Encounter: Payer: Self-pay | Admitting: Neurology

## 2019-05-16 MED ORDER — DULOXETINE HCL 20 MG PO CPEP: 20.0000 mg | ORAL_CAPSULE | Freq: Every day | ORAL | 1 refills | Status: DC

## 2019-05-16 NOTE — Telephone Encounter (Signed)
The patient stated the amitryptiline causes problems for him---sleepiness for day/balance problems

## 2019-05-16 NOTE — Telephone Encounter (Signed)
Duloxetine called in. If he is still having issues by next Wednesday, I want to see him in the office to evaluate him. Ty.

## 2019-05-16 NOTE — Addendum Note (Signed)
Addended by: Ames Coupe on: 05/16/2019 08:54 AM   Modules accepted: Orders

## 2019-05-16 NOTE — Telephone Encounter (Signed)
Called informed the patient of medication sent in and to schedule appt if needed. He verbalized understanding.

## 2019-05-16 NOTE — Telephone Encounter (Signed)
Amitriptyline is causing sleepiness/and balance problems--he would like an alternative

## 2019-05-16 NOTE — Telephone Encounter (Signed)
Copied from [place] [id number]. Topic: General - Call Back - No Documentation >> May 15, 2019  5:36 PM Nils Flack wrote: Reason for CRM: pt says he is returning call\ Please cal back [phone]

## 2019-05-18 ENCOUNTER — Other Ambulatory Visit: Payer: Medicare Other

## 2019-05-29 ENCOUNTER — Other Ambulatory Visit: Payer: Medicare Other

## 2019-06-04 ENCOUNTER — Inpatient Hospital Stay (HOSPITAL_BASED_OUTPATIENT_CLINIC_OR_DEPARTMENT_OTHER)
Admission: EM | Admit: 2019-06-04 | Discharge: 2019-06-10 | DRG: 291 | Disposition: A | Discharge status: Home / Self Care | Payer: Medicare Other | Attending: Internal Medicine | Admitting: Internal Medicine

## 2019-06-04 ENCOUNTER — Encounter (HOSPITAL_BASED_OUTPATIENT_CLINIC_OR_DEPARTMENT_OTHER): Payer: Self-pay | Admitting: Student

## 2019-06-04 ENCOUNTER — Other Ambulatory Visit: Payer: Self-pay

## 2019-06-04 ENCOUNTER — Emergency Department (HOSPITAL_BASED_OUTPATIENT_CLINIC_OR_DEPARTMENT_OTHER): Payer: Medicare Other

## 2019-06-04 DIAGNOSIS — I5043 Acute on chronic combined systolic (congestive) and diastolic (congestive) heart failure: Secondary | ICD-10-CM | POA: Diagnosis present

## 2019-06-04 DIAGNOSIS — Z7984 Long term (current) use of oral hypoglycemic drugs: Secondary | ICD-10-CM

## 2019-06-04 DIAGNOSIS — Z955 Presence of coronary angioplasty implant and graft: Secondary | ICD-10-CM

## 2019-06-04 DIAGNOSIS — Z8349 Family history of other endocrine, nutritional and metabolic diseases: Secondary | ICD-10-CM

## 2019-06-04 DIAGNOSIS — I130 Hypertensive heart and chronic kidney disease with heart failure and stage 1 through stage 4 chronic kidney disease, or unspecified chronic kidney disease: Principal | ICD-10-CM | POA: Diagnosis present

## 2019-06-04 DIAGNOSIS — I272 Pulmonary hypertension, unspecified: Secondary | ICD-10-CM | POA: Diagnosis present

## 2019-06-04 DIAGNOSIS — Z791 Long term (current) use of non-steroidal anti-inflammatories (NSAID): Secondary | ICD-10-CM

## 2019-06-04 DIAGNOSIS — Z841 Family history of disorders of kidney and ureter: Secondary | ICD-10-CM

## 2019-06-04 DIAGNOSIS — I110 Hypertensive heart disease with heart failure: Secondary | ICD-10-CM | POA: Diagnosis not present

## 2019-06-04 DIAGNOSIS — I25119 Atherosclerotic heart disease of native coronary artery with unspecified angina pectoris: Secondary | ICD-10-CM

## 2019-06-04 DIAGNOSIS — Z8249 Family history of ischemic heart disease and other diseases of the circulatory system: Secondary | ICD-10-CM

## 2019-06-04 DIAGNOSIS — I252 Old myocardial infarction: Secondary | ICD-10-CM

## 2019-06-04 DIAGNOSIS — I248 Other forms of acute ischemic heart disease: Secondary | ICD-10-CM | POA: Diagnosis not present

## 2019-06-04 DIAGNOSIS — Z951 Presence of aortocoronary bypass graft: Secondary | ICD-10-CM

## 2019-06-04 DIAGNOSIS — J449 Chronic obstructive pulmonary disease, unspecified: Secondary | ICD-10-CM | POA: Diagnosis present

## 2019-06-04 DIAGNOSIS — Z20828 Contact with and (suspected) exposure to other viral communicable diseases: Secondary | ICD-10-CM | POA: Diagnosis present

## 2019-06-04 DIAGNOSIS — I10 Essential (primary) hypertension: Secondary | ICD-10-CM | POA: Diagnosis present

## 2019-06-04 DIAGNOSIS — N183 Chronic kidney disease, stage 3 unspecified: Secondary | ICD-10-CM

## 2019-06-04 DIAGNOSIS — I700 Atherosclerosis of aorta: Secondary | ICD-10-CM | POA: Diagnosis not present

## 2019-06-04 DIAGNOSIS — R042 Hemoptysis: Secondary | ICD-10-CM | POA: Diagnosis present

## 2019-06-04 DIAGNOSIS — I351 Nonrheumatic aortic (valve) insufficiency: Secondary | ICD-10-CM | POA: Diagnosis present

## 2019-06-04 DIAGNOSIS — I5023 Acute on chronic systolic (congestive) heart failure: Secondary | ICD-10-CM | POA: Diagnosis not present

## 2019-06-04 DIAGNOSIS — M10062 Idiopathic gout, left knee: Secondary | ICD-10-CM | POA: Diagnosis not present

## 2019-06-04 DIAGNOSIS — I255 Ischemic cardiomyopathy: Secondary | ICD-10-CM | POA: Diagnosis present

## 2019-06-04 DIAGNOSIS — E1122 Type 2 diabetes mellitus with diabetic chronic kidney disease: Secondary | ICD-10-CM | POA: Diagnosis present

## 2019-06-04 DIAGNOSIS — I2510 Atherosclerotic heart disease of native coronary artery without angina pectoris: Secondary | ICD-10-CM | POA: Diagnosis present

## 2019-06-04 DIAGNOSIS — I5033 Acute on chronic diastolic (congestive) heart failure: Secondary | ICD-10-CM | POA: Diagnosis present

## 2019-06-04 DIAGNOSIS — E876 Hypokalemia: Secondary | ICD-10-CM | POA: Diagnosis not present

## 2019-06-04 DIAGNOSIS — E1165 Type 2 diabetes mellitus with hyperglycemia: Secondary | ICD-10-CM | POA: Diagnosis present

## 2019-06-04 DIAGNOSIS — Z7982 Long term (current) use of aspirin: Secondary | ICD-10-CM

## 2019-06-04 DIAGNOSIS — R0602 Shortness of breath: Secondary | ICD-10-CM | POA: Diagnosis not present

## 2019-06-04 DIAGNOSIS — E8770 Fluid overload, unspecified: Secondary | ICD-10-CM

## 2019-06-04 DIAGNOSIS — Z87891 Personal history of nicotine dependence: Secondary | ICD-10-CM

## 2019-06-04 DIAGNOSIS — E785 Hyperlipidemia, unspecified: Secondary | ICD-10-CM | POA: Diagnosis present

## 2019-06-04 DIAGNOSIS — Z833 Family history of diabetes mellitus: Secondary | ICD-10-CM

## 2019-06-04 DIAGNOSIS — Z825 Family history of asthma and other chronic lower respiratory diseases: Secondary | ICD-10-CM

## 2019-06-04 DIAGNOSIS — Z79899 Other long term (current) drug therapy: Secondary | ICD-10-CM

## 2019-06-04 DIAGNOSIS — N179 Acute kidney failure, unspecified: Secondary | ICD-10-CM | POA: Diagnosis not present

## 2019-06-04 DIAGNOSIS — Z95810 Presence of automatic (implantable) cardiac defibrillator: Secondary | ICD-10-CM

## 2019-06-04 DIAGNOSIS — IMO0002 Reserved for concepts with insufficient information to code with codable children: Secondary | ICD-10-CM | POA: Diagnosis present

## 2019-06-04 DIAGNOSIS — Z885 Allergy status to narcotic agent status: Secondary | ICD-10-CM

## 2019-06-04 DIAGNOSIS — E1142 Type 2 diabetes mellitus with diabetic polyneuropathy: Secondary | ICD-10-CM | POA: Diagnosis present

## 2019-06-04 DIAGNOSIS — I517 Cardiomegaly: Secondary | ICD-10-CM | POA: Diagnosis not present

## 2019-06-04 DIAGNOSIS — M1A9XX0 Chronic gout, unspecified, without tophus (tophi): Secondary | ICD-10-CM

## 2019-06-04 LAB — CBC WITH DIFFERENTIAL/PLATELET
Abs Immature Granulocytes: 0.02 10*3/uL (ref 0.00–0.07)
Basophils Absolute: 0 10*3/uL (ref 0.0–0.1)
Basophils Relative: 1 %
Eosinophils Absolute: 0.2 10*3/uL (ref 0.0–0.5)
Eosinophils Relative: 4 %
HCT: 35 % — ABNORMAL LOW (ref 39.0–52.0)
Hemoglobin: 11 g/dL — ABNORMAL LOW (ref 13.0–17.0)
Immature Granulocytes: 1 %
Lymphocytes Relative: 16 %
Lymphs Abs: 0.7 10*3/uL (ref 0.7–4.0)
MCH: 29.1 pg (ref 26.0–34.0)
MCHC: 31.4 g/dL (ref 30.0–36.0)
MCV: 92.6 fL (ref 80.0–100.0)
Monocytes Absolute: 0.7 10*3/uL (ref 0.1–1.0)
Monocytes Relative: 16 %
Neutro Abs: 2.7 10*3/uL (ref 1.7–7.7)
Neutrophils Relative %: 62 %
Platelets: 181 10*3/uL (ref 150–400)
RBC: 3.78 MIL/uL — ABNORMAL LOW (ref 4.22–5.81)
RDW: 18.6 % — ABNORMAL HIGH (ref 11.5–15.5)
WBC: 4.3 10*3/uL (ref 4.0–10.5)
nRBC: 0 % (ref 0.0–0.2)

## 2019-06-04 LAB — BASIC METABOLIC PANEL
Anion gap: 10 (ref 5–15)
BUN: 49 mg/dL — ABNORMAL HIGH (ref 8–23)
CO2: 26 mmol/L (ref 22–32)
Calcium: 8.6 mg/dL — ABNORMAL LOW (ref 8.9–10.3)
Chloride: 99 mmol/L (ref 98–111)
Creatinine, Ser: 2.06 mg/dL — ABNORMAL HIGH (ref 0.61–1.24)
GFR calc Af Amer: 39 mL/min — ABNORMAL LOW (ref >60)
GFR calc non Af Amer: 33 mL/min — ABNORMAL LOW (ref >60)
Glucose, Bld: 156 mg/dL — ABNORMAL HIGH (ref 70–99)
Potassium: 3.8 mmol/L (ref 3.5–5.1)
Sodium: 135 mmol/L (ref 135–145)

## 2019-06-04 LAB — TROPONIN I: Troponin I: 0.09 ng/mL (ref <0.03)

## 2019-06-04 LAB — BRAIN NATRIURETIC PEPTIDE: B Natriuretic Peptide: 1580.2 pg/mL — ABNORMAL HIGH (ref 0.0–100.0)

## 2019-06-04 LAB — SARS CORONAVIRUS 2 AG (30 MIN TAT): SARS Coronavirus 2 Ag: NEGATIVE

## 2019-06-04 MED ORDER — FUROSEMIDE 10 MG/ML IJ SOLN
60.0000 mg | Freq: Once | INTRAMUSCULAR | Status: AC
  Administered 2019-06-04: 60 mg via INTRAVENOUS
  Filled 2019-06-04: qty 6

## 2019-06-04 NOTE — ED Provider Notes (Signed)
[facility] EMERGENCY DEPARTMENT Provider Note   CSN: [id number] Arrival date & time: 06/04/19  2215    History   Chief Complaint Chief Complaint  Patient presents with  . Leg Swelling    HPI Patrick Stewart is a 63 y.o. male with a hx of CAD s/p PCI & CABG, ischemic cardiomyopathy, CHF last EF 20-25%, biventricular ICD,  HTN, CKD, T2DM, and COPD who presents to the ED w/ complaints of leg swelling & dyspnea x 4 days. Patient reports progressively worsening swelling to the bilateral lower extremities, states he feels his abdomen is swollen as well. Swelling is constant w/o alleviating/aggravating factors. He notes that he has had associated dyspnea specifically w/ exertion & w/ attempting to sleep at night, has had to sleep in a recliner secondary to orthopnea. Has had intermittent productive cough w/ clear mucous sputum. He states he is prescribed 20 mg BID Torsemide but has independently increased this to 40 mg BID without much change in his sxs. He feels his sxs are similar to prior CHF exacerbations that have required admission. Denies fever, chills, chest pain, leg pain, hemoptysis, abdominal pain, vomiting, or diarrhea.      HPI  Past Medical History:  Diagnosis Date  . CAD (coronary artery disease) 2010   3v CABG  . CHF (congestive heart failure) ([place]) 01/26/2012  . CKD (chronic kidney disease) 02/19/2012  . COPD with chronic bronchitis ([place]) 08/26/2016  . Diabetes mellitus type 2, uncontrolled ([place]) 01/26/2012  . Elevated liver function tests 01/26/2012  . Gout 01/26/2012  . Hx of CABG 2010   RCA Stent 2008, CABG x 2 [facility] 2010  . Hypercholesterolemia 07/08/2016  . Hypertension 09/04/2014  . Ischemic cardiomyopathy 11/15/2014  . Kidney stone 10/25/2015  . Lipid disorder 01/26/2012  . Noncompliance 10/18/2018  . NSTEMI (non-ST elevated myocardial infarction) ([place]) 07/08/2016  . ST elevation myocardial infarction (STEMI) ([place]) 2010  . Unstable angina ([place])  11/14/2014    Patient Active Problem List   Diagnosis Date Noted  . Diabetic polyneuropathy associated with type 2 diabetes mellitus ([place]) 02/15/2019  . Noncompliance 10/18/2018  . Acute on chronic diastolic CHF (congestive heart failure) ([place]) 07/16/2018  . Noncompliance by refusing intervention or support 04/06/2018  . Uncontrolled type 2 diabetes mellitus with hyperglycemia ([place]) 01/31/2018  . Hypertensive urgency 07/12/2017  . LBBB (left bundle branch block) 04/26/2017  . COPD with chronic bronchitis ([place]) 08/26/2016  . Kidney stone on right side 10/25/2015  . CKD (chronic kidney disease), stage III ([place]) 10/13/2015  . Gout 10/13/2015  . CAD Status post CABG 2 SVG to OM 1, LIMA to mid LAD: 10/13/2015  . Cardiomyopathy, ischemic, chronic systolic CHF 87/86/7672  . Essential hypertension 09/04/2014  . Benign prostatic hyperplasia with lower urinary tract symptoms 01/24/2013  . Peyronie's disease 12/18/2012  . Chronic joint pain 06/22/2012  . History of tobacco abuse 01/26/2012  . Inguinal hernia, right 01/26/2012    Past Surgical History:  Procedure Laterality Date  . BIV ICD INSERTION CRT-D N/A 10/21/2017   Procedure: BIV ICD INSERTION CRT-D;  Surgeon: Constance Haw, MD;  Location: [facility];  Service: Cardiovascular;  Laterality: N/A;  . CORONARY ARTERY BYPASS GRAFT  2010  . KNEE SURGERY    . RIGHT/LEFT HEART CATH AND CORONARY ANGIOGRAPHY N/A 04/15/2017   Procedure: Right/Left Heart Cath and Coronary Angiography;  Surgeon: Larey Dresser, MD;  Location: [facility];  Service: Cardiovascular;  Laterality: N/A;  . ROTATOR CUFF REPAIR    .  WRIST SURGERY          Home Medications    Prior to Admission medications   Medication Sig Start Date End Date Taking? Authorizing Provider  albuterol (PROVENTIL HFA;VENTOLIN HFA) 108 (90 Base) MCG/ACT inhaler Inhale 2 puffs into the lungs every 6 (six) hours as needed for wheezing or shortness of breath.     [provider]  albuterol (PROVENTIL) (2.5 MG/3ML) 0.083% nebulizer solution INHALE CONTENTS OF 1 VIAL VIA NEBULIZER EVERY 6 HOURS AS NEEDED FOR WHEEZING/SHORTNESS OF BREATH 11/11/18   Wendling, Crosby Oyster, DO  allopurinol (ZYLOPRIM) 300 MG tablet Take 1 tablet (300 mg total) by mouth 2 (two) times daily. 02/15/19   Shelda Pal, DO  aspirin EC 81 MG EC tablet Take 1 tablet (81 mg total) by mouth daily. 04/16/17   Clegg, Amy D, NP  Aspirin-Salicylamide-Caffeine (BC HEADACHE POWDER PO) Take 1 packet by mouth daily as needed (headache/pain).    [provider]  atorvastatin (LIPITOR) 80 MG tablet Take 1 tablet (80 mg total) by mouth daily. 08/26/16   Shelda Pal, DO  carvedilol (COREG) 25 MG tablet Take 1 tablet (25 mg total) by mouth 2 (two) times daily with a meal. 03/15/19   Wendling, Crosby Oyster, DO  celecoxib (CELEBREX) 100 MG capsule TAKE 1 CAPSULE BY MOUTH TWICE A DAY 03/31/19   Wendling, Crosby Oyster, DO  COLCRYS 0.6 MG tablet TAKE 1 TABLET BY MOUTH TWICE A DAY Patient taking differently: TAKE 1 TABLET BY MOUTH TWICE A DAY AS NEEDED FOR GOUT ATTACK 01/28/18   Wendling, Crosby Oyster, DO  digoxin (LANOXIN) 0.125 MG tablet TAKE 1 TABLET BY MOUTH EVERY DAY 10/19/18   Clegg, Amy D, NP  DULoxetine (CYMBALTA) 20 MG capsule Take 1 capsule (20 mg total) by mouth daily. 05/16/19   Shelda Pal, DO  gabapentin (NEURONTIN) 600 MG tablet Take 1 tablet (600 mg total) by mouth 2 (two) times daily. 05/12/19   Shelda Pal, DO  glipiZIDE (GLUCOTROL) 5 MG tablet Take 1 tablet (5 mg total) by mouth daily before breakfast. 05/12/19   Wendling, Crosby Oyster, DO  glucose blood test strip Use as instructed 08/26/16   Shelda Pal, DO  Investigational - Study Medication Take 1 tablet by mouth 2 (two) times daily. Study name: Galactic HF Study Additional study details: Omecamtiv Mecarbil or Placebo Patient taking differently: Take 1 tablet by mouth  2 (two) times daily. Study name: Galactic HF Study Additional study details: Omecamtiv Mecarbil or Placebo Managed by Dr. Aundra Dubin [facility] RN) 04/15/17   Larey Dresser, MD  Lancets (FREESTYLE) lancets Use to check sugars daily 08/26/16   Shelda Pal, DO  Lidocaine 1.8 % PTCH Apply 1 patch topically daily as needed (pain). 10/23/18   Fawze, Mina A, PA-C  sacubitril-valsartan (ENTRESTO) 49-51 MG Take 1 tablet by mouth 2 (two) times daily. 02/03/18   Camnitz, Ocie Doyne, MD  spironolactone (ALDACTONE) 25 MG tablet Take 1 tablet (25 mg total) by mouth daily. 04/12/19   Shelda Pal, DO  torsemide (DEMADEX) 20 MG tablet Take 2 tablets (40 mg total) by mouth daily. 03/15/19   Shelda Pal, DO    Family History Family History  Problem Relation Age of Onset  . Hypertension Mother   . Diabetes Mother   . Hyperlipidemia Mother   . Kidney failure Mother   . Emphysema Father     Social History Social History   Tobacco Use  . Smoking status: Former Smoker  Packs/day: 0.50    Years: 30.00    Pack years: 15.00    Last attempt to quit: 07/10/2016    Years since quitting: 2.9  . Smokeless tobacco: Never Used  Substance Use Topics  . Alcohol use: No  . Drug use: No     Allergies   Hydrocodone and Tramadol   Review of Systems Review of Systems  Constitutional: Negative for chills and fever.  Respiratory: Positive for cough and shortness of breath.        - for hemoptysis  Cardiovascular: Positive for leg swelling. Negative for chest pain.  Gastrointestinal: Positive for abdominal distention. Negative for abdominal pain, diarrhea, nausea and vomiting.  Genitourinary: Negative for dysuria.  Musculoskeletal: Negative for myalgias.  All other systems reviewed and are negative.   Physical Exam Updated Vital Signs There were no vitals taken for this visit.  Physical Exam Vitals signs and nursing note reviewed.  Constitutional:      General: He is not  in acute distress.    Appearance: He is well-developed. He is not toxic-appearing.  HENT:     Head: Normocephalic and atraumatic.  Eyes:     General:        Right eye: No discharge.        Left eye: No discharge.     Conjunctiva/sclera: Conjunctivae normal.  Neck:     Musculoskeletal: Neck supple.  Cardiovascular:     Rate and Rhythm: Normal rate and regular rhythm.     Pulses:          Dorsalis pedis pulses are 2+ on the right side and 2+ on the left side.  Pulmonary:     Effort: Tachypnea (mild) present. No respiratory distress.     Breath sounds: Decreased breath sounds (bibasilar) present. No wheezing.     Comments: SpO2 92-96% on RA @ rest while I am in exam room.  Abdominal:     Palpations: Abdomen is soft.     Tenderness: There is no abdominal tenderness. There is no guarding or rebound.     Comments: Mild swelling/distension to the abdomen.   Musculoskeletal:     Right lower leg: Edema present.     Left lower leg: Edema present.     Comments: 2+ pitting edema to the feet/lower legs, trace to the knees, bilateral & symmetric. No overlying erythema/warmth.   Skin:    General: Skin is warm and dry.     Findings: No rash.  Neurological:     Mental Status: He is alert.     Comments: Clear speech.   Psychiatric:        Behavior: Behavior normal.    ED Treatments / Results  Labs (all labs ordered are listed, but only abnormal results are displayed) Labs Reviewed  SARS CORONAVIRUS 2 (HOSP ORDER, PERFORMED IN [facility] LAB VIA ABBOTT ID)  BASIC METABOLIC PANEL  BRAIN NATRIURETIC PEPTIDE  CBC WITH DIFFERENTIAL/PLATELET  TROPONIN I    EKG EKG Interpretation  Date/Time:  Sunday June 04 2019 22:23:21 EDT Ventricular Rate:  78 PR Interval:    QRS Duration: 154 QT Interval:  389 QTC Calculation: 444 R Axis:   -84 Text Interpretation:  Atrial-sensed ventricular-paced rhythm No further analysis attempted due to paced rhythm Baseline wander in lead(s) II III aVF  Confirmed by Julianne Rice [phone]) on 06/04/2019 10:27:14 PM   Radiology No results found.  Procedures Procedures (including critical care time)  Medications Ordered in ED Medications - No data to display  Chart review:  Hospital admission 11/2018 Echo 12/27/18: Moderately dilated left ventricle. Severely reduced LV systolic function with severe global LV hypokinesis. Ejection fraction is 20-25% Mild mitral annular calcification. Mild mitral regurgitation. There is mild aortic sclerosis noted, with no evidence of stenosis. Mild aortic regurgitation. Mild tricuspid regurgitation. Mild pulmonary hypertension. Estimated pulmonary artery pressure 4mmHg.  Initial Impression / Assessment and Plan / ED Course  I have reviewed the triage vital signs and the nursing notes.  Pertinent labs & imaging results that were available during my care of the patient were reviewed by me and considered in my medical decision making (see chart for details).   Patient presents to the ED w/ DOE, orthopnea, & bilateral lower extremity edema. Nontoxic appearing, mildly tachypneic & hypertensive. Exam w/ decreased breath sounds @ the bases, pitting symmetric edema to the LEs & mild abdominal swelling/distension noted. Suspect CHF exacerbation, additional DDX: ACS, pneumonia, viral respiratory illness, COPD exacerbation, PE. Plan for EKG, CXR, & labs. On chart review patient appears to have been last admitted 11/2018, echocardiogram @ that time revealed moderate dilated LV, severely reduced LV systolic function w/ global LV hypokinesis, EF 20-25% w/ additional findings as above.   EKG: Paced  Labs & CXR pending.   23:00: Patient care transitioned to Dr. Dina Rich @ change of shift pending remaining work-up, re-eval, & disposition.   Patrick Stewart was evaluated in Emergency Department on 06/04/2019 for the symptoms described in the history of present illness. He/she was evaluated in the context of the global  COVID-19 pandemic, which necessitated consideration that the patient might be at risk for infection with the SARS-CoV-2 virus that causes COVID-19. Institutional protocols and algorithms that pertain to the evaluation of patients at risk for COVID-19 are in a state of rapid change based on information released by regulatory bodies including the CDC and federal and state organizations. These policies and algorithms were followed during the patient's care in the ED.    Final Clinical Impressions(s) / ED Diagnoses   Final diagnoses:  None    ED Discharge Orders    None       Amaryllis Dyke, PA-C 06/04/19 2255    Merryl Hacker, MD 06/05/19 [phone]

## 2019-06-04 NOTE — ED Triage Notes (Signed)
Pt c/o bilateral leg swelling, ShOB, and orthopnea x 3-4 nights. Pt states he has been taking double of his "fluid pill" with no relief.

## 2019-06-04 NOTE — ED Notes (Signed)
Lab called a troponin of 0.09. Dr. Dina Rich aware.

## 2019-06-05 ENCOUNTER — Inpatient Hospital Stay (HOSPITAL_COMMUNITY): Payer: Medicare Other

## 2019-06-05 DIAGNOSIS — I351 Nonrheumatic aortic (valve) insufficiency: Secondary | ICD-10-CM | POA: Diagnosis present

## 2019-06-05 DIAGNOSIS — Z951 Presence of aortocoronary bypass graft: Secondary | ICD-10-CM | POA: Diagnosis not present

## 2019-06-05 DIAGNOSIS — I272 Pulmonary hypertension, unspecified: Secondary | ICD-10-CM | POA: Diagnosis present

## 2019-06-05 DIAGNOSIS — Z8249 Family history of ischemic heart disease and other diseases of the circulatory system: Secondary | ICD-10-CM | POA: Diagnosis not present

## 2019-06-05 DIAGNOSIS — Z95810 Presence of automatic (implantable) cardiac defibrillator: Secondary | ICD-10-CM | POA: Diagnosis not present

## 2019-06-05 DIAGNOSIS — I700 Atherosclerosis of aorta: Secondary | ICD-10-CM | POA: Diagnosis present

## 2019-06-05 DIAGNOSIS — I10 Essential (primary) hypertension: Secondary | ICD-10-CM | POA: Diagnosis not present

## 2019-06-05 DIAGNOSIS — I5043 Acute on chronic combined systolic (congestive) and diastolic (congestive) heart failure: Secondary | ICD-10-CM | POA: Diagnosis present

## 2019-06-05 DIAGNOSIS — Z955 Presence of coronary angioplasty implant and graft: Secondary | ICD-10-CM | POA: Diagnosis not present

## 2019-06-05 DIAGNOSIS — E1142 Type 2 diabetes mellitus with diabetic polyneuropathy: Secondary | ICD-10-CM | POA: Diagnosis present

## 2019-06-05 DIAGNOSIS — E876 Hypokalemia: Secondary | ICD-10-CM | POA: Diagnosis not present

## 2019-06-05 DIAGNOSIS — I5033 Acute on chronic diastolic (congestive) heart failure: Secondary | ICD-10-CM | POA: Diagnosis not present

## 2019-06-05 DIAGNOSIS — Z87891 Personal history of nicotine dependence: Secondary | ICD-10-CM | POA: Diagnosis not present

## 2019-06-05 DIAGNOSIS — R042 Hemoptysis: Secondary | ICD-10-CM | POA: Diagnosis present

## 2019-06-05 DIAGNOSIS — I361 Nonrheumatic tricuspid (valve) insufficiency: Secondary | ICD-10-CM | POA: Diagnosis not present

## 2019-06-05 DIAGNOSIS — N183 Chronic kidney disease, stage 3 (moderate): Secondary | ICD-10-CM | POA: Diagnosis present

## 2019-06-05 DIAGNOSIS — N179 Acute kidney failure, unspecified: Secondary | ICD-10-CM

## 2019-06-05 DIAGNOSIS — I255 Ischemic cardiomyopathy: Secondary | ICD-10-CM | POA: Diagnosis present

## 2019-06-05 DIAGNOSIS — E785 Hyperlipidemia, unspecified: Secondary | ICD-10-CM | POA: Diagnosis present

## 2019-06-05 DIAGNOSIS — I5023 Acute on chronic systolic (congestive) heart failure: Secondary | ICD-10-CM | POA: Diagnosis not present

## 2019-06-05 DIAGNOSIS — E1159 Type 2 diabetes mellitus with other circulatory complications: Secondary | ICD-10-CM | POA: Diagnosis not present

## 2019-06-05 DIAGNOSIS — M10062 Idiopathic gout, left knee: Secondary | ICD-10-CM | POA: Diagnosis not present

## 2019-06-05 DIAGNOSIS — E1165 Type 2 diabetes mellitus with hyperglycemia: Secondary | ICD-10-CM | POA: Diagnosis present

## 2019-06-05 DIAGNOSIS — I130 Hypertensive heart and chronic kidney disease with heart failure and stage 1 through stage 4 chronic kidney disease, or unspecified chronic kidney disease: Secondary | ICD-10-CM | POA: Diagnosis present

## 2019-06-05 DIAGNOSIS — IMO0002 Reserved for concepts with insufficient information to code with codable children: Secondary | ICD-10-CM | POA: Diagnosis present

## 2019-06-05 DIAGNOSIS — I509 Heart failure, unspecified: Secondary | ICD-10-CM | POA: Insufficient documentation

## 2019-06-05 DIAGNOSIS — J449 Chronic obstructive pulmonary disease, unspecified: Secondary | ICD-10-CM | POA: Diagnosis present

## 2019-06-05 DIAGNOSIS — E1122 Type 2 diabetes mellitus with diabetic chronic kidney disease: Secondary | ICD-10-CM | POA: Diagnosis present

## 2019-06-05 DIAGNOSIS — I252 Old myocardial infarction: Secondary | ICD-10-CM | POA: Diagnosis not present

## 2019-06-05 DIAGNOSIS — I2510 Atherosclerotic heart disease of native coronary artery without angina pectoris: Secondary | ICD-10-CM | POA: Diagnosis present

## 2019-06-05 DIAGNOSIS — E8770 Fluid overload, unspecified: Secondary | ICD-10-CM | POA: Diagnosis not present

## 2019-06-05 DIAGNOSIS — Z20828 Contact with and (suspected) exposure to other viral communicable diseases: Secondary | ICD-10-CM | POA: Diagnosis present

## 2019-06-05 DIAGNOSIS — I248 Other forms of acute ischemic heart disease: Secondary | ICD-10-CM | POA: Diagnosis present

## 2019-06-05 LAB — GLUCOSE, CAPILLARY
Glucose-Capillary: 99 mg/dL (ref 70–99)
Glucose-Capillary: 129 mg/dL — ABNORMAL HIGH (ref 70–99)
Glucose-Capillary: 67 mg/dL — ABNORMAL LOW (ref 70–99)
Glucose-Capillary: 70 mg/dL (ref 70–99)
Glucose-Capillary: 136 mg/dL — ABNORMAL HIGH (ref 70–99)
Glucose-Capillary: 125 mg/dL — ABNORMAL HIGH (ref 70–99)

## 2019-06-05 LAB — CREATININE, SERUM
Creatinine, Ser: 1.73 mg/dL — ABNORMAL HIGH (ref 0.61–1.24)
GFR calc Af Amer: 48 mL/min — ABNORMAL LOW (ref >60)
GFR calc non Af Amer: 41 mL/min — ABNORMAL LOW (ref >60)

## 2019-06-05 LAB — CBC
HCT: 37.8 % — ABNORMAL LOW (ref 39.0–52.0)
Hemoglobin: 11.8 g/dL — ABNORMAL LOW (ref 13.0–17.0)
MCH: 29.8 pg (ref 26.0–34.0)
MCHC: 31.2 g/dL (ref 30.0–36.0)
MCV: 95.5 fL (ref 80.0–100.0)
Platelets: 175 10*3/uL (ref 150–400)
RBC: 3.96 MIL/uL — ABNORMAL LOW (ref 4.22–5.81)
RDW: 19.2 % — ABNORMAL HIGH (ref 11.5–15.5)
WBC: 3.3 10*3/uL — ABNORMAL LOW (ref 4.0–10.5)
nRBC: 0 % (ref 0.0–0.2)

## 2019-06-05 LAB — HEMOGLOBIN A1C
Hgb A1c MFr Bld: 6.8 % — ABNORMAL HIGH (ref 4.8–5.6)
Mean Plasma Glucose: 148.46 mg/dL

## 2019-06-05 LAB — ECHOCARDIOGRAM COMPLETE
Height: 70 in
Weight: 3124.8 oz

## 2019-06-05 LAB — TROPONIN I
Troponin I: 0.1 ng/mL (ref <0.03)
Troponin I: 0.09 ng/mL (ref <0.03)
Troponin I: 0.08 ng/mL (ref <0.03)

## 2019-06-05 LAB — DIGOXIN LEVEL: Digoxin Level: <0.2 ng/mL — ABNORMAL LOW (ref 0.8–2.0)

## 2019-06-05 LAB — MRSA PCR SCREENING: MRSA by PCR: POSITIVE — AB

## 2019-06-05 MED ORDER — ENOXAPARIN SODIUM 30 MG/0.3ML ~~LOC~~ SOLN
30.0000 mg | SUBCUTANEOUS | Status: DC
  Administered 2019-06-05: 30 mg via SUBCUTANEOUS
  Filled 2019-06-05: qty 0.3

## 2019-06-05 MED ORDER — MUPIROCIN 2 % EX OINT
1.0000 "application " | TOPICAL_OINTMENT | Freq: Two times a day (BID) | CUTANEOUS | Status: AC
  Administered 2019-06-05 – 2019-06-10 (×10): 1 via NASAL
  Filled 2019-06-05: qty 22

## 2019-06-05 MED ORDER — GABAPENTIN 300 MG PO CAPS
600.0000 mg | ORAL_CAPSULE | Freq: Two times a day (BID) | ORAL | Status: DC
  Administered 2019-06-05 (×2): 600 mg via ORAL
  Filled 2019-06-05 (×5): qty 2

## 2019-06-05 MED ORDER — ENOXAPARIN SODIUM 40 MG/0.4ML ~~LOC~~ SOLN
40.0000 mg | SUBCUTANEOUS | Status: DC
  Administered 2019-06-06 – 2019-06-09 (×4): 40 mg via SUBCUTANEOUS
  Filled 2019-06-05 (×4): qty 0.4

## 2019-06-05 MED ORDER — SODIUM CHLORIDE 0.9% FLUSH
3.0000 mL | Freq: Two times a day (BID) | INTRAVENOUS | Status: DC
  Administered 2019-06-05 – 2019-06-09 (×10): 3 mL via INTRAVENOUS

## 2019-06-05 MED ORDER — DIGOXIN 125 MCG PO TABS
125.0000 ug | ORAL_TABLET | Freq: Every day | ORAL | Status: DC
  Administered 2019-06-05 – 2019-06-10 (×6): 125 ug via ORAL
  Filled 2019-06-05 (×6): qty 1

## 2019-06-05 MED ORDER — CHLORHEXIDINE GLUCONATE CLOTH 2 % EX PADS
6.0000 | MEDICATED_PAD | Freq: Every day | CUTANEOUS | Status: DC
  Administered 2019-06-06 – 2019-06-09 (×4): 6 via TOPICAL

## 2019-06-05 MED ORDER — ALLOPURINOL 300 MG PO TABS
300.0000 mg | ORAL_TABLET | Freq: Every day | ORAL | Status: DC
  Administered 2019-06-05 – 2019-06-10 (×6): 300 mg via ORAL
  Filled 2019-06-05 (×6): qty 1

## 2019-06-05 MED ORDER — ATORVASTATIN CALCIUM 40 MG PO TABS
80.0000 mg | ORAL_TABLET | Freq: Every day | ORAL | Status: DC
  Administered 2019-06-05 – 2019-06-10 (×6): 80 mg via ORAL
  Filled 2019-06-05 (×6): qty 2

## 2019-06-05 MED ORDER — FUROSEMIDE 10 MG/ML IJ SOLN
40.0000 mg | Freq: Three times a day (TID) | INTRAMUSCULAR | Status: DC
  Administered 2019-06-05 – 2019-06-08 (×10): 40 mg via INTRAVENOUS
  Filled 2019-06-05 (×10): qty 4

## 2019-06-05 MED ORDER — INSULIN ASPART 100 UNIT/ML ~~LOC~~ SOLN
0.0000 [IU] | Freq: Three times a day (TID) | SUBCUTANEOUS | Status: DC
  Administered 2019-06-05: 2 [IU] via SUBCUTANEOUS
  Administered 2019-06-07: 3 [IU] via SUBCUTANEOUS
  Administered 2019-06-07 – 2019-06-08 (×3): 2 [IU] via SUBCUTANEOUS
  Administered 2019-06-09: 8 [IU] via SUBCUTANEOUS
  Administered 2019-06-09: 2 [IU] via SUBCUTANEOUS
  Administered 2019-06-09: 3 [IU] via SUBCUTANEOUS
  Administered 2019-06-10: 2 [IU] via SUBCUTANEOUS

## 2019-06-05 MED ORDER — ONDANSETRON HCL 4 MG/2ML IJ SOLN: 4.0000 mg | Freq: Four times a day (QID) | INTRAMUSCULAR | Status: DC | PRN

## 2019-06-05 MED ORDER — INSULIN ASPART 100 UNIT/ML ~~LOC~~ SOLN
0.0000 [IU] | Freq: Every day | SUBCUTANEOUS | Status: DC
  Administered 2019-06-08: 3 [IU] via SUBCUTANEOUS

## 2019-06-05 MED ORDER — ACETAMINOPHEN 325 MG PO TABS
650.0000 mg | ORAL_TABLET | ORAL | Status: DC | PRN
  Administered 2019-06-07 – 2019-06-08 (×2): 650 mg via ORAL
  Filled 2019-06-05 (×2): qty 2

## 2019-06-05 MED ORDER — CARVEDILOL 6.25 MG PO TABS
6.2500 mg | ORAL_TABLET | Freq: Two times a day (BID) | ORAL | Status: DC
  Administered 2019-06-05 – 2019-06-09 (×8): 6.25 mg via ORAL
  Filled 2019-06-05 (×8): qty 1

## 2019-06-05 MED ORDER — SACUBITRIL-VALSARTAN 24-26 MG PO TABS
1.0000 | ORAL_TABLET | Freq: Two times a day (BID) | ORAL | Status: DC
  Administered 2019-06-05 – 2019-06-06 (×3): 1 via ORAL
  Filled 2019-06-05 (×3): qty 1

## 2019-06-05 MED ORDER — SODIUM CHLORIDE 0.9 % IV SOLN: 250.0000 mL | INTRAVENOUS | Status: DC | PRN

## 2019-06-05 MED ORDER — ASPIRIN EC 81 MG PO TBEC
81.0000 mg | DELAYED_RELEASE_TABLET | Freq: Every day | ORAL | Status: DC
  Administered 2019-06-05: 81 mg via ORAL
  Filled 2019-06-05 (×3): qty 1

## 2019-06-05 MED ORDER — SODIUM CHLORIDE 0.9% FLUSH: 3.0000 mL | INTRAVENOUS | Status: DC | PRN

## 2019-06-05 MED ORDER — HYDRALAZINE HCL 20 MG/ML IJ SOLN: 10.0000 mg | Freq: Four times a day (QID) | INTRAMUSCULAR | Status: DC | PRN

## 2019-06-05 NOTE — H&P (Addendum)
History and Physical        Hospital Admission Note Date: 06/05/2019  Patient name: Nolin Grell Medical record number: [id number] Date of birth: 1956/09/21 Age: 63 y.o. Gender: male  PCP: Shelda Pal, DO    Patient coming from: Home  I have reviewed all records in the [facility].    Chief Complaint:  Lower leg swelling, shortness of breath worse in the last 3 days  HPI: Patient is a 63 year old male with history of CAD, 3vCABG 5465, combined systolic and diastolic CHF, EF 30 to 03% (per echo 06/2018), CKD, COPD, diabetes mellitus type 2, hypertension, hyperlipidemia, ICD presented to ED with swelling of his lower legs and shortness of breath in the last 3 to 4 days.  Patient reports that he noticed a swelling in his legs in the last 1 week and worse in the last 3 days, now up to the thighs and abdomen also feels bloated.  Denied any chest pain however has shortness of breath, worsening with exertion, orthopnea and PND.  Feels winded on walking.  He has been sleeping in a recliner secondary to orthopnea.  No fevers or chills, has intermittent productive cough.  Patient is on torsemide 20 mg twice a day but in the last 3 days had been taking 40 mg twice a day but with no improvement in his symptoms. COVID-19 test negative  ED work-up/course:  Temp 98.3, respirate 24, pulse 78, BP 168/103, O2 sats 96% on room air  Review of Systems: Positives marked in 'bold' Constitutional: Denies fever, chills, diaphoresis, poor appetite and fatigue.  HEENT: Denies photophobia, eye pain, redness, hearing loss, ear pain, congestion, sore throat, rhinorrhea, sneezing, mouth sores, trouble swallowing, neck pain, neck stiffness and tinnitus.   Respiratory: Please see HPI Cardiovascular: Please see HPI Gastrointestinal: Denies nausea, vomiting, abdominal pain, diarrhea,  constipation, blood in stool and abdominal distention.  Genitourinary: Denies dysuria, urgency, frequency, hematuria, flank pain and difficulty urinating.  Musculoskeletal: Denies myalgias, back pain, joint swelling, arthralgias and gait problem.  Skin: Denies pallor, rash and wound.  Neurological: Denies dizziness, seizures, syncope, weakness, light-headedness, numbness and headaches.  Hematological: Denies adenopathy. Easy bruising, personal or family bleeding history  Psychiatric/Behavioral: Denies suicidal ideation, mood changes, confusion, nervousness, sleep disturbance and agitation  Past Medical History: Past Medical History:  Diagnosis Date  . CAD (coronary artery disease) 2010   3v CABG  . CHF (congestive heart failure) ([place]) 01/26/2012  . CKD (chronic kidney disease) 02/19/2012  . COPD with chronic bronchitis ([place]) 08/26/2016  . Diabetes mellitus type 2, uncontrolled ([place]) 01/26/2012  . Elevated liver function tests 01/26/2012  . Gout 01/26/2012  . Hx of CABG 2010   RCA Stent 2008, CABG x 2 [facility] 2010  . Hypercholesterolemia 07/08/2016  . Hypertension 09/04/2014  . Ischemic cardiomyopathy 11/15/2014  . Kidney stone 10/25/2015  . Lipid disorder 01/26/2012  . Noncompliance 10/18/2018  . NSTEMI (non-ST elevated myocardial infarction) ([place]) 07/08/2016  . ST elevation myocardial infarction (STEMI) ([place]) 2010  . Unstable angina ([place]) 11/14/2014    Past Surgical History:  Procedure Laterality Date  . BIV ICD INSERTION CRT-D N/A 10/21/2017   Procedure: BIV ICD INSERTION CRT-D;  Surgeon: Constance Haw, MD;  Location: [facility];  Service: Cardiovascular;  Laterality: N/A;  . CORONARY ARTERY BYPASS GRAFT  2010  . KNEE SURGERY    . RIGHT/LEFT HEART CATH AND CORONARY ANGIOGRAPHY N/A 04/15/2017   Procedure: Right/Left Heart Cath and Coronary Angiography;  Surgeon: Larey Dresser, MD;  Location: [facility];  Service: Cardiovascular;  Laterality: N/A;  .  ROTATOR CUFF REPAIR    . WRIST SURGERY      Medications: Prior to Admission medications   Medication Sig Start Date End Date Taking? Authorizing Provider  albuterol (PROVENTIL HFA;VENTOLIN HFA) 108 (90 Base) MCG/ACT inhaler Inhale 2 puffs into the lungs every 6 (six) hours as needed for wheezing or shortness of breath.    [provider]  albuterol (PROVENTIL) (2.5 MG/3ML) 0.083% nebulizer solution INHALE CONTENTS OF 1 VIAL VIA NEBULIZER EVERY 6 HOURS AS NEEDED FOR WHEEZING/SHORTNESS OF BREATH 11/11/18   Wendling, Crosby Oyster, DO  allopurinol (ZYLOPRIM) 300 MG tablet Take 1 tablet (300 mg total) by mouth 2 (two) times daily. 02/15/19   Shelda Pal, DO  aspirin EC 81 MG EC tablet Take 1 tablet (81 mg total) by mouth daily. 04/16/17   Clegg, Amy D, NP  Aspirin-Salicylamide-Caffeine (BC HEADACHE POWDER PO) Take 1 packet by mouth daily as needed (headache/pain).    [provider]  atorvastatin (LIPITOR) 80 MG tablet Take 1 tablet (80 mg total) by mouth daily. 08/26/16   Shelda Pal, DO  carvedilol (COREG) 25 MG tablet Take 1 tablet (25 mg total) by mouth 2 (two) times daily with a meal. 03/15/19   Wendling, Crosby Oyster, DO  celecoxib (CELEBREX) 100 MG capsule TAKE 1 CAPSULE BY MOUTH TWICE A DAY 03/31/19   Wendling, Crosby Oyster, DO  COLCRYS 0.6 MG tablet TAKE 1 TABLET BY MOUTH TWICE A DAY Patient taking differently: TAKE 1 TABLET BY MOUTH TWICE A DAY AS NEEDED FOR GOUT ATTACK 01/28/18   Wendling, Crosby Oyster, DO  digoxin (LANOXIN) 0.125 MG tablet TAKE 1 TABLET BY MOUTH EVERY DAY 10/19/18   Clegg, Amy D, NP  DULoxetine (CYMBALTA) 20 MG capsule Take 1 capsule (20 mg total) by mouth daily. 05/16/19   Shelda Pal, DO  gabapentin (NEURONTIN) 600 MG tablet Take 1 tablet (600 mg total) by mouth 2 (two) times daily. 05/12/19   Shelda Pal, DO  glipiZIDE (GLUCOTROL) 5 MG tablet Take 1 tablet (5 mg total) by mouth daily before breakfast. 05/12/19    Wendling, Crosby Oyster, DO  glucose blood test strip Use as instructed 08/26/16   Shelda Pal, DO  Investigational - Study Medication Take 1 tablet by mouth 2 (two) times daily. Study name: Galactic HF Study Additional study details: Omecamtiv Mecarbil or Placebo Patient taking differently: Take 1 tablet by mouth 2 (two) times daily. Study name: Galactic HF Study Additional study details: Omecamtiv Mecarbil or Placebo Managed by Dr. Aundra Dubin [facility] RN) 04/15/17   Larey Dresser, MD  Lancets (FREESTYLE) lancets Use to check sugars daily 08/26/16   Shelda Pal, DO  Lidocaine 1.8 % PTCH Apply 1 patch topically daily as needed (pain). 10/23/18   Fawze, Mina A, PA-C  sacubitril-valsartan (ENTRESTO) 49-51 MG Take 1 tablet by mouth 2 (two) times daily. 02/03/18   Camnitz, Ocie Doyne, MD  spironolactone (ALDACTONE) 25 MG tablet Take 1 tablet (25 mg total) by mouth daily. 04/12/19   Shelda Pal, DO  torsemide (DEMADEX) 20 MG tablet Take 2 tablets (  40 mg total) by mouth daily. 03/15/19   Shelda Pal, DO    Allergies:   Allergies  Allergen Reactions  . Hydrocodone Itching  . Tramadol Nausea Only    Social History:  reports that he quit smoking about 2 years ago. He has a 15.00 pack-year smoking history. He has never used smokeless tobacco. He reports that he does not drink alcohol or use drugs.  Family History: Family History  Problem Relation Age of Onset  . Hypertension Mother   . Diabetes Mother   . Hyperlipidemia Mother   . Kidney failure Mother   . Emphysema Father     Physical Exam: Blood pressure (!) 166/82, pulse 63, temperature 97.9 F (36.6 C), temperature source Oral, resp. rate 18, height 5\' 10"  (1.778 m), weight 86.2 kg, SpO2 100 %. General: Alert, awake, oriented x3, in no acute distress. Eyes: pink conjunctiva,anicteric sclera, pupils equal and reactive to light and accomodation, HEENT: normocephalic, atraumatic, oropharynx clear  Neck: supple, no masses or lymphadenopathy, no goiter, no bruits, + JVD CVS: Regular rate and rhythm, without murmurs, rubs or gallops.  3+ pitting LE edema, ICD left chest wall Resp: Bibasilar crackles GI : Soft, nontender, distended, positive bowel sounds, no masses.  Musculoskeletal: No clubbing or cyanosis, positive pedal pulses. No contracture. ROM intact  Neuro: Grossly intact, no focal neurological deficits, strength 5/5 upper and lower extremities bilaterally Psych: alert and oriented x 3, normal mood and affect Skin: no rashes or lesions, warm and dry   LABS on Admission: I have personally reviewed all the labs and imagings below    Basic Metabolic Panel: Recent Labs  Lab 06/04/19 2228  NA 135  K 3.8  CL 99  CO2 26  GLUCOSE 156*  BUN 49*  CREATININE 2.06*  CALCIUM 8.6*   Liver Function Tests: No results for input(s): AST, ALT, ALKPHOS, BILITOT, PROT, ALBUMIN in the last 168 hours. No results for input(s): LIPASE, AMYLASE in the last 168 hours. No results for input(s): AMMONIA in the last 168 hours. CBC: Recent Labs  Lab 06/04/19 2228  WBC 4.3  NEUTROABS 2.7  HGB 11.0*  HCT 35.0*  MCV 92.6  PLT 181   Cardiac Enzymes: Recent Labs  Lab 06/04/19 2229  TROPONINI 0.09*   BNP: Invalid input(s): POCBNP CBG: No results for input(s): GLUCAP in the last 168 hours.  Radiological Exams on Admission:  Dg Chest Port 1 View  Result Date: 06/04/2019 CLINICAL DATA:  63 year old male with history of bilateral lower extremity swelling. EXAM: PORTABLE CHEST 1 VIEW COMPARISON:  Chest x-ray 01/22/2019. FINDINGS: Lung volumes are normal. No consolidative airspace disease. No pleural effusions. Mild cephalization of the pulmonary vasculature, without frank pulmonary edema. Moderate cardiomegaly. Upper mediastinal contours are within normal limits. Aortic atherosclerosis. Status post median sternotomy for CABG. Left-sided biventricular pacemaker/AICD with lead tips projecting  over the expected location of the right atrium, right ventricle and left ventricle via the coronary sinus and coronary veins. IMPRESSION: 1. Cardiomegaly with pulmonary venous congestion, but no frank pulmonary edema. 2. Aortic atherosclerosis. Electronically Signed   By: Vinnie Langton M.D.   On: 06/04/2019 23:13      EKG: Independently reviewed. *Paced rhythm   Assessment/Plan Principal Problem:   Acute on chronic systolic CHF (congestive heart failure) (HCC) presenting with 3+ pitting edema, volume overload, shortness of breath, orthopnea PND -COVID-19 test negative -BNP 1580, troponin 0.09 currently no chest pain -2D echo 06/2018 had shown EF of 30 to 35%, diffuse hypokinesis, moderate  to severe pulmonary hypertension with PA pressure of 64 -Placed on IV Lasix 40 mg every 8 hours, has received 60 mg IV x1 in ED -Holding Entresto due to renal insufficiency, hold spironolactone -Obtain digoxin level, serial cardiac enzymes, 2D echo -Consulted cardiology  Active Problems: AKI on CKD (chronic kidney disease), stage III (HCC) -Creatinine likely worsening due to decreased renal perfusion, acute on chronic systolic CHF, also had received Lasix 60 mg IV x1 in ED -Hold spironolactone, Entresto, continue aggressive IV diuresis, monitor creatinine function closely    CAD Status post CABG 2 SVG to OM 1, LIMA to mid LAD, ICD: -Currently no chest pain, continue aspirin, statin    Essential hypertension -BP elevated, continue IV Lasix, IV hydralazine as needed with parameters    Uncontrolled type 2 diabetes mellitus (HCC) -Obtain hemoglobin A1c, hold oral hypoglycemics -Placed on sliding scale insulin, moderate  History of gout -Currently no gout attack, follow closely -Continue allopurinol   DVT prophylaxis: Lovenox  CODE STATUS: Full CODE STATUS  Consults called: Cardiology  Family Communication: Admission, patients condition and plan of care including tests being ordered have  been discussed with the patient  who indicates understanding and agree with the plan and Code Status  Admission status:   Disposition plan: Further plan will depend as patient's clinical course evolves and further radiologic and laboratory data become available.    At the time of admission, it appears that the appropriate admission status for this patient is INPATIENT . This is judged to be reasonable and necessary in order to provide the required intensity of service to ensure the patient's safety given the presenting symptoms dyspnea on exertion, 3+ pitting edema, orthopnea, PND, acute on chronic systolic CHF, physical exam findings, and initial radiographic and laboratory data in the context of their chronic comorbidities.  The medical decision making on this patient was of high complexity and the patient is at high risk for clinical deterioration, therefore this is a level 3 visit.    Time Spent on Admission: 60 minutes    Charle Mclaurin M.D. [facility] 06/05/2019, 7:49 AM

## 2019-06-05 NOTE — ED Notes (Signed)
[facility] at bedside 

## 2019-06-05 NOTE — Progress Notes (Signed)
CRITICAL VALUE ALERT  Critical Value:  Positive MRSA PCR  Date & Time Notied:  06/05/2019 1752  Provider Notified:   Orders Received/Actions taken: Order set placed per protocol

## 2019-06-05 NOTE — ED Notes (Signed)
[facility] notified (Patrick Stewart) - patient ready for transport 

## 2019-06-05 NOTE — ED Notes (Signed)
Ambulated patient per physicians' order. Patient able to ambulate while conversing with oxygen saturations of 91-94% heart rate of 74-84. Patient walked at a steady gait although patient was SOB after returning to room and placed back on monitor. Physician notified of results.

## 2019-06-05 NOTE — Progress Notes (Signed)
CRITICAL VALUE ALERT  Critical Value:  Troponin 0.10  Date & Time Notied:  06/05/2019 0919  Provider Notified: Dr Tana Coast  Orders Received/Actions taken: Received call back and Dr. Tana Coast is aware

## 2019-06-05 NOTE — Consult Note (Signed)
Cardiology Consultation:   Patient ID: Daren Yeagle MRN: [id number]; DOB: 05-21-1956  Admit date: 06/04/2019 Date of Consult: 06/05/2019  Primary Care Provider: Shelda Pal, DO Primary Cardiologist: Loralie Champagne, MD  Primary Electrophysiologist:  Will Meredith Leeds, MD    Patient Profile:   Syair Fricker is a 63 y.o. male with a hx of ICM, CAD s/p CABG X 2 2010, COPD, DMII, gout, former smoker, HTN  who is being seen today for the evaluation of acute on chronic systolic HF at the request of Dr. Tana Coast.  History of Present Illness:   Mr. Wlodarczyk with above hx and CABG in 2010 with (SVG -> OM1, LIMA -> LAD) in 3846 acute systolic HF and EF 65% down from 30-35%, diuresed and transitioned to torsemide.  Stable cors on cath at that time.  Entresto started.  09/2017 CRT-D MTD placed.  Has not followed up with HF team but does see Dr. Curt Bears.  ;ast visit 1 year ago and he was NYHA class 1.  Recently on remote checks his volume status was elevated and we were to increase torsemide but never got answer.   Last echo 07/16/18 with EF 30-35% LV cavity severely dilated, moderate LVH, Akinesis of the mid inferoseptal and entire inferior myocardium mod AR, mild MR, LA was mildly to moderately dilated, mod. PR, and PA pk pressure 64 mmHg  Pt presented to ER yesterday with bilateral leg swelling, SOB, fo r304 days and had doubled his fluid pill with no relief.  + swelling to thighs and abd.  Unable to lie flat to sleep.  Been sleeping in recliner.  No fever, chills does have intermittent productive cough.  Came on pretty quickly and he had increased his torsemide to 40 BID.  But no relief. No chest pain. Mild cough. No fever.     EKG:  The EKG was personally reviewed and demonstrates:  SR with BiV pacing and no changes from 10/23/18 with ST  Telemetry:  Telemetry was personally reviewed and demonstrates:  SR with V pacing  Results  Troponin 0.09 to 0.10 BNP 1580 Na 135, K+ 3.8, Cr 2.06 and today  1.73  WBC 4.3, Hgb 11, Plts 181. Dig <o.2  COVID neg   CXR 1 V IMPRESSION: 1. Cardiomegaly with pulmonary venous congestion, but no frank pulmonary edema. 2. Aortic atherosclerosis.  Echo pending Pt has rec'd lasix 60 in ER and then 40 this AM IV and ordered 40 mg every 8 hours  He is neg 1462 since admit and wt 88.6 kg. He is feeling better.  Can breathe easier and his abd fluid has improved.    Past Medical History:  Diagnosis Date  . CAD (coronary artery disease) 2010   3v CABG  . CHF (congestive heart failure) ([place]) 01/26/2012  . CKD (chronic kidney disease) 02/19/2012  . COPD with chronic bronchitis ([place]) 08/26/2016  . Diabetes mellitus type 2, uncontrolled ([place]) 01/26/2012  . Elevated liver function tests 01/26/2012  . Gout 01/26/2012  . Hx of CABG 2010   RCA Stent 2008, CABG x 2 [facility] 2010  . Hypercholesterolemia 07/08/2016  . Hypertension 09/04/2014  . Ischemic cardiomyopathy 11/15/2014  . Kidney stone 10/25/2015  . Lipid disorder 01/26/2012  . Noncompliance 10/18/2018  . NSTEMI (non-ST elevated myocardial infarction) ([place]) 07/08/2016  . ST elevation myocardial infarction (STEMI) ([place]) 2010  . Unstable angina ([place]) 11/14/2014    Past Surgical History:  Procedure Laterality Date  . BIV ICD INSERTION CRT-D N/A 10/21/2017   Procedure: BIV  ICD INSERTION CRT-D;  Surgeon: Constance Haw, MD;  Location: [facility];  Service: Cardiovascular;  Laterality: N/A;  . CORONARY ARTERY BYPASS GRAFT  2010  . KNEE SURGERY    . RIGHT/LEFT HEART CATH AND CORONARY ANGIOGRAPHY N/A 04/15/2017   Procedure: Right/Left Heart Cath and Coronary Angiography;  Surgeon: Larey Dresser, MD;  Location: [facility];  Service: Cardiovascular;  Laterality: N/A;  . ROTATOR CUFF REPAIR    . WRIST SURGERY       Home Medications:  Prior to Admission medications   Medication Sig Start Date End Date Taking? Authorizing Provider  albuterol (PROVENTIL HFA;VENTOLIN HFA) 108 (90  Base) MCG/ACT inhaler Inhale 2 puffs into the lungs every 6 (six) hours as needed for wheezing or shortness of breath.   Yes [provider]  albuterol (PROVENTIL) (2.5 MG/3ML) 0.083% nebulizer solution INHALE CONTENTS OF 1 VIAL VIA NEBULIZER EVERY 6 HOURS AS NEEDED FOR WHEEZING/SHORTNESS OF BREATH Patient taking differently: Take 2.5 mg by nebulization every 6 (six) hours as needed for wheezing or shortness of breath.  11/11/18  Yes Shelda Pal, DO  allopurinol (ZYLOPRIM) 300 MG tablet Take 1 tablet (300 mg total) by mouth 2 (two) times daily. 02/15/19  Yes Shelda Pal, DO  aspirin EC 81 MG EC tablet Take 1 tablet (81 mg total) by mouth daily. 04/16/17  Yes Clegg, Amy D, NP  atorvastatin (LIPITOR) 80 MG tablet Take 1 tablet (80 mg total) by mouth daily. 08/26/16  Yes Shelda Pal, DO  carvedilol (COREG) 25 MG tablet Take 1 tablet (25 mg total) by mouth 2 (two) times daily with a meal. 03/15/19  Yes Wendling, Crosby Oyster, DO  COLCRYS 0.6 MG tablet TAKE 1 TABLET BY MOUTH TWICE A DAY Patient taking differently: Take 0.6 mg by mouth daily as needed (gout).  01/28/18  Yes Shelda Pal, DO  gabapentin (NEURONTIN) 600 MG tablet Take 1 tablet (600 mg total) by mouth 2 (two) times daily. 05/12/19  Yes Shelda Pal, DO  glipiZIDE (GLUCOTROL) 5 MG tablet Take 1 tablet (5 mg total) by mouth daily before breakfast. 05/12/19  Yes Wendling, Crosby Oyster, DO  glucose blood test strip Use as instructed 08/26/16  Yes Wendling, Crosby Oyster, DO  Lancets (FREESTYLE) lancets Use to check sugars daily 08/26/16  Yes Shelda Pal, DO  spironolactone (ALDACTONE) 25 MG tablet Take 1 tablet (25 mg total) by mouth daily. 04/12/19  Yes Shelda Pal, DO  torsemide (DEMADEX) 20 MG tablet Take 2 tablets (40 mg total) by mouth daily. 03/15/19  Yes Shelda Pal, DO  celecoxib (CELEBREX) 100 MG capsule TAKE 1 CAPSULE BY MOUTH TWICE A DAY 03/31/19    Wendling, Crosby Oyster, DO  digoxin (LANOXIN) 0.125 MG tablet TAKE 1 TABLET BY MOUTH EVERY DAY Patient not taking: Reported on 06/05/2019 10/19/18   Darrick Grinder D, NP  DULoxetine (CYMBALTA) 20 MG capsule Take 1 capsule (20 mg total) by mouth daily. 05/16/19   Shelda Pal, DO  Investigational - Study Medication Take 1 tablet by mouth 2 (two) times daily. Study name: [facility] HF Study Additional study details: Omecamtiv Mecarbil or Placebo Patient taking differently: Take 1 tablet by mouth 2 (two) times daily. Study name: [facility] HF Study Additional study details: Omecamtiv Mecarbil or Placebo Managed by Dr. Aundra Dubin [facility] RN) 04/15/17   Larey Dresser, MD  Lidocaine 1.8 % PTCH Apply 1 patch topically daily as needed (pain). 10/23/18   Rodell Perna  A, PA-C  sacubitril-valsartan (ENTRESTO) 49-51 MG Take 1 tablet by mouth 2 (two) times daily. Patient not taking: Reported on 06/05/2019 02/03/18   Constance Haw, MD    Inpatient Medications: Scheduled Meds: . allopurinol  300 mg Oral Daily  . aspirin EC  81 mg Oral Daily  . atorvastatin  80 mg Oral Daily  . digoxin  125 mcg Oral Daily  . enoxaparin (LOVENOX) injection  30 mg Subcutaneous Q24H  . furosemide  40 mg Intravenous Q8H  . gabapentin  600 mg Oral BID  . insulin aspart  0-15 Units Subcutaneous TID WC  . insulin aspart  0-5 Units Subcutaneous QHS  . sodium chloride flush  3 mL Intravenous Q12H   Continuous Infusions: . sodium chloride     PRN Meds: sodium chloride, acetaminophen, hydrALAZINE, ondansetron (ZOFRAN) IV, sodium chloride flush  Allergies:    Allergies  Allergen Reactions  . Hydrocodone Itching  . Tramadol Nausea Only    Social History:   Social History   Socioeconomic History  . Marital status: Married    Spouse name: Not on file  . Number of children: Not on file  . Years of education: Not on file  . Highest education level: Not on file  Occupational History  . Occupation: disabled  Social  Needs  . Financial resource strain: Not on file  . Food insecurity:    Worry: Not on file    Inability: Not on file  . Transportation needs:    Medical: Not on file    Non-medical: Not on file  Tobacco Use  . Smoking status: Former Smoker    Packs/day: 0.50    Years: 30.00    Pack years: 15.00    Last attempt to quit: 07/10/2016    Years since quitting: 2.9  . Smokeless tobacco: Never Used  Substance and Sexual Activity  . Alcohol use: No  . Drug use: No  . Sexual activity: Not on file  Lifestyle  . Physical activity:    Days per week: Not on file    Minutes per session: Not on file  . Stress: Not on file  Relationships  . Social connections:    Talks on phone: Not on file    Gets together: Not on file    Attends religious service: Not on file    Active member of club or organization: Not on file    Attends meetings of clubs or organizations: Not on file    Relationship status: Not on file  . Intimate partner violence:    Fear of current or ex partner: Not on file    Emotionally abused: Not on file    Physically abused: Not on file    Forced sexual activity: Not on file  Other Topics Concern  . Not on file  Social History Narrative  . Not on file    Family History:    Family History  Problem Relation Age of Onset  . Hypertension Mother   . Diabetes Mother   . Hyperlipidemia Mother   . Kidney failure Mother   . Emphysema Father      ROS:  Please see the history of present illness.  General:no colds or fevers, + weight changes Skin:no rashes or ulcers HEENT:no blurred vision, no congestion CV:see HPI PUL:see HPI GI:no diarrhea constipation or melena, no indigestion GU:no hematuria, no dysuria MS:no joint pain, no claudication Neuro:no syncope, no lightheadedness Endo:+ diabetes, no thyroid disease  All other ROS reviewed and negative.  Physical Exam/Data:   Vitals:   06/05/19 0510 06/05/19 0623 06/05/19 0911 06/05/19 0935  BP: (!) 161/98 (!)  166/82  (!) 157/92  Pulse: 64 63  65  Resp: 18 18    Temp: 97.8 F (36.6 C) 97.9 F (36.6 C)    TempSrc: Oral Oral    SpO2: 95% 100%  96%  Weight:   88.6 kg   Height:   5\' 10"  (1.778 m)     Intake/Output Summary (Last 24 hours) at 06/05/2019 1123 Last data filed at 06/05/2019 0936 Gross per 24 hour  Intake 3 ml  Output 1825 ml  Net -1822 ml   Last 3 Weights 06/05/2019 06/04/2019 03/15/2019  Weight (lbs) 195 lb 4.8 oz 190 lb 184 lb  Weight (kg) 88.587 kg 86.183 kg 83.462 kg     Body mass index is 28.02 kg/m.  General:  Well nourished, well developed, in no acute distress HEENT: normal Lymph: no adenopathy Neck: + JVD almost to jaw line Endocrine:  No thryomegaly Vascular: No carotid bruits; pedal pulses 1+ bilaterally  Cardiac:  normal S1, S2; RRR; abnormal murmur and split S3.  Lungs:  Diminished bibasilar to auscultation bilaterally, no wheezing, rhonchi few rales  Abd: soft, nontender, no hepatomegaly  Ext: 2+ edema to knees Musculoskeletal:  No deformities, BUE and BLE strength normal and equal Skin: warm and dry  Neuro:  Alert and oriented X 3 MAE follows commands, no focal abnormalities noted Psych:  Normal affect     Relevant CV Studies: [facility] 04/15/2017  Left Main  30% stenosis.  Left Anterior Descending  90% mid LAD stenosis. Patent LIMA-LAD.  Left Circumflex  Moderate high OM1 with luminal irregularities. Small caliber distal LCx with moderate diffuse disease. 70-80% stenosis proximally in moderate OM2. Patent SVG-OM2.  Right Coronary Artery  30% proximal and mid RCA stenoses.   RA 9 RV 53/11 PA 52/17, mean 35 PCWP mean 14 LV 133/30 AO 136/86 Oxygen saturations: PA 60% AO 97% Cardiac Output (Fick) 4.12  Cardiac Index (Fick) 1.94  Echo 06/2018  Study Conclusions  - Procedure narrative: Transthoracic echocardiography. Image   quality was adequate. The study was technically difficult. - Left ventricle: The cavity size was severely dilated. Wall    thickness was increased in a pattern of moderate LVH. Systolic   function was moderately to severely reduced. The estimated   ejection fraction was in the range of 30% to 35%. Diffuse   hypokinesis. Regional wall motion abnormalities cannot be   excluded. - Regional wall motion abnormality: Akinesis of the mid   inferoseptal and entire inferior myocardium. - Aortic valve: There was moderate regurgitation. - Mitral valve: Calcified annulus. Mildly thickened leaflets .   Mobility was mildly restricted. There was mild regurgitation. - Left atrium: The atrium was mildly to moderately dilated. - Right ventricle: The cavity size was severely dilated. Wall   thickness was normal. Systolic function was reduced. - Right atrium: The atrium was moderately dilated. - Tricuspid valve: There was mild regurgitation. - Pulmonic valve: There was moderate regurgitation. - Pulmonary arteries: Systolic pressure was moderately to severely   increased. PA peak pressure: 64 mm Hg (S).  Impressions:  - Severe LV dysfunction with diffuse hypokinesis and akinesis of   the inferior and inferoseptal walls. Biatrial enlargement, RV   enlargement with reduced RV function. Elevated right sided   filling pressures. Moderate AR, mild MR and TR.   Laboratory Data:  Chemistry Recent Labs  Lab 06/04/19 2228 06/05/19 4081  NA 135  --   K 3.8  --   CL 99  --   CO2 26  --   GLUCOSE 156*  --   BUN 49*  --   CREATININE 2.06* 1.73*  CALCIUM 8.6*  --   GFRNONAA 33* 41*  GFRAA 39* 48*  ANIONGAP 10  --     No results for input(s): PROT, ALBUMIN, AST, ALT, ALKPHOS, BILITOT in the last 168 hours. Hematology Recent Labs  Lab 06/04/19 2228 06/05/19 0821  WBC 4.3 3.3*  RBC 3.78* 3.96*  HGB 11.0* 11.8*  HCT 35.0* 37.8*  MCV 92.6 95.5  MCH 29.1 29.8  MCHC 31.4 31.2  RDW 18.6* 19.2*  PLT 181 175   Cardiac Enzymes Recent Labs  Lab 06/04/19 2229 06/05/19 0821  TROPONINI 0.09* 0.10*   No results for  input(s): TROPIPOC in the last 168 hours.  BNP Recent Labs  Lab 06/04/19 2228  BNP 1,580.2*    DDimer No results for input(s): DDIMER in the last 168 hours.  Radiology/Studies:  Dg Chest Port 1 View  Result Date: 06/04/2019 CLINICAL DATA:  63 year old male with history of bilateral lower extremity swelling. EXAM: PORTABLE CHEST 1 VIEW COMPARISON:  Chest x-ray 01/22/2019. FINDINGS: Lung volumes are normal. No consolidative airspace disease. No pleural effusions. Mild cephalization of the pulmonary vasculature, without frank pulmonary edema. Moderate cardiomegaly. Upper mediastinal contours are within normal limits. Aortic atherosclerosis. Status post median sternotomy for CABG. Left-sided biventricular pacemaker/AICD with lead tips projecting over the expected location of the right atrium, right ventricle and left ventricle via the coronary sinus and coronary veins. IMPRESSION: 1. Cardiomegaly with pulmonary venous congestion, but no frank pulmonary edema. 2. Aortic atherosclerosis. Electronically Signed   By: Vinnie Langton M.D.   On: 06/04/2019 23:13    Assessment and Plan:   1. Acute systolic HF improving with IV lasix neg 1462. Continue lasix  At home was on  Torsemide 40 mg daily but increased prior to admit. On aldactone 25 mg, coreg 25 BID, entresto 49-51 BID and dig 0.125 daily. Echo pending,  2. Elevated troponin, flat most likely demand ischemia from HF 3. CAD with CABG 2010 and last cath 2018 with patent grafts. Continue ASA81 mg 4.  ICM with EF improved to 30-35% on last echo, now echo pending he had been doing well  5. Hx of CRT-D placed 2018 MDT. Followed by Dr. Curt Bears. No shocks that pt is aware of.  6. AKI with baseline Cr  Of 1.6 to 1.8, improved today from 2.06 on admit 7. DM-2 per IM 8. HLD on lipitor 80 mg continue       For questions or updates, please contact [place] Please consult [email or web address] for contact info under     Signed, Cecilie Kicks, NP   06/05/2019 11:23 AM

## 2019-06-05 NOTE — Progress Notes (Signed)
2D Echocardiogram has been performed.  Patrick Stewart 06/05/2019, 12:09 PM

## 2019-06-06 ENCOUNTER — Telehealth: Payer: Self-pay | Admitting: *Deleted

## 2019-06-06 DIAGNOSIS — E8770 Fluid overload, unspecified: Secondary | ICD-10-CM

## 2019-06-06 LAB — GLUCOSE, CAPILLARY
Glucose-Capillary: 109 mg/dL — ABNORMAL HIGH (ref 70–99)
Glucose-Capillary: 113 mg/dL — ABNORMAL HIGH (ref 70–99)
Glucose-Capillary: 110 mg/dL — ABNORMAL HIGH (ref 70–99)
Glucose-Capillary: 160 mg/dL — ABNORMAL HIGH (ref 70–99)

## 2019-06-06 LAB — CBC
HCT: 41.4 % (ref 39.0–52.0)
Hemoglobin: 12.8 g/dL — ABNORMAL LOW (ref 13.0–17.0)
MCH: 29 pg (ref 26.0–34.0)
MCHC: 30.9 g/dL (ref 30.0–36.0)
MCV: 93.9 fL (ref 80.0–100.0)
Platelets: 211 10*3/uL (ref 150–400)
RBC: 4.41 MIL/uL (ref 4.22–5.81)
RDW: 18.6 % — ABNORMAL HIGH (ref 11.5–15.5)
WBC: 3.4 10*3/uL — ABNORMAL LOW (ref 4.0–10.5)
nRBC: 0 % (ref 0.0–0.2)

## 2019-06-06 LAB — BASIC METABOLIC PANEL
Anion gap: 12 (ref 5–15)
BUN: 46 mg/dL — ABNORMAL HIGH (ref 8–23)
CO2: 26 mmol/L (ref 22–32)
Calcium: 8.7 mg/dL — ABNORMAL LOW (ref 8.9–10.3)
Chloride: 97 mmol/L — ABNORMAL LOW (ref 98–111)
Creatinine, Ser: 1.68 mg/dL — ABNORMAL HIGH (ref 0.61–1.24)
GFR calc Af Amer: 49 mL/min — ABNORMAL LOW (ref >60)
GFR calc non Af Amer: 43 mL/min — ABNORMAL LOW (ref >60)
Glucose, Bld: 131 mg/dL — ABNORMAL HIGH (ref 70–99)
Potassium: 3.7 mmol/L (ref 3.5–5.1)
Sodium: 135 mmol/L (ref 135–145)

## 2019-06-06 MED ORDER — SACUBITRIL-VALSARTAN 49-51 MG PO TABS
1.0000 | ORAL_TABLET | Freq: Two times a day (BID) | ORAL | Status: DC
  Administered 2019-06-06 – 2019-06-10 (×7): 1 via ORAL
  Filled 2019-06-06 (×8): qty 1

## 2019-06-06 MED ORDER — POTASSIUM CHLORIDE CRYS ER 20 MEQ PO TBCR
40.0000 meq | EXTENDED_RELEASE_TABLET | Freq: Once | ORAL | Status: AC
  Administered 2019-06-06: 40 meq via ORAL
  Filled 2019-06-06: qty 2

## 2019-06-06 MED ORDER — ASPIRIN EC 81 MG PO TBEC
81.0000 mg | DELAYED_RELEASE_TABLET | Freq: Every day | ORAL | Status: DC
  Administered 2019-06-06 – 2019-06-10 (×5): 81 mg via ORAL
  Filled 2019-06-06 (×5): qty 1

## 2019-06-06 MED ORDER — GABAPENTIN 300 MG PO CAPS
600.0000 mg | ORAL_CAPSULE | Freq: Two times a day (BID) | ORAL | Status: DC
  Administered 2019-06-06 – 2019-06-10 (×9): 600 mg via ORAL
  Filled 2019-06-06 (×9): qty 2

## 2019-06-06 MED ORDER — SACUBITRIL-VALSARTAN 49-51 MG PO TABS
1.0000 | ORAL_TABLET | Freq: Two times a day (BID) | ORAL | Status: DC
  Filled 2019-06-06: qty 1

## 2019-06-06 MED ORDER — SACUBITRIL-VALSARTAN 24-26 MG PO TABS
1.0000 | ORAL_TABLET | Freq: Once | ORAL | Status: AC
  Administered 2019-06-06: 1 via ORAL
  Filled 2019-06-06: qty 1

## 2019-06-06 NOTE — Telephone Encounter (Addendum)
Spoke with patient today via phone for [facility] week 112 visit. Pt currently in Hospital for Acute on chronic diastolic CHF. Pt states "I doing better. I've been putting off a lot of fluid with all the IV Lasix I have been getting." " I don't have the meds you all gave me anymore.I took all the pills and  trashed the boxes when I moved to [place] last year. That's why I stopped coming for my appointments."  Patient informed that the study is ending in July. Will call for EOS visit in late June.

## 2019-06-06 NOTE — Progress Notes (Signed)
PROGRESS NOTE    Patrick Stewart  MWU:[id number] DOB: 03/04/1956 DOA: 06/04/2019 PCP: Shelda Pal, DO    Brief Narrative:  63 year old male who presented with lower extremity edema, and worsening dyspnea.  He does have significant past medical history for coronary artery disease, systolic heart failure, chronic kidney disease, COPD, hypertension, dyslipidemia and type 2 diabetes mellitus.  He reported 7 days of worsening extremity edema, increased abdominal girth, dyspnea on exertion, PND and orthopnea.  His symptoms were persistent despite increased dose of oral diuretics at home.  On his initial physical examination his temperature was 98.3, heart rate 78, respiratory rate 24, blood pressure 168/103, oxygen saturation 96% on room air.  He had bibasilar Rales, heart S1-S2 present and rhythmic, the abdomen was soft nontender, he had 3+ pitting edema bilaterally.  3 5, potassium 3.8, chloride 99, bicarb 26, glucose 156, BUN 49, creatinine 2.0, BNP 1580, white count 4.3, hemoglobin 9.0, hematocrit 35.0, platelets 181, troponin 0.09.  SARS COVID-19 was negative.  Chest x-ray with significant cardiomegaly, no infiltrates.  EKG 78 bpm, 100% V paced, J-point elevation in V4 to V6, no significant T wave changes.  Patient was admitted to the hospital working diagnosis of acute exacerbation of heart failure.  Assessment & Plan:   Principal Problem:   Acute on chronic diastolic CHF (congestive heart failure) (HCC) Active Problems:   CKD (chronic kidney disease), stage III (HCC)   CAD Status post CABG 2 SVG to OM 1, LIMA to mid LAD:   Essential hypertension   AKI (acute kidney injury) ([place])   Uncontrolled type 2 diabetes mellitus ([place])   Acute on chronic combined systolic and diastolic CHF (congestive heart failure) ([place])   1. Acute systolic heart failure exacerbation. Patient with improved symptoms but still clinically hypervolemic and not at his baseline, Severely reduced systolic function  with EF LV 20 to 25%, mildly dilated cavity and increased left ventricular wall thickness. Patient with medical noncompliance and dietary indiscretions at home.  His urine output over last 24 H is 2,400 ml, blood pressure 135 to 140 mmHg. Will continue aggressive diuresis with IV furosemide 40 mg q 8 H. Continue sacubitril valsartan combination. Continue digoxin and carvedilol. Consult dietary for heart failure teaching.   2. AKI on CKD stage 3 with hypokalemia Renal function with serum cr down to 1,68 from admission 2,0, K at 3,7, will continue aggressive diuresis with furosemide, will follow on renal panel in am, avoid hypotension and nephrotoxic medications. Continue K correction with Kcl.   3. T2DM. His fasting glucose is 131, will continue insulin sliding scale for glucose cover and monitoring. Patient is tolerating po well.   4. Dyslipidemia. Continue atorvastatin therapy.   DVT prophylaxis: enoxaparin   Code Status: full Family Communication: no family at the bedside  Disposition Plan/ discharge barriers: pending clinical improvement   Body mass index is 26.47 kg/m. Malnutrition Type:      Malnutrition Characteristics:      Nutrition Interventions:     RN Pressure Injury Documentation:     Consultants:   Cardiology   Procedures:     Antimicrobials:       Subjective: Patient with improved symptoms but not yet back to baseline, continue to have dyspnea, orthopnea and lower extremity edema, no chest pain, no nausea or vomiting.   Objective: Vitals:   06/05/19 2103 06/06/19 0639 06/06/19 0845 06/06/19 1234  BP: 139/83 (!) 143/94 140/80 135/79  Pulse: 64 64 66 60  Resp: 20 20  18  Temp: 99.4 F (37.4 C) 97.6 F (36.4 C)  97.6 F (36.4 C)  TempSrc: Oral Oral  Oral  SpO2: 95% 98% 94% 100%  Weight:  83.7 kg    Height:        Intake/Output Summary (Last 24 hours) at 06/06/2019 1357 Last data filed at 06/06/2019 1235 Gross per 24 hour  Intake 843 ml   Output 4600 ml  Net -3757 ml   Filed Weights   06/04/19 2227 06/05/19 0911 06/06/19 0639  Weight: 86.2 kg 88.6 kg 83.7 kg    Examination:   General: deconditioned  Neurology: Awake and alert, non focal  E ENT: mild pallor, no icterus, oral mucosa moist Cardiovascular: Positive moderate JVD. S1-S2 present, rhythmic, no gallops, rubs, or murmurs. +++ pitting lower extremity edema. Pulmonary: positive breath sounds bilaterally, no wheezing, rhonchi or rales. Decreased breath sounds at bases.  Gastrointestinal. Abdomen with no organomegaly, non tender, no rebound or guarding Skin. No rashes Musculoskeletal: no joint deformities     Data Reviewed: I have personally reviewed following labs and imaging studies  CBC: Recent Labs  Lab 06/04/19 2228 06/05/19 0821 06/06/19 0440  WBC 4.3 3.3* 3.4*  NEUTROABS 2.7  --   --   HGB 11.0* 11.8* 12.8*  HCT 35.0* 37.8* 41.4  MCV 92.6 95.5 93.9  PLT 181 175 425   Basic Metabolic Panel: Recent Labs  Lab 06/04/19 2228 06/05/19 0821 06/06/19 0440  NA 135  --  135  K 3.8  --  3.7  CL 99  --  97*  CO2 26  --  26  GLUCOSE 156*  --  131*  BUN 49*  --  46*  CREATININE 2.06* 1.73* 1.68*  CALCIUM 8.6*  --  8.7*   GFR: Estimated Creatinine Clearance: 46.5 mL/min (A) (by C-G formula based on SCr of 1.68 mg/dL (H)). Liver Function Tests: No results for input(s): AST, ALT, ALKPHOS, BILITOT, PROT, ALBUMIN in the last 168 hours. No results for input(s): LIPASE, AMYLASE in the last 168 hours. No results for input(s): AMMONIA in the last 168 hours. Coagulation Profile: No results for input(s): INR, PROTIME in the last 168 hours. Cardiac Enzymes: Recent Labs  Lab 06/04/19 2229 06/05/19 0821 06/05/19 1333 06/05/19 1856  TROPONINI 0.09* 0.10* 0.09* 0.08*   BNP (last 3 results) No results for input(s): PROBNP in the last 8760 hours. HbA1C: Recent Labs    06/05/19 0821  HGBA1C 6.8*   CBG: Recent Labs  Lab 06/05/19 1706 06/05/19  1740 06/05/19 2108 06/06/19 0748 06/06/19 1144  GLUCAP 67* 136* 125* 109* 113*   Lipid Profile: No results for input(s): CHOL, HDL, LDLCALC, TRIG, CHOLHDL, LDLDIRECT in the last 72 hours. Thyroid Function Tests: No results for input(s): TSH, T4TOTAL, FREET4, T3FREE, THYROIDAB in the last 72 hours. Anemia Panel: No results for input(s): VITAMINB12, FOLATE, FERRITIN, TIBC, IRON, RETICCTPCT in the last 72 hours.    Radiology Studies: I have reviewed all of the imaging during this hospital visit personally     Scheduled Meds: . allopurinol  300 mg Oral Daily  . aspirin EC  81 mg Oral Daily  . atorvastatin  80 mg Oral Daily  . carvedilol  6.25 mg Oral BID WC  . Chlorhexidine Gluconate Cloth  6 each Topical Q0600  . digoxin  125 mcg Oral Daily  . enoxaparin (LOVENOX) injection  40 mg Subcutaneous Q24H  . furosemide  40 mg Intravenous Q8H  . gabapentin  600 mg Oral BID  . insulin aspart  0-15 Units Subcutaneous TID WC  . insulin aspart  0-5 Units Subcutaneous QHS  . mupirocin ointment  1 application Nasal BID  . sacubitril-valsartan  1 tablet Oral BID  . sodium chloride flush  3 mL Intravenous Q12H   Continuous Infusions: . sodium chloride       LOS: 1 day        Kenny Rea Gerome Apley, MD

## 2019-06-06 NOTE — Progress Notes (Signed)
Progress Note  Patient Name: Patrick Stewart Date of Encounter: 06/06/2019  Primary Cardiologist: Loralie Champagne, MD   Subjective   Feels much better today, but still volume up.  Inpatient Medications    Scheduled Meds: . allopurinol  300 mg Oral Daily  . aspirin EC  81 mg Oral Daily  . atorvastatin  80 mg Oral Daily  . carvedilol  6.25 mg Oral BID WC  . Chlorhexidine Gluconate Cloth  6 each Topical Q0600  . digoxin  125 mcg Oral Daily  . enoxaparin (LOVENOX) injection  40 mg Subcutaneous Q24H  . furosemide  40 mg Intravenous Q8H  . gabapentin  600 mg Oral BID  . insulin aspart  0-15 Units Subcutaneous TID WC  . insulin aspart  0-5 Units Subcutaneous QHS  . mupirocin ointment  1 application Nasal BID  . sacubitril-valsartan  1 tablet Oral BID  . sodium chloride flush  3 mL Intravenous Q12H   Continuous Infusions: . sodium chloride     PRN Meds: sodium chloride, acetaminophen, hydrALAZINE, ondansetron (ZOFRAN) IV, sodium chloride flush   Vital Signs    Vitals:   06/05/19 1418 06/05/19 1739 06/05/19 2103 06/06/19 0639  BP: (!) 160/92 (!) 159/95 139/83 (!) 143/94  Pulse: 67 69 64 64  Resp: 20  20 20   Temp: (!) 97.5 F (36.4 C) 98 F (36.7 C) 99.4 F (37.4 C) 97.6 F (36.4 C)  TempSrc: Oral  Oral Oral  SpO2: 98%  95% 98%  Weight:    83.7 kg  Height:        Intake/Output Summary (Last 24 hours) at 06/06/2019 0837 Last data filed at 06/05/2019 2226 Gross per 24 hour  Intake 963 ml  Output 2400 ml  Net -1437 ml   Last 3 Weights 06/06/2019 06/05/2019 06/04/2019  Weight (lbs) 184 lb 8 oz 195 lb 4.8 oz 190 lb  Weight (kg) 83.689 kg 88.587 kg 86.183 kg      Telemetry    V-paced rhythm in the 70s - Personally Reviewed  ECG    No new tracings - Personally Reviewed  Physical Exam   GEN: No acute distress.   Neck: minimal JVD Cardiac: RRR, no murmurs, rubs, or gallops.  Respiratory: respirations  Unlabored, clear bilaterally GI: Soft, nontender, non-distended   MS: + B LE edema; legs very tight, No deformity. Neuro:  Nonfocal  Psych: Normal affect   Labs    Chemistry Recent Labs  Lab 06/04/19 2228 06/05/19 0821 06/06/19 0440  NA 135  --  135  K 3.8  --  3.7  CL 99  --  97*  CO2 26  --  26  GLUCOSE 156*  --  131*  BUN 49*  --  46*  CREATININE 2.06* 1.73* 1.68*  CALCIUM 8.6*  --  8.7*  GFRNONAA 33* 41* 43*  GFRAA 39* 48* 49*  ANIONGAP 10  --  12     Hematology Recent Labs  Lab 06/04/19 2228 06/05/19 0821 06/06/19 0440  WBC 4.3 3.3* 3.4*  RBC 3.78* 3.96* 4.41  HGB 11.0* 11.8* 12.8*  HCT 35.0* 37.8* 41.4  MCV 92.6 95.5 93.9  MCH 29.1 29.8 29.0  MCHC 31.4 31.2 30.9  RDW 18.6* 19.2* 18.6*  PLT 181 175 211    Cardiac Enzymes Recent Labs  Lab 06/04/19 2229 06/05/19 0821 06/05/19 1333 06/05/19 1856  TROPONINI 0.09* 0.10* 0.09* 0.08*   No results for input(s): TROPIPOC in the last 168 hours.   BNP Recent Labs  Lab  06/04/19 2228  BNP 1,580.2*     DDimer No results for input(s): DDIMER in the last 168 hours.   Radiology    Dg Chest Port 1 View  Result Date: 06/04/2019 CLINICAL DATA:  63 year old male with history of bilateral lower extremity swelling. EXAM: PORTABLE CHEST 1 VIEW COMPARISON:  Chest x-ray 01/22/2019. FINDINGS: Lung volumes are normal. No consolidative airspace disease. No pleural effusions. Mild cephalization of the pulmonary vasculature, without frank pulmonary edema. Moderate cardiomegaly. Upper mediastinal contours are within normal limits. Aortic atherosclerosis. Status post median sternotomy for CABG. Left-sided biventricular pacemaker/AICD with lead tips projecting over the expected location of the right atrium, right ventricle and left ventricle via the coronary sinus and coronary veins. IMPRESSION: 1. Cardiomegaly with pulmonary venous congestion, but no frank pulmonary edema. 2. Aortic atherosclerosis. Electronically Signed   By: Vinnie Langton M.D.   On: 06/04/2019 23:13    Cardiac Studies    [facility] 04/15/2017  Left Main  30% stenosis.  Left Anterior Descending  90% mid LAD stenosis. Patent LIMA-LAD.  Left Circumflex  Moderate high OM1 with luminal irregularities. Small caliber distal LCx with moderate diffuse disease. 70-80% stenosis proximally in moderate OM2. Patent SVG-OM2.  Right Coronary Artery  30% proximal and mid RCA stenoses.   RA 9 RV 53/11 PA 52/17, mean 35 PCWP mean 14 LV 133/30 AO 136/86 Oxygen saturations: PA 60% AO 97% Cardiac Output (Fick) 4.12  Cardiac Index (Fick) 1.94   Echo 06/2018  Study Conclusions - Procedure narrative: Transthoracic echocardiography. Image quality was adequate. The study was technically difficult. - Left ventricle: The cavity size was severely dilated. Wall thickness was increased in a pattern of moderate LVH. Systolic function was moderately to severely reduced. The estimated ejection fraction was in the range of 30% to 35%. Diffuse hypokinesis. Regional wall motion abnormalities cannot be excluded. - Regional wall motion abnormality: Akinesis of the mid inferoseptal and entire inferior myocardium. - Aortic valve: There was moderate regurgitation. - Mitral valve: Calcified annulus. Mildly thickened leaflets . Mobility was mildly restricted. There was mild regurgitation. - Left atrium: The atrium was mildly to moderately dilated. - Right ventricle: The cavity size was severely dilated. Wall thickness was normal. Systolic function was reduced. - Right atrium: The atrium was moderately dilated. - Tricuspid valve: There was mild regurgitation. - Pulmonic valve: There was moderate regurgitation. - Pulmonary arteries: Systolic pressure was moderately to severely increased. PA peak pressure: 64 mm Hg (S).  Impressions: - Severe LV dysfunction with diffuse hypokinesis and akinesis of the inferior and inferoseptal walls. Biatrial enlargement, RV enlargement with reduced RV function. Elevated  right sided filling pressures. Moderate AR, mild MR and TR.  Patient Profile     63 y.o. male with a hx of ICM, CAD s/p CABG X 2 2010, COPD, DMII, gout, former smoker, HTN  who is being seen today for the evaluation of acute on chronic systolic HF.   Assessment & Plan    1. Acute on chronic systolic and diastolic heart failure 2. Ischemic cardiomyopathy 3. BiV CRT-D in place - continue ASA and lipitor - low dose of entresto 24-26 mg BID and coreg 6.25 mg BID restarted, digoxin continued - pressures have been elevated in the 130-160s - will further titrate entresto to his home dose of 49-51 mg BID today - HR in the 60s, will hold at current dose of coreg for now - if pressure tolerates higher dose of entresto, plan to restart spiro tomorrow - K 3.7 this morning -  overall net negative 3.2L with 2.4 L urine output - weight is 184 lbs from 195 lbs yesterday - continue lasix 40 mg IV q8 hr - will give one dose of potassium supplementation   4. Acute on CKD stage III  - sCr  1.68, down from 2.06 on admission - baseline appears to be 1.6-1.8   5. CAD s/p CABG x 3 (2010) 6. Elevated troponin - troponin: 0.09 --> 0.10 --> 0.09 --> 0.08 - no chest pain   7.  COPD 8.  Former smoker        For questions or updates, please contact [place] Please consult [email or web address] for contact info under        Signed, Ledora Bottcher, PA  06/06/2019, 8:37 AM

## 2019-06-06 NOTE — Progress Notes (Signed)
RN notified by telemetry patient had 9 beats of VTach.  Patient resting in bed - denies SOB, chest pain, dizziness.  VS obtained in Epic are WNL.  Dr. Cathlean Sauer notified via text page.

## 2019-06-07 ENCOUNTER — Other Ambulatory Visit: Payer: Self-pay | Admitting: Family Medicine

## 2019-06-07 LAB — BASIC METABOLIC PANEL
Anion gap: 12 (ref 5–15)
BUN: 41 mg/dL — ABNORMAL HIGH (ref 8–23)
CO2: 27 mmol/L (ref 22–32)
Calcium: 8.6 mg/dL — ABNORMAL LOW (ref 8.9–10.3)
Chloride: 95 mmol/L — ABNORMAL LOW (ref 98–111)
Creatinine, Ser: 1.68 mg/dL — ABNORMAL HIGH (ref 0.61–1.24)
GFR calc Af Amer: 49 mL/min — ABNORMAL LOW (ref >60)
GFR calc non Af Amer: 43 mL/min — ABNORMAL LOW (ref >60)
Glucose, Bld: 117 mg/dL — ABNORMAL HIGH (ref 70–99)
Potassium: 3.9 mmol/L (ref 3.5–5.1)
Sodium: 134 mmol/L — ABNORMAL LOW (ref 135–145)

## 2019-06-07 LAB — GLUCOSE, CAPILLARY
Glucose-Capillary: 114 mg/dL — ABNORMAL HIGH (ref 70–99)
Glucose-Capillary: 182 mg/dL — ABNORMAL HIGH (ref 70–99)
Glucose-Capillary: 140 mg/dL — ABNORMAL HIGH (ref 70–99)
Glucose-Capillary: 127 mg/dL — ABNORMAL HIGH (ref 70–99)

## 2019-06-07 NOTE — Progress Notes (Signed)
Progress Note  Patient Name: Patrick Stewart Date of Encounter: 06/07/2019  Primary Cardiologist: Loralie Champagne, MD   Subjective   He is diuresing well, still hypervolemic. We discussed dietary changes and compression hose.   Inpatient Medications    Scheduled Meds: . allopurinol  300 mg Oral Daily  . aspirin EC  81 mg Oral Daily  . atorvastatin  80 mg Oral Daily  . carvedilol  6.25 mg Oral BID WC  . Chlorhexidine Gluconate Cloth  6 each Topical Q0600  . digoxin  125 mcg Oral Daily  . enoxaparin (LOVENOX) injection  40 mg Subcutaneous Q24H  . furosemide  40 mg Intravenous Q8H  . gabapentin  600 mg Oral BID  . insulin aspart  0-15 Units Subcutaneous TID WC  . insulin aspart  0-5 Units Subcutaneous QHS  . mupirocin ointment  1 application Nasal BID  . sacubitril-valsartan  1 tablet Oral BID  . sodium chloride flush  3 mL Intravenous Q12H   Continuous Infusions: . sodium chloride     PRN Meds: sodium chloride, acetaminophen, hydrALAZINE, ondansetron (ZOFRAN) IV, sodium chloride flush   Vital Signs    Vitals:   06/06/19 1707 06/06/19 1823 06/06/19 2053 06/07/19 0558  BP: (!) 155/83 137/82 127/81 134/87  Pulse:  66 64 73  Resp:  18 14 16   Temp:   99 F (37.2 C) 98.6 F (37 C)  TempSrc:   Oral Oral  SpO2:  99% 98% 95%  Weight:    81.7 kg  Height:        Intake/Output Summary (Last 24 hours) at 06/07/2019 0754 Last data filed at 06/07/2019 0600 Gross per 24 hour  Intake 723 ml  Output 4100 ml  Net -3377 ml   Last 3 Weights 06/07/2019 06/06/2019 06/05/2019  Weight (lbs) 180 lb 1.6 oz 184 lb 8 oz 195 lb 4.8 oz  Weight (kg) 81.693 kg 83.689 kg 88.587 kg      Telemetry    V-pacing - Personally Reviewed  ECG    No new tracings - Personally Reviewed  Physical Exam   GEN: No acute distress.   Neck: + JVD Cardiac: RRR, no murmurs, rubs, or gallops.  Respiratory: respirations improved from yesterday, still diminished in bases. GI: Soft, nontender, non-distended   MS: 2+ pitting edema; No deformity. Neuro:  Nonfocal  Psych: Normal affect   Labs    Chemistry Recent Labs  Lab 06/04/19 2228 06/05/19 0821 06/06/19 0440  NA 135  --  135  K 3.8  --  3.7  CL 99  --  97*  CO2 26  --  26  GLUCOSE 156*  --  131*  BUN 49*  --  46*  CREATININE 2.06* 1.73* 1.68*  CALCIUM 8.6*  --  8.7*  GFRNONAA 33* 41* 43*  GFRAA 39* 48* 49*  ANIONGAP 10  --  12     Hematology Recent Labs  Lab 06/04/19 2228 06/05/19 0821 06/06/19 0440  WBC 4.3 3.3* 3.4*  RBC 3.78* 3.96* 4.41  HGB 11.0* 11.8* 12.8*  HCT 35.0* 37.8* 41.4  MCV 92.6 95.5 93.9  MCH 29.1 29.8 29.0  MCHC 31.4 31.2 30.9  RDW 18.6* 19.2* 18.6*  PLT 181 175 211    Cardiac Enzymes Recent Labs  Lab 06/04/19 2229 06/05/19 0821 06/05/19 1333 06/05/19 1856  TROPONINI 0.09* 0.10* 0.09* 0.08*   No results for input(s): TROPIPOC in the last 168 hours.   BNP Recent Labs  Lab 06/04/19 2228  BNP 1,580.2*  DDimer No results for input(s): DDIMER in the last 168 hours.   Radiology    No results found.  Cardiac Studies   [facility] 04/15/2017  Left Main  30% stenosis.  Left Anterior Descending  90% mid LAD stenosis. Patent LIMA-LAD.  Left Circumflex  Moderate high OM1 with luminal irregularities. Small caliber distal LCx with moderate diffuse disease. 70-80% stenosis proximally in moderate OM2. Patent SVG-OM2.  Right Coronary Artery  30% proximal and mid RCA stenoses.   RA 9 RV 53/11 PA 52/17, mean 35 PCWP mean 14 LV 133/30 AO 136/86 Oxygen saturations: PA 60% AO 97% Cardiac Output (Fick) 4.12  Cardiac Index (Fick) 1.94   Echo 06/2018  Study Conclusions - Procedure narrative: Transthoracic echocardiography. Image quality was adequate. The study was technically difficult. - Left ventricle: The cavity size was severely dilated. Wall thickness was increased in a pattern of moderate LVH. Systolic function was moderately to severely reduced. The estimated  ejection fraction was in the range of 30% to 35%. Diffuse hypokinesis. Regional wall motion abnormalities cannot be excluded. - Regional wall motion abnormality: Akinesis of the mid inferoseptal and entire inferior myocardium. - Aortic valve: There was moderate regurgitation. - Mitral valve: Calcified annulus. Mildly thickened leaflets . Mobility was mildly restricted. There was mild regurgitation. - Left atrium: The atrium was mildly to moderately dilated. - Right ventricle: The cavity size was severely dilated. Wall thickness was normal. Systolic function was reduced. - Right atrium: The atrium was moderately dilated. - Tricuspid valve: There was mild regurgitation. - Pulmonic valve: There was moderate regurgitation. - Pulmonary arteries: Systolic pressure was moderately to severely increased. PA peak pressure: 64 mm Hg (S).  Impressions: - Severe LV dysfunction with diffuse hypokinesis and akinesis of the inferior and inferoseptal walls. Biatrial enlargement, RV enlargement with reduced RV function. Elevated right sided filling pressures. Moderate AR, mild MR and TR.  Patient Profile     63 y.o. male with a hx of ICM, CAD s/p CABG X 2 2010, COPD, DMII, gout, former smoker, HTNwho is being seen for the management of acute on chronic systolic HF.  Assessment & Plan    1. Acute on chronic systolic heart failure 2. Ischemic cardiomyopathy 3. BiV CRT-D in place - he is diuresing on lasix 40 mg IV q 8 hr - he is overall net negative 6.6 L with 4.1 L urine output yesterday - weight is 180 lbs from 195 lbs on 6/8 - he states his dry weight at home is 187 lbs - he is still hypervolemic - will continue IV diuresis today - he is doing well on the increased dose of entresto 49-51 mg BID - pressure better controlled today - sCr still elevated, but stable - 1.68. Will hold off on increasing spironolactone today - we discussed diet and that he shouldn't eat  bacon, country ham, or fatback    4. Acute on chronic kidney disease stage III - sCr 1.68 (1.68), K 3.9 - baseline appears to be 1.6-1.8   5. CAD x/p CABG x 3 (2010) 6. Elevated troponin -  troponin: 0.09 --> 0.10 --> 0.09 --> 0.08 - likely demand ischemia in the setting of heart failure exacerbation - no anginal symptoms   7. COPD 8 Former smoker - complicating his respiratory status in the setting of CHF exacerbation     For questions or updates, please contact [place] Please consult [email or web address] for contact info under        Signed, Ledora Bottcher, PA  06/07/2019, 7:54 AM

## 2019-06-07 NOTE — Progress Notes (Signed)
PROGRESS NOTE    Patrick Stewart  AGT:[id number] DOB: Jan 14, 1956 DOA: 06/04/2019 PCP: Shelda Pal, DO    Brief Narrative:   63 year old male who presented with lower extremity edema, and worsening dyspnea.  He does have significant past medical history for coronary artery disease, systolic heart failure, chronic kidney disease, COPD, hypertension, dyslipidemia and type 2 diabetes mellitus.  He reported 7 days of worsening extremity edema, increased abdominal girth, dyspnea on exertion, PND and orthopnea.  His symptoms were persistent despite increased dose of oral diuretics at home.    On his initial physical examination his temperature was 98.3, heart rate 78, respiratory rate 24, blood pressure 168/103, oxygen saturation 96% on room air.  He had bibasilar Rales, heart S1-S2 present and rhythmic, the abdomen was soft nontender, he had 3+ pitting edema bilaterally.  3 5, potassium 3.8, chloride 99, bicarb 26, glucose 156, BUN 49, creatinine 2.0, BNP 1580, white count 4.3, hemoglobin 9.0, hematocrit 35.0, platelets 181, troponin 0.09.  SARS COVID-19 was negative.  Chest x-ray with significant cardiomegaly, no infiltrates.  EKG 78 bpm, 100% V paced, J-point elevation in V4 to V6, no significant T wave changes.  Patient was admitted to the hospital working diagnosis of acute exacerbation of heart failure.   Assessment & Plan:   Principal Problem:   Acute on chronic diastolic CHF (congestive heart failure) (HCC) Active Problems:   CKD (chronic kidney disease), stage III (HCC)   CAD Status post CABG 2 SVG to OM 1, LIMA to mid LAD:   Essential hypertension   AKI (acute kidney injury) ([place])   Uncontrolled type 2 diabetes mellitus ([place])   Acute on chronic combined systolic and diastolic CHF (congestive heart failure) (HCC)   Acute on chronic systolic congestive heart failure Patient presenting with progressive shortness of breath and lower extremity edema despite increased home oral  diuretics.  BNP elevated to 1580 on admission with chest x-ray with cardiomegaly.  Transthoracic echocardiogram with LVEF 20-25%, mildly dilated cavity and increased left ventricular wall thickness.  Etiology likely secondary to poor dietary habits outpatient and noncompliance with home medications. --Cardiology following, appreciate assistance --Continues with significant volume overload, lower extremity edema, elevated JVP --Net negative 3.4L past 24h, net negative 6.6L since admission --Weight down from 86.2kg to 81.7kg --Continue furosemide 40 mg IV every 8 hours --Continue Coreg 6.25 mg p.o. twice daily, digoxin 125 mcg p.o. daily, Entresto --Continue aspirin and statin --Strict I's and O's and daily weights  Acute renal failure on CKD stage III Creatinine admission 2.0.  The past year ranging from 1.36 to 2.10. Etiology likely secondary to severe volume overload. --Continue IV diuresis as above --Cr improving; 2.06-->1.73-->1.68 --Avoid nephrotoxins, renally dose all medications --Continue to monitor BMP daily  Type 2 diabetes mellitus On glipizide 5 mg p.o. daily outpatient.  A1c 6.8 on 06/05/2019, well controlled. --Hold oral hypoglycemics while inpatient --Consistent carbohydrate diet --Insulin sliding scale for coverage --Continue to monitor CBGs  Dyslipidemia --Continue atorvastatin 80 mg p.o. daily  DVT prophylaxis: Lovenox Code Status: Full code Family Communication: None Disposition Plan: Continue inpatient hospitalization, IV diuresis, further dependent on clinical course, pending PT evaluation   Consultants:   Cardiology  Procedures:  Transthoracic echocardiogram 06/05/2019: IMPRESSIONS   1. The left ventricle has severely reduced systolic function, with an ejection fraction of 20-25%. The cavity size was mildly dilated. There is mildly increased left ventricular wall thickness. Left ventricular diastolic Doppler parameters are consistent with pseudonormalization.  Inferior and inferoseptal akinesis, the other wall  segments are hypokinetic.  2. The right ventricle has moderately reduced systolic function. The cavity was normal. There is no increase in right ventricular wall thickness.  3. Left atrial size was moderately dilated.  4. Right atrial size was mildly dilated.  5. Trivial pericardial effusion is present.  6. There is a pleural effusion present.  7. The aortic valve is tricuspid. Mild calcification of the aortic valve. Aortic valve regurgitation is moderate by color flow Doppler. No stenosis of the aortic valve.  8. The aortic root is normal in size and structure.  9. The inferior vena cava was dilated in size with <50% respiratory variability. PA systolic pressure 60 mmHg. 10. No evidence of mitral valve stenosis. Trivial mitral regurgitation.  Antimicrobials:   None   Subjective: Patient seen and examined at bedside, continues with good urine output with IV diuresis.  Reports breathing much improved.  Continues with significant lower extremity edema.  No other specific complaints this morning.  Denies headache, no chest pain, no palpitations, no fever/chills/night sweats, no nausea/vomiting/diarrhea, no abdominal pain, no paresthesias.  No acute events overnight per nursing staff.  Objective: Vitals:   06/06/19 1823 06/06/19 2053 06/07/19 0558 06/07/19 1333  BP: 137/82 127/81 134/87 (!) 107/58  Pulse: 66 64 73 69  Resp: 18 14 16 18   Temp:  99 F (37.2 C) 98.6 F (37 C) 99.2 F (37.3 C)  TempSrc:  Oral Oral Oral  SpO2: 99% 98% 95% 98%  Weight:   81.7 kg   Height:        Intake/Output Summary (Last 24 hours) at 06/07/2019 1515 Last data filed at 06/07/2019 1400 Gross per 24 hour  Intake 720 ml  Output 3550 ml  Net -2830 ml   Filed Weights   06/05/19 0911 06/06/19 0639 06/07/19 0558  Weight: 88.6 kg 83.7 kg 81.7 kg    Examination:  General exam: Appears calm and comfortable  Respiratory system: Slightly decreased breath  sounds bilateral bases, no wheezing, normal respiratory effort on room air Cardiovascular system: S1 & S2 heard, RRR. No JVD, murmurs, rubs, gallops or clicks.  2+ pitting edema bilaterally lower extremities up to level of knee, noted elevated JVP Gastrointestinal system: Abdomen is nondistended, soft and nontender. No organomegaly or masses felt. Normal bowel sounds heard. Central nervous system: Alert and oriented. No focal neurological deficits. Extremities: Symmetric 5 x 5 power. Skin: No rashes, lesions or ulcers Psychiatry: Judgement and insight appear normal. Mood & affect appropriate.     Data Reviewed: I have personally reviewed following labs and imaging studies  CBC: Recent Labs  Lab 06/04/19 2228 06/05/19 0821 06/06/19 0440  WBC 4.3 3.3* 3.4*  NEUTROABS 2.7  --   --   HGB 11.0* 11.8* 12.8*  HCT 35.0* 37.8* 41.4  MCV 92.6 95.5 93.9  PLT 181 175 709   Basic Metabolic Panel: Recent Labs  Lab 06/04/19 2228 06/05/19 0821 06/06/19 0440 06/07/19 0712  NA 135  --  135 134*  K 3.8  --  3.7 3.9  CL 99  --  97* 95*  CO2 26  --  26 27  GLUCOSE 156*  --  131* 117*  BUN 49*  --  46* 41*  CREATININE 2.06* 1.73* 1.68* 1.68*  CALCIUM 8.6*  --  8.7* 8.6*   GFR: Estimated Creatinine Clearance: 46.5 mL/min (A) (by C-G formula based on SCr of 1.68 mg/dL (H)). Liver Function Tests: No results for input(s): AST, ALT, ALKPHOS, BILITOT, PROT, ALBUMIN in the last 168  hours. No results for input(s): LIPASE, AMYLASE in the last 168 hours. No results for input(s): AMMONIA in the last 168 hours. Coagulation Profile: No results for input(s): INR, PROTIME in the last 168 hours. Cardiac Enzymes: Recent Labs  Lab 06/04/19 2229 06/05/19 0821 06/05/19 1333 06/05/19 1856  TROPONINI 0.09* 0.10* 0.09* 0.08*   BNP (last 3 results) No results for input(s): PROBNP in the last 8760 hours. HbA1C: Recent Labs    06/05/19 0821  HGBA1C 6.8*   CBG: Recent Labs  Lab 06/06/19 1144  06/06/19 1634 06/06/19 2055 06/07/19 0754 06/07/19 1138  GLUCAP 113* 110* 160* 114* 182*   Lipid Profile: No results for input(s): CHOL, HDL, LDLCALC, TRIG, CHOLHDL, LDLDIRECT in the last 72 hours. Thyroid Function Tests: No results for input(s): TSH, T4TOTAL, FREET4, T3FREE, THYROIDAB in the last 72 hours. Anemia Panel: No results for input(s): VITAMINB12, FOLATE, FERRITIN, TIBC, IRON, RETICCTPCT in the last 72 hours. Sepsis Labs: No results for input(s): PROCALCITON, LATICACIDVEN in the last 168 hours.  Recent Results (from the past 240 hour(s))  SARS Coronavirus 2 (Hosp order,Performed in [facility] lab via [employer] [facility])     Status: None   Collection Time: 06/04/19 10:30 PM  Result Value Ref Range Status   SARS Coronavirus 2 ([employer] [facility] Now) NEGATIVE NEGATIVE Final    Comment: (NOTE) Interpretive Result Comment(s): COVID 19 Positive SARS CoV 2 target nucleic acids are DETECTED. The SARS CoV 2 RNA is generally detectable in upper and lower respiratory specimens during the acute phase of infection.  Positive results are indicative of active infection with SARS CoV 2.  Clinical correlation with patient history and other diagnostic information is necessary to determine patient infection status.  Positive results do not rule out bacterial infection or coinfection with other viruses. The expected result is Negative. COVID 19 Negative SARS CoV 2 target nucleic acids are NOT DETECTED. The SARS CoV 2 RNA is generally detectable in upper and lower respiratory specimens during the acute phase of infection.  Negative results do not preclude SARS CoV 2 infection, do not rule out coinfections with other pathogens, and should not be used as the sole basis for treatment or other patient management decisions.  Negative results must be combined with clinical  observations, patient history, and epidemiological information. The expected result is Negative. Invalid Presence or absence of SARS  CoV 2 nucleic acids cannot be determined. Repeat testing was performed on the submitted specimen and repeated Invalid results were obtained.  If clinically indicated, additional testing on a new specimen with an alternate test methodology [id number]) is advised.  The SARS CoV 2 RNA is generally detectable in upper and lower respiratory specimens during the acute phase of infection. The expected result is Negative. Fact Sheet for Patients:  GolfingFamily.no Fact Sheet for Healthcare Providers: https://www.hernandez-brewer.com/ This test is not yet approved or cleared by the [place] FDA and has been authorized for detection and/or diagnosis of SARS CoV 2 by FDA under an Emergency Use Authorization (EUA).  This EUA will remain in effect (meaning this test can be used) for the duration of the COVID19 d eclaration under Section 564(b)(1) of the Act, 21 U.S.C. section [phone] 3(b)(1), unless the authorization is terminated or revoked sooner. Performed at [facility], [address] [place], [place] [address]   MRSA PCR Screening     Status: Abnormal   Collection Time: 06/05/19  8:17 AM  Result Value Ref Range Status   MRSA by PCR POSITIVE (A)  NEGATIVE Final    Comment:        The GeneXpert MRSA Assay (FDA approved for NASAL specimens only), is one component of a comprehensive MRSA colonization surveillance program. It is not intended to diagnose MRSA infection nor to guide or monitor treatment for MRSA infections. RESULT CALLED TO, READ BACK BY AND VERIFIED WITH: RICHARDSON,K. RN @1753  ON 06.08.2020 BY COHEN,K Performed at [facility], [place] [address] [place], [place] [address]          Radiology Studies: No results found.      Scheduled Meds: . allopurinol  300 mg Oral Daily  . aspirin EC  81 mg Oral Daily  . atorvastatin  80 mg Oral Daily  . carvedilol  6.25 mg Oral BID WC  . Chlorhexidine  Gluconate Cloth  6 each Topical Q0600  . digoxin  125 mcg Oral Daily  . enoxaparin (LOVENOX) injection  40 mg Subcutaneous Q24H  . furosemide  40 mg Intravenous Q8H  . gabapentin  600 mg Oral BID  . insulin aspart  0-15 Units Subcutaneous TID WC  . insulin aspart  0-5 Units Subcutaneous QHS  . mupirocin ointment  1 application Nasal BID  . sacubitril-valsartan  1 tablet Oral BID  . sodium chloride flush  3 mL Intravenous Q12H   Continuous Infusions: . sodium chloride       LOS: 2 days    Time spent: 29 minutes    Patrick Stewart J [place] DO [facility] Pager [phone]  If 7PM-7AM, please contact night-coverage [email or web address] Password [email or web address] 06/07/2019, 3:15 PM

## 2019-06-08 LAB — BASIC METABOLIC PANEL
Anion gap: 12 (ref 5–15)
BUN: 40 mg/dL — ABNORMAL HIGH (ref 8–23)
CO2: 26 mmol/L (ref 22–32)
Calcium: 8.7 mg/dL — ABNORMAL LOW (ref 8.9–10.3)
Chloride: 94 mmol/L — ABNORMAL LOW (ref 98–111)
Creatinine, Ser: 1.83 mg/dL — ABNORMAL HIGH (ref 0.61–1.24)
GFR calc Af Amer: 45 mL/min — ABNORMAL LOW (ref >60)
GFR calc non Af Amer: 38 mL/min — ABNORMAL LOW (ref >60)
Glucose, Bld: 106 mg/dL — ABNORMAL HIGH (ref 70–99)
Potassium: 5 mmol/L (ref 3.5–5.1)
Sodium: 132 mmol/L — ABNORMAL LOW (ref 135–145)

## 2019-06-08 LAB — GLUCOSE, CAPILLARY
Glucose-Capillary: 102 mg/dL — ABNORMAL HIGH (ref 70–99)
Glucose-Capillary: 150 mg/dL — ABNORMAL HIGH (ref 70–99)
Glucose-Capillary: 130 mg/dL — ABNORMAL HIGH (ref 70–99)
Glucose-Capillary: 186 mg/dL — ABNORMAL HIGH (ref 70–99)

## 2019-06-08 LAB — URIC ACID: Uric Acid, Serum: 13.9 mg/dL — ABNORMAL HIGH (ref 3.7–8.6)

## 2019-06-08 MED ORDER — TRAMADOL HCL 50 MG PO TABS
50.0000 mg | ORAL_TABLET | Freq: Four times a day (QID) | ORAL | Status: DC | PRN
  Filled 2019-06-08: qty 1

## 2019-06-08 MED ORDER — TORSEMIDE 20 MG PO TABS
40.0000 mg | ORAL_TABLET | Freq: Every day | ORAL | Status: DC
  Administered 2019-06-09 – 2019-06-10 (×2): 40 mg via ORAL
  Filled 2019-06-08 (×2): qty 2

## 2019-06-08 MED ORDER — PREDNISONE 5 MG PO TABS
30.0000 mg | ORAL_TABLET | Freq: Every day | ORAL | Status: DC
  Administered 2019-06-08 – 2019-06-09 (×2): 30 mg via ORAL
  Filled 2019-06-08 (×3): qty 1

## 2019-06-08 MED ORDER — OXYCODONE HCL 5 MG PO TABS
5.0000 mg | ORAL_TABLET | Freq: Four times a day (QID) | ORAL | Status: DC | PRN
  Administered 2019-06-08 – 2019-06-09 (×2): 5 mg via ORAL
  Filled 2019-06-08 (×2): qty 1

## 2019-06-08 NOTE — Progress Notes (Signed)
Progress Note  Patient Name: Patrick Stewart Date of Encounter: 06/08/2019  Primary Cardiologist: Loralie Champagne, MD   Subjective   Breathing better. States swelling is gone. Slept well last night. Says he feels 100% better, except that the is having left knee pain that he thinks related to gout.   Inpatient Medications    Scheduled Meds: . allopurinol  300 mg Oral Daily  . aspirin EC  81 mg Oral Daily  . atorvastatin  80 mg Oral Daily  . carvedilol  6.25 mg Oral BID WC  . Chlorhexidine Gluconate Cloth  6 each Topical Q0600  . digoxin  125 mcg Oral Daily  . enoxaparin (LOVENOX) injection  40 mg Subcutaneous Q24H  . furosemide  40 mg Intravenous Q8H  . gabapentin  600 mg Oral BID  . insulin aspart  0-15 Units Subcutaneous TID WC  . insulin aspart  0-5 Units Subcutaneous QHS  . mupirocin ointment  1 application Nasal BID  . sacubitril-valsartan  1 tablet Oral BID  . sodium chloride flush  3 mL Intravenous Q12H   Continuous Infusions: . sodium chloride     PRN Meds: sodium chloride, acetaminophen, hydrALAZINE, ondansetron (ZOFRAN) IV, sodium chloride flush   Vital Signs    Vitals:   06/07/19 0558 06/07/19 1333 06/07/19 2227 06/08/19 0553  BP: 134/87 (!) 107/58 139/80 (!) 143/87  Pulse: 73 69 62 68  Resp: 16 18 18 14   Temp: 98.6 F (37 C) 99.2 F (37.3 C) 98.7 F (37.1 C) 98.6 F (37 C)  TempSrc: Oral Oral Oral Oral  SpO2: 95% 98% 94% 93%  Weight: 81.7 kg   79.4 kg  Height:        Intake/Output Summary (Last 24 hours) at 06/08/2019 0752 Last data filed at 06/08/2019 0555 Gross per 24 hour  Intake 720 ml  Output 3575 ml  Net -2855 ml   Last 3 Weights 06/08/2019 06/07/2019 06/06/2019  Weight (lbs) 175 lb 1.6 oz 180 lb 1.6 oz 184 lb 8 oz  Weight (kg) 79.425 kg 81.693 kg 83.689 kg      Telemetry    V pacing, occ AV pacing 70's. PVCs - Personally Reviewed  ECG    No new tracings - Personally Reviewed  Physical Exam   GEN: No acute distress.   Neck: 1 cm  JVD Cardiac: RRR, no murmurs, rubs, or gallops.  Respiratory: Clear to auscultation bilaterally. GI: Soft, nontender, non-distended  MS: Tace LE edema; No deformity. Neuro:  Nonfocal  Psych: Normal affect   Labs    Chemistry Recent Labs  Lab 06/04/19 2228 06/05/19 0821 06/06/19 0440 06/07/19 0712  NA 135  --  135 134*  K 3.8  --  3.7 3.9  CL 99  --  97* 95*  CO2 26  --  26 27  GLUCOSE 156*  --  131* 117*  BUN 49*  --  46* 41*  CREATININE 2.06* 1.73* 1.68* 1.68*  CALCIUM 8.6*  --  8.7* 8.6*  GFRNONAA 33* 41* 43* 43*  GFRAA 39* 48* 49* 49*  ANIONGAP 10  --  12 12     Hematology Recent Labs  Lab 06/04/19 2228 06/05/19 0821 06/06/19 0440  WBC 4.3 3.3* 3.4*  RBC 3.78* 3.96* 4.41  HGB 11.0* 11.8* 12.8*  HCT 35.0* 37.8* 41.4  MCV 92.6 95.5 93.9  MCH 29.1 29.8 29.0  MCHC 31.4 31.2 30.9  RDW 18.6* 19.2* 18.6*  PLT 181 175 211    Cardiac Enzymes Recent Labs  Lab 06/04/19 2229 06/05/19 0821 06/05/19 1333 06/05/19 1856  TROPONINI 0.09* 0.10* 0.09* 0.08*   No results for input(s): TROPIPOC in the last 168 hours.   BNP Recent Labs  Lab 06/04/19 2228  BNP 1,580.2*     DDimer No results for input(s): DDIMER in the last 168 hours.   Radiology    No results found.  Cardiac Studies   Echocardiogram 06/05/2019 IMPRESSIONS  1. The left ventricle has severely reduced systolic function, with an ejection fraction of 20-25%. The cavity size was mildly dilated. There is mildly increased left ventricular wall thickness. Left ventricular diastolic Doppler parameters are  consistent with pseudonormalization. Inferior and inferoseptal akinesis, the other wall segments are hypokinetic.  2. The right ventricle has moderately reduced systolic function. The cavity was normal. There is no increase in right ventricular wall thickness.  3. Left atrial size was moderately dilated.  4. Right atrial size was mildly dilated.  5. Trivial pericardial effusion is present.  6. There  is a pleural effusion present.  7. The aortic valve is tricuspid. Mild calcification of the aortic valve. Aortic valve regurgitation is moderate by color flow Doppler. No stenosis of the aortic valve.  8. The aortic root is normal in size and structure.  9. The inferior vena cava was dilated in size with <50% respiratory variability. PA systolic pressure 60 mmHg. 10. No evidence of mitral valve stenosis. Trivial mitral regurgitation.  [facility] 04/15/2017  Left Main  30% stenosis.  Left Anterior Descending  90% mid LAD stenosis. Patent LIMA-LAD.  Left Circumflex  Moderate high OM1 with luminal irregularities. Small caliber distal LCx with moderate diffuse disease. 70-80% stenosis proximally in moderate OM2. Patent SVG-OM2.  Right Coronary Artery  30% proximal and mid RCA stenoses.   RA 9 RV 53/11 PA 52/17, mean 35 PCWP mean 14 LV 133/30 AO 136/86 Oxygen saturations: PA 60% AO 97% Cardiac Output (Fick) 4.12  Cardiac Index (Fick) 1.94   Echo 06/2018 Study Conclusions - Procedure narrative: Transthoracic echocardiography. Image quality was adequate. The study was technically difficult. - Left ventricle: The cavity size was severely dilated. Wall thickness was increased in a pattern of moderate LVH. Systolic function was moderately to severely reduced. The estimated ejection fraction was in the range of 30% to 35%. Diffuse hypokinesis. Regional wall motion abnormalities cannot be excluded. - Regional wall motion abnormality: Akinesis of the mid inferoseptal and entire inferior myocardium. - Aortic valve: There was moderate regurgitation. - Mitral valve: Calcified annulus. Mildly thickened leaflets . Mobility was mildly restricted. There was mild regurgitation. - Left atrium: The atrium was mildly to moderately dilated. - Right ventricle: The cavity size was severely dilated. Wall thickness was normal. Systolic function was reduced. - Right atrium: The atrium  was moderately dilated. - Tricuspid valve: There was mild regurgitation. - Pulmonic valve: There was moderate regurgitation. - Pulmonary arteries: Systolic pressure was moderately to severely increased. PA peak pressure: 64 mm Hg (S).  Impressions: - Severe LV dysfunction with diffuse hypokinesis and akinesis of the inferior and inferoseptal walls. Biatrial enlargement, RV enlargement with reduced RV function. Elevated right sided filling pressures. Moderate AR, mild MR and TR.   Patient Profile     63 y.o. male with a hx of ICM, CAD s/p CABG X 2 2010, COPD, DMII, gout, former smoker, HTNwho is being seen for the management of acute on chronic systolic HF.   Assessment & Plan    1. Acute on chronic combined systolic and diastolic heart failure/ Ischemic  CM -Echo shows EF 20-25%, mildly dilated LV, mildly increased LV wall thickness, diastolic dysfunction, Inferior and inferoseptal akinesis, other wall segments are hypokinetic. Also moderately reduced RV systolic function. Prior EF 30-35% in 06/2018, has been down to 10% in the past.  -Pt has BiV CRT-D in place. -Being diuresed with lasix 40 mg IV q 8h, with god diuresis.  -3.5L UOP yesterday. Net negative 9.5L uop since admission.  -Wt down 5 lbs from yesterday, Down 20 lbs from peak, 195>>175 lbs.  -Continues on carvedilol 6.25 mg, digoxin 125 mcg,  -Entresto dose increased to 49-51 mg BID. Tolerating well.  -Not initiating spironolactone yet due to renal function. Awaiting labs for today.  -Home diuretic is torsemide 40 mg daily. Fluid status may improve with increase in Entresto. Also addition of spironolactone would help. So may be able to continue with prior torsemide vs increasing. Will defer to Dr. Harrell Gave.  -Breathing and edema much improved. SCr up today. Probably ready to Transition to oral diuretic today. Hold lasix for remainder of today and start torsemide 40 mg tomorrow-To review with Dr. Harrell Gave.   -Will plan for follow up with HF clinic next week.    2. Acute on chronic kidney disease stage III -SCr rose to 2.06, improved to 1.68 yest. SCr up to 1.83 today, likely related to strong diuresis and nearing dry.   3. CAD s/p CABG X3 2010 -Troponins mildly elevated in flat pattern consistent with demand ischemia in setting of acute heart failure exacerbation.  -No anginal symptoms -continues on aspirin, high intensity statin, beta blocker  4. COPD -Former smoker -Stable  5. Diabetes type 2 -A1c 6.8. Improved control since earlier this year.   6. Hyperlipidemia -On high intensity statin with atorvastatin 80 mg -LDL was 134 in 01/2018. Will need updated lipid panel.  Target LDL <70.   7. Presence of CRT-D -Followed by Dr. Curt Bears. Pt reports needs office F/U in July. I will arrange for this.      For questions or updates, please contact [place] Please consult [email or web address] for contact info under        Signed, Daune Perch, NP  06/08/2019, 7:52 AM

## 2019-06-08 NOTE — Progress Notes (Signed)
PROGRESS NOTE    Patrick Stewart  SHF:[id number] DOB: [birth year] DOA: 06/04/2019 PCP: Shelda Pal, DO    Brief Narrative:   63 year old male who presented with lower extremity edema, and worsening dyspnea.  He does have significant past medical history for coronary artery disease, systolic heart failure, chronic kidney disease, COPD, hypertension, dyslipidemia and type 2 diabetes mellitus.  He reported 7 days of worsening extremity edema, increased abdominal girth, dyspnea on exertion, PND and orthopnea.  His symptoms were persistent despite increased dose of oral diuretics at home.    On his initial physical examination his temperature was 98.3, heart rate 78, respiratory rate 24, blood pressure 168/103, oxygen saturation 96% on room air.  He had bibasilar Rales, heart S1-S2 present and rhythmic, the abdomen was soft nontender, he had 3+ pitting edema bilaterally.  3 5, potassium 3.8, chloride 99, bicarb 26, glucose 156, BUN 49, creatinine 2.0, BNP 1580, white count 4.3, hemoglobin 9.0, hematocrit 35.0, platelets 181, troponin 0.09.  SARS COVID-19 was negative.  Chest x-ray with significant cardiomegaly, no infiltrates.  EKG 78 bpm, 100% V paced, J-point elevation in V4 to V6, no significant T wave changes.  Patient was admitted to the hospital working diagnosis of acute exacerbation of heart failure.   Assessment & Plan:   Principal Problem:   Acute on chronic diastolic CHF (congestive heart failure) (HCC) Active Problems:   CKD (chronic kidney disease), stage III (HCC)   CAD Status post CABG 2 SVG to OM 1, LIMA to mid LAD:   Essential hypertension   AKI (acute kidney injury) ([place])   Uncontrolled type 2 diabetes mellitus ([place])   Acute on chronic combined systolic and diastolic CHF (congestive heart failure) (HCC)   Acute on chronic systolic congestive heart failure Patient presenting with progressive shortness of breath and lower extremity edema despite increased home oral  diuretics.  BNP elevated to 1580 on admission with chest x-ray with cardiomegaly.  Transthoracic echocardiogram with LVEF 20-25%, mildly dilated cavity and increased left ventricular wall thickness.  Etiology likely secondary to poor dietary habits outpatient and noncompliance with home medications. --Cardiology following, appreciate assistance --Continues with significant volume overload, lower extremity edema, elevated JVP --Net negative 2.9L past 24h, net negative 9.5L since admission --Weight down from 86.2kg to 79.4kg --Continue Coreg 6.25 mg p.o. twice daily, digoxin 125 mcg p.o. daily, Entresto --Holding furosemide today for bump in creatinine, will start torsemide 40 mg p.o. daily per cardiology tomorrow --Continue aspirin and statin --Strict I's and O's and daily weights  Acute renal failure on CKD stage III Creatinine admission 2.0.  The past year ranging from 1.36 to 2.10. Etiology likely secondary to severe volume overload. --Continue IV diuresis as above --Cr 2.06-->1.73-->1.68-->1.83; mild elevation past 24 hours likely due to diuresis and getting close to euvolemia --Holding further IV diuresis today, plan to start torsemide tomorrow --Avoid nephrotoxins, renally dose all medications --Continue to monitor BMP daily  Acute gout flare She reports pain to left knee, feels that this is typical of an acute gout exacerbation.  Uric acid elevated to 13.9. --Start prednisone 30 mg p.o. daily --Tramadol PRN for pain  Type 2 diabetes mellitus On glipizide 5 mg p.o. daily outpatient.  A1c 6.8 on 06/05/2019, well controlled. --Hold oral hypoglycemics while inpatient --Consistent carbohydrate diet --Insulin sliding scale for coverage --Continue to monitor CBGs closely especially in the setting of starting oral prednisone for acute gout flare as above.  Dyslipidemia --Continue atorvastatin 80 mg p.o. daily  DVT prophylaxis: Lovenox  Code Status: Full code Family Communication:  None Disposition Plan: Continue inpatient hospitalization, plan to transition to oral diuretic tomorrow, further dependent on clinical course, pending PT evaluation   Consultants:   Cardiology  Procedures:  Transthoracic echocardiogram 06/05/2019: IMPRESSIONS   1. The left ventricle has severely reduced systolic function, with an ejection fraction of 20-25%. The cavity size was mildly dilated. There is mildly increased left ventricular wall thickness. Left ventricular diastolic Doppler parameters are consistent with pseudonormalization. Inferior and inferoseptal akinesis, the other wall segments are hypokinetic.  2. The right ventricle has moderately reduced systolic function. The cavity was normal. There is no increase in right ventricular wall thickness.  3. Left atrial size was moderately dilated.  4. Right atrial size was mildly dilated.  5. Trivial pericardial effusion is present.  6. There is a pleural effusion present.  7. The aortic valve is tricuspid. Mild calcification of the aortic valve. Aortic valve regurgitation is moderate by color flow Doppler. No stenosis of the aortic valve.  8. The aortic root is normal in size and structure.  9. The inferior vena cava was dilated in size with <50% respiratory variability. PA systolic pressure 60 mmHg. 10. No evidence of mitral valve stenosis. Trivial mitral regurgitation.  Antimicrobials:   None   Subjective:  Patient seen and examined at bedside, complains of left knee pain.  Concerned about an acute gout flare.  Reports good diuresis last 24 hours.  Feels he is lost a lot of weight.  Continues with good urine output with IV diuresis.  Reports breathing much improved. No other specific complaints this morning.  Denies headache, no chest pain, no palpitations, no fever/chills/night sweats, no nausea/vomiting/diarrhea, no abdominal pain, no paresthesias.  No acute events overnight per nursing staff.  Objective: Vitals:   06/07/19  0558 06/07/19 1333 06/07/19 2227 06/08/19 0553  BP: 134/87 (!) 107/58 139/80 (!) 143/87  Pulse: 73 69 62 68  Resp: 16 18 18 14   Temp: 98.6 F (37 C) 99.2 F (37.3 C) 98.7 F (37.1 C) 98.6 F (37 C)  TempSrc: Oral Oral Oral Oral  SpO2: 95% 98% 94% 93%  Weight: 81.7 kg   79.4 kg  Height:        Intake/Output Summary (Last 24 hours) at 06/08/2019 1239 Last data filed at 06/08/2019 1213 Gross per 24 hour  Intake 840 ml  Output 2900 ml  Net -2060 ml   Filed Weights   06/06/19 0639 06/07/19 0558 06/08/19 0553  Weight: 83.7 kg 81.7 kg 79.4 kg    Examination:  General exam: Appears calm and comfortable  Respiratory system: Slightly decreased breath sounds bilateral bases, no wheezing, normal respiratory effort on room air Cardiovascular system: S1 & S2 heard, RRR. No JVD, murmurs, rubs, gallops or clicks.  1+ pitting edema bilaterally lower extremities up to level of knee, noted elevated JVP Gastrointestinal system: Abdomen is nondistended, soft and nontender. No organomegaly or masses felt. Normal bowel sounds heard. Central nervous system: Alert and oriented. No focal neurological deficits. Extremities: Symmetric 5 x 5 power. Skin: No rashes, lesions or ulcers Psychiatry: Judgement and insight appear normal. Mood & affect appropriate.     Data Reviewed: I have personally reviewed following labs and imaging studies  CBC: Recent Labs  Lab 06/04/19 2228 06/05/19 0821 06/06/19 0440  WBC 4.3 3.3* 3.4*  NEUTROABS 2.7  --   --   HGB 11.0* 11.8* 12.8*  HCT 35.0* 37.8* 41.4  MCV 92.6 95.5 93.9  PLT 181 175 211  Basic Metabolic Panel: Recent Labs  Lab 06/04/19 2228 06/05/19 0821 06/06/19 0440 06/07/19 0712 06/08/19 0817  NA 135  --  135 134* 132*  K 3.8  --  3.7 3.9 5.0  CL 99  --  97* 95* 94*  CO2 26  --  26 27 26   GLUCOSE 156*  --  131* 117* 106*  BUN 49*  --  46* 41* 40*  CREATININE 2.06* 1.73* 1.68* 1.68* 1.83*  CALCIUM 8.6*  --  8.7* 8.6* 8.7*    GFR: Estimated Creatinine Clearance: 42.7 mL/min (A) (by C-G formula based on SCr of 1.83 mg/dL (H)). Liver Function Tests: No results for input(s): AST, ALT, ALKPHOS, BILITOT, PROT, ALBUMIN in the last 168 hours. No results for input(s): LIPASE, AMYLASE in the last 168 hours. No results for input(s): AMMONIA in the last 168 hours. Coagulation Profile: No results for input(s): INR, PROTIME in the last 168 hours. Cardiac Enzymes: Recent Labs  Lab 06/04/19 2229 06/05/19 0821 06/05/19 1333 06/05/19 1856  TROPONINI 0.09* 0.10* 0.09* 0.08*   BNP (last 3 results) No results for input(s): PROBNP in the last 8760 hours. HbA1C: No results for input(s): HGBA1C in the last 72 hours. CBG: Recent Labs  Lab 06/07/19 1138 06/07/19 1610 06/07/19 2233 06/08/19 0725 06/08/19 1113  GLUCAP 182* 140* 127* 102* 150*   Lipid Profile: No results for input(s): CHOL, HDL, LDLCALC, TRIG, CHOLHDL, LDLDIRECT in the last 72 hours. Thyroid Function Tests: No results for input(s): TSH, T4TOTAL, FREET4, T3FREE, THYROIDAB in the last 72 hours. Anemia Panel: No results for input(s): VITAMINB12, FOLATE, FERRITIN, TIBC, IRON, RETICCTPCT in the last 72 hours. Sepsis Labs: No results for input(s): PROCALCITON, LATICACIDVEN in the last 168 hours.  Recent Results (from the past 240 hour(s))  SARS Coronavirus 2 (Hosp order,Performed in [facility] lab via [employer] ID)     Status: None   Collection Time: 06/04/19 10:30 PM   Specimen: Dry Nasal Swab ([employer] Now)  Result Value Ref Range Status   SARS Coronavirus 2 ([employer] Now) NEGATIVE NEGATIVE Final    Comment: (NOTE) Interpretive Result Comment(s): COVID 19 Positive SARS CoV 2 target nucleic acids are DETECTED. The SARS CoV 2 RNA is generally detectable in upper and lower respiratory specimens during the acute phase of infection.  Positive results are indicative of active infection with SARS CoV 2.  Clinical correlation with patient history and  other diagnostic information is necessary to determine patient infection status.  Positive results do not rule out bacterial infection or coinfection with other viruses. The expected result is Negative. COVID 19 Negative SARS CoV 2 target nucleic acids are NOT DETECTED. The SARS CoV 2 RNA is generally detectable in upper and lower respiratory specimens during the acute phase of infection.  Negative results do not preclude SARS CoV 2 infection, do not rule out coinfections with other pathogens, and should not be used as the sole basis for treatment or other patient management decisions.  Negative results must be combined with clinical  observations, patient history, and epidemiological information. The expected result is Negative. Invalid Presence or absence of SARS CoV 2 nucleic acids cannot be determined. Repeat testing was performed on the submitted specimen and repeated Invalid results were obtained.  If clinically indicated, additional testing on a new specimen with an alternate test methodology [phone]) is advised.  The SARS CoV 2 RNA is generally detectable in upper and lower respiratory specimens during the acute phase of infection. The expected result is Negative. Fact  Sheet for Patients:  G[employer]ing[employer].no Fact Sheet for Healthcare Providers: https://www.hernandez-brewer.com/ This test is not yet approved or cleared by the [place] FDA and has been authorized for detection and/or diagnosis of SARS CoV 2 by FDA under an Emergency Use Authorization (EUA).  This EUA will remain in effect (meaning this test can be used) for the duration of the COVID19 d eclaration under Section 564(b)(1) of the Act, 21 U.S.C. section [phone] 3(b)(1), unless the authorization is terminated or revoked sooner. Performed at [facility], [address] [place], [place] [address]   MRSA PCR Screening     Status: Abnormal   Collection Time:  06/05/19  8:17 AM   Specimen: Nasal Mucosa; Nasopharyngeal  Result Value Ref Range Status   MRSA by PCR POSITIVE (A) NEGATIVE Final    Comment:        The GeneXpert MRSA Assay (FDA approved for NASAL specimens only), is one component of a comprehensive MRSA colonization surveillance program. It is not intended to diagnose MRSA infection nor to guide or monitor treatment for MRSA infections. RESULT CALLED TO, READ BACK BY AND VERIFIED WITH: RICHARDSON,K. RN @1753  ON 06.08.2020 BY COHEN,K Performed at [facility], [place] [address] [place], [place] [address]          Radiology Studies: No results found.      Scheduled Meds:  allopurinol  300 mg Oral Daily   aspirin EC  81 mg Oral Daily   atorvastatin  80 mg Oral Daily   carvedilol  6.25 mg Oral BID WC   Chlorhexidine Gluconate Cloth  6 each Topical Q0600   digoxin  125 mcg Oral Daily   enoxaparin (LOVENOX) injection  40 mg Subcutaneous Q24H   gabapentin  600 mg Oral BID   insulin aspart  0-15 Units Subcutaneous TID WC   insulin aspart  0-5 Units Subcutaneous QHS   mupirocin ointment  1 application Nasal BID   predniSONE  30 mg Oral Q breakfast   sacubitril-valsartan  1 tablet Oral BID   sodium chloride flush  3 mL Intravenous Q12H   [START ON 06/09/2019] torsemide  40 mg Oral Daily   Continuous Infusions:  sodium chloride       LOS: 3 days    Time spent: 29 minutes    Akshar Starnes J [place] DO [facility] Pager [phone]  If 7PM-7AM, please contact night-coverage [email or web address] Password [facility] 06/08/2019, 12:39 PM

## 2019-06-08 NOTE — Care Management Important Message (Signed)
Important Message  Patient Details IM Letter given to Dessa Phi RN to present to the Patient  Name: Patrick Stewart MRN: [id number] Date of Birth: Feb 03, 1956   Medicare Important Message Given:  Yes    Kerin Salen 06/08/2019, 12:03 PM

## 2019-06-08 NOTE — Evaluation (Signed)
Physical Therapy Evaluation Patient Details Name: Patrick Stewart MRN: [id number] DOB: 1956/03/04 Today's Date: 06/08/2019   History of Present Illness  63 year old male who presented with lower extremity edema, and worsening dyspnea.  He does have significant past medical history for coronary artery disease, systolic heart failure, chronic kidney disease, COPD, hypertension, dyslipidemia and type 2 diabetes mellitus.  He reported 7 days of worsening extremity edema, increased abdominal girth, dyspnea on exertion, PND and orthopnea.  His symptoms were persistent despite increased dose of oral diuretics at home. Patient was admitted to the hospital working diagnosis of acute exacerbation of heart failure.    Clinical Impression  Patrick Stewart is being evaluated by acute physical therapy for mobility deficits secondary to admission for exacerbation of heart failure. He is currently functioning close to his baseline of independent to mobilize with no assistive device. He is independent with transfer requires supervision for gait due to slight abnormalities related to Rt knee pain. Patient has no history of falling and demonstrated good stability with retro gait this date. He was educated on benefit of mobilizing with nursing staff during his hospital stay and given a goal to walk 3x/day in hallway with supervision. He is safe to mobilize with nursing staff and does not require acute PT interventions at this time. Please re-consult if there is a change in functional status.    Follow Up Recommendations No PT follow up    Equipment Recommendations  None recommended by PT    Recommendations for Other Services       Precautions / Restrictions Restrictions Weight Bearing Restrictions: No      Mobility  Bed Mobility Overal bed mobility: Independent         Transfers Overall transfer level: Independent      General transfer comment: pt independent for sit to stand transfers, no cues for safey  needed  Ambulation/Gait Ambulation/Gait assistance: Supervision Gait Distance (Feet): 200 Feet Assistive device: None     Gait velocity interpretation: 1.31 - 2.62 ft/sec, indicative of limited community ambulator General Gait Details: pt with overal normal gait pattern, slight antalgia as ambulated greater distance due to Rt knee pain      Balance Overall balance assessment: Independent;Needs assistance   Sitting balance-Leahy Scale: Normal     Standing balance support: During functional activity Standing balance-Leahy Scale: Good Standing balance comment: slight limitations noted due to Rt knee pain, recommend supervision for gait with RN staff Single Leg Stance - Right Leg: 3 Single Leg Stance - Left Leg: 3      High level balance activites: Backward walking;Other (comment) High Level Balance Comments: pt demonstrated good stabiltiy with backwards gait and 1 episode of LOB with tandem gait but pt able to recover balance without assistance         Pertinent Vitals/Pain Pain Assessment: 0-10 Pain Score: 5  Pain Location: Rt knee Pain Descriptors / Indicators: Aching Pain Intervention(s): Limited activity within patient's tolerance;Monitored during session(pt reported he requested medication prior to PT arrival)    Home Living Family/patient expects to be discharged to:: Private residence Living Arrangements: Spouse/significant other   Type of Home: House Home Access: Stairs to enter Entrance Stairs-Rails: None Entrance Stairs-Number of Steps: 4 at the front door, 2 at the back deck Home Layout: One level Home Equipment: Environmental consultant - 2 wheels;Cane - single point;Crutches      Prior Function Level of Independence: Independent          Hand Dominance   Dominant Hand: Right  Extremity/Trunk Assessment   Upper Extremity Assessment Upper Extremity Assessment: Overall WFL for tasks assessed    Lower Extremity Assessment Lower Extremity Assessment: Overall  WFL for tasks assessed    Cervical / Trunk Assessment Cervical / Trunk Assessment: Normal  Communication   Communication: No difficulties  Cognition Arousal/Alertness: Awake/alert Behavior During Therapy: WFL for tasks assessed/performed Overall Cognitive Status: Within Functional Limits for tasks assessed                  Assessment/Plan    PT Assessment Patent does not need any further PT services  PT Problem List         PT Treatment Interventions      PT Goals (Current goals can be found in the Care Plan section)  Acute Rehab PT Goals Patient Stated Goal: pt did not state specific goals besides going home, therapist made goal for pt to mobilize in hallway 3x/day wtih RN staff PT Goal Formulation: With patient Time For Goal Achievement: 06/15/19 Potential to Achieve Goals: Good    Frequency  1x evaluation    AM-PAC PT "6 Clicks" Mobility  Outcome Measure Help needed turning from your back to your side while in a flat bed without using bedrails?: None Help needed moving from lying on your back to sitting on the side of a flat bed without using bedrails?: None Help needed moving to and from a bed to a chair (including a wheelchair)?: None Help needed standing up from a chair using your arms (e.g., wheelchair or bedside chair)?: None Help needed to walk in hospital room?: A Little Help needed climbing 3-5 steps with a railing? : A Little 6 Click Score: 22    End of Session Equipment Utilized During Treatment: Gait belt Activity Tolerance: Patient tolerated treatment well Patient left: in bed;with call bell/phone within reach Nurse Communication: Mobility status PT Visit Diagnosis: Unsteadiness on feet (R26.81);Difficulty in walking, not elsewhere classified (R26.2);Other abnormalities of gait and mobility (R26.89)    Time: 5830-9407 PT Time Calculation (min) (ACUTE ONLY): 20 min   Charges:   PT Evaluation $PT Eval Low Complexity: 1 Low          Kipp Brood, PT, DPT, [facility] Physical Therapist with [facility]  06/08/2019 12:59 PM

## 2019-06-09 DIAGNOSIS — I5023 Acute on chronic systolic (congestive) heart failure: Secondary | ICD-10-CM

## 2019-06-09 LAB — BASIC METABOLIC PANEL
Anion gap: 10 (ref 5–15)
BUN: 48 mg/dL — ABNORMAL HIGH (ref 8–23)
CO2: 27 mmol/L (ref 22–32)
Calcium: 8.6 mg/dL — ABNORMAL LOW (ref 8.9–10.3)
Chloride: 93 mmol/L — ABNORMAL LOW (ref 98–111)
Creatinine, Ser: 1.8 mg/dL — ABNORMAL HIGH (ref 0.61–1.24)
GFR calc Af Amer: 45 mL/min — ABNORMAL LOW (ref >60)
GFR calc non Af Amer: 39 mL/min — ABNORMAL LOW (ref >60)
Glucose, Bld: 170 mg/dL — ABNORMAL HIGH (ref 70–99)
Potassium: 4.5 mmol/L (ref 3.5–5.1)
Sodium: 130 mmol/L — ABNORMAL LOW (ref 135–145)

## 2019-06-09 LAB — GLUCOSE, CAPILLARY
Glucose-Capillary: 144 mg/dL — ABNORMAL HIGH (ref 70–99)
Glucose-Capillary: 191 mg/dL — ABNORMAL HIGH (ref 70–99)
Glucose-Capillary: 252 mg/dL — ABNORMAL HIGH (ref 70–99)
Glucose-Capillary: 184 mg/dL — ABNORMAL HIGH (ref 70–99)

## 2019-06-09 LAB — LIPID PANEL
Cholesterol: 137 mg/dL (ref 0–200)
HDL: 24 mg/dL — ABNORMAL LOW (ref >40)
LDL Cholesterol: 100 mg/dL — ABNORMAL HIGH (ref 0–99)
Total CHOL/HDL Ratio: 5.7 RATIO
Triglycerides: 63 mg/dL (ref <150)
VLDL: 13 mg/dL (ref 0–40)

## 2019-06-09 MED ORDER — CARVEDILOL 12.5 MG PO TABS
12.5000 mg | ORAL_TABLET | Freq: Two times a day (BID) | ORAL | Status: DC
  Administered 2019-06-09 – 2019-06-10 (×2): 12.5 mg via ORAL
  Filled 2019-06-09 (×2): qty 1

## 2019-06-09 MED ORDER — PREDNISONE 20 MG PO TABS
20.0000 mg | ORAL_TABLET | Freq: Every day | ORAL | Status: DC
  Administered 2019-06-10: 20 mg via ORAL
  Filled 2019-06-09: qty 1

## 2019-06-09 NOTE — Progress Notes (Signed)
Progress Note  Patient Name: Patrick Stewart Date of Encounter: 06/09/2019  Primary Cardiologist: Loralie Champagne, MD   Subjective   Pt feeling well this morning. Breathing is good. His gout is better after starting steroid and a dose of oxycodone. He said he did not sleep well last night due to the steroid. He has mild lower leg edema that he says is pretty much at his baseline.   Inpatient Medications    Scheduled Meds: . allopurinol  300 mg Oral Daily  . aspirin EC  81 mg Oral Daily  . atorvastatin  80 mg Oral Daily  . carvedilol  6.25 mg Oral BID WC  . Chlorhexidine Gluconate Cloth  6 each Topical Q0600  . digoxin  125 mcg Oral Daily  . enoxaparin (LOVENOX) injection  40 mg Subcutaneous Q24H  . gabapentin  600 mg Oral BID  . insulin aspart  0-15 Units Subcutaneous TID WC  . insulin aspart  0-5 Units Subcutaneous QHS  . mupirocin ointment  1 application Nasal BID  . predniSONE  30 mg Oral Q breakfast  . sacubitril-valsartan  1 tablet Oral BID  . sodium chloride flush  3 mL Intravenous Q12H  . torsemide  40 mg Oral Daily   Continuous Infusions: . sodium chloride     PRN Meds: sodium chloride, acetaminophen, hydrALAZINE, ondansetron (ZOFRAN) IV, oxyCODONE, sodium chloride flush   Vital Signs    Vitals:   06/08/19 1337 06/08/19 2056 06/09/19 0500 06/09/19 0502  BP: 137/77 (!) 152/82  131/77  Pulse: 68 81  69  Resp: 16 16  14   Temp: 98.7 F (37.1 C) 98.6 F (37 C)  (!) 97.5 F (36.4 C)  TempSrc: Oral Oral  Oral  SpO2: 95% 98%  96%  Weight:   79 kg   Height:        Intake/Output Summary (Last 24 hours) at 06/09/2019 0931 Last data filed at 06/09/2019 0800 Gross per 24 hour  Intake 720 ml  Output 1675 ml  Net -955 ml   Last 3 Weights 06/09/2019 06/08/2019 06/07/2019  Weight (lbs) 174 lb 3.2 oz 175 lb 1.6 oz 180 lb 1.6 oz  Weight (kg) 79.017 kg 79.425 kg 81.693 kg      Telemetry    SR with AV and V pacing, occ PVCs at 70- Personally Reviewed  ECG    No new  tracings - Personally Reviewed  Physical Exam   GEN: No acute distress.   Neck: No JVD Cardiac: RRR, no murmurs, rubs, or gallops.  Respiratory: Clear to auscultation bilaterally. GI: Soft, nontender, non-distended  MS: Trace-1+ edema to just above ankles; No deformity. Neuro:  Nonfocal  Psych: Normal affect   Labs    Chemistry Recent Labs  Lab 06/07/19 0712 06/08/19 0817 06/09/19 0708  NA 134* 132* 130*  K 3.9 5.0 4.5  CL 95* 94* 93*  CO2 27 26 27   GLUCOSE 117* 106* 170*  BUN 41* 40* 48*  CREATININE 1.68* 1.83* 1.80*  CALCIUM 8.6* 8.7* 8.6*  GFRNONAA 43* 38* 39*  GFRAA 49* 45* 45*  ANIONGAP 12 12 10      Hematology Recent Labs  Lab 06/04/19 2228 06/05/19 0821 06/06/19 0440  WBC 4.3 3.3* 3.4*  RBC 3.78* 3.96* 4.41  HGB 11.0* 11.8* 12.8*  HCT 35.0* 37.8* 41.4  MCV 92.6 95.5 93.9  MCH 29.1 29.8 29.0  MCHC 31.4 31.2 30.9  RDW 18.6* 19.2* 18.6*  PLT 181 175 211    Cardiac Enzymes Recent Labs  Lab 06/04/19 2229 06/05/19 0821 06/05/19 1333 06/05/19 1856  TROPONINI 0.09* 0.10* 0.09* 0.08*   No results for input(s): TROPIPOC in the last 168 hours.   BNP Recent Labs  Lab 06/04/19 2228  BNP 1,580.2*     DDimer No results for input(s): DDIMER in the last 168 hours.   Radiology    No results found.  Cardiac Studies   Echocardiogram 06/05/2019 IMPRESSIONS 1. The left ventricle has severely reduced systolic function, with an ejection fraction of 20-25%. The cavity size was mildly dilated. There is mildly increased left ventricular wall thickness. Left ventricular diastolic Doppler parameters are  consistent with pseudonormalization. Inferior and inferoseptal akinesis, the other wall segments are hypokinetic. 2. The right ventricle has moderately reduced systolic function. The cavity was normal. There is no increase in right ventricular wall thickness. 3. Left atrial size was moderately dilated. 4. Right atrial size was mildly dilated. 5. Trivial  pericardial effusion is present. 6. There is a pleural effusion present. 7. The aortic valve is tricuspid. Mild calcification of the aortic valve. Aortic valve regurgitation is moderate by color flow Doppler. No stenosis of the aortic valve. 8. The aortic root is normal in size and structure. 9. The inferior vena cava was dilated in size with <50% respiratory variability. PA systolic pressure 60 mmHg. 10. No evidence of mitral valve stenosis. Trivial mitral regurgitation.  [facility] 04/15/2017  Left Main  30% stenosis.  Left Anterior Descending  90% mid LAD stenosis. Patent LIMA-LAD.  Left Circumflex  Moderate high OM1 with luminal irregularities. Small caliber distal LCx with moderate diffuse disease. 70-80% stenosis proximally in moderate OM2. Patent SVG-OM2.  Right Coronary Artery  30% proximal and mid RCA stenoses.   RA 9 RV 53/11 PA 52/17, mean 35 PCWP mean 14 LV 133/30 AO 136/86 Oxygen saturations: PA 60% AO 97% Cardiac Output (Fick) 4.12  Cardiac Index (Fick) 1.94  Echo 06/2018 Study Conclusions - Procedure narrative: Transthoracic echocardiography. Image quality was adequate. The study was technically difficult. - Left ventricle: The cavity size was severely dilated. Wall thickness was increased in a pattern of moderate LVH. Systolic function was moderately to severely reduced. The estimated ejection fraction was in the range of 30% to 35%. Diffuse hypokinesis. Regional wall motion abnormalities cannot be excluded. - Regional wall motion abnormality: Akinesis of the mid inferoseptal and entire inferior myocardium. - Aortic valve: There was moderate regurgitation. - Mitral valve: Calcified annulus. Mildly thickened leaflets . Mobility was mildly restricted. There was mild regurgitation. - Left atrium: The atrium was mildly to moderately dilated. - Right ventricle: The cavity size was severely dilated. Wall thickness was normal. Systolic  function was reduced. - Right atrium: The atrium was moderately dilated. - Tricuspid valve: There was mild regurgitation. - Pulmonic valve: There was moderate regurgitation. - Pulmonary arteries: Systolic pressure was moderately to severely increased. PA peak pressure: 64 mm Hg (S).  Impressions: - Severe LV dysfunction with diffuse hypokinesis and akinesis of the inferior and inferoseptal walls. Biatrial enlargement, RV enlargement with reduced RV function. Elevated right sided filling pressures. Moderate AR, mild MR and TR.  Patient Profile     63 y.o. male with a hx of ICM, CAD s/p CABG X 2 2010, COPD, DMII, gout, former smoker, HTNwho is being seen for themanagementof acute on chronic systolic HF.  Assessment & Plan    1. Acute on chronic combined systolic and diastolic heart failure/Ischemic CM -Echo shows EF 20-25%, mildly dilated LV, mildly increased LV wall thickness, diastolic dysfunction,  Inferior and inferoseptal akinesis, other wall segments are hypokinetic. Also moderately reduced RV systolic function. Prior EF 30-35% in 06/2018, has been down to 10% in the past.  -Pt has BiV CRT-D in place. -Diuresed well with lasix 40 mg IV q 8h. SCr bumped up a little yest. Now switched to torsemide 40 mg daily, starting today. Pt states that he was taking torsemide 20 mg twice a day. We discussed that  He should probably take 40 mg at one time in order to get over the renal threshold given his renal insufficiency. Will see how he responds to the 40 mg today.  Delene Loll was increased to 49-51 mg BID. Should help with diuresis. He continues on carvedilol 6.25 mg, digoxin 125 mcg. Not initiating spironolactone at this time due to renal function. Could consider as outpatient if renal function improves.  -I&O: 1.6L out yesterday. Net negative 10.5L fluid balance since admission. Wt down 1 pound from yesterday, down 21 lbs from peak wt.  -Pretty much back to his baseline.  -Watch  urine output today on oral diuretic and labs in am. It continues good diuresis and renal function stable, possible discharge tomorrow.  -Out patient follow up with HF clinic arranged.   2. Acute on chronic kidney disease stage III -SCr rose to 2.06 > 1.68> 1.83> 1.8 today  -Diuretic switched to oral torsemide at his prior home dose of 40 mg daily.  -Will need follow up labs at hosp follow up visit.   3. CAD s/p CABG X3 2010 -Troponins mildly elevated in flat pattern consistent with demand ischemia in setting of acute heart failure exacerbation.  -No anginal symptoms -continues on aspirin, high intensity statin, beta blocker  4. COPD -Former smoker -Stable  5. Diabetes type 2 -A1c 6.8. Improved control since earlier this year.   6. Hyperlipidemia -On high intensity statin with atorvastatin 80 mg -LDL was 134 in 01/2018. LDL checked today is 100. Target LDL <70.  -Pt admits that he has not been taking his atorvastatin. He thinks that he no longer has an updated prescription. He will need Rx at discharge. Placed on discharge orders page.   7. Presence of CRT-D -Followed by Dr. Curt Bears. Follow up visit for EP arranged.         For questions or updates, please contact [place] Please consult [email or web address] for contact info under        Signed, Daune Perch, NP  06/09/2019, 9:31 AM

## 2019-06-09 NOTE — TOC Initial Note (Signed)
Transition of Care [facility]) - Initial/Assessment Note    Patient Details  Name: Patrick Stewart MRN: [id number] Date of Birth: May 20, 1956  [facility] CM/SW Contact:    Dessa Phi, RN Phone Number: 06/09/2019, 12:08 PM  Clinical Narrative:  Patient has support,has transport to appts,has pharmacy. CHF-informed of CHF protocal resource for HHC-patient pleasantly declines.No further CM needs.                 Expected Discharge Plan: Home/Self Care Barriers to Discharge: Continued Medical Work up   Patient Goals and CMS Choice        Expected Discharge Plan and Services Expected Discharge Plan: Home/Self Care   Discharge Planning Services: CM Consult   Living arrangements for the past 2 months: Single Family Home Expected Discharge Date: (unknown)                                    Prior Living Arrangements/Services Living arrangements for the past 2 months: Single Family Home Lives with:: Spouse Patient language and need for interpreter reviewed:: Yes Do you feel safe going back to the place where you live?: Yes      Need for Family Participation in Patient Care: No (Comment) Care giver support system in place?: Yes (comment)   Criminal Activity/Legal Involvement Pertinent to Current Situation/Hospitalization: No - Comment as needed  Activities of Daily Living Home Assistive Devices/Equipment: Crutches, Nebulizer ADL Screening (condition at time of admission) Patient's cognitive ability adequate to safely complete daily activities?: Yes Is the patient deaf or have difficulty hearing?: No Does the patient have difficulty seeing, even when wearing glasses/contacts?: No Does the patient have difficulty concentrating, remembering, or making decisions?: No Patient able to express need for assistance with ADLs?: Yes Does the patient have difficulty dressing or bathing?: No Independently performs ADLs?: Yes (appropriate for developmental age) Does the  patient have difficulty walking or climbing stairs?: No(at times when neuropathy strikes.) Weakness of Legs: None Weakness of Arms/Hands: None  Permission Sought/Granted   Permission granted to share information with : Yes, Verbal Permission Granted  Share Information with NAME: spouse           Emotional Assessment Appearance:: Appears stated age Attitude/Demeanor/Rapport: Gracious Affect (typically observed): Accepting Orientation: : Oriented to Self, Oriented to Place, Oriented to  Time, Oriented to Situation Alcohol / Substance Use: Never Used Psych Involvement: No (comment)  Admission diagnosis:  Hypervolemia, unspecified hypervolemia type [E87.70] Acute on chronic systolic (congestive) heart failure (HCC) [I50.23] Patient Active Problem List   Diagnosis Date Noted  . Acute exacerbation of CHF (congestive heart failure) ([place]) 06/05/2019  . Uncontrolled type 2 diabetes mellitus ([place]) 06/05/2019  . Acute on chronic combined systolic and diastolic CHF (congestive heart failure) ([place]) 06/05/2019  . Diabetic polyneuropathy associated with type 2 diabetes mellitus ([place]) 02/15/2019  . Noncompliance 10/18/2018  . Acute on chronic diastolic CHF (congestive heart failure) ([place]) 07/16/2018  . Noncompliance by refusing intervention or support 04/06/2018  . Uncontrolled type 2 diabetes mellitus with hyperglycemia ([place]) 01/31/2018  . Hypertensive urgency 07/12/2017  . LBBB (left bundle branch block) 04/26/2017  . AKI (acute kidney injury) ([place])   . COPD with chronic bronchitis ([place]) 08/26/2016  . Kidney stone on right side 10/25/2015  . CKD (chronic kidney disease), stage III ([place]) 10/13/2015  . Gout 10/13/2015  . CAD Status post CABG 2 SVG to OM 1, LIMA to mid LAD: 10/13/2015  .  Cardiomyopathy, ischemic, chronic systolic CHF 57/89/7847  . Essential hypertension 09/04/2014  . Benign prostatic hyperplasia with lower urinary tract symptoms 01/24/2013  . Peyronie's disease 12/18/2012   . Chronic joint pain 06/22/2012  . History of tobacco abuse 01/26/2012  . Inguinal hernia, right 01/26/2012   PCP:  Shelda Pal, DO Pharmacy:   [id number] - [place], [place] - [address]OSS [address] [place] [address] Phone: [phone] Fax: [phone]     Social Determinants of Health (SDOH) Interventions    Readmission Risk Interventions No flowsheet data found.

## 2019-06-09 NOTE — Progress Notes (Signed)
PROGRESS NOTE    Patrick Stewart  ENM:[id number] DOB: 04-04-1956 DOA: 06/04/2019 PCP: Shelda Pal, DO    Brief Narrative:   63 year old male who presented with lower extremity edema, and worsening dyspnea.  He does have significant past medical history for coronary artery disease, systolic heart failure, chronic kidney disease, COPD, hypertension, dyslipidemia and type 2 diabetes mellitus.  He reported 7 days of worsening extremity edema, increased abdominal girth, dyspnea on exertion, PND and orthopnea.  His symptoms were persistent despite increased dose of oral diuretics at home.    On his initial physical examination his temperature was 98.3, heart rate 78, respiratory rate 24, blood pressure 168/103, oxygen saturation 96% on room air.  He had bibasilar Rales, heart S1-S2 present and rhythmic, the abdomen was soft nontender, he had 3+ pitting edema bilaterally.  3 5, potassium 3.8, chloride 99, bicarb 26, glucose 156, BUN 49, creatinine 2.0, BNP 1580, white count 4.3, hemoglobin 9.0, hematocrit 35.0, platelets 181, troponin 0.09.  SARS COVID-19 was negative.  Chest x-ray with significant cardiomegaly, no infiltrates.  EKG 78 bpm, 100% V paced, J-point elevation in V4 to V6, no significant T wave changes.  Patient was admitted to the hospital working diagnosis of acute exacerbation of heart failure.   Assessment & Plan:   Principal Problem:   Acute on chronic diastolic CHF (congestive heart failure) (HCC) Active Problems:   CKD (chronic kidney disease), stage III (HCC)   CAD Status post CABG 2 SVG to OM 1, LIMA to mid LAD:   Essential hypertension   AKI (acute kidney injury) ([place])   Uncontrolled type 2 diabetes mellitus ([place])   Acute on chronic combined systolic and diastolic CHF (congestive heart failure) (HCC)   Acute on chronic systolic congestive heart failure Patient presenting with progressive shortness of breath and lower extremity edema despite increased home oral  diuretics.  BNP elevated to 1580 on admission with chest x-ray with cardiomegaly.  Transthoracic echocardiogram with LVEF 20-25%, mildly dilated cavity and increased left ventricular wall thickness.  Etiology likely secondary to poor dietary habits outpatient and noncompliance with home medications. --Cardiology following, appreciate assistance --Continues with significant volume overload, lower extremity edema, elevated JVP --Net negative 1.9L past 24h, net negative 10.6L since admission --Weight down to 79kg --Continue Coreg 6.25 mg p.o. twice daily, digoxin 125 mcg p.o. daily, Entresto --continue torsemide 40 mg p.o. daily --Continue aspirin and statin --Strict I's and O's and daily weights  Acute renal failure on CKD stage III Creatinine admission 2.0.  The past year ranging from 1.36 to 2.10. Etiology likely secondary to severe volume overload. --Continue IV diuresis as above --Cr 2.06-->1.73-->1.68-->1.83-->1.80 --Converted to oral torsemide 40 mg today --Avoid nephrotoxins, renally dose all medications --Continue to monitor BMP daily  Acute gout flare, left knee Pt reports pain to left knee, feels that this is typical of an acute gout exacerbation.  Uric acid elevated to 13.9.  Pain now improved, resolved. --taper prednisone from 30 mg to 20mg  p.o. daily --Oxycodone PRN for pain  Type 2 diabetes mellitus On glipizide 5 mg p.o. daily outpatient.  A1c 6.8 on 06/05/2019, well controlled. --Hold oral hypoglycemics while inpatient --Consistent carbohydrate diet --Insulin sliding scale for coverage --Continue to monitor CBGs closely especially in the setting of starting oral prednisone for acute gout flare as above.  Dyslipidemia --Continue atorvastatin 80 mg p.o. daily  DVT prophylaxis: Lovenox Code Status: Full code Family Communication: None Disposition Plan: Continue inpatient hospitalization, continue to watch renal function and urine output  now switch to oral diuretic,  possible discharge home tomorrow with home health heart failure services   Consultants:   Cardiology  Procedures:  Transthoracic echocardiogram 06/05/2019: IMPRESSIONS   1. The left ventricle has severely reduced systolic function, with an ejection fraction of 20-25%. The cavity size was mildly dilated. There is mildly increased left ventricular wall thickness. Left ventricular diastolic Doppler parameters are consistent with pseudonormalization. Inferior and inferoseptal akinesis, the other wall segments are hypokinetic.  2. The right ventricle has moderately reduced systolic function. The cavity was normal. There is no increase in right ventricular wall thickness.  3. Left atrial size was moderately dilated.  4. Right atrial size was mildly dilated.  5. Trivial pericardial effusion is present.  6. There is a pleural effusion present.  7. The aortic valve is tricuspid. Mild calcification of the aortic valve. Aortic valve regurgitation is moderate by color flow Doppler. No stenosis of the aortic valve.  8. The aortic root is normal in size and structure.  9. The inferior vena cava was dilated in size with <50% respiratory variability. PA systolic pressure 60 mmHg. 10. No evidence of mitral valve stenosis. Trivial mitral regurgitation.  Antimicrobials:   None   Subjective:  Patient seen and examined at bedside, reports knee pain improved.  Continues with good diuresis.  Now switched to oral diuretics today.  Renal function has stabilized.  No other specific complaints this morning.  Denies headache, no chest pain, no palpitations, no fever/chills/night sweats, no nausea/vomiting/diarrhea, no abdominal pain, no paresthesias.  No acute events overnight per nursing staff.  Objective: Vitals:   06/08/19 1337 06/08/19 2056 06/09/19 0500 06/09/19 0502  BP: 137/77 (!) 152/82  131/77  Pulse: 68 81  69  Resp: 16 16  14   Temp: 98.7 F (37.1 C) 98.6 F (37 C)  (!) 97.5 F (36.4 C)   TempSrc: Oral Oral  Oral  SpO2: 95% 98%  96%  Weight:   79 kg   Height:        Intake/Output Summary (Last 24 hours) at 06/09/2019 1258 Last data filed at 06/09/2019 1207 Gross per 24 hour  Intake 360 ml  Output 1725 ml  Net -1365 ml   Filed Weights   06/07/19 0558 06/08/19 0553 06/09/19 0500  Weight: 81.7 kg 79.4 kg 79 kg    Examination:  General exam: Appears calm and comfortable  Respiratory system: CTAB, no wheezes/crackles/rhonchi, normal respiratory effort, on room air Cardiovascular system: S1 & S2 heard, RRR. No JVD, murmurs, rubs, gallops or clicks.  1+ pitting edema bilaterally lower extremities up to level of mid shin, no JVP Gastrointestinal system: Abdomen is nondistended, soft and nontender. No organomegaly or masses felt. Normal bowel sounds heard. Central nervous system: Alert and oriented. No focal neurological deficits. Extremities: Symmetric 5 x 5 power. Skin: No rashes, lesions or ulcers Psychiatry: Judgement and insight appear normal. Mood & affect appropriate.     Data Reviewed: I have personally reviewed following labs and imaging studies  CBC: Recent Labs  Lab 06/04/19 2228 06/05/19 0821 06/06/19 0440  WBC 4.3 3.3* 3.4*  NEUTROABS 2.7  --   --   HGB 11.0* 11.8* 12.8*  HCT 35.0* 37.8* 41.4  MCV 92.6 95.5 93.9  PLT 181 175 704   Basic Metabolic Panel: Recent Labs  Lab 06/04/19 2228 06/05/19 0821 06/06/19 0440 06/07/19 0712 06/08/19 0817 06/09/19 0708  NA 135  --  135 134* 132* 130*  K 3.8  --  3.7 3.9 5.0 4.5  CL 99  --  97* 95* 94* 93*  CO2 26  --  26 27 26 27   GLUCOSE 156*  --  131* 117* 106* 170*  BUN 49*  --  46* 41* 40* 48*  CREATININE 2.06* 1.73* 1.68* 1.68* 1.83* 1.80*  CALCIUM 8.6*  --  8.7* 8.6* 8.7* 8.6*   GFR: Estimated Creatinine Clearance: 43.4 mL/min (A) (by C-G formula based on SCr of 1.8 mg/dL (H)). Liver Function Tests: No results for input(s): AST, ALT, ALKPHOS, BILITOT, PROT, ALBUMIN in the last 168 hours. No  results for input(s): LIPASE, AMYLASE in the last 168 hours. No results for input(s): AMMONIA in the last 168 hours. Coagulation Profile: No results for input(s): INR, PROTIME in the last 168 hours. Cardiac Enzymes: Recent Labs  Lab 06/04/19 2229 06/05/19 0821 06/05/19 1333 06/05/19 1856  TROPONINI 0.09* 0.10* 0.09* 0.08*   BNP (last 3 results) No results for input(s): PROBNP in the last 8760 hours. HbA1C: No results for input(s): HGBA1C in the last 72 hours. CBG: Recent Labs  Lab 06/08/19 1113 06/08/19 1617 06/08/19 2058 06/09/19 0802 06/09/19 1151  GLUCAP 150* 130* 186* 144* 191*   Lipid Profile: Recent Labs    06/09/19 0708  CHOL 137  HDL 24*  LDLCALC 100*  TRIG 63  CHOLHDL 5.7   Thyroid Function Tests: No results for input(s): TSH, T4TOTAL, FREET4, T3FREE, THYROIDAB in the last 72 hours. Anemia Panel: No results for input(s): VITAMINB12, FOLATE, FERRITIN, TIBC, IRON, RETICCTPCT in the last 72 hours. Sepsis Labs: No results for input(s): PROCALCITON, LATICACIDVEN in the last 168 hours.  Recent Results (from the past 240 hour(s))  SARS Coronavirus 2 (Hosp order,Performed in [facility] lab via [employer] [facility])     Status: None   Collection Time: 06/04/19 10:30 PM   Specimen: Dry Nasal Swab ([employer] [facility] Now)  Result Value Ref Range Status   SARS Coronavirus 2 ([employer] Now) NEGATIVE NEGATIVE Final    Comment: (NOTE) Interpretive Result Comment(s): COVID 19 Positive SARS CoV 2 target nucleic acids are DETECTED. The SARS CoV 2 RNA is generally detectable in upper and lower respiratory specimens during the acute phase of infection.  Positive results are indicative of active infection with SARS CoV 2.  Clinical correlation with patient history and other diagnostic information is necessary to determine patient infection status.  Positive results do not rule out bacterial infection or coinfection with other viruses. The expected result is Negative. COVID 19 Negative  SARS CoV 2 target nucleic acids are NOT DETECTED. The SARS CoV 2 RNA is generally detectable in upper and lower respiratory specimens during the acute phase of infection.  Negative results do not preclude SARS CoV 2 infection, do not rule out coinfections with other pathogens, and should not be used as the sole basis for treatment or other patient management decisions.  Negative results must be combined with clinical  observations, patient history, and epidemiological information. The expected result is Negative. Invalid Presence or absence of SARS CoV 2 nucleic acids cannot be determined. Repeat testing was performed on the submitted specimen and repeated Invalid results were obtained.  If clinically indicated, additional testing on a new specimen with an alternate test methodology [phone]) is advised.  The SARS CoV 2 RNA is generally detectable in upper and lower respiratory specimens during the acute phase of infection. The expected result is Negative. Fact Sheet for Patients:  G[employer]ing[employer].no Fact Sheet for Healthcare Providers: https://www.hernandez-brewer.com/ This test is not yet approved or cleared by the  [place] [employer] and has been authorized for detection and/or diagnosis of SARS CoV 2 by FDA under an Emergency Use Authorization (EUA).  This EUA will remain in effect (meaning this test can be used) for the duration of the COVID19 d eclaration under Section 564(b)(1) of the Act, 21 U.S.C. section [phone] 3(b)(1), unless the authorization is terminated or revoked sooner. Performed at [facility], [address] [place], [place] [address]   MRSA PCR Screening     Status: Abnormal   Collection Time: 06/05/19  8:17 AM   Specimen: Nasal Mucosa; Nasopharyngeal  Result Value Ref Range Status   MRSA by PCR POSITIVE (A) NEGATIVE Final    Comment:        The GeneXpert MRSA Assay (FDA approved for NASAL specimens only), is one  component of a comprehensive MRSA colonization surveillance program. It is not intended to diagnose MRSA infection nor to guide or monitor treatment for MRSA infections. RESULT CALLED TO, READ BACK BY AND VERIFIED WITH: RICHARDSON,K. RN @1753  ON 06.08.2020 BY COHEN,K Performed at [facility], [place] [address], [place], [place] [address]          Radiology Studies: No results found.      Scheduled Meds: . allopurinol  300 mg Oral Daily  . aspirin EC  81 mg Oral Daily  . atorvastatin  80 mg Oral Daily  . carvedilol  12.5 mg Oral BID WC  . Chlorhexidine Gluconate Cloth  6 each Topical Q0600  . digoxin  125 mcg Oral Daily  . enoxaparin (LOVENOX) injection  40 mg Subcutaneous Q24H  . gabapentin  600 mg Oral BID  . insulin aspart  0-15 Units Subcutaneous TID WC  . insulin aspart  0-5 Units Subcutaneous QHS  . mupirocin ointment  1 application Nasal BID  . predniSONE  30 mg Oral Q breakfast  . sacubitril-valsartan  1 tablet Oral BID  . sodium chloride flush  3 mL Intravenous Q12H  . torsemide  40 mg Oral Daily   Continuous Infusions: . sodium chloride       LOS: 4 days    Time spent: 29 minutes    Raun Routh J [place] DO [facility] Pager [phone]  If 7PM-7AM, please contact night-coverage [email or web address] Password [facility] 06/09/2019, 12:58 PM

## 2019-06-09 NOTE — Plan of Care (Signed)
Pt was educated on how to manage cardiac issues and gout flare-ups with diet and exercise.

## 2019-06-10 LAB — BASIC METABOLIC PANEL
Anion gap: 10 (ref 5–15)
BUN: 56 mg/dL — ABNORMAL HIGH (ref 8–23)
CO2: 28 mmol/L (ref 22–32)
Calcium: 8.7 mg/dL — ABNORMAL LOW (ref 8.9–10.3)
Chloride: 93 mmol/L — ABNORMAL LOW (ref 98–111)
Creatinine, Ser: 1.83 mg/dL — ABNORMAL HIGH (ref 0.61–1.24)
GFR calc Af Amer: 45 mL/min — ABNORMAL LOW (ref >60)
GFR calc non Af Amer: 38 mL/min — ABNORMAL LOW (ref >60)
Glucose, Bld: 206 mg/dL — ABNORMAL HIGH (ref 70–99)
Potassium: 4.5 mmol/L (ref 3.5–5.1)
Sodium: 131 mmol/L — ABNORMAL LOW (ref 135–145)

## 2019-06-10 LAB — GLUCOSE, CAPILLARY: Glucose-Capillary: 143 mg/dL — ABNORMAL HIGH (ref 70–99)

## 2019-06-10 LAB — MAGNESIUM: Magnesium: 2.1 mg/dL (ref 1.7–2.4)

## 2019-06-10 MED ORDER — PREDNISONE 5 MG PO TABS: ORAL_TABLET | ORAL | 0 refills | Status: AC

## 2019-06-10 MED ORDER — ENTRESTO 49-51 MG PO TABS: 1.0000 | ORAL_TABLET | Freq: Two times a day (BID) | ORAL | 1 refills | Status: AC

## 2019-06-10 MED ORDER — DIGOXIN 125 MCG PO TABS: 125.0000 ug | ORAL_TABLET | Freq: Every day | ORAL | 1 refills | Status: AC

## 2019-06-10 MED ORDER — CARVEDILOL 25 MG PO TABS: 25.0000 mg | ORAL_TABLET | Freq: Two times a day (BID) | ORAL | 3 refills | Status: DC

## 2019-06-10 MED ORDER — TORSEMIDE 20 MG PO TABS: 40.0000 mg | ORAL_TABLET | Freq: Every day | ORAL | 2 refills | Status: DC

## 2019-06-10 NOTE — Discharge Summary (Signed)
Physician Discharge Summary  Patrick Stewart JKK:[id number] DOB: [birth year] DOA: 06/04/2019  PCP: Shelda Pal, DO  Admit date: 06/04/2019 Discharge date: 06/10/2019  Admitted From: Home Disposition:  Home  Recommendations for Outpatient Follow-up:  1. Follow up with PCP in 1 week 2. Follow-up with heart failure clinic and EP clinic as scheduled 3. Please obtain BMP in one week 4. Encourage patient to weigh himself daily and to maintain a log 5. Encourage adherence with home medication regimen 6. Encourage low-salt diet  Home Health: No Equipment/Devices: None  Discharge Condition: Stable CODE STATUS: Full code Diet recommendation: Heart Healthy / Carb Modified   History of present illness:  63 year old male who presented with lower extremity edema, and worsening dyspnea. He does have significant past medical history for coronary artery disease, systolic heart failure, chronic kidney disease, COPD, hypertension, dyslipidemia and type 2 diabetes mellitus.He reported 7 days of worsening extremity edema, increased abdominal girth, dyspnea on exertion, PND and orthopnea. His symptoms were persistent despite increased dose of oral diuretics at home.   On his initial physical examination his temperature was 98.3, heart rate 78, respiratory rate 24, blood pressure 168/103, oxygen saturation 96% on room air. He had bibasilar Rales, heart S1-S2 present and rhythmic, the abdomen was soft nontender, he had 3+ pitting edema bilaterally. 3 5, potassium 3.8, chloride 99, bicarb 26, glucose 156, BUN 49, creatinine 2.0, BNP 1580, white count 4.3, hemoglobin 9.0, hematocrit 35.0, platelets 181, troponin 0.09.SARS COVID-19 was negative. Chest x-ray with significant cardiomegaly, no infiltrates. EKG 78 bpm, 100% V paced, J-point elevation in V4 to V6, no significant T wave changes.  Patient was admitted to the hospital working diagnosis of acute exacerbation of heart failure.  Hospital  course:  Acute on chronic systolic congestive heart failure Patient presenting with progressive shortness of breath and lower extremity edema despite increased home oral diuretics.  BNP elevated to 1580 on admission with chest x-ray with cardiomegaly.  Transthoracic echocardiogram with LVEF 20-25%, mildly dilated cavity and increased left ventricular wall thickness.  Etiology likely secondary to poor dietary habits outpatient and noncompliance with home medications.  Cardiology was consulted and followed during the hospital course.  Patient was started on aggressive IV diuresis with furosemide with good response with net negative 11.8 L during the hospitalization.  Cardiology recommended to continue his home torsemide 40 mg p.o. daily, but instructed to take full dose at the same time to ensure good renal response.  He will also continue Coreg, digoxin, Entresto, aspirin and statin.  Patient instructed to maintain daily weights and to maintain a log to bring to next PCP and cardiology visits.  Instructed on several occasions to maintain a low-salt diet.  Patient's weight at time of discharge was 78.7 kg / 173.6 pounds.  Patient has follow-up scheduled with the heart failure clinic as well as the EP clinic.  Acute renal failure on CKD stage III Creatinine admission 2.0.  The past year ranging from 1.36 to 2.10. Etiology likely secondary to severe volume overload.  Creatinine peaked during hospitalization at 2.06.  With aggressive IV diuresis, patient's creatinine improved to 1.83 at time of discharge.  Recommend repeat BMP in 1 week following discharge.  Acute gout flare, left knee Pt reports pain to left knee, feels that this is typical of an acute gout exacerbation.  Uric acid elevated to 13.9.  Pain now resolved.  We will continue prednisone taper following discharge.  Resume allopurinol.  Type 2 diabetes mellitus On glipizide 5 mg p.o.  daily outpatient.  A1c 6.8 on 06/05/2019, well controlled.    Dyslipidemia Continue atorvastatin 80 mg p.o. daily  Discharge Diagnoses:  Active Problems:   CKD (chronic kidney disease), stage III (HCC)   CAD Status post CABG 2 SVG to OM 1, LIMA to mid LAD:   Essential hypertension   Uncontrolled type 2 diabetes mellitus [facility])    Discharge Instructions  Discharge Instructions    (HEART FAILURE PATIENTS) Call MD:  Anytime you have any of the following symptoms: 1) 3 pound weight gain in 24 hours or 5 pounds in 1 week 2) shortness of breath, with or without a dry hacking cough 3) swelling in the hands, feet or stomach 4) if you have to sleep on extra pillows at night in order to breathe.   Complete by: As directed    Call MD for:  difficulty breathing, headache or visual disturbances   Complete by: As directed    Call MD for:  extreme fatigue   Complete by: As directed    Call MD for:  persistant dizziness or light-headedness   Complete by: As directed    Call MD for:  persistant nausea and vomiting   Complete by: As directed    Call MD for:  severe uncontrolled pain   Complete by: As directed    Call MD for:  temperature >100.4   Complete by: As directed    Diet - low sodium heart healthy   Complete by: As directed    Diet Carb Modified   Complete by: As directed    Heart Failure patients record your daily weight using the same scale at the same time of day   Complete by: As directed    Increase activity slowly   Complete by: As directed    STOP any activity that causes chest pain, shortness of breath, dizziness, sweating, or exessive weakness   Complete by: As directed      Allergies as of 06/10/2019      Reactions   Hydrocodone Itching   Tramadol Nausea Only      Medication List    STOP taking these medications   spironolactone 25 MG tablet Commonly known as: ALDACTONE     TAKE these medications   albuterol 108 (90 Base) MCG/ACT inhaler Commonly known as: VENTOLIN HFA Inhale 2 puffs into the lungs every 6 (six)  hours as needed for wheezing or shortness of breath. What changed: Another medication with the same name was changed. Make sure you understand how and when to take each.   albuterol (2.5 MG/3ML) 0.083% nebulizer solution Commonly known as: PROVENTIL INHALE CONTENTS OF 1 VIAL VIA NEBULIZER EVERY 6 HOURS AS NEEDED FOR WHEEZING/SHORTNESS OF BREATH What changed:   how much to take  how to take this  when to take this  reasons to take this  additional instructions   allopurinol 300 MG tablet Commonly known as: ZYLOPRIM Take 1 tablet (300 mg total) by mouth 2 (two) times daily.   aspirin 81 MG EC tablet Take 1 tablet (81 mg total) by mouth daily.   atorvastatin 80 MG tablet Commonly known as: LIPITOR Take 1 tablet (80 mg total) by mouth daily.   carvedilol 25 MG tablet Commonly known as: COREG Take 1 tablet (25 mg total) by mouth 2 (two) times daily with a meal.   celecoxib 100 MG capsule Commonly known as: CELEBREX TAKE 1 CAPSULE BY MOUTH TWICE A DAY   Colcrys 0.6 MG tablet Generic drug: colchicine TAKE 1  TABLET BY MOUTH TWICE A DAY What changed:   how much to take  when to take this  reasons to take this   digoxin 0.125 MG tablet Commonly known as: LANOXIN Take 1 tablet (125 mcg total) by mouth daily.   DULoxetine 20 MG capsule Commonly known as: CYMBALTA TAKE 1 CAPSULE BY MOUTH EVERY DAY What changed: how much to take   Entresto 49-51 MG Generic drug: sacubitril-valsartan Take 1 tablet by mouth 2 (two) times daily.   freestyle lancets Use to check sugars daily   gabapentin 600 MG tablet Commonly known as: NEURONTIN Take 1 tablet (600 mg total) by mouth 2 (two) times daily.   glipiZIDE 5 MG tablet Commonly known as: GLUCOTROL Take 1 tablet (5 mg total) by mouth daily before breakfast.   glucose blood test strip Use as instructed   Investigational - Study Medication Take 1 tablet by mouth 2 (two) times daily. Study name: Galactic HF  Study Additional study details: Omecamtiv Mecarbil or Placebo What changed: additional instructions   Lidocaine 1.8 % Ptch Apply 1 patch topically daily as needed (pain).   predniSONE 5 MG tablet Commonly known as: DELTASONE Take 4 tablets (20 mg total) by mouth daily with breakfast for 2 days, THEN 2 tablets (10 mg total) daily with breakfast for 2 days, THEN 1 tablet (5 mg total) daily with breakfast for 3 days. Start taking on: June 11, 2019   torsemide 20 MG tablet Commonly known as: DEMADEX Take 2 tablets (40 mg total) by mouth daily.            Durable Medical Equipment  (From admission, onward)         Start     Ordered   06/09/19 1255  Heart failure home health orders  (Heart failure home health orders / Face to face)  Once    Comments: Heart Failure Follow-up Care:  Verify follow-up appointments per Patient Discharge Instructions. Confirm transportation arranged. Reconcile home medications with discharge medication list. Remove discontinued medications from use. Assist patient/caregiver to manage medications using pill box. Reinforce low sodium food selection Assessments: [facility] Vital signs and oxygen saturation at each visit. Assess home environment for safety concerns, caregiver support and availability of low-sodium foods. Consult Education officer, museum, PT/OT, Dietitian, and CNA based on assessments. Perform comprehensive cardiopulmonary assessment. Notify MD for any change in condition or weight gain of 3 pounds in one day or 5 pounds in one week with symptoms. Daily Weights and Symptom Monitoring: Ensure patient has access to scales. Teach patient/caregiver to weigh daily before breakfast and after voiding using same scale and record.    Teach patient/caregiver to track weight and symptoms and when to notify Provider. Activity: Develop individualized activity plan with patient/caregiver.  Question Answer Comment  Heart Failure Follow-up Care Or per Doctor (see  comments)   Home Health Visits Set up telemonitoring equipment to monitor daily vital signs, weights and oxygen saturation   Obtain the following labs Basic Metabolic Panel   Lab frequency Weekly   Fax lab results to Other see comments   Diet Low Sodium Heart Healthy   Fluid restrictions: 1800 mL Fluid   Skilled Nurse to notify MD of weight trends weekly for first 2 weeks. May fax or call: (call) OR fax to: Other see comments   Consult: Case manager   Consult: Social work      06/09/19 [facility] Follow up.   Specialty: Cardiology Why: Hospital follow up in heart failure office on Thursday 06/15/19 at 8:30. The parking code for June is [id number].  Contact information: [address] [phone] mc [place]       Constance Haw, MD Follow up.   Specialty: Cardiology Why: Pacemaker follow up on 07/04/19 at 2:45. Please arrive 15 minutes early for check in.  Contact information: [address] [place] [address] [place], [place], DO. Call in 1 week(s).   Specialty: Family Medicine Contact information: [place] [address] [phone]        Larey Dresser, MD .   Specialty: Cardiology Contact information: [place] [address] [phone]          Allergies  Allergen Reactions  . Hydrocodone Itching  . Tramadol Nausea Only    Consultations:  Cardiology   Procedures/Studies: Dg Chest Port 1 View  Result Date: 06/04/2019 CLINICAL DATA:  63 year old male with history of bilateral lower extremity swelling. EXAM: PORTABLE CHEST 1 VIEW COMPARISON:  Chest x-ray 01/22/2019. FINDINGS: Lung volumes are normal. No consolidative airspace disease. No pleural effusions. Mild cephalization of the pulmonary vasculature, without frank pulmonary edema.  Moderate cardiomegaly. Upper mediastinal contours are within normal limits. Aortic atherosclerosis. Status post median sternotomy for CABG. Left-sided biventricular pacemaker/AICD with lead tips projecting over the expected location of the right atrium, right ventricle and left ventricle via the coronary sinus and coronary veins. IMPRESSION: 1. Cardiomegaly with pulmonary venous congestion, but no frank pulmonary edema. 2. Aortic atherosclerosis. Electronically Signed   By: Vinnie Langton M.D.   On: 06/04/2019 23:13     Transthoracic echocardiogram 06/05/2019: IMPRESSIONS  1. The left ventricle has severely reduced systolic function, with an ejection fraction of 20-25%. The cavity size was mildly dilated. There is mildly increased left ventricular wall thickness. Left ventricular diastolic Doppler parameters are consistent with pseudonormalization. Inferior and inferoseptal akinesis, the other wall segments are hypokinetic. 2. The right ventricle has moderately reduced systolic function. The cavity was normal. There is no increase in right ventricular wall thickness. 3. Left atrial size was moderately dilated. 4. Right atrial size was mildly dilated. 5. Trivial pericardial effusion is present. 6. There is a pleural effusion present. 7. The aortic valve is tricuspid. Mild calcification of the aortic valve. Aortic valve regurgitation is moderate by color flow Doppler. No stenosis of the aortic valve. 8. The aortic root is normal in size and structure. 9. The inferior vena cava was dilated in size with <50% respiratory variability. PA systolic pressure 60 mmHg. 10. No evidence of mitral valve stenosis. Trivial mitral regurgitation.   Subjective: Examined at bedside, sitting up in bed.  Awaiting breakfast.  States ready for discharge home.  Feels great "ready to run a marathon".  No other complaints or concerns at this time.  Denies headache, no fever/chills/night sweats, no nausea  vomiting/diarrhea, no chest pain, no palpitations, no shortness of breath, no abdominal pain, no weakness, no issues with bowel/bladder function, no paresthesias.  No acute events overnight per nursing staff.   Discharge Exam: Vitals:   06/09/19 2045 06/10/19 0540  BP: 127/78 (!) 129/91  Pulse: 60 61  Resp: 16 16  Temp: 98.2 F (36.8 C) 97.8 F (36.6 C)  SpO2: 92% 99%   Vitals:   06/09/19 0502 06/09/19 1431 06/09/19 2045  06/10/19 0540  BP: 131/77 130/76 127/78 (!) 129/91  Pulse: 69 64 60 61  Resp: 14 16 16 16   Temp: (!) 97.5 F (36.4 C) 98 F (36.7 C) 98.2 F (36.8 C) 97.8 F (36.6 C)  TempSrc: Oral Oral Oral Oral  SpO2: 96% 96% 92% 99%  Weight:    78.7 kg  Height:        General: Pt is alert, awake, not in acute distress Cardiovascular: RRR, S1/S2 +, no rubs, no gallops, trace to 1+ pitting edema bilaterally with wrinkling of skin Respiratory: CTA bilaterally, no wheezing, no rhonchi Abdominal: Soft, NT, ND, bowel sounds + Extremities: no cyanosis    The results of significant diagnostics from this hospitalization (including imaging, microbiology, ancillary and laboratory) are listed below for reference.     Microbiology: Recent Results (from the past 240 hour(s))  SARS Coronavirus 2 (Hosp order,Performed in [facility] lab via Abbott ID)     Status: None   Collection Time: 06/04/19 10:30 PM   Specimen: Dry Nasal Swab (Abbott ID Now)  Result Value Ref Range Status   SARS Coronavirus 2 (Abbott ID Now) NEGATIVE NEGATIVE Final    Comment: (NOTE) Interpretive Result Comment(s): COVID 19 Positive SARS CoV 2 target nucleic acids are DETECTED. The SARS CoV 2 RNA is generally detectable in upper and lower respiratory specimens during the acute phase of infection.  Positive results are indicative of active infection with SARS CoV 2.  Clinical correlation with patient history and other diagnostic information is necessary to determine patient infection status.  Positive  results do not rule out bacterial infection or coinfection with other viruses. The expected result is Negative. COVID 19 Negative SARS CoV 2 target nucleic acids are NOT DETECTED. The SARS CoV 2 RNA is generally detectable in upper and lower respiratory specimens during the acute phase of infection.  Negative results do not preclude SARS CoV 2 infection, do not rule out coinfections with other pathogens, and should not be used as the sole basis for treatment or other patient management decisions.  Negative results must be combined with clinical  observations, patient history, and epidemiological information. The expected result is Negative. Invalid Presence or absence of SARS CoV 2 nucleic acids cannot be determined. Repeat testing was performed on the submitted specimen and repeated Invalid results were obtained.  If clinically indicated, additional testing on a new specimen with an alternate test methodology [phone]) is advised.  The SARS CoV 2 RNA is generally detectable in upper and lower respiratory specimens during the acute phase of infection. The expected result is Negative. Fact Sheet for Patients:  GolfingFamily.no Fact Sheet for Healthcare Providers: https://www.hernandez-brewer.com/ This test is not yet approved or cleared by the [place] FDA and has been authorized for detection and/or diagnosis of SARS CoV 2 by FDA under an Emergency Use Authorization (EUA).  This EUA will remain in effect (meaning this test can be used) for the duration of the COVID19 d eclaration under Section 564(b)(1) of the Act, 21 U.S.C. section [phone] 3(b)(1), unless the authorization is terminated or revoked sooner. Performed at [facility], [address] [place], [place] [address]   MRSA PCR Screening     Status: Abnormal   Collection Time: 06/05/19  8:17 AM   Specimen: Nasal Mucosa; Nasopharyngeal  Result Value Ref Range Status    MRSA by PCR POSITIVE (A) NEGATIVE Final    Comment:        The GeneXpert MRSA Assay (FDA approved  for NASAL specimens only), is one component of a comprehensive MRSA colonization surveillance program. It is not intended to diagnose MRSA infection nor to guide or monitor treatment for MRSA infections. RESULT CALLED TO, READ BACK BY AND VERIFIED WITH: RICHARDSON,K. RN @1753  ON 06.08.2020 BY COHEN,K Performed at [facility], [place] [address] [place], [place] [address]      Labs: BNP (last 3 results) Recent Labs    07/15/18 2209 06/04/19 2228  BNP 681.1* 4,782.9*   Basic Metabolic Panel: Recent Labs  Lab 06/06/19 0440 06/07/19 0712 06/08/19 0817 06/09/19 0708 06/10/19 0511  NA 135 134* 132* 130* 131*  K 3.7 3.9 5.0 4.5 4.5  CL 97* 95* 94* 93* 93*  CO2 26 27 26 27 28   GLUCOSE 131* 117* 106* 170* 206*  BUN 46* 41* 40* 48* 56*  CREATININE 1.68* 1.68* 1.83* 1.80* 1.83*  CALCIUM 8.7* 8.6* 8.7* 8.6* 8.7*  MG  --   --   --   --  2.1   Liver Function Tests: No results for input(s): AST, ALT, ALKPHOS, BILITOT, PROT, ALBUMIN in the last 168 hours. No results for input(s): LIPASE, AMYLASE in the last 168 hours. No results for input(s): AMMONIA in the last 168 hours. CBC: Recent Labs  Lab 06/04/19 2228 06/05/19 0821 06/06/19 0440  WBC 4.3 3.3* 3.4*  NEUTROABS 2.7  --   --   HGB 11.0* 11.8* 12.8*  HCT 35.0* 37.8* 41.4  MCV 92.6 95.5 93.9  PLT 181 175 211   Cardiac Enzymes: Recent Labs  Lab 06/04/19 2229 06/05/19 0821 06/05/19 1333 06/05/19 1856  TROPONINI 0.09* 0.10* 0.09* 0.08*   BNP: Invalid input(s): POCBNP CBG: Recent Labs  Lab 06/09/19 0802 06/09/19 1151 06/09/19 1650 06/09/19 2048 06/10/19 0756  GLUCAP 144* 191* 252* 184* 143*   D-Dimer No results for input(s): DDIMER in the last 72 hours. Hgb A1c No results for input(s): HGBA1C in the last 72 hours. Lipid Profile Recent Labs    06/09/19 0708  CHOL 137  HDL 24*   LDLCALC 100*  TRIG 63  CHOLHDL 5.7   Thyroid function studies No results for input(s): TSH, T4TOTAL, T3FREE, THYROIDAB in the last 72 hours.  Invalid input(s): FREET3 Anemia work up No results for input(s): VITAMINB12, FOLATE, FERRITIN, TIBC, IRON, RETICCTPCT in the last 72 hours. Urinalysis    Component Value Date/Time   COLORURINE YELLOW 02/19/2018 1301   APPEARANCEUR CLEAR 02/19/2018 1301   LABSPEC 1.025 02/19/2018 1301   PHURINE 6.0 02/19/2018 1301   GLUCOSEU >=500 (A) 02/19/2018 1301   HGBUR TRACE (A) 02/19/2018 1301   BILIRUBINUR NEGATIVE 02/19/2018 1301   KETONESUR NEGATIVE 02/19/2018 1301   PROTEINUR 100 (A) 02/19/2018 1301   UROBILINOGEN 1.0 10/12/2015 1830   NITRITE NEGATIVE 02/19/2018 1301   LEUKOCYTESUR NEGATIVE 02/19/2018 1301   Sepsis Labs Invalid input(s): PROCALCITONIN,  WBC,  LACTICIDVEN Microbiology Recent Results (from the past 240 hour(s))  SARS Coronavirus 2 (Hosp order,Performed in [place] [facility] lab via Abbott ID)     Status: None   Collection Time: 06/04/19 10:30 PM   Specimen: Dry Nasal Swab (Abbott ID Now)  Result Value Ref Range Status   SARS Coronavirus 2 (Abbott ID Now) NEGATIVE NEGATIVE Final    Comment: (NOTE) Interpretive Result Comment(s): COVID 19 Positive SARS CoV 2 target nucleic acids are DETECTED. The SARS CoV 2 RNA is generally detectable in upper and lower respiratory specimens during the acute phase of infection.  Positive results are indicative of active infection with  SARS CoV 2.  Clinical correlation with patient history and other diagnostic information is necessary to determine patient infection status.  Positive results do not rule out bacterial infection or coinfection with other viruses. The expected result is Negative. COVID 19 Negative SARS CoV 2 target nucleic acids are NOT DETECTED. The SARS CoV 2 RNA is generally detectable in upper and lower respiratory specimens during the acute phase of infection.   Negative results do not preclude SARS CoV 2 infection, do not rule out coinfections with other pathogens, and should not be used as the sole basis for treatment or other patient management decisions.  Negative results must be combined with clinical  observations, patient history, and epidemiological information. The expected result is Negative. Invalid Presence or absence of SARS CoV 2 nucleic acids cannot be determined. Repeat testing was performed on the submitted specimen and repeated Invalid results were obtained.  If clinically indicated, additional testing on a new specimen with an alternate test methodology [phone]) is advised.  The SARS CoV 2 RNA is generally detectable in upper and lower respiratory specimens during the acute phase of infection. The expected result is Negative. Fact Sheet for Patients:  GolfingFamily.no Fact Sheet for Healthcare Providers: https://www.hernandez-brewer.com/ This test is not yet approved or cleared by the [place] FDA and has been authorized for detection and/or diagnosis of SARS CoV 2 by FDA under an Emergency Use Authorization (EUA).  This EUA will remain in effect (meaning this test can be used) for the duration of the COVID19 d eclaration under Section 564(b)(1) of the Act, 21 U.S.C. section [phone] 3(b)(1), unless the authorization is terminated or revoked sooner. Performed at [facility], [address] [place], [place] [address]   MRSA PCR Screening     Status: Abnormal   Collection Time: 06/05/19  8:17 AM   Specimen: Nasal Mucosa; Nasopharyngeal  Result Value Ref Range Status   MRSA by PCR POSITIVE (A) NEGATIVE Final    Comment:        The GeneXpert MRSA Assay (FDA approved for NASAL specimens only), is one component of a comprehensive MRSA colonization surveillance program. It is not intended to diagnose MRSA infection nor to guide or monitor treatment for MRSA  infections. RESULT CALLED TO, READ BACK BY AND VERIFIED WITH: RICHARDSON,K. RN @1753  ON 06.08.2020 BY COHEN,K Performed at [facility], [place] [address], [place], [place] [address]      Time coordinating discharge: Over 30 minutes  SIGNED:   Eric J [place] DO  [facility] 06/10/2019, 9:50 AM

## 2019-06-10 NOTE — Plan of Care (Signed)
Pt and I discussed eating healthier. (More fruits and vegetables)

## 2019-06-10 NOTE — Progress Notes (Signed)
Progress Note  Patient Name: Patrick Stewart Date of Encounter: 06/10/2019  Primary Cardiologist: Loralie Champagne, MD   Subjective   No complaints. No SOB this AM  Inpatient Medications    Scheduled Meds: . allopurinol  300 mg Oral Daily  . aspirin EC  81 mg Oral Daily  . atorvastatin  80 mg Oral Daily  . carvedilol  12.5 mg Oral BID WC  . Chlorhexidine Gluconate Cloth  6 each Topical Q0600  . digoxin  125 mcg Oral Daily  . enoxaparin (LOVENOX) injection  40 mg Subcutaneous Q24H  . gabapentin  600 mg Oral BID  . insulin aspart  0-15 Units Subcutaneous TID WC  . insulin aspart  0-5 Units Subcutaneous QHS  . mupirocin ointment  1 application Nasal BID  . predniSONE  20 mg Oral Q breakfast  . sacubitril-valsartan  1 tablet Oral BID  . sodium chloride flush  3 mL Intravenous Q12H  . torsemide  40 mg Oral Daily   Continuous Infusions: . sodium chloride     PRN Meds: sodium chloride, acetaminophen, hydrALAZINE, ondansetron (ZOFRAN) IV, oxyCODONE, sodium chloride flush   Vital Signs    Vitals:   06/09/19 0502 06/09/19 1431 06/09/19 2045 06/10/19 0540  BP: 131/77 130/76 127/78 (!) 129/91  Pulse: 69 64 60 61  Resp: 14 16 16 16   Temp: (!) 97.5 F (36.4 C) 98 F (36.7 C) 98.2 F (36.8 C) 97.8 F (36.6 C)  TempSrc: Oral Oral Oral Oral  SpO2: 96% 96% 92% 99%  Weight:    78.7 kg  Height:        Intake/Output Summary (Last 24 hours) at 06/10/2019 0818 Last data filed at 06/10/2019 0543 Gross per 24 hour  Intake 480 ml  Output 2050 ml  Net -1570 ml   Last 3 Weights 06/10/2019 06/09/2019 06/08/2019  Weight (lbs) 173 lb 9.6 oz 174 lb 3.2 oz 175 lb 1.6 oz  Weight (kg) 78.744 kg 79.017 kg 79.425 kg      Telemetry    N/a, set for discharge data purged - Personally Reviewed  ECG    na - Personally Reviewed  Physical Exam   GEN: No acute distress.   Neck: No JVD Cardiac: RRR, no murmurs, rubs, or gallops.  Respiratory: Clear to auscultation bilaterally. GI: Soft,  nontender, non-distended  MS: No edema; No deformity. Neuro:  Nonfocal  Psych: Normal affect   Labs    Chemistry Recent Labs  Lab 06/08/19 0817 06/09/19 0708 06/10/19 0511  NA 132* 130* 131*  K 5.0 4.5 4.5  CL 94* 93* 93*  CO2 26 27 28   GLUCOSE 106* 170* 206*  BUN 40* 48* 56*  CREATININE 1.83* 1.80* 1.83*  CALCIUM 8.7* 8.6* 8.7*  GFRNONAA 38* 39* 38*  GFRAA 45* 45* 45*  ANIONGAP 12 10 10      Hematology Recent Labs  Lab 06/04/19 2228 06/05/19 0821 06/06/19 0440  WBC 4.3 3.3* 3.4*  RBC 3.78* 3.96* 4.41  HGB 11.0* 11.8* 12.8*  HCT 35.0* 37.8* 41.4  MCV 92.6 95.5 93.9  MCH 29.1 29.8 29.0  MCHC 31.4 31.2 30.9  RDW 18.6* 19.2* 18.6*  PLT 181 175 211    Cardiac Enzymes Recent Labs  Lab 06/04/19 2229 06/05/19 0821 06/05/19 1333 06/05/19 1856  TROPONINI 0.09* 0.10* 0.09* 0.08*   No results for input(s): TROPIPOC in the last 168 hours.   BNP Recent Labs  Lab 06/04/19 2228  BNP 1,580.2*     DDimer No results for input(s): DDIMER  in the last 168 hours.   Radiology    No results found.  Cardiac Studies    Patient Profile     63 y.o. male with a hx of ICM, CAD s/p CABG X 2 2010, COPD, DMII, gout, former smoker, HTNwho is being seen for themanagementof acute on chronic systolic HF.  Assessment & Plan    1. Acute on chronic combined systolic/diastolic HF/ICM - 03/9610 echo LVEF 20-25%, grade II diastolic dysfunction, inferior akinesis, moderate RV dysfunction. 06/2018 echo LVEF 30-35% - had BiV CRT-D - changed to oral torsemide 40mg  daily, had been on IV lasix, had uptrend in Cr  - negative 1.2 L yesterday, negative 11.8 L since admission. Cr overall stable - other medical therapy with coreg 12.5mg  bid, digoxin 0.125, entresto 49/51mg  bid. No aldactone at this time due to renal function - continue current meds     2. AKI on CKD - Cr within his prior range dating back to 02/2019. Peak of 2.06 this admit, down to 1.8 today.   3. Aortic  regurgitation - moderate by echo  4. CAD s/p CABG X3 2010 -Troponins mildly elevated in flat pattern consistent with demand ischemia in setting of acute heart failure exacerbation.  -No anginal symptoms  5. Hyperlipidemia -On high intensity statin with atorvastatin 80 mg -LDL was 134 in 01/2018. LDL checked today is 100. Target LDL <70. -Pt admits that he has not been taking his atorvastatin. He thinks that he no longer has an updated prescription. He will need Rx at discharge. Placed on discharge orders page.    - ok for discharge from cardiac standpoint. Already has CHF clinic f/u    For questions or updates, please contact [place] Please consult [email or web address] for contact info under        Signed, Carlyle Dolly, MD  06/10/2019, 8:18 AM

## 2019-06-12 ENCOUNTER — Emergency Department (HOSPITAL_BASED_OUTPATIENT_CLINIC_OR_DEPARTMENT_OTHER)
Admission: EM | Admit: 2019-06-12 | Discharge: 2019-06-12 | Disposition: A | Discharge status: Home / Self Care | Payer: Medicare Other | Attending: Emergency Medicine | Admitting: Emergency Medicine

## 2019-06-12 ENCOUNTER — Ambulatory Visit: Payer: Self-pay | Admitting: Family Medicine

## 2019-06-12 ENCOUNTER — Emergency Department (HOSPITAL_BASED_OUTPATIENT_CLINIC_OR_DEPARTMENT_OTHER): Payer: Medicare Other

## 2019-06-12 ENCOUNTER — Encounter (HOSPITAL_BASED_OUTPATIENT_CLINIC_OR_DEPARTMENT_OTHER): Payer: Self-pay | Admitting: Emergency Medicine

## 2019-06-12 ENCOUNTER — Other Ambulatory Visit: Payer: Self-pay

## 2019-06-12 DIAGNOSIS — M25552 Pain in left hip: Secondary | ICD-10-CM | POA: Diagnosis not present

## 2019-06-12 DIAGNOSIS — M1711 Unilateral primary osteoarthritis, right knee: Secondary | ICD-10-CM | POA: Diagnosis not present

## 2019-06-12 DIAGNOSIS — S8391XA Sprain of unspecified site of right knee, initial encounter: Secondary | ICD-10-CM | POA: Diagnosis not present

## 2019-06-12 DIAGNOSIS — W19XXXA Unspecified fall, initial encounter: Secondary | ICD-10-CM | POA: Diagnosis not present

## 2019-06-12 DIAGNOSIS — Y998 Other external cause status: Secondary | ICD-10-CM | POA: Diagnosis not present

## 2019-06-12 DIAGNOSIS — N183 Chronic kidney disease, stage 3 (moderate): Secondary | ICD-10-CM | POA: Diagnosis not present

## 2019-06-12 DIAGNOSIS — M25461 Effusion, right knee: Secondary | ICD-10-CM | POA: Insufficient documentation

## 2019-06-12 DIAGNOSIS — I252 Old myocardial infarction: Secondary | ICD-10-CM | POA: Insufficient documentation

## 2019-06-12 DIAGNOSIS — J449 Chronic obstructive pulmonary disease, unspecified: Secondary | ICD-10-CM | POA: Insufficient documentation

## 2019-06-12 DIAGNOSIS — Z79899 Other long term (current) drug therapy: Secondary | ICD-10-CM | POA: Diagnosis not present

## 2019-06-12 DIAGNOSIS — Y9389 Activity, other specified: Secondary | ICD-10-CM | POA: Diagnosis not present

## 2019-06-12 DIAGNOSIS — S8991XA Unspecified injury of right lower leg, initial encounter: Secondary | ICD-10-CM | POA: Diagnosis present

## 2019-06-12 DIAGNOSIS — I509 Heart failure, unspecified: Secondary | ICD-10-CM | POA: Insufficient documentation

## 2019-06-12 DIAGNOSIS — I130 Hypertensive heart and chronic kidney disease with heart failure and stage 1 through stage 4 chronic kidney disease, or unspecified chronic kidney disease: Secondary | ICD-10-CM | POA: Insufficient documentation

## 2019-06-12 DIAGNOSIS — Z7984 Long term (current) use of oral hypoglycemic drugs: Secondary | ICD-10-CM | POA: Diagnosis not present

## 2019-06-12 DIAGNOSIS — E1122 Type 2 diabetes mellitus with diabetic chronic kidney disease: Secondary | ICD-10-CM | POA: Insufficient documentation

## 2019-06-12 DIAGNOSIS — Z951 Presence of aortocoronary bypass graft: Secondary | ICD-10-CM | POA: Insufficient documentation

## 2019-06-12 DIAGNOSIS — E1140 Type 2 diabetes mellitus with diabetic neuropathy, unspecified: Secondary | ICD-10-CM | POA: Diagnosis not present

## 2019-06-12 DIAGNOSIS — Y92012 Bathroom of single-family (private) house as the place of occurrence of the external cause: Secondary | ICD-10-CM | POA: Diagnosis not present

## 2019-06-12 DIAGNOSIS — I2510 Atherosclerotic heart disease of native coronary artery without angina pectoris: Secondary | ICD-10-CM | POA: Diagnosis not present

## 2019-06-12 DIAGNOSIS — Z87891 Personal history of nicotine dependence: Secondary | ICD-10-CM | POA: Diagnosis not present

## 2019-06-12 MED ORDER — HYDROCODONE-ACETAMINOPHEN 5-325 MG PO TABS
1.0000 | ORAL_TABLET | Freq: Once | ORAL | Status: AC
  Administered 2019-06-12: 1 via ORAL
  Filled 2019-06-12: qty 1

## 2019-06-12 MED ORDER — OXYCODONE-ACETAMINOPHEN 5-325 MG PO TABS: 1.0000 | ORAL_TABLET | Freq: Four times a day (QID) | ORAL | 0 refills | Status: DC | PRN

## 2019-06-12 NOTE — ED Notes (Signed)
ED Provider at bedside. 

## 2019-06-12 NOTE — ED Triage Notes (Signed)
Pt sts he "blacked out" and fell in the bathroom and is having right knee pain and left leg pain.  Sts he hit his head on the commode and had a nose bleed.  Not bleeding at this time.

## 2019-06-12 NOTE — ED Notes (Signed)
Pt stated to staff that he was being driven by a friend.  When nurse took him out after discharge he said he had driven himself.  Brought pt back in.  Pt said he did not have anyone to come get him.  Offered pt a cab voucher but he refused, saying that he did not want to leave his car here.  Pt placed in front of the TV and instructed that he could not leave until after 8pm.  Pt agreed to stay.

## 2019-06-12 NOTE — ED Provider Notes (Signed)
[facility] EMERGENCY DEPARTMENT Provider Note   CSN: [id number] Arrival date & time: 06/12/19  1613     History   Chief Complaint Chief Complaint  Patient presents with  . Knee Pain    HPI Patrick Schumpert is a 63 y.o. male.     HPI Reports he just got discharged from the hospital day before yesterday.  Review of EMR indicates patient was admitted for acute on chronic CHF and diuresed.  He also was diagnosed with an acute gout flare in his left knee.  He reports he was very swollen when he went into the hospital and a lot of the swelling is gone down.  He reports he felt kind of weak after he left the hospital.  He was trying to go to the bathroom last night and when getting up from his toilet got weak in the legs and went down on his right knee.  He reports that he broke his fall with the knee and only lightly struck his head on the adjacent wall.  He did not have any loss of consciousness.  He does not have a headache.  No vision changes.  Reports the main problem is he is got a swollen right knee now that is very painful and some pain but not severe in his left upper leg just below the hip.  He has been walking with some crutches he had at home.  He had put a compression brace on the right knee. Past Medical History:  Diagnosis Date  . CAD (coronary artery disease) 2010   3v CABG  . CHF (congestive heart failure) ([place]) 01/26/2012  . CKD (chronic kidney disease) 02/19/2012  . COPD with chronic bronchitis ([place]) 08/26/2016  . Diabetes mellitus type 2, uncontrolled ([place]) 01/26/2012  . Elevated liver function tests 01/26/2012  . Gout 01/26/2012  . Hx of CABG 2010   RCA Stent 2008, CABG x 2 [facility] 2010  . Hypercholesterolemia 07/08/2016  . Hypertension 09/04/2014  . Ischemic cardiomyopathy 11/15/2014  . Kidney stone 10/25/2015  . Lipid disorder 01/26/2012  . Noncompliance 10/18/2018  . NSTEMI (non-ST elevated myocardial infarction) ([place]) 07/08/2016  . ST elevation  myocardial infarction (STEMI) ([place]) 2010  . Unstable angina ([place]) 11/14/2014    Patient Active Problem List   Diagnosis Date Noted  . Acute exacerbation of CHF (congestive heart failure) ([place]) 06/05/2019  . Uncontrolled type 2 diabetes mellitus ([place]) 06/05/2019  . Diabetic polyneuropathy associated with type 2 diabetes mellitus ([place]) 02/15/2019  . Noncompliance 10/18/2018  . Noncompliance by refusing intervention or support 04/06/2018  . Uncontrolled type 2 diabetes mellitus with hyperglycemia ([place]) 01/31/2018  . Hypertensive urgency 07/12/2017  . LBBB (left bundle branch block) 04/26/2017  . COPD with chronic bronchitis ([place]) 08/26/2016  . Kidney stone on right side 10/25/2015  . CKD (chronic kidney disease), stage III ([place]) 10/13/2015  . Gout 10/13/2015  . CAD Status post CABG 2 SVG to OM 1, LIMA to mid LAD: 10/13/2015  . Cardiomyopathy, ischemic, chronic systolic CHF 92/95/7473  . Essential hypertension 09/04/2014  . Benign prostatic hyperplasia with lower urinary tract symptoms 01/24/2013  . Peyronie's disease 12/18/2012  . Chronic joint pain 06/22/2012  . History of tobacco abuse 01/26/2012  . Inguinal hernia, right 01/26/2012    Past Surgical History:  Procedure Laterality Date  . BIV ICD INSERTION CRT-D N/A 10/21/2017   Procedure: BIV ICD INSERTION CRT-D;  Surgeon: Constance Haw, MD;  Location: [facility];  Service: Cardiovascular;  Laterality:  N/A;  . CORONARY ARTERY BYPASS GRAFT  2010  . KNEE SURGERY    . RIGHT/LEFT HEART CATH AND CORONARY ANGIOGRAPHY N/A 04/15/2017   Procedure: Right/Left Heart Cath and Coronary Angiography;  Surgeon: Larey Dresser, MD;  Location: [facility];  Service: Cardiovascular;  Laterality: N/A;  . ROTATOR CUFF REPAIR    . WRIST SURGERY          Home Medications    Prior to Admission medications   Medication Sig Start Date End Date Taking? Authorizing Provider  albuterol (PROVENTIL HFA;VENTOLIN HFA) 108 (90  Base) MCG/ACT inhaler Inhale 2 puffs into the lungs every 6 (six) hours as needed for wheezing or shortness of breath.    [provider]  albuterol (PROVENTIL) (2.5 MG/3ML) 0.083% nebulizer solution INHALE CONTENTS OF 1 VIAL VIA NEBULIZER EVERY 6 HOURS AS NEEDED FOR WHEEZING/SHORTNESS OF BREATH Patient taking differently: Take 2.5 mg by nebulization every 6 (six) hours as needed for wheezing or shortness of breath.  11/11/18   Shelda Pal, DO  allopurinol (ZYLOPRIM) 300 MG tablet Take 1 tablet (300 mg total) by mouth 2 (two) times daily. 02/15/19   Shelda Pal, DO  aspirin EC 81 MG EC tablet Take 1 tablet (81 mg total) by mouth daily. 04/16/17   Clegg, Amy D, NP  atorvastatin (LIPITOR) 80 MG tablet Take 1 tablet (80 mg total) by mouth daily. 08/26/16   Shelda Pal, DO  carvedilol (COREG) 25 MG tablet Take 1 tablet (25 mg total) by mouth 2 (two) times daily with a meal. 06/10/19   [place] Donnamarie Poag, DO  celecoxib (CELEBREX) 100 MG capsule TAKE 1 CAPSULE BY MOUTH TWICE A DAY 03/31/19   Wendling, Crosby Oyster, DO  COLCRYS 0.6 MG tablet TAKE 1 TABLET BY MOUTH TWICE A DAY Patient taking differently: Take 0.6 mg by mouth daily as needed (gout).  01/28/18   Shelda Pal, DO  digoxin (LANOXIN) 0.125 MG tablet Take 1 tablet (125 mcg total) by mouth daily. 06/10/19   [place] Donnamarie Poag, DO  DULoxetine (CYMBALTA) 20 MG capsule TAKE 1 CAPSULE BY MOUTH EVERY DAY 06/07/19   Wendling, Crosby Oyster, DO  gabapentin (NEURONTIN) 600 MG tablet Take 1 tablet (600 mg total) by mouth 2 (two) times daily. 05/12/19   Shelda Pal, DO  glipiZIDE (GLUCOTROL) 5 MG tablet Take 1 tablet (5 mg total) by mouth daily before breakfast. 05/12/19   Wendling, Crosby Oyster, DO  glucose blood test strip Use as instructed 08/26/16   Shelda Pal, DO  Investigational - Study Medication Take 1 tablet by mouth 2 (two) times daily. Study name: Galactic HF Study Additional study  details: Omecamtiv Mecarbil or Placebo Patient taking differently: Take 1 tablet by mouth 2 (two) times daily. Study name: Galactic HF Study Additional study details: Omecamtiv Mecarbil or Placebo Managed by Dr. Aundra Dubin [facility] RN) 04/15/17   Larey Dresser, MD  Lancets (FREESTYLE) lancets Use to check sugars daily 08/26/16   Shelda Pal, DO  Lidocaine 1.8 % PTCH Apply 1 patch topically daily as needed (pain). 10/23/18   Fawze, Mina A, PA-C  oxyCODONE-acetaminophen (PERCOCET) 5-325 MG tablet Take 1 tablet by mouth every 6 (six) hours as needed. 06/12/19   Charlesetta Shanks, MD  predniSONE (DELTASONE) 5 MG tablet Take 4 tablets (20 mg total) by mouth daily with breakfast for 2 days, THEN 2 tablets (10 mg total) daily with breakfast for 2 days, THEN 1 tablet (5 mg total) daily with  breakfast for 3 days. 06/11/19 06/18/19  [place] Eric J, DO  sacubitril-valsartan (ENTRESTO) 49-51 MG Take 1 tablet by mouth 2 (two) times daily. 06/10/19   [place] Donnamarie Poag, DO  torsemide (DEMADEX) 20 MG tablet Take 2 tablets (40 mg total) by mouth daily. 06/10/19   [place] Eric J, DO    Family History Family History  Problem Relation Age of Onset  . Hypertension Mother   . Diabetes Mother   . Hyperlipidemia Mother   . Kidney failure Mother   . Emphysema Father     Social History Social History   Tobacco Use  . Smoking status: Former Smoker    Packs/day: 0.50    Years: 30.00    Pack years: 15.00    Quit date: 07/10/2016    Years since quitting: 2.9  . Smokeless tobacco: Never Used  Substance Use Topics  . Alcohol use: No  . Drug use: No     Allergies   Hydrocodone and Tramadol   Review of Systems Review of Systems 10 Systems reviewed and are negative for acute change except as noted in the HPI.   Physical Exam Updated Vital Signs BP (!) 156/95 (BP Location: Right Arm)   Pulse 83   Temp 97.7 F (36.5 C) (Oral)   Resp 16   Ht 5\' 10"  (1.778 m)   Wt 86.2 kg   SpO2 98%   BMI 27.26  kg/m   Physical Exam Constitutional:      Comments: Patient is alert and appropriate.  No acute distress.  Respirations, nonlabored.  HENT:     Head: Normocephalic and atraumatic.     Comments: No contusions or abrasions to the scalp.    Nose: Nose normal.     Mouth/Throat:     Mouth: Mucous membranes are moist.     Pharynx: Oropharynx is clear.  Eyes:     Extraocular Movements: Extraocular movements intact.     Pupils: Pupils are equal, round, and reactive to light.  Neck:     Musculoskeletal: Neck supple.  Cardiovascular:     Rate and Rhythm: Normal rate and regular rhythm.     Pulses: Normal pulses.  Pulmonary:     Effort: Pulmonary effort is normal.     Breath sounds: Normal breath sounds.     Comments: Breath sounds are clear.  No respiratory distress.  Chest is nontender to palpation or compression. Abdominal:     General: There is no distension.     Palpations: Abdomen is soft.     Tenderness: There is no abdominal tenderness. There is no guarding.  Musculoskeletal:     Comments: Patient has moderate effusion to the right knee.  Very tender to palpation over the patella.  Not warm or erythematous.  Patient's left knee is grossly normal.  He does not have effusion or swelling.  (He previously had gout in this knee).  No significant peripheral edema.  The calves are soft and nontender.  Ankles without effusion or deformity.  Normal range of motion at left hip.  Patient indicates some discomfort at the high femur but no pain to direct palpation over the trochanter.  Skin:    General: Skin is warm and dry.  Neurological:     General: No focal deficit present.     Mental Status: He is oriented to person, place, and time.     Coordination: Coordination normal.     Comments: Patient has antalgic gait with any weightbearing on the right knee.  He  is using crutches.  Psychiatric:        Mood and Affect: Mood normal.      ED Treatments / Results  Labs (all labs ordered are  listed, but only abnormal results are displayed) Labs Reviewed - No data to display  EKG    Radiology Dg Knee Complete 4 Views Right  Result Date: 06/12/2019 CLINICAL DATA:  Fall EXAM: RIGHT KNEE - COMPLETE 4+ VIEW COMPARISON:  01/22/2019 FINDINGS: Negative for fracture. Moderate tricompartmental degenerative change with joint space narrowing and spurring. Moderate joint effusion which has progressed in the interval. IMPRESSION: Negative for fracture Degenerative change and joint effusion. Electronically Signed   By: Franchot Gallo M.D.   On: 06/12/2019 17:25   Dg Hip Unilat With Pelvis 2-3 Views Left  Result Date: 06/12/2019 CLINICAL DATA:  Left hip pain post fall. EXAM: DG HIP (WITH OR WITHOUT PELVIS) 2-3V LEFT COMPARISON:  None. FINDINGS: There is no evidence of hip fracture or dislocation. There is no evidence of arthropathy or other focal bone abnormality. IMPRESSION: Negative. Electronically Signed   By: Fidela Salisbury M.D.   On: 06/12/2019 17:26    Procedures Procedures (including critical care time)  Medications Ordered in ED Medications  HYDROcodone-acetaminophen (NORCO/VICODIN) 5-325 MG per tablet 1 tablet (1 tablet Oral Given 06/12/19 1643)     Initial Impression / Assessment and Plan / ED Course  I have reviewed the triage vital signs and the nursing notes.  Pertinent labs & imaging results that were available during my care of the patient were reviewed by me and considered in my medical decision making (see chart for details).       Patient presents with a fall yesterday.  Had direct contact with the right knee.  He does have a moderate effusion but no erythema or warmth.  X-ray is negative for fracture.  Consistent with knee sprain.  Patient has a neoprene knee compression device.  He is instructed to continue using this and to elevate and ice.  He already has crutches which she will continue to use.  Findings are consistent with an acute injury.  Patient also had  some moderate discomfort to the left hip but no acute fractures identified.  He had good range of motion without significant reproducible pain.  I do not suspect occult fracture.  Patient was discharged from the hospital 2 days ago.  He was significantly diuresed.  At this time he does appear to be in good condition.  His lungs are clear and he does not have dyspnea.  He is not volume overloaded.  He reports he felt weak when he tried to get up from the toilet.  I suspect he may have been mildly hypovolemic due to recent diuresis but description does not have signs or symptoms suggestive of ischemic or syncopal event.  I do feel patient safe for discharge and continued outpatient management.  Final Clinical Impressions(s) / ED Diagnoses   Final diagnoses:  Sprain of right knee, unspecified ligament, initial encounter  Effusion of right knee  Left hip pain  Fall, initial encounter    ED Discharge Orders         Ordered    oxyCODONE-acetaminophen (PERCOCET) 5-325 MG tablet  Every 6 hours PRN     06/12/19 1802           Charlesetta Shanks, MD 06/12/19 2145

## 2019-06-12 NOTE — Telephone Encounter (Signed)
Pt. Reports he fainted in the bathroom yesterday evening. He had a bowel movement and fainted. Thinks he "was out less than 5 minutes." Hit his forehead, but no bump or bruise. States he hurt his right ankle and knee. States he can not walk around on leg. "I haven't been up since it happened." Instructed to go to Ed for evaluation. Verbalizes understanding.  Reason for Disposition . [1] Age > 50 years  AND [2] now alert and feels fine  Answer Assessment - Initial Assessment Questions 1. ONSET: "How long were you unconscious?" (minutes) "When did it happen?"     Yesterday - less than 5 minutes 2. CONTENT: "What happened during period of unconsciousness?" (e.g., seizure activity)      Unknown 3. MENTAL STATUS: "Alert and oriented now?" (oriented x 3 = name, month, location)      Alert and oriented 4. TRIGGER: "What do you think caused the fainting?" "What were you doing just before you fainted?"  (e.g., exercise, sudden standing up, prolonged standing)     Had bowel movement  5. RECURRENT SYMPTOM: "Have you ever passed out before?" If so, ask: "When was the last time?" and "What happened that time?"      No 6. INJURY: "Did you sustain any injury during the fall?"      Hit head left side forehead, right knee and right ankle 7. CARDIAC SYMPTOMS: "Have you had any of the following symptoms: chest pain, difficulty breathing, palpitations?"     No 8. NEUROLOGIC SYMPTOMS: "Have you had any of the following symptoms: headache, numbness, vertigo, weakness?"     Headache 9. GI SYMPTOMS: "Have you had any of the following symptoms: abdominal pain, vomiting, diarrhea, blood in stools?"     No 10. OTHER SYMPTOMS: "Do you have any other symptoms?"       No 11. PREGNANCY: "Is there any chance you are pregnant?" "When was your last menstrual period?"       n/a  Protocols used: [facility]

## 2019-06-13 ENCOUNTER — Ambulatory Visit: Payer: Self-pay | Admitting: Family Medicine

## 2019-06-13 NOTE — Telephone Encounter (Signed)
Pt seen in ED yesterday S/P fall. C/O right knee pain, Imaging :   IMPRESSION: Negative for fracture  Degenerative change and joint effusion.  Pt calling to report "Pain medicine isn't helping and they didn't give me enough." States has 3 percocet left. States has been applying ice and keeping elevated. Pt reports "I can't even get up to the bathroom." Advised to call EMS if so pain severe. Pt declines. States his wife is at work and may consider when she returns home. Advised he can take ibuprofen between doses of percocet. Assured [place] would alert practice for Dr. Serita Sheller review as practice presently closed.   CB# [phone]  Reason for Disposition . [1] SEVERE pain AND [2] not improved 2 hours after pain medicine/ice packs  Answer Assessment - Initial Assessment Questions 1. MECHANISM: "How did the injury happen?" (e.g., twisting injury, direct blow)      *No Answer* 2. ONSET: "When did the injury happen?" (Minutes or hours ago)      *No Answer* 3. LOCATION: "Where is the injury located?"      *No Answer* 4. APPEARANCE of INJURY: "What does the injury look like?"      *No Answer* 5. SEVERITY: "Can you put weight on that leg?" "Can you walk?"      *No Answer* 6. SIZE: For cuts, bruises, or swelling, ask: "How large is it?" (e.g., inches or centimeters;  entire joint)      *No Answer* 7. PAIN: "Is there pain?" If so, ask: "How bad is the pain?"    (e.g., Scale 1-10; or mild, moderate, severe)     *No Answer* 8. TETANUS: For any breaks in the skin, ask: "When was the last tetanus booster?"     *No Answer* 9. OTHER SYMPTOMS: "Do you have any other symptoms?"  (e.g., "pop" when knee injured, swelling, locking, buckling)      *No Answer* 10. PREGNANCY: "Is there any chance you are pregnant?" "When was your last menstrual period?"       *No Answer*  Protocols used: KNEE INJURY-A-AH

## 2019-06-14 ENCOUNTER — Telehealth: Payer: Self-pay | Admitting: Family Medicine

## 2019-06-14 NOTE — Telephone Encounter (Signed)
Might need to be seen in person and get an injection. If terrible, may need to go to urgent care. If he needs to be seen Th, schedule with a provider seeing pts in office or he can wait to see me Friday. Ty.

## 2019-06-14 NOTE — Telephone Encounter (Signed)
Copied from [place] [id number]. Topic: General - Inquiry >> Jun 14, 2019  9:06 AM Richardo Priest, NT wrote: Reason for CRM: Patient is checking in to see if Dr.Wendling got the message. Requesting call back.   Have routed original message to PCP to review, but in clinic currently seeing patients.

## 2019-06-15 ENCOUNTER — Inpatient Hospital Stay (HOSPITAL_COMMUNITY): Payer: Medicare Other

## 2019-06-15 NOTE — Telephone Encounter (Signed)
Patient has scheduled appt for tomorrow 06/16/2019

## 2019-06-15 NOTE — Telephone Encounter (Signed)
Called left message to call back 

## 2019-06-15 NOTE — Telephone Encounter (Signed)
Patient scheduled appt with PCP on 06/16/2019

## 2019-06-16 ENCOUNTER — Ambulatory Visit: Payer: Medicare Other | Admitting: Family Medicine

## 2019-06-28 ENCOUNTER — Encounter: Payer: Self-pay | Admitting: *Deleted

## 2019-06-28 DIAGNOSIS — Z006 Encounter for examination for normal comparison and control in clinical research program: Secondary | ICD-10-CM

## 2019-06-28 NOTE — Research (Addendum)
Spoke with patient via phone for Galactic week 288/ EOS visit. No AE/SAE/PEP to report. Per pt " I don't have no med or boxes. I trashed them when I moved last year. I took all the pill." I thanked him for his participation in the study.

## 2019-06-29 NOTE — Progress Notes (Signed)
[facility] Clinic Note - Initial Visit   Date: 07/03/19  Patrick Stewart MRN: [id number] DOB: [birth year]   Dear Dr. Nani Ravens:  Thank you for your kind referral of Patrick Stewart for consultation of diabetic neuropathy. Although his history is well known to you, please allow [place] to reiterate it for the purpose of our medical record. The patient was accompanied to the clinic by self.    History of Present Illness: Patrick Stewart is a 63 y.o. right-handed male with CAD s/p CABG, CHF with ICD, CKD, hypertension, hyperlipidemia, and diabetes mellitus presenting for evaluation of diabetic neuropathy.   Starting in early 2020, he began having episodic shooting/burning pain in the ankle, lower legs, and feet. Pain is worse at night and sometimes wakes him up from sleeping.  He also has constant numbness over the toes and soles of the feet.  He complains of imbalance and has suffered one fall.  He walks unassisted.  There is no weakness.  He is taking gabapentin 600 mg twice daily and Cymbalta 20 mg daily.  He feels "drunk" with Cymbalta and does not want to increase the dose.  He does report benefit with gabapentin, however this cannot be increased due to his renal dysfunction. He developed cognitive side effects with amitriptyline and tramadol.  No history of alcohol use or family history of neuropathy.  Out-side paper records, electronic medical record, and images have been reviewed where available and summarized as:  Lab Results  Component Value Date   HGBA1C 6.8 (H) 06/05/2019    Past Medical History:  Diagnosis Date  . CAD (coronary artery disease) 2010   3v CABG  . CHF (congestive heart failure) ([place]) 01/26/2012  . CKD (chronic kidney disease) 02/19/2012  . COPD with chronic bronchitis ([place]) 08/26/2016  . Diabetes mellitus type 2, uncontrolled ([place]) 01/26/2012  . Elevated liver function tests 01/26/2012  . Gout 01/26/2012  . Hx of CABG 2010   RCA Stent  2008, CABG x 2 [facility] 2010  . Hypercholesterolemia 07/08/2016  . Hypertension 09/04/2014  . Ischemic cardiomyopathy 11/15/2014  . Kidney stone 10/25/2015  . Lipid disorder 01/26/2012  . Noncompliance 10/18/2018  . NSTEMI (non-ST elevated myocardial infarction) ([place]) 07/08/2016  . ST elevation myocardial infarction (STEMI) ([place]) 2010  . Unstable angina ([place]) 11/14/2014    Past Surgical History:  Procedure Laterality Date  . BIV ICD INSERTION CRT-D N/A 10/21/2017   Procedure: BIV ICD INSERTION CRT-D;  Surgeon: Constance Haw, MD;  Location: [facility];  Service: Cardiovascular;  Laterality: N/A;  . CORONARY ARTERY BYPASS GRAFT  2010  . KNEE SURGERY    . RIGHT/LEFT HEART CATH AND CORONARY ANGIOGRAPHY N/A 04/15/2017   Procedure: Right/Left Heart Cath and Coronary Angiography;  Surgeon: Larey Dresser, MD;  Location: [facility];  Service: Cardiovascular;  Laterality: N/A;  . ROTATOR CUFF REPAIR    . WRIST SURGERY       Medications:  Outpatient Encounter Medications as of 07/03/2019  Medication Sig Note  . albuterol (PROVENTIL HFA;VENTOLIN HFA) 108 (90 Base) MCG/ACT inhaler Inhale 2 puffs into the lungs every 6 (six) hours as needed for wheezing or shortness of breath.   Marland Kitchen albuterol (PROVENTIL) (2.5 MG/3ML) 0.083% nebulizer solution INHALE CONTENTS OF 1 VIAL VIA NEBULIZER EVERY 6 HOURS AS NEEDED FOR WHEEZING/SHORTNESS OF BREATH (Patient taking differently: Take 2.5 mg by nebulization every 6 (six) hours as needed for wheezing or shortness of breath. )   . allopurinol (ZYLOPRIM) 300 MG  tablet Take 1 tablet (300 mg total) by mouth 2 (two) times daily.   Marland Kitchen aspirin EC 81 MG EC tablet Take 1 tablet (81 mg total) by mouth daily.   Marland Kitchen atorvastatin (LIPITOR) 80 MG tablet Take 1 tablet (80 mg total) by mouth daily.   . carvedilol (COREG) 25 MG tablet Take 1 tablet (25 mg total) by mouth 2 (two) times daily with a meal.   . digoxin (LANOXIN) 0.125 MG tablet Take 1 tablet (125 mcg  total) by mouth daily.   . DULoxetine (CYMBALTA) 20 MG capsule TAKE 1 CAPSULE BY MOUTH EVERY DAY   . gabapentin (NEURONTIN) 600 MG tablet Take 1 tablet (600 mg total) by mouth 2 (two) times daily.   Marland Kitchen glipiZIDE (GLUCOTROL) 5 MG tablet Take 1 tablet (5 mg total) by mouth daily before breakfast. 06/05/2019: Pt comments that he is supposed to be taking half a tablet (2.5mg  but he takes a whole tablet instead.  Marland Kitchen glucose blood test strip Use as instructed   . Lancets (FREESTYLE) lancets Use to check sugars daily   . sacubitril-valsartan (ENTRESTO) 49-51 MG Take 1 tablet by mouth 2 (two) times daily.   Marland Kitchen torsemide (DEMADEX) 20 MG tablet Take 2 tablets (40 mg total) by mouth daily.   . [DISCONTINUED] celecoxib (CELEBREX) 100 MG capsule TAKE 1 CAPSULE BY MOUTH TWICE A DAY   . [DISCONTINUED] COLCRYS 0.6 MG tablet TAKE 1 TABLET BY MOUTH TWICE A DAY (Patient taking differently: Take 0.6 mg by mouth daily as needed (gout). )   . [DISCONTINUED] Investigational - Study Medication Take 1 tablet by mouth 2 (two) times daily. Study name: Galactic HF Study Additional study details: Omecamtiv Mecarbil or Placebo (Patient taking differently: Take 1 tablet by mouth 2 (two) times daily. Study name: Galactic HF Study Additional study details: Omecamtiv Mecarbil or Placebo Managed by Dr. Aundra Dubin [facility] RN))   . [DISCONTINUED] Lidocaine 1.8 % PTCH Apply 1 patch topically daily as needed (pain).   . [DISCONTINUED] oxyCODONE-acetaminophen (PERCOCET) 5-325 MG tablet Take 1 tablet by mouth every 6 (six) hours as needed.   . nortriptyline (PAMELOR) 10 MG capsule Take 1 capsule (10 mg total) by mouth at bedtime.    No facility-administered encounter medications on file as of 07/03/2019.     Allergies:  Allergies  Allergen Reactions  . Amitriptyline Other (See Comments)    drowsy  . Hydrocodone Itching  . Tramadol Nausea Only    Family History: Family History  Problem Relation Age of Onset  . Hypertension Mother   .  Diabetes Mother   . Hyperlipidemia Mother   . Kidney failure Mother   . Emphysema Father     Social History: Social History   Tobacco Use  . Smoking status: Former Smoker    Packs/day: 0.50    Years: 30.00    Pack years: 15.00    Quit date: 07/10/2016    Years since quitting: 2.9  . Smokeless tobacco: Never Used  Substance Use Topics  . Alcohol use: No  . Drug use: No   Social History   Social History Narrative   Right handed   Lives in a single story home with wife   Disabled since 2010 for CAD, previously worked at [employer].     Review of Systems:  CONSTITUTIONAL: No fevers, chills, night sweats, or weight loss.   EYES: No visual changes or eye pain ENT: No hearing changes.  No history of nose bleeds.   RESPIRATORY: No cough, wheezing  and shortness of breath.   CARDIOVASCULAR: Negative for chest pain, and palpitations.   GI: Negative for abdominal discomfort, blood in stools or black stools.  No recent change in bowel habits.   GU:  No history of incontinence.   MUSCLOSKELETAL: No history of joint pain or swelling.  No myalgias.   SKIN: Negative for lesions, rash, and itching.   HEMATOLOGY/ONCOLOGY: Negative for prolonged bleeding, bruising easily, and swollen nodes.  No history of cancer.   ENDOCRINE: Negative for cold or heat intolerance, polydipsia or goiter.   PSYCH:  No depression or anxiety symptoms.   NEURO: As Above.   Vital Signs:  BP (!) 158/72   Pulse 71   Ht 5\' 10"  (1.778 m)   Wt 174 lb (78.9 kg)   SpO2 94%   BMI 24.97 kg/m    General Medical Exam:   General:  Well appearing, comfortable.   Eyes/ENT: see cranial nerve examination.   Neck:   No carotid bruits. Respiratory:  Clear to auscultation, good air entry bilaterally.   Cardiac:  Regular rate and rhythm, no murmur.   Extremities:  No deformities, edema, or skin discoloration.  Skin:  No rashes or lesions.  Neurological Exam: MENTAL STATUS including orientation to time, place,  person, recent and remote memory, attention span and concentration, language, and fund of knowledge is normal.  Speech is not dysarthric.  CRANIAL NERVES: II:  No visual field defects.   III-IV-VI: Pupils equal round and reactive to light.  Normal conjugate, extra-ocular eye movements in all directions of gaze.  No nystagmus.  No ptosis.   V:  Normal facial sensation.    VII:  Normal facial symmetry and movements.   VIII:  Normal hearing and vestibular function.   IX-X:  Normal palatal movement.   XI:  Normal shoulder shrug and head rotation.   XII:  Normal tongue strength and range of motion, no deviation or fasciculation.  MOTOR: Mild generalized loss of muscle bulk in the lower legs bilaterally.  No fasciculations or abnormal movements.  No pronator drift.   Upper Extremity:  Right  Left  Deltoid  5/5   5/5   Biceps  5/5   5/5   Triceps  5/5   5/5   Infraspinatus 5/5  5/5  Medial pectoralis 5/5  5/5  Wrist extensors  5/5   5/5   Wrist flexors  5/5   5/5   Finger extensors  5/5   5/5   Finger flexors  5/5   5/5   Dorsal interossei  5/5   5/5   Abductor pollicis  5/5   5/5   Tone (Ashworth scale)  0  0   Lower Extremity:  Right  Left  Hip flexors  5/5   5/5   Hip extensors  5/5   5/5   Adductor 5/5  5/5  Abductor 5/5  5/5  Knee flexors  5/5   5/5   Knee extensors  5/5   5/5   Dorsiflexors  5/5   5/5   Plantarflexors  5/5   5/5   Toe extensors  5/5   5/5   Toe flexors  5/5   5/5   Tone (Ashworth scale)  0  0   MSRs:  Right        Left                  brachioradialis 2+  2+  biceps 2+  2+  triceps 2+  2+  patellar 2+  2+  ankle jerk 0  0  Hoffman no  no  plantar response down  down   SENSORY: Severely reduced vibration at the great toe bilaterally, normal on both ankles.  Reduced temperature over the dorsum of the feet.  Pinprick and proprioception is intact.  Mild sway with Romberg testing.   COORDINATION/GAIT: Normal finger-to- nose-finger and heel-to-shin.   Intact rapid alternating movements bilaterally.  Unable to rise from a chair without using arms.  Gait mildly-wide based and stable, unassisted.  There is mild unsteadiness with tandem and stress gait, however he is able to perform this.    IMPRESSION: Painful diabetic neuropathy affecting the feet and lower legs  - Renally dose gabapentin, limit to gabapentin 600mg  BID  - Continue Cymbalta 20mg  daily  - Start nortriptyline 10mg  at bedtime.  QTC 444  - If he is tolerating nortriptyline, will titrate this further and stop Cymbalta  - Check TSH, vitamin B12, vitamin B1, folate, SPEP with IFE, copper  - Fall precautions and daily foot inspection discussed  - Call with update in 2 weeks  Return to clinic in 3 months.    Thank you for allowing me to participate in patient's care.  If I can answer any additional questions, I would be pleased to do so.    Sincerely,     K. Posey Pronto, DO

## 2019-07-03 ENCOUNTER — Ambulatory Visit (INDEPENDENT_AMBULATORY_CARE_PROVIDER_SITE_OTHER): Payer: Medicare Other | Admitting: Neurology

## 2019-07-03 ENCOUNTER — Encounter: Payer: Self-pay | Admitting: Neurology

## 2019-07-03 ENCOUNTER — Other Ambulatory Visit: Payer: Self-pay

## 2019-07-03 ENCOUNTER — Telehealth: Payer: Self-pay

## 2019-07-03 ENCOUNTER — Other Ambulatory Visit (INDEPENDENT_AMBULATORY_CARE_PROVIDER_SITE_OTHER): Payer: Medicare Other

## 2019-07-03 ENCOUNTER — Telehealth: Payer: Self-pay | Admitting: Cardiology

## 2019-07-03 VITALS — BP 158/72 | HR 71 | Ht 70.0 in | Wt 174.0 lb

## 2019-07-03 DIAGNOSIS — E1142 Type 2 diabetes mellitus with diabetic polyneuropathy: Secondary | ICD-10-CM

## 2019-07-03 DIAGNOSIS — I255 Ischemic cardiomyopathy: Secondary | ICD-10-CM

## 2019-07-03 MED ORDER — NORTRIPTYLINE HCL 10 MG PO CAPS: 10.0000 mg | ORAL_CAPSULE | Freq: Every day | ORAL | 3 refills | Status: DC

## 2019-07-03 NOTE — Telephone Encounter (Signed)
New Message ° ° ° ° °Left message to confirm appt and answer COVID Questions  °

## 2019-07-03 NOTE — Patient Instructions (Addendum)
Start nortriptyline 10mg  at bedtime   Continue gabapenitn 600mg  BID  Check labs Your provider has requested that you have labwork completed today. Please go to [facility] ([id number]) on the second floor of this building before leaving the office today. You do not need to check in. If you are not called within 15 minutes please check with the front desk.   Call with update in 2 weeks  Return to clinic 3 months

## 2019-07-03 NOTE — Telephone Encounter (Signed)
Patient referred to [facility] clinic by Dr Curt Bears.  Attempted ICM referral call and left message to return call.

## 2019-07-04 ENCOUNTER — Encounter: Payer: Medicare Other | Admitting: Cardiology

## 2019-07-05 LAB — IMMUNOFIXATION ELECTROPHORESIS
IgG (Immunoglobin G), Serum: 2535 mg/dL — ABNORMAL HIGH (ref 600–1540)
IgM, Serum: 44 mg/dL — ABNORMAL LOW (ref 50–300)
Immunoglobulin A: 609 mg/dL — ABNORMAL HIGH (ref 70–320)

## 2019-07-06 LAB — PROTEIN ELECTROPHORESIS, SERUM
Albumin ELP: 3.1 g/dL — ABNORMAL LOW (ref 3.8–4.8)
Alpha 1: 0.4 g/dL — ABNORMAL HIGH (ref 0.2–0.3)
Alpha 2: 0.9 g/dL (ref 0.5–0.9)
Beta 2: 0.7 g/dL — ABNORMAL HIGH (ref 0.2–0.5)
Beta Globulin: 0.5 g/dL (ref 0.4–0.6)
Gamma Globulin: 2.2 g/dL — ABNORMAL HIGH (ref 0.8–1.7)
Total Protein: 7.8 g/dL (ref 6.1–8.1)

## 2019-07-07 LAB — EXTRA SPECIMEN

## 2019-07-07 LAB — COPPER, SERUM

## 2019-07-07 LAB — TEST AUTHORIZATION

## 2019-07-07 LAB — TSH: TSH: 2.48 mIU/L (ref 0.40–4.50)

## 2019-07-07 LAB — B12 AND FOLATE PANEL
Folate: 4.4 ng/mL — ABNORMAL LOW
Vitamin B-12: 551 pg/mL (ref 200–1100)

## 2019-07-13 ENCOUNTER — Telehealth: Payer: Self-pay

## 2019-07-13 DIAGNOSIS — R05 Cough: Secondary | ICD-10-CM | POA: Diagnosis not present

## 2019-07-13 DIAGNOSIS — I5043 Acute on chronic combined systolic (congestive) and diastolic (congestive) heart failure: Secondary | ICD-10-CM | POA: Diagnosis present

## 2019-07-13 DIAGNOSIS — I130 Hypertensive heart and chronic kidney disease with heart failure and stage 1 through stage 4 chronic kidney disease, or unspecified chronic kidney disease: Secondary | ICD-10-CM | POA: Diagnosis present

## 2019-07-13 DIAGNOSIS — Z95810 Presence of automatic (implantable) cardiac defibrillator: Secondary | ICD-10-CM | POA: Diagnosis not present

## 2019-07-13 DIAGNOSIS — Z20828 Contact with and (suspected) exposure to other viral communicable diseases: Secondary | ICD-10-CM | POA: Diagnosis present

## 2019-07-13 DIAGNOSIS — I5022 Chronic systolic (congestive) heart failure: Secondary | ICD-10-CM | POA: Diagnosis not present

## 2019-07-13 DIAGNOSIS — Z7984 Long term (current) use of oral hypoglycemic drugs: Secondary | ICD-10-CM | POA: Diagnosis not present

## 2019-07-13 DIAGNOSIS — I5023 Acute on chronic systolic (congestive) heart failure: Secondary | ICD-10-CM | POA: Diagnosis not present

## 2019-07-13 DIAGNOSIS — Z8249 Family history of ischemic heart disease and other diseases of the circulatory system: Secondary | ICD-10-CM | POA: Diagnosis not present

## 2019-07-13 DIAGNOSIS — I10 Essential (primary) hypertension: Secondary | ICD-10-CM | POA: Diagnosis not present

## 2019-07-13 DIAGNOSIS — R52 Pain, unspecified: Secondary | ICD-10-CM | POA: Diagnosis not present

## 2019-07-13 DIAGNOSIS — I160 Hypertensive urgency: Secondary | ICD-10-CM | POA: Diagnosis present

## 2019-07-13 DIAGNOSIS — R0602 Shortness of breath: Secondary | ICD-10-CM | POA: Diagnosis not present

## 2019-07-13 DIAGNOSIS — N183 Chronic kidney disease, stage 3 (moderate): Secondary | ICD-10-CM | POA: Diagnosis present

## 2019-07-13 DIAGNOSIS — E871 Hypo-osmolality and hyponatremia: Secondary | ICD-10-CM | POA: Diagnosis not present

## 2019-07-13 DIAGNOSIS — Z79899 Other long term (current) drug therapy: Secondary | ICD-10-CM | POA: Diagnosis not present

## 2019-07-13 DIAGNOSIS — A419 Sepsis, unspecified organism: Secondary | ICD-10-CM | POA: Diagnosis not present

## 2019-07-13 DIAGNOSIS — I255 Ischemic cardiomyopathy: Secondary | ICD-10-CM | POA: Diagnosis present

## 2019-07-13 DIAGNOSIS — I272 Pulmonary hypertension, unspecified: Secondary | ICD-10-CM | POA: Diagnosis present

## 2019-07-13 DIAGNOSIS — R5081 Fever presenting with conditions classified elsewhere: Secondary | ICD-10-CM | POA: Diagnosis not present

## 2019-07-13 DIAGNOSIS — D649 Anemia, unspecified: Secondary | ICD-10-CM | POA: Diagnosis present

## 2019-07-13 DIAGNOSIS — N189 Chronic kidney disease, unspecified: Secondary | ICD-10-CM | POA: Diagnosis not present

## 2019-07-13 DIAGNOSIS — J159 Unspecified bacterial pneumonia: Secondary | ICD-10-CM | POA: Diagnosis not present

## 2019-07-13 DIAGNOSIS — E1122 Type 2 diabetes mellitus with diabetic chronic kidney disease: Secondary | ICD-10-CM | POA: Diagnosis present

## 2019-07-13 DIAGNOSIS — M109 Gout, unspecified: Secondary | ICD-10-CM | POA: Diagnosis present

## 2019-07-13 DIAGNOSIS — I2510 Atherosclerotic heart disease of native coronary artery without angina pectoris: Secondary | ICD-10-CM | POA: Diagnosis present

## 2019-07-13 DIAGNOSIS — I472 Ventricular tachycardia: Secondary | ICD-10-CM | POA: Diagnosis not present

## 2019-07-13 DIAGNOSIS — M1611 Unilateral primary osteoarthritis, right hip: Secondary | ICD-10-CM | POA: Diagnosis not present

## 2019-07-13 DIAGNOSIS — Z950 Presence of cardiac pacemaker: Secondary | ICD-10-CM | POA: Diagnosis not present

## 2019-07-13 DIAGNOSIS — E785 Hyperlipidemia, unspecified: Secondary | ICD-10-CM | POA: Diagnosis present

## 2019-07-13 DIAGNOSIS — I509 Heart failure, unspecified: Secondary | ICD-10-CM | POA: Diagnosis not present

## 2019-07-13 DIAGNOSIS — Z951 Presence of aortocoronary bypass graft: Secondary | ICD-10-CM | POA: Diagnosis not present

## 2019-07-13 DIAGNOSIS — M1990 Unspecified osteoarthritis, unspecified site: Secondary | ICD-10-CM | POA: Diagnosis present

## 2019-07-13 DIAGNOSIS — R509 Fever, unspecified: Secondary | ICD-10-CM | POA: Diagnosis not present

## 2019-07-13 DIAGNOSIS — R7989 Other specified abnormal findings of blood chemistry: Secondary | ICD-10-CM | POA: Diagnosis not present

## 2019-07-13 DIAGNOSIS — M79641 Pain in right hand: Secondary | ICD-10-CM | POA: Diagnosis not present

## 2019-07-13 DIAGNOSIS — K219 Gastro-esophageal reflux disease without esophagitis: Secondary | ICD-10-CM | POA: Diagnosis present

## 2019-07-13 DIAGNOSIS — Z209 Contact with and (suspected) exposure to unspecified communicable disease: Secondary | ICD-10-CM | POA: Diagnosis not present

## 2019-07-13 DIAGNOSIS — J449 Chronic obstructive pulmonary disease, unspecified: Secondary | ICD-10-CM | POA: Diagnosis present

## 2019-07-13 DIAGNOSIS — R609 Edema, unspecified: Secondary | ICD-10-CM | POA: Diagnosis not present

## 2019-07-13 DIAGNOSIS — I083 Combined rheumatic disorders of mitral, aortic and tricuspid valves: Secondary | ICD-10-CM | POA: Diagnosis not present

## 2019-07-13 DIAGNOSIS — M7989 Other specified soft tissue disorders: Secondary | ICD-10-CM | POA: Diagnosis present

## 2019-07-13 DIAGNOSIS — R000 Tachycardia, unspecified: Secondary | ICD-10-CM | POA: Diagnosis not present

## 2019-07-13 NOTE — Telephone Encounter (Signed)
-----   Message from Alda Berthold, DO sent at 07/12/2019  8:58 AM EDT ----- Please inform patient that folate is very low and she needs to start folic acid 1mg  daily. Remaining labs are normal.  Can you also see if "test authorization" is something that needs to be printed and signed?  Thanks.

## 2019-07-13 NOTE — Telephone Encounter (Signed)
Patient aware of results and recommendations. °

## 2019-07-18 ENCOUNTER — Ambulatory Visit (INDEPENDENT_AMBULATORY_CARE_PROVIDER_SITE_OTHER): Payer: Medicare Other | Admitting: *Deleted

## 2019-07-18 DIAGNOSIS — I255 Ischemic cardiomyopathy: Secondary | ICD-10-CM

## 2019-07-18 LAB — CUP PACEART REMOTE DEVICE CHECK
Battery Remaining Longevity: 88 mo
Battery Voltage: 2.98 V
Brady Statistic AP VP Percent: 16.32 %
Brady Statistic AP VS Percent: 0.23 %
Brady Statistic AS VP Percent: 81.38 %
Brady Statistic AS VS Percent: 2.07 %
Brady Statistic RA Percent Paced: 16.31 %
Brady Statistic RV Percent Paced: 28.58 %
Date Time Interrogation Session: 20200721083623
HighPow Impedance: 51 Ohm
Implantable Lead Implant Date: 20181025
Implantable Lead Implant Date: 20181025
Implantable Lead Implant Date: 20181025
Implantable Lead Location: 753858
Implantable Lead Location: 753859
Implantable Lead Location: 753860
Implantable Lead Model: 4398
Implantable Lead Model: 5076
Implantable Pulse Generator Implant Date: 20181025
Lead Channel Impedance Value: 171 Ohm
Lead Channel Impedance Value: 171 Ohm
Lead Channel Impedance Value: 171 Ohm
Lead Channel Impedance Value: 171 Ohm
Lead Channel Impedance Value: 171 Ohm
Lead Channel Impedance Value: 285 Ohm
Lead Channel Impedance Value: 342 Ohm
Lead Channel Impedance Value: 342 Ohm
Lead Channel Impedance Value: 342 Ohm
Lead Channel Impedance Value: 342 Ohm
Lead Channel Impedance Value: 342 Ohm
Lead Channel Impedance Value: 380 Ohm
Lead Channel Impedance Value: 437 Ohm
Lead Channel Impedance Value: 608 Ohm
Lead Channel Impedance Value: 608 Ohm
Lead Channel Impedance Value: 608 Ohm
Lead Channel Impedance Value: 646 Ohm
Lead Channel Impedance Value: 646 Ohm
Lead Channel Pacing Threshold Amplitude: 0.625 V
Lead Channel Pacing Threshold Amplitude: 0.625 V
Lead Channel Pacing Threshold Amplitude: 1.5 V
Lead Channel Pacing Threshold Pulse Width: 0.4 ms
Lead Channel Pacing Threshold Pulse Width: 0.4 ms
Lead Channel Pacing Threshold Pulse Width: 0.5 ms
Lead Channel Sensing Intrinsic Amplitude: 12.625 mV
Lead Channel Sensing Intrinsic Amplitude: 12.625 mV
Lead Channel Sensing Intrinsic Amplitude: 3.375 mV
Lead Channel Sensing Intrinsic Amplitude: 3.375 mV
Lead Channel Setting Pacing Amplitude: 2 V
Lead Channel Setting Pacing Amplitude: 2.5 V
Lead Channel Setting Pacing Amplitude: 2.5 V
Lead Channel Setting Pacing Pulse Width: 0.4 ms
Lead Channel Setting Pacing Pulse Width: 0.5 ms
Lead Channel Setting Sensing Sensitivity: 0.3 mV

## 2019-07-25 ENCOUNTER — Other Ambulatory Visit: Payer: Self-pay | Admitting: Neurology

## 2019-08-02 ENCOUNTER — Encounter: Payer: Self-pay | Admitting: Cardiology

## 2019-08-02 NOTE — Progress Notes (Signed)
Remote ICD transmission.   

## 2019-08-09 NOTE — Telephone Encounter (Signed)
Attempted ICM referral call to patient and no answer or answering machine.  Unable to contact patient for ICM clinic enrollment and no further calls at this time.  Device clinic will continue to remote monitoring every 3 months per protocol.

## 2019-08-22 DIAGNOSIS — R7989 Other specified abnormal findings of blood chemistry: Secondary | ICD-10-CM | POA: Diagnosis not present

## 2019-08-22 DIAGNOSIS — R0789 Other chest pain: Secondary | ICD-10-CM | POA: Diagnosis not present

## 2019-08-22 DIAGNOSIS — R609 Edema, unspecified: Secondary | ICD-10-CM | POA: Diagnosis not present

## 2019-08-22 DIAGNOSIS — D72819 Decreased white blood cell count, unspecified: Secondary | ICD-10-CM | POA: Diagnosis not present

## 2019-08-22 DIAGNOSIS — R944 Abnormal results of kidney function studies: Secondary | ICD-10-CM | POA: Diagnosis not present

## 2019-08-22 DIAGNOSIS — Z950 Presence of cardiac pacemaker: Secondary | ICD-10-CM | POA: Diagnosis not present

## 2019-08-22 DIAGNOSIS — R000 Tachycardia, unspecified: Secondary | ICD-10-CM | POA: Diagnosis not present

## 2019-08-22 DIAGNOSIS — I517 Cardiomegaly: Secondary | ICD-10-CM | POA: Diagnosis not present

## 2019-08-22 DIAGNOSIS — R079 Chest pain, unspecified: Secondary | ICD-10-CM | POA: Diagnosis not present

## 2019-08-22 DIAGNOSIS — D649 Anemia, unspecified: Secondary | ICD-10-CM | POA: Diagnosis not present

## 2019-08-22 DIAGNOSIS — R2243 Localized swelling, mass and lump, lower limb, bilateral: Secondary | ICD-10-CM | POA: Diagnosis not present

## 2019-08-22 DIAGNOSIS — R600 Localized edema: Secondary | ICD-10-CM | POA: Diagnosis not present

## 2019-08-22 DIAGNOSIS — R072 Precordial pain: Secondary | ICD-10-CM | POA: Diagnosis not present

## 2019-08-22 DIAGNOSIS — R001 Bradycardia, unspecified: Secondary | ICD-10-CM | POA: Diagnosis not present

## 2019-08-22 DIAGNOSIS — R748 Abnormal levels of other serum enzymes: Secondary | ICD-10-CM | POA: Diagnosis not present

## 2019-08-22 DIAGNOSIS — I878 Other specified disorders of veins: Secondary | ICD-10-CM | POA: Diagnosis not present

## 2019-08-23 ENCOUNTER — Ambulatory Visit: Payer: Self-pay

## 2019-08-23 NOTE — Telephone Encounter (Signed)
FYI. Pt headed to ED.  

## 2019-08-23 NOTE — Telephone Encounter (Signed)
Pt. Reports he went an ED Tuesday morning because he had swelling and was not passing urine. Reports he was given Lasix and "never passed any urine in 3 days." States he has swelling in both legs up to his knees. Instructed to go to ED for evaluation of not passing urine and swelling.Verbalizes understanding.  Reason for Disposition . [1] Unable to urinate (or only a few drops) > 4 hours AND     [2] bladder feels very full (e.g., palpable bladder or strong urge to urinate)  Answer Assessment - Initial Assessment Questions 1. SYMPTOM: "What's the main symptom you're concerned about?" (e.g., frequency, incontinence)     Not passing urine 2. ONSET: "When did the  symptom start?"     Monday 3. PAIN: "Is there any pain?" If so, ask: "How bad is it?" (Scale: 1-10; mild, moderate, severe)     6 4. CAUSE: "What do you think is causing the symptoms?"     Unsure 5. OTHER SYMPTOMS: "Do you have any other symptoms?" (e.g., fever, flank pain, blood in urine, pain with urination)     No 6. PREGNANCY: "Is there any chance you are pregnant?" "When was your last menstrual period?"     n/a  Protocols used: [facility]

## 2019-08-26 DIAGNOSIS — J449 Chronic obstructive pulmonary disease, unspecified: Secondary | ICD-10-CM | POA: Diagnosis not present

## 2019-08-26 DIAGNOSIS — Z95810 Presence of automatic (implantable) cardiac defibrillator: Secondary | ICD-10-CM | POA: Diagnosis not present

## 2019-08-26 DIAGNOSIS — I160 Hypertensive urgency: Secondary | ICD-10-CM | POA: Diagnosis present

## 2019-08-26 DIAGNOSIS — M109 Gout, unspecified: Secondary | ICD-10-CM | POA: Diagnosis present

## 2019-08-26 DIAGNOSIS — K219 Gastro-esophageal reflux disease without esophagitis: Secondary | ICD-10-CM | POA: Diagnosis not present

## 2019-08-26 DIAGNOSIS — I248 Other forms of acute ischemic heart disease: Secondary | ICD-10-CM | POA: Diagnosis not present

## 2019-08-26 DIAGNOSIS — I110 Hypertensive heart disease with heart failure: Secondary | ICD-10-CM | POA: Diagnosis present

## 2019-08-26 DIAGNOSIS — M1990 Unspecified osteoarthritis, unspecified site: Secondary | ICD-10-CM | POA: Diagnosis present

## 2019-08-26 DIAGNOSIS — E119 Type 2 diabetes mellitus without complications: Secondary | ICD-10-CM | POA: Diagnosis present

## 2019-08-26 DIAGNOSIS — R7989 Other specified abnormal findings of blood chemistry: Secondary | ICD-10-CM | POA: Diagnosis not present

## 2019-08-26 DIAGNOSIS — I2510 Atherosclerotic heart disease of native coronary artery without angina pectoris: Secondary | ICD-10-CM | POA: Diagnosis present

## 2019-08-26 DIAGNOSIS — I5023 Acute on chronic systolic (congestive) heart failure: Secondary | ICD-10-CM | POA: Diagnosis present

## 2019-08-26 DIAGNOSIS — R0602 Shortness of breath: Secondary | ICD-10-CM | POA: Diagnosis not present

## 2019-08-26 DIAGNOSIS — I509 Heart failure, unspecified: Secondary | ICD-10-CM | POA: Diagnosis not present

## 2019-08-26 DIAGNOSIS — Z951 Presence of aortocoronary bypass graft: Secondary | ICD-10-CM | POA: Diagnosis not present

## 2019-09-10 ENCOUNTER — Emergency Department (HOSPITAL_COMMUNITY): Payer: Medicare Other

## 2019-09-10 ENCOUNTER — Other Ambulatory Visit: Payer: Self-pay

## 2019-09-10 ENCOUNTER — Inpatient Hospital Stay (HOSPITAL_COMMUNITY): Payer: Medicare Other

## 2019-09-10 ENCOUNTER — Inpatient Hospital Stay (HOSPITAL_COMMUNITY)
Admission: EM | Admit: 2019-09-10 | Discharge: 2019-09-14 | DRG: 291 | Disposition: A | Discharge status: Home / Self Care | Payer: Medicare Other | Attending: Internal Medicine | Admitting: Internal Medicine

## 2019-09-10 ENCOUNTER — Encounter (HOSPITAL_COMMUNITY): Payer: Self-pay | Admitting: *Deleted

## 2019-09-10 DIAGNOSIS — N179 Acute kidney failure, unspecified: Secondary | ICD-10-CM | POA: Diagnosis present

## 2019-09-10 DIAGNOSIS — R188 Other ascites: Secondary | ICD-10-CM | POA: Diagnosis present

## 2019-09-10 DIAGNOSIS — Z8249 Family history of ischemic heart disease and other diseases of the circulatory system: Secondary | ICD-10-CM

## 2019-09-10 DIAGNOSIS — E1122 Type 2 diabetes mellitus with diabetic chronic kidney disease: Secondary | ICD-10-CM | POA: Diagnosis present

## 2019-09-10 DIAGNOSIS — Z87442 Personal history of urinary calculi: Secondary | ICD-10-CM

## 2019-09-10 DIAGNOSIS — I5043 Acute on chronic combined systolic (congestive) and diastolic (congestive) heart failure: Secondary | ICD-10-CM

## 2019-09-10 DIAGNOSIS — J449 Chronic obstructive pulmonary disease, unspecified: Secondary | ICD-10-CM | POA: Diagnosis present

## 2019-09-10 DIAGNOSIS — R52 Pain, unspecified: Secondary | ICD-10-CM | POA: Diagnosis not present

## 2019-09-10 DIAGNOSIS — Z951 Presence of aortocoronary bypass graft: Secondary | ICD-10-CM | POA: Diagnosis not present

## 2019-09-10 DIAGNOSIS — I509 Heart failure, unspecified: Secondary | ICD-10-CM | POA: Diagnosis not present

## 2019-09-10 DIAGNOSIS — M25551 Pain in right hip: Secondary | ICD-10-CM | POA: Diagnosis not present

## 2019-09-10 DIAGNOSIS — I5021 Acute systolic (congestive) heart failure: Secondary | ICD-10-CM | POA: Diagnosis not present

## 2019-09-10 DIAGNOSIS — I2510 Atherosclerotic heart disease of native coronary artery without angina pectoris: Secondary | ICD-10-CM | POA: Diagnosis present

## 2019-09-10 DIAGNOSIS — M109 Gout, unspecified: Secondary | ICD-10-CM | POA: Diagnosis present

## 2019-09-10 DIAGNOSIS — R609 Edema, unspecified: Secondary | ICD-10-CM | POA: Diagnosis not present

## 2019-09-10 DIAGNOSIS — E785 Hyperlipidemia, unspecified: Secondary | ICD-10-CM | POA: Diagnosis present

## 2019-09-10 DIAGNOSIS — Z20828 Contact with and (suspected) exposure to other viral communicable diseases: Secondary | ICD-10-CM | POA: Diagnosis present

## 2019-09-10 DIAGNOSIS — I080 Rheumatic disorders of both mitral and aortic valves: Secondary | ICD-10-CM | POA: Diagnosis present

## 2019-09-10 DIAGNOSIS — I447 Left bundle-branch block, unspecified: Secondary | ICD-10-CM | POA: Diagnosis present

## 2019-09-10 DIAGNOSIS — N183 Chronic kidney disease, stage 3 (moderate): Secondary | ICD-10-CM | POA: Diagnosis present

## 2019-09-10 DIAGNOSIS — Z87891 Personal history of nicotine dependence: Secondary | ICD-10-CM | POA: Diagnosis not present

## 2019-09-10 DIAGNOSIS — Z885 Allergy status to narcotic agent status: Secondary | ICD-10-CM

## 2019-09-10 DIAGNOSIS — I351 Nonrheumatic aortic (valve) insufficiency: Secondary | ICD-10-CM | POA: Diagnosis not present

## 2019-09-10 DIAGNOSIS — Z841 Family history of disorders of kidney and ureter: Secondary | ICD-10-CM

## 2019-09-10 DIAGNOSIS — I361 Nonrheumatic tricuspid (valve) insufficiency: Secondary | ICD-10-CM | POA: Diagnosis not present

## 2019-09-10 DIAGNOSIS — I248 Other forms of acute ischemic heart disease: Secondary | ICD-10-CM | POA: Diagnosis present

## 2019-09-10 DIAGNOSIS — I5023 Acute on chronic systolic (congestive) heart failure: Secondary | ICD-10-CM

## 2019-09-10 DIAGNOSIS — I130 Hypertensive heart and chronic kidney disease with heart failure and stage 1 through stage 4 chronic kidney disease, or unspecified chronic kidney disease: Secondary | ICD-10-CM | POA: Diagnosis present

## 2019-09-10 DIAGNOSIS — E876 Hypokalemia: Secondary | ICD-10-CM | POA: Diagnosis present

## 2019-09-10 DIAGNOSIS — I272 Pulmonary hypertension, unspecified: Secondary | ICD-10-CM | POA: Diagnosis present

## 2019-09-10 DIAGNOSIS — M25461 Effusion, right knee: Secondary | ICD-10-CM | POA: Diagnosis not present

## 2019-09-10 DIAGNOSIS — Z833 Family history of diabetes mellitus: Secondary | ICD-10-CM

## 2019-09-10 DIAGNOSIS — I255 Ischemic cardiomyopathy: Secondary | ICD-10-CM | POA: Diagnosis present

## 2019-09-10 DIAGNOSIS — M1711 Unilateral primary osteoarthritis, right knee: Secondary | ICD-10-CM | POA: Diagnosis present

## 2019-09-10 DIAGNOSIS — Z8349 Family history of other endocrine, nutritional and metabolic diseases: Secondary | ICD-10-CM

## 2019-09-10 DIAGNOSIS — E7800 Pure hypercholesterolemia, unspecified: Secondary | ICD-10-CM | POA: Diagnosis present

## 2019-09-10 DIAGNOSIS — I472 Ventricular tachycardia: Secondary | ICD-10-CM | POA: Diagnosis present

## 2019-09-10 DIAGNOSIS — D631 Anemia in chronic kidney disease: Secondary | ICD-10-CM | POA: Diagnosis present

## 2019-09-10 DIAGNOSIS — Z825 Family history of asthma and other chronic lower respiratory diseases: Secondary | ICD-10-CM

## 2019-09-10 DIAGNOSIS — Z888 Allergy status to other drugs, medicaments and biological substances status: Secondary | ICD-10-CM

## 2019-09-10 DIAGNOSIS — Z7982 Long term (current) use of aspirin: Secondary | ICD-10-CM

## 2019-09-10 DIAGNOSIS — M25361 Other instability, right knee: Secondary | ICD-10-CM

## 2019-09-10 DIAGNOSIS — Z7984 Long term (current) use of oral hypoglycemic drugs: Secondary | ICD-10-CM

## 2019-09-10 DIAGNOSIS — I110 Hypertensive heart disease with heart failure: Secondary | ICD-10-CM | POA: Diagnosis not present

## 2019-09-10 DIAGNOSIS — Z95810 Presence of automatic (implantable) cardiac defibrillator: Secondary | ICD-10-CM

## 2019-09-10 DIAGNOSIS — R0602 Shortness of breath: Secondary | ICD-10-CM | POA: Diagnosis not present

## 2019-09-10 DIAGNOSIS — E1140 Type 2 diabetes mellitus with diabetic neuropathy, unspecified: Secondary | ICD-10-CM | POA: Diagnosis present

## 2019-09-10 DIAGNOSIS — N189 Chronic kidney disease, unspecified: Secondary | ICD-10-CM | POA: Diagnosis not present

## 2019-09-10 DIAGNOSIS — I252 Old myocardial infarction: Secondary | ICD-10-CM

## 2019-09-10 DIAGNOSIS — Z955 Presence of coronary angioplasty implant and graft: Secondary | ICD-10-CM

## 2019-09-10 DIAGNOSIS — Z79899 Other long term (current) drug therapy: Secondary | ICD-10-CM

## 2019-09-10 LAB — COMPREHENSIVE METABOLIC PANEL
ALT: 13 U/L (ref 0–44)
AST: 25 U/L (ref 15–41)
Albumin: 3 g/dL — ABNORMAL LOW (ref 3.5–5.0)
Alkaline Phosphatase: 104 U/L (ref 38–126)
Anion gap: 12 (ref 5–15)
BUN: 30 mg/dL — ABNORMAL HIGH (ref 8–23)
CO2: 27 mmol/L (ref 22–32)
Calcium: 8.8 mg/dL — ABNORMAL LOW (ref 8.9–10.3)
Chloride: 96 mmol/L — ABNORMAL LOW (ref 98–111)
Creatinine, Ser: 1.56 mg/dL — ABNORMAL HIGH (ref 0.61–1.24)
GFR calc Af Amer: 54 mL/min — ABNORMAL LOW (ref >60)
GFR calc non Af Amer: 47 mL/min — ABNORMAL LOW (ref >60)
Glucose, Bld: 143 mg/dL — ABNORMAL HIGH (ref 70–99)
Potassium: 3.6 mmol/L (ref 3.5–5.1)
Sodium: 135 mmol/L (ref 135–145)
Total Bilirubin: 2.4 mg/dL — ABNORMAL HIGH (ref 0.3–1.2)
Total Protein: 8.2 g/dL — ABNORMAL HIGH (ref 6.5–8.1)

## 2019-09-10 LAB — GLUCOSE, CAPILLARY
Glucose-Capillary: 131 mg/dL — ABNORMAL HIGH (ref 70–99)
Glucose-Capillary: 138 mg/dL — ABNORMAL HIGH (ref 70–99)
Glucose-Capillary: 167 mg/dL — ABNORMAL HIGH (ref 70–99)

## 2019-09-10 LAB — CBC WITH DIFFERENTIAL/PLATELET
Abs Immature Granulocytes: 0.02 10*3/uL (ref 0.00–0.07)
Basophils Absolute: 0 10*3/uL (ref 0.0–0.1)
Basophils Relative: 1 %
Eosinophils Absolute: 0.3 10*3/uL (ref 0.0–0.5)
Eosinophils Relative: 6 %
HCT: 37.3 % — ABNORMAL LOW (ref 39.0–52.0)
Hemoglobin: 11.7 g/dL — ABNORMAL LOW (ref 13.0–17.0)
Immature Granulocytes: 0 %
Lymphocytes Relative: 18 %
Lymphs Abs: 0.8 10*3/uL (ref 0.7–4.0)
MCH: 29.7 pg (ref 26.0–34.0)
MCHC: 31.4 g/dL (ref 30.0–36.0)
MCV: 94.7 fL (ref 80.0–100.0)
Monocytes Absolute: 0.7 10*3/uL (ref 0.1–1.0)
Monocytes Relative: 15 %
Neutro Abs: 2.9 10*3/uL (ref 1.7–7.7)
Neutrophils Relative %: 60 %
Platelets: 199 10*3/uL (ref 150–400)
RBC: 3.94 MIL/uL — ABNORMAL LOW (ref 4.22–5.81)
RDW: 17.8 % — ABNORMAL HIGH (ref 11.5–15.5)
WBC: 4.8 10*3/uL (ref 4.0–10.5)
nRBC: 0 % (ref 0.0–0.2)

## 2019-09-10 LAB — TROPONIN I (HIGH SENSITIVITY)
Troponin I (High Sensitivity): 52 ng/L — ABNORMAL HIGH (ref <18)
Troponin I (High Sensitivity): 53 ng/L — ABNORMAL HIGH (ref <18)

## 2019-09-10 LAB — SARS CORONAVIRUS 2 BY RT PCR (HOSPITAL ORDER, PERFORMED IN ~~LOC~~ HOSPITAL LAB): SARS Coronavirus 2: NEGATIVE

## 2019-09-10 LAB — BRAIN NATRIURETIC PEPTIDE: B Natriuretic Peptide: 2672.7 pg/mL — ABNORMAL HIGH (ref 0.0–100.0)

## 2019-09-10 MED ORDER — INFLUENZA VAC SPLIT QUAD 0.5 ML IM SUSY
0.5000 mL | PREFILLED_SYRINGE | INTRAMUSCULAR | Status: DC
  Filled 2019-09-10: qty 0.5

## 2019-09-10 MED ORDER — PNEUMOCOCCAL VAC POLYVALENT 25 MCG/0.5ML IJ INJ
0.5000 mL | INJECTION | INTRAMUSCULAR | Status: DC
  Filled 2019-09-10: qty 0.5

## 2019-09-10 MED ORDER — ASPIRIN EC 81 MG PO TBEC
81.0000 mg | DELAYED_RELEASE_TABLET | Freq: Every day | ORAL | Status: DC
  Administered 2019-09-10 – 2019-09-14 (×5): 81 mg via ORAL
  Filled 2019-09-10 (×5): qty 1

## 2019-09-10 MED ORDER — ENOXAPARIN SODIUM 40 MG/0.4ML ~~LOC~~ SOLN
40.0000 mg | Freq: Every day | SUBCUTANEOUS | Status: DC
  Administered 2019-09-10 – 2019-09-14 (×5): 40 mg via SUBCUTANEOUS
  Filled 2019-09-10 (×5): qty 0.4

## 2019-09-10 MED ORDER — COLCHICINE 0.6 MG PO TABS
0.6000 mg | ORAL_TABLET | Freq: Two times a day (BID) | ORAL | Status: DC
  Administered 2019-09-10 – 2019-09-11 (×3): 0.6 mg via ORAL
  Filled 2019-09-10 (×3): qty 1

## 2019-09-10 MED ORDER — SODIUM CHLORIDE 0.9 % IV SOLN: 250.0000 mL | INTRAVENOUS | Status: DC | PRN

## 2019-09-10 MED ORDER — GLIPIZIDE 5 MG PO TABS: 5.0000 mg | ORAL_TABLET | Freq: Every day | ORAL | Status: DC

## 2019-09-10 MED ORDER — TRAMADOL HCL 50 MG PO TABS
50.0000 mg | ORAL_TABLET | Freq: Four times a day (QID) | ORAL | Status: DC
  Administered 2019-09-10 (×2): 50 mg via ORAL
  Filled 2019-09-10 (×3): qty 1

## 2019-09-10 MED ORDER — DIGOXIN 125 MCG PO TABS
125.0000 ug | ORAL_TABLET | Freq: Every day | ORAL | Status: DC
  Administered 2019-09-10 – 2019-09-14 (×5): 125 ug via ORAL
  Filled 2019-09-10 (×5): qty 1

## 2019-09-10 MED ORDER — LIDOCAINE 5 % EX PTCH
2.0000 | MEDICATED_PATCH | CUTANEOUS | Status: DC
  Administered 2019-09-10 – 2019-09-14 (×4): 2 via TRANSDERMAL
  Filled 2019-09-10 (×5): qty 2

## 2019-09-10 MED ORDER — ALBUTEROL SULFATE (2.5 MG/3ML) 0.083% IN NEBU: 2.5000 mg | INHALATION_SOLUTION | Freq: Four times a day (QID) | RESPIRATORY_TRACT | Status: DC | PRN

## 2019-09-10 MED ORDER — SACUBITRIL-VALSARTAN 49-51 MG PO TABS
1.0000 | ORAL_TABLET | Freq: Two times a day (BID) | ORAL | Status: DC
  Administered 2019-09-10 – 2019-09-11 (×3): 1 via ORAL
  Filled 2019-09-10 (×3): qty 1

## 2019-09-10 MED ORDER — ALBUTEROL SULFATE HFA 108 (90 BASE) MCG/ACT IN AERS: 2.0000 | INHALATION_SPRAY | Freq: Four times a day (QID) | RESPIRATORY_TRACT | Status: DC | PRN

## 2019-09-10 MED ORDER — ACETAMINOPHEN 325 MG PO TABS: 650.0000 mg | ORAL_TABLET | ORAL | Status: DC | PRN

## 2019-09-10 MED ORDER — NORTRIPTYLINE HCL 10 MG PO CAPS
10.0000 mg | ORAL_CAPSULE | Freq: Every day | ORAL | Status: DC
  Administered 2019-09-10 – 2019-09-12 (×3): 10 mg via ORAL
  Filled 2019-09-10 (×5): qty 1

## 2019-09-10 MED ORDER — SODIUM CHLORIDE 0.9% FLUSH
3.0000 mL | Freq: Two times a day (BID) | INTRAVENOUS | Status: DC
  Administered 2019-09-10 – 2019-09-14 (×8): 3 mL via INTRAVENOUS

## 2019-09-10 MED ORDER — OXYCODONE-ACETAMINOPHEN 5-325 MG PO TABS
1.0000 | ORAL_TABLET | ORAL | Status: DC | PRN
  Administered 2019-09-10 – 2019-09-13 (×11): 1 via ORAL
  Filled 2019-09-10 (×12): qty 1

## 2019-09-10 MED ORDER — HYDROCODONE-ACETAMINOPHEN 5-325 MG PO TABS: 1.0000 | ORAL_TABLET | ORAL | Status: DC | PRN

## 2019-09-10 MED ORDER — COLCHICINE 0.6 MG PO TABS: 0.6000 mg | ORAL_TABLET | Freq: Two times a day (BID) | ORAL | Status: DC | PRN

## 2019-09-10 MED ORDER — ONDANSETRON HCL 4 MG/2ML IJ SOLN: 4.0000 mg | Freq: Four times a day (QID) | INTRAMUSCULAR | Status: DC | PRN

## 2019-09-10 MED ORDER — SODIUM CHLORIDE 0.9% FLUSH: 3.0000 mL | INTRAVENOUS | Status: DC | PRN

## 2019-09-10 MED ORDER — CARVEDILOL 12.5 MG PO TABS
12.5000 mg | ORAL_TABLET | Freq: Two times a day (BID) | ORAL | Status: DC
  Administered 2019-09-10 – 2019-09-14 (×7): 12.5 mg via ORAL
  Filled 2019-09-10 (×7): qty 1

## 2019-09-10 MED ORDER — FUROSEMIDE 10 MG/ML IJ SOLN
80.0000 mg | Freq: Once | INTRAMUSCULAR | Status: AC
  Administered 2019-09-10: 05:00:00 80 mg via INTRAVENOUS
  Filled 2019-09-10: qty 8

## 2019-09-10 MED ORDER — DULOXETINE HCL 20 MG PO CPEP
20.0000 mg | ORAL_CAPSULE | Freq: Every day | ORAL | Status: DC
  Administered 2019-09-10 – 2019-09-14 (×5): 20 mg via ORAL
  Filled 2019-09-10 (×5): qty 1

## 2019-09-10 MED ORDER — GABAPENTIN 300 MG PO CAPS
600.0000 mg | ORAL_CAPSULE | Freq: Two times a day (BID) | ORAL | Status: DC
  Administered 2019-09-10 – 2019-09-14 (×9): 600 mg via ORAL
  Filled 2019-09-10 (×9): qty 2

## 2019-09-10 MED ORDER — FUROSEMIDE 10 MG/ML IJ SOLN
8.0000 mg/h | INTRAVENOUS | Status: DC
  Administered 2019-09-10: 8 mg/h via INTRAVENOUS
  Filled 2019-09-10: qty 25

## 2019-09-10 MED ORDER — METOLAZONE 2.5 MG PO TABS
2.5000 mg | ORAL_TABLET | Freq: Once | ORAL | Status: AC
  Administered 2019-09-10: 2.5 mg via ORAL
  Filled 2019-09-10: qty 1

## 2019-09-10 MED ORDER — ATORVASTATIN CALCIUM 40 MG PO TABS
80.0000 mg | ORAL_TABLET | Freq: Every day | ORAL | Status: DC
  Administered 2019-09-10 – 2019-09-14 (×5): 80 mg via ORAL
  Filled 2019-09-10 (×5): qty 2

## 2019-09-10 NOTE — ED Notes (Signed)
Pt is requesting pain

## 2019-09-10 NOTE — H&P (Signed)
HPI  Patrick Stewart [id number] DOB: 10-08-1956 DOA: 09/10/2019  PCP: Shelda Pal, DO   Chief Complaint: Right knee pain, shortness of breath X 4 to 5 days  HPI:  63 year old black male-[place] resident lives with his wife at home originally from [facility] HFrEF systolic heart failure with pseudonormalization-last echocardiogram performed [facility] 07/13/2019 = EF 20% with severely reduced LV systolic function RV not visualized severe RAE-mild MR, mild-mod AR regurg, mod pulmonary HTN Also with PPM Medtronic placed 10/21/2017-class II indication decreased EF He has had the majority of his heart care [facility]-CABG X2 2010 Last admit [facility] health system 6/7-6/13/2020-discharge weight at that time 78 kg Other comorbidities DM TY 2-recent A1c 6.8, gout, AKI  He quit smoking about 2 years ago His main reason for coming to the hospital Is actually swelling in his abdomen for the past 3 to 4 days but mainly last night while he was watching the ball game, he felt an "buckling" of his right knee with immediate pain  Additionally endorses that he normally is able to walk around [place] with his wife without any difficulty but for the past 3 to 4 days has been sleeping in the chair He cannot climb stairs at baseline -Compliant on diet uses meds-wife cooks for him-denies dysuria denies other issues Has been increasingly short of breath however Can recite back some of the doses of his medications  Review of Systems:  Pertinent +'s: Short of breath, lower extremity swelling, inability to lay flat, difficulty walking usual amount, mild wheeze Pertinent -"s: - Sputum, - chills, - fever, - cough, - blurred vision, + unilateral weakness, - dysuria  ED Course: Given Lasix 80  Past Medical History:  Diagnosis Date  . CAD (coronary artery disease) 2010   3v CABG  . CHF (congestive heart failure) ([place]) 01/26/2012  . CKD (chronic kidney disease) 02/19/2012  . COPD with chronic bronchitis ([place])  08/26/2016  . Diabetes mellitus type 2, uncontrolled ([place]) 01/26/2012  . Elevated liver function tests 01/26/2012  . Gout 01/26/2012  . Hx of CABG 2010   RCA Stent 2008, CABG x 2 [facility] 2010  . Hypercholesterolemia 07/08/2016  . Hypertension 09/04/2014  . Ischemic cardiomyopathy 11/15/2014  . Kidney stone 10/25/2015  . Lipid disorder 01/26/2012  . Noncompliance 10/18/2018  . NSTEMI (non-ST elevated myocardial infarction) ([place]) 07/08/2016  . ST elevation myocardial infarction (STEMI) ([place]) 2010  . Unstable angina ([place]) 11/14/2014   Past Surgical History:  Procedure Laterality Date  . BIV ICD INSERTION CRT-D N/A 10/21/2017   Procedure: BIV ICD INSERTION CRT-D;  Surgeon: Constance Haw, MD;  Location: [facility] [facility];  Service: Cardiovascular;  Laterality: N/A;  . CORONARY ARTERY BYPASS GRAFT  2010  . KNEE SURGERY    . RIGHT/LEFT HEART CATH AND CORONARY ANGIOGRAPHY N/A 04/15/2017   Procedure: Right/Left Heart Cath and Coronary Angiography;  Surgeon: Larey Dresser, MD;  Location: [place] [facility];  Service: Cardiovascular;  Laterality: N/A;  . ROTATOR CUFF REPAIR    . WRIST SURGERY      reports that he quit smoking about 3 years ago. He has a 15.00 pack-year smoking history. He has never used smokeless tobacco. He reports that he does not drink alcohol or use drugs.  Mobility: Impaired  Allergies  Allergen Reactions  . Amitriptyline Other (See Comments)    drowsy  . Hydrocodone Itching  . Tramadol Nausea Only   Family History  Problem Relation Age of Onset  . Hypertension Mother   .  Diabetes Mother   . Hyperlipidemia Mother   . Kidney failure Mother   . Emphysema Father    Prior to Admission medications   Medication Sig Start Date End Date Taking? Authorizing Provider  albuterol (PROVENTIL HFA;VENTOLIN HFA) 108 (90 Base) MCG/ACT inhaler Inhale 2 puffs into the lungs every 6 (six) hours as needed for wheezing or shortness of breath.   Yes [provider]   albuterol (PROVENTIL) (2.5 MG/3ML) 0.083% nebulizer solution INHALE CONTENTS OF 1 VIAL VIA NEBULIZER EVERY 6 HOURS AS NEEDED FOR WHEEZING/SHORTNESS OF BREATH Patient taking differently: Take 2.5 mg by nebulization every 6 (six) hours as needed for wheezing or shortness of breath.  11/11/18  Yes Shelda Pal, DO  allopurinol (ZYLOPRIM) 300 MG tablet Take 1 tablet (300 mg total) by mouth 2 (two) times daily. 02/15/19  Yes Shelda Pal, DO  aspirin EC 81 MG EC tablet Take 1 tablet (81 mg total) by mouth daily. 04/16/17  Yes Clegg, Amy D, NP  atorvastatin (LIPITOR) 80 MG tablet Take 1 tablet (80 mg total) by mouth daily. 08/26/16  Yes Shelda Pal, DO  carvedilol (COREG) 25 MG tablet Take 1 tablet (25 mg total) by mouth 2 (two) times daily with a meal. 06/10/19  Yes [place] Donnamarie Poag, DO  Colchicine 0.6 MG CAPS Take 0.6 mg by mouth 2 (two) times daily as needed (gout).  08/30/19  Yes [provider]  digoxin (LANOXIN) 0.125 MG tablet Take 1 tablet (125 mcg total) by mouth daily. 06/10/19  Yes [place] Eric J, DO  DULoxetine (CYMBALTA) 20 MG capsule TAKE 1 CAPSULE BY MOUTH EVERY DAY Patient taking differently: Take 20 mg by mouth daily.  06/07/19  Yes Shelda Pal, DO  gabapentin (NEURONTIN) 600 MG tablet Take 1 tablet (600 mg total) by mouth 2 (two) times daily. 05/12/19  Yes Shelda Pal, DO  glipiZIDE (GLUCOTROL) 5 MG tablet Take 1 tablet (5 mg total) by mouth daily before breakfast. 05/12/19  Yes Wendling, Crosby Oyster, DO  nortriptyline (PAMELOR) 10 MG capsule TAKE 1 CAPSULE (10 MG TOTAL) BY MOUTH AT BEDTIME. 07/25/19  Yes Patel, Donika K, DO  sacubitril-valsartan (ENTRESTO) 49-51 MG Take 1 tablet by mouth 2 (two) times daily. 06/10/19  Yes [place] Eric J, DO  torsemide (DEMADEX) 20 MG tablet Take 2 tablets (40 mg total) by mouth daily. 06/10/19  Yes [place] Donnamarie Poag, DO  glucose blood test strip Use as instructed 08/26/16   Shelda Pal, DO   Lancets (FREESTYLE) lancets Use to check sugars daily 08/26/16   Shelda Pal, DO    Physical Exam:  Vitals:   09/10/19 0634 09/10/19 0846  BP: (!) 173/105 (!) 166/100  Pulse: 84 88  Resp: 16 16  Temp:    SpO2: 99% 100%     Awake alert coherent no distress EOMI NCAT no pallor no icterus  Significant JVD to jaw   chest is clinically With some congestion  Lower extremity grade 3-4 edema  Hepatojugular reflex is positive-abdomen is obese I cannot appreciate any hepatosplenomegaly however  Right knee is diffusely swollen-I cannot appreciate lateral or medial joint line tenderness  I did not do provocative McMurray's or other musculoskeletal maneuvers given the amount of pain that he was in  I have personally reviewed following labs and imaging studies  Labs:   Sodium 135 potassium 3.6 chloride 96 BUN/creatinine down from last 156/1.8-->30/1.5  Anion gap 12  Imaging studies:   CXR shows CABG with chronic cardiomegaly  no acute abnormality  Medical tests:   EKG independently reviewed: EKG shows sinus rhythm with IVCD and PVCs with ST-T wave depressions however there is also incomplete left bundle branch block which is similar to EKG performed 06/04/2019  Test discussed with performing physician:  Awaiting callback from cardiology Dr. Martinique  Decision to obtain old records:   Yes reviewed as extensively as possible  Review and summation of old records:   Yes  Active Problems:   CHF exacerbation (HCC)   Acute systolic CHF (congestive heart failure), NYHA class 3 (HCC)   Assessment/Plan Knee pain Buckle and snap crackle pop leads me to believe possible ligamentous sprain/strain-obtain 4 view x-ray right knee-placed in sleeve-if worse and unable to be weightbearing-orthopedics needs to be consulted AECHF HF R EF/ischemic cardiomyopathy Last Echo = EF 25%-has ICD class II category Medtronic Cardiology consulted-Lasix high-dose twice daily-hold  torsemide-continue Entresto-cut back Coreg 25-->12.5 twice daily given acute decompensation Dig level a.m. Will require some tuning up-current weight 84 kg last discharge dry weight 78 Does patient need anticoagulation?  Cardiology to address CAD-continue ASA 81, re-stratify outpatient lipids-Lipitor 80 daily Diabetes TY 2+ nephropathy + neuropathy Continue glipizide (evidence does not suggest holding causes any better glycemic control than not)-SSI, does not appear to be on insulin at home-continue gabapentin 600 twice daily, Pamelor 10 at bedtime, Cymbalta 20 daily-careful regarding sedation with these meds Prior gout Continue colchicine 0.6 twice daily as needed, allopurinol 300 twice daily Moderate pulmonary HTN Outpatient may require further work-up   Severity of Illness: The appropriate patient status for this patient is INPATIENT. Inpatient status is judged to be reasonable and necessary in order to provide the required intensity of service to ensure the patient's safety. The patient's presenting symptoms, physical exam findings, and initial radiographic and laboratory data in the context of their chronic comorbidities is felt to place them at high risk for further clinical deterioration. Furthermore, it is not anticipated that the patient will be medically stable for discharge from the hospital within 2 midnights of admission. The following factors support the patient status of inpatient.   " The patient's presenting symptoms include knee pain, S OB. " The worrisome physical exam findings include enlarged right knee. " The initial radiographic and laboratory data are worrisome because of awaiting x-rays. " The chronic co-morbidities include severe heart failure ICD.   * I certify that at the point of admission it is my clinical judgment that the patient will require inpatient hospital care spanning beyond 2 midnights from the point of admission due to high intensity of service, high  risk for further deterioration and high frequency of surveillance required.*  DVT prophylaxis: Lovenox Code Status: Full Family Communication: None Consults called: Cardiology  Time spent: 62 minutes  Verlon Au, MD [days-call my NP partners at night for Care related issues] [facility] --[facility] [employer] [place] , [email or web address]; password [facility]  09/10/2019, 8:53 AM

## 2019-09-10 NOTE — Plan of Care (Signed)
Patient will call for assistance when needing to get out of bed.  All belongings have been placed within reach.

## 2019-09-10 NOTE — ED Provider Notes (Signed)
[facility] DEPT Provider Note   CSN: [id number] Arrival date & time: 09/10/19  0144     History   Chief Complaint Chief Complaint  Patient presents with  . Leg Pain    HPI Barlow Ola is a 63 y.o. male.     Patient presents to the emergency department for evaluation of swelling.  Patient reports that he was admitted at [facility] 3 weeks ago for congestive heart failure.  He stayed in the hospital for several days and was improved when he left but since then has been slowly having increased swelling again.  He reports swelling of both of his legs up to his abdomen.  He is short of breath, especially with exertion.  He has not been able to sleep for the last couple of nights because lying flat makes his breathing worsened.  Tonight he noticed pain in the right leg.  He reports that when he tried to walk the entire leg was painful, denies any injury.     Past Medical History:  Diagnosis Date  . CAD (coronary artery disease) 2010   3v CABG  . CHF (congestive heart failure) ([place]) 01/26/2012  . CKD (chronic kidney disease) 02/19/2012  . COPD with chronic bronchitis ([place]) 08/26/2016  . Diabetes mellitus type 2, uncontrolled ([place]) 01/26/2012  . Elevated liver function tests 01/26/2012  . Gout 01/26/2012  . Hx of CABG 2010   RCA Stent 2008, CABG x 2 [facility] 2010  . Hypercholesterolemia 07/08/2016  . Hypertension 09/04/2014  . Ischemic cardiomyopathy 11/15/2014  . Kidney stone 10/25/2015  . Lipid disorder 01/26/2012  . Noncompliance 10/18/2018  . NSTEMI (non-ST elevated myocardial infarction) ([place]) 07/08/2016  . ST elevation myocardial infarction (STEMI) ([place]) 2010  . Unstable angina ([place]) 11/14/2014    Patient Active Problem List   Diagnosis Date Noted  . Acute exacerbation of CHF (congestive heart failure) ([place]) 06/05/2019  . Uncontrolled type 2 diabetes mellitus ([place]) 06/05/2019  . Diabetic polyneuropathy associated with type 2  diabetes mellitus ([place]) 02/15/2019  . Noncompliance 10/18/2018  . Noncompliance by refusing intervention or support 04/06/2018  . Uncontrolled type 2 diabetes mellitus with hyperglycemia ([place]) 01/31/2018  . Hypertensive urgency 07/12/2017  . LBBB (left bundle branch block) 04/26/2017  . COPD with chronic bronchitis ([place]) 08/26/2016  . Kidney stone on right side 10/25/2015  . CKD (chronic kidney disease), stage III ([place]) 10/13/2015  . Gout 10/13/2015  . CAD Status post CABG 2 SVG to OM 1, LIMA to mid LAD: 10/13/2015  . Cardiomyopathy, ischemic, chronic systolic CHF [id number]  . Essential hypertension 09/04/2014  . Benign prostatic hyperplasia with lower urinary tract symptoms 01/24/2013  . Peyronie's disease 12/18/2012  . Chronic joint pain 06/22/2012  . History of tobacco abuse 01/26/2012  . Inguinal hernia, right 01/26/2012    Past Surgical History:  Procedure Laterality Date  . BIV ICD INSERTION CRT-D N/A 10/21/2017   Procedure: BIV ICD INSERTION CRT-D;  Surgeon: Constance Haw, MD;  Location: [facility] CV LAB;  Service: Cardiovascular;  Laterality: N/A;  . CORONARY ARTERY BYPASS GRAFT  2010  . KNEE SURGERY    . RIGHT/LEFT HEART CATH AND CORONARY ANGIOGRAPHY N/A 04/15/2017   Procedure: Right/Left Heart Cath and Coronary Angiography;  Surgeon: Larey Dresser, MD;  Location: [facility];  Service: Cardiovascular;  Laterality: N/A;  . ROTATOR CUFF REPAIR    . WRIST SURGERY          Home Medications  Prior to Admission medications   Medication Sig Start Date End Date Taking? Authorizing Provider  albuterol (PROVENTIL HFA;VENTOLIN HFA) 108 (90 Base) MCG/ACT inhaler Inhale 2 puffs into the lungs every 6 (six) hours as needed for wheezing or shortness of breath.   Yes [provider]  albuterol (PROVENTIL) (2.5 MG/3ML) 0.083% nebulizer solution INHALE CONTENTS OF 1 VIAL VIA NEBULIZER EVERY 6 HOURS AS NEEDED FOR WHEEZING/SHORTNESS OF BREATH Patient  taking differently: Take 2.5 mg by nebulization every 6 (six) hours as needed for wheezing or shortness of breath.  11/11/18  Yes Shelda Pal, DO  allopurinol (ZYLOPRIM) 300 MG tablet Take 1 tablet (300 mg total) by mouth 2 (two) times daily. 02/15/19  Yes Shelda Pal, DO  aspirin EC 81 MG EC tablet Take 1 tablet (81 mg total) by mouth daily. 04/16/17  Yes Clegg, Amy D, NP  atorvastatin (LIPITOR) 80 MG tablet Take 1 tablet (80 mg total) by mouth daily. 08/26/16  Yes Shelda Pal, DO  carvedilol (COREG) 25 MG tablet Take 1 tablet (25 mg total) by mouth 2 (two) times daily with a meal. 06/10/19  Yes [place] Donnamarie Poag, DO  Colchicine 0.6 MG CAPS Take 0.6 mg by mouth 2 (two) times daily as needed (gout).  08/30/19  Yes [provider]  digoxin (LANOXIN) 0.125 MG tablet Take 1 tablet (125 mcg total) by mouth daily. 06/10/19  Yes [place] Eric J, DO  DULoxetine (CYMBALTA) 20 MG capsule TAKE 1 CAPSULE BY MOUTH EVERY DAY Patient taking differently: Take 20 mg by mouth daily.  06/07/19  Yes Shelda Pal, DO  gabapentin (NEURONTIN) 600 MG tablet Take 1 tablet (600 mg total) by mouth 2 (two) times daily. 05/12/19  Yes Shelda Pal, DO  glipiZIDE (GLUCOTROL) 5 MG tablet Take 1 tablet (5 mg total) by mouth daily before breakfast. 05/12/19  Yes Wendling, Crosby Oyster, DO  nortriptyline (PAMELOR) 10 MG capsule TAKE 1 CAPSULE (10 MG TOTAL) BY MOUTH AT BEDTIME. 07/25/19  Yes Patel, Donika K, DO  sacubitril-valsartan (ENTRESTO) 49-51 MG Take 1 tablet by mouth 2 (two) times daily. 06/10/19  Yes [place] Eric J, DO  torsemide (DEMADEX) 20 MG tablet Take 2 tablets (40 mg total) by mouth daily. 06/10/19  Yes [place] Eric J, DO  glucose blood test strip Use as instructed 08/26/16   Shelda Pal, DO  Lancets (FREESTYLE) lancets Use to check sugars daily 08/26/16   Shelda Pal, DO    Family History Family History  Problem Relation Age of Onset  .  Hypertension Mother   . Diabetes Mother   . Hyperlipidemia Mother   . Kidney failure Mother   . Emphysema Father     Social History Social History   Tobacco Use  . Smoking status: Former Smoker    Packs/day: 0.50    Years: 30.00    Pack years: 15.00    Quit date: 07/10/2016    Years since quitting: 3.1  . Smokeless tobacco: Never Used  Substance Use Topics  . Alcohol use: No  . Drug use: No     Allergies   Amitriptyline, Hydrocodone, and Tramadol   Review of Systems Review of Systems  Respiratory: Positive for shortness of breath.   Cardiovascular: Positive for leg swelling. Negative for chest pain.  All other systems reviewed and are negative.    Physical Exam Updated Vital Signs BP (!) 174/108 (BP Location: Left Arm)   Pulse 84   Temp 98.9 F (37.2 C) (Oral)  Resp 20   Ht 5\' 10"  (1.778 m)   Wt 84.4 kg   SpO2 98%   BMI 26.69 kg/m   Physical Exam Vitals signs and nursing note reviewed.  Constitutional:      General: He is not in acute distress.    Appearance: Normal appearance. He is well-developed.  HENT:     Head: Normocephalic and atraumatic.     Right Ear: Hearing normal.     Left Ear: Hearing normal.     Nose: Nose normal.  Eyes:     Conjunctiva/sclera: Conjunctivae normal.     Pupils: Pupils are equal, round, and reactive to light.  Neck:     Musculoskeletal: Normal range of motion and neck supple.  Cardiovascular:     Rate and Rhythm: Regular rhythm.     Heart sounds: S1 normal and S2 normal. No murmur. No friction rub. No gallop.   Pulmonary:     Effort: Pulmonary effort is normal. No respiratory distress.     Breath sounds: Examination of the right-lower field reveals rales. Examination of the left-lower field reveals rales. Rales present.  Chest:     Chest wall: No tenderness.  Abdominal:     General: Bowel sounds are normal.     Palpations: Abdomen is soft.     Tenderness: There is no abdominal tenderness. There is no guarding or  rebound. Negative signs include Murphy's sign and McBurney's sign.     Hernia: No hernia is present.  Musculoskeletal: Normal range of motion.     Right lower leg: 3+ Edema present.     Left lower leg: 3+ Edema present.  Skin:    General: Skin is warm and dry.     Findings: No rash.  Neurological:     Mental Status: He is alert and oriented to person, place, and time.     GCS: GCS eye subscore is 4. GCS verbal subscore is 5. GCS motor subscore is 6.     Cranial Nerves: No cranial nerve deficit.     Sensory: No sensory deficit.     Coordination: Coordination normal.  Psychiatric:        Speech: Speech normal.        Behavior: Behavior normal.        Thought Content: Thought content normal.      ED Treatments / Results  Labs (all labs ordered are listed, but only abnormal results are displayed) Labs Reviewed  CBC WITH DIFFERENTIAL/PLATELET - Abnormal; Notable for the following components:      Result Value   RBC 3.94 (*)    Hemoglobin 11.7 (*)    HCT 37.3 (*)    RDW 17.8 (*)    All other components within normal limits  COMPREHENSIVE METABOLIC PANEL - Abnormal; Notable for the following components:   Chloride 96 (*)    Glucose, Bld 143 (*)    BUN 30 (*)    Creatinine, Ser 1.56 (*)    Calcium 8.8 (*)    Total Protein 8.2 (*)    Albumin 3.0 (*)    Total Bilirubin 2.4 (*)    GFR calc non Af Amer 47 (*)    GFR calc Af Amer 54 (*)    All other components within normal limits  BRAIN NATRIURETIC PEPTIDE - Abnormal; Notable for the following components:   B Natriuretic Peptide 2,672.7 (*)    All other components within normal limits  TROPONIN I (HIGH SENSITIVITY) - Abnormal; Notable for the following components:  Troponin I (High Sensitivity) 52 (*)    All other components within normal limits  SARS CORONAVIRUS 2 (HOSPITAL ORDER, PERFORMED IN [facility] LAB)  TROPONIN I (HIGH SENSITIVITY)    EKG EKG Interpretation  Date/Time:  Sunday September 10 2019  02:42:35 EDT Ventricular Rate:  84 PR Interval:    QRS Duration: 169 QT Interval:  436 QTC Calculation: 516 R Axis:   -92 Text Interpretation:  Sinus rhythm Multiform ventricular premature complexes Nonspecific IVCD with LAD Confirmed by Orpah Greek [phone]) on 09/10/2019 3:33:26 AM   Radiology Dg Chest Port 1 View  Result Date: 09/10/2019 CLINICAL DATA:  Shortness of breath. EXAM: PORTABLE CHEST 1 VIEW COMPARISON:  08/26/2019 FINDINGS: Multi lead left-sided pacemaker in place. Post median sternotomy. Chronic cardiomegaly, unchanged from prior exam. No acute airspace disease, pulmonary edema, pleural fluid or pneumothorax. IMPRESSION: Post CABG with chronic cardiomegaly.  No acute abnormality. Electronically Signed   By: Keith Rake M.D.   On: 09/10/2019 03:17    Procedures Procedures (including critical care time)  Medications Ordered in ED Medications  furosemide (LASIX) injection 80 mg (has no administration in time range)     Initial Impression / Assessment and Plan / ED Course  I have reviewed the triage vital signs and the nursing notes.  Pertinent labs & imaging results that were available during my care of the patient were reviewed by me and considered in my medical decision making (see chart for details).        Patient presents to the emergency department for evaluation of shortness of breath, dyspnea on exertion, orthopnea with progressive swelling of his lower legs.  Patient has a known history of ischemic cardiomyopathy and ejection fraction of 20 to 25%.  He was hospitalized at [facility] two weeks ago with a similar picture, diuresed and discharged.  Symptoms have now reoccurred.  His BNP is markedly elevated compared to his baseline and is nearly double what it was when he was admitted 2 weeks ago.  On exam he has significant pitting edema of both lower extremities to the mid abdomen.  He will require repeat hospitalization for diuresis.   Final Clinical Impressions(s) / ED Diagnoses   Final diagnoses:  Acute on chronic combined systolic and diastolic congestive heart failure [facility])    ED Discharge Orders    None       Orpah Greek, MD 09/10/19 [id number]

## 2019-09-10 NOTE — ED Notes (Signed)
ED TO INPATIENT HANDOFF REPORT  Name/Age/Gender Patrick Stewart 63 y.o. male  Code Status Code Status History    Date Active Date Inactive Code Status Order ID Comments User Context   06/05/2019 0748 06/10/2019 1411 Full Code [id number]  Mendel Corning, MD Inpatient   07/16/2018 0414 07/17/2018 1859 Full Code [id number]  Vianne Bulls, MD Inpatient   10/21/2017 1110 10/22/2017 1457 Full Code [id number]  Constance Haw, MD Inpatient   07/28/2017 0837 07/28/2017 2353 Full Code [id number]  Rondel Jumbo, PA-C Inpatient   07/12/2017 2128 07/14/2017 1729 Full Code [id number]  Vianne Bulls, MD ED   04/11/2017 1157 04/15/2017 1902 Full Code [id number]  Sanda Klein, MD Inpatient   09/30/2016 0048 10/02/2016 1921 Full Code [id number]  Karmen Bongo, MD Inpatient   10/13/2015 0049 10/14/2015 1631 Full Code [id number]  Lavina Hamman, MD ED   Advance Care Planning Activity      Home/SNF/Other Home  Chief Complaint Right Leg pain  Level of Care/Admitting Diagnosis ED Disposition    ED Disposition Condition [facility] Area: [place] [[id number]]  Level of Care: Telemetry [5]  Admit to tele based on following criteria: Eval of Syncope  Covid Evaluation: Confirmed COVID Negative  Diagnosis: Acute systolic CHF (congestive heart failure), NYHA class 3 ([place]) [id number]  Admitting Physician: Durward Parcel  Attending Physician: Nita Sells [phone]  Estimated length of stay: 3 - 4 days  Certification:: I certify this patient will need inpatient services for at least 2 midnights  PT Class (Do Not Modify): Inpatient [101]  PT Acc Code (Do Not Modify): Private [1]       Medical History Past Medical History:  Diagnosis Date  . CAD (coronary artery disease) 2010   3v CABG  . CHF (congestive heart failure) ([place]) 01/26/2012  . CKD (chronic kidney disease) 02/19/2012  . COPD with chronic bronchitis ([place]) 08/26/2016  . Diabetes mellitus type 2,  uncontrolled ([place]) 01/26/2012  . Elevated liver function tests 01/26/2012  . Gout 01/26/2012  . Hx of CABG 2010   RCA Stent 2008, CABG x 2 [facility] 2010  . Hypercholesterolemia 07/08/2016  . Hypertension 09/04/2014  . Ischemic cardiomyopathy 11/15/2014  . Kidney stone 10/25/2015  . Lipid disorder 01/26/2012  . Noncompliance 10/18/2018  . NSTEMI (non-ST elevated myocardial infarction) ([place]) 07/08/2016  . ST elevation myocardial infarction (STEMI) ([place]) 2010  . Unstable angina ([facility]) 11/14/2014    Allergies Allergies  Allergen Reactions  . Amitriptyline Other (See Comments)    drowsy  . Hydrocodone Itching  . Tramadol Nausea Only    IV Location/Drains/Wounds Patient Lines/Drains/Airways Status   Active Line/Drains/Airways    None          Labs/Imaging Results for orders placed or performed during the hospital encounter of 09/10/19 (from the past 48 hour(s))  CBC with Differential/Platelet     Status: Abnormal   Collection Time: 09/10/19  2:37 AM  Result Value Ref Range   WBC 4.8 4.0 - 10.5 K/uL   RBC 3.94 (L) 4.22 - 5.81 MIL/uL   Hemoglobin 11.7 (L) 13.0 - 17.0 g/dL   HCT 37.3 (L) 39.0 - 52.0 %   MCV 94.7 80.0 - 100.0 fL   MCH 29.7 26.0 - 34.0 pg   MCHC 31.4 30.0 - 36.0 g/dL   RDW 17.8 (H) 11.5 - 15.5 %   Platelets 199 150 - 400 K/uL   nRBC 0.0 0.0 - 0.2 %   Neutrophils Relative %  60 %   Neutro Abs 2.9 1.7 - 7.7 K/uL   Lymphocytes Relative 18 %   Lymphs Abs 0.8 0.7 - 4.0 K/uL   Monocytes Relative 15 %   Monocytes Absolute 0.7 0.1 - 1.0 K/uL   Eosinophils Relative 6 %   Eosinophils Absolute 0.3 0.0 - 0.5 K/uL   Basophils Relative 1 %   Basophils Absolute 0.0 0.0 - 0.1 K/uL   Immature Granulocytes 0 %   Abs Immature Granulocytes 0.02 0.00 - 0.07 K/uL    Comment: Performed at [facility], [place] [address] [place], [place] [address]  Comprehensive metabolic panel     Status: Abnormal   Collection Time: 09/10/19  2:37 AM  Result Value Ref Range    Sodium 135 135 - 145 mmol/L   Potassium 3.6 3.5 - 5.1 mmol/L   Chloride 96 (L) 98 - 111 mmol/L   CO2 27 22 - 32 mmol/L   Glucose, Bld 143 (H) 70 - 99 mg/dL   BUN 30 (H) 8 - 23 mg/dL   Creatinine, Ser 1.56 (H) 0.61 - 1.24 mg/dL   Calcium 8.8 (L) 8.9 - 10.3 mg/dL   Total Protein 8.2 (H) 6.5 - 8.1 g/dL   Albumin 3.0 (L) 3.5 - 5.0 g/dL   AST 25 15 - 41 U/L   ALT 13 0 - 44 U/L   Alkaline Phosphatase 104 38 - 126 U/L   Total Bilirubin 2.4 (H) 0.3 - 1.2 mg/dL   GFR calc non Af Amer 47 (L) >60 mL/min   GFR calc Af Amer 54 (L) >60 mL/min   Anion gap 12 5 - 15    Comment: Performed at [facility], [place] [address] [place], [place] [address]  Brain natriuretic peptide     Status: Abnormal   Collection Time: 09/10/19  2:37 AM  Result Value Ref Range   B Natriuretic Peptide 2,672.7 (H) 0.0 - 100.0 pg/mL    Comment: Performed at [facility], [place] [address], [place], [place] [address]  Troponin I (High Sensitivity)     Status: Abnormal   Collection Time: 09/10/19  2:37 AM  Result Value Ref Range   Troponin I (High Sensitivity) 52 (H) <18 ng/L    Comment: (NOTE) Elevated high sensitivity troponin I (hsTnI) values and significant  changes across serial measurements may suggest ACS but many other  chronic and acute conditions are known to elevate hsTnI results.  Refer to the "Links" section for chest pain algorithms and additional  guidance. Performed at [facility], [place] [address] [place], [place] [address]   Troponin I (High Sensitivity)     Status: Abnormal   Collection Time: 09/10/19  4:45 AM  Result Value Ref Range   Troponin I (High Sensitivity) 53 (H) <18 ng/L    Comment: (NOTE) Elevated high sensitivity troponin I (hsTnI) values and significant  changes across serial measurements may suggest ACS but many other  chronic and acute conditions are known to elevate hsTnI results.  Refer to the "Links" section for chest pain  algorithms and additional  guidance. Performed at [facility], [place] [address], [place], [place] [address]   SARS Coronavirus 2 [facility] order, Performed in [facility] hospital lab) Nasopharyngeal Nasopharyngeal Swab     Status: None   Collection Time: 09/10/19  4:53 AM   Specimen: Nasopharyngeal Swab  Result Value Ref Range   SARS Coronavirus 2 NEGATIVE NEGATIVE    Comment: (NOTE) If result  is NEGATIVE SARS-CoV-2 target nucleic acids are NOT DETECTED. The SARS-CoV-2 RNA is generally detectable in upper and lower  respiratory specimens during the acute phase of infection. The lowest  concentration of SARS-CoV-2 viral copies this assay can detect is 250  copies / mL. A negative result does not preclude SARS-CoV-2 infection  and should not be used as the sole basis for treatment or other  patient management decisions.  A negative result may occur with  improper specimen collection / handling, submission of specimen other  than nasopharyngeal swab, presence of viral mutation(s) within the  areas targeted by this assay, and inadequate number of viral copies  (<250 copies / mL). A negative result must be combined with clinical  observations, patient history, and epidemiological information. If result is POSITIVE SARS-CoV-2 target nucleic acids are DETECTED. The SARS-CoV-2 RNA is generally detectable in upper and lower  respiratory specimens dur ing the acute phase of infection.  Positive  results are indicative of active infection with SARS-CoV-2.  Clinical  correlation with patient history and other diagnostic information is  necessary to determine patient infection status.  Positive results do  not rule out bacterial infection or co-infection with other viruses. If result is PRESUMPTIVE POSTIVE SARS-CoV-2 nucleic acids MAY BE PRESENT.   A presumptive positive result was obtained on the submitted specimen  and confirmed on repeat testing.  While 2019 novel  coronavirus  (SARS-CoV-2) nucleic acids may be present in the submitted sample  additional confirmatory testing may be necessary for epidemiological  and / or clinical management purposes  to differentiate between  SARS-CoV-2 and other Sarbecovirus currently known to infect humans.  If clinically indicated additional testing with an alternate test  methodology [phone]) is advised. The SARS-CoV-2 RNA is generally  detectable in upper and lower respiratory sp ecimens during the acute  phase of infection. The expected result is Negative. Fact Sheet for Patients:  StrictlyIdeas.no Fact Sheet for Healthcare Providers: BankingDealers.co.za This test is not yet approved or cleared by the [place] FDA and has been authorized for detection and/or diagnosis of SARS-CoV-2 by FDA under an Emergency Use Authorization (EUA).  This EUA will remain in effect (meaning this test can be used) for the duration of the COVID-19 declaration under Section 564(b)(1) of the Act, 21 U.S.C. section 360bbb-3(b)(1), unless the authorization is terminated or revoked sooner. Performed at [facility], [place] [address] [place], [place] [address]    Dg Chest Port 1 View  Result Date: 09/10/2019 CLINICAL DATA:  Shortness of breath. EXAM: PORTABLE CHEST 1 VIEW COMPARISON:  08/26/2019 FINDINGS: Multi lead left-sided pacemaker in place. Post median sternotomy. Chronic cardiomegaly, unchanged from prior exam. No acute airspace disease, pulmonary edema, pleural fluid or pneumothorax. IMPRESSION: Post CABG with chronic cardiomegaly.  No acute abnormality. Electronically Signed   By: Keith Rake M.D.   On: 09/10/2019 03:17   Dg Knee Complete 4 Views Right  Result Date: 09/10/2019 CLINICAL DATA:  Right knee buckled last night with lateral/anterior pain since. EXAM: RIGHT KNEE - COMPLETE 4+ VIEW COMPARISON:  06/12/2019 FINDINGS: There is mild  tricompartmental osteoarthritis worse over the patellofemoral joint. Small joint effusion. No evidence of fracture or dislocation. IMPRESSION: No acute findings. Mild osteoarthritis with small joint effusion. Electronically Signed   By: Marin Olp M.D.   On: 09/10/2019 09:30    Pending Labs [employer] (From admission, onward)    Start     Ordered   Signed and Held  HIV antibody (Routine  Testing)  Once,   R     Signed and Held   Signed and Held  Basic metabolic panel  Daily,   R     Signed and Held   Signed and Held  CBC  (enoxaparin (LOVENOX)    CrCl < 30 ml/min)  Once,   R    Comments: Baseline for enoxaparin therapy IF NOT ALREADY DRAWN.  Notify MD if PLT < 100 K.    Signed and Held   Signed and Held  Creatinine, serum  (enoxaparin (LOVENOX)    CrCl < 30 ml/min)  Once,   R    Comments: Baseline for enoxaparin therapy IF NOT ALREADY DRAWN.    Signed and Held   Signed and Held  Creatinine, serum  (enoxaparin (LOVENOX)    CrCl < 30 ml/min)  Weekly,   R    Comments: while on enoxaparin therapy.    Signed and Held   Signed and Held  Digoxin level  Tomorrow morning,   R     Signed and Held          Vitals/Pain Today's Vitals   09/10/19 0800 09/10/19 0846 09/10/19 0900 09/10/19 0903  BP: (!) 167/101 (!) 166/100 (!) 159/101   Pulse: 78 88 85   Resp: 17 16 20    Temp:    97.7 F (36.5 C)  TempSrc:    Oral  SpO2: 99% 100% 99%   Weight:      Height:      PainSc:    0-No pain    Isolation Precautions No active isolations  Medications Medications  traMADol (ULTRAM) tablet 50 mg (has no administration in time range)  furosemide (LASIX) injection 80 mg (80 mg Intravenous Given 09/10/19 0516)    Mobility walks

## 2019-09-10 NOTE — ED Notes (Signed)
X-ray at bedside

## 2019-09-10 NOTE — ED Notes (Signed)
Pt is reporting pain in both legs that is 8/10. Admitting MD made aware.

## 2019-09-10 NOTE — ED Notes (Signed)
WILL TRANSPORT PT TO 4W 1444-1. AAOX4. PT IN NO APPARENT DISTRESS WITH MODERATE PAIN. THE OPPORTUNITY TO ASK QUESTIONS WAS PROVIDED.

## 2019-09-10 NOTE — ED Triage Notes (Signed)
Per EMS: Pt coming from home with a c/o right leg swelling/pain that started 3 hours ago. Pt states he has chronic edema and rates this pain an 8/10.   Vitals BP 160 palpated HR 84 RR18 SPO2 95 Temp 97.2

## 2019-09-10 NOTE — Progress Notes (Signed)
Patient reports having allergy to tramadol and reports symptom as itching. Pt also reports allergy to hydrocodone with symptom of itching. Pt is not agreeable to taking this medication for pain and requests to have percocet. Pt made aware tramadol has been administered twice prior today. Will update allergy list andcontact provider for assistance.

## 2019-09-11 ENCOUNTER — Inpatient Hospital Stay (HOSPITAL_COMMUNITY): Payer: Medicare Other

## 2019-09-11 DIAGNOSIS — I5043 Acute on chronic combined systolic (congestive) and diastolic (congestive) heart failure: Secondary | ICD-10-CM

## 2019-09-11 DIAGNOSIS — R52 Pain, unspecified: Secondary | ICD-10-CM

## 2019-09-11 DIAGNOSIS — R609 Edema, unspecified: Secondary | ICD-10-CM

## 2019-09-11 DIAGNOSIS — I255 Ischemic cardiomyopathy: Secondary | ICD-10-CM

## 2019-09-11 LAB — DIGOXIN LEVEL: Digoxin Level: <0.2 ng/mL — ABNORMAL LOW (ref 0.8–2.0)

## 2019-09-11 LAB — BASIC METABOLIC PANEL
Anion gap: 9 (ref 5–15)
BUN: 30 mg/dL — ABNORMAL HIGH (ref 8–23)
CO2: 33 mmol/L — ABNORMAL HIGH (ref 22–32)
Calcium: 8.5 mg/dL — ABNORMAL LOW (ref 8.9–10.3)
Chloride: 93 mmol/L — ABNORMAL LOW (ref 98–111)
Creatinine, Ser: 1.67 mg/dL — ABNORMAL HIGH (ref 0.61–1.24)
GFR calc Af Amer: 50 mL/min — ABNORMAL LOW (ref >60)
GFR calc non Af Amer: 43 mL/min — ABNORMAL LOW (ref >60)
Glucose, Bld: 123 mg/dL — ABNORMAL HIGH (ref 70–99)
Potassium: 3.2 mmol/L — ABNORMAL LOW (ref 3.5–5.1)
Sodium: 135 mmol/L (ref 135–145)

## 2019-09-11 LAB — GLUCOSE, CAPILLARY
Glucose-Capillary: 113 mg/dL — ABNORMAL HIGH (ref 70–99)
Glucose-Capillary: 112 mg/dL — ABNORMAL HIGH (ref 70–99)
Glucose-Capillary: 166 mg/dL — ABNORMAL HIGH (ref 70–99)
Glucose-Capillary: 70 mg/dL (ref 70–99)
Glucose-Capillary: 178 mg/dL — ABNORMAL HIGH (ref 70–99)

## 2019-09-11 MED ORDER — LIDOCAINE HCL (PF) 1 % IJ SOLN
5.0000 mL | Freq: Once | INTRAMUSCULAR | Status: DC
  Filled 2019-09-11: qty 5

## 2019-09-11 MED ORDER — FUROSEMIDE 10 MG/ML IJ SOLN
40.0000 mg | Freq: Every day | INTRAMUSCULAR | Status: DC
  Administered 2019-09-11: 40 mg via INTRAVENOUS
  Filled 2019-09-11: qty 4

## 2019-09-11 MED ORDER — ISOSORB DINITRATE-HYDRALAZINE 20-37.5 MG PO TABS
2.0000 | ORAL_TABLET | Freq: Three times a day (TID) | ORAL | Status: DC
  Administered 2019-09-11 – 2019-09-13 (×7): 2 via ORAL
  Filled 2019-09-11 (×9): qty 2

## 2019-09-11 MED ORDER — POTASSIUM CHLORIDE CRYS ER 20 MEQ PO TBCR
40.0000 meq | EXTENDED_RELEASE_TABLET | Freq: Once | ORAL | Status: AC
  Administered 2019-09-11: 09:00:00 40 meq via ORAL
  Filled 2019-09-11: qty 2

## 2019-09-11 MED ORDER — METHYLPREDNISOLONE ACETATE 80 MG/ML IJ SUSP
80.0000 mg | Freq: Once | INTRAMUSCULAR | Status: DC
  Filled 2019-09-11: qty 1

## 2019-09-11 MED ORDER — INSULIN ASPART 100 UNIT/ML ~~LOC~~ SOLN
0.0000 [IU] | Freq: Three times a day (TID) | SUBCUTANEOUS | Status: DC
  Administered 2019-09-11 – 2019-09-12 (×4): 3 [IU] via SUBCUTANEOUS
  Administered 2019-09-13: 5 [IU] via SUBCUTANEOUS
  Administered 2019-09-13: 08:00:00 3 [IU] via SUBCUTANEOUS
  Administered 2019-09-13: 2 [IU] via SUBCUTANEOUS
  Administered 2019-09-14 (×2): 3 [IU] via SUBCUTANEOUS

## 2019-09-11 MED ORDER — FUROSEMIDE 10 MG/ML IJ SOLN
40.0000 mg | Freq: Two times a day (BID) | INTRAMUSCULAR | Status: DC
  Administered 2019-09-11 – 2019-09-13 (×4): 40 mg via INTRAVENOUS
  Filled 2019-09-11 (×4): qty 4

## 2019-09-11 MED ORDER — INSULIN ASPART 100 UNIT/ML ~~LOC~~ SOLN
0.0000 [IU] | Freq: Every day | SUBCUTANEOUS | Status: DC
  Administered 2019-09-12 – 2019-09-13 (×2): 2 [IU] via SUBCUTANEOUS

## 2019-09-11 NOTE — Progress Notes (Signed)
Will follow pt for [facility] needs, waiting for PT eval.

## 2019-09-11 NOTE — Progress Notes (Signed)
MD has placed order for [facility]. RN discussed this order and gave education to the Pt regarding Unna Boots Pt states "I don't want those things" RN again tried to educated but Pt is refusing. Will make MD aware.

## 2019-09-11 NOTE — Consult Note (Signed)
Reason for Consult: Right knee pain / swelling Referring Physician: Medicine  Patrick Stewart is an 63 y.o. male.  HPI:   Patient is a 63 yr old male in no acute distress.  Patient presents to the emergency department for evaluation of swelling and right knee/leg pain.   He was admitted at [facility] 3 weeks ago for congestive heart failure and release several days later.   Since he has has increased swelling.  Over the last couple of days he has noticed pain in the right knee, making it difficult to bear weight. Dr. Alvan Dame was consulted for evaluation of the right knee.   Past Medical History:  Diagnosis Date  . CAD (coronary artery disease) 2010   3v CABG  . CHF (congestive heart failure) ([place]) 01/26/2012  . CKD (chronic kidney disease) 02/19/2012  . COPD with chronic bronchitis ([place]) 08/26/2016  . Diabetes mellitus type 2, uncontrolled ([employer]) 01/26/2012  . Elevated liver function tests 01/26/2012  . Gout 01/26/2012  . Hx of CABG 2010   RCA Stent 2008, CABG x 2 [facility] 2010  . Hypercholesterolemia 07/08/2016  . Hypertension 09/04/2014  . Ischemic cardiomyopathy 11/15/2014  . Kidney stone 10/25/2015  . Lipid disorder 01/26/2012  . Noncompliance 10/18/2018  . NSTEMI (non-ST elevated myocardial infarction) ([place]) 07/08/2016  . ST elevation myocardial infarction (STEMI) ([place]) 2010  . Unstable angina ([place]) 11/14/2014    Past Surgical History:  Procedure Laterality Date  . BIV ICD INSERTION CRT-D N/A 10/21/2017   Procedure: BIV ICD INSERTION CRT-D;  Surgeon: Constance Haw, MD;  Location: [facility];  Service: Cardiovascular;  Laterality: N/A;  . CORONARY ARTERY BYPASS GRAFT  2010  . KNEE SURGERY    . RIGHT/LEFT HEART CATH AND CORONARY ANGIOGRAPHY N/A 04/15/2017   Procedure: Right/Left Heart Cath and Coronary Angiography;  Surgeon: Larey Dresser, MD;  Location: [facility];  Service: Cardiovascular;  Laterality: N/A;  . ROTATOR CUFF REPAIR    . WRIST SURGERY       Family History  Problem Relation Age of Onset  . Hypertension Mother   . Diabetes Mother   . Hyperlipidemia Mother   . Kidney failure Mother   . Emphysema Father     Social History:  reports that he quit smoking about 3 years ago. He has a 15.00 pack-year smoking history. He has never used smokeless tobacco. He reports that he does not drink alcohol or use drugs.  Allergies:  Allergies  Allergen Reactions  . Amitriptyline Other (See Comments)    drowsy  . Hydrocodone Itching  . Tramadol Nausea Only     Results for orders placed or performed during the hospital encounter of 09/10/19 (from the past 48 hour(s))  CBC with Differential/Platelet     Status: Abnormal   Collection Time: 09/10/19  2:37 AM  Result Value Ref Range   WBC 4.8 4.0 - 10.5 K/uL   RBC 3.94 (L) 4.22 - 5.81 MIL/uL   Hemoglobin 11.7 (L) 13.0 - 17.0 g/dL   HCT 37.3 (L) 39.0 - 52.0 %   MCV 94.7 80.0 - 100.0 fL   MCH 29.7 26.0 - 34.0 pg   MCHC 31.4 30.0 - 36.0 g/dL   RDW 17.8 (H) 11.5 - 15.5 %   Platelets 199 150 - 400 K/uL   nRBC 0.0 0.0 - 0.2 %   Neutrophils Relative % 60 %   Neutro Abs 2.9 1.7 - 7.7 K/uL   Lymphocytes Relative 18 %  Lymphs Abs 0.8 0.7 - 4.0 K/uL   Monocytes Relative 15 %   Monocytes Absolute 0.7 0.1 - 1.0 K/uL   Eosinophils Relative 6 %   Eosinophils Absolute 0.3 0.0 - 0.5 K/uL   Basophils Relative 1 %   Basophils Absolute 0.0 0.0 - 0.1 K/uL   Immature Granulocytes 0 %   Abs Immature Granulocytes 0.02 0.00 - 0.07 K/uL    Comment: Performed at [facility], [place] [address] [place], [place] [address]  Comprehensive metabolic panel     Status: Abnormal   Collection Time: 09/10/19  2:37 AM  Result Value Ref Range   Sodium 135 135 - 145 mmol/L   Potassium 3.6 3.5 - 5.1 mmol/L   Chloride 96 (L) 98 - 111 mmol/L   CO2 27 22 - 32 mmol/L   Glucose, Bld 143 (H) 70 - 99 mg/dL   BUN 30 (H) 8 - 23 mg/dL   Creatinine, Ser 1.56 (H) 0.61 - 1.24 mg/dL   Calcium 8.8 (L)  8.9 - 10.3 mg/dL   Total Protein 8.2 (H) 6.5 - 8.1 g/dL   Albumin 3.0 (L) 3.5 - 5.0 g/dL   AST 25 15 - 41 U/L   ALT 13 0 - 44 U/L   Alkaline Phosphatase 104 38 - 126 U/L   Total Bilirubin 2.4 (H) 0.3 - 1.2 mg/dL   GFR calc non Af Amer 47 (L) >60 mL/min   GFR calc Af Amer 54 (L) >60 mL/min   Anion gap 12 5 - 15    Comment: Performed at [facility], [place] [address], [place], [place] [address]  Brain natriuretic peptide     Status: Abnormal   Collection Time: 09/10/19  2:37 AM  Result Value Ref Range   B Natriuretic Peptide 2,672.7 (H) 0.0 - 100.0 pg/mL    Comment: Performed at [facility], [place] [address] [place], [place] [address]  Troponin I (High Sensitivity)     Status: Abnormal   Collection Time: 09/10/19  2:37 AM  Result Value Ref Range   Troponin I (High Sensitivity) 52 (H) <18 ng/L    Comment: (NOTE) Elevated high sensitivity troponin I (hsTnI) values and significant  changes across serial measurements may suggest ACS but many other  chronic and acute conditions are known to elevate hsTnI results.  Refer to the "Links" section for chest pain algorithms and additional  guidance. Performed at [facility], [place] [address], [place], [place] [address]   Troponin I (High Sensitivity)     Status: Abnormal   Collection Time: 09/10/19  4:45 AM  Result Value Ref Range   Troponin I (High Sensitivity) 53 (H) <18 ng/L    Comment: (NOTE) Elevated high sensitivity troponin I (hsTnI) values and significant  changes across serial measurements may suggest ACS but many other  chronic and acute conditions are known to elevate hsTnI results.  Refer to the "Links" section for chest pain algorithms and additional  guidance. Performed at [facility], [place] [address] [place], [place] [address]   SARS Coronavirus 2 [facility] order, Performed in [facility] hospital lab) Nasopharyngeal Nasopharyngeal Swab     Status:  None   Collection Time: 09/10/19  4:53 AM   Specimen: Nasopharyngeal Swab  Result Value Ref Range   SARS Coronavirus 2 NEGATIVE NEGATIVE    Comment: (NOTE) If result is NEGATIVE SARS-CoV-2 target nucleic acids are NOT DETECTED. The SARS-CoV-2 RNA is generally detectable in upper and lower  respiratory specimens during the acute phase of infection. The lowest  concentration of SARS-CoV-2 viral copies this assay can detect is 250  copies / mL. A negative result does not preclude SARS-CoV-2 infection  and should not be used as the sole basis for treatment or other  patient management decisions.  A negative result may occur with  improper specimen collection / handling, submission of specimen other  than nasopharyngeal swab, presence of viral mutation(s) within the  areas targeted by this assay, and inadequate number of viral copies  (<250 copies / mL). A negative result must be combined with clinical  observations, patient history, and epidemiological information. If result is POSITIVE SARS-CoV-2 target nucleic acids are DETECTED. The SARS-CoV-2 RNA is generally detectable in upper and lower  respiratory specimens dur ing the acute phase of infection.  Positive  results are indicative of active infection with SARS-CoV-2.  Clinical  correlation with patient history and other diagnostic information is  necessary to determine patient infection status.  Positive results do  not rule out bacterial infection or co-infection with other viruses. If result is PRESUMPTIVE POSTIVE SARS-CoV-2 nucleic acids MAY BE PRESENT.   A presumptive positive result was obtained on the submitted specimen  and confirmed on repeat testing.  While 2019 novel coronavirus  (SARS-CoV-2) nucleic acids may be present in the submitted sample  additional confirmatory testing may be necessary for epidemiological  and / or clinical management purposes  to differentiate between  SARS-CoV-2 and other Sarbecovirus currently  known to infect humans.  If clinically indicated additional testing with an alternate test  methodology [phone]) is advised. The SARS-CoV-2 RNA is generally  detectable in upper and lower respiratory sp ecimens during the acute  phase of infection. The expected result is Negative. Fact Sheet for Patients:  StrictlyIdeas.no Fact Sheet for Healthcare Providers: BankingDealers.co.za This test is not yet approved or cleared by the [place] FDA and has been authorized for detection and/or diagnosis of SARS-CoV-2 by FDA under an Emergency Use Authorization (EUA).  This EUA will remain in effect (meaning this test can be used) for the duration of the COVID-19 declaration under Section 564(b)(1) of the Act, 21 U.S.C. section 360bbb-3(b)(1), unless the authorization is terminated or revoked sooner. Performed at [facility], [place] [address], [place], [place] [address]   Glucose, capillary     Status: Abnormal   Collection Time: 09/10/19 10:29 AM  Result Value Ref Range   Glucose-Capillary 131 (H) 70 - 99 mg/dL   Comment 1 Notify RN   Glucose, capillary     Status: Abnormal   Collection Time: 09/10/19  4:59 PM  Result Value Ref Range   Glucose-Capillary 138 (H) 70 - 99 mg/dL   Comment 1 Notify RN   Glucose, capillary     Status: Abnormal   Collection Time: 09/10/19 10:28 PM  Result Value Ref Range   Glucose-Capillary 167 (H) 70 - 99 mg/dL  Basic metabolic panel     Status: Abnormal   Collection Time: 09/11/19  5:09 AM  Result Value Ref Range   Sodium 135 135 - 145 mmol/L   Potassium 3.2 (L) 3.5 - 5.1 mmol/L   Chloride 93 (L) 98 - 111 mmol/L   CO2 33 (H) 22 - 32 mmol/L   Glucose, Bld 123 (H) 70 - 99 mg/dL   BUN 30 (H) 8 - 23 mg/dL   Creatinine, Ser 1.67 (H) 0.61 - 1.24 mg/dL   Calcium 8.5 (L) 8.9 - 10.3 mg/dL  GFR calc non Af Amer 43 (L) >60 mL/min   GFR calc Af Amer 50 (L) >60 mL/min   Anion gap 9 5 - 15     Comment: Performed at [facility], [place] [address] [place], [place] [address]  Digoxin level     Status: Abnormal   Collection Time: 09/11/19  5:09 AM  Result Value Ref Range   Digoxin Level <0.2 (L) 0.8 - 2.0 ng/mL    Comment: Performed at [facility], [place] [address] [place], [place] [address]  Glucose, capillary     Status: Abnormal   Collection Time: 09/11/19  7:59 AM  Result Value Ref Range   Glucose-Capillary 113 (H) 70 - 99 mg/dL  Glucose, capillary     Status: Abnormal   Collection Time: 09/11/19  8:38 AM  Result Value Ref Range   Glucose-Capillary 112 (H) 70 - 99 mg/dL  Glucose, capillary     Status: Abnormal   Collection Time: 09/11/19 11:23 AM  Result Value Ref Range   Glucose-Capillary 166 (H) 70 - 99 mg/dL    Dg Chest Port 1 View  Result Date: 09/10/2019 CLINICAL DATA:  Shortness of breath. EXAM: PORTABLE CHEST 1 VIEW COMPARISON:  08/26/2019 FINDINGS: Multi lead left-sided pacemaker in place. Post median sternotomy. Chronic cardiomegaly, unchanged from prior exam. No acute airspace disease, pulmonary edema, pleural fluid or pneumothorax. IMPRESSION: Post CABG with chronic cardiomegaly.  No acute abnormality. Electronically Signed   By: Keith Rake M.D.   On: 09/10/2019 03:17   Dg Knee Complete 4 Views Right  Result Date: 09/10/2019 CLINICAL DATA:  Right knee buckled last night with lateral/anterior pain since. EXAM: RIGHT KNEE - COMPLETE 4+ VIEW COMPARISON:  06/12/2019 FINDINGS: There is mild tricompartmental osteoarthritis worse over the patellofemoral joint. Small joint effusion. No evidence of fracture or dislocation. IMPRESSION: No acute findings. Mild osteoarthritis with small joint effusion. Electronically Signed   By: Marin Olp M.D.   On: 09/10/2019 09:30   Vas [place] Lower Extremity Venous (dvt)  Result Date: 09/11/2019  Lower Venous Study Indications: Edema, and Pain.  Risk Factors: None identified. Limitations: Poor  ultrasound/tissue interface and patient positioning, patient pain tolerance. Comparison Study: No prior studies. Performing Technologist: Oliver Hum RVT  Examination Guidelines: A complete evaluation includes B-mode imaging, spectral Doppler, color Doppler, and power Doppler as needed of all accessible portions of each vessel. Bilateral testing is considered an integral part of a complete examination. Limited examinations for reoccurring indications may be performed as noted.  +---------+---------------+---------+-----------+----------+--------------+ RIGHT    CompressibilityPhasicitySpontaneityPropertiesThrombus Aging +---------+---------------+---------+-----------+----------+--------------+ CFV      Full           Yes      Yes                                 +---------+---------------+---------+-----------+----------+--------------+ SFJ      Full                                                        +---------+---------------+---------+-----------+----------+--------------+ FV Prox  Full                                                        +---------+---------------+---------+-----------+----------+--------------+  FV Mid   Full                                                        +---------+---------------+---------+-----------+----------+--------------+ FV DistalFull                                                        +---------+---------------+---------+-----------+----------+--------------+ PFV      Full                                                        +---------+---------------+---------+-----------+----------+--------------+ POP      Full           Yes      Yes                                 +---------+---------------+---------+-----------+----------+--------------+ PTV      Full                                                        +---------+---------------+---------+-----------+----------+--------------+ PERO     Full                                                         +---------+---------------+---------+-----------+----------+--------------+   +---------+---------------+---------+-----------+----------+--------------+ LEFT     CompressibilityPhasicitySpontaneityPropertiesThrombus Aging +---------+---------------+---------+-----------+----------+--------------+ CFV      Full           Yes      Yes                                 +---------+---------------+---------+-----------+----------+--------------+ SFJ      Full                                                        +---------+---------------+---------+-----------+----------+--------------+ FV Prox  Full                                                        +---------+---------------+---------+-----------+----------+--------------+ FV Mid   Full                                                        +---------+---------------+---------+-----------+----------+--------------+  FV DistalFull                                                        +---------+---------------+---------+-----------+----------+--------------+ PFV      Full                                                        +---------+---------------+---------+-----------+----------+--------------+ POP      Full           Yes      Yes                                 +---------+---------------+---------+-----------+----------+--------------+ PTV      Full                                                        +---------+---------------+---------+-----------+----------+--------------+ PERO     Full                                                        +---------+---------------+---------+-----------+----------+--------------+     Summary: Right: There is no evidence of deep vein thrombosis in the lower extremity. However, portions of this examination were limited- see technologist comments above. No cystic structure found in the popliteal  fossa. Left: There is no evidence of deep vein thrombosis in the lower extremity. However, portions of this examination were limited- see technologist comments above. No cystic structure found in the popliteal fossa.  *See table(s) above for measurements and observations.    Preliminary     Review of Systems  Constitutional: Negative.   HENT: Negative.   Eyes: Negative.   Respiratory: Positive for shortness of breath.   Gastrointestinal: Negative.   Genitourinary: Negative.   Musculoskeletal: Positive for joint pain.  Skin: Negative.   Neurological: Negative.   Endo/Heme/Allergies: Negative.   Psychiatric/Behavioral: Negative.    Blood pressure (!) 147/92, pulse 74, temperature 98.2 F (36.8 C), temperature source Oral, resp. rate 14, height 5\' 10"  (1.778 m), weight 83.8 kg, SpO2 99 %. Physical Exam  Constitutional: He is oriented to person, place, and time. He appears well-developed.  HENT:  Head: Normocephalic.  Eyes: Pupils are equal, round, and reactive to light.  Neck: Neck supple. No JVD present. No tracheal deviation present. No thyromegaly present.  Cardiovascular: Normal rate, regular rhythm and intact distal pulses.  Respiratory: Effort normal and breath sounds normal. No respiratory distress. He has no wheezes.  GI: Soft. There is no abdominal tenderness. There is no guarding.  Musculoskeletal:     Right knee: He exhibits decreased range of motion, swelling, effusion and bony tenderness. He exhibits no deformity, no laceration and no erythema. Tenderness found.  Lymphadenopathy:    He has no cervical adenopathy.  Neurological: He is alert and oriented  to person, place, and time.  Skin: Skin is warm and dry.  Psychiatric: He has a normal mood and affect.    Assessment/Plan: 1.  Right knee pain 2.  Right knee swelling 3.  Right knee OA   Plan: I have discussed various options with the patient after reviewing his history, physical exam and x-rays.  He does wish to  proceed with a aspiration and injection of the right knee. Risks, benefit and expectations were discussed with the patient, he understood and wished to proceed.  After the right knee was prepped and cleaned with superolateral area was injected with 3 cc of 1% lidocaine.  This was allowed to set up and then the area was cleaned again.  An 18 gauge needle was used to aspirate 25cc of normal appearing joint fluid, no signs of infection.  The needle was kept in place and the knee was injected with 80 mg of DepoMedrol and 2 cc of 1% lidocaine.  Patient tolerated the procedure well without complications or issues.  Patient states that the knee was already feeling better soon after the aspiration/injection. I have instructed for him to use rest, ice compression and elevation to help the swelling in the knee.  He already has a compression sleeve on the knee and this was put back in place prior to leaving. He has significant OA changes in the right knee and should follow up in the clinic in an outpatient setting. Please let us know if there are any further concerns.       Patrick Stewart Ayaansh Smail 09/11/2019, 1:08 PM

## 2019-09-11 NOTE — Consult Note (Addendum)
Cardiology Consultation:   Patient ID: Patrick Stewart MRN: [id number]; DOB: October 21, 1956  Admit date: 09/10/2019 Date of Consult: 09/11/2019  Primary Care Provider: Shelda Pal, DO Primary Cardiologist: Loralie Champagne, MD  Primary Electrophysiologist:  Will Meredith Leeds, MD   Patient Profile:   Patrick Stewart is a 63 y.o. male with a hx of chronic systolic and diastolic heart failure, ICD in place (Medtronic), CAD s/p CABG (2010), DM, HTN, HLD, moderate pulmonary HTN, gout, and CKD stage III who is being seen today for the evaluation of CHF exacerbation at the request of Dr. Posey Pronto.  History of Present Illness:   [name] has a history of CAD s/p CABG x 2 (LIMA-LAD, SVG-OM in 2010), ischemic cardiomyopathy, chronic systolic and diastolic heart failure, COPD, DM, gout, former smoker, HTN, HLD, and pulmonary hypertension. His EF has been as low as 10-15% in 2018. Right and left heart cath 03/2017 with elevated LVEDP and marginal cardiac output, stable CAD with patent grafts and no intervention. BiV ICD (CRT-D) 10/21/17 (medtronic) for low EF and CAD. He recovered some function with EF of 30-35% in 2019. However, most recent echo 05/2019 with EF of 0000000, chronic diastolic heart failure, moderately dilated left atrium, and moderate AR.  Inferior and inferoseptal akinesis, the other wall segments are hypokinetic. He was hospitalized 05/2019 with CHF exacerbation. Prior to this hospitalization, his 03/2019 interrogation showed elevated volume status, unclear if he increased his torsemide as directed. While hospitalized, he was diuresed and medications optimized prior to discharge on 06/10/19. Med rec at discharge lists coreg, digoxin, entresto, and torsemide. Arlyce Harman was D/C'ed. He reported back to the ER 06/12/19 for fall on knee. He was not seen by our clinic for OP follow up.   He presented to [facility] 07/2019 for knee pain and volume overload. He was diuresed with lasix drip 6 lbs and discharged on  40 mg torsemide (home dose). Med rec is confusing - spironolactone 25 mg daily restarted and it appears entresto was stopped and losartan added. Today, pt reports taking entresto and digoxin. He was also placed on allopurinol for elevated uric acid and suspected gout flare. I confirmed with his pharmacy that entresto was placed on hold in June and losartan 25 mg started.  He presented to [facility]  09[facility]10/19 with right knee pain and SOB for the past 5 days. He reported significant DOE and orthopnea along with weight gain, abdominal fullness, and lower extremity swelling. He reported a buckle in his knee and heard a crackle and pop.  He was admitted and cardiology consulted.   Its unclear from epic records, pt report, and care everywhere what his medication regimen has been. He states that he stopped urinating in August prior to that admission. When he was discharged, he states he was only voiding about twice per day, which is not his normal. He has received 80 mg IV lasix x 1 so far and he feels much better. He is scheduled for 40 mg IV lasix daily.  Digoxin level was < 0.02 - question compliance BNP 2672 in the setting of CKD HS troponin 52 --> 53   Heart Pathway Score:     Past Medical History:  Diagnosis Date  . CAD (coronary artery disease) 2010   3v CABG  . CHF (congestive heart failure) ([place]) 01/26/2012  . CKD (chronic kidney disease) 02/19/2012  . COPD with chronic bronchitis ([place]) 08/26/2016  . Diabetes mellitus type 2, uncontrolled ([place]) 01/26/2012  . Elevated liver function tests 01/26/2012  .  Gout 01/26/2012  . Hx of CABG 2010   RCA Stent 2008, CABG x 2 [facility] 2010  . Hypercholesterolemia 07/08/2016  . Hypertension 09/04/2014  . Ischemic cardiomyopathy 11/15/2014  . Kidney stone 10/25/2015  . Lipid disorder 01/26/2012  . Noncompliance 10/18/2018  . NSTEMI (non-ST elevated myocardial infarction) ([place]) 07/08/2016  . ST elevation myocardial infarction (STEMI) ([place]) 2010  . Unstable  angina ([place]) 11/14/2014    Past Surgical History:  Procedure Laterality Date  . BIV ICD INSERTION CRT-D N/A 10/21/2017   Procedure: BIV ICD INSERTION CRT-D;  Surgeon: Constance Haw, MD;  Location: [facility];  Service: Cardiovascular;  Laterality: N/A;  . CORONARY ARTERY BYPASS GRAFT  2010  . KNEE SURGERY    . RIGHT/LEFT HEART CATH AND CORONARY ANGIOGRAPHY N/A 04/15/2017   Procedure: Right/Left Heart Cath and Coronary Angiography;  Surgeon: Larey Dresser, MD;  Location: [facility];  Service: Cardiovascular;  Laterality: N/A;  . ROTATOR CUFF REPAIR    . WRIST SURGERY       Home Medications:  Prior to Admission medications   Medication Sig Start Date End Date Taking? Authorizing Provider  albuterol (PROVENTIL HFA;VENTOLIN HFA) 108 (90 Base) MCG/ACT inhaler Inhale 2 puffs into the lungs every 6 (six) hours as needed for wheezing or shortness of breath.   Yes [provider]  albuterol (PROVENTIL) (2.5 MG/3ML) 0.083% nebulizer solution INHALE CONTENTS OF 1 VIAL VIA NEBULIZER EVERY 6 HOURS AS NEEDED FOR WHEEZING/SHORTNESS OF BREATH Patient taking differently: Take 2.5 mg by nebulization every 6 (six) hours as needed for wheezing or shortness of breath.  11/11/18  Yes Shelda Pal, DO  allopurinol (ZYLOPRIM) 300 MG tablet Take 1 tablet (300 mg total) by mouth 2 (two) times daily. 02/15/19  Yes Shelda Pal, DO  aspirin EC 81 MG EC tablet Take 1 tablet (81 mg total) by mouth daily. 04/16/17  Yes Clegg, Amy D, NP  atorvastatin (LIPITOR) 80 MG tablet Take 1 tablet (80 mg total) by mouth daily. 08/26/16  Yes Shelda Pal, DO  carvedilol (COREG) 25 MG tablet Take 1 tablet (25 mg total) by mouth 2 (two) times daily with a meal. 06/10/19  Yes [place] Donnamarie Poag, DO  Colchicine 0.6 MG CAPS Take 0.6 mg by mouth 2 (two) times daily as needed (gout).  08/30/19  Yes [provider]  digoxin (LANOXIN) 0.125 MG tablet Take 1 tablet (125 mcg  total) by mouth daily. 06/10/19  Yes [place] Eric J, DO  DULoxetine (CYMBALTA) 20 MG capsule TAKE 1 CAPSULE BY MOUTH EVERY DAY Patient taking differently: Take 20 mg by mouth daily.  06/07/19  Yes Shelda Pal, DO  gabapentin (NEURONTIN) 600 MG tablet Take 1 tablet (600 mg total) by mouth 2 (two) times daily. 05/12/19  Yes Shelda Pal, DO  glipiZIDE (GLUCOTROL) 5 MG tablet Take 1 tablet (5 mg total) by mouth daily before breakfast. 05/12/19  Yes Wendling, Crosby Oyster, DO  nortriptyline (PAMELOR) 10 MG capsule TAKE 1 CAPSULE (10 MG TOTAL) BY MOUTH AT BEDTIME. 07/25/19  Yes Patel, Donika K, DO  sacubitril-valsartan (ENTRESTO) 49-51 MG Take 1 tablet by mouth 2 (two) times daily. 06/10/19  Yes [place] Eric J, DO  torsemide (DEMADEX) 20 MG tablet Take 2 tablets (40 mg total) by mouth daily. 06/10/19  Yes [place] Donnamarie Poag, DO  glucose blood test strip Use as instructed 08/26/16   Shelda Pal, DO  Lancets (FREESTYLE) lancets Use to check sugars daily 08/26/16  Shelda Pal, DO    Inpatient Medications: Scheduled Meds: . aspirin EC  81 mg Oral Daily  . atorvastatin  80 mg Oral Daily  . carvedilol  12.5 mg Oral BID WC  . colchicine  0.6 mg Oral BID  . digoxin  125 mcg Oral Daily  . DULoxetine  20 mg Oral Daily  . enoxaparin (LOVENOX) injection  40 mg Subcutaneous Daily  . furosemide  40 mg Intravenous Daily  . gabapentin  600 mg Oral BID  . influenza vac split quadrivalent PF  0.5 mL Intramuscular Tomorrow-1000  . insulin aspart  0-15 Units Subcutaneous TID WC  . insulin aspart  0-5 Units Subcutaneous QHS  . lidocaine  2 patch Transdermal Q24H  . nortriptyline  10 mg Oral QHS  . pneumococcal 23 valent vaccine  0.5 mL Intramuscular Tomorrow-1000  . potassium chloride  40 mEq Oral Once  . sacubitril-valsartan  1 tablet Oral BID  . sodium chloride flush  3 mL Intravenous Q12H   Continuous Infusions: . sodium chloride     PRN Meds: sodium chloride,  acetaminophen, albuterol, ondansetron (ZOFRAN) IV, oxyCODONE-acetaminophen, sodium chloride flush  Allergies:    Allergies  Allergen Reactions  . Amitriptyline Other (See Comments)    drowsy  . Hydrocodone Itching  . Tramadol Nausea Only    Social History:   Social History   Socioeconomic History  . Marital status: Married    Spouse name: Not on file  . Number of children: 3  . Years of education: Not on file  . Highest education level: Not on file  Occupational History  . Occupation: disabled  Social Needs  . Financial resource strain: Not hard at all  . Food insecurity    Worry: Patient refused    Inability: Patient refused  . Transportation needs    Medical: Patient refused    Non-medical: Patient refused  Tobacco Use  . Smoking status: Former Smoker    Packs/day: 0.50    Years: 30.00    Pack years: 15.00    Quit date: 07/10/2016    Years since quitting: 3.1  . Smokeless tobacco: Never Used  Substance and Sexual Activity  . Alcohol use: No  . Drug use: No  . Sexual activity: Yes  Lifestyle  . Physical activity    Days per week: Patient refused    Minutes per session: Patient refused  . Stress: Not at all  Relationships  . Social Herbalist on phone: Patient refused    Gets together: Patient refused    Attends religious service: Patient refused    Active member of club or organization: Patient refused    Attends meetings of clubs or organizations: Patient refused    Relationship status: Patient refused  . Intimate partner violence    Fear of current or ex partner: Patient refused    Emotionally abused: Patient refused    Physically abused: Patient refused    Forced sexual activity: Patient refused  Other Topics Concern  . Not on file  Social History Narrative   Right handed   Lives in a single story home with wife   Disabled since 2010 for CAD, previously worked at [employer].     Family History:    Family History  Problem Relation  Age of Onset  . Hypertension Mother   . Diabetes Mother   . Hyperlipidemia Mother   . Kidney failure Mother   . Emphysema Father      ROS:  Please see the history of present illness.   All other ROS reviewed and negative.     Physical Exam/Data:   Vitals:   09/10/19 1018 09/10/19 1520 09/10/19 2233 09/11/19 0500  BP: (!) 175/105 (!) 160/110 (!) 147/92   Pulse: 85 88 72   Resp: 14 16 14    Temp: 97.7 F (36.5 C) 98.3 F (36.8 C) 98.2 F (36.8 C)   TempSrc: Oral Oral Oral   SpO2: 100% 100% 99%   Weight:  85.4 kg  83.8 kg  Height:        Intake/Output Summary (Last 24 hours) at 09/11/2019 0747 Last data filed at 09/11/2019 0200 Gross per 24 hour  Intake 138 ml  Output 2000 ml  Net -1862 ml   Last 3 Weights 09/11/2019 09/10/2019 09/10/2019  Weight (lbs) 184 lb 11.9 oz 188 lb 4.4 oz 186 lb  Weight (kg) 83.8 kg 85.4 kg 84.369 kg     Body mass index is 26.51 kg/m.  General:  Well nourished, well developed, in no acute distress HEENT: normal Neck: + JVD Vascular: No carotid bruits Cardiac:  normal S1, S2; RRR; no murmur  Lungs:  Respirations unlabored, crackles in bases Abd: soft, nontender, no hepatomegaly  Ext: 1+ B LE edema Musculoskeletal:  No deformities, BUE and BLE strength normal and equal Skin: warm and dry  Neuro:  CNs 2-12 intact, no focal abnormalities noted Psych:  Normal affect   EKG:  The EKG was personally reviewed and demonstrates:  Sinus rhythm HR 84, PVCs Telemetry:  Telemetry was personally reviewed and demonstrates:  V-paced rhythm   Relevant CV Studies:  Echocardiogram 06/05/2019 IMPRESSIONS 1. The left ventricle has severely reduced systolic function, with an ejection fraction of 20-25%. The cavity size was mildly dilated. There is mildly increased left ventricular wall thickness. Left ventricular diastolic Doppler parameters are  consistent with pseudonormalization. Inferior and inferoseptal akinesis, the other wall segments are hypokinetic.  2. The right ventricle has moderately reduced systolic function. The cavity was normal. There is no increase in right ventricular wall thickness. 3. Left atrial size was moderately dilated. 4. Right atrial size was mildly dilated. 5. Trivial pericardial effusion is present. 6. There is a pleural effusion present. 7. The aortic valve is tricuspid. Mild calcification of the aortic valve. Aortic valve regurgitation is moderate by color flow Doppler. No stenosis of the aortic valve. 8. The aortic root is normal in size and structure. 9. The inferior vena cava was dilated in size with <50% respiratory variability. PA systolic pressure 60 mmHg. 10. No evidence of mitral valve stenosis. Trivial mitral regurgitation.  [facility] 04/15/2017  Left Main  30% stenosis.  Left Anterior Descending  90% mid LAD stenosis. Patent LIMA-LAD.  Left Circumflex  Moderate high OM1 with luminal irregularities. Small caliber distal LCx with moderate diffuse disease. 70-80% stenosis proximally in moderate OM2. Patent SVG-OM2.  Right Coronary Artery  30% proximal and mid RCA stenoses.   RA 9 RV 53/11 PA 52/17, mean 35 PCWP mean 14 LV 133/30 AO 136/86 Oxygen saturations: PA 60% AO 97% Cardiac Output (Fick) 4.12  Cardiac Index (Fick) 1.94   Laboratory Data:  High Sensitivity Troponin:   Recent Labs  Lab 09/10/19 0237 09/10/19 0445  TROPONINIHS 52* 53*     Chemistry Recent Labs  Lab 09/10/19 0237 09/11/19 0509  NA 135 135  K 3.6 3.2*  CL 96* 93*  CO2 27 33*  GLUCOSE 143* 123*  BUN 30* 30*  CREATININE 1.56* 1.67*  CALCIUM  8.8* 8.5*  GFRNONAA 47* 43*  GFRAA 54* 50*  ANIONGAP 12 9    Recent Labs  Lab 09/10/19 0237  PROT 8.2*  ALBUMIN 3.0*  AST 25  ALT 13  ALKPHOS 104  BILITOT 2.4*   Hematology Recent Labs  Lab 09/10/19 0237  WBC 4.8  RBC 3.94*  HGB 11.7*  HCT 37.3*  MCV 94.7  MCH 29.7  MCHC 31.4  RDW 17.8*  PLT 199   BNP Recent Labs  Lab 09/10/19 0237  BNP  2,672.7*    DDimer No results for input(s): DDIMER in the last 168 hours.   Radiology/Studies:  Dg Chest Port 1 View  Result Date: 09/10/2019 CLINICAL DATA:  Shortness of breath. EXAM: PORTABLE CHEST 1 VIEW COMPARISON:  08/26/2019 FINDINGS: Multi lead left-sided pacemaker in place. Post median sternotomy. Chronic cardiomegaly, unchanged from prior exam. No acute airspace disease, pulmonary edema, pleural fluid or pneumothorax. IMPRESSION: Post CABG with chronic cardiomegaly.  No acute abnormality. Electronically Signed   By: Keith Rake M.D.   On: 09/10/2019 03:17   Dg Knee Complete 4 Views Right  Result Date: 09/10/2019 CLINICAL DATA:  Right knee buckled last night with lateral/anterior pain since. EXAM: RIGHT KNEE - COMPLETE 4+ VIEW COMPARISON:  06/12/2019 FINDINGS: There is mild tricompartmental osteoarthritis worse over the patellofemoral joint. Small joint effusion. No evidence of fracture or dislocation. IMPRESSION: No acute findings. Mild osteoarthritis with small joint effusion. Electronically Signed   By: Marin Olp M.D.   On: 09/10/2019 09:30    Assessment and Plan:   1. Acute on chronic systolic and diastolic heart failure 2. Ischemic cardiomyopathy 3. BiV CRT-D in place - home regimen is unclear: coreg 12.5 mg BID, digoxin 0.125 mg daily, entresto 49-51 mg BID, and 40 mg torsemide daily - he reports compliance, but digoxin level is < 0.02 - admission weight peak was 188 lbs, discharge weight in 05/2019 was 173 lbs - BNP 2672 in the setting of CKD - has received only one dose of 80 mg IV lasix in the ER and feels much better  - will check Optivol - he states he was only voiding about twice daily prior to his 07/2019 admission and this did not improve after his discharge, prompting fluid buildup and repeat admission last night - will increase lasix to 40 mg IV BID - will likely need up-titration of his torsemide vs increase in entresto - pt states he has been on this dose  of entresto for 2 weeks, but pharmacy reports this was placed on hold in June   4. Acute on chronic kidney disease stage III - sCr 1.67 (1.56) - baseline creatinine appears to be 1.5-1.7 - K 3.2 - will need to cautiously supplement   5. CAD s/p CABG x 2 (2010) 6. Hyperlipidemia - continue ASA and lipitor - no anginal symptoms - patent grafts and stable disease on cath 2018 HS troponin 52 --> 53 - likely related to demand ischemia in the setting of CHF exacerbation   7. COPD 8. Former smoker - stable     For questions or updates, please contact [facility] Please consult [email or web address] for contact info under     Signed, Ledora Bottcher, PA  09/11/2019 7:47 AM

## 2019-09-11 NOTE — Progress Notes (Signed)
[facility] Progress Note  Patient: Patrick Stewart [id number]   PCP: Shelda Pal, DO DOB: Mar 26, 1956   DOA: 09/10/2019   DOS: 09/11/2019   Date of Service: the patient was seen and examined on 09/11/2019  Chief Complaint  Patient presents with   Leg Pain   Brief hospital course: Pt. with PMH of chronic systolic CHF, AICD placement, CAD S/P CABG, chronic renal disease stage III, hyperlipidemia, type II DM; presented with complain of shortness of breath and severe right knee pain, was found to have acute on chronic systolic CHF as well as intra-articular air and marginal erosion in right knee.  Currently further plan is continue diuresis and follow-up on recommendation of orthopedics.  Subjective: Reports shortness of breath, swelling of the belly as well as bilateral leg swelling.  Reports severe pain in his right knee radiating from his hip all the way down to his knee.  He denies any trauma or injury.  No popping locking sound.  No fever no chills.  No recent procedures.  Assessment and Plan: 1.  Acute on chronic combined systolic and diastolic CHF. S/P BiV CRT-D placement CAD CABG Patient presents with complaints of volume overload and shortness of breath. Significant exacerbation of his chronic CHF. Suspect poor compliance given his digoxin level being undetectable. Patient received IV Lasix in the ER and was started on IV Lasix GTT. Currently iatrogenic to IV Lasix 40 mg daily which was increased by cardiology to IV Lasix 40 mg twice daily given his significant volume overload. We will monitor recommendation from cardiology and appreciate their assistance. Currently holding his Entresto Continue beta-blocker, continue the digoxin, added BiDil for afterload reduction. Renal function has mildly worsened since yesterday after diuresis need to monitor closely for further worsening. Refuses Unna boots.  2.  Chronic kidney disease stage III Serum creatinine 1.67.   BUN 30.  Chronically elevated with her baseline around 1.7-1.8. Currently receiving IV diuresis. Strict in and out Daily weight monitor closely. If the renal function worsens may require inotropic support.  3.  Anasarca, ascites, edema Grossly volume overloaded generalized edema as well as ascites. Currently no indication for paracentesis for now. Likely secondary to CHF. Also lower extremity Doppler negative for any DVT. Monitor response with diuresis. Refuses Unna boots.  4. Type 2 Diabetes Mellitus, moderately controled with neuropathic and renal complication last hemoglobin A1c was 6.8 in July 2020. Blood glucose well  -hold his oral hypoglycemic agents. -On insulin sliding scale moderate.  5.  Severe right knee pain Patient reports nontraumatic severe right knee pain which started since 09/10/2019. X-ray shows evidence of effusion. No further etiology identified. Patient had out of proportion pain to examination and was not allowing active or passive movement. Initial plan was to obtain an MRI but due to patient's ICD MRI is not possible. CT hip and CT knee were performed since the patient reported pain primarily radiating from his hip. Orthopedic is consulted. We will follow-up on recommendation. There is a concern about intra-articular area with suspected secondary to arthrocentesis. Orthopedic is considering intra-articular joint placement for possible crystal induced arthropathy. We will discontinue colchicine.  6.  CAD Hyperlipidemia Continue 81 mg aspirin, 80 mg Lipitor  7.  Diabetic neuropathy Continue Cymbalta, continue gabapentin, continue nortriptyline, PRN oxycodone for pain control.  8.  Hypokalemia. Replaced. Monitor.  9.  Anemia of chronic disease. Baseline serum hemoglobin around 11. Currently stable.  No active bleeding.  Monitor.  Diet: Cardiac diet  DVT Prophylaxis: Subcutaneous Heparin  Advance goals of care discussion: Full code  Family  Communication: no family was present at bedside, at the time of interview.   Disposition:  Discharge to home.  Consultants: cardiology  Procedures: Echocardiogram   Scheduled Meds:  aspirin EC  81 mg Oral Daily   atorvastatin  80 mg Oral Daily   carvedilol  12.5 mg Oral BID WC   digoxin  125 mcg Oral Daily   DULoxetine  20 mg Oral Daily   enoxaparin (LOVENOX) injection  40 mg Subcutaneous Daily   furosemide  40 mg Intravenous BID   gabapentin  600 mg Oral BID   influenza vac split quadrivalent PF  0.5 mL Intramuscular Tomorrow-1000   insulin aspart  0-15 Units Subcutaneous TID WC   insulin aspart  0-5 Units Subcutaneous QHS   isosorbide-hydrALAZINE  2 tablet Oral TID   lidocaine  2 patch Transdermal Q24H   lidocaine (PF)  5 mL Other Once   methylPREDNISolone acetate  80 mg Intra-articular Once   nortriptyline  10 mg Oral QHS   pneumococcal 23 valent vaccine  0.5 mL Intramuscular Tomorrow-1000   sodium chloride flush  3 mL Intravenous Q12H   Continuous Infusions:  sodium chloride     PRN Meds: sodium chloride, acetaminophen, albuterol, ondansetron (ZOFRAN) IV, oxyCODONE-acetaminophen, sodium chloride flush Antibiotics: Anti-infectives (From admission, onward)   None       Objective: Physical Exam: Vitals:   09/10/19 2233 09/11/19 0500 09/11/19 1009 09/11/19 1354  BP: (!) 147/92   98/60  Pulse: 72  74 70  Resp: 14   20  Temp: 98.2 F (36.8 C)   98.1 F (36.7 C)  TempSrc: Oral   Oral  SpO2: 99%   94%  Weight:  83.8 kg    Height:        Intake/Output Summary (Last 24 hours) at 09/11/2019 1752 Last data filed at 09/11/2019 1357 Gross per 24 hour  Intake 120 ml  Output 3350 ml  Net -3230 ml   Filed Weights   09/10/19 0156 09/10/19 1520 09/11/19 0500  Weight: 84.4 kg 85.4 kg 83.8 kg   General: alert and oriented to time, place, and person. Appear in marked distress, affect appropriate Eyes: PERRL, Conjunctiva normal ENT: Oral Mucosa Clear,  moist  Neck: positive JVD, no Abnormal Mass Or lumps Cardiovascular: S1 and S2 Present, aortic systolic  Murmur, peripheral pulses symmetrical Respiratory: good respiratory effort, Bilateral Air entry equal and Decreased, no signs of accessory muscle use, bilateral  Crackles, no wheezes Abdomen: Bowel Sound present, Soft and distended, no tenderness, no hernia Skin: no rashes  Extremities: bilateral  Pedal edema, no calf tenderness Neurologic: without any new focal findings Gait not checked due to patient safety concerns  Data Reviewed: I have personally reviewed and interpreted daily labs, tele strips, imagings as discussed above. I reviewed all nursing notes, pharmacy notes, vitals, pertinent old records I have discussed plan of care as described above with RN and patient/family.  CBC: Recent Labs  Lab 09/10/19 0237  WBC 4.8  NEUTROABS 2.9  HGB 11.7*  HCT 37.3*  MCV 94.7  PLT [id number]   Basic Metabolic Panel: Recent Labs  Lab 09/10/19 0237 09/11/19 0509  NA 135 135  K 3.6 3.2*  CL 96* 93*  CO2 27 33*  GLUCOSE 143* 123*  BUN 30* 30*  CREATININE 1.56* 1.67*  CALCIUM 8.8* 8.5*    Liver Function Tests: Recent Labs  Lab 09/10/19 0237  AST 25  ALT 13  ALKPHOS 104  BILITOT 2.4*  PROT 8.2*  ALBUMIN 3.0*   No results for input(s): LIPASE, AMYLASE in the last 168 hours. No results for input(s): AMMONIA in the last 168 hours. Coagulation Profile: No results for input(s): INR, PROTIME in the last 168 hours. Cardiac Enzymes: No results for input(s): CKTOTAL, CKMB, CKMBINDEX, TROPONINI in the last 168 hours. BNP (last 3 results) No results for input(s): PROBNP in the last 8760 hours. CBG: Recent Labs  Lab 09/10/19 2228 09/11/19 0759 09/11/19 0838 09/11/19 1123 09/11/19 1650  GLUCAP 167* 113* 112* 166* 70   Studies: Ct Knee Right Wo Contrast  Result Date: 09/11/2019 CLINICAL DATA:  Right knee pain EXAM: CT OF THE RIGHT KNEE WITHOUT CONTRAST TECHNIQUE:  Multidetector CT imaging of the RIGHT knee was performed according to the standard protocol. Multiplanar CT image reconstructions were also generated. COMPARISON:  X-ray 09/10/2019 FINDINGS: Bones/Joint/Cartilage No acute fracture or dislocation. Moderate tricompartmental osteoarthritis manifested by joint space narrowing and marginal osteophyte formation. Suggestion of marginal erosions involving the knee, most notably at the medial tibial plateau (series 5, image 83) as well as probable involvement of the medial and lateral femoral condyles including the popliteal hiatus (series 5, image 66). Chondrocalcinosis of the menisci. Small to moderate sized joint effusion with thickened appearance of the joint capsule. There is a small amount of intra-articular air within the knee joint. Ligaments Suboptimally assessed by CT. Muscles and Tendons Relatively preserved muscle bulk without significant atrophy or fatty infiltration. Tendinous structures grossly intact. Soft tissues Mild nonspecific edema within the superficial soft tissues of the visualized calf. Just posterior to the pes anserine tendon insertion is a well circumscribed nodule measuring 1.4 x 1.1 x 1.3 cm (series 6, image 118; series 9, image 57). IMPRESSION: 1. No acute fracture or malalignment of the right knee. 2. Small to moderate knee joint effusion with intra-articular air. Correlate for any recent joint injection/aspiration as these findings could be iatrogenic. Alternatively, in the setting of trauma this could reflect traumatic arthrotomy. In the absence of recent trauma or instrumentation, a gas-forming infection must be considered. 3. Findings suggestive of marginal erosions involving the distal femur and tibial plateau, which raise the possibility of a crystalline arthropathy such as gout. 4. Nonspecific 1.4 cm nodule within the posteromedial soft tissues of the proximal calf near the pes anserine tendon insertion. Consider follow-up with a  nonemergent MRI of the knee with and without intravenous contrast. These results will be called to the ordering clinician or representative by the Radiologist Assistant, and communication documented in the PACS or zVision Dashboard. Electronically Signed   By: Davina Poke M.D.   On: 09/11/2019 17:23   Ct Hip Right Wo Contrast  Result Date: 09/11/2019 CLINICAL DATA:  Right hip pain, AVN suspected. EXAM: CT OF THE RIGHT HIP WITHOUT CONTRAST TECHNIQUE: Multidetector CT imaging of the right hip was performed according to the standard protocol. Multiplanar CT image reconstructions were also generated. COMPARISON:  X-rays 07/15/2019, CT 10/13/2015 FINDINGS: Bones/Joint/Cartilage No acute fracture. No malalignment. No evidence of femoral head AVN. Femoral head contour is maintained. Mild right hip osteoarthritis with prominent marginal osteophyte formation. Unchanged appearance of 1.7 cm lucent lesion with sclerotic margins within the intertrochanteric aspect of the proximal right femur, stable compared to 10/13/2015 and is benign. No new or suspicious osseous lesion. Trace right hip joint effusion. Ligaments Suboptimally assessed by CT. Muscles and Tendons Preserved muscle bulk without significant atrophy or fatty infiltration. Tendinous structures grossly intact, suboptimally evaluated. Soft tissues Mild subcutaneous edema  predominantly involving the anterolateral aspect of the right hip and proximal femur. Moderate volume of ascites within the visualized portion of the pelvis. Vascular calcifications noted. Fat containing right inguinal hernia. IMPRESSION: 1. Negative for right hip fracture or dislocation. No CT evidence to suggest avascular necrosis. 2. Mild right hip osteoarthritis. 3. Trace right hip joint effusion, which is nonspecific. If there is high clinical concern for septic arthritis, consider arthrocentesis. 4. Nonspecific subcutaneous edema overlying the anterior and anterolateral aspects of the  right hip and thigh. Findings could be related to fluid overload versus cellulitis in the appropriate clinical setting. 5. Moderate volume ascites within the visualized pelvis. Electronically Signed   By: Davina Poke M.D.   On: 09/11/2019 17:31   Vas [place] Lower Extremity Venous (dvt)  Result Date: 09/11/2019  Lower Venous Study Indications: Edema, and Pain.  Risk Factors: None identified. Limitations: Poor ultrasound/tissue interface and patient positioning, patient pain tolerance. Comparison Study: No prior studies. Performing Technologist: Oliver Hum RVT  Examination Guidelines: A complete evaluation includes B-mode imaging, spectral Doppler, color Doppler, and power Doppler as needed of all accessible portions of each vessel. Bilateral testing is considered an integral part of a complete examination. Limited examinations for reoccurring indications may be performed as noted.  +---------+---------------+---------+-----------+----------+--------------+  RIGHT     Compressibility Phasicity Spontaneity Properties Thrombus Aging  +---------+---------------+---------+-----------+----------+--------------+  CFV       Full            Yes       Yes                                    +---------+---------------+---------+-----------+----------+--------------+  SFJ       Full                                                             +---------+---------------+---------+-----------+----------+--------------+  FV Prox   Full                                                             +---------+---------------+---------+-----------+----------+--------------+  FV Mid    Full                                                             +---------+---------------+---------+-----------+----------+--------------+  FV Distal Full                                                             +---------+---------------+---------+-----------+----------+--------------+  PFV       Full                                                              +---------+---------------+---------+-----------+----------+--------------+  POP       Full            Yes       Yes                                    +---------+---------------+---------+-----------+----------+--------------+  PTV       Full                                                             +---------+---------------+---------+-----------+----------+--------------+  PERO      Full                                                             +---------+---------------+---------+-----------+----------+--------------+   +---------+---------------+---------+-----------+----------+--------------+  LEFT      Compressibility Phasicity Spontaneity Properties Thrombus Aging  +---------+---------------+---------+-----------+----------+--------------+  CFV       Full            Yes       Yes                                    +---------+---------------+---------+-----------+----------+--------------+  SFJ       Full                                                             +---------+---------------+---------+-----------+----------+--------------+  FV Prox   Full                                                             +---------+---------------+---------+-----------+----------+--------------+  FV Mid    Full                                                             +---------+---------------+---------+-----------+----------+--------------+  FV Distal Full                                                             +---------+---------------+---------+-----------+----------+--------------+  PFV       Full                                                             +---------+---------------+---------+-----------+----------+--------------+  POP       Full            Yes       Yes                                    +---------+---------------+---------+-----------+----------+--------------+  PTV       Full                                                              +---------+---------------+---------+-----------+----------+--------------+  PERO      Full                                                             +---------+---------------+---------+-----------+----------+--------------+     Summary: Right: There is no evidence of deep vein thrombosis in the lower extremity. However, portions of this examination were limited- see technologist comments above. No cystic structure found in the popliteal fossa. Left: There is no evidence of deep vein thrombosis in the lower extremity. However, portions of this examination were limited- see technologist comments above. No cystic structure found in the popliteal fossa.  *See table(s) above for measurements and observations.    Preliminary      Time spent: 35 minutes  Author: Berle Mull, MD [facility] 09/11/2019 5:52 PM  To reach On-call, see care teams to locate the attending and reach out to them via [email or web address]. If 7PM-7AM, please contact night-coverage If you still have difficulty reaching the attending provider, please page the [facility] (Director on Call) for [facility] on amion for assistance.

## 2019-09-11 NOTE — Progress Notes (Signed)
Bilateral lower extremity venous duplex has been completed. Preliminary results can be found in CV Proc through chart review.   09/11/19 11:50 AM Patrick Stewart RVT

## 2019-09-12 ENCOUNTER — Inpatient Hospital Stay (HOSPITAL_COMMUNITY): Payer: Medicare Other

## 2019-09-12 DIAGNOSIS — I361 Nonrheumatic tricuspid (valve) insufficiency: Secondary | ICD-10-CM

## 2019-09-12 DIAGNOSIS — I351 Nonrheumatic aortic (valve) insufficiency: Secondary | ICD-10-CM

## 2019-09-12 LAB — GLUCOSE, CAPILLARY
Glucose-Capillary: 161 mg/dL — ABNORMAL HIGH (ref 70–99)
Glucose-Capillary: 186 mg/dL — ABNORMAL HIGH (ref 70–99)
Glucose-Capillary: 187 mg/dL — ABNORMAL HIGH (ref 70–99)
Glucose-Capillary: 204 mg/dL — ABNORMAL HIGH (ref 70–99)

## 2019-09-12 LAB — ECHOCARDIOGRAM COMPLETE
Height: 70 in
Weight: 2885.38 oz

## 2019-09-12 LAB — BASIC METABOLIC PANEL
Anion gap: 14 (ref 5–15)
Anion gap: 12 (ref 5–15)
BUN: 45 mg/dL — ABNORMAL HIGH (ref 8–23)
BUN: 54 mg/dL — ABNORMAL HIGH (ref 8–23)
CO2: 29 mmol/L (ref 22–32)
CO2: 30 mmol/L (ref 22–32)
Calcium: 8.4 mg/dL — ABNORMAL LOW (ref 8.9–10.3)
Calcium: 8.1 mg/dL — ABNORMAL LOW (ref 8.9–10.3)
Chloride: 92 mmol/L — ABNORMAL LOW (ref 98–111)
Chloride: 92 mmol/L — ABNORMAL LOW (ref 98–111)
Creatinine, Ser: 2.1 mg/dL — ABNORMAL HIGH (ref 0.61–1.24)
Creatinine, Ser: 2.41 mg/dL — ABNORMAL HIGH (ref 0.61–1.24)
GFR calc Af Amer: 38 mL/min — ABNORMAL LOW (ref >60)
GFR calc Af Amer: 32 mL/min — ABNORMAL LOW (ref >60)
GFR calc non Af Amer: 33 mL/min — ABNORMAL LOW (ref >60)
GFR calc non Af Amer: 28 mL/min — ABNORMAL LOW (ref >60)
Glucose, Bld: 161 mg/dL — ABNORMAL HIGH (ref 70–99)
Glucose, Bld: 188 mg/dL — ABNORMAL HIGH (ref 70–99)
Potassium: 4 mmol/L (ref 3.5–5.1)
Potassium: 4.4 mmol/L (ref 3.5–5.1)
Sodium: 135 mmol/L (ref 135–145)
Sodium: 134 mmol/L — ABNORMAL LOW (ref 135–145)

## 2019-09-12 LAB — MAGNESIUM: Magnesium: 1.7 mg/dL (ref 1.7–2.4)

## 2019-09-12 LAB — HIV ANTIBODY (ROUTINE TESTING W REFLEX): HIV Screen 4th Generation wRfx: NONREACTIVE

## 2019-09-12 MED ORDER — ALBUMIN HUMAN 25 % IV SOLN
12.5000 g | Freq: Once | INTRAVENOUS | Status: AC
  Administered 2019-09-12: 12.5 g via INTRAVENOUS
  Filled 2019-09-12: qty 50

## 2019-09-12 NOTE — Plan of Care (Signed)
  Problem: Elimination: Goal: Will not experience complications related to bowel motility Outcome: Progressing   Problem: Safety: Goal: Ability to remain free from injury will improve Outcome: Progressing   

## 2019-09-12 NOTE — Progress Notes (Signed)
Echocardiogram 2D Echocardiogram has been performed.  Oneal Deputy Makendra Vigeant 09/12/2019, 9:36 AM

## 2019-09-12 NOTE — Progress Notes (Signed)
Progress Note  Patient Name: Patrick Stewart Date of Encounter: 09/12/2019  Primary Cardiologist: Loralie Champagne, MD   Subjective   Echo tech finishing. Pt states his swelling is improved - biggest complaint continues to be his right knee  Inpatient Medications    Scheduled Meds:  aspirin EC  81 mg Oral Daily   atorvastatin  80 mg Oral Daily   carvedilol  12.5 mg Oral BID WC   digoxin  125 mcg Oral Daily   DULoxetine  20 mg Oral Daily   enoxaparin (LOVENOX) injection  40 mg Subcutaneous Daily   furosemide  40 mg Intravenous BID   gabapentin  600 mg Oral BID   influenza vac split quadrivalent PF  0.5 mL Intramuscular Tomorrow-1000   insulin aspart  0-15 Units Subcutaneous TID WC   insulin aspart  0-5 Units Subcutaneous QHS   isosorbide-hydrALAZINE  2 tablet Oral TID   lidocaine  2 patch Transdermal Q24H   lidocaine (PF)  5 mL Other Once   methylPREDNISolone acetate  80 mg Intra-articular Once   nortriptyline  10 mg Oral QHS   pneumococcal 23 valent vaccine  0.5 mL Intramuscular Tomorrow-1000   sodium chloride flush  3 mL Intravenous Q12H   Continuous Infusions:  sodium chloride     PRN Meds: sodium chloride, acetaminophen, albuterol, ondansetron (ZOFRAN) IV, oxyCODONE-acetaminophen, sodium chloride flush   Vital Signs    Vitals:   09/11/19 1354 09/11/19 2139 09/12/19 0500 09/12/19 0542  BP: 98/60 113/70  121/73  Pulse: 70 71  77  Resp: 20 20  20   Temp: 98.1 F (36.7 C) 97.8 F (36.6 C)  97.9 F (36.6 C)  TempSrc: Oral Oral  Oral  SpO2: 94% 96%  90%  Weight:   81.8 kg   Height:        Intake/Output Summary (Last 24 hours) at 09/12/2019 0803 Last data filed at 09/12/2019 0521 Gross per 24 hour  Intake 363 ml  Output 3450 ml  Net -3087 ml   Last 3 Weights 09/12/2019 09/11/2019 09/10/2019  Weight (lbs) 180 lb 5.4 oz 184 lb 11.9 oz 188 lb 4.4 oz  Weight (kg) 81.8 kg 83.8 kg 85.4 kg      Telemetry    V-paced in the 70s - Personally  Reviewed  ECG    No new tracings - Personally Reviewed  Physical Exam   GEN: No acute distress.   Neck: + JVD - improved Cardiac: RRR, no murmurs, rubs, or gallops.  Respiratory: lung sounds improved from yesterday, still with scattered crackles GI: Soft, nontender, non-distended  MS: 1+ B LE edema; No deformity. Neuro:  Nonfocal  Psych: Normal affect   Labs    High Sensitivity Troponin:   Recent Labs  Lab 09/10/19 0237 09/10/19 0445  TROPONINIHS 52* 53*      Chemistry Recent Labs  Lab 09/10/19 0237 09/11/19 0509 09/12/19 0402  NA 135 135 135  K 3.6 3.2* 4.0  CL 96* 93* 92*  CO2 27 33* 29  GLUCOSE 143* 123* 161*  BUN 30* 30* 45*  CREATININE 1.56* 1.67* 2.10*  CALCIUM 8.8* 8.5* 8.4*  PROT 8.2*  --   --   ALBUMIN 3.0*  --   --   AST 25  --   --   ALT 13  --   --   ALKPHOS 104  --   --   BILITOT 2.4*  --   --   GFRNONAA 47* 43* 33*  GFRAA 54* 50* 38*  [facility] 12 9 14      Hematology Recent Labs  Lab 09/10/19 0237  WBC 4.8  RBC 3.94*  HGB 11.7*  HCT 37.3*  MCV 94.7  MCH 29.7  MCHC 31.4  RDW 17.8*  PLT 199    BNP Recent Labs  Lab 09/10/19 0237  BNP 2,672.7*     DDimer No results for input(s): DDIMER in the last 168 hours.   Radiology    Ct Knee Right Wo Contrast  Result Date: 09/11/2019 CLINICAL DATA:  Right knee pain EXAM: CT OF THE RIGHT KNEE WITHOUT CONTRAST TECHNIQUE: Multidetector CT imaging of the RIGHT knee was performed according to the standard protocol. Multiplanar CT image reconstructions were also generated. COMPARISON:  X-ray 09/10/2019 FINDINGS: Bones/Joint/Cartilage No acute fracture or dislocation. Moderate tricompartmental osteoarthritis manifested by joint space narrowing and marginal osteophyte formation. Suggestion of marginal erosions involving the knee, most notably at the medial tibial plateau (series 5, image 83) as well as probable involvement of the medial and lateral femoral condyles including the popliteal hiatus  (series 5, image 66). Chondrocalcinosis of the menisci. Small to moderate sized joint effusion with thickened appearance of the joint capsule. There is a small amount of intra-articular air within the knee joint. Ligaments Suboptimally assessed by CT. Muscles and Tendons Relatively preserved muscle bulk without significant atrophy or fatty infiltration. Tendinous structures grossly intact. Soft tissues Mild nonspecific edema within the superficial soft tissues of the visualized calf. Just posterior to the pes anserine tendon insertion is a well circumscribed nodule measuring 1.4 x 1.1 x 1.3 cm (series 6, image 118; series 9, image 57). IMPRESSION: 1. No acute fracture or malalignment of the right knee. 2. Small to moderate knee joint effusion with intra-articular air. Correlate for any recent joint injection/aspiration as these findings could be iatrogenic. Alternatively, in the setting of trauma this could reflect traumatic arthrotomy. In the absence of recent trauma or instrumentation, a gas-forming infection must be considered. 3. Findings suggestive of marginal erosions involving the distal femur and tibial plateau, which raise the possibility of a crystalline arthropathy such as gout. 4. Nonspecific 1.4 cm nodule within the posteromedial soft tissues of the proximal calf near the pes anserine tendon insertion. Consider follow-up with a nonemergent MRI of the knee with and without intravenous contrast. These results will be called to the ordering clinician or representative by the Radiologist Assistant, and communication documented in the PACS or zVision Dashboard. Electronically Signed   By: Davina Poke M.D.   On: 09/11/2019 17:23   Ct Hip Right Wo Contrast  Result Date: 09/11/2019 CLINICAL DATA:  Right hip pain, AVN suspected. EXAM: CT OF THE RIGHT HIP WITHOUT CONTRAST TECHNIQUE: Multidetector CT imaging of the right hip was performed according to the standard protocol. Multiplanar CT image  reconstructions were also generated. COMPARISON:  X-rays 07/15/2019, CT 10/13/2015 FINDINGS: Bones/Joint/Cartilage No acute fracture. No malalignment. No evidence of femoral head AVN. Femoral head contour is maintained. Mild right hip osteoarthritis with prominent marginal osteophyte formation. Unchanged appearance of 1.7 cm lucent lesion with sclerotic margins within the intertrochanteric aspect of the proximal right femur, stable compared to 10/13/2015 and is benign. No new or suspicious osseous lesion. Trace right hip joint effusion. Ligaments Suboptimally assessed by CT. Muscles and Tendons Preserved muscle bulk without significant atrophy or fatty infiltration. Tendinous structures grossly intact, suboptimally evaluated. Soft tissues Mild subcutaneous edema predominantly involving the anterolateral aspect of the right hip and proximal femur. Moderate volume of ascites within the visualized portion of the  pelvis. Vascular calcifications noted. Fat containing right inguinal hernia. IMPRESSION: 1. Negative for right hip fracture or dislocation. No CT evidence to suggest avascular necrosis. 2. Mild right hip osteoarthritis. 3. Trace right hip joint effusion, which is nonspecific. If there is high clinical concern for septic arthritis, consider arthrocentesis. 4. Nonspecific subcutaneous edema overlying the anterior and anterolateral aspects of the right hip and thigh. Findings could be related to fluid overload versus cellulitis in the appropriate clinical setting. 5. Moderate volume ascites within the visualized pelvis. Electronically Signed   By: Davina Poke M.D.   On: 09/11/2019 17:31   Dg Knee Complete 4 Views Right  Result Date: 09/10/2019 CLINICAL DATA:  Right knee buckled last night with lateral/anterior pain since. EXAM: RIGHT KNEE - COMPLETE 4+ VIEW COMPARISON:  06/12/2019 FINDINGS: There is mild tricompartmental osteoarthritis worse over the patellofemoral joint. Small joint effusion. No evidence  of fracture or dislocation. IMPRESSION: No acute findings. Mild osteoarthritis with small joint effusion. Electronically Signed   By: Marin Olp M.D.   On: 09/10/2019 09:30   Vas [place] Lower Extremity Venous (dvt)  Result Date: 09/11/2019  Lower Venous Study Indications: Edema, and Pain.  Risk Factors: None identified. Limitations: Poor ultrasound/tissue interface and patient positioning, patient pain tolerance. Comparison Study: No prior studies. Performing Technologist: Oliver Hum RVT  Examination Guidelines: A complete evaluation includes B-mode imaging, spectral Doppler, color Doppler, and power Doppler as needed of all accessible portions of each vessel. Bilateral testing is considered an integral part of a complete examination. Limited examinations for reoccurring indications may be performed as noted.  +---------+---------------+---------+-----------+----------+--------------+  RIGHT     Compressibility Phasicity Spontaneity Properties Thrombus Aging  +---------+---------------+---------+-----------+----------+--------------+  CFV       Full            Yes       Yes                                    +---------+---------------+---------+-----------+----------+--------------+  SFJ       Full                                                             +---------+---------------+---------+-----------+----------+--------------+  FV Prox   Full                                                             +---------+---------------+---------+-----------+----------+--------------+  FV Mid    Full                                                             +---------+---------------+---------+-----------+----------+--------------+  FV Distal Full                                                             +---------+---------------+---------+-----------+----------+--------------+  PFV       Full                                                              +---------+---------------+---------+-----------+----------+--------------+  POP       Full            Yes       Yes                                    +---------+---------------+---------+-----------+----------+--------------+  PTV       Full                                                             +---------+---------------+---------+-----------+----------+--------------+  PERO      Full                                                             +---------+---------------+---------+-----------+----------+--------------+   +---------+---------------+---------+-----------+----------+--------------+  LEFT      Compressibility Phasicity Spontaneity Properties Thrombus Aging  +---------+---------------+---------+-----------+----------+--------------+  CFV       Full            Yes       Yes                                    +---------+---------------+---------+-----------+----------+--------------+  SFJ       Full                                                             +---------+---------------+---------+-----------+----------+--------------+  FV Prox   Full                                                             +---------+---------------+---------+-----------+----------+--------------+  FV Mid    Full                                                             +---------+---------------+---------+-----------+----------+--------------+  FV Distal Full                                                             +---------+---------------+---------+-----------+----------+--------------+  PFV       Full                                                             +---------+---------------+---------+-----------+----------+--------------+  POP       Full            Yes       Yes                                    +---------+---------------+---------+-----------+----------+--------------+  PTV       Full                                                              +---------+---------------+---------+-----------+----------+--------------+  PERO      Full                                                             +---------+---------------+---------+-----------+----------+--------------+     Summary: Right: There is no evidence of deep vein thrombosis in the lower extremity. However, portions of this examination were limited- see technologist comments above. No cystic structure found in the popliteal fossa. Left: There is no evidence of deep vein thrombosis in the lower extremity. However, portions of this examination were limited- see technologist comments above. No cystic structure found in the popliteal fossa.  *See table(s) above for measurements and observations.    Preliminary     Cardiac Studies   Echo pending   Echocardiogram 06/05/2019 IMPRESSIONS 1. The left ventricle has severely reduced systolic function, with an ejection fraction of 20-25%. The cavity size was mildly dilated. There is mildly increased left ventricular wall thickness. Left ventricular diastolic Doppler parameters are  consistent with pseudonormalization. Inferior and inferoseptal akinesis, the other wall segments are hypokinetic. 2. The right ventricle has moderately reduced systolic function. The cavity was normal. There is no increase in right ventricular wall thickness. 3. Left atrial size was moderately dilated. 4. Right atrial size was mildly dilated. 5. Trivial pericardial effusion is present. 6. There is a pleural effusion present. 7. The aortic valve is tricuspid. Mild calcification of the aortic valve. Aortic valve regurgitation is moderate by color flow Doppler. No stenosis of the aortic valve. 8. The aortic root is normal in size and structure. 9. The inferior vena cava was dilated in size with <50% respiratory variability. PA systolic pressure 60 mmHg. 10. No evidence of mitral valve stenosis. Trivial mitral regurgitation.  Patient Profile     63 y.o.  male  with a hx of chronic systolic and diastolic heart failure, ICD in place ([employer]tronic), CAD s/p CABG (2010), DM, HTN, HLD, moderate pulmonary HTN, gout, and CKD stage III who is admitted for the evaluation of CHF exacerbation  Assessment & Plan    1. Acute on chronic systolic and diastolic heart failure 2. Ischemic  cardiomyopathy 3. BiV CRT-D in place - he is diuresing on 40 mg IV lasix BID - has received 2 doses so far in addition to lasix drip that was initiated in the ER prior to our evaluation - he is overall net negative nearly 5L with 3.4 L urine output yesterday - weight is 180 lbs, down 8 lbs from peak weight of 188 lbs - will continue diuresis today - may hold second dose of lasix today for renal function - continue bidil, digoxin, coreg - repeat echo pending   4. Acute on chronic kidney disease stage III - [place] 2.10 (1.67) - baseline is 1.5-1.7 - K 4.0 - entresto stopped yesterday after receiving a total of 3 doses this admission - will continue diuresis today and monitor renal function - suspect his creatinine will improve off of entresto and with diuresis    5. CAD s/p CABG x 2 2010 6. Hyperlipidemia - continue ASA and lipitor - no anginal symptoms - patent grafts and stable disease on cath 2018 - hs trop consistent with demand ischemia in the setting of CHF exacerbation   7. Right knee pain - ortho consulted and removed 25 cc of normal fluid from right knee with subsequent steroid injection   8. COPD 9. Former smoker - counseled on cessation       For questions or updates, please contact [place] Please consult [email or web address] for contact info under        Signed, Ledora Bottcher, PA  09/12/2019, 8:03 AM

## 2019-09-12 NOTE — Evaluation (Signed)
Physical Therapy Evaluation Patient Details Name: Patrick Stewart MRN: [id number] DOB: 1956-04-03 Today's Date: 09/12/2019   History of Present Illness  Pt. with PMH of chronic systolic CHF, AICD placement, CAD S/P CABG, chronic renal disease stage III, hyperlipidemia, type II DM; presented with complain of shortness of breath and severe right knee pain, was found to have acute on chronic systolic CHF as well as intra-articular air and marginal erosion in right knee; pt had right knee aspiration and injection per ortho  Clinical Impression  Pt admitted with above diagnosis.  Pt currently with functional limitations due to the deficits listed below (see PT Problem List). Pt will benefit from skilled PT to increase their independence and safety with mobility to allow discharge to the venue listed below.       Follow Up Recommendations Home health PT    Equipment Recommendations       Recommendations for Other Services       Precautions / Restrictions Precautions Precautions: Fall Restrictions Weight Bearing Restrictions: No      Mobility  Bed Mobility Overal bed mobility: Needs Assistance Bed Mobility: Supine to Sit;Sit to Supine     Supine to sit: Min guard Sit to supine: Min guard   General bed mobility comments: for safety  Transfers Overall transfer level: Needs assistance Equipment used: Rolling walker (2 wheeled) Transfers: Sit to/from Stand Sit to Stand: Min assist         General transfer comment: assist to rise and stabilize  Ambulation/Gait Ambulation/Gait assistance: Min assist Gait Distance (Feet): 24 Feet Assistive device: Rolling walker (2 wheeled) Gait Pattern/deviations: Step-to pattern;Step-through pattern;Decreased weight shift to right;Decreased stance time - right     General Gait Details: cues for sequence and RW position  Stairs            Wheelchair Mobility    Modified Rankin (Stroke Patients Only)       Balance Overall  balance assessment: Needs assistance Sitting-balance support: Feet supported;No upper extremity supported Sitting balance-Leahy Scale: Fair     Standing balance support: During functional activity;Bilateral upper extremity supported Standing balance-Leahy Scale: Poor Standing balance comment: reliant on UEs                             Pertinent Vitals/Pain Pain Assessment: No/denies pain    Home Living Family/patient expects to be discharged to:: Private residence Living Arrangements: Spouse/significant other Available Help at Discharge: Family Type of Home: House Home Access: Stairs to enter   Technical brewer of Steps: 3 Home Layout: One level Home Equipment: Environmental consultant - 2 wheels;Cane - single point;Crutches      Prior Function                 Hand Dominance        Extremity/Trunk Assessment   Upper Extremity Assessment Upper Extremity Assessment: Overall WFL for tasks assessed    Lower Extremity Assessment Lower Extremity Assessment: RLE deficits/detail RLE Deficits / Details: AROM grossly 15 to 95 degrees flexion knee, strength grossly 3/5, limited by pain       Communication   Communication: No difficulties  Cognition Arousal/Alertness: Awake/alert Behavior During Therapy: WFL for tasks assessed/performed Overall Cognitive Status: Within Functional Limits for tasks assessed  General Comments General comments (skin integrity, edema, etc.): encouraged right knee ROM    Exercises     Assessment/Plan    PT Assessment Patient needs continued PT services  PT Problem List Decreased strength;Decreased range of motion;Decreased balance;Decreased activity tolerance;Decreased mobility;Decreased knowledge of use of DME;Pain       PT Treatment Interventions DME instruction;Gait training;Functional mobility training;Therapeutic exercise;Patient/family education;Therapeutic activities     PT Goals (Current goals can be found in the Care Plan section)  Acute Rehab PT Goals PT Goal Formulation: With patient Time For Goal Achievement: 09/26/19 Potential to Achieve Goals: Good    Frequency Min 3X/week   Barriers to discharge        Co-evaluation               AM-PAC PT "6 Clicks" Mobility  Outcome Measure Help needed turning from your back to your side while in a flat bed without using bedrails?: A Little Help needed moving from lying on your back to sitting on the side of a flat bed without using bedrails?: A Little Help needed moving to and from a bed to a chair (including a wheelchair)?: A Little Help needed standing up from a chair using your arms (e.g., wheelchair or bedside chair)?: A Little Help needed to walk in hospital room?: A Little Help needed climbing 3-5 steps with a railing? : A Lot 6 Click Score: 17    End of Session Equipment Utilized During Treatment: Gait belt Activity Tolerance: Patient tolerated treatment well Patient left: with call bell/phone within reach;in bed;with bed alarm set   PT Visit Diagnosis: Difficulty in walking, not elsewhere classified (R26.2)    Time: [id number] PT Time Calculation (min) (ACUTE ONLY): 17 min   Charges:   PT Evaluation $PT Eval Low Complexity: 1 Low          Kenyon Ana, PT  Pager: [phone] Acute Rehab Dept [facility]): [phone]   09/12/2019   [facility] 09/12/2019, 2:23 PM

## 2019-09-12 NOTE — Consult Note (Signed)
   [facility] CM Inpatient Consult   09/12/2019  Patrick Stewart December 24, 1956 [id number]    Patient'schart reviewed for 25%high risk score for unplanned readmission and hospitalizations under his Medicare/ [employer] plan; and to check for potential needs of [facility] services.  Review of patient's medical record reveal that:  Patient is a 63 y.o. male with a hx of chronic systolic and diastolic heart failure, ICD in place ([employer]tronic), CAD s/p CABG (2010), DM, HTN, HLD, moderate pulmonary HTN, gout, and CKD stage III, who presented with complain of shortness of breath and severe right knee pain and was admitted for the evaluation of CHF exacerbation.  His primary care provider is Dr. Riki Sheer with [facility], listed to provide transition of care follow-up.  PT note reviewed, recommends home health PT to increase his independence and safety with mobility.  Attempted to speak with patient by telephone to assess for community needs, but patient was unavailable at this time, per NT (patient care).  Plan: Will continue to follow for progression and disposition plans.  Of note, [facility] Care Management services does not replace or interfere with any services that are arranged by transition of care case management or social work.    For questions and additional information, please call:  Islam Eichinger A. Nakhia Levitan, BSN, RN-BC [facility] (covering for [place]) Cell: [phone]

## 2019-09-12 NOTE — Progress Notes (Signed)
[facility] Progress Note  Patient: Patrick Stewart [id number]   PCP: Shelda Pal, DO DOB: [birth year]   DOA: 09/10/2019   DOS: 09/12/2019   Date of Service: the patient was seen and examined on 09/12/2019  Chief Complaint  Patient presents with  . Leg Pain   Brief hospital course: Pt. with PMH of chronic systolic CHF, AICD placement, CAD S/P CABG, chronic renal disease stage III, hyperlipidemia, type II DM; presented with complain of shortness of breath and severe right knee pain, was found to have acute on chronic systolic CHF as well as intra-articular air and marginal erosion in right knee.  Currently further plan is continue diuresis and follow-up on recommendation of orthopedics.  Subjective: Pain in the knee is better.  No nausea no vomiting no fever no chills.  No chest pain abdominal pain.  Continues to have shortness of breath continues to have abdominal distention continues to have swelling of the leg.  Assessment and Plan: 1.  Acute on chronic combined systolic and diastolic CHF. S/P BiV CRT-D placement CAD CABG Patient presents with complaints of volume overload and shortness of breath. Significant exacerbation of his chronic CHF. Suspect poor compliance given his digoxin level being undetectable. Patient received IV Lasix in the ER and was started on IV Lasix GTT. Currently iatrogenic to IV Lasix 40 mg daily which was increased by cardiology to IV Lasix 40 mg twice daily given his significant volume overload. We will monitor recommendation from cardiology and appreciate their assistance. Currently holding his Entresto Continue beta-blocker, continue the digoxin, added BiDil for afterload reduction. Renal function worsens for now. Refuses Unna boots. Will provide IV albumin.  2.  Acute kidney injury on chronic kidney disease stage III Serum creatinine 1.67.  BUN 30.  Chronically elevated with her baseline around 1.7-1.8. Currently receiving IV diuresis.  Likely cardiorenal hemodynamics although intravascular volume depletion and third spacing cannot be ruled out. Strict in and out Daily weight monitor closely. If the renal function worsens may require inotropic support. We will provide IV albumin.  3.  Anasarca, ascites, edema Grossly volume overloaded generalized edema as well as ascites. Currently no indication for paracentesis for now. Likely secondary to CHF. Also lower extremity Doppler negative for any DVT. Monitor response with diuresis. Refuses Unna boots. We will provide IV albumin.  4. Type 2 Diabetes Mellitus, moderately controled with neuropathic and renal complication last hemoglobin A1c was 6.8 in July 2020. Blood glucose well  -hold his oral hypoglycemic agents. -On insulin sliding scale moderate.  5.  Severe right knee pain Severe osteoarthritis will Patient reports nontraumatic severe right knee pain which started since 09/10/2019. X-ray shows evidence of effusion. No further etiology identified. Patient had out of proportion pain to examination and was not allowing active or passive movement. Initial plan was to obtain an MRI but due to patient's ICD MRI is not possible. CT hip and CT knee were performed since the patient reported pain primarily radiating from his hip. Orthopedic is consulted. We will follow-up on recommendation. There is a concern about intra-articular area with suspected secondary to arthrocentesis. Orthopedic perform intra-articular steroid injection. Discontinue colchicine. Suspect this is secondary to severe osteoarthritis and patient will benefit from outpatient orthopedic follow-up.  6.  CAD Hyperlipidemia Continue 81 mg aspirin, 80 mg Lipitor  7.  Diabetic neuropathy Continue Cymbalta, continue gabapentin, continue nortriptyline, PRN oxycodone for pain control.  8.  Hypokalemia. Replaced. Monitor.  9.  Anemia of chronic disease. Baseline serum hemoglobin around 11. Currently  stable.  No active bleeding.  Monitor.  10.  PVC./Short NSVT Secondary to cardiomyopathy. S/P AICD placement Currently on beta-blocker. Monitor.  Diet: Cardiac diet  DVT Prophylaxis: Subcutaneous Heparin   Advance goals of care discussion: Full code  Family Communication: no family was present at bedside, at the time of interview.   Disposition:  Discharge to home.  Consultants: cardiology  Procedures: Echocardiogram   Scheduled Meds: . aspirin EC  81 mg Oral Daily  . atorvastatin  80 mg Oral Daily  . carvedilol  12.5 mg Oral BID WC  . digoxin  125 mcg Oral Daily  . DULoxetine  20 mg Oral Daily  . enoxaparin (LOVENOX) injection  40 mg Subcutaneous Daily  . furosemide  40 mg Intravenous BID  . gabapentin  600 mg Oral BID  . influenza vac split quadrivalent PF  0.5 mL Intramuscular Tomorrow-1000  . insulin aspart  0-15 Units Subcutaneous TID WC  . insulin aspart  0-5 Units Subcutaneous QHS  . isosorbide-hydrALAZINE  2 tablet Oral TID  . lidocaine  2 patch Transdermal Q24H  . lidocaine (PF)  5 mL Other Once  . methylPREDNISolone acetate  80 mg Intra-articular Once  . nortriptyline  10 mg Oral QHS  . pneumococcal 23 valent vaccine  0.5 mL Intramuscular Tomorrow-1000  . sodium chloride flush  3 mL Intravenous Q12H   Continuous Infusions: . sodium chloride    . albumin human     PRN Meds: sodium chloride, acetaminophen, albuterol, ondansetron (ZOFRAN) IV, oxyCODONE-acetaminophen, sodium chloride flush Antibiotics: Anti-infectives (From admission, onward)   None       Objective: Physical Exam: Vitals:   09/11/19 2139 09/12/19 0500 09/12/19 0542 09/12/19 1317  BP: 113/70  121/73 (!) 83/55  Pulse: 71  77 73  Resp: 20  20 20   Temp: 97.8 F (36.6 C)  97.9 F (36.6 C) 97.6 F (36.4 C)  TempSrc: Oral  Oral Oral  SpO2: 96%  90% 95%  Weight:  81.8 kg    Height:        Intake/Output Summary (Last 24 hours) at 09/12/2019 1852 Last data filed at 09/12/2019 1030  Gross per 24 hour  Intake 723 ml  Output 900 ml  Net -177 ml   Filed Weights   09/10/19 1520 09/11/19 0500 09/12/19 0500  Weight: 85.4 kg 83.8 kg 81.8 kg   General: alert and oriented to time, place, and person. Appear in mild distress, affect appropriate Eyes: PERRL, Conjunctiva normal ENT: Oral Mucosa Clear, moist  Neck: Positive JVD, no Abnormal Mass Or lumps Cardiovascular: S1 and S2 Present, no Murmur, peripheral pulses symmetrical Respiratory: good respiratory effort, Bilateral Air entry equal and Decreased, no signs of accessory muscle use, bilateral  Crackles, no wheezes Abdomen: Bowel Sound present, Soft and no tenderness, no hernia Skin: no rashes  Extremities: bilateral  Pedal edema, no calf tenderness Neurologic: without any new focal findings Gait not checked due to patient safety concerns   Data Reviewed: I have personally reviewed and interpreted daily labs, tele strips, imagings as discussed above. I reviewed all nursing notes, pharmacy notes, vitals, pertinent old records I have discussed plan of care as described above with RN and patient/family.  CBC: Recent Labs  Lab 09/10/19 0237  WBC 4.8  NEUTROABS 2.9  HGB 11.7*  HCT 37.3*  MCV 94.7  PLT [id number]   Basic Metabolic Panel: Recent Labs  Lab 09/10/19 0237 09/11/19 0509 09/12/19 0402 09/12/19 1619  NA 135 135 135 134*  K 3.6 3.2*  4.0 4.4  CL 96* 93* 92* 92*  CO2 27 33* 29 30  GLUCOSE 143* 123* 161* 188*  BUN 30* 30* 45* 54*  CREATININE 1.56* 1.67* 2.10* 2.41*  CALCIUM 8.8* 8.5* 8.4* 8.1*  MG  --   --   --  1.7    Liver Function Tests: Recent Labs  Lab 09/10/19 0237  AST 25  ALT 13  ALKPHOS 104  BILITOT 2.4*  PROT 8.2*  ALBUMIN 3.0*   No results for input(s): LIPASE, AMYLASE in the last 168 hours. No results for input(s): AMMONIA in the last 168 hours. Coagulation Profile: No results for input(s): INR, PROTIME in the last 168 hours. Cardiac Enzymes: No results for input(s): CKTOTAL,  CKMB, CKMBINDEX, TROPONINI in the last 168 hours. BNP (last 3 results) No results for input(s): PROBNP in the last 8760 hours. CBG: Recent Labs  Lab 09/11/19 1650 09/11/19 2110 09/12/19 0801 09/12/19 1150 09/12/19 1639  GLUCAP 70 178* 161* 186* 187*   Studies: US Renal  Result Date: 09/12/2019 CLINICAL DATA:  63 year old male with acute renal insufficiency. History of hypertension, and diabetes. EXAM: RENAL / URINARY TRACT ULTRASOUND COMPLETE COMPARISON:  CT of the abdomen pelvis dated 02/19/2018. FINDINGS: Right Kidney: Renal measurements: 10.0 x 4.4 x 4.3 cm = volume: 99 mL. There is diffuse increased renal echogenicity. No hydronephrosis or shadowing stone. Left Kidney: Renal measurements: 9.6 x 4.7 x 4.9 cm = volume: 114 mL. There is diffuse increased renal echogenicity. No hydronephrosis or shadowing stone. Bladder: Appears normal for degree of bladder distention. There is a small ascites. Mild lobulation of the visualized liver contour may represent early cirrhosis. Clinical correlation is recommended. IMPRESSION: 1. Echogenic kidneys in keeping with chronic kidney disease. No hydronephrosis or shadowing stone. 2. Small ascites. Electronically Signed   By: Anner Crete M.D.   On: 09/12/2019 13:21     Time spent: 35 minutes  Author: Berle Mull, MD [facility] 09/12/2019 6:52 PM  To reach On-call, see care teams to locate the attending and reach out to them via [email or web address]. If 7PM-7AM, please contact night-coverage If you still have difficulty reaching the attending provider, please page the [facility] (Director on Call) for [facility] on amion for assistance.

## 2019-09-13 DIAGNOSIS — I5021 Acute systolic (congestive) heart failure: Secondary | ICD-10-CM

## 2019-09-13 DIAGNOSIS — I509 Heart failure, unspecified: Secondary | ICD-10-CM

## 2019-09-13 LAB — COMPREHENSIVE METABOLIC PANEL
ALT: 10 U/L (ref 0–44)
AST: 17 U/L (ref 15–41)
Albumin: 2.3 g/dL — ABNORMAL LOW (ref 3.5–5.0)
Alkaline Phosphatase: 82 U/L (ref 38–126)
Anion gap: 12 (ref 5–15)
BUN: 61 mg/dL — ABNORMAL HIGH (ref 8–23)
CO2: 27 mmol/L (ref 22–32)
Calcium: 8.2 mg/dL — ABNORMAL LOW (ref 8.9–10.3)
Chloride: 91 mmol/L — ABNORMAL LOW (ref 98–111)
Creatinine, Ser: 2.41 mg/dL — ABNORMAL HIGH (ref 0.61–1.24)
GFR calc Af Amer: 32 mL/min — ABNORMAL LOW (ref >60)
GFR calc non Af Amer: 28 mL/min — ABNORMAL LOW (ref >60)
Glucose, Bld: 196 mg/dL — ABNORMAL HIGH (ref 70–99)
Potassium: 4.3 mmol/L (ref 3.5–5.1)
Sodium: 130 mmol/L — ABNORMAL LOW (ref 135–145)
Total Bilirubin: 1.5 mg/dL — ABNORMAL HIGH (ref 0.3–1.2)
Total Protein: 6.3 g/dL — ABNORMAL LOW (ref 6.5–8.1)

## 2019-09-13 LAB — CBC WITH DIFFERENTIAL/PLATELET
Abs Immature Granulocytes: 0.05 10*3/uL (ref 0.00–0.07)
Basophils Absolute: 0 10*3/uL (ref 0.0–0.1)
Basophils Relative: 0 %
Eosinophils Absolute: 0 10*3/uL (ref 0.0–0.5)
Eosinophils Relative: 0 %
HCT: 33.3 % — ABNORMAL LOW (ref 39.0–52.0)
Hemoglobin: 10.7 g/dL — ABNORMAL LOW (ref 13.0–17.0)
Immature Granulocytes: 1 %
Lymphocytes Relative: 4 %
Lymphs Abs: 0.4 10*3/uL — ABNORMAL LOW (ref 0.7–4.0)
MCH: 29.8 pg (ref 26.0–34.0)
MCHC: 32.1 g/dL (ref 30.0–36.0)
MCV: 92.8 fL (ref 80.0–100.0)
Monocytes Absolute: 0.5 10*3/uL (ref 0.1–1.0)
Monocytes Relative: 5 %
Neutro Abs: 8.8 10*3/uL — ABNORMAL HIGH (ref 1.7–7.7)
Neutrophils Relative %: 90 %
Platelets: 214 10*3/uL (ref 150–400)
RBC: 3.59 MIL/uL — ABNORMAL LOW (ref 4.22–5.81)
RDW: 17.7 % — ABNORMAL HIGH (ref 11.5–15.5)
WBC: 9.8 10*3/uL (ref 4.0–10.5)
nRBC: 0 % (ref 0.0–0.2)

## 2019-09-13 LAB — GLUCOSE, CAPILLARY
Glucose-Capillary: 176 mg/dL — ABNORMAL HIGH (ref 70–99)
Glucose-Capillary: 215 mg/dL — ABNORMAL HIGH (ref 70–99)
Glucose-Capillary: 144 mg/dL — ABNORMAL HIGH (ref 70–99)
Glucose-Capillary: 221 mg/dL — ABNORMAL HIGH (ref 70–99)

## 2019-09-13 LAB — MAGNESIUM: Magnesium: 1.8 mg/dL (ref 1.7–2.4)

## 2019-09-13 MED ORDER — ISOSORB DINITRATE-HYDRALAZINE 20-37.5 MG PO TABS
1.0000 | ORAL_TABLET | Freq: Three times a day (TID) | ORAL | Status: DC
  Administered 2019-09-14 (×2): 1 via ORAL
  Filled 2019-09-13 (×4): qty 1

## 2019-09-13 MED ORDER — FUROSEMIDE 10 MG/ML IJ SOLN: 40.0000 mg | Freq: Two times a day (BID) | INTRAMUSCULAR | Status: DC

## 2019-09-13 NOTE — Progress Notes (Signed)
PROGRESS NOTE    Patrick Stewart  [id number] DOB: 06-01-1956 DOA: 09/10/2019 PCP: Shelda Pal, DO   Brief Narrative:  Pt. with PMH of chronic systolic CHF, AICD placement, CAD S/P CABG, chronic renal disease stage III, hyperlipidemia, type II DM; presented with complain of shortness of breath and severe right knee pain, was found to have acute on chronic systolic CHF as well as intra-articular air and marginal erosion in right knee.  Currently further plan is continue diuresis and follow-up on recommendation of orthopedics.   Assessment & Plan:   Active Problems:   CHF exacerbation (HCC)   Acute systolic CHF (congestive heart failure), NYHA class 3 ([place])   1.  Acute on chronic combined systolic and diastolic CHF. S/P BiV CRT-D placement CAD CABG Patient presents with complaints of volume overload and shortness of breath. Significant exacerbation of his chronic CHF. Suspect poor compliance given his digoxin level being undetectable. Patient received IV Lasix in the ER and was started on IV Lasix GTT. Currently iatrogenic to IV Lasix 40 mg daily which was increased by cardiology to IV Lasix 40 mg twice daily given his significant volume overload but will hold today given cr rise We will monitor recommendation from cardiology and appreciate their assistance. Currently holding his Entresto Continue beta-blocker, continue the digoxin, added BiDil for afterload reduction. Renal function stable at 2.41 x 2, but elevated Refuses Unna boots.  2.  Acute kidney injury on chronic kidney disease stage III Serum creatinine 2.41.  BUN 61.  Chronically elevated with her baseline around 1.7-1.8. Currently receiving IV diuresis-hold as above. Likely cardiorenal hemodynamics although intravascular volume depletion and third spacing cannot be ruled out. Strict in and out Daily weight monitor closely. If the renal function worsens may require inotropic support. We will provide IV  albumin.  3.  Anasarca, ascites, edema Grossly volume overloaded generalized edema as well as ascites. Currently no indication for paracentesis for now. Likely secondary to CHF. Also lower extremity Doppler negative for any DVT. Monitor response with diuresis. Refuses Unna boots. received IV albumin.  4. Type 2 Diabetes Mellitus, moderately controled with neuropathic and renal complication last hemoglobin A1c was 6.8 in July 2020. Blood glucose well  -hold his oral hypoglycemic agents. -On insulin sliding scale moderate.  5.  Severe right knee pain Severe osteoarthritis will Patient reports nontraumatic severe right knee pain which started since 09/10/2019. X-ray shows evidence of effusion. No further etiology identified. Patient had out of proportion pain to examination and was not allowing active or passive movement. Initial plan was to obtain an MRI but due to patient's ICD MRI is not possible. CT hip and CT knee were performed since the patient reported pain primarily radiating from his hip. Orthopedic is consulted. We will follow-up on recommendation. There is a concern about intra-articular area with suspected secondary to arthrocentesis. Orthopedic perform intra-articular steroid injection. Discontinue colchicine. Suspect this is secondary to severe osteoarthritis and patient will benefit from outpatient orthopedic follow-up.  6.  CAD Hyperlipidemia Continue 81 mg aspirin, 80 mg Lipitor  7.  Diabetic neuropathy Continue Cymbalta, continue gabapentin, continue nortriptyline, PRN oxycodone for pain control.  8.  Hypokalemia. Replaced. Monitor.  9.  Anemia of chronic disease. Baseline serum hemoglobin around 11. Currently stable.  No active bleeding.  Monitor.  10.  PVC./Short NSVT Secondary to cardiomyopathy. S/P AICD placement Currently on beta-blocker. Monitor.  DVT prophylaxis: Lovenox SQ  Code Status: full    Code Status Orders  (From  admission, onward)  Start     Ordered   09/10/19 1019  Full code  Continuous     09/10/19 1018        Code Status History    Date Active Date Inactive Code Status Order ID Comments User Context   06/05/2019 0748 06/10/2019 1411 Full Code [id number]  Mendel Corning, MD Inpatient   07/16/2018 0414 07/17/2018 1859 Full Code [id number]  Vianne Bulls, MD Inpatient   10/21/2017 1110 10/22/2017 1457 Full Code [id number]  Constance Haw, MD Inpatient   07/28/2017 0837 07/28/2017 2353 Full Code [id number]  Rondel Jumbo, PA-C Inpatient   07/12/2017 2128 07/14/2017 1729 Full Code [id number]  Vianne Bulls, MD ED   04/11/2017 1157 04/15/2017 1902 Full Code [id number]  Sanda Klein, MD Inpatient   09/30/2016 0048 10/02/2016 1921 Full Code [id number]  Karmen Bongo, MD Inpatient   10/13/2015 0049 10/14/2015 1631 Full Code [id number]  Lavina Hamman, MD ED   Advance Care Planning Activity     Family Communication: none today  Disposition Plan:   Patient will remain inpatient for continued IV diuresis and subspecialty evaluation with cardiology Consults called: None Admission status: Inpatient   Consultants:   cardiology  Procedures:  Ct Knee Right Wo Contrast  Result Date: 09/11/2019 CLINICAL DATA:  Right knee pain EXAM: CT OF THE RIGHT KNEE WITHOUT CONTRAST TECHNIQUE: Multidetector CT imaging of the RIGHT knee was performed according to the standard protocol. Multiplanar CT image reconstructions were also generated. COMPARISON:  X-ray 09/10/2019 FINDINGS: Bones/Joint/Cartilage No acute fracture or dislocation. Moderate tricompartmental osteoarthritis manifested by joint space narrowing and marginal osteophyte formation. Suggestion of marginal erosions involving the knee, most notably at the medial tibial plateau (series 5, image 83) as well as probable involvement of the medial and lateral femoral condyles including the popliteal hiatus (series 5, image 66). Chondrocalcinosis of the  menisci. Small to moderate sized joint effusion with thickened appearance of the joint capsule. There is a small amount of intra-articular air within the knee joint. Ligaments Suboptimally assessed by CT. Muscles and Tendons Relatively preserved muscle bulk without significant atrophy or fatty infiltration. Tendinous structures grossly intact. Soft tissues Mild nonspecific edema within the superficial soft tissues of the visualized calf. Just posterior to the pes anserine tendon insertion is a well circumscribed nodule measuring 1.4 x 1.1 x 1.3 cm (series 6, image 118; series 9, image 57). IMPRESSION: 1. No acute fracture or malalignment of the right knee. 2. Small to moderate knee joint effusion with intra-articular air. Correlate for any recent joint injection/aspiration as these findings could be iatrogenic. Alternatively, in the setting of trauma this could reflect traumatic arthrotomy. In the absence of recent trauma or instrumentation, a gas-forming infection must be considered. 3. Findings suggestive of marginal erosions involving the distal femur and tibial plateau, which raise the possibility of a crystalline arthropathy such as gout. 4. Nonspecific 1.4 cm nodule within the posteromedial soft tissues of the proximal calf near the pes anserine tendon insertion. Consider follow-up with a nonemergent MRI of the knee with and without intravenous contrast. These results will be called to the ordering clinician or representative by the Radiologist Assistant, and communication documented in the PACS or zVision Dashboard. Electronically Signed   By: Davina Poke M.D.   On: 09/11/2019 17:23   US Renal  Result Date: 09/12/2019 CLINICAL DATA:  63 year old male with acute renal insufficiency. History of hypertension, and diabetes. EXAM: RENAL / URINARY TRACT ULTRASOUND COMPLETE COMPARISON:  CT of the abdomen pelvis  dated 02/19/2018. FINDINGS: Right Kidney: Renal measurements: 10.0 x 4.4 x 4.3 cm = volume: 99  mL. There is diffuse increased renal echogenicity. No hydronephrosis or shadowing stone. Left Kidney: Renal measurements: 9.6 x 4.7 x 4.9 cm = volume: 114 mL. There is diffuse increased renal echogenicity. No hydronephrosis or shadowing stone. Bladder: Appears normal for degree of bladder distention. There is a small ascites. Mild lobulation of the visualized liver contour may represent early cirrhosis. Clinical correlation is recommended. IMPRESSION: 1. Echogenic kidneys in keeping with chronic kidney disease. No hydronephrosis or shadowing stone. 2. Small ascites. Electronically Signed   By: Anner Crete M.D.   On: 09/12/2019 13:21   Ct Hip Right Wo Contrast  Result Date: 09/11/2019 CLINICAL DATA:  Right hip pain, AVN suspected. EXAM: CT OF THE RIGHT HIP WITHOUT CONTRAST TECHNIQUE: Multidetector CT imaging of the right hip was performed according to the standard protocol. Multiplanar CT image reconstructions were also generated. COMPARISON:  X-rays 07/15/2019, CT 10/13/2015 FINDINGS: Bones/Joint/Cartilage No acute fracture. No malalignment. No evidence of femoral head AVN. Femoral head contour is maintained. Mild right hip osteoarthritis with prominent marginal osteophyte formation. Unchanged appearance of 1.7 cm lucent lesion with sclerotic margins within the intertrochanteric aspect of the proximal right femur, stable compared to 10/13/2015 and is benign. No new or suspicious osseous lesion. Trace right hip joint effusion. Ligaments Suboptimally assessed by CT. Muscles and Tendons Preserved muscle bulk without significant atrophy or fatty infiltration. Tendinous structures grossly intact, suboptimally evaluated. Soft tissues Mild subcutaneous edema predominantly involving the anterolateral aspect of the right hip and proximal femur. Moderate volume of ascites within the visualized portion of the pelvis. Vascular calcifications noted. Fat containing right inguinal hernia. IMPRESSION: 1. Negative for right  hip fracture or dislocation. No CT evidence to suggest avascular necrosis. 2. Mild right hip osteoarthritis. 3. Trace right hip joint effusion, which is nonspecific. If there is high clinical concern for septic arthritis, consider arthrocentesis. 4. Nonspecific subcutaneous edema overlying the anterior and anterolateral aspects of the right hip and thigh. Findings could be related to fluid overload versus cellulitis in the appropriate clinical setting. 5. Moderate volume ascites within the visualized pelvis. Electronically Signed   By: Davina Poke M.D.   On: 09/11/2019 17:31   Dg Chest Port 1 View  Result Date: 09/10/2019 CLINICAL DATA:  Shortness of breath. EXAM: PORTABLE CHEST 1 VIEW COMPARISON:  08/26/2019 FINDINGS: Multi lead left-sided pacemaker in place. Post median sternotomy. Chronic cardiomegaly, unchanged from prior exam. No acute airspace disease, pulmonary edema, pleural fluid or pneumothorax. IMPRESSION: Post CABG with chronic cardiomegaly.  No acute abnormality. Electronically Signed   By: Keith Rake M.D.   On: 09/10/2019 03:17   Dg Knee Complete 4 Views Right  Result Date: 09/10/2019 CLINICAL DATA:  Right knee buckled last night with lateral/anterior pain since. EXAM: RIGHT KNEE - COMPLETE 4+ VIEW COMPARISON:  06/12/2019 FINDINGS: There is mild tricompartmental osteoarthritis worse over the patellofemoral joint. Small joint effusion. No evidence of fracture or dislocation. IMPRESSION: No acute findings. Mild osteoarthritis with small joint effusion. Electronically Signed   By: Marin Olp M.D.   On: 09/10/2019 09:30   Vas [place] Lower Extremity Venous (dvt)  Result Date: 09/12/2019  Lower Venous Study Indications: Edema, and Pain.  Risk Factors: None identified. Limitations: Poor ultrasound/tissue interface and patient positioning, patient pain tolerance. Comparison Study: No prior studies. Performing Technologist: Oliver Hum RVT  Examination Guidelines: A complete evaluation  includes B-mode imaging, spectral Doppler, color Doppler, and power  Doppler as needed of all accessible portions of each vessel. Bilateral testing is considered an integral part of a complete examination. Limited examinations for reoccurring indications may be performed as noted.  +---------+---------------+---------+-----------+----------+--------------+  RIGHT     Compressibility Phasicity Spontaneity Properties Thrombus Aging  +---------+---------------+---------+-----------+----------+--------------+  CFV       Full            Yes       Yes                                    +---------+---------------+---------+-----------+----------+--------------+  SFJ       Full                                                             +---------+---------------+---------+-----------+----------+--------------+  FV Prox   Full                                                             +---------+---------------+---------+-----------+----------+--------------+  FV Mid    Full                                                             +---------+---------------+---------+-----------+----------+--------------+  FV Distal Full                                                             +---------+---------------+---------+-----------+----------+--------------+  PFV       Full                                                             +---------+---------------+---------+-----------+----------+--------------+  POP       Full            Yes       Yes                                    +---------+---------------+---------+-----------+----------+--------------+  PTV       Full                                                             +---------+---------------+---------+-----------+----------+--------------+  PERO      Full                                                             +---------+---------------+---------+-----------+----------+--------------+    +---------+---------------+---------+-----------+----------+--------------+  LEFT      Compressibility Phasicity Spontaneity Properties Thrombus Aging  +---------+---------------+---------+-----------+----------+--------------+  CFV       Full            Yes       Yes                                    +---------+---------------+---------+-----------+----------+--------------+  SFJ       Full                                                             +---------+---------------+---------+-----------+----------+--------------+  FV Prox   Full                                                             +---------+---------------+---------+-----------+----------+--------------+  FV Mid    Full                                                             +---------+---------------+---------+-----------+----------+--------------+  FV Distal Full                                                             +---------+---------------+---------+-----------+----------+--------------+  PFV       Full                                                             +---------+---------------+---------+-----------+----------+--------------+  POP       Full            Yes       Yes                                    +---------+---------------+---------+-----------+----------+--------------+  PTV       Full                                                             +---------+---------------+---------+-----------+----------+--------------+  PERO      Full                                                             +---------+---------------+---------+-----------+----------+--------------+  Summary: Right: There is no evidence of deep vein thrombosis in the lower extremity. However, portions of this examination were limited- see technologist comments above. No cystic structure found in the popliteal fossa. Left: There is no evidence of deep vein thrombosis in the lower extremity. However, portions of this examination were limited-  see technologist comments above. No cystic structure found in the popliteal fossa.  *See table(s) above for measurements and observations. Electronically signed by Ruta Hinds MD on 09/12/2019 at 8:03:51 AM.    Final      Antimicrobials:   none   Subjective: No acute events overnight   Objective: Vitals:   09/13/19 0549 09/13/19 1258 09/13/19 1310 09/13/19 1355  BP: 104/68 (!) 82/58 (!) 82/50 (!) 90/56  Pulse: 69 69 71   Resp: 16 18    Temp: (!) 97.5 F (36.4 C) 98 F (36.7 C)    TempSrc: Oral Oral    SpO2: 93% 96%    Weight:      Height:        Intake/Output Summary (Last 24 hours) at 09/13/2019 1539 Last data filed at 09/13/2019 1000 Gross per 24 hour  Intake 530 ml  Output 1375 ml  Net -845 ml   Filed Weights   09/11/19 0500 09/12/19 0500 09/13/19 0500  Weight: 83.8 kg 81.8 kg 82.2 kg    Examination:  General exam: Appears calm and comfortable  Respiratory system: Clear to auscultation. Respiratory effort normal. Cardiovascular system: S1 & S2 heard, RRR. No JVD, murmurs, rubs, gallops or clicks. No pedal edema. Gastrointestinal system: Abdomen is nondistended, soft and nontender. No organomegaly or masses felt. Normal bowel sounds heard. Central nervous system: Alert and oriented. No focal neurological deficits. Extremities: Lower extremities with 1+ pitting edema bilaterally. Skin: No rashes, lesions or ulcers Psychiatry: Judgement and insight appear normal. Mood & affect appropriate.     Data Reviewed: I have personally reviewed following labs and imaging studies  CBC: Recent Labs  Lab 09/10/19 0237 09/13/19 0347  WBC 4.8 9.8  NEUTROABS 2.9 8.8*  HGB 11.7* 10.7*  HCT 37.3* 33.3*  MCV 94.7 92.8  PLT 199 [id number]   Basic Metabolic Panel: Recent Labs  Lab 09/10/19 0237 09/11/19 0509 09/12/19 0402 09/12/19 1619 09/13/19 0347  NA 135 135 135 134* 130*  K 3.6 3.2* 4.0 4.4 4.3  CL 96* 93* 92* 92* 91*  CO2 27 33* 29 30 27   GLUCOSE 143* 123* 161*  188* 196*  BUN 30* 30* 45* 54* 61*  CREATININE 1.56* 1.67* 2.10* 2.41* 2.41*  CALCIUM 8.8* 8.5* 8.4* 8.1* 8.2*  MG  --   --   --  1.7 1.8   GFR: Estimated Creatinine Clearance: 32.4 mL/min (A) (by C-G formula based on SCr of 2.41 mg/dL (H)). Liver Function Tests: Recent Labs  Lab 09/10/19 0237 09/13/19 0347  AST 25 17  ALT 13 10  ALKPHOS 104 82  BILITOT 2.4* 1.5*  PROT 8.2* 6.3*  ALBUMIN 3.0* 2.3*   No results for input(s): LIPASE, AMYLASE in the last 168 hours. No results for input(s): AMMONIA in the last 168 hours. Coagulation Profile: No results for input(s): INR, PROTIME in the last 168 hours. Cardiac Enzymes: No results for input(s): CKTOTAL, CKMB, CKMBINDEX, TROPONINI in the last 168 hours. BNP (last 3 results) No results for input(s): PROBNP in the last 8760 hours. HbA1C: No results for input(s): HGBA1C in the last 72 hours. CBG: Recent Labs  Lab 09/12/19 1150 09/12/19 1639 09/12/19 2218 09/13/19 [id number] 09/13/19  1144  GLUCAP 186* 187* 204* 176* 215*   Lipid Profile: No results for input(s): CHOL, HDL, LDLCALC, TRIG, CHOLHDL, LDLDIRECT in the last 72 hours. Thyroid Function Tests: No results for input(s): TSH, T4TOTAL, FREET4, T3FREE, THYROIDAB in the last 72 hours. Anemia Panel: No results for input(s): VITAMINB12, FOLATE, FERRITIN, TIBC, IRON, RETICCTPCT in the last 72 hours. Sepsis Labs: No results for input(s): PROCALCITON, LATICACIDVEN in the last 168 hours.  Recent Results (from the past 240 hour(s))  SARS Coronavirus 2 [facility] order, Performed in [facility] hospital lab) Nasopharyngeal Nasopharyngeal Swab     Status: None   Collection Time: 09/10/19  4:53 AM   Specimen: Nasopharyngeal Swab  Result Value Ref Range Status   SARS Coronavirus 2 NEGATIVE NEGATIVE Final    Comment: (NOTE) If result is NEGATIVE SARS-CoV-2 target nucleic acids are NOT DETECTED. The SARS-CoV-2 RNA is generally detectable in upper and lower  respiratory specimens during  the acute phase of infection. The lowest  concentration of SARS-CoV-2 viral copies this assay can detect is 250  copies / mL. A negative result does not preclude SARS-CoV-2 infection  and should not be used as the sole basis for treatment or other  patient management decisions.  A negative result may occur with  improper specimen collection / handling, submission of specimen other  than nasopharyngeal swab, presence of viral mutation(s) within the  areas targeted by this assay, and inadequate number of viral copies  (<250 copies / mL). A negative result must be combined with clinical  observations, patient history, and epidemiological information. If result is POSITIVE SARS-CoV-2 target nucleic acids are DETECTED. The SARS-CoV-2 RNA is generally detectable in upper and lower  respiratory specimens dur ing the acute phase of infection.  Positive  results are indicative of active infection with SARS-CoV-2.  Clinical  correlation with patient history and other diagnostic information is  necessary to determine patient infection status.  Positive results do  not rule out bacterial infection or co-infection with other viruses. If result is PRESUMPTIVE POSTIVE SARS-CoV-2 nucleic acids MAY BE PRESENT.   A presumptive positive result was obtained on the submitted specimen  and confirmed on repeat testing.  While 2019 novel coronavirus  (SARS-CoV-2) nucleic acids may be present in the submitted sample  additional confirmatory testing may be necessary for epidemiological  and / or clinical management purposes  to differentiate between  SARS-CoV-2 and other Sarbecovirus currently known to infect humans.  If clinically indicated additional testing with an alternate test  methodology [phone]) is advised. The SARS-CoV-2 RNA is generally  detectable in upper and lower respiratory sp ecimens during the acute  phase of infection. The expected result is Negative. Fact Sheet for Patients:   StrictlyIdeas.no Fact Sheet for Healthcare Providers: BankingDealers.co.za This test is not yet approved or cleared by the [place] FDA and has been authorized for detection and/or diagnosis of SARS-CoV-2 by FDA under an Emergency Use Authorization (EUA).  This EUA will remain in effect (meaning this test can be used) for the duration of the COVID-19 declaration under Section 564(b)(1) of the Act, 21 U.S.C. section 360bbb-3(b)(1), unless the authorization is terminated or revoked sooner. Performed at [facility], [place] [address] [place], [place] [address]          Radiology Studies: Ct Knee Right Wo Contrast  Result Date: 09/11/2019 CLINICAL DATA:  Right knee pain EXAM: CT OF THE RIGHT KNEE WITHOUT CONTRAST TECHNIQUE: Multidetector CT imaging of the RIGHT knee was performed according  to the standard protocol. Multiplanar CT image reconstructions were also generated. COMPARISON:  X-ray 09/10/2019 FINDINGS: Bones/Joint/Cartilage No acute fracture or dislocation. Moderate tricompartmental osteoarthritis manifested by joint space narrowing and marginal osteophyte formation. Suggestion of marginal erosions involving the knee, most notably at the medial tibial plateau (series 5, image 83) as well as probable involvement of the medial and lateral femoral condyles including the popliteal hiatus (series 5, image 66). Chondrocalcinosis of the menisci. Small to moderate sized joint effusion with thickened appearance of the joint capsule. There is a small amount of intra-articular air within the knee joint. Ligaments Suboptimally assessed by CT. Muscles and Tendons Relatively preserved muscle bulk without significant atrophy or fatty infiltration. Tendinous structures grossly intact. Soft tissues Mild nonspecific edema within the superficial soft tissues of the visualized calf. Just posterior to the pes anserine tendon insertion is a  well circumscribed nodule measuring 1.4 x 1.1 x 1.3 cm (series 6, image 118; series 9, image 57). IMPRESSION: 1. No acute fracture or malalignment of the right knee. 2. Small to moderate knee joint effusion with intra-articular air. Correlate for any recent joint injection/aspiration as these findings could be iatrogenic. Alternatively, in the setting of trauma this could reflect traumatic arthrotomy. In the absence of recent trauma or instrumentation, a gas-forming infection must be considered. 3. Findings suggestive of marginal erosions involving the distal femur and tibial plateau, which raise the possibility of a crystalline arthropathy such as gout. 4. Nonspecific 1.4 cm nodule within the posteromedial soft tissues of the proximal calf near the pes anserine tendon insertion. Consider follow-up with a nonemergent MRI of the knee with and without intravenous contrast. These results will be called to the ordering clinician or representative by the Radiologist Assistant, and communication documented in the PACS or zVision Dashboard. Electronically Signed   By: Davina Poke M.D.   On: 09/11/2019 17:23   US Renal  Result Date: 09/12/2019 CLINICAL DATA:  63 year old male with acute renal insufficiency. History of hypertension, and diabetes. EXAM: RENAL / URINARY TRACT ULTRASOUND COMPLETE COMPARISON:  CT of the abdomen pelvis dated 02/19/2018. FINDINGS: Right Kidney: Renal measurements: 10.0 x 4.4 x 4.3 cm = volume: 99 mL. There is diffuse increased renal echogenicity. No hydronephrosis or shadowing stone. Left Kidney: Renal measurements: 9.6 x 4.7 x 4.9 cm = volume: 114 mL. There is diffuse increased renal echogenicity. No hydronephrosis or shadowing stone. Bladder: Appears normal for degree of bladder distention. There is a small ascites. Mild lobulation of the visualized liver contour may represent early cirrhosis. Clinical correlation is recommended. IMPRESSION: 1. Echogenic kidneys in keeping with chronic  kidney disease. No hydronephrosis or shadowing stone. 2. Small ascites. Electronically Signed   By: Anner Crete M.D.   On: 09/12/2019 13:21   Ct Hip Right Wo Contrast  Result Date: 09/11/2019 CLINICAL DATA:  Right hip pain, AVN suspected. EXAM: CT OF THE RIGHT HIP WITHOUT CONTRAST TECHNIQUE: Multidetector CT imaging of the right hip was performed according to the standard protocol. Multiplanar CT image reconstructions were also generated. COMPARISON:  X-rays 07/15/2019, CT 10/13/2015 FINDINGS: Bones/Joint/Cartilage No acute fracture. No malalignment. No evidence of femoral head AVN. Femoral head contour is maintained. Mild right hip osteoarthritis with prominent marginal osteophyte formation. Unchanged appearance of 1.7 cm lucent lesion with sclerotic margins within the intertrochanteric aspect of the proximal right femur, stable compared to 10/13/2015 and is benign. No new or suspicious osseous lesion. Trace right hip joint effusion. Ligaments Suboptimally assessed by CT. Muscles and Tendons Preserved muscle bulk  without significant atrophy or fatty infiltration. Tendinous structures grossly intact, suboptimally evaluated. Soft tissues Mild subcutaneous edema predominantly involving the anterolateral aspect of the right hip and proximal femur. Moderate volume of ascites within the visualized portion of the pelvis. Vascular calcifications noted. Fat containing right inguinal hernia. IMPRESSION: 1. Negative for right hip fracture or dislocation. No CT evidence to suggest avascular necrosis. 2. Mild right hip osteoarthritis. 3. Trace right hip joint effusion, which is nonspecific. If there is high clinical concern for septic arthritis, consider arthrocentesis. 4. Nonspecific subcutaneous edema overlying the anterior and anterolateral aspects of the right hip and thigh. Findings could be related to fluid overload versus cellulitis in the appropriate clinical setting. 5. Moderate volume ascites within the  visualized pelvis. Electronically Signed   By: Davina Poke M.D.   On: 09/11/2019 17:31        Scheduled Meds:  aspirin EC  81 mg Oral Daily   atorvastatin  80 mg Oral Daily   carvedilol  12.5 mg Oral BID WC   digoxin  125 mcg Oral Daily   DULoxetine  20 mg Oral Daily   enoxaparin (LOVENOX) injection  40 mg Subcutaneous Daily   [START ON 09/14/2019] furosemide  40 mg Intravenous BID   gabapentin  600 mg Oral BID   influenza vac split quadrivalent PF  0.5 mL Intramuscular Tomorrow-1000   insulin aspart  0-15 Units Subcutaneous TID WC   insulin aspart  0-5 Units Subcutaneous QHS   isosorbide-hydrALAZINE  2 tablet Oral TID   lidocaine  2 patch Transdermal Q24H   lidocaine (PF)  5 mL Other Once   methylPREDNISolone acetate  80 mg Intra-articular Once   nortriptyline  10 mg Oral QHS   pneumococcal 23 valent vaccine  0.5 mL Intramuscular Tomorrow-1000   sodium chloride flush  3 mL Intravenous Q12H   Continuous Infusions:  sodium chloride       LOS: 3 days    Time spent: 35 min    Nicolette Bang, MD [facility]  If 7PM-7AM, please contact night-coverage  09/13/2019, 3:39 PM

## 2019-09-13 NOTE — Progress Notes (Signed)
Progress Note  Patient Name: Patrick Stewart Date of Encounter: 09/13/2019  Primary Cardiologist: Loralie Champagne, MD   Subjective   Pt states he feels 100% better today.  Inpatient Medications    Scheduled Meds:  aspirin EC  81 mg Oral Daily   atorvastatin  80 mg Oral Daily   carvedilol  12.5 mg Oral BID WC   digoxin  125 mcg Oral Daily   DULoxetine  20 mg Oral Daily   enoxaparin (LOVENOX) injection  40 mg Subcutaneous Daily   furosemide  40 mg Intravenous BID   gabapentin  600 mg Oral BID   influenza vac split quadrivalent PF  0.5 mL Intramuscular Tomorrow-1000   insulin aspart  0-15 Units Subcutaneous TID WC   insulin aspart  0-5 Units Subcutaneous QHS   isosorbide-hydrALAZINE  2 tablet Oral TID   lidocaine  2 patch Transdermal Q24H   lidocaine (PF)  5 mL Other Once   methylPREDNISolone acetate  80 mg Intra-articular Once   nortriptyline  10 mg Oral QHS   pneumococcal 23 valent vaccine  0.5 mL Intramuscular Tomorrow-1000   sodium chloride flush  3 mL Intravenous Q12H   Continuous Infusions:  sodium chloride     PRN Meds: sodium chloride, acetaminophen, albuterol, ondansetron (ZOFRAN) IV, oxyCODONE-acetaminophen, sodium chloride flush   Vital Signs    Vitals:   09/12/19 1317 09/12/19 2019 09/13/19 0500 09/13/19 0549  BP: (!) 83/55 101/66  104/68  Pulse: 73 70  69  Resp: 20 18  16   Temp: 97.6 F (36.4 C) 97.8 F (36.6 C)  (!) 97.5 F (36.4 C)  TempSrc: Oral Oral  Oral  SpO2: 95% 92%  93%  Weight:   82.2 kg   Height:        Intake/Output Summary (Last 24 hours) at 09/13/2019 0739 Last data filed at 09/13/2019 0540 Gross per 24 hour  Intake 1250 ml  Output 600 ml  Net 650 ml   Last 3 Weights 09/13/2019 09/12/2019 09/11/2019  Weight (lbs) 181 lb 3.5 oz 180 lb 5.4 oz 184 lb 11.9 oz  Weight (kg) 82.2 kg 81.8 kg 83.8 kg      Telemetry    V-pacing HR 70 - Personally Reviewed  ECG    No new tracings - Personally Reviewed  Physical Exam    GEN: No acute distress.   Neck: + JVD - improving Cardiac: RRR, no murmurs, rubs, or gallops.  Respiratory: respirations unlabored, diminished in right base GI: Soft, nontender, non-distended  MS: improving LE edema; No deformity. Neuro:  Nonfocal  Psych: Normal affect   Labs    High Sensitivity Troponin:   Recent Labs  Lab 09/10/19 0237 09/10/19 0445  TROPONINIHS 52* 53*      Chemistry Recent Labs  Lab 09/10/19 0237  09/12/19 0402 09/12/19 1619 09/13/19 0347  NA 135   < > 135 134* 130*  K 3.6   < > 4.0 4.4 4.3  CL 96*   < > 92* 92* 91*  CO2 27   < > 29 30 27   GLUCOSE 143*   < > 161* 188* 196*  BUN 30*   < > 45* 54* 61*  CREATININE 1.56*   < > 2.10* 2.41* 2.41*  CALCIUM 8.8*   < > 8.4* 8.1* 8.2*  PROT 8.2*  --   --   --  6.3*  ALBUMIN 3.0*  --   --   --  2.3*  AST 25  --   --   --  17  ALT 13  --   --   --  10  ALKPHOS 104  --   --   --  82  BILITOT 2.4*  --   --   --  1.5*  GFRNONAA 47*   < > 33* 28* 28*  GFRAA 54*   < > 38* 32* 32*  ANIONGAP 12   < > 14 12 12    < > = values in this interval not displayed.     Hematology Recent Labs  Lab 09/10/19 0237 09/13/19 0347  WBC 4.8 9.8  RBC 3.94* 3.59*  HGB 11.7* 10.7*  HCT 37.3* 33.3*  MCV 94.7 92.8  MCH 29.7 29.8  MCHC 31.4 32.1  RDW 17.8* 17.7*  PLT 199 214    BNP Recent Labs  Lab 09/10/19 0237  BNP 2,672.7*     DDimer No results for input(s): DDIMER in the last 168 hours.   Radiology    Ct Knee Right Wo Contrast  Result Date: 09/11/2019 CLINICAL DATA:  Right knee pain EXAM: CT OF THE RIGHT KNEE WITHOUT CONTRAST TECHNIQUE: Multidetector CT imaging of the RIGHT knee was performed according to the standard protocol. Multiplanar CT image reconstructions were also generated. COMPARISON:  X-ray 09/10/2019 FINDINGS: Bones/Joint/Cartilage No acute fracture or dislocation. Moderate tricompartmental osteoarthritis manifested by joint space narrowing and marginal osteophyte formation. Suggestion of  marginal erosions involving the knee, most notably at the medial tibial plateau (series 5, image 83) as well as probable involvement of the medial and lateral femoral condyles including the popliteal hiatus (series 5, image 66). Chondrocalcinosis of the menisci. Small to moderate sized joint effusion with thickened appearance of the joint capsule. There is a small amount of intra-articular air within the knee joint. Ligaments Suboptimally assessed by CT. Muscles and Tendons Relatively preserved muscle bulk without significant atrophy or fatty infiltration. Tendinous structures grossly intact. Soft tissues Mild nonspecific edema within the superficial soft tissues of the visualized calf. Just posterior to the pes anserine tendon insertion is a well circumscribed nodule measuring 1.4 x 1.1 x 1.3 cm (series 6, image 118; series 9, image 57). IMPRESSION: 1. No acute fracture or malalignment of the right knee. 2. Small to moderate knee joint effusion with intra-articular air. Correlate for any recent joint injection/aspiration as these findings could be iatrogenic. Alternatively, in the setting of trauma this could reflect traumatic arthrotomy. In the absence of recent trauma or instrumentation, a gas-forming infection must be considered. 3. Findings suggestive of marginal erosions involving the distal femur and tibial plateau, which raise the possibility of a crystalline arthropathy such as gout. 4. Nonspecific 1.4 cm nodule within the posteromedial soft tissues of the proximal calf near the pes anserine tendon insertion. Consider follow-up with a nonemergent MRI of the knee with and without intravenous contrast. These results will be called to the ordering clinician or representative by the Radiologist Assistant, and communication documented in the PACS or zVision Dashboard. Electronically Signed   By: Davina Poke M.D.   On: 09/11/2019 17:23   US Renal  Result Date: 09/12/2019 CLINICAL DATA:  63 year old male  with acute renal insufficiency. History of hypertension, and diabetes. EXAM: RENAL / URINARY TRACT ULTRASOUND COMPLETE COMPARISON:  CT of the abdomen pelvis dated 02/19/2018. FINDINGS: Right Kidney: Renal measurements: 10.0 x 4.4 x 4.3 cm = volume: 99 mL. There is diffuse increased renal echogenicity. No hydronephrosis or shadowing stone. Left Kidney: Renal measurements: 9.6 x 4.7 x 4.9 cm = volume: 114 mL. There is diffuse  increased renal echogenicity. No hydronephrosis or shadowing stone. Bladder: Appears normal for degree of bladder distention. There is a small ascites. Mild lobulation of the visualized liver contour may represent early cirrhosis. Clinical correlation is recommended. IMPRESSION: 1. Echogenic kidneys in keeping with chronic kidney disease. No hydronephrosis or shadowing stone. 2. Small ascites. Electronically Signed   By: Anner Crete M.D.   On: 09/12/2019 13:21   Ct Hip Right Wo Contrast  Result Date: 09/11/2019 CLINICAL DATA:  Right hip pain, AVN suspected. EXAM: CT OF THE RIGHT HIP WITHOUT CONTRAST TECHNIQUE: Multidetector CT imaging of the right hip was performed according to the standard protocol. Multiplanar CT image reconstructions were also generated. COMPARISON:  X-rays 07/15/2019, CT 10/13/2015 FINDINGS: Bones/Joint/Cartilage No acute fracture. No malalignment. No evidence of femoral head AVN. Femoral head contour is maintained. Mild right hip osteoarthritis with prominent marginal osteophyte formation. Unchanged appearance of 1.7 cm lucent lesion with sclerotic margins within the intertrochanteric aspect of the proximal right femur, stable compared to 10/13/2015 and is benign. No new or suspicious osseous lesion. Trace right hip joint effusion. Ligaments Suboptimally assessed by CT. Muscles and Tendons Preserved muscle bulk without significant atrophy or fatty infiltration. Tendinous structures grossly intact, suboptimally evaluated. Soft tissues Mild subcutaneous edema  predominantly involving the anterolateral aspect of the right hip and proximal femur. Moderate volume of ascites within the visualized portion of the pelvis. Vascular calcifications noted. Fat containing right inguinal hernia. IMPRESSION: 1. Negative for right hip fracture or dislocation. No CT evidence to suggest avascular necrosis. 2. Mild right hip osteoarthritis. 3. Trace right hip joint effusion, which is nonspecific. If there is high clinical concern for septic arthritis, consider arthrocentesis. 4. Nonspecific subcutaneous edema overlying the anterior and anterolateral aspects of the right hip and thigh. Findings could be related to fluid overload versus cellulitis in the appropriate clinical setting. 5. Moderate volume ascites within the visualized pelvis. Electronically Signed   By: Davina Poke M.D.   On: 09/11/2019 17:31   Vas [place] Lower Extremity Venous (dvt)  Result Date: 09/12/2019  Lower Venous Study Indications: Edema, and Pain.  Risk Factors: None identified. Limitations: Poor ultrasound/tissue interface and patient positioning, patient pain tolerance. Comparison Study: No prior studies. Performing Technologist: Oliver Hum RVT  Examination Guidelines: A complete evaluation includes B-mode imaging, spectral Doppler, color Doppler, and power Doppler as needed of all accessible portions of each vessel. Bilateral testing is considered an integral part of a complete examination. Limited examinations for reoccurring indications may be performed as noted.  +---------+---------------+---------+-----------+----------+--------------+  RIGHT     Compressibility Phasicity Spontaneity Properties Thrombus Aging  +---------+---------------+---------+-----------+----------+--------------+  CFV       Full            Yes       Yes                                    +---------+---------------+---------+-----------+----------+--------------+  SFJ       Full                                                              +---------+---------------+---------+-----------+----------+--------------+  FV Prox   Full                                                             +---------+---------------+---------+-----------+----------+--------------+  FV Mid    Full                                                             +---------+---------------+---------+-----------+----------+--------------+  FV Distal Full                                                             +---------+---------------+---------+-----------+----------+--------------+  PFV       Full                                                             +---------+---------------+---------+-----------+----------+--------------+  POP       Full            Yes       Yes                                    +---------+---------------+---------+-----------+----------+--------------+  PTV       Full                                                             +---------+---------------+---------+-----------+----------+--------------+  PERO      Full                                                             +---------+---------------+---------+-----------+----------+--------------+   +---------+---------------+---------+-----------+----------+--------------+  LEFT      Compressibility Phasicity Spontaneity Properties Thrombus Aging  +---------+---------------+---------+-----------+----------+--------------+  CFV       Full            Yes       Yes                                    +---------+---------------+---------+-----------+----------+--------------+  SFJ       Full                                                             +---------+---------------+---------+-----------+----------+--------------+  FV Prox   Full                                                             +---------+---------------+---------+-----------+----------+--------------+  FV Mid    Full                                                              +---------+---------------+---------+-----------+----------+--------------+  FV Distal Full                                                             +---------+---------------+---------+-----------+----------+--------------+  PFV       Full                                                             +---------+---------------+---------+-----------+----------+--------------+  POP       Full            Yes       Yes                                    +---------+---------------+---------+-----------+----------+--------------+  PTV       Full                                                             +---------+---------------+---------+-----------+----------+--------------+  PERO      Full                                                             +---------+---------------+---------+-----------+----------+--------------+     Summary: Right: There is no evidence of deep vein thrombosis in the lower extremity. However, portions of this examination were limited- see technologist comments above. No cystic structure found in the popliteal fossa. Left: There is no evidence of deep vein thrombosis in the lower extremity. However, portions of this examination were limited- see technologist comments above. No cystic structure found in the popliteal fossa.  *See table(s) above for measurements and observations. Electronically signed by Ruta Hinds MD on 09/12/2019 at 8:03:51 AM.    Final     Cardiac Studies   Echo 09/12/19:  1. The left ventricle has severely reduced systolic function, with an ejection fraction of 20-25%. The cavity size was mildly dilated. There is moderately increased left ventricular wall thickness. Left ventricular diastolic Doppler parameters are  consistent with impaired relaxation. Elevated left atrial and left ventricular end-diastolic pressures The E/e' is >15. There is abnormal septal motion consistent with RV pacemaker. Left ventricular diffuse hypokinesis.  2. The right ventricle has  moderately reduced systolic function. The cavity was mildly enlarged. There is no increase in right ventricular wall thickness.  3. Left atrial size was moderately dilated.  4. The mitral valve is grossly normal.  5. The tricuspid valve is grossly normal.  6. The aortic valve is tricuspid. Mild thickening of the aortic valve. Aortic valve regurgitation is mild by color flow Doppler. No stenosis of the aortic valve.  7. The aorta is normal unless otherwise noted.  8. The inferior vena cava was dilated in size with >50% respiratory variability.   Echocardiogram 06/05/2019 IMPRESSIONS 1. The left ventricle has severely reduced systolic function, with an ejection fraction of 20-25%. The cavity size was mildly dilated. There is mildly increased left ventricular wall thickness. Left ventricular diastolic Doppler parameters are  consistent with pseudonormalization. Inferior and inferoseptal akinesis, the other wall segments are hypokinetic. 2. The right ventricle has moderately reduced systolic function. The cavity was normal. There is no increase in right ventricular wall thickness. 3. Left atrial size was moderately dilated. 4. Right atrial size was mildly dilated. 5. Trivial pericardial effusion is present. 6. There is a pleural effusion present. 7. The aortic valve is tricuspid. Mild calcification of the aortic valve. Aortic valve regurgitation is moderate by color flow Doppler. No stenosis of the aortic valve. 8. The aortic root is normal in size and structure. 9. The inferior vena cava was dilated in size with <50% respiratory variability. PA systolic pressure 60 mmHg. 10. No evidence of mitral valve stenosis. Trivial mitral regurgitation  Patient Profile     63 y.o. male with a hx of chronic systolic and diastolic heart failure, ICD in place ([employer]), CAD s/p CABG (2010), DM, HTN, HLD, moderate pulmonary HTN, gout, and CKD stage IIIwho is admitted for the evaluation of CHF  exacerbation  Assessment & Plan    1. Acute on chronic systolic and diastolic hear failure 2. Ischemic cardiomyopathy 3. BiV CRT-D in place - scheduled for 40 mg IV lasix BID - has received 4 doses in addition to 80 mg IV lasix in ER - he is overall net negative 4L, although I&Os not complete for yesterday - weight is stable at 181 lbs from 188 lbs peak on admission - continue bidil TID, digoxin, and coreg - continue diuresis, hold tonight's dose of lasix for renal function - anticipate transitioning to PO torsemide and reinstating entresto in the next 48 hr depending on diuresis and renal function - will likely need a higher baseline dose of torsemide (was 20 mg BID at home)   4. Acute on chronic renal insufficiency stage III - sCr stable at 2.41 (2.41, 2.10) - baseline near 1.5-1.7 - K 4.3 - hold tonight's dose of lasix   5. Mild to moderate aortic regurgitation - continue to monitor   6. CAD s/p CABG x 2 in 2010 7, Hyperlipidemia - 06/09/2019: Cholesterol 137; HDL 24; LDL Cholesterol 100; Triglycerides 63; VLDL 13 - continue ASA and statin - patent grafts and stable disease by cath in 2018 - hs troponin consistent with demand ischemia in the setting of CHF exacerbation - no anginal complaints   8. COPD 9. Former smoker   40. Right knee pain - ortho aspirated 25 cc of normal fluid from right knee with subsequent steroid injection      For questions or updates, please contact [place] Please consult [email or web address] for contact info under        Signed, Ledora Bottcher, PA  09/13/2019, 7:39 AM

## 2019-09-13 NOTE — TOC Progression Note (Signed)
Transition of Care [facility]) - Progression Note    Patient Details  Name: Patrick Stewart MRN: [id number] Date of Birth: 08/07/1956  Transition of Care [facility]) CM/SW Contact  Purcell Mouton, RN Phone Number: 09/13/2019, 10:17 AM  Clinical Narrative:    Pt declined HH. States his wife is at home and will be alright when he gets home. There are no other needs.         Expected Discharge Plan and Services                                                 Social Determinants of Health (SDOH) Interventions    Readmission Risk Interventions No flowsheet data found.

## 2019-09-13 NOTE — Care Management Important Message (Signed)
Important Message  Patient Details IM Letter given to Cookie McGibboney RN to present to the Patient Name: Patrick Stewart MRN: [id number] Date of Birth: [birth year]   Medicare Important Message Given:  Yes     Patrick Stewart 09/13/2019, 10:52 AM

## 2019-09-13 NOTE — Plan of Care (Signed)
  Problem: Activity: °Goal: Risk for activity intolerance will decrease °Outcome: Progressing °  °Problem: Nutrition: °Goal: Adequate nutrition will be maintained °Outcome: Progressing °  °Problem: Elimination: °Goal: Will not experience complications related to bowel motility °Outcome: Progressing °  °Problem: Safety: °Goal: Ability to remain free from injury will improve °Outcome: Progressing °  °

## 2019-09-13 NOTE — Consult Note (Signed)
   [facility] CM Inpatient Consult   09/13/2019  Patrick Stewart 12-22-1956 [id number]   [facility] Follow up:  Spoke with [name] by telephone. HIPAA verified. Explained [place] [facility] services to patient. Explained that this is a service of his insurance plan and works with primary provider for community services. Patient states that he does not need anyone to help him when he gets home and does not want any follow up.   [name] declines [facility] CM services at this time.  Netta Cedars, MSN, [facility] Liaison Nurse Mobile Phone [phone]  Toll free office [phone]

## 2019-09-13 NOTE — Progress Notes (Signed)
1310: NT notified RN of low BP per dynamap. RN verified BP manually at 80/52, HR 71. Patient asymptomatic. RN notified Dr. Wyonia Hough, no new orders at this time except to continue to monitor patient and to check BP and discuss with MD prior to giving PM dose of IV lasix. At this same time, Cardiologist came to bedside to round on patient. RN made him aware of patient's BP. He stated that he had already planned to hold the PM dose of lasix. He also stated to monitor the patient and if he becomes symptomatic to make MD aware.    1340: Patient called out, after moving from sitting on the side of the bed while eating lunch to lying in the bed, stating he felt dizzy at that time. Another RN went to assess patient and checked BP via dynamap and SBP was 139. This RN was unsure of that accuracy given the previous BP 30 minutes earlier. Manual BP check was 90/56. Patient reported that the dizziness was subsiding. Will continue to monitor patient and notify MD PRN.   Kayle Passarelli, Fraser Din 09/13/2019

## 2019-09-14 ENCOUNTER — Telehealth (HOSPITAL_COMMUNITY): Payer: Self-pay | Admitting: *Deleted

## 2019-09-14 ENCOUNTER — Inpatient Hospital Stay (HOSPITAL_COMMUNITY): Payer: Medicare Other

## 2019-09-14 LAB — BASIC METABOLIC PANEL
Anion gap: 10 (ref 5–15)
BUN: 57 mg/dL — ABNORMAL HIGH (ref 8–23)
CO2: 30 mmol/L (ref 22–32)
Calcium: 8.4 mg/dL — ABNORMAL LOW (ref 8.9–10.3)
Chloride: 93 mmol/L — ABNORMAL LOW (ref 98–111)
Creatinine, Ser: 1.98 mg/dL — ABNORMAL HIGH (ref 0.61–1.24)
GFR calc Af Amer: 40 mL/min — ABNORMAL LOW (ref >60)
GFR calc non Af Amer: 35 mL/min — ABNORMAL LOW (ref >60)
Glucose, Bld: 170 mg/dL — ABNORMAL HIGH (ref 70–99)
Potassium: 4.1 mmol/L (ref 3.5–5.1)
Sodium: 133 mmol/L — ABNORMAL LOW (ref 135–145)

## 2019-09-14 LAB — GLUCOSE, CAPILLARY
Glucose-Capillary: 152 mg/dL — ABNORMAL HIGH (ref 70–99)
Glucose-Capillary: 161 mg/dL — ABNORMAL HIGH (ref 70–99)

## 2019-09-14 MED ORDER — ISOSORB DINITRATE-HYDRALAZINE 20-37.5 MG PO TABS: 1.0000 | ORAL_TABLET | Freq: Three times a day (TID) | ORAL | 0 refills | Status: AC

## 2019-09-14 MED ORDER — CARVEDILOL 12.5 MG PO TABS: 12.5000 mg | ORAL_TABLET | Freq: Two times a day (BID) | ORAL | 0 refills | Status: AC

## 2019-09-14 NOTE — Progress Notes (Signed)
Patient is stable for discharge. Discharge instructions and medications have been reviewed with the patient and all questions answered. AVS and prescriptions given to patient. Patient instructed to follow up with [facility] regarding appointment with Dr. Aundra Dubin. RN attempted to schedule follow up appointment twice but kept getting an answering service recording. Message left with them to call the patient to schedule an appointment.   Ramanda Paules, [place] 09/14/2019 3:24 PM

## 2019-09-14 NOTE — Telephone Encounter (Signed)
-----   Message from Consuelo Pandy, [place] sent at 09/14/2019 12:59 PM EDT ----- Regarding: post hospital f/u with [facility] or APP Pt being discharged from [facility] today. Sees DM. Will need f/u in 1 weeks in [facility]. Please call to arrange. Thank you!

## 2019-09-14 NOTE — Discharge Summary (Signed)
Physician Discharge Summary  Hiep Ripley [id number] DOB: [birth year] DOA: 09/10/2019  PCP: Shelda Pal, DO  Admit date: 09/10/2019 Discharge date: 09/14/2019  Admitted From: Inpatient Disposition: home  Recommendations for Outpatient Follow-up:  1. Follow up with PCP in 1-2 weeks 2. Please obtain BMP/CBC in one week 3. Please follow up on the following pending results:  Home Health:No Equipment/Devices:none  Discharge Condition:Stable CODE STATUS:Full code Diet recommendation: Diabetic diet  Brief/Interim Summary: Pt. with PMH of chronic systolic CHF, AICD placement, CAD S/P CABG, chronic renal disease stage III, hyperlipidemia, type II DM; presented with complain of shortness of breath and severe right knee pain, was found to have acute on chronic systolic CHF as well as intra-articular air and marginal erosion in right knee.   Hospital course: Acute on chronic combined systolic and diastolic congestive heart failure CAD s/p biventricular CRT-D placement Status post CABG Acute on chronic kidney disease stage III Anasarca Type 2 diabetes Severe right knee pain with osteoarthritis CAD Hyperlipidemia Diabetic neuropathy Hypokalemia Anemia of chronic disease History of nonsustained V. Tach  Patient was admitted by the hospital service he was aggressively diuresed the 7 L removed.  On the day of discharge patient had trace edema, clear chest x-ray no respiratory compromise.  He will resume his home p.o. medications.  He will restart his Entresto with close follow-up with his PCP for creatinine monitoring.  Continue with beta-blockers digoxin BiDil added for afterload reduction.  Renal function has improved during hospital stay.  Patient will need close follow-up with cardiology which was discussed with him.  As noted above his renal function was also monitored with improvement to baseline.  Patient's acute injury is likely second to cardiorenal syndrome complicated  by intravascular volume depletion and third spacing.  These have improved but again will require continued close monitoring. Patient last hemoglobin A1c was 6.8 shows diabetes reasonably well controlled.  We will continue gabapentin for his neuropathic pain, monitor his renal function as noted above.  Patient will need close follow-up with his PCP as an outpatient.  During hospital stay patient was noted to have severe right knee pain with severe osteoarthritis he was seen by orthopedics was unable to go through an MRI second ICD CTs were performed and orthopedic was consulted.  Patient underwent intra-articular articular steroid injection with great results.  Patient reported history of gout will continue his colchicine but will need close follow-up with his PCP given his renal function.  Patient will also continue his home medications for chronic problems to include CAD, and hyperlipidemia. Patient stable discharge home today.  He requested to be discharged home and felt he was at his baseline.  Discharge Diagnoses:  Active Problems:   CHF exacerbation (HCC)   Acute systolic CHF (congestive heart failure), NYHA class 3 ([place])    Discharge Instructions  Discharge Instructions    Call MD for:   Complete by: As directed    Any acute change in medical condition   Diet - low sodium heart healthy   Complete by: As directed    Increase activity slowly   Complete by: As directed      Allergies as of 09/14/2019      Reactions   Amitriptyline Other (See Comments)   drowsy   Hydrocodone Itching   Tramadol Nausea Only      Medication List    TAKE these medications   albuterol 108 (90 Base) MCG/ACT inhaler Commonly known as: VENTOLIN HFA Inhale 2 puffs into the lungs every  6 (six) hours as needed for wheezing or shortness of breath. What changed: Another medication with the same name was changed. Make sure you understand how and when to take each.   albuterol (2.5 MG/3ML) 0.083% nebulizer  solution Commonly known as: PROVENTIL INHALE CONTENTS OF 1 VIAL VIA NEBULIZER EVERY 6 HOURS AS NEEDED FOR WHEEZING/SHORTNESS OF BREATH What changed:   how much to take  how to take this  when to take this  reasons to take this  additional instructions   allopurinol 300 MG tablet Commonly known as: ZYLOPRIM Take 1 tablet (300 mg total) by mouth 2 (two) times daily.   aspirin 81 MG EC tablet Take 1 tablet (81 mg total) by mouth daily.   atorvastatin 80 MG tablet Commonly known as: LIPITOR Take 1 tablet (80 mg total) by mouth daily.   carvedilol 12.5 MG tablet Commonly known as: COREG Take 1 tablet (12.5 mg total) by mouth 2 (two) times daily with a meal. What changed:   medication strength  how much to take   Colchicine 0.6 MG Caps Take 0.6 mg by mouth 2 (two) times daily as needed (gout).   digoxin 0.125 MG tablet Commonly known as: LANOXIN Take 1 tablet (125 mcg total) by mouth daily.   DULoxetine 20 MG capsule Commonly known as: CYMBALTA TAKE 1 CAPSULE BY MOUTH EVERY DAY What changed: how much to take   Entresto 49-51 MG Generic drug: sacubitril-valsartan Take 1 tablet by mouth 2 (two) times daily.   freestyle lancets Use to check sugars daily   gabapentin 600 MG tablet Commonly known as: NEURONTIN Take 1 tablet (600 mg total) by mouth 2 (two) times daily.   glipiZIDE 5 MG tablet Commonly known as: GLUCOTROL Take 1 tablet (5 mg total) by mouth daily before breakfast.   glucose blood test strip Use as instructed   isosorbide-hydrALAZINE 20-37.5 MG tablet Commonly known as: BIDIL Take 1 tablet by mouth 3 (three) times daily.   nortriptyline 10 MG capsule Commonly known as: PAMELOR TAKE 1 CAPSULE (10 MG TOTAL) BY MOUTH AT BEDTIME.   torsemide 20 MG tablet Commonly known as: DEMADEX Take 2 tablets (40 mg total) by mouth daily.      Follow-up Information    Paralee Cancel, MD. Schedule an appointment as soon as possible for a visit in 2  week(s).   Specialty: Orthopedic Surgery Contact information: [address] [place] [address] [place] [address] [id number]          Allergies  Allergen Reactions  . Amitriptyline Other (See Comments)    drowsy  . Hydrocodone Itching  . Tramadol Nausea Only    Consultations:  cardiology   Procedures/Studies: Ct Knee Right Wo Contrast  Result Date: 09/11/2019 CLINICAL DATA:  Right knee pain EXAM: CT OF THE RIGHT KNEE WITHOUT CONTRAST TECHNIQUE: Multidetector CT imaging of the RIGHT knee was performed according to the standard protocol. Multiplanar CT image reconstructions were also generated. COMPARISON:  X-ray 09/10/2019 FINDINGS: Bones/Joint/Cartilage No acute fracture or dislocation. Moderate tricompartmental osteoarthritis manifested by joint space narrowing and marginal osteophyte formation. Suggestion of marginal erosions involving the knee, most notably at the medial tibial plateau (series 5, image 83) as well as probable involvement of the medial and lateral femoral condyles including the popliteal hiatus (series 5, image 66). Chondrocalcinosis of the menisci. Small to moderate sized joint effusion with thickened appearance of the joint capsule. There is a small amount of intra-articular air within the knee joint. Ligaments Suboptimally assessed by CT. Muscles  and Tendons Relatively preserved muscle bulk without significant atrophy or fatty infiltration. Tendinous structures grossly intact. Soft tissues Mild nonspecific edema within the superficial soft tissues of the visualized calf. Just posterior to the pes anserine tendon insertion is a well circumscribed nodule measuring 1.4 x 1.1 x 1.3 cm (series 6, image 118; series 9, image 57). IMPRESSION: 1. No acute fracture or malalignment of the right knee. 2. Small to moderate knee joint effusion with intra-articular air. Correlate for any recent joint injection/aspiration as these findings could be iatrogenic. Alternatively, in the  setting of trauma this could reflect traumatic arthrotomy. In the absence of recent trauma or instrumentation, a gas-forming infection must be considered. 3. Findings suggestive of marginal erosions involving the distal femur and tibial plateau, which raise the possibility of a crystalline arthropathy such as gout. 4. Nonspecific 1.4 cm nodule within the posteromedial soft tissues of the proximal calf near the pes anserine tendon insertion. Consider follow-up with a nonemergent MRI of the knee with and without intravenous contrast. These results will be called to the ordering clinician or representative by the Radiologist Assistant, and communication documented in the PACS or zVision Dashboard. Electronically Signed   By: Davina Poke M.D.   On: 09/11/2019 17:23   US Renal  Result Date: 09/12/2019 CLINICAL DATA:  63 year old male with acute renal insufficiency. History of hypertension, and diabetes. EXAM: RENAL / URINARY TRACT ULTRASOUND COMPLETE COMPARISON:  CT of the abdomen pelvis dated 02/19/2018. FINDINGS: Right Kidney: Renal measurements: 10.0 x 4.4 x 4.3 cm = volume: 99 mL. There is diffuse increased renal echogenicity. No hydronephrosis or shadowing stone. Left Kidney: Renal measurements: 9.6 x 4.7 x 4.9 cm = volume: 114 mL. There is diffuse increased renal echogenicity. No hydronephrosis or shadowing stone. Bladder: Appears normal for degree of bladder distention. There is a small ascites. Mild lobulation of the visualized liver contour may represent early cirrhosis. Clinical correlation is recommended. IMPRESSION: 1. Echogenic kidneys in keeping with chronic kidney disease. No hydronephrosis or shadowing stone. 2. Small ascites. Electronically Signed   By: Anner Crete M.D.   On: 09/12/2019 13:21   Ct Hip Right Wo Contrast  Result Date: 09/11/2019 CLINICAL DATA:  Right hip pain, AVN suspected. EXAM: CT OF THE RIGHT HIP WITHOUT CONTRAST TECHNIQUE: Multidetector CT imaging of the right hip  was performed according to the standard protocol. Multiplanar CT image reconstructions were also generated. COMPARISON:  X-rays 07/15/2019, CT 10/13/2015 FINDINGS: Bones/Joint/Cartilage No acute fracture. No malalignment. No evidence of femoral head AVN. Femoral head contour is maintained. Mild right hip osteoarthritis with prominent marginal osteophyte formation. Unchanged appearance of 1.7 cm lucent lesion with sclerotic margins within the intertrochanteric aspect of the proximal right femur, stable compared to 10/13/2015 and is benign. No new or suspicious osseous lesion. Trace right hip joint effusion. Ligaments Suboptimally assessed by CT. Muscles and Tendons Preserved muscle bulk without significant atrophy or fatty infiltration. Tendinous structures grossly intact, suboptimally evaluated. Soft tissues Mild subcutaneous edema predominantly involving the anterolateral aspect of the right hip and proximal femur. Moderate volume of ascites within the visualized portion of the pelvis. Vascular calcifications noted. Fat containing right inguinal hernia. IMPRESSION: 1. Negative for right hip fracture or dislocation. No CT evidence to suggest avascular necrosis. 2. Mild right hip osteoarthritis. 3. Trace right hip joint effusion, which is nonspecific. If there is high clinical concern for septic arthritis, consider arthrocentesis. 4. Nonspecific subcutaneous edema overlying the anterior and anterolateral aspects of the right hip and thigh. Findings could  be related to fluid overload versus cellulitis in the appropriate clinical setting. 5. Moderate volume ascites within the visualized pelvis. Electronically Signed   By: Davina Poke M.D.   On: 09/11/2019 17:31   Dg Chest Port 1 View  Result Date: 09/14/2019 CLINICAL DATA:  Shortness of breath. EXAM: PORTABLE CHEST 1 VIEW COMPARISON:  Radiographs of September 10, 2019. FINDINGS: Stable cardiomegaly. Status post coronary bypass graft. Left-sided pacemaker is  unchanged in position. No pneumothorax or pleural effusion is noted. No acute pulmonary disease is noted. Bony thorax is unremarkable. IMPRESSION: No active disease. Electronically Signed   By: Marijo Conception M.D.   On: 09/14/2019 11:05   Dg Chest Port 1 View  Result Date: 09/10/2019 CLINICAL DATA:  Shortness of breath. EXAM: PORTABLE CHEST 1 VIEW COMPARISON:  08/26/2019 FINDINGS: Multi lead left-sided pacemaker in place. Post median sternotomy. Chronic cardiomegaly, unchanged from prior exam. No acute airspace disease, pulmonary edema, pleural fluid or pneumothorax. IMPRESSION: Post CABG with chronic cardiomegaly.  No acute abnormality. Electronically Signed   By: Keith Rake M.D.   On: 09/10/2019 03:17   Dg Knee Complete 4 Views Right  Result Date: 09/10/2019 CLINICAL DATA:  Right knee buckled last night with lateral/anterior pain since. EXAM: RIGHT KNEE - COMPLETE 4+ VIEW COMPARISON:  06/12/2019 FINDINGS: There is mild tricompartmental osteoarthritis worse over the patellofemoral joint. Small joint effusion. No evidence of fracture or dislocation. IMPRESSION: No acute findings. Mild osteoarthritis with small joint effusion. Electronically Signed   By: Marin Olp M.D.   On: 09/10/2019 09:30   Vas [place] Lower Extremity Venous (dvt)  Result Date: 09/12/2019  Lower Venous Study Indications: Edema, and Pain.  Risk Factors: None identified. Limitations: Poor ultrasound/tissue interface and patient positioning, patient pain tolerance. Comparison Study: No prior studies. Performing Technologist: Oliver Hum RVT  Examination Guidelines: A complete evaluation includes B-mode imaging, spectral Doppler, color Doppler, and power Doppler as needed of all accessible portions of each vessel. Bilateral testing is considered an integral part of a complete examination. Limited examinations for reoccurring indications may be performed as noted.   +---------+---------------+---------+-----------+----------+--------------+ RIGHT    CompressibilityPhasicitySpontaneityPropertiesThrombus Aging +---------+---------------+---------+-----------+----------+--------------+ CFV      Full           Yes      Yes                                 +---------+---------------+---------+-----------+----------+--------------+ SFJ      Full                                                        +---------+---------------+---------+-----------+----------+--------------+ FV Prox  Full                                                        +---------+---------------+---------+-----------+----------+--------------+ FV Mid   Full                                                        +---------+---------------+---------+-----------+----------+--------------+  FV DistalFull                                                        +---------+---------------+---------+-----------+----------+--------------+ PFV      Full                                                        +---------+---------------+---------+-----------+----------+--------------+ POP      Full           Yes      Yes                                 +---------+---------------+---------+-----------+----------+--------------+ PTV      Full                                                        +---------+---------------+---------+-----------+----------+--------------+ PERO     Full                                                        +---------+---------------+---------+-----------+----------+--------------+   +---------+---------------+---------+-----------+----------+--------------+ LEFT     CompressibilityPhasicitySpontaneityPropertiesThrombus Aging +---------+---------------+---------+-----------+----------+--------------+ CFV      Full           Yes      Yes                                  +---------+---------------+---------+-----------+----------+--------------+ SFJ      Full                                                        +---------+---------------+---------+-----------+----------+--------------+ FV Prox  Full                                                        +---------+---------------+---------+-----------+----------+--------------+ FV Mid   Full                                                        +---------+---------------+---------+-----------+----------+--------------+ FV DistalFull                                                        +---------+---------------+---------+-----------+----------+--------------+  PFV      Full                                                        +---------+---------------+---------+-----------+----------+--------------+ POP      Full           Yes      Yes                                 +---------+---------------+---------+-----------+----------+--------------+ PTV      Full                                                        +---------+---------------+---------+-----------+----------+--------------+ PERO     Full                                                        +---------+---------------+---------+-----------+----------+--------------+     Summary: Right: There is no evidence of deep vein thrombosis in the lower extremity. However, portions of this examination were limited- see technologist comments above. No cystic structure found in the popliteal fossa. Left: There is no evidence of deep vein thrombosis in the lower extremity. However, portions of this examination were limited- see technologist comments above. No cystic structure found in the popliteal fossa.  *See table(s) above for measurements and observations. Electronically signed by Ruta Hinds MD on 09/12/2019 at 8:03:51 AM.    Final        Subjective:   Discharge Exam: Vitals:   09/14/19 0628 09/14/19 0827   BP: 132/73 119/84  Pulse: 67 72  Resp: 16   Temp: 97.8 F (36.6 C)   SpO2: 92%    Vitals:   09/13/19 1641 09/13/19 2155 09/14/19 0628 09/14/19 0827  BP: 102/62 110/60 132/73 119/84  Pulse: 70 70 67 72  Resp:  16 16   Temp:  98.6 F (37 C) 97.8 F (36.6 C)   TempSrc:  Oral Oral   SpO2:  94% 92%   Weight:   81.1 kg   Height:        General: Pt is alert, awake, not in acute distress Cardiovascular: RRR, S1/S2 +, no rubs, no gallops Respiratory: CTA bilaterally, no wheezing, no rhonchi Abdominal: Soft, NT, ND, bowel sounds + Extremities: no edema, no cyanosis    The results of significant diagnostics from this hospitalization (including imaging, microbiology, ancillary and laboratory) are listed below for reference.     Microbiology: Recent Results (from the past 240 hour(s))  SARS Coronavirus 2 [facility] order, Performed in [facility] hospital lab) Nasopharyngeal Nasopharyngeal Swab     Status: None   Collection Time: 09/10/19  4:53 AM   Specimen: Nasopharyngeal Swab  Result Value Ref Range Status   SARS Coronavirus 2 NEGATIVE NEGATIVE Final    Comment: (NOTE) If result is NEGATIVE SARS-CoV-2 target nucleic acids are NOT DETECTED. The SARS-CoV-2 RNA is generally detectable in upper and lower  respiratory specimens during the  acute phase of infection. The lowest  concentration of SARS-CoV-2 viral copies this assay can detect is 250  copies / mL. A negative result does not preclude SARS-CoV-2 infection  and should not be used as the sole basis for treatment or other  patient management decisions.  A negative result may occur with  improper specimen collection / handling, submission of specimen other  than nasopharyngeal swab, presence of viral mutation(s) within the  areas targeted by this assay, and inadequate number of viral copies  (<250 copies / mL). A negative result must be combined with clinical  observations, patient history, and epidemiological  information. If result is POSITIVE SARS-CoV-2 target nucleic acids are DETECTED. The SARS-CoV-2 RNA is generally detectable in upper and lower  respiratory specimens dur ing the acute phase of infection.  Positive  results are indicative of active infection with SARS-CoV-2.  Clinical  correlation with patient history and other diagnostic information is  necessary to determine patient infection status.  Positive results do  not rule out bacterial infection or co-infection with other viruses. If result is PRESUMPTIVE POSTIVE SARS-CoV-2 nucleic acids MAY BE PRESENT.   A presumptive positive result was obtained on the submitted specimen  and confirmed on repeat testing.  While 2019 novel coronavirus  (SARS-CoV-2) nucleic acids may be present in the submitted sample  additional confirmatory testing may be necessary for epidemiological  and / or clinical management purposes  to differentiate between  SARS-CoV-2 and other Sarbecovirus currently known to infect humans.  If clinically indicated additional testing with an alternate test  methodology [phone]) is advised. The SARS-CoV-2 RNA is generally  detectable in upper and lower respiratory sp ecimens during the acute  phase of infection. The expected result is Negative. Fact Sheet for Patients:  StrictlyIdeas.no Fact Sheet for Healthcare Providers: BankingDealers.co.za This test is not yet approved or cleared by the [place] FDA and has been authorized for detection and/or diagnosis of SARS-CoV-2 by FDA under an Emergency Use Authorization (EUA).  This EUA will remain in effect (meaning this test can be used) for the duration of the COVID-19 declaration under Section 564(b)(1) of the Act, 21 U.S.C. section 360bbb-3(b)(1), unless the authorization is terminated or revoked sooner. Performed at [facility], [place] [address], [place], [place] [address]      Labs: BNP  (last 3 results) Recent Labs    06/04/19 2228 09/10/19 0237  BNP 1,580.2* [id number]*   Basic Metabolic Panel: Recent Labs  Lab 09/11/19 0509 09/12/19 0402 09/12/19 1619 09/13/19 0347 09/14/19 0909  NA 135 135 134* 130* 133*  K 3.2* 4.0 4.4 4.3 4.1  CL 93* 92* 92* 91* 93*  CO2 33* 29 30 27 30   GLUCOSE 123* 161* 188* 196* 170*  BUN 30* 45* 54* 61* 57*  CREATININE 1.67* 2.10* 2.41* 2.41* 1.98*  CALCIUM 8.5* 8.4* 8.1* 8.2* 8.4*  MG  --   --  1.7 1.8  --    Liver Function Tests: Recent Labs  Lab 09/10/19 0237 09/13/19 0347  AST 25 17  ALT 13 10  ALKPHOS 104 82  BILITOT 2.4* 1.5*  PROT 8.2* 6.3*  ALBUMIN 3.0* 2.3*   No results for input(s): LIPASE, AMYLASE in the last 168 hours. No results for input(s): AMMONIA in the last 168 hours. CBC: Recent Labs  Lab 09/10/19 0237 09/13/19 0347  WBC 4.8 9.8  NEUTROABS 2.9 8.8*  HGB 11.7* 10.7*  HCT 37.3* 33.3*  MCV 94.7 92.8  PLT 199 214  Cardiac Enzymes: No results for input(s): CKTOTAL, CKMB, CKMBINDEX, TROPONINI in the last 168 hours. BNP: Invalid input(s): POCBNP CBG: Recent Labs  Lab 09/13/19 1144 09/13/19 1647 09/13/19 2153 09/14/19 0806 09/14/19 1154  GLUCAP 215* 144* 221* 152* 161*   D-Dimer No results for input(s): DDIMER in the last 72 hours. Hgb A1c No results for input(s): HGBA1C in the last 72 hours. Lipid Profile No results for input(s): CHOL, HDL, LDLCALC, TRIG, CHOLHDL, LDLDIRECT in the last 72 hours. Thyroid function studies No results for input(s): TSH, T4TOTAL, T3FREE, THYROIDAB in the last 72 hours.  Invalid input(s): FREET3 Anemia work up No results for input(s): VITAMINB12, FOLATE, FERRITIN, TIBC, IRON, RETICCTPCT in the last 72 hours. Urinalysis    Component Value Date/Time   COLORURINE YELLOW 02/19/2018 1301   APPEARANCEUR CLEAR 02/19/2018 1301   LABSPEC 1.025 02/19/2018 1301   PHURINE 6.0 02/19/2018 1301   GLUCOSEU >=500 (A) 02/19/2018 1301   HGBUR TRACE (A) 02/19/2018 1301    BILIRUBINUR NEGATIVE 02/19/2018 1301   KETONESUR NEGATIVE 02/19/2018 1301   PROTEINUR 100 (A) 02/19/2018 1301   UROBILINOGEN 1.0 10/12/2015 1830   NITRITE NEGATIVE 02/19/2018 1301   LEUKOCYTESUR NEGATIVE 02/19/2018 1301   Sepsis Labs Invalid input(s): PROCALCITONIN,  WBC,  LACTICIDVEN Microbiology Recent Results (from the past 240 hour(s))  SARS Coronavirus 2 [facility] order, Performed in [facility] hospital lab) Nasopharyngeal Nasopharyngeal Swab     Status: None   Collection Time: 09/10/19  4:53 AM   Specimen: Nasopharyngeal Swab  Result Value Ref Range Status   SARS Coronavirus 2 NEGATIVE NEGATIVE Final    Comment: (NOTE) If result is NEGATIVE SARS-CoV-2 target nucleic acids are NOT DETECTED. The SARS-CoV-2 RNA is generally detectable in upper and lower  respiratory specimens during the acute phase of infection. The lowest  concentration of SARS-CoV-2 viral copies this assay can detect is 250  copies / mL. A negative result does not preclude SARS-CoV-2 infection  and should not be used as the sole basis for treatment or other  patient management decisions.  A negative result may occur with  improper specimen collection / handling, submission of specimen other  than nasopharyngeal swab, presence of viral mutation(s) within the  areas targeted by this assay, and inadequate number of viral copies  (<250 copies / mL). A negative result must be combined with clinical  observations, patient history, and epidemiological information. If result is POSITIVE SARS-CoV-2 target nucleic acids are DETECTED. The SARS-CoV-2 RNA is generally detectable in upper and lower  respiratory specimens dur ing the acute phase of infection.  Positive  results are indicative of active infection with SARS-CoV-2.  Clinical  correlation with patient history and other diagnostic information is  necessary to determine patient infection status.  Positive results do  not rule out bacterial infection or  co-infection with other viruses. If result is PRESUMPTIVE POSTIVE SARS-CoV-2 nucleic acids MAY BE PRESENT.   A presumptive positive result was obtained on the submitted specimen  and confirmed on repeat testing.  While 2019 novel coronavirus  (SARS-CoV-2) nucleic acids may be present in the submitted sample  additional confirmatory testing may be necessary for epidemiological  and / or clinical management purposes  to differentiate between  SARS-CoV-2 and other Sarbecovirus currently known to infect humans.  If clinically indicated additional testing with an alternate test  methodology [phone]) is advised. The SARS-CoV-2 RNA is generally  detectable in upper and lower respiratory sp ecimens during the acute  phase of infection. The expected result is  Negative. Fact Sheet for Patients:  StrictlyIdeas.no Fact Sheet for Healthcare Providers: BankingDealers.co.za This test is not yet approved or cleared by the [place] FDA and has been authorized for detection and/or diagnosis of SARS-CoV-2 by FDA under an Emergency Use Authorization (EUA).  This EUA will remain in effect (meaning this test can be used) for the duration of the COVID-19 declaration under Section 564(b)(1) of the Act, 21 U.S.C. section 360bbb-3(b)(1), unless the authorization is terminated or revoked sooner. Performed at [facility], [place] [address], [place], [place] [address]      Time coordinating discharge: Over 30 minutes  SIGNED:   Nicolette Bang, MD  [facility] 09/14/2019, 12:22 PM Pager   If 7PM-7AM, please contact night-coverage [email or web address] Password [email or web address]

## 2019-09-14 NOTE — Telephone Encounter (Signed)
Left VM for pt to call back to schedule hospital f/u.

## 2019-09-14 NOTE — Progress Notes (Signed)
Progress Note  Patient Name: Patrick Stewart Date of Encounter: 09/14/2019  Primary Cardiologist: Loralie Champagne, MD   Subjective   Overall, feels significantly better. Currently w/o supplemental O2 requirements. Sitting propped up in bed but reports he was able to recline head of bed, nearly flat last night, w/o orthopnea, PND or cough. Ambulating around room w/o dyspnea. Has not ambulated the halls. No CP.   Inpatient Medications    Scheduled Meds: . aspirin EC  81 mg Oral Daily  . atorvastatin  80 mg Oral Daily  . carvedilol  12.5 mg Oral BID WC  . digoxin  125 mcg Oral Daily  . DULoxetine  20 mg Oral Daily  . enoxaparin (LOVENOX) injection  40 mg Subcutaneous Daily  . gabapentin  600 mg Oral BID  . influenza vac split quadrivalent PF  0.5 mL Intramuscular Tomorrow-1000  . insulin aspart  0-15 Units Subcutaneous TID WC  . insulin aspart  0-5 Units Subcutaneous QHS  . isosorbide-hydrALAZINE  1 tablet Oral TID  . lidocaine  2 patch Transdermal Q24H  . lidocaine (PF)  5 mL Other Once  . methylPREDNISolone acetate  80 mg Intra-articular Once  . nortriptyline  10 mg Oral QHS  . pneumococcal 23 valent vaccine  0.5 mL Intramuscular Tomorrow-1000  . sodium chloride flush  3 mL Intravenous Q12H   Continuous Infusions: . sodium chloride     PRN Meds: sodium chloride, acetaminophen, albuterol, ondansetron (ZOFRAN) IV, oxyCODONE-acetaminophen, sodium chloride flush   Vital Signs    Vitals:   09/13/19 1641 09/13/19 2155 09/14/19 0628 09/14/19 0827  BP: 102/62 110/60 132/73 119/84  Pulse: 70 70 67 72  Resp:  16 16   Temp:  98.6 F (37 C) 97.8 F (36.6 C)   TempSrc:  Oral Oral   SpO2:  94% 92%   Weight:   81.1 kg   Height:        Intake/Output Summary (Last 24 hours) at 09/14/2019 0909 Last data filed at 09/14/2019 [id number] Gross per 24 hour  Intake 240 ml  Output 2675 ml  Net -2435 ml   Last 3 Weights 09/14/2019 09/13/2019 09/12/2019  Weight (lbs) 178 lb 11.2 oz 181 lb 3.5  oz 180 lb 5.4 oz  Weight (kg) 81.058 kg 82.2 kg 81.8 kg      Telemetry    V paced 60s occasional PVCs - Personally Reviewed  ECG    No new EKG to review today- Personally Reviewed  Physical Exam   GEN: No acute distress.   Neck: mild JVD Cardiac: RRR, no murmurs, rubs, or gallops.  Respiratory: Clear to auscultation bilaterally. GI: Soft, nontender, non-distended  MS: 1+ bilateral ankle edema; No deformity. Neuro:  Nonfocal  Psych: Normal affect   Labs    High Sensitivity Troponin:   Recent Labs  Lab 09/10/19 0237 09/10/19 0445  TROPONINIHS 52* 53*      Chemistry Recent Labs  Lab 09/10/19 0237  09/12/19 0402 09/12/19 1619 09/13/19 0347  NA 135   < > 135 134* 130*  K 3.6   < > 4.0 4.4 4.3  CL 96*   < > 92* 92* 91*  CO2 27   < > 29 30 27   GLUCOSE 143*   < > 161* 188* 196*  BUN 30*   < > 45* 54* 61*  CREATININE 1.56*   < > 2.10* 2.41* 2.41*  CALCIUM 8.8*   < > 8.4* 8.1* 8.2*  PROT 8.2*  --   --   --  6.3*  ALBUMIN 3.0*  --   --   --  2.3*  AST 25  --   --   --  17  ALT 13  --   --   --  10  ALKPHOS 104  --   --   --  82  BILITOT 2.4*  --   --   --  1.5*  GFRNONAA 47*   < > 33* 28* 28*  GFRAA 54*   < > 38* 32* 32*  ANIONGAP 12   < > 14 12 12    < > = values in this interval not displayed.     Hematology Recent Labs  Lab 09/10/19 0237 09/13/19 0347  WBC 4.8 9.8  RBC 3.94* 3.59*  HGB 11.7* 10.7*  HCT 37.3* 33.3*  MCV 94.7 92.8  MCH 29.7 29.8  MCHC 31.4 32.1  RDW 17.8* 17.7*  PLT 199 214    BNP Recent Labs  Lab 09/10/19 0237  BNP 2,672.7*     DDimer No results for input(s): DDIMER in the last 168 hours.   Radiology    US Renal  Result Date: 09/12/2019 CLINICAL DATA:  63 year old male with acute renal insufficiency. History of hypertension, and diabetes. EXAM: RENAL / URINARY TRACT ULTRASOUND COMPLETE COMPARISON:  CT of the abdomen pelvis dated 02/19/2018. FINDINGS: Right Kidney: Renal measurements: 10.0 x 4.4 x 4.3 cm = volume: 99 mL.  There is diffuse increased renal echogenicity. No hydronephrosis or shadowing stone. Left Kidney: Renal measurements: 9.6 x 4.7 x 4.9 cm = volume: 114 mL. There is diffuse increased renal echogenicity. No hydronephrosis or shadowing stone. Bladder: Appears normal for degree of bladder distention. There is a small ascites. Mild lobulation of the visualized liver contour may represent early cirrhosis. Clinical correlation is recommended. IMPRESSION: 1. Echogenic kidneys in keeping with chronic kidney disease. No hydronephrosis or shadowing stone. 2. Small ascites. Electronically Signed   By: Anner Crete M.D.   On: 09/12/2019 13:21    Cardiac Studies   Echo 09/12/19: 1. The left ventricle has severely reduced systolic function, with an ejection fraction of 20-25%. The cavity size was mildly dilated. There is moderately increased left ventricular wall thickness. Left ventricular diastolic Doppler parameters are  consistent with impaired relaxation. Elevated left atrial and left ventricular end-diastolic pressures The E/e' is >15. There is abnormal septal motion consistent with RV pacemaker. Left ventricular diffuse hypokinesis. 2. The right ventricle has moderately reduced systolic function. The cavity was mildly enlarged. There is no increase in right ventricular wall thickness. 3. Left atrial size was moderately dilated. 4. The mitral valve is grossly normal. 5. The tricuspid valve is grossly normal. 6. The aortic valve is tricuspid. Mild thickening of the aortic valve. Aortic valve regurgitation is mild by color flow Doppler. No stenosis of the aortic valve. 7. The aorta is normal unless otherwise noted. 8. The inferior vena cava was dilated in size with >50% respiratory variability.   Echocardiogram 06/05/2019 IMPRESSIONS 1. The left ventricle has severely reduced systolic function, with an ejection fraction of 20-25%. The cavity size was mildly dilated. There is mildly increased left  ventricular wall thickness. Left ventricular diastolic Doppler parameters are  consistent with pseudonormalization. Inferior and inferoseptal akinesis, the other wall segments are hypokinetic. 2. The right ventricle has moderately reduced systolic function. The cavity was normal. There is no increase in right ventricular wall thickness. 3. Left atrial size was moderately dilated. 4. Right atrial size was mildly dilated. 5. Trivial pericardial effusion is  present. 6. There is a pleural effusion present. 7. The aortic valve is tricuspid. Mild calcification of the aortic valve. Aortic valve regurgitation is moderate by color flow Doppler. No stenosis of the aortic valve. 8. The aortic root is normal in size and structure. 9. The inferior vena cava was dilated in size with <50% respiratory variability. PA systolic pressure 60 mmHg. 10. No evidence of mitral valve stenosis. Trivial mitral regurgitation  Patient Profile     63 y.o. male with a hx of chronic systolic and diastolic heart failure, ICD in place ([employer]), CAD s/p CABG (2010), DM, HTN, HLD, moderate pulmonary HTN, gout, and CKD stage IIIwho isadmitted forthe evaluation of CHF exacerbation  Assessment & Plan    1. Acute on Chronic Combined Systolic and Diastolic CHF, NYHA Class III: chronic systolic HF 2/2 ischemic CM. EF 20-25%. Admit BNP 2,672, significantly higher than previous baseline.   Good response to IV Lasix, additional 2.6 L out in past 24 hrs. Net I/Os negative 6.7 L total since admit  Wt improved from 186 lb on admit to 178 lb today  Pt notes significant subjective improvement. No resting dyspnea, orthopnea or PND  Pt encouraged to ambulate w/ PT today to assess for residual exertional dyspnea  Still w/ trace to 1+ bilateral ankle edema on exam  Scr improved in past 24 hrs, down from 2.41>>1.98 after PM dose of IV Lasix was held yesterday  Plan to transition back to PO torsemide today  F/u BMP in the  AM  Continue Strict I/Os and daily weights  Low sodium diet   2. Ischemic Cardiomyopathy: EF 20-25%. Followed in [facility] by Dr. Aundra Dubin.  ARNI, Entresto, on hold due to rising SCr  On  blocker therapy w/ Coreg 12.5 mg bid  No aldactone due to CKD  On digoxin 0.125 mg daily  Given concomitant DM, consider later addition of a SGLT2i (will defer to [facility])  3. CAD: s/p CABG x 2 in 2010 at [facility]. LIMA-LAD and SVG-OM2. Patent grafts on LHC in 2018.  No recent anginal symptoms. Continue medical therapy for secondary prevention  ASA 81 mg daily   blocker therapy w/ Coreg 12.5 mg BID  Statin therapy w/ atorvastatin 80 mg nightly  4. BiV CRT-D: implanted for ICM w/ EF < 35% + LBBB. Followed by Dr. Curt Bears  Most recent device interrogation 7/20 showed BiV pacing 96%  5. Acute on Chronic CKD, Stage III: significant increase in SCr this admit, from 1.56>>2.41  SCr improved in past 24 hrs, down from 2.41>>1.98 after PM dose of IV Lasix was held yesterday  Plan to resume PO diuretics  F/u BMP in the AM  6. HLD w/ LDL goal < 70: not at goal. Recent LP showed elevated LDL at 100 mg/dL.   Continue high intensity statin therapy w/ atorvastatin 80 mg nightly  Consider adding Zetia for further LDL reduction  7. T2DM: controlled. Recent Hgb A1c was 6.8   For questions or updates, please contact [place] Please consult [email or web address] for contact info under        Signed, Lyda Jester, PA-C  09/14/2019, 9:09 AM

## 2019-09-17 ENCOUNTER — Other Ambulatory Visit: Payer: Self-pay

## 2019-09-17 ENCOUNTER — Emergency Department (HOSPITAL_BASED_OUTPATIENT_CLINIC_OR_DEPARTMENT_OTHER)
Admission: EM | Admit: 2019-09-17 | Discharge: 2019-09-17 | Disposition: A | Discharge status: Home / Self Care | Payer: Medicare Other | Attending: Emergency Medicine | Admitting: Emergency Medicine

## 2019-09-17 ENCOUNTER — Emergency Department (HOSPITAL_BASED_OUTPATIENT_CLINIC_OR_DEPARTMENT_OTHER): Payer: Medicare Other

## 2019-09-17 ENCOUNTER — Encounter (HOSPITAL_BASED_OUTPATIENT_CLINIC_OR_DEPARTMENT_OTHER): Payer: Self-pay | Admitting: *Deleted

## 2019-09-17 DIAGNOSIS — N183 Chronic kidney disease, stage 3 (moderate): Secondary | ICD-10-CM | POA: Diagnosis not present

## 2019-09-17 DIAGNOSIS — M79632 Pain in left forearm: Secondary | ICD-10-CM | POA: Diagnosis not present

## 2019-09-17 DIAGNOSIS — S4992XA Unspecified injury of left shoulder and upper arm, initial encounter: Secondary | ICD-10-CM | POA: Diagnosis not present

## 2019-09-17 DIAGNOSIS — Z7982 Long term (current) use of aspirin: Secondary | ICD-10-CM | POA: Diagnosis not present

## 2019-09-17 DIAGNOSIS — I5022 Chronic systolic (congestive) heart failure: Secondary | ICD-10-CM | POA: Insufficient documentation

## 2019-09-17 DIAGNOSIS — Z87891 Personal history of nicotine dependence: Secondary | ICD-10-CM | POA: Diagnosis not present

## 2019-09-17 DIAGNOSIS — M79602 Pain in left arm: Secondary | ICD-10-CM

## 2019-09-17 DIAGNOSIS — Z79899 Other long term (current) drug therapy: Secondary | ICD-10-CM | POA: Diagnosis not present

## 2019-09-17 DIAGNOSIS — I130 Hypertensive heart and chronic kidney disease with heart failure and stage 1 through stage 4 chronic kidney disease, or unspecified chronic kidney disease: Secondary | ICD-10-CM | POA: Diagnosis not present

## 2019-09-17 DIAGNOSIS — I25110 Atherosclerotic heart disease of native coronary artery with unstable angina pectoris: Secondary | ICD-10-CM | POA: Insufficient documentation

## 2019-09-17 DIAGNOSIS — S59912A Unspecified injury of left forearm, initial encounter: Secondary | ICD-10-CM | POA: Diagnosis not present

## 2019-09-17 DIAGNOSIS — W108XXA Fall (on) (from) other stairs and steps, initial encounter: Secondary | ICD-10-CM | POA: Diagnosis not present

## 2019-09-17 DIAGNOSIS — Y999 Unspecified external cause status: Secondary | ICD-10-CM | POA: Insufficient documentation

## 2019-09-17 DIAGNOSIS — E1122 Type 2 diabetes mellitus with diabetic chronic kidney disease: Secondary | ICD-10-CM | POA: Diagnosis not present

## 2019-09-17 DIAGNOSIS — Y92008 Other place in unspecified non-institutional (private) residence as the place of occurrence of the external cause: Secondary | ICD-10-CM | POA: Diagnosis not present

## 2019-09-17 DIAGNOSIS — Z7984 Long term (current) use of oral hypoglycemic drugs: Secondary | ICD-10-CM | POA: Insufficient documentation

## 2019-09-17 DIAGNOSIS — I252 Old myocardial infarction: Secondary | ICD-10-CM | POA: Insufficient documentation

## 2019-09-17 DIAGNOSIS — J449 Chronic obstructive pulmonary disease, unspecified: Secondary | ICD-10-CM | POA: Insufficient documentation

## 2019-09-17 DIAGNOSIS — Y9389 Activity, other specified: Secondary | ICD-10-CM | POA: Diagnosis not present

## 2019-09-17 DIAGNOSIS — Z951 Presence of aortocoronary bypass graft: Secondary | ICD-10-CM | POA: Insufficient documentation

## 2019-09-17 DIAGNOSIS — M25561 Pain in right knee: Secondary | ICD-10-CM | POA: Diagnosis not present

## 2019-09-17 DIAGNOSIS — M79622 Pain in left upper arm: Secondary | ICD-10-CM | POA: Insufficient documentation

## 2019-09-17 LAB — BASIC METABOLIC PANEL
Anion gap: 13 (ref 5–15)
BUN: 38 mg/dL — ABNORMAL HIGH (ref 8–23)
CO2: 25 mmol/L (ref 22–32)
Calcium: 8.9 mg/dL (ref 8.9–10.3)
Chloride: 98 mmol/L (ref 98–111)
Creatinine, Ser: 1.34 mg/dL — ABNORMAL HIGH (ref 0.61–1.24)
GFR calc Af Amer: >60 mL/min (ref >60)
GFR calc non Af Amer: 56 mL/min — ABNORMAL LOW (ref >60)
Glucose, Bld: 154 mg/dL — ABNORMAL HIGH (ref 70–99)
Potassium: 4.4 mmol/L (ref 3.5–5.1)
Sodium: 136 mmol/L (ref 135–145)

## 2019-09-17 LAB — CBC WITH DIFFERENTIAL/PLATELET
Abs Immature Granulocytes: 0.04 10*3/uL (ref 0.00–0.07)
Basophils Absolute: 0 10*3/uL (ref 0.0–0.1)
Basophils Relative: 0 %
Eosinophils Absolute: 0.1 10*3/uL (ref 0.0–0.5)
Eosinophils Relative: 2 %
HCT: 38.7 % — ABNORMAL LOW (ref 39.0–52.0)
Hemoglobin: 12.2 g/dL — ABNORMAL LOW (ref 13.0–17.0)
Immature Granulocytes: 1 %
Lymphocytes Relative: 7 %
Lymphs Abs: 0.5 10*3/uL — ABNORMAL LOW (ref 0.7–4.0)
MCH: 29.3 pg (ref 26.0–34.0)
MCHC: 31.5 g/dL (ref 30.0–36.0)
MCV: 92.8 fL (ref 80.0–100.0)
Monocytes Absolute: 1.4 10*3/uL — ABNORMAL HIGH (ref 0.1–1.0)
Monocytes Relative: 18 %
Neutro Abs: 5.3 10*3/uL (ref 1.7–7.7)
Neutrophils Relative %: 72 %
Platelets: 198 10*3/uL (ref 150–400)
RBC: 4.17 MIL/uL — ABNORMAL LOW (ref 4.22–5.81)
RDW: 17.4 % — ABNORMAL HIGH (ref 11.5–15.5)
WBC: 7.4 10*3/uL (ref 4.0–10.5)
nRBC: 0 % (ref 0.0–0.2)

## 2019-09-17 LAB — CK: Total CK: 155 U/L (ref 49–397)

## 2019-09-17 MED ORDER — OXYCODONE HCL 5 MG PO TABS
5.0000 mg | ORAL_TABLET | Freq: Once | ORAL | Status: AC
  Administered 2019-09-17: 5 mg via ORAL
  Filled 2019-09-17: qty 1

## 2019-09-17 MED ORDER — OXYCODONE HCL 5 MG PO TABS
5.0000 mg | ORAL_TABLET | Freq: Once | ORAL | Status: AC
  Administered 2019-09-17: 21:00:00 5 mg via ORAL
  Filled 2019-09-17: qty 1

## 2019-09-17 MED ORDER — OXYCODONE HCL 5 MG PO TABS: 5.0000 mg | ORAL_TABLET | ORAL | 0 refills | Status: DC | PRN

## 2019-09-17 NOTE — Discharge Instructions (Signed)
Please read and follow all provided instructions.  Your diagnoses today include:  1. Pain of left upper extremity   2. Acute pain of right knee     Tests performed today include:  X-ray of your left arm and right knee - no broken bones  Blood cell counts and electrolytes - improved from previous hospitalization  Vital signs. See below for your results today.   Medications prescribed:   Oxycodone - narcotic pain medication  DO NOT drive or perform any activities that require you to be awake and alert because this medicine can make you drowsy.  Take any prescribed medications only as directed.  Home care instructions:  Follow any educational materials contained in this packet.  BE VERY CAREFUL not to take multiple medicines containing Tylenol (also called acetaminophen). Doing so can lead to an overdose which can damage your liver and cause liver failure and possibly death.   Follow-up instructions: Please follow-up with your primary care provider in the next 3 days for further evaluation of your symptoms.   Return instructions:   Please return to the Emergency Department if you experience worsening symptoms.   Please return if you have any other emergent concerns.  Additional Information:  Your vital signs today were: BP (!) 177/102    Pulse (!) 109    Temp 99 F (37.2 C)    Resp (!) 23    Ht 5\' 10"  (1.778 m)    Wt 80.3 kg    SpO2 97%    BMI 25.40 kg/m  If your blood pressure (BP) was elevated above 135/85 this visit, please have this repeated by your doctor within one month. --------------

## 2019-09-17 NOTE — ED Notes (Signed)
This EMT and the RN taking care of this patient went in and assisted the patient to walk with a walker. Patient ambulated to the door and then back to the bed with minimal assistance.

## 2019-09-17 NOTE — ED Provider Notes (Signed)
[facility] EMERGENCY DEPARTMENT Provider Note   CSN: [id number] Arrival date & time: 09/17/19  1742     History   Chief Complaint Chief Complaint  Patient presents with  . Fall    HPI Patrick Stewart is a 63 y.o. male.     Patient with recent hospitalization for congestive heart failure exacerbation presents to the emergency department today with complaints of right knee pain and left upper extremity pain.  Patient states that he fell at his house 2 days ago while going up some stairs.  He states that he laid on the ground for approximately 5 hours.  He was able to crawl but was unable to get himself up off the ground until his wife returned home from work.  Patient was seen by family yesterday and he was complaining about some knee pain.  Per family, yesterday he was ambulatory but with assistance.  Pain was severe today prompting emergency department visit.  Patient complains of pain in his left arm, worse in the upper arm.  He also complains of right knee pain.  No chest pain or shortness of breath.  Patient denies hitting his head or losing consciousness at the time of the fall.  He has not had any headaches or vomiting.  No treatments prior to arrival.     Past Medical History:  Diagnosis Date  . CAD (coronary artery disease) 2010   3v CABG  . CHF (congestive heart failure) ([place]) 01/26/2012  . CKD (chronic kidney disease) 02/19/2012  . COPD with chronic bronchitis ([place]) 08/26/2016  . Diabetes mellitus type 2, uncontrolled ([place]) 01/26/2012  . Elevated liver function tests 01/26/2012  . Gout 01/26/2012  . Hx of CABG 2010   RCA Stent 2008, CABG x 2 [facility] 2010  . Hypercholesterolemia 07/08/2016  . Hypertension 09/04/2014  . Ischemic cardiomyopathy 11/15/2014  . Kidney stone 10/25/2015  . Lipid disorder 01/26/2012  . Noncompliance 10/18/2018  . NSTEMI (non-ST elevated myocardial infarction) ([place]) 07/08/2016  . ST elevation myocardial infarction (STEMI) ([place]) 2010   . Unstable angina ([facility]) 11/14/2014    Patient Active Problem List   Diagnosis Date Noted  . CHF exacerbation ([place]) 09/10/2019  . Acute systolic CHF (congestive heart failure), NYHA class 3 ([place]) 09/10/2019  . Acute exacerbation of CHF (congestive heart failure) ([place]) 06/05/2019  . Uncontrolled type 2 diabetes mellitus ([place]) 06/05/2019  . Diabetic polyneuropathy associated with type 2 diabetes mellitus ([place]) 02/15/2019  . Noncompliance 10/18/2018  . Noncompliance by refusing intervention or support 04/06/2018  . Uncontrolled type 2 diabetes mellitus with hyperglycemia ([place]) 01/31/2018  . Hypertensive urgency 07/12/2017  . LBBB (left bundle branch block) 04/26/2017  . COPD with chronic bronchitis ([place]) 08/26/2016  . Kidney stone on right side 10/25/2015  . CKD (chronic kidney disease), stage III ([place]) 10/13/2015  . Gout 10/13/2015  . CAD Status post CABG 2 SVG to OM 1, LIMA to mid LAD: 10/13/2015  . Cardiomyopathy, ischemic, chronic systolic CHF [id number]  . Essential hypertension 09/04/2014  . Benign prostatic hyperplasia with lower urinary tract symptoms 01/24/2013  . Peyronie's disease 12/18/2012  . Chronic joint pain 06/22/2012  . History of tobacco abuse 01/26/2012  . Inguinal hernia, right 01/26/2012    Past Surgical History:  Procedure Laterality Date  . BIV ICD INSERTION CRT-D N/A 10/21/2017   Procedure: BIV ICD INSERTION CRT-D;  Surgeon: Constance Haw, MD;  Location: [facility];  Service: Cardiovascular;  Laterality: N/A;  . CORONARY ARTERY BYPASS GRAFT  2010  . KNEE SURGERY    . RIGHT/LEFT HEART CATH AND CORONARY ANGIOGRAPHY N/A 04/15/2017   Procedure: Right/Left Heart Cath and Coronary Angiography;  Surgeon: Larey Dresser, MD;  Location: [facility];  Service: Cardiovascular;  Laterality: N/A;  . ROTATOR CUFF REPAIR    . WRIST SURGERY          Home Medications    Prior to Admission medications   Medication Sig Start Date End Date  Taking? Authorizing Provider  albuterol (PROVENTIL HFA;VENTOLIN HFA) 108 (90 Base) MCG/ACT inhaler Inhale 2 puffs into the lungs every 6 (six) hours as needed for wheezing or shortness of breath.    [provider]  albuterol (PROVENTIL) (2.5 MG/3ML) 0.083% nebulizer solution INHALE CONTENTS OF 1 VIAL VIA NEBULIZER EVERY 6 HOURS AS NEEDED FOR WHEEZING/SHORTNESS OF BREATH Patient taking differently: Take 2.5 mg by nebulization every 6 (six) hours as needed for wheezing or shortness of breath.  11/11/18   Shelda Pal, DO  allopurinol (ZYLOPRIM) 300 MG tablet Take 1 tablet (300 mg total) by mouth 2 (two) times daily. 02/15/19   Shelda Pal, DO  aspirin EC 81 MG EC tablet Take 1 tablet (81 mg total) by mouth daily. 04/16/17   Clegg, Amy D, NP  atorvastatin (LIPITOR) 80 MG tablet Take 1 tablet (80 mg total) by mouth daily. 08/26/16   Shelda Pal, DO  carvedilol (COREG) 12.5 MG tablet Take 1 tablet (12.5 mg total) by mouth 2 (two) times daily with a meal. 09/14/19 10/14/19  Spongberg, Audie Pinto, MD  Colchicine 0.6 MG CAPS Take 0.6 mg by mouth 2 (two) times daily as needed (gout).  08/30/19   [provider]  digoxin (LANOXIN) 0.125 MG tablet Take 1 tablet (125 mcg total) by mouth daily. 06/10/19   [place] Donnamarie Poag, DO  DULoxetine (CYMBALTA) 20 MG capsule TAKE 1 CAPSULE BY MOUTH EVERY DAY Patient taking differently: Take 20 mg by mouth daily.  06/07/19   Shelda Pal, DO  gabapentin (NEURONTIN) 600 MG tablet Take 1 tablet (600 mg total) by mouth 2 (two) times daily. 05/12/19   Shelda Pal, DO  glipiZIDE (GLUCOTROL) 5 MG tablet Take 1 tablet (5 mg total) by mouth daily before breakfast. 05/12/19   Wendling, Crosby Oyster, DO  glucose blood test strip Use as instructed 08/26/16   Shelda Pal, DO  isosorbide-hydrALAZINE (BIDIL) 20-37.5 MG tablet Take 1 tablet by mouth 3 (three) times daily. 09/14/19 10/14/19  Marcell Anger, MD  Lancets (FREESTYLE) lancets Use to check sugars daily 08/26/16   Shelda Pal, DO  nortriptyline (PAMELOR) 10 MG capsule TAKE 1 CAPSULE (10 MG TOTAL) BY MOUTH AT BEDTIME. 07/25/19   Patel, Donika K, DO  sacubitril-valsartan (ENTRESTO) 49-51 MG Take 1 tablet by mouth 2 (two) times daily. 06/10/19   [place] Donnamarie Poag, DO  torsemide (DEMADEX) 20 MG tablet Take 2 tablets (40 mg total) by mouth daily. 06/10/19   [place] Eric J, DO    Family History Family History  Problem Relation Age of Onset  . Hypertension Mother   . Diabetes Mother   . Hyperlipidemia Mother   . Kidney failure Mother   . Emphysema Father     Social History Social History   Tobacco Use  . Smoking status: Former Smoker    Packs/day: 0.50    Years: 30.00    Pack years: 15.00    Quit date: 07/10/2016    Years since quitting: 3.1  .  Smokeless tobacco: Never Used  Substance Use Topics  . Alcohol use: No  . Drug use: No     Allergies   Amitriptyline, Hydrocodone, and Tramadol   Review of Systems Review of Systems  Constitutional: Negative for fever.  HENT: Negative for rhinorrhea and sore throat.   Eyes: Negative for redness.  Respiratory: Negative for cough.   Cardiovascular: Negative for chest pain.  Gastrointestinal: Negative for abdominal pain, diarrhea, nausea and vomiting.  Genitourinary: Negative for dysuria.  Musculoskeletal: Positive for gait problem, joint swelling and myalgias. Negative for back pain and neck pain.  Skin: Negative for rash.  Neurological: Negative for headaches.     Physical Exam Updated Vital Signs BP (!) 174/104 (BP Location: Right Arm)   Pulse (!) 108   Temp 99 F (37.2 C)   Resp (!) 23   Ht 5\' 10"  (1.778 m)   Wt 80.3 kg   SpO2 97%   BMI 25.40 kg/m   Physical Exam Vitals signs and nursing note reviewed.  Constitutional:      General: He is in acute distress.     Appearance: He is well-developed.     Comments: Patient is crying and weeping during  history and exam.   HENT:     Head: Normocephalic and atraumatic.  Eyes:     General:        Right eye: No discharge.        Left eye: No discharge.     Conjunctiva/sclera: Conjunctivae normal.  Neck:     Musculoskeletal: Normal range of motion and neck supple.  Cardiovascular:     Rate and Rhythm: Normal rate and regular rhythm.     Heart sounds: Normal heart sounds.  Pulmonary:     Effort: Pulmonary effort is normal. No respiratory distress.     Breath sounds: Normal breath sounds.  Abdominal:     Palpations: Abdomen is soft.     Tenderness: There is no abdominal tenderness.  Musculoskeletal:     Right shoulder: Normal.     Left shoulder: He exhibits decreased range of motion and tenderness. He exhibits no bony tenderness.     Left elbow: He exhibits normal range of motion and no swelling. Tenderness found.     Right wrist: Normal.     Left wrist: He exhibits tenderness. He exhibits normal range of motion and no bony tenderness.     Right hip: Normal.     Left hip: Normal.     Right knee: He exhibits decreased range of motion. He exhibits no swelling and no effusion. Tenderness (anteriorly) found. No medial joint line and no lateral joint line tenderness noted.     Left knee: Normal.     Right ankle: Normal.     Cervical back: He exhibits normal range of motion, no tenderness and no bony tenderness.     Left upper arm: He exhibits tenderness and bony tenderness.     Left forearm: Normal.       Legs:  Skin:    General: Skin is warm and dry.  Neurological:     Mental Status: He is alert.      ED Treatments / Results  Labs (all labs ordered are listed, but only abnormal results are displayed) Labs Reviewed  CBC WITH DIFFERENTIAL/PLATELET - Abnormal; Notable for the following components:      Result Value   RBC 4.17 (*)    Hemoglobin 12.2 (*)    HCT 38.7 (*)    RDW 17.4 (*)  Lymphs Abs 0.5 (*)    Monocytes Absolute 1.4 (*)    All other components within normal  limits  BASIC METABOLIC PANEL - Abnormal; Notable for the following components:   Glucose, Bld 154 (*)    BUN 38 (*)    Creatinine, Ser 1.34 (*)    GFR calc non Af Amer 56 (*)    All other components within normal limits  CK    EKG EKG Interpretation  Date/Time:  Sunday September 17 2019 17:57:33 EDT Ventricular Rate:  110 PR Interval:    QRS Duration: 161 QT Interval:  372 QTC Calculation: 504 R Axis:   -103 Text Interpretation:  Electronic ventricular pacemaker Confirmed by Lajean Saver [id number]) on 09/17/2019 5:59:44 PM   Radiology Dg Forearm Left  Result Date: 09/17/2019 CLINICAL DATA:  Arm pain after recent fall EXAM: LEFT HUMERUS - 2+ VIEW; LEFT FOREARM - 2 VIEW COMPARISON:  10/09/2018 FINDINGS: No acute fracture or dislocation. Degenerative changes of the glenohumeral and acromioclavicular joint. Alignment at the elbow is maintained. Frontal and lateral views of the radius and ulna demonstrate no acute fracture or dislocation. Mild degenerative changes within the carpus. No focal soft tissue abnormality. IMPRESSION: No acute osseous abnormality of the left humerus or forearm. Electronically Signed   By: Davina Poke M.D.   On: 09/17/2019 19:47   Dg Knee Complete 4 Views Right  Result Date: 09/17/2019 CLINICAL DATA:  Right knee pain after fall EXAM: RIGHT KNEE - COMPLETE 4+ VIEW COMPARISON:  09/10/2019, 09/11/2019 FINDINGS: No acute fracture or malalignment. Tricompartmental degenerative changes of the right knee are similar to prior. Small marginal erosions better characterized on recent CT. Persistent small right knee joint effusion. Bidirectional patellar enthesophytes. No focal soft tissue abnormality. No radiographic evidence of intra-articular air/gas. IMPRESSION: 1. No acute osseous abnormality of the right knee. 2. Small knee joint effusion, similar to prior. Electronically Signed   By: Davina Poke M.D.   On: 09/17/2019 19:50   Dg Humerus Left  Result Date:  09/17/2019 CLINICAL DATA:  Arm pain after recent fall EXAM: LEFT HUMERUS - 2+ VIEW; LEFT FOREARM - 2 VIEW COMPARISON:  10/09/2018 FINDINGS: No acute fracture or dislocation. Degenerative changes of the glenohumeral and acromioclavicular joint. Alignment at the elbow is maintained. Frontal and lateral views of the radius and ulna demonstrate no acute fracture or dislocation. Mild degenerative changes within the carpus. No focal soft tissue abnormality. IMPRESSION: No acute osseous abnormality of the left humerus or forearm. Electronically Signed   By: Davina Poke M.D.   On: 09/17/2019 19:47    Procedures Procedures (including critical care time)  Medications Ordered in ED Medications  oxyCODONE (Oxy IR/ROXICODONE) immediate release tablet 5 mg (5 mg Oral Given 09/17/19 1852)  oxyCODONE (Oxy IR/ROXICODONE) immediate release tablet 5 mg (5 mg Oral Given 09/17/19 2037)     Initial Impression / Assessment and Plan / ED Course  I have reviewed the triage vital signs and the nursing notes.  Pertinent labs & imaging results that were available during my care of the patient were reviewed by me and considered in my medical decision making (see chart for details).        Patient seen and examined.  Patient crying in pain during exam.  His main complaint today is pain related in nature attributed to his recent fall.  Patient is generally tender over his left arm but states that it is worse in the upper arm.  He also complains of pain over the  kneecap area.  Distal sensation intact.  No headache, altered mental status, vomiting, or other signs of closed head injury.  Given his comorbidities and reported duration of time on the ground, will check lab work including CK and image his extremities.  Oral oxycodone ordered.  Patient discussed with and seen by Dr. Ashok Cordia.   Vital signs reviewed and are as follows: BP (!) 174/104 (BP Location: Right Arm)   Pulse (!) 108   Temp 99 F (37.2 C)   Resp (!)  23   Ht 5\' 10"  (1.778 m)   Wt 80.3 kg   SpO2 97%   BMI 25.40 kg/m   Patient states that he had some pain relief from the medication given previously.  X-rays are negative.  Lab work-up is improved from previous admission.  Reviewed PT notes from inpatient admission.  Patient was having difficulty with mobility.  Walker was recommended.  Home PT was recommended but patient seemed to decline this.  Patient needed some positive reinforcement but was able to walk in the room with a walker with minimal assistance.  Patient has family at home to help him.  At this point, no indications for admission.  Strongly encourage PCP follow-up for continued management of his chronic issues.  Patient will be prescribed a short course of pain medication.  Use pain medication only under direct supervision at the lowest possible dose needed to control your pain.   BP (!) 162/94 (BP Location: Right Arm)   Pulse 84   Temp 99 F (37.2 C)   Resp 20   Ht 5\' 10"  (1.778 m)   Wt 80.3 kg   SpO2 99%   BMI 25.40 kg/m     Final Clinical Impressions(s) / ED Diagnoses   Final diagnoses:  Pain of left upper extremity  Acute pain of right knee   Patient with upper extremity and knee pain after a fall 2 days ago.  Patient was recently in the hospital for CHF exacerbation.  He seems to have very poor mobility at baseline.  This was assessed during previous hospitalization.  Patient was resistant to home health and PT at that time.  Imaging and lab work reassuring tonight.  Do not suspect that patient has any complications from being on the ground for several hours.  Patient has family to help him at home.  Patient seems to be at baseline here in the emergency department.  No indications for admission tonight.  ED Discharge Orders         Ordered    oxyCODONE (ROXICODONE) 5 MG immediate release tablet  Every 4 hours PRN     09/17/19 2033           Carlisle Cater, PA-C 09/17/19 2124    Lajean Saver, MD  09/17/19 2324

## 2019-09-17 NOTE — ED Notes (Signed)
Patient transported to X-ray 

## 2019-09-17 NOTE — ED Triage Notes (Signed)
Pt reports he fell down 3 steps on Friday walking in house and was unable to get up so laid on concrete for 4-5 hours. Pt c/o generalized pain. Pain worse in right knee and left shoulder. Pt has a pacemaker. He was d/c from [facility] the day before he fell

## 2019-09-19 ENCOUNTER — Telehealth: Payer: Self-pay | Admitting: *Deleted

## 2019-09-19 DIAGNOSIS — R296 Repeated falls: Secondary | ICD-10-CM

## 2019-09-19 NOTE — Telephone Encounter (Signed)
Copied from [facility] [phone]. Topic: Quick Communication - Rx Refill/Question >> Sep 19, 2019  2:45 PM Erick Blinks wrote: Pt's grand daughter Deliah [place] called to report that pt needs a new wheelchair  Best contact: [phone]

## 2019-09-19 NOTE — Telephone Encounter (Signed)
A new one? I've never even written for an old one. We can discuss at his upcoming appt. Ty.

## 2019-09-19 NOTE — Telephone Encounter (Signed)
Called the granddaughter and she can pickup order for wheel chair to take to a medical supply store. Ok will do?

## 2019-09-20 DIAGNOSIS — R531 Weakness: Secondary | ICD-10-CM | POA: Diagnosis not present

## 2019-09-20 DIAGNOSIS — M25519 Pain in unspecified shoulder: Secondary | ICD-10-CM | POA: Diagnosis not present

## 2019-09-20 DIAGNOSIS — M25561 Pain in right knee: Secondary | ICD-10-CM | POA: Diagnosis not present

## 2019-09-20 DIAGNOSIS — E785 Hyperlipidemia, unspecified: Secondary | ICD-10-CM | POA: Diagnosis not present

## 2019-09-20 DIAGNOSIS — G894 Chronic pain syndrome: Secondary | ICD-10-CM | POA: Diagnosis not present

## 2019-09-20 DIAGNOSIS — I10 Essential (primary) hypertension: Secondary | ICD-10-CM | POA: Diagnosis not present

## 2019-09-20 DIAGNOSIS — M5489 Other dorsalgia: Secondary | ICD-10-CM | POA: Diagnosis not present

## 2019-09-20 DIAGNOSIS — M25512 Pain in left shoulder: Secondary | ICD-10-CM | POA: Diagnosis not present

## 2019-09-20 DIAGNOSIS — M47812 Spondylosis without myelopathy or radiculopathy, cervical region: Secondary | ICD-10-CM | POA: Diagnosis not present

## 2019-09-20 DIAGNOSIS — R52 Pain, unspecified: Secondary | ICD-10-CM | POA: Diagnosis not present

## 2019-09-20 DIAGNOSIS — M13812 Other specified arthritis, left shoulder: Secondary | ICD-10-CM | POA: Diagnosis not present

## 2019-09-20 DIAGNOSIS — M545 Low back pain: Secondary | ICD-10-CM | POA: Diagnosis not present

## 2019-09-20 NOTE — Telephone Encounter (Signed)
Called informed the granddaughter of PCP response to request. She did verbalized understanding

## 2019-09-21 DIAGNOSIS — E785 Hyperlipidemia, unspecified: Secondary | ICD-10-CM | POA: Diagnosis not present

## 2019-09-21 DIAGNOSIS — I10 Essential (primary) hypertension: Secondary | ICD-10-CM | POA: Diagnosis not present

## 2019-09-22 ENCOUNTER — Ambulatory Visit: Payer: Medicare Other | Admitting: Family Medicine

## 2019-09-26 ENCOUNTER — Ambulatory Visit (INDEPENDENT_AMBULATORY_CARE_PROVIDER_SITE_OTHER): Payer: Medicare Other | Admitting: Family Medicine

## 2019-09-26 ENCOUNTER — Encounter: Payer: Self-pay | Admitting: Family Medicine

## 2019-09-26 ENCOUNTER — Other Ambulatory Visit: Payer: Self-pay

## 2019-09-26 VITALS — BP 132/80 | HR 79 | Temp 98.4°F

## 2019-09-26 DIAGNOSIS — M25562 Pain in left knee: Secondary | ICD-10-CM | POA: Diagnosis not present

## 2019-09-26 DIAGNOSIS — E1165 Type 2 diabetes mellitus with hyperglycemia: Secondary | ICD-10-CM | POA: Diagnosis not present

## 2019-09-26 DIAGNOSIS — G8929 Other chronic pain: Secondary | ICD-10-CM | POA: Diagnosis not present

## 2019-09-26 DIAGNOSIS — D649 Anemia, unspecified: Secondary | ICD-10-CM | POA: Diagnosis not present

## 2019-09-26 DIAGNOSIS — Z09 Encounter for follow-up examination after completed treatment for conditions other than malignant neoplasm: Secondary | ICD-10-CM | POA: Diagnosis not present

## 2019-09-26 DIAGNOSIS — R296 Repeated falls: Secondary | ICD-10-CM | POA: Diagnosis not present

## 2019-09-26 DIAGNOSIS — I509 Heart failure, unspecified: Secondary | ICD-10-CM | POA: Diagnosis not present

## 2019-09-26 MED ORDER — MELOXICAM 15 MG PO TABS: 15.0000 mg | ORAL_TABLET | Freq: Every day | ORAL | 0 refills | Status: DC

## 2019-09-26 MED ORDER — OXYCODONE HCL 5 MG PO TABS: 5.0000 mg | ORAL_TABLET | Freq: Four times a day (QID) | ORAL | 0 refills | Status: DC | PRN

## 2019-09-26 NOTE — Patient Instructions (Signed)
If you do not hear anything about your referral in the next 1-2 weeks, call our office and ask for an update.  Give [place] 2-3 business days to get the results of your labs back.   Keep the diet clean and stay active.  Mind the salt intake.  Let us know if you need anything.

## 2019-09-26 NOTE — Progress Notes (Addendum)
Chief Complaint  Patient presents with  . Follow-up    Subjective: Patient is a 63 y.o. male here for hosp f/u. Here w granddaughter.  Patient was hospitalized on 2 separate occasions since her last visit.  One was on 08/26/2019 and he was discharged on 9/1.  This was for a CHF exacerbation.  He is readmitted on 9/13 and discharged on 9/17.  Reports feeling better from that standpoint but he did have a fall several days after the hospitalization.  He fell again and is currently following up for that.  He is having extreme pain in his right lower extremity and left lower extremity, worse on the left.  He did have a negative imaging of his left lower extremity and left upper extremity.  He has been trying to move but is very limited secondary to pain.  He has not been using anything at home for this.  No weakness.  There is no swelling or redness, no bruising.  He has a history of uncontrolled gout and states it does not feel like that.  He is not having any fevers, shortness of breath, or chest pain.   ROS: Heart: Denies chest pain  Lungs: Denies SOB  Skin: No redness MSK: +knee and shoulder pain on L Neuro: No tingling Const: no fevers Psych: +depressed from situation Eyes: no vision changes Endo: No wt loss GU: no oliguria  Past Medical History:  Diagnosis Date  . CAD (coronary artery disease) 2010   3v CABG  . CHF (congestive heart failure) ([place]) 01/26/2012  . CKD (chronic kidney disease) 02/19/2012  . COPD with chronic bronchitis ([place]) 08/26/2016  . Diabetes mellitus type 2, uncontrolled ([place]) 01/26/2012  . Elevated liver function tests 01/26/2012  . Gout 01/26/2012  . Hx of CABG 2010   RCA Stent 2008, CABG x 2 [facility] 2010  . Hypercholesterolemia 07/08/2016  . Hypertension 09/04/2014  . Ischemic cardiomyopathy 11/15/2014  . Kidney stone 10/25/2015  . Lipid disorder 01/26/2012  . Noncompliance 10/18/2018  . NSTEMI (non-ST elevated myocardial infarction) ([place]) 07/08/2016  . ST  elevation myocardial infarction (STEMI) ([place]) 2010  . Unstable angina ([place]) 11/14/2014   Past Surgical History:  Procedure Laterality Date  . BIV ICD INSERTION CRT-D N/A 10/21/2017   Procedure: BIV ICD INSERTION CRT-D;  Surgeon: Constance Haw, MD;  Location: [facility];  Service: Cardiovascular;  Laterality: N/A;  . CORONARY ARTERY BYPASS GRAFT  2010  . KNEE SURGERY    . RIGHT/LEFT HEART CATH AND CORONARY ANGIOGRAPHY N/A 04/15/2017   Procedure: Right/Left Heart Cath and Coronary Angiography;  Surgeon: Larey Dresser, MD;  Location: [facility];  Service: Cardiovascular;  Laterality: N/A;  . ROTATOR CUFF REPAIR    . WRIST SURGERY     Family History  Problem Relation Age of Onset  . Hypertension Mother   . Diabetes Mother   . Hyperlipidemia Mother   . Kidney failure Mother   . Emphysema Father    Allergies  Allergen Reactions  . Amitriptyline Other (See Comments)    drowsy  . Hydrocodone Itching  . Tramadol Nausea Only    Objective: BP 132/80 (BP Location: Left Arm, Patient Position: Sitting, Cuff Size: Normal)   Pulse 79   Temp 98.4 F (36.9 C) (Temporal)   SpO2 93%  General: Awake, appears stated age HEENT: MMM, EOMi Heart: RRR, 3+ LE edema tapering towards knees b/l, no bruits Lungs: CTAB, no rales, wheezes or rhonchi. No accessory muscle use MSK: +ttp  over the L knee jt line, mild effusion noted, no excessive warmth or erythema Neuro: non-ambulatory Psych: Age appropriate judgment and insight, normal affect and mood  Assessment and Plan: Frequent falls - Plan: DME Wheelchair manual, Ambulatory referral to [facility]  Chronic pain of left knee - Plan: Ambulatory referral to [facility], meloxicam (MOBIC) 15 MG tablet, oxyCODONE (OXY IR/ROXICODONE) 5 MG immediate release tablet, Ambulatory referral to [facility] discharge follow-up - Plan: Comprehensive metabolic panel  Uncontrolled type 2 diabetes mellitus with hyperglycemia  ([place]) - Plan: Hemoglobin A1c  Chronic congestive heart failure, unspecified heart failure type (HCC)  Anemia, unspecified type - Plan: CBC  1-we will get a wheelchair for now as I believe pain is also interfering with his gait. Given his pain, should be motorized. Due to deconditioning and frequent falls, the patient requires frequent position changes so as not to develop or exacerbate an ulcer. The patient has a medical condition which requires positioning of the body in ways not feasible with an ordinary bed and would benefit from a hospital bed.  In my medical opinion, his mobility issues cannot be resolved with crutches, cane, or a walker.  His wife is at home with him and can push him and his granddaughter is also able to help.  He can safely propel based off of my examination.  Home health/physical therapy to help with this as well.  I do believe he needs to rehab and what he has been doing on his old is not sufficient to be safely walking again. Pt requires maximum assistance for all transfers due to chronic pain syndrome/low pain tolerance and frequent falls. 2-I would like the opinion of an orthopedic surgeon regarding his left knee.  This is giving him issues for many months and I question whether just gout is involved.  The pain is out of proportion to his history and lack of positive imaging.  Will prescribe daily anti-inflammatory in the meantime and recommend Tylenol/oxycodone as needed.  I did counsel him that I do not want him using large quantities of oxycodone and only use it if he absolutely cannot handle the pain. 3-follow-up on labs following his discharge from the hospital 4-follow-up on A1c which is relatively well controlled I will see him in 3 months to recheck. The patient voiced understanding and agreement to the plan.  Lamar, DO 09/26/19  4:25 PM

## 2019-09-27 ENCOUNTER — Other Ambulatory Visit: Payer: Self-pay | Admitting: Family Medicine

## 2019-09-27 DIAGNOSIS — R296 Repeated falls: Secondary | ICD-10-CM

## 2019-09-27 LAB — CBC
HCT: 30.5 % — ABNORMAL LOW (ref 39.0–52.0)
Hemoglobin: 10 g/dL — ABNORMAL LOW (ref 13.0–17.0)
MCHC: 32.9 g/dL (ref 30.0–36.0)
MCV: 92.5 fl (ref 78.0–100.0)
Platelets: 221 10*3/uL (ref 150.0–400.0)
RBC: 3.3 Mil/uL — ABNORMAL LOW (ref 4.22–5.81)
RDW: 16.9 % — ABNORMAL HIGH (ref 11.5–15.5)
WBC: 7.7 10*3/uL (ref 4.0–10.5)

## 2019-09-27 LAB — COMPREHENSIVE METABOLIC PANEL
ALT: 11 U/L (ref 0–53)
AST: 23 U/L (ref 0–37)
Albumin: 3.1 g/dL — ABNORMAL LOW (ref 3.5–5.2)
Alkaline Phosphatase: 115 U/L (ref 39–117)
BUN: 60 mg/dL — ABNORMAL HIGH (ref 6–23)
CO2: 28 mEq/L (ref 19–32)
Calcium: 9.2 mg/dL (ref 8.4–10.5)
Chloride: 97 mEq/L (ref 96–112)
Creatinine, Ser: 1.81 mg/dL — ABNORMAL HIGH (ref 0.40–1.50)
GFR: 45.9 mL/min — ABNORMAL LOW (ref >60.00)
Glucose, Bld: 148 mg/dL — ABNORMAL HIGH (ref 70–99)
Potassium: 4.4 mEq/L (ref 3.5–5.1)
Sodium: 135 mEq/L (ref 135–145)
Total Bilirubin: 2 mg/dL — ABNORMAL HIGH (ref 0.2–1.2)
Total Protein: 7.6 g/dL (ref 6.0–8.3)

## 2019-09-27 LAB — HEMOGLOBIN A1C: Hgb A1c MFr Bld: 7.3 % — ABNORMAL HIGH (ref 4.6–6.5)

## 2019-10-02 ENCOUNTER — Ambulatory Visit: Payer: Self-pay | Admitting: Family Medicine

## 2019-10-02 DIAGNOSIS — W1789XA Other fall from one level to another, initial encounter: Secondary | ICD-10-CM | POA: Diagnosis not present

## 2019-10-02 DIAGNOSIS — R9431 Abnormal electrocardiogram [ECG] [EKG]: Secondary | ICD-10-CM | POA: Diagnosis not present

## 2019-10-02 DIAGNOSIS — M25562 Pain in left knee: Secondary | ICD-10-CM | POA: Diagnosis not present

## 2019-10-02 DIAGNOSIS — R069 Unspecified abnormalities of breathing: Secondary | ICD-10-CM | POA: Diagnosis not present

## 2019-10-02 DIAGNOSIS — S4992XA Unspecified injury of left shoulder and upper arm, initial encounter: Secondary | ICD-10-CM | POA: Diagnosis not present

## 2019-10-02 DIAGNOSIS — S4991XA Unspecified injury of right shoulder and upper arm, initial encounter: Secondary | ICD-10-CM | POA: Diagnosis not present

## 2019-10-02 DIAGNOSIS — I517 Cardiomegaly: Secondary | ICD-10-CM | POA: Diagnosis not present

## 2019-10-02 DIAGNOSIS — S8992XA Unspecified injury of left lower leg, initial encounter: Secondary | ICD-10-CM | POA: Diagnosis not present

## 2019-10-02 DIAGNOSIS — M79605 Pain in left leg: Secondary | ICD-10-CM | POA: Diagnosis not present

## 2019-10-02 DIAGNOSIS — I959 Hypotension, unspecified: Secondary | ICD-10-CM | POA: Diagnosis not present

## 2019-10-02 DIAGNOSIS — R748 Abnormal levels of other serum enzymes: Secondary | ICD-10-CM | POA: Diagnosis not present

## 2019-10-02 DIAGNOSIS — Y999 Unspecified external cause status: Secondary | ICD-10-CM | POA: Diagnosis not present

## 2019-10-02 DIAGNOSIS — R609 Edema, unspecified: Secondary | ICD-10-CM | POA: Diagnosis not present

## 2019-10-02 DIAGNOSIS — M25511 Pain in right shoulder: Secondary | ICD-10-CM | POA: Diagnosis not present

## 2019-10-02 DIAGNOSIS — Z743 Need for continuous supervision: Secondary | ICD-10-CM | POA: Diagnosis not present

## 2019-10-02 DIAGNOSIS — R0602 Shortness of breath: Secondary | ICD-10-CM | POA: Diagnosis not present

## 2019-10-02 DIAGNOSIS — R531 Weakness: Secondary | ICD-10-CM | POA: Diagnosis not present

## 2019-10-02 DIAGNOSIS — R600 Localized edema: Secondary | ICD-10-CM | POA: Diagnosis not present

## 2019-10-02 DIAGNOSIS — I878 Other specified disorders of veins: Secondary | ICD-10-CM | POA: Diagnosis not present

## 2019-10-02 DIAGNOSIS — M25512 Pain in left shoulder: Secondary | ICD-10-CM | POA: Diagnosis not present

## 2019-10-02 DIAGNOSIS — R279 Unspecified lack of coordination: Secondary | ICD-10-CM | POA: Diagnosis not present

## 2019-10-02 DIAGNOSIS — R079 Chest pain, unspecified: Secondary | ICD-10-CM | POA: Diagnosis not present

## 2019-10-02 DIAGNOSIS — D649 Anemia, unspecified: Secondary | ICD-10-CM | POA: Diagnosis not present

## 2019-10-02 DIAGNOSIS — M19012 Primary osteoarthritis, left shoulder: Secondary | ICD-10-CM | POA: Diagnosis not present

## 2019-10-02 DIAGNOSIS — S299XXA Unspecified injury of thorax, initial encounter: Secondary | ICD-10-CM | POA: Diagnosis not present

## 2019-10-02 NOTE — Telephone Encounter (Signed)
FYI

## 2019-10-02 NOTE — Telephone Encounter (Signed)
'  Clair Gulling' P.T. with [facility] calling, patient present.  States pt hospitalized 09/10/2019  CHF. Discharged, went to ED s/p fall at home 09/17/2019 and again on 09/20/2019.  P.T. reports pt was max assist to stand, states O2 sats on RA 65-72%. BP 90/60. Reports 2+ bilateral lower ext edema. P.T. states "Gurgling all lung fields."  Wife reports pt has been bedridden since last Thursday. Advised ED, P.T. states will call EMS for transport.  Reason for Disposition . [1] SEVERE weakness (i.e., unable to walk or barely able to walk, requires support) AND [2] new onset or worsening  Protocols used: WEAKNESS (GENERALIZED) AND FATIGUE-A-AH

## 2019-10-03 ENCOUNTER — Telehealth: Payer: Self-pay | Admitting: Family Medicine

## 2019-10-03 DIAGNOSIS — M1611 Unilateral primary osteoarthritis, right hip: Secondary | ICD-10-CM | POA: Diagnosis not present

## 2019-10-03 DIAGNOSIS — D631 Anemia in chronic kidney disease: Secondary | ICD-10-CM | POA: Diagnosis not present

## 2019-10-03 DIAGNOSIS — Z7984 Long term (current) use of oral hypoglycemic drugs: Secondary | ICD-10-CM | POA: Diagnosis not present

## 2019-10-03 DIAGNOSIS — M25562 Pain in left knee: Secondary | ICD-10-CM | POA: Diagnosis not present

## 2019-10-03 DIAGNOSIS — Z87891 Personal history of nicotine dependence: Secondary | ICD-10-CM | POA: Diagnosis not present

## 2019-10-03 DIAGNOSIS — I130 Hypertensive heart and chronic kidney disease with heart failure and stage 1 through stage 4 chronic kidney disease, or unspecified chronic kidney disease: Secondary | ICD-10-CM | POA: Diagnosis not present

## 2019-10-03 DIAGNOSIS — J449 Chronic obstructive pulmonary disease, unspecified: Secondary | ICD-10-CM | POA: Diagnosis not present

## 2019-10-03 DIAGNOSIS — E7800 Pure hypercholesterolemia, unspecified: Secondary | ICD-10-CM | POA: Diagnosis not present

## 2019-10-03 DIAGNOSIS — I252 Old myocardial infarction: Secondary | ICD-10-CM | POA: Diagnosis not present

## 2019-10-03 DIAGNOSIS — E1165 Type 2 diabetes mellitus with hyperglycemia: Secondary | ICD-10-CM | POA: Diagnosis not present

## 2019-10-03 DIAGNOSIS — E876 Hypokalemia: Secondary | ICD-10-CM | POA: Diagnosis not present

## 2019-10-03 DIAGNOSIS — I25110 Atherosclerotic heart disease of native coronary artery with unstable angina pectoris: Secondary | ICD-10-CM | POA: Diagnosis not present

## 2019-10-03 DIAGNOSIS — I5023 Acute on chronic systolic (congestive) heart failure: Secondary | ICD-10-CM | POA: Diagnosis not present

## 2019-10-03 DIAGNOSIS — E1140 Type 2 diabetes mellitus with diabetic neuropathy, unspecified: Secondary | ICD-10-CM | POA: Diagnosis not present

## 2019-10-03 DIAGNOSIS — M1711 Unilateral primary osteoarthritis, right knee: Secondary | ICD-10-CM | POA: Diagnosis not present

## 2019-10-03 DIAGNOSIS — Z951 Presence of aortocoronary bypass graft: Secondary | ICD-10-CM | POA: Diagnosis not present

## 2019-10-03 DIAGNOSIS — N183 Chronic kidney disease, stage 3 unspecified: Secondary | ICD-10-CM | POA: Diagnosis not present

## 2019-10-03 DIAGNOSIS — R9431 Abnormal electrocardiogram [ECG] [EKG]: Secondary | ICD-10-CM | POA: Diagnosis not present

## 2019-10-03 DIAGNOSIS — E1122 Type 2 diabetes mellitus with diabetic chronic kidney disease: Secondary | ICD-10-CM | POA: Diagnosis not present

## 2019-10-03 DIAGNOSIS — R296 Repeated falls: Secondary | ICD-10-CM

## 2019-10-03 DIAGNOSIS — K219 Gastro-esophageal reflux disease without esophagitis: Secondary | ICD-10-CM | POA: Diagnosis not present

## 2019-10-03 DIAGNOSIS — M1030 Gout due to renal impairment, unspecified site: Secondary | ICD-10-CM | POA: Diagnosis not present

## 2019-10-03 DIAGNOSIS — Z95810 Presence of automatic (implantable) cardiac defibrillator: Secondary | ICD-10-CM | POA: Diagnosis not present

## 2019-10-03 DIAGNOSIS — Z9181 History of falling: Secondary | ICD-10-CM | POA: Diagnosis not present

## 2019-10-03 DIAGNOSIS — E785 Hyperlipidemia, unspecified: Secondary | ICD-10-CM | POA: Diagnosis not present

## 2019-10-03 DIAGNOSIS — G8929 Other chronic pain: Secondary | ICD-10-CM | POA: Diagnosis not present

## 2019-10-03 NOTE — Telephone Encounter (Signed)
Home Health Verbal Orders - Caller/Agency: Enid Baas (PT)/ [facility] Callback Number: [phone] Requesting OT/PT/Skilled Nursing/Social Work/Speech Therapy: PT, Skilled nursing, OT Frequency:  PT: 2 week 3, 1 week 6.  Nursing: evaluation and referral. Home Health aide as well for 2x a week for 3 weeks OT: evaluation  PT would also like to make PCP aware that they are needing to be given an updated med list faxed to [phone]. PT would also like to inform PCP that the patient is moderately lethargic and is about to be awoken, however does fall right back asleep.

## 2019-10-03 NOTE — Telephone Encounter (Signed)
That's fine

## 2019-10-03 NOTE — Telephone Encounter (Signed)
Called left message to call back 

## 2019-10-03 NOTE — Addendum Note (Signed)
Addended by: Sharon Seller B on: 10/03/2019 04:14 PM   Modules accepted: Orders

## 2019-10-03 NOTE — Telephone Encounter (Signed)
OK for all. Find out how much oxycodone he has been taking. He was quite alert at his last visit.

## 2019-10-03 NOTE — Telephone Encounter (Signed)
Faxed both request to [facility].

## 2019-10-03 NOTE — Telephone Encounter (Signed)
Patient's wife returning call from [place]. Patient's wife was also wondering if an order for a hospital bed and bedside commode could be added to the script. Please advise.

## 2019-10-03 NOTE — Telephone Encounter (Signed)
Please advise on all information

## 2019-10-03 NOTE — Telephone Encounter (Signed)
Ok to add hospital bed and bedside commode

## 2019-10-03 NOTE — Telephone Encounter (Signed)
Pt wife called and stated that she would like a call back from the nurse regarding wheel chair. Please advise   [phone]

## 2019-10-03 NOTE — Telephone Encounter (Signed)
HHRN informed of ok He stated the family told him that he was getting the oxycodone as directed every 6 hours. The [facility] did state after seeing him yesterday he did tell them to take to the ED and they did , he returned home with no changes.

## 2019-10-04 NOTE — Telephone Encounter (Signed)
Ms Darien Ramus called to request a [facility] Lift, Bed pads and gait belt for patient to be ordered from [facility] asking for it to be delivered to the address on file. Ms Kenton Kingfisher can be reached at Ph#  [phone]

## 2019-10-04 NOTE — Telephone Encounter (Signed)
See if they will take a verbal. If not, can do a misc medical supply order. Ty.

## 2019-10-04 NOTE — Telephone Encounter (Signed)
[place] [phone]) to order request for Gait Belt, Harrel Lemon lift  And bed pads had to leave her a detailed message to call back on Friday as will not be in the office tomorrow. Left message stating PCP wanted to give a verbal order for these items and needed advisement.

## 2019-10-04 NOTE — Telephone Encounter (Signed)
Not sure how to order these they are not under DME

## 2019-10-04 NOTE — Telephone Encounter (Signed)
OK 

## 2019-10-05 DIAGNOSIS — I5023 Acute on chronic systolic (congestive) heart failure: Secondary | ICD-10-CM | POA: Diagnosis not present

## 2019-10-05 DIAGNOSIS — I25110 Atherosclerotic heart disease of native coronary artery with unstable angina pectoris: Secondary | ICD-10-CM | POA: Diagnosis not present

## 2019-10-05 DIAGNOSIS — I130 Hypertensive heart and chronic kidney disease with heart failure and stage 1 through stage 4 chronic kidney disease, or unspecified chronic kidney disease: Secondary | ICD-10-CM | POA: Diagnosis not present

## 2019-10-05 DIAGNOSIS — I252 Old myocardial infarction: Secondary | ICD-10-CM | POA: Diagnosis not present

## 2019-10-05 DIAGNOSIS — E1122 Type 2 diabetes mellitus with diabetic chronic kidney disease: Secondary | ICD-10-CM | POA: Diagnosis not present

## 2019-10-05 DIAGNOSIS — N183 Chronic kidney disease, stage 3 unspecified: Secondary | ICD-10-CM | POA: Diagnosis not present

## 2019-10-06 ENCOUNTER — Ambulatory Visit: Payer: Medicare Other | Admitting: Neurology

## 2019-10-06 MED ORDER — GAIT/TRANSFER BELT MISC: 0 refills | Status: AC

## 2019-10-06 NOTE — Telephone Encounter (Signed)
Melissa from [facility] returned [place] call and stated a verbal can not be given but it can be put in epic or faxed to [phone]/ please advise

## 2019-10-06 NOTE — Telephone Encounter (Signed)
Orders complete///faxed over order to [place]. Called as well to inform orders in [facility]

## 2019-10-06 NOTE — Addendum Note (Signed)
Addended by: Sharon Seller B on: 10/06/2019 01:43 PM   Modules accepted: Orders

## 2019-10-07 DIAGNOSIS — I130 Hypertensive heart and chronic kidney disease with heart failure and stage 1 through stage 4 chronic kidney disease, or unspecified chronic kidney disease: Secondary | ICD-10-CM | POA: Diagnosis not present

## 2019-10-07 DIAGNOSIS — E1122 Type 2 diabetes mellitus with diabetic chronic kidney disease: Secondary | ICD-10-CM | POA: Diagnosis not present

## 2019-10-07 DIAGNOSIS — I25110 Atherosclerotic heart disease of native coronary artery with unstable angina pectoris: Secondary | ICD-10-CM | POA: Diagnosis not present

## 2019-10-07 DIAGNOSIS — I5023 Acute on chronic systolic (congestive) heart failure: Secondary | ICD-10-CM | POA: Diagnosis not present

## 2019-10-07 DIAGNOSIS — N183 Chronic kidney disease, stage 3 unspecified: Secondary | ICD-10-CM | POA: Diagnosis not present

## 2019-10-07 DIAGNOSIS — I252 Old myocardial infarction: Secondary | ICD-10-CM | POA: Diagnosis not present

## 2019-10-09 ENCOUNTER — Telehealth: Payer: Self-pay | Admitting: Family Medicine

## 2019-10-09 DIAGNOSIS — I130 Hypertensive heart and chronic kidney disease with heart failure and stage 1 through stage 4 chronic kidney disease, or unspecified chronic kidney disease: Secondary | ICD-10-CM | POA: Diagnosis not present

## 2019-10-09 DIAGNOSIS — E1122 Type 2 diabetes mellitus with diabetic chronic kidney disease: Secondary | ICD-10-CM | POA: Diagnosis not present

## 2019-10-09 DIAGNOSIS — I5023 Acute on chronic systolic (congestive) heart failure: Secondary | ICD-10-CM | POA: Diagnosis not present

## 2019-10-09 DIAGNOSIS — I252 Old myocardial infarction: Secondary | ICD-10-CM | POA: Diagnosis not present

## 2019-10-09 DIAGNOSIS — I25110 Atherosclerotic heart disease of native coronary artery with unstable angina pectoris: Secondary | ICD-10-CM | POA: Diagnosis not present

## 2019-10-09 DIAGNOSIS — N183 Chronic kidney disease, stage 3 unspecified: Secondary | ICD-10-CM | POA: Diagnosis not present

## 2019-10-09 NOTE — Telephone Encounter (Signed)
Called to confirm with [place] does not pay for bed liners. Called the home and informed the patients wife of this information and they would need to purchase this item from their pharmacy/order them on line.  The wife stated he has already taken all the Oxycodone given and the patient stated they are not helping and He (the patient) requested something stronger.

## 2019-10-09 NOTE — Telephone Encounter (Signed)
Called left message to call back 

## 2019-10-09 NOTE — Telephone Encounter (Signed)
There is nothing stronger that I would give on an outpatient basis. Is he taking meloxicam? What is his status of the orthopedic surgery referral?

## 2019-10-09 NOTE — Telephone Encounter (Signed)
Patrick Stewart is calling they only accept [place] medicaid for incontinence  Supplies. Please advice

## 2019-10-10 ENCOUNTER — Telehealth: Payer: Self-pay | Admitting: Neurology

## 2019-10-10 NOTE — Telephone Encounter (Signed)
Marlowe Kays from [facility] PT left msg with after hours about patient missed therapy appt and needing a call back from nurse.

## 2019-10-10 NOTE — Telephone Encounter (Signed)
He is taking the meloxicam and said they will just continue taking it. They have not heard back from ortho yet..will followup with referral coordinator on that referral.

## 2019-10-10 NOTE — Telephone Encounter (Signed)
No answer at 834 to [facility]

## 2019-10-10 NOTE — Telephone Encounter (Signed)
Left message for Patrick Stewart to call back from [facility].

## 2019-10-10 NOTE — Telephone Encounter (Signed)
Called left message to call back 

## 2019-10-11 DIAGNOSIS — I5023 Acute on chronic systolic (congestive) heart failure: Secondary | ICD-10-CM | POA: Diagnosis not present

## 2019-10-11 DIAGNOSIS — I130 Hypertensive heart and chronic kidney disease with heart failure and stage 1 through stage 4 chronic kidney disease, or unspecified chronic kidney disease: Secondary | ICD-10-CM | POA: Diagnosis not present

## 2019-10-11 DIAGNOSIS — I25110 Atherosclerotic heart disease of native coronary artery with unstable angina pectoris: Secondary | ICD-10-CM | POA: Diagnosis not present

## 2019-10-11 DIAGNOSIS — E1122 Type 2 diabetes mellitus with diabetic chronic kidney disease: Secondary | ICD-10-CM | POA: Diagnosis not present

## 2019-10-11 DIAGNOSIS — N183 Chronic kidney disease, stage 3 unspecified: Secondary | ICD-10-CM | POA: Diagnosis not present

## 2019-10-11 DIAGNOSIS — I252 Old myocardial infarction: Secondary | ICD-10-CM | POA: Diagnosis not present

## 2019-10-11 NOTE — Telephone Encounter (Signed)
Called and gave them [facility] number to call to schedule his appt  [phone])

## 2019-10-12 DIAGNOSIS — E1122 Type 2 diabetes mellitus with diabetic chronic kidney disease: Secondary | ICD-10-CM | POA: Diagnosis not present

## 2019-10-12 DIAGNOSIS — I25110 Atherosclerotic heart disease of native coronary artery with unstable angina pectoris: Secondary | ICD-10-CM | POA: Diagnosis not present

## 2019-10-12 DIAGNOSIS — I130 Hypertensive heart and chronic kidney disease with heart failure and stage 1 through stage 4 chronic kidney disease, or unspecified chronic kidney disease: Secondary | ICD-10-CM | POA: Diagnosis not present

## 2019-10-12 DIAGNOSIS — N183 Chronic kidney disease, stage 3 unspecified: Secondary | ICD-10-CM | POA: Diagnosis not present

## 2019-10-12 DIAGNOSIS — I5023 Acute on chronic systolic (congestive) heart failure: Secondary | ICD-10-CM | POA: Diagnosis not present

## 2019-10-12 DIAGNOSIS — I252 Old myocardial infarction: Secondary | ICD-10-CM | POA: Diagnosis not present

## 2019-10-13 DIAGNOSIS — Z95810 Presence of automatic (implantable) cardiac defibrillator: Secondary | ICD-10-CM | POA: Diagnosis not present

## 2019-10-13 DIAGNOSIS — N183 Chronic kidney disease, stage 3 unspecified: Secondary | ICD-10-CM | POA: Diagnosis present

## 2019-10-13 DIAGNOSIS — I110 Hypertensive heart disease with heart failure: Secondary | ICD-10-CM | POA: Diagnosis not present

## 2019-10-13 DIAGNOSIS — Z7401 Bed confinement status: Secondary | ICD-10-CM | POA: Diagnosis not present

## 2019-10-13 DIAGNOSIS — I130 Hypertensive heart and chronic kidney disease with heart failure and stage 1 through stage 4 chronic kidney disease, or unspecified chronic kidney disease: Secondary | ICD-10-CM | POA: Diagnosis not present

## 2019-10-13 DIAGNOSIS — R05 Cough: Secondary | ICD-10-CM | POA: Diagnosis not present

## 2019-10-13 DIAGNOSIS — Z209 Contact with and (suspected) exposure to unspecified communicable disease: Secondary | ICD-10-CM | POA: Diagnosis not present

## 2019-10-13 DIAGNOSIS — Z20828 Contact with and (suspected) exposure to other viral communicable diseases: Secondary | ICD-10-CM | POA: Diagnosis present

## 2019-10-13 DIAGNOSIS — J441 Chronic obstructive pulmonary disease with (acute) exacerbation: Secondary | ICD-10-CM | POA: Diagnosis present

## 2019-10-13 DIAGNOSIS — I2510 Atherosclerotic heart disease of native coronary artery without angina pectoris: Secondary | ICD-10-CM | POA: Diagnosis present

## 2019-10-13 DIAGNOSIS — L89322 Pressure ulcer of left buttock, stage 2: Secondary | ICD-10-CM | POA: Diagnosis present

## 2019-10-13 DIAGNOSIS — E1165 Type 2 diabetes mellitus with hyperglycemia: Secondary | ICD-10-CM | POA: Diagnosis not present

## 2019-10-13 DIAGNOSIS — E785 Hyperlipidemia, unspecified: Secondary | ICD-10-CM | POA: Diagnosis present

## 2019-10-13 DIAGNOSIS — E1122 Type 2 diabetes mellitus with diabetic chronic kidney disease: Secondary | ICD-10-CM | POA: Diagnosis present

## 2019-10-13 DIAGNOSIS — K219 Gastro-esophageal reflux disease without esophagitis: Secondary | ICD-10-CM | POA: Diagnosis present

## 2019-10-13 DIAGNOSIS — I10 Essential (primary) hypertension: Secondary | ICD-10-CM | POA: Diagnosis not present

## 2019-10-13 DIAGNOSIS — N179 Acute kidney failure, unspecified: Secondary | ICD-10-CM | POA: Diagnosis present

## 2019-10-13 DIAGNOSIS — E875 Hyperkalemia: Secondary | ICD-10-CM | POA: Diagnosis present

## 2019-10-13 DIAGNOSIS — R0600 Dyspnea, unspecified: Secondary | ICD-10-CM | POA: Diagnosis not present

## 2019-10-13 DIAGNOSIS — I5023 Acute on chronic systolic (congestive) heart failure: Secondary | ICD-10-CM | POA: Diagnosis present

## 2019-10-13 DIAGNOSIS — R0602 Shortness of breath: Secondary | ICD-10-CM | POA: Diagnosis not present

## 2019-10-13 DIAGNOSIS — J449 Chronic obstructive pulmonary disease, unspecified: Secondary | ICD-10-CM | POA: Diagnosis not present

## 2019-10-13 DIAGNOSIS — E871 Hypo-osmolality and hyponatremia: Secondary | ICD-10-CM | POA: Diagnosis present

## 2019-10-13 DIAGNOSIS — L89153 Pressure ulcer of sacral region, stage 3: Secondary | ICD-10-CM | POA: Diagnosis not present

## 2019-10-13 DIAGNOSIS — I492 Junctional premature depolarization: Secondary | ICD-10-CM | POA: Diagnosis not present

## 2019-10-13 DIAGNOSIS — I5021 Acute systolic (congestive) heart failure: Secondary | ICD-10-CM | POA: Diagnosis not present

## 2019-10-13 DIAGNOSIS — R0689 Other abnormalities of breathing: Secondary | ICD-10-CM | POA: Diagnosis not present

## 2019-10-13 DIAGNOSIS — M2550 Pain in unspecified joint: Secondary | ICD-10-CM | POA: Diagnosis not present

## 2019-10-13 DIAGNOSIS — Z951 Presence of aortocoronary bypass graft: Secondary | ICD-10-CM | POA: Diagnosis not present

## 2019-10-13 DIAGNOSIS — L89312 Pressure ulcer of right buttock, stage 2: Secondary | ICD-10-CM | POA: Diagnosis present

## 2019-10-17 ENCOUNTER — Encounter: Payer: Medicare Other | Admitting: *Deleted

## 2019-10-18 ENCOUNTER — Other Ambulatory Visit: Payer: Self-pay | Admitting: Family Medicine

## 2019-10-18 DIAGNOSIS — I252 Old myocardial infarction: Secondary | ICD-10-CM | POA: Diagnosis not present

## 2019-10-18 DIAGNOSIS — E1122 Type 2 diabetes mellitus with diabetic chronic kidney disease: Secondary | ICD-10-CM | POA: Diagnosis not present

## 2019-10-18 DIAGNOSIS — M25562 Pain in left knee: Secondary | ICD-10-CM

## 2019-10-18 DIAGNOSIS — G8929 Other chronic pain: Secondary | ICD-10-CM

## 2019-10-18 DIAGNOSIS — I25110 Atherosclerotic heart disease of native coronary artery with unstable angina pectoris: Secondary | ICD-10-CM | POA: Diagnosis not present

## 2019-10-18 DIAGNOSIS — I130 Hypertensive heart and chronic kidney disease with heart failure and stage 1 through stage 4 chronic kidney disease, or unspecified chronic kidney disease: Secondary | ICD-10-CM | POA: Diagnosis not present

## 2019-10-18 DIAGNOSIS — N183 Chronic kidney disease, stage 3 unspecified: Secondary | ICD-10-CM | POA: Diagnosis not present

## 2019-10-18 DIAGNOSIS — I5023 Acute on chronic systolic (congestive) heart failure: Secondary | ICD-10-CM | POA: Diagnosis not present

## 2019-10-19 ENCOUNTER — Telehealth: Payer: Self-pay | Admitting: Family Medicine

## 2019-10-19 NOTE — Telephone Encounter (Signed)
I know you seen for hospital follow up for fall.  Did not see anything about a wound.  Are you ok with orders or do you want to see him?

## 2019-10-19 NOTE — Telephone Encounter (Signed)
That's fine. Ty.  

## 2019-10-19 NOTE — Telephone Encounter (Signed)
Amber calling with [facility] called and stated that she would like verbals for nursing for CHF and wound care   1x7

## 2019-10-20 NOTE — Telephone Encounter (Signed)
Called [facility] informed of PER PCP

## 2019-10-23 DIAGNOSIS — I130 Hypertensive heart and chronic kidney disease with heart failure and stage 1 through stage 4 chronic kidney disease, or unspecified chronic kidney disease: Secondary | ICD-10-CM | POA: Diagnosis not present

## 2019-10-23 DIAGNOSIS — I25110 Atherosclerotic heart disease of native coronary artery with unstable angina pectoris: Secondary | ICD-10-CM | POA: Diagnosis not present

## 2019-10-23 DIAGNOSIS — N183 Chronic kidney disease, stage 3 unspecified: Secondary | ICD-10-CM | POA: Diagnosis not present

## 2019-10-23 DIAGNOSIS — I252 Old myocardial infarction: Secondary | ICD-10-CM | POA: Diagnosis not present

## 2019-10-23 DIAGNOSIS — I5023 Acute on chronic systolic (congestive) heart failure: Secondary | ICD-10-CM | POA: Diagnosis not present

## 2019-10-23 DIAGNOSIS — E1122 Type 2 diabetes mellitus with diabetic chronic kidney disease: Secondary | ICD-10-CM | POA: Diagnosis not present

## 2019-10-25 ENCOUNTER — Telehealth: Payer: Self-pay

## 2019-10-25 NOTE — Telephone Encounter (Signed)
Called left detailed message of PCP ok per request

## 2019-10-25 NOTE — Telephone Encounter (Signed)
Copied from [place] [id number]. Topic: General - Other >> Oct 24, 2019  4:19 PM Rainey Pines A wrote: [place] with [facility] is requesting verbal orders for OT FOR 1W6. Best contact [phone]

## 2019-10-26 DIAGNOSIS — I252 Old myocardial infarction: Secondary | ICD-10-CM | POA: Diagnosis not present

## 2019-10-26 DIAGNOSIS — E1122 Type 2 diabetes mellitus with diabetic chronic kidney disease: Secondary | ICD-10-CM | POA: Diagnosis not present

## 2019-10-26 DIAGNOSIS — I25110 Atherosclerotic heart disease of native coronary artery with unstable angina pectoris: Secondary | ICD-10-CM | POA: Diagnosis not present

## 2019-10-26 DIAGNOSIS — I130 Hypertensive heart and chronic kidney disease with heart failure and stage 1 through stage 4 chronic kidney disease, or unspecified chronic kidney disease: Secondary | ICD-10-CM | POA: Diagnosis not present

## 2019-10-26 DIAGNOSIS — N183 Chronic kidney disease, stage 3 unspecified: Secondary | ICD-10-CM | POA: Diagnosis not present

## 2019-10-26 DIAGNOSIS — I5023 Acute on chronic systolic (congestive) heart failure: Secondary | ICD-10-CM | POA: Diagnosis not present

## 2019-10-27 ENCOUNTER — Telehealth: Payer: Self-pay | Admitting: Family Medicine

## 2019-10-27 DIAGNOSIS — I130 Hypertensive heart and chronic kidney disease with heart failure and stage 1 through stage 4 chronic kidney disease, or unspecified chronic kidney disease: Secondary | ICD-10-CM | POA: Diagnosis not present

## 2019-10-27 DIAGNOSIS — I5023 Acute on chronic systolic (congestive) heart failure: Secondary | ICD-10-CM | POA: Diagnosis not present

## 2019-10-27 DIAGNOSIS — I252 Old myocardial infarction: Secondary | ICD-10-CM | POA: Diagnosis not present

## 2019-10-27 DIAGNOSIS — N183 Chronic kidney disease, stage 3 unspecified: Secondary | ICD-10-CM | POA: Diagnosis not present

## 2019-10-27 DIAGNOSIS — I25110 Atherosclerotic heart disease of native coronary artery with unstable angina pectoris: Secondary | ICD-10-CM | POA: Diagnosis not present

## 2019-10-27 DIAGNOSIS — E1122 Type 2 diabetes mellitus with diabetic chronic kidney disease: Secondary | ICD-10-CM | POA: Diagnosis not present

## 2019-10-27 NOTE — Telephone Encounter (Signed)
Home Health Verbal Orders - Caller/AgencyLesleigh Noe [facility] [phone]) Requesting OT/PT/Skilled Nursing/Social Work/Speech Therapy: Nursing//[facility] teaching Frequency: 1x1wk, 2x4wk, 1x4wk, + 2 as needed

## 2019-10-27 NOTE — Telephone Encounter (Signed)
Called informed of verbal PCP ok

## 2019-10-31 DIAGNOSIS — I252 Old myocardial infarction: Secondary | ICD-10-CM | POA: Diagnosis not present

## 2019-10-31 DIAGNOSIS — I25110 Atherosclerotic heart disease of native coronary artery with unstable angina pectoris: Secondary | ICD-10-CM | POA: Diagnosis not present

## 2019-10-31 DIAGNOSIS — N183 Chronic kidney disease, stage 3 unspecified: Secondary | ICD-10-CM | POA: Diagnosis not present

## 2019-10-31 DIAGNOSIS — E1122 Type 2 diabetes mellitus with diabetic chronic kidney disease: Secondary | ICD-10-CM | POA: Diagnosis not present

## 2019-10-31 DIAGNOSIS — I5023 Acute on chronic systolic (congestive) heart failure: Secondary | ICD-10-CM | POA: Diagnosis not present

## 2019-10-31 DIAGNOSIS — I130 Hypertensive heart and chronic kidney disease with heart failure and stage 1 through stage 4 chronic kidney disease, or unspecified chronic kidney disease: Secondary | ICD-10-CM | POA: Diagnosis not present

## 2019-11-01 DIAGNOSIS — I25110 Atherosclerotic heart disease of native coronary artery with unstable angina pectoris: Secondary | ICD-10-CM | POA: Diagnosis not present

## 2019-11-01 DIAGNOSIS — I5023 Acute on chronic systolic (congestive) heart failure: Secondary | ICD-10-CM | POA: Diagnosis not present

## 2019-11-01 DIAGNOSIS — I252 Old myocardial infarction: Secondary | ICD-10-CM | POA: Diagnosis not present

## 2019-11-01 DIAGNOSIS — E1122 Type 2 diabetes mellitus with diabetic chronic kidney disease: Secondary | ICD-10-CM | POA: Diagnosis not present

## 2019-11-01 DIAGNOSIS — N183 Chronic kidney disease, stage 3 unspecified: Secondary | ICD-10-CM | POA: Diagnosis not present

## 2019-11-01 DIAGNOSIS — I130 Hypertensive heart and chronic kidney disease with heart failure and stage 1 through stage 4 chronic kidney disease, or unspecified chronic kidney disease: Secondary | ICD-10-CM | POA: Diagnosis not present

## 2019-11-02 DIAGNOSIS — N183 Chronic kidney disease, stage 3 unspecified: Secondary | ICD-10-CM | POA: Diagnosis not present

## 2019-11-02 DIAGNOSIS — G8929 Other chronic pain: Secondary | ICD-10-CM | POA: Diagnosis not present

## 2019-11-02 DIAGNOSIS — I2510 Atherosclerotic heart disease of native coronary artery without angina pectoris: Secondary | ICD-10-CM | POA: Diagnosis not present

## 2019-11-02 DIAGNOSIS — I130 Hypertensive heart and chronic kidney disease with heart failure and stage 1 through stage 4 chronic kidney disease, or unspecified chronic kidney disease: Secondary | ICD-10-CM | POA: Diagnosis not present

## 2019-11-02 DIAGNOSIS — L89312 Pressure ulcer of right buttock, stage 2: Secondary | ICD-10-CM | POA: Diagnosis not present

## 2019-11-02 DIAGNOSIS — E785 Hyperlipidemia, unspecified: Secondary | ICD-10-CM | POA: Diagnosis not present

## 2019-11-02 DIAGNOSIS — E876 Hypokalemia: Secondary | ICD-10-CM | POA: Diagnosis not present

## 2019-11-02 DIAGNOSIS — M1611 Unilateral primary osteoarthritis, right hip: Secondary | ICD-10-CM | POA: Diagnosis not present

## 2019-11-02 DIAGNOSIS — E1165 Type 2 diabetes mellitus with hyperglycemia: Secondary | ICD-10-CM | POA: Diagnosis not present

## 2019-11-02 DIAGNOSIS — E1122 Type 2 diabetes mellitus with diabetic chronic kidney disease: Secondary | ICD-10-CM | POA: Diagnosis not present

## 2019-11-02 DIAGNOSIS — L89152 Pressure ulcer of sacral region, stage 2: Secondary | ICD-10-CM | POA: Diagnosis not present

## 2019-11-02 DIAGNOSIS — L89322 Pressure ulcer of left buttock, stage 2: Secondary | ICD-10-CM | POA: Diagnosis not present

## 2019-11-02 DIAGNOSIS — M25562 Pain in left knee: Secondary | ICD-10-CM | POA: Diagnosis not present

## 2019-11-02 DIAGNOSIS — J441 Chronic obstructive pulmonary disease with (acute) exacerbation: Secondary | ICD-10-CM | POA: Diagnosis not present

## 2019-11-02 DIAGNOSIS — K219 Gastro-esophageal reflux disease without esophagitis: Secondary | ICD-10-CM | POA: Diagnosis not present

## 2019-11-02 DIAGNOSIS — D631 Anemia in chronic kidney disease: Secondary | ICD-10-CM | POA: Diagnosis not present

## 2019-11-02 DIAGNOSIS — Z9181 History of falling: Secondary | ICD-10-CM | POA: Diagnosis not present

## 2019-11-02 DIAGNOSIS — Z7984 Long term (current) use of oral hypoglycemic drugs: Secondary | ICD-10-CM | POA: Diagnosis not present

## 2019-11-02 DIAGNOSIS — M1711 Unilateral primary osteoarthritis, right knee: Secondary | ICD-10-CM | POA: Diagnosis not present

## 2019-11-02 DIAGNOSIS — I252 Old myocardial infarction: Secondary | ICD-10-CM | POA: Diagnosis not present

## 2019-11-02 DIAGNOSIS — F17210 Nicotine dependence, cigarettes, uncomplicated: Secondary | ICD-10-CM | POA: Diagnosis not present

## 2019-11-02 DIAGNOSIS — E1140 Type 2 diabetes mellitus with diabetic neuropathy, unspecified: Secondary | ICD-10-CM | POA: Diagnosis not present

## 2019-11-02 DIAGNOSIS — E7800 Pure hypercholesterolemia, unspecified: Secondary | ICD-10-CM | POA: Diagnosis not present

## 2019-11-02 DIAGNOSIS — M1030 Gout due to renal impairment, unspecified site: Secondary | ICD-10-CM | POA: Diagnosis not present

## 2019-11-02 DIAGNOSIS — I5023 Acute on chronic systolic (congestive) heart failure: Secondary | ICD-10-CM | POA: Diagnosis not present

## 2019-11-06 DIAGNOSIS — E1122 Type 2 diabetes mellitus with diabetic chronic kidney disease: Secondary | ICD-10-CM | POA: Diagnosis not present

## 2019-11-06 DIAGNOSIS — N183 Chronic kidney disease, stage 3 unspecified: Secondary | ICD-10-CM | POA: Diagnosis not present

## 2019-11-06 DIAGNOSIS — I2510 Atherosclerotic heart disease of native coronary artery without angina pectoris: Secondary | ICD-10-CM | POA: Diagnosis not present

## 2019-11-06 DIAGNOSIS — I130 Hypertensive heart and chronic kidney disease with heart failure and stage 1 through stage 4 chronic kidney disease, or unspecified chronic kidney disease: Secondary | ICD-10-CM | POA: Diagnosis not present

## 2019-11-06 DIAGNOSIS — I5023 Acute on chronic systolic (congestive) heart failure: Secondary | ICD-10-CM | POA: Diagnosis not present

## 2019-11-06 DIAGNOSIS — I252 Old myocardial infarction: Secondary | ICD-10-CM | POA: Diagnosis not present

## 2019-11-07 DIAGNOSIS — I130 Hypertensive heart and chronic kidney disease with heart failure and stage 1 through stage 4 chronic kidney disease, or unspecified chronic kidney disease: Secondary | ICD-10-CM | POA: Diagnosis not present

## 2019-11-07 DIAGNOSIS — N183 Chronic kidney disease, stage 3 unspecified: Secondary | ICD-10-CM | POA: Diagnosis not present

## 2019-11-07 DIAGNOSIS — I5023 Acute on chronic systolic (congestive) heart failure: Secondary | ICD-10-CM | POA: Diagnosis not present

## 2019-11-07 DIAGNOSIS — I2510 Atherosclerotic heart disease of native coronary artery without angina pectoris: Secondary | ICD-10-CM | POA: Diagnosis not present

## 2019-11-07 DIAGNOSIS — E1122 Type 2 diabetes mellitus with diabetic chronic kidney disease: Secondary | ICD-10-CM | POA: Diagnosis not present

## 2019-11-07 DIAGNOSIS — I252 Old myocardial infarction: Secondary | ICD-10-CM | POA: Diagnosis not present

## 2019-11-09 DIAGNOSIS — I5023 Acute on chronic systolic (congestive) heart failure: Secondary | ICD-10-CM | POA: Diagnosis not present

## 2019-11-09 DIAGNOSIS — I130 Hypertensive heart and chronic kidney disease with heart failure and stage 1 through stage 4 chronic kidney disease, or unspecified chronic kidney disease: Secondary | ICD-10-CM | POA: Diagnosis not present

## 2019-11-09 DIAGNOSIS — E1122 Type 2 diabetes mellitus with diabetic chronic kidney disease: Secondary | ICD-10-CM | POA: Diagnosis not present

## 2019-11-09 DIAGNOSIS — I252 Old myocardial infarction: Secondary | ICD-10-CM | POA: Diagnosis not present

## 2019-11-09 DIAGNOSIS — N183 Chronic kidney disease, stage 3 unspecified: Secondary | ICD-10-CM | POA: Diagnosis not present

## 2019-11-09 DIAGNOSIS — I2510 Atherosclerotic heart disease of native coronary artery without angina pectoris: Secondary | ICD-10-CM | POA: Diagnosis not present

## 2019-11-13 ENCOUNTER — Ambulatory Visit: Payer: Medicare Other | Admitting: Family Medicine

## 2019-11-13 DIAGNOSIS — I130 Hypertensive heart and chronic kidney disease with heart failure and stage 1 through stage 4 chronic kidney disease, or unspecified chronic kidney disease: Secondary | ICD-10-CM | POA: Diagnosis not present

## 2019-11-13 DIAGNOSIS — I2510 Atherosclerotic heart disease of native coronary artery without angina pectoris: Secondary | ICD-10-CM | POA: Diagnosis not present

## 2019-11-13 DIAGNOSIS — I5023 Acute on chronic systolic (congestive) heart failure: Secondary | ICD-10-CM | POA: Diagnosis not present

## 2019-11-13 DIAGNOSIS — I252 Old myocardial infarction: Secondary | ICD-10-CM | POA: Diagnosis not present

## 2019-11-13 DIAGNOSIS — E1122 Type 2 diabetes mellitus with diabetic chronic kidney disease: Secondary | ICD-10-CM | POA: Diagnosis not present

## 2019-11-13 DIAGNOSIS — N183 Chronic kidney disease, stage 3 unspecified: Secondary | ICD-10-CM | POA: Diagnosis not present

## 2019-11-14 DIAGNOSIS — N183 Chronic kidney disease, stage 3 unspecified: Secondary | ICD-10-CM | POA: Diagnosis not present

## 2019-11-14 DIAGNOSIS — I2510 Atherosclerotic heart disease of native coronary artery without angina pectoris: Secondary | ICD-10-CM | POA: Diagnosis not present

## 2019-11-14 DIAGNOSIS — I5023 Acute on chronic systolic (congestive) heart failure: Secondary | ICD-10-CM | POA: Diagnosis not present

## 2019-11-14 DIAGNOSIS — I130 Hypertensive heart and chronic kidney disease with heart failure and stage 1 through stage 4 chronic kidney disease, or unspecified chronic kidney disease: Secondary | ICD-10-CM | POA: Diagnosis not present

## 2019-11-14 DIAGNOSIS — I252 Old myocardial infarction: Secondary | ICD-10-CM | POA: Diagnosis not present

## 2019-11-14 DIAGNOSIS — E1122 Type 2 diabetes mellitus with diabetic chronic kidney disease: Secondary | ICD-10-CM | POA: Diagnosis not present

## 2019-11-20 DIAGNOSIS — I110 Hypertensive heart disease with heart failure: Secondary | ICD-10-CM | POA: Diagnosis not present

## 2019-11-20 DIAGNOSIS — E7849 Other hyperlipidemia: Secondary | ICD-10-CM | POA: Diagnosis not present

## 2019-11-20 DIAGNOSIS — I5082 Biventricular heart failure: Secondary | ICD-10-CM | POA: Diagnosis present

## 2019-11-20 DIAGNOSIS — E8770 Fluid overload, unspecified: Secondary | ICD-10-CM | POA: Diagnosis not present

## 2019-11-20 DIAGNOSIS — N179 Acute kidney failure, unspecified: Secondary | ICD-10-CM | POA: Diagnosis not present

## 2019-11-20 DIAGNOSIS — Z7982 Long term (current) use of aspirin: Secondary | ICD-10-CM | POA: Diagnosis not present

## 2019-11-20 DIAGNOSIS — E1142 Type 2 diabetes mellitus with diabetic polyneuropathy: Secondary | ICD-10-CM | POA: Diagnosis present

## 2019-11-20 DIAGNOSIS — J449 Chronic obstructive pulmonary disease, unspecified: Secondary | ICD-10-CM | POA: Diagnosis present

## 2019-11-20 DIAGNOSIS — I420 Dilated cardiomyopathy: Secondary | ICD-10-CM | POA: Diagnosis present

## 2019-11-20 DIAGNOSIS — Z7984 Long term (current) use of oral hypoglycemic drugs: Secondary | ICD-10-CM | POA: Diagnosis not present

## 2019-11-20 DIAGNOSIS — N183 Chronic kidney disease, stage 3 unspecified: Secondary | ICD-10-CM | POA: Diagnosis present

## 2019-11-20 DIAGNOSIS — I351 Nonrheumatic aortic (valve) insufficiency: Secondary | ICD-10-CM | POA: Diagnosis present

## 2019-11-20 DIAGNOSIS — E785 Hyperlipidemia, unspecified: Secondary | ICD-10-CM | POA: Diagnosis present

## 2019-11-20 DIAGNOSIS — R079 Chest pain, unspecified: Secondary | ICD-10-CM | POA: Diagnosis not present

## 2019-11-20 DIAGNOSIS — Z79899 Other long term (current) drug therapy: Secondary | ICD-10-CM | POA: Diagnosis not present

## 2019-11-20 DIAGNOSIS — F329 Major depressive disorder, single episode, unspecified: Secondary | ICD-10-CM | POA: Diagnosis present

## 2019-11-20 DIAGNOSIS — I472 Ventricular tachycardia: Secondary | ICD-10-CM | POA: Diagnosis not present

## 2019-11-20 DIAGNOSIS — Z515 Encounter for palliative care: Secondary | ICD-10-CM | POA: Diagnosis not present

## 2019-11-20 DIAGNOSIS — I5043 Acute on chronic combined systolic (congestive) and diastolic (congestive) heart failure: Secondary | ICD-10-CM | POA: Diagnosis present

## 2019-11-20 DIAGNOSIS — I130 Hypertensive heart and chronic kidney disease with heart failure and stage 1 through stage 4 chronic kidney disease, or unspecified chronic kidney disease: Secondary | ICD-10-CM | POA: Diagnosis present

## 2019-11-20 DIAGNOSIS — R7989 Other specified abnormal findings of blood chemistry: Secondary | ICD-10-CM | POA: Diagnosis not present

## 2019-11-20 DIAGNOSIS — I272 Pulmonary hypertension, unspecified: Secondary | ICD-10-CM | POA: Diagnosis not present

## 2019-11-20 DIAGNOSIS — M109 Gout, unspecified: Secondary | ICD-10-CM | POA: Diagnosis present

## 2019-11-20 DIAGNOSIS — Z951 Presence of aortocoronary bypass graft: Secondary | ICD-10-CM | POA: Diagnosis not present

## 2019-11-20 DIAGNOSIS — I2510 Atherosclerotic heart disease of native coronary artery without angina pectoris: Secondary | ICD-10-CM | POA: Diagnosis present

## 2019-11-20 DIAGNOSIS — R0602 Shortness of breath: Secondary | ICD-10-CM | POA: Diagnosis not present

## 2019-11-20 DIAGNOSIS — Z95810 Presence of automatic (implantable) cardiac defibrillator: Secondary | ICD-10-CM | POA: Diagnosis not present

## 2019-11-20 DIAGNOSIS — E1122 Type 2 diabetes mellitus with diabetic chronic kidney disease: Secondary | ICD-10-CM | POA: Diagnosis present

## 2019-11-20 DIAGNOSIS — E871 Hypo-osmolality and hyponatremia: Secondary | ICD-10-CM | POA: Diagnosis present

## 2019-11-20 DIAGNOSIS — D638 Anemia in other chronic diseases classified elsewhere: Secondary | ICD-10-CM | POA: Diagnosis present

## 2019-11-20 DIAGNOSIS — M25512 Pain in left shoulder: Secondary | ICD-10-CM | POA: Diagnosis not present

## 2019-11-20 DIAGNOSIS — K219 Gastro-esophageal reflux disease without esophagitis: Secondary | ICD-10-CM | POA: Diagnosis present

## 2019-11-20 DIAGNOSIS — N189 Chronic kidney disease, unspecified: Secondary | ICD-10-CM | POA: Diagnosis not present

## 2019-11-20 DIAGNOSIS — I255 Ischemic cardiomyopathy: Secondary | ICD-10-CM | POA: Diagnosis present

## 2019-11-20 DIAGNOSIS — R0789 Other chest pain: Secondary | ICD-10-CM | POA: Diagnosis not present

## 2019-11-20 DIAGNOSIS — I080 Rheumatic disorders of both mitral and aortic valves: Secondary | ICD-10-CM | POA: Diagnosis not present

## 2019-11-20 DIAGNOSIS — I5023 Acute on chronic systolic (congestive) heart failure: Secondary | ICD-10-CM | POA: Diagnosis not present

## 2019-11-27 DIAGNOSIS — I2510 Atherosclerotic heart disease of native coronary artery without angina pectoris: Secondary | ICD-10-CM | POA: Diagnosis not present

## 2019-11-27 DIAGNOSIS — I5023 Acute on chronic systolic (congestive) heart failure: Secondary | ICD-10-CM | POA: Diagnosis not present

## 2019-11-27 DIAGNOSIS — I252 Old myocardial infarction: Secondary | ICD-10-CM | POA: Diagnosis not present

## 2019-11-27 DIAGNOSIS — I130 Hypertensive heart and chronic kidney disease with heart failure and stage 1 through stage 4 chronic kidney disease, or unspecified chronic kidney disease: Secondary | ICD-10-CM | POA: Diagnosis not present

## 2019-11-27 DIAGNOSIS — E1122 Type 2 diabetes mellitus with diabetic chronic kidney disease: Secondary | ICD-10-CM | POA: Diagnosis not present

## 2019-11-27 DIAGNOSIS — N183 Chronic kidney disease, stage 3 unspecified: Secondary | ICD-10-CM | POA: Diagnosis not present

## 2019-11-29 ENCOUNTER — Telehealth: Payer: Self-pay

## 2019-11-29 NOTE — Telephone Encounter (Signed)
Called informed HHRN of PCP verbal ok °

## 2019-11-29 NOTE — Telephone Encounter (Signed)
Copied from [place] [id number]. Topic: General - Other >> Nov 29, 2019  2:33 PM Rainey Pines A wrote: Luetta Nutting with [place] is requesting verbal Orders for nursing 2w1 and 1w9. Best contact [phone]

## 2019-12-01 DIAGNOSIS — N183 Chronic kidney disease, stage 3 unspecified: Secondary | ICD-10-CM | POA: Diagnosis not present

## 2019-12-01 DIAGNOSIS — I2510 Atherosclerotic heart disease of native coronary artery without angina pectoris: Secondary | ICD-10-CM | POA: Diagnosis not present

## 2019-12-01 DIAGNOSIS — E1122 Type 2 diabetes mellitus with diabetic chronic kidney disease: Secondary | ICD-10-CM | POA: Diagnosis not present

## 2019-12-01 DIAGNOSIS — I5023 Acute on chronic systolic (congestive) heart failure: Secondary | ICD-10-CM | POA: Diagnosis not present

## 2019-12-01 DIAGNOSIS — I252 Old myocardial infarction: Secondary | ICD-10-CM | POA: Diagnosis not present

## 2019-12-01 DIAGNOSIS — I130 Hypertensive heart and chronic kidney disease with heart failure and stage 1 through stage 4 chronic kidney disease, or unspecified chronic kidney disease: Secondary | ICD-10-CM | POA: Diagnosis not present

## 2019-12-02 DIAGNOSIS — G8929 Other chronic pain: Secondary | ICD-10-CM | POA: Diagnosis not present

## 2019-12-02 DIAGNOSIS — M25562 Pain in left knee: Secondary | ICD-10-CM | POA: Diagnosis not present

## 2019-12-02 DIAGNOSIS — E1140 Type 2 diabetes mellitus with diabetic neuropathy, unspecified: Secondary | ICD-10-CM | POA: Diagnosis not present

## 2019-12-02 DIAGNOSIS — I130 Hypertensive heart and chronic kidney disease with heart failure and stage 1 through stage 4 chronic kidney disease, or unspecified chronic kidney disease: Secondary | ICD-10-CM | POA: Diagnosis not present

## 2019-12-02 DIAGNOSIS — I2510 Atherosclerotic heart disease of native coronary artery without angina pectoris: Secondary | ICD-10-CM | POA: Diagnosis not present

## 2019-12-02 DIAGNOSIS — E7800 Pure hypercholesterolemia, unspecified: Secondary | ICD-10-CM | POA: Diagnosis not present

## 2019-12-02 DIAGNOSIS — M1711 Unilateral primary osteoarthritis, right knee: Secondary | ICD-10-CM | POA: Diagnosis not present

## 2019-12-02 DIAGNOSIS — M10312 Gout due to renal impairment, left shoulder: Secondary | ICD-10-CM | POA: Diagnosis not present

## 2019-12-02 DIAGNOSIS — E1122 Type 2 diabetes mellitus with diabetic chronic kidney disease: Secondary | ICD-10-CM | POA: Diagnosis not present

## 2019-12-02 DIAGNOSIS — Z951 Presence of aortocoronary bypass graft: Secondary | ICD-10-CM | POA: Diagnosis not present

## 2019-12-02 DIAGNOSIS — N183 Chronic kidney disease, stage 3 unspecified: Secondary | ICD-10-CM | POA: Diagnosis not present

## 2019-12-02 DIAGNOSIS — M1611 Unilateral primary osteoarthritis, right hip: Secondary | ICD-10-CM | POA: Diagnosis not present

## 2019-12-02 DIAGNOSIS — J449 Chronic obstructive pulmonary disease, unspecified: Secondary | ICD-10-CM | POA: Diagnosis not present

## 2019-12-02 DIAGNOSIS — Z7984 Long term (current) use of oral hypoglycemic drugs: Secondary | ICD-10-CM | POA: Diagnosis not present

## 2019-12-02 DIAGNOSIS — D631 Anemia in chronic kidney disease: Secondary | ICD-10-CM | POA: Diagnosis not present

## 2019-12-02 DIAGNOSIS — K219 Gastro-esophageal reflux disease without esophagitis: Secondary | ICD-10-CM | POA: Diagnosis not present

## 2019-12-02 DIAGNOSIS — I5023 Acute on chronic systolic (congestive) heart failure: Secondary | ICD-10-CM | POA: Diagnosis not present

## 2019-12-02 DIAGNOSIS — E876 Hypokalemia: Secondary | ICD-10-CM | POA: Diagnosis not present

## 2019-12-02 DIAGNOSIS — E785 Hyperlipidemia, unspecified: Secondary | ICD-10-CM | POA: Diagnosis not present

## 2019-12-02 DIAGNOSIS — Z9181 History of falling: Secondary | ICD-10-CM | POA: Diagnosis not present

## 2019-12-02 DIAGNOSIS — F17210 Nicotine dependence, cigarettes, uncomplicated: Secondary | ICD-10-CM | POA: Diagnosis not present

## 2019-12-02 DIAGNOSIS — I252 Old myocardial infarction: Secondary | ICD-10-CM | POA: Diagnosis not present

## 2019-12-02 DIAGNOSIS — Z95810 Presence of automatic (implantable) cardiac defibrillator: Secondary | ICD-10-CM | POA: Diagnosis not present

## 2019-12-04 DIAGNOSIS — I2510 Atherosclerotic heart disease of native coronary artery without angina pectoris: Secondary | ICD-10-CM | POA: Diagnosis not present

## 2019-12-04 DIAGNOSIS — I130 Hypertensive heart and chronic kidney disease with heart failure and stage 1 through stage 4 chronic kidney disease, or unspecified chronic kidney disease: Secondary | ICD-10-CM | POA: Diagnosis not present

## 2019-12-04 DIAGNOSIS — N183 Chronic kidney disease, stage 3 unspecified: Secondary | ICD-10-CM | POA: Diagnosis not present

## 2019-12-04 DIAGNOSIS — I5023 Acute on chronic systolic (congestive) heart failure: Secondary | ICD-10-CM | POA: Diagnosis not present

## 2019-12-04 DIAGNOSIS — I252 Old myocardial infarction: Secondary | ICD-10-CM | POA: Diagnosis not present

## 2019-12-04 DIAGNOSIS — E1122 Type 2 diabetes mellitus with diabetic chronic kidney disease: Secondary | ICD-10-CM | POA: Diagnosis not present

## 2019-12-07 ENCOUNTER — Telehealth: Payer: Self-pay | Admitting: Family Medicine

## 2019-12-07 DIAGNOSIS — N183 Chronic kidney disease, stage 3 unspecified: Secondary | ICD-10-CM | POA: Diagnosis not present

## 2019-12-07 DIAGNOSIS — E1122 Type 2 diabetes mellitus with diabetic chronic kidney disease: Secondary | ICD-10-CM | POA: Diagnosis not present

## 2019-12-07 DIAGNOSIS — I2510 Atherosclerotic heart disease of native coronary artery without angina pectoris: Secondary | ICD-10-CM | POA: Diagnosis not present

## 2019-12-07 DIAGNOSIS — I5023 Acute on chronic systolic (congestive) heart failure: Secondary | ICD-10-CM | POA: Diagnosis not present

## 2019-12-07 DIAGNOSIS — I130 Hypertensive heart and chronic kidney disease with heart failure and stage 1 through stage 4 chronic kidney disease, or unspecified chronic kidney disease: Secondary | ICD-10-CM | POA: Diagnosis not present

## 2019-12-07 DIAGNOSIS — I252 Old myocardial infarction: Secondary | ICD-10-CM | POA: Diagnosis not present

## 2019-12-07 NOTE — Telephone Encounter (Signed)
Amy RN from home health services requesting Glucometer meter and test trips and digoxin (LANOXIN) 0.125 MG tablet , informed please allow 48 to 72 hour turn around time   [id number] [place] [place] - [address] AT [address]OSS FROM [address]

## 2019-12-13 ENCOUNTER — Telehealth: Payer: Self-pay | Admitting: Family Medicine

## 2019-12-13 NOTE — Telephone Encounter (Signed)
Called [facility] left detailed message ok per PCP for request

## 2019-12-13 NOTE — Telephone Encounter (Signed)
Chris with [facility] is calling in for PT orders to work with pt.   Frequency: 2 week 1 and 1 week every other week for 7 weeks.    CB: [phone]

## 2019-12-14 ENCOUNTER — Telehealth: Payer: Self-pay

## 2019-12-14 DIAGNOSIS — I2510 Atherosclerotic heart disease of native coronary artery without angina pectoris: Secondary | ICD-10-CM | POA: Diagnosis not present

## 2019-12-14 DIAGNOSIS — I130 Hypertensive heart and chronic kidney disease with heart failure and stage 1 through stage 4 chronic kidney disease, or unspecified chronic kidney disease: Secondary | ICD-10-CM | POA: Diagnosis not present

## 2019-12-14 DIAGNOSIS — I252 Old myocardial infarction: Secondary | ICD-10-CM | POA: Diagnosis not present

## 2019-12-14 DIAGNOSIS — N183 Chronic kidney disease, stage 3 unspecified: Secondary | ICD-10-CM | POA: Diagnosis not present

## 2019-12-14 DIAGNOSIS — I5023 Acute on chronic systolic (congestive) heart failure: Secondary | ICD-10-CM | POA: Diagnosis not present

## 2019-12-14 DIAGNOSIS — E1122 Type 2 diabetes mellitus with diabetic chronic kidney disease: Secondary | ICD-10-CM | POA: Diagnosis not present

## 2019-12-14 NOTE — Telephone Encounter (Signed)
That's fine. Let's schedule him an appt before the holiday. Ty.

## 2019-12-14 NOTE — Telephone Encounter (Signed)
NW-Karen  from [facility] called to inform Dr.Wendling that patients measurements have gone up significantly since last week. So she is concerned with his fluid intake. Patients stomach is up 4 centimeters from last week and his ankles are up 2 centimeters. Patient came home from hospital and has some sores that have opened up and Santiago Glad  would like an order for some border dressings.  Best contact [phone] advise/thx dmf

## 2019-12-15 NOTE — Telephone Encounter (Signed)
Called [facility] and did ok request. Also did schedule while on the phone a hospital followup on Monday 12/18/2019 at 4. She will call me back to cancel/reschedule if he cannot make it at that time

## 2019-12-16 DIAGNOSIS — I130 Hypertensive heart and chronic kidney disease with heart failure and stage 1 through stage 4 chronic kidney disease, or unspecified chronic kidney disease: Secondary | ICD-10-CM | POA: Diagnosis not present

## 2019-12-16 DIAGNOSIS — I2510 Atherosclerotic heart disease of native coronary artery without angina pectoris: Secondary | ICD-10-CM | POA: Diagnosis not present

## 2019-12-16 DIAGNOSIS — I5023 Acute on chronic systolic (congestive) heart failure: Secondary | ICD-10-CM | POA: Diagnosis not present

## 2019-12-16 DIAGNOSIS — I252 Old myocardial infarction: Secondary | ICD-10-CM | POA: Diagnosis not present

## 2019-12-16 DIAGNOSIS — N183 Chronic kidney disease, stage 3 unspecified: Secondary | ICD-10-CM | POA: Diagnosis not present

## 2019-12-16 DIAGNOSIS — E1122 Type 2 diabetes mellitus with diabetic chronic kidney disease: Secondary | ICD-10-CM | POA: Diagnosis not present

## 2019-12-18 ENCOUNTER — Ambulatory Visit: Payer: Medicare Other | Admitting: Family Medicine

## 2019-12-19 DIAGNOSIS — E1122 Type 2 diabetes mellitus with diabetic chronic kidney disease: Secondary | ICD-10-CM | POA: Diagnosis not present

## 2019-12-19 DIAGNOSIS — I5023 Acute on chronic systolic (congestive) heart failure: Secondary | ICD-10-CM | POA: Diagnosis not present

## 2019-12-19 DIAGNOSIS — I2510 Atherosclerotic heart disease of native coronary artery without angina pectoris: Secondary | ICD-10-CM | POA: Diagnosis not present

## 2019-12-19 DIAGNOSIS — N183 Chronic kidney disease, stage 3 unspecified: Secondary | ICD-10-CM | POA: Diagnosis not present

## 2019-12-19 DIAGNOSIS — I130 Hypertensive heart and chronic kidney disease with heart failure and stage 1 through stage 4 chronic kidney disease, or unspecified chronic kidney disease: Secondary | ICD-10-CM | POA: Diagnosis not present

## 2019-12-19 DIAGNOSIS — I252 Old myocardial infarction: Secondary | ICD-10-CM | POA: Diagnosis not present

## 2019-12-21 DIAGNOSIS — I2510 Atherosclerotic heart disease of native coronary artery without angina pectoris: Secondary | ICD-10-CM | POA: Diagnosis not present

## 2019-12-21 DIAGNOSIS — I5023 Acute on chronic systolic (congestive) heart failure: Secondary | ICD-10-CM | POA: Diagnosis not present

## 2019-12-21 DIAGNOSIS — N183 Chronic kidney disease, stage 3 unspecified: Secondary | ICD-10-CM | POA: Diagnosis not present

## 2019-12-21 DIAGNOSIS — E1122 Type 2 diabetes mellitus with diabetic chronic kidney disease: Secondary | ICD-10-CM | POA: Diagnosis not present

## 2019-12-21 DIAGNOSIS — I252 Old myocardial infarction: Secondary | ICD-10-CM | POA: Diagnosis not present

## 2019-12-21 DIAGNOSIS — I130 Hypertensive heart and chronic kidney disease with heart failure and stage 1 through stage 4 chronic kidney disease, or unspecified chronic kidney disease: Secondary | ICD-10-CM | POA: Diagnosis not present

## 2019-12-24 DIAGNOSIS — Z950 Presence of cardiac pacemaker: Secondary | ICD-10-CM | POA: Diagnosis not present

## 2019-12-26 ENCOUNTER — Ambulatory Visit: Payer: Medicare Other | Admitting: Family Medicine

## 2019-12-29 DIAGNOSIS — I130 Hypertensive heart and chronic kidney disease with heart failure and stage 1 through stage 4 chronic kidney disease, or unspecified chronic kidney disease: Secondary | ICD-10-CM | POA: Diagnosis not present

## 2019-12-29 DIAGNOSIS — I2510 Atherosclerotic heart disease of native coronary artery without angina pectoris: Secondary | ICD-10-CM | POA: Diagnosis not present

## 2019-12-29 DIAGNOSIS — E1122 Type 2 diabetes mellitus with diabetic chronic kidney disease: Secondary | ICD-10-CM | POA: Diagnosis not present

## 2019-12-29 DIAGNOSIS — N183 Chronic kidney disease, stage 3 unspecified: Secondary | ICD-10-CM | POA: Diagnosis not present

## 2019-12-29 DIAGNOSIS — I252 Old myocardial infarction: Secondary | ICD-10-CM | POA: Diagnosis not present

## 2019-12-29 DIAGNOSIS — I5023 Acute on chronic systolic (congestive) heart failure: Secondary | ICD-10-CM | POA: Diagnosis not present

## 2020-01-01 DIAGNOSIS — M10312 Gout due to renal impairment, left shoulder: Secondary | ICD-10-CM | POA: Diagnosis not present

## 2020-01-01 DIAGNOSIS — L89312 Pressure ulcer of right buttock, stage 2: Secondary | ICD-10-CM | POA: Diagnosis not present

## 2020-01-01 DIAGNOSIS — M1711 Unilateral primary osteoarthritis, right knee: Secondary | ICD-10-CM | POA: Diagnosis not present

## 2020-01-01 DIAGNOSIS — I252 Old myocardial infarction: Secondary | ICD-10-CM | POA: Diagnosis not present

## 2020-01-01 DIAGNOSIS — Z9181 History of falling: Secondary | ICD-10-CM | POA: Diagnosis not present

## 2020-01-01 DIAGNOSIS — E785 Hyperlipidemia, unspecified: Secondary | ICD-10-CM | POA: Diagnosis not present

## 2020-01-01 DIAGNOSIS — J1289 Other viral pneumonia: Secondary | ICD-10-CM | POA: Diagnosis not present

## 2020-01-01 DIAGNOSIS — K219 Gastro-esophageal reflux disease without esophagitis: Secondary | ICD-10-CM | POA: Diagnosis not present

## 2020-01-01 DIAGNOSIS — D631 Anemia in chronic kidney disease: Secondary | ICD-10-CM | POA: Diagnosis not present

## 2020-01-01 DIAGNOSIS — I5023 Acute on chronic systolic (congestive) heart failure: Secondary | ICD-10-CM | POA: Diagnosis not present

## 2020-01-01 DIAGNOSIS — L89322 Pressure ulcer of left buttock, stage 2: Secondary | ICD-10-CM | POA: Diagnosis not present

## 2020-01-01 DIAGNOSIS — N183 Chronic kidney disease, stage 3 unspecified: Secondary | ICD-10-CM | POA: Diagnosis not present

## 2020-01-01 DIAGNOSIS — F17210 Nicotine dependence, cigarettes, uncomplicated: Secondary | ICD-10-CM | POA: Diagnosis not present

## 2020-01-01 DIAGNOSIS — I130 Hypertensive heart and chronic kidney disease with heart failure and stage 1 through stage 4 chronic kidney disease, or unspecified chronic kidney disease: Secondary | ICD-10-CM | POA: Diagnosis not present

## 2020-01-01 DIAGNOSIS — I2510 Atherosclerotic heart disease of native coronary artery without angina pectoris: Secondary | ICD-10-CM | POA: Diagnosis not present

## 2020-01-01 DIAGNOSIS — Z7984 Long term (current) use of oral hypoglycemic drugs: Secondary | ICD-10-CM | POA: Diagnosis not present

## 2020-01-01 DIAGNOSIS — U071 COVID-19: Secondary | ICD-10-CM | POA: Diagnosis not present

## 2020-01-01 DIAGNOSIS — M25562 Pain in left knee: Secondary | ICD-10-CM | POA: Diagnosis not present

## 2020-01-01 DIAGNOSIS — E7800 Pure hypercholesterolemia, unspecified: Secondary | ICD-10-CM | POA: Diagnosis not present

## 2020-01-01 DIAGNOSIS — J449 Chronic obstructive pulmonary disease, unspecified: Secondary | ICD-10-CM | POA: Diagnosis not present

## 2020-01-01 DIAGNOSIS — E1140 Type 2 diabetes mellitus with diabetic neuropathy, unspecified: Secondary | ICD-10-CM | POA: Diagnosis not present

## 2020-01-01 DIAGNOSIS — G8929 Other chronic pain: Secondary | ICD-10-CM | POA: Diagnosis not present

## 2020-01-01 DIAGNOSIS — Z7901 Long term (current) use of anticoagulants: Secondary | ICD-10-CM | POA: Diagnosis not present

## 2020-01-01 DIAGNOSIS — M1611 Unilateral primary osteoarthritis, right hip: Secondary | ICD-10-CM | POA: Diagnosis not present

## 2020-01-01 DIAGNOSIS — E1122 Type 2 diabetes mellitus with diabetic chronic kidney disease: Secondary | ICD-10-CM | POA: Diagnosis not present

## 2020-01-02 DIAGNOSIS — U071 COVID-19: Secondary | ICD-10-CM | POA: Diagnosis not present

## 2020-01-02 DIAGNOSIS — L89312 Pressure ulcer of right buttock, stage 2: Secondary | ICD-10-CM | POA: Diagnosis not present

## 2020-01-02 DIAGNOSIS — I252 Old myocardial infarction: Secondary | ICD-10-CM | POA: Diagnosis not present

## 2020-01-02 DIAGNOSIS — I2510 Atherosclerotic heart disease of native coronary artery without angina pectoris: Secondary | ICD-10-CM | POA: Diagnosis not present

## 2020-01-02 DIAGNOSIS — L89322 Pressure ulcer of left buttock, stage 2: Secondary | ICD-10-CM | POA: Diagnosis not present

## 2020-01-02 DIAGNOSIS — J1289 Other viral pneumonia: Secondary | ICD-10-CM | POA: Diagnosis not present

## 2020-01-04 DIAGNOSIS — J1289 Other viral pneumonia: Secondary | ICD-10-CM | POA: Diagnosis not present

## 2020-01-04 DIAGNOSIS — L89322 Pressure ulcer of left buttock, stage 2: Secondary | ICD-10-CM | POA: Diagnosis not present

## 2020-01-04 DIAGNOSIS — I252 Old myocardial infarction: Secondary | ICD-10-CM | POA: Diagnosis not present

## 2020-01-04 DIAGNOSIS — I2510 Atherosclerotic heart disease of native coronary artery without angina pectoris: Secondary | ICD-10-CM | POA: Diagnosis not present

## 2020-01-04 DIAGNOSIS — U071 COVID-19: Secondary | ICD-10-CM | POA: Diagnosis not present

## 2020-01-04 DIAGNOSIS — L89312 Pressure ulcer of right buttock, stage 2: Secondary | ICD-10-CM | POA: Diagnosis not present

## 2020-01-09 ENCOUNTER — Telehealth: Payer: Self-pay | Admitting: Family Medicine

## 2020-01-09 DIAGNOSIS — I252 Old myocardial infarction: Secondary | ICD-10-CM | POA: Diagnosis not present

## 2020-01-09 DIAGNOSIS — I2510 Atherosclerotic heart disease of native coronary artery without angina pectoris: Secondary | ICD-10-CM | POA: Diagnosis not present

## 2020-01-09 DIAGNOSIS — J1289 Other viral pneumonia: Secondary | ICD-10-CM | POA: Diagnosis not present

## 2020-01-09 DIAGNOSIS — U071 COVID-19: Secondary | ICD-10-CM | POA: Diagnosis not present

## 2020-01-09 DIAGNOSIS — L89322 Pressure ulcer of left buttock, stage 2: Secondary | ICD-10-CM | POA: Diagnosis not present

## 2020-01-09 DIAGNOSIS — L89312 Pressure ulcer of right buttock, stage 2: Secondary | ICD-10-CM | POA: Diagnosis not present

## 2020-01-09 NOTE — Telephone Encounter (Signed)
Lavella Hammock RN with [facility],   PT had visit with pt today and weighed pt, pt has had a  20 LB weight gain since hospital on 12/29/19    Pt has some edema but no SOB    CB: [phone]

## 2020-01-11 ENCOUNTER — Telehealth: Payer: Self-pay | Admitting: Family Medicine

## 2020-01-11 ENCOUNTER — Ambulatory Visit: Payer: Self-pay

## 2020-01-11 DIAGNOSIS — M7989 Other specified soft tissue disorders: Secondary | ICD-10-CM | POA: Diagnosis not present

## 2020-01-11 DIAGNOSIS — I130 Hypertensive heart and chronic kidney disease with heart failure and stage 1 through stage 4 chronic kidney disease, or unspecified chronic kidney disease: Secondary | ICD-10-CM | POA: Diagnosis not present

## 2020-01-11 DIAGNOSIS — I110 Hypertensive heart disease with heart failure: Secondary | ICD-10-CM | POA: Diagnosis not present

## 2020-01-11 DIAGNOSIS — R7989 Other specified abnormal findings of blood chemistry: Secondary | ICD-10-CM | POA: Diagnosis not present

## 2020-01-11 DIAGNOSIS — R140 Abdominal distension (gaseous): Secondary | ICD-10-CM | POA: Diagnosis not present

## 2020-01-11 DIAGNOSIS — I509 Heart failure, unspecified: Secondary | ICD-10-CM | POA: Diagnosis not present

## 2020-01-11 DIAGNOSIS — I2510 Atherosclerotic heart disease of native coronary artery without angina pectoris: Secondary | ICD-10-CM | POA: Diagnosis not present

## 2020-01-11 DIAGNOSIS — U071 COVID-19: Secondary | ICD-10-CM | POA: Diagnosis not present

## 2020-01-11 DIAGNOSIS — R5381 Other malaise: Secondary | ICD-10-CM | POA: Diagnosis not present

## 2020-01-11 DIAGNOSIS — I248 Other forms of acute ischemic heart disease: Secondary | ICD-10-CM | POA: Diagnosis not present

## 2020-01-11 DIAGNOSIS — I255 Ischemic cardiomyopathy: Secondary | ICD-10-CM | POA: Diagnosis not present

## 2020-01-11 DIAGNOSIS — Z951 Presence of aortocoronary bypass graft: Secondary | ICD-10-CM | POA: Diagnosis not present

## 2020-01-11 DIAGNOSIS — R0602 Shortness of breath: Secondary | ICD-10-CM | POA: Diagnosis not present

## 2020-01-11 DIAGNOSIS — N289 Disorder of kidney and ureter, unspecified: Secondary | ICD-10-CM | POA: Diagnosis not present

## 2020-01-11 DIAGNOSIS — I5043 Acute on chronic combined systolic (congestive) and diastolic (congestive) heart failure: Secondary | ICD-10-CM | POA: Diagnosis not present

## 2020-01-11 DIAGNOSIS — J1289 Other viral pneumonia: Secondary | ICD-10-CM | POA: Diagnosis not present

## 2020-01-11 DIAGNOSIS — E119 Type 2 diabetes mellitus without complications: Secondary | ICD-10-CM | POA: Diagnosis not present

## 2020-01-11 DIAGNOSIS — L89312 Pressure ulcer of right buttock, stage 2: Secondary | ICD-10-CM | POA: Diagnosis not present

## 2020-01-11 DIAGNOSIS — I252 Old myocardial infarction: Secondary | ICD-10-CM | POA: Diagnosis not present

## 2020-01-11 DIAGNOSIS — R778 Other specified abnormalities of plasma proteins: Secondary | ICD-10-CM | POA: Diagnosis not present

## 2020-01-11 DIAGNOSIS — L89322 Pressure ulcer of left buttock, stage 2: Secondary | ICD-10-CM | POA: Diagnosis not present

## 2020-01-11 NOTE — Telephone Encounter (Signed)
Copied from [place] [phone]. Topic: General - Other >> Jan 11, 2020 11:33 AM Leward Quan A wrote: Reason for CRM: Harrell Gave with [place] called to check on status of orders that were faxed over to be completed and signed by Dr Nani Ravens. He is asking if these can be taken care of and returned please ASAP and that if there are any questions or concerns please call Ph# [phone]

## 2020-01-11 NOTE — Telephone Encounter (Signed)
rec'd call from [place].  She is with pt. at this time.  Stated she reported Reported pt. Has worsening shortness of breath with laying flat and with minimal activity.  Reported pt. Has increase in 20 # over approx. Past 12 days.  Upon her assess., left lung has diminished breath sounds upper and lower lobe, abdomen is swollen and tight, and lower extremities have +2 to +3 edema to above the knees, bilat.  Pt. Has a loose cough.  Denied chest pain or tightness.   Advised [facility] nurse that pt. Needs evaluated in the ER.  Pt. In agreement.  She will call EMS.  Will route note to PCP to make him aware.    [facility] Nurse requested an order for a glucometer for home use.  Advised will make PCP aware, however, if pt. Is admitted, this may need to be ordered by the hospital, upon discharge.   Reason for Disposition . [1] MODERATE difficulty breathing (e.g., speaks in phrases, SOB even at rest, pulse 100-120) AND [2] NEW-onset or WORSE than normal    Per [facility] nurse, very short of breath with laying flat or with minimal activity; left upper and lower lobes sound diminished; very faint wheeze; weight gain of 20 # over approx. 12 days.; abdomen swollen and tight, LE's very edematous up to above knees.  Answer Assessment - Initial Assessment Questions 1. RESPIRATORY STATUS: "Describe your breathing?" (e.g., wheezing, shortness of breath, unable to speak, severe coughing)      Shortness of breath at rest  2. ONSET: "When did this breathing problem begin?"      *No Answer* 3. PATTERN "Does the difficult breathing come and go, or has it been constant since it started?"      *No Answer* 4. SEVERITY: "How bad is your breathing?" (e.g., mild, moderate, severe)    - MILD: No SOB at rest, mild SOB with walking, speaks normally in sentences, can lay down, no retractions, pulse < 100.    - MODERATE: SOB at rest, SOB with minimal exertion and prefers to sit, cannot lie down flat, speaks in phrases, mild retractions, audible  wheezing, pulse 100-120.    - SEVERE: Very SOB at rest, speaks in single words, struggling to breathe, sitting hunched forward, retractions, pulse > 120      Intermittent SOB; very SOB with laying and with activity becomes severe 5. RECURRENT SYMPTOM: "Have you had difficulty breathing before?" If so, ask: "When was the last time?" and "What happened that time?"      *No Answer* 6. CARDIAC HISTORY: "Do you have any history of heart disease?" (e.g., heart attack, angina, bypass surgery, angioplasty)      CHF  7. LUNG HISTORY: "Do you have any history of lung disease?"  (e.g., pulmonary embolus, asthma, emphysema)      8. CAUSE: "What do you think is causing the breathing problem?"      *No Answer* 9. OTHER SYMPTOMS: "Do you have any other symptoms? (e.g., dizziness, runny nose, cough, chest pain, fever)     Cough is loose 2 times; increased shortness of breath; O2 97 %; BP 122/82, P 62, reg. , afebrile ; lower extremity edema to above the knee (+2-+3) 10. PREGNANCY: "Is there any chance you are pregnant?" "When was your last menstrual period?"       N/a  11. TRAVEL: "Have you traveled out of the country in the last month?" (e.g., travel history, exposures)       *No Answer*  Protocols used: BREATHING DIFFICULTY-A-AH

## 2020-01-12 DIAGNOSIS — D72819 Decreased white blood cell count, unspecified: Secondary | ICD-10-CM | POA: Diagnosis present

## 2020-01-12 DIAGNOSIS — Z951 Presence of aortocoronary bypass graft: Secondary | ICD-10-CM | POA: Diagnosis not present

## 2020-01-12 DIAGNOSIS — I5043 Acute on chronic combined systolic (congestive) and diastolic (congestive) heart failure: Secondary | ICD-10-CM | POA: Diagnosis present

## 2020-01-12 DIAGNOSIS — I248 Other forms of acute ischemic heart disease: Secondary | ICD-10-CM | POA: Diagnosis present

## 2020-01-12 DIAGNOSIS — E1122 Type 2 diabetes mellitus with diabetic chronic kidney disease: Secondary | ICD-10-CM | POA: Diagnosis present

## 2020-01-12 DIAGNOSIS — I5023 Acute on chronic systolic (congestive) heart failure: Secondary | ICD-10-CM | POA: Diagnosis not present

## 2020-01-12 DIAGNOSIS — I351 Nonrheumatic aortic (valve) insufficiency: Secondary | ICD-10-CM | POA: Diagnosis present

## 2020-01-12 DIAGNOSIS — J449 Chronic obstructive pulmonary disease, unspecified: Secondary | ICD-10-CM | POA: Diagnosis present

## 2020-01-12 DIAGNOSIS — M1990 Unspecified osteoarthritis, unspecified site: Secondary | ICD-10-CM | POA: Diagnosis present

## 2020-01-12 DIAGNOSIS — N289 Disorder of kidney and ureter, unspecified: Secondary | ICD-10-CM | POA: Diagnosis present

## 2020-01-12 DIAGNOSIS — I255 Ischemic cardiomyopathy: Secondary | ICD-10-CM | POA: Diagnosis present

## 2020-01-12 DIAGNOSIS — I2510 Atherosclerotic heart disease of native coronary artery without angina pectoris: Secondary | ICD-10-CM | POA: Diagnosis present

## 2020-01-12 DIAGNOSIS — E1140 Type 2 diabetes mellitus with diabetic neuropathy, unspecified: Secondary | ICD-10-CM | POA: Diagnosis present

## 2020-01-12 DIAGNOSIS — Z95810 Presence of automatic (implantable) cardiac defibrillator: Secondary | ICD-10-CM | POA: Diagnosis not present

## 2020-01-12 DIAGNOSIS — K219 Gastro-esophageal reflux disease without esophagitis: Secondary | ICD-10-CM | POA: Diagnosis present

## 2020-01-12 DIAGNOSIS — E785 Hyperlipidemia, unspecified: Secondary | ICD-10-CM | POA: Diagnosis present

## 2020-01-12 DIAGNOSIS — I130 Hypertensive heart and chronic kidney disease with heart failure and stage 1 through stage 4 chronic kidney disease, or unspecified chronic kidney disease: Secondary | ICD-10-CM | POA: Diagnosis present

## 2020-01-12 DIAGNOSIS — E639 Nutritional deficiency, unspecified: Secondary | ICD-10-CM | POA: Diagnosis present

## 2020-01-12 DIAGNOSIS — F329 Major depressive disorder, single episode, unspecified: Secondary | ICD-10-CM | POA: Diagnosis present

## 2020-01-12 DIAGNOSIS — D649 Anemia, unspecified: Secondary | ICD-10-CM | POA: Diagnosis present

## 2020-01-12 DIAGNOSIS — M109 Gout, unspecified: Secondary | ICD-10-CM | POA: Diagnosis present

## 2020-01-12 DIAGNOSIS — N183 Chronic kidney disease, stage 3 unspecified: Secondary | ICD-10-CM | POA: Diagnosis present

## 2020-01-12 NOTE — Telephone Encounter (Signed)
Paperwork has now been faxed

## 2020-01-12 NOTE — Telephone Encounter (Signed)
Called to inform we have no paper work currently///they will refax

## 2020-01-20 DIAGNOSIS — L89322 Pressure ulcer of left buttock, stage 2: Secondary | ICD-10-CM | POA: Diagnosis not present

## 2020-01-20 DIAGNOSIS — I252 Old myocardial infarction: Secondary | ICD-10-CM | POA: Diagnosis not present

## 2020-01-20 DIAGNOSIS — J1289 Other viral pneumonia: Secondary | ICD-10-CM | POA: Diagnosis not present

## 2020-01-20 DIAGNOSIS — L89312 Pressure ulcer of right buttock, stage 2: Secondary | ICD-10-CM | POA: Diagnosis not present

## 2020-01-20 DIAGNOSIS — U071 COVID-19: Secondary | ICD-10-CM | POA: Diagnosis not present

## 2020-01-20 DIAGNOSIS — I2510 Atherosclerotic heart disease of native coronary artery without angina pectoris: Secondary | ICD-10-CM | POA: Diagnosis not present

## 2020-01-24 DIAGNOSIS — L89322 Pressure ulcer of left buttock, stage 2: Secondary | ICD-10-CM | POA: Diagnosis not present

## 2020-01-24 DIAGNOSIS — J1289 Other viral pneumonia: Secondary | ICD-10-CM | POA: Diagnosis not present

## 2020-01-24 DIAGNOSIS — I252 Old myocardial infarction: Secondary | ICD-10-CM | POA: Diagnosis not present

## 2020-01-24 DIAGNOSIS — U071 COVID-19: Secondary | ICD-10-CM | POA: Diagnosis not present

## 2020-01-24 DIAGNOSIS — I2510 Atherosclerotic heart disease of native coronary artery without angina pectoris: Secondary | ICD-10-CM | POA: Diagnosis not present

## 2020-01-24 DIAGNOSIS — L89312 Pressure ulcer of right buttock, stage 2: Secondary | ICD-10-CM | POA: Diagnosis not present

## 2020-01-29 ENCOUNTER — Telehealth: Payer: Self-pay | Admitting: Neurology

## 2020-01-29 DIAGNOSIS — I252 Old myocardial infarction: Secondary | ICD-10-CM | POA: Diagnosis not present

## 2020-01-29 DIAGNOSIS — J1289 Other viral pneumonia: Secondary | ICD-10-CM | POA: Diagnosis not present

## 2020-01-29 DIAGNOSIS — U071 COVID-19: Secondary | ICD-10-CM | POA: Diagnosis not present

## 2020-01-29 DIAGNOSIS — I2510 Atherosclerotic heart disease of native coronary artery without angina pectoris: Secondary | ICD-10-CM | POA: Diagnosis not present

## 2020-01-29 DIAGNOSIS — L89322 Pressure ulcer of left buttock, stage 2: Secondary | ICD-10-CM | POA: Diagnosis not present

## 2020-01-29 DIAGNOSIS — L89312 Pressure ulcer of right buttock, stage 2: Secondary | ICD-10-CM | POA: Diagnosis not present

## 2020-01-29 NOTE — Telephone Encounter (Signed)
Left on confidential voicemail

## 2020-01-29 NOTE — Telephone Encounter (Signed)
I am not seeing a referral placed by this office for the patient and I only see one visit for the patient. Please advise

## 2020-01-29 NOTE — Telephone Encounter (Signed)
Agree, it looks like his PCP ordered home health in September.  Recommend they reach out to patient's PCP for home health orders.

## 2020-01-29 NOTE — Telephone Encounter (Signed)
Patrick Stewart (PT)  with [facility] is needing Verbal Orders for 1 x a week for 5 weeks to re certify In [place]. Please Call. Thank you

## 2020-01-31 DIAGNOSIS — M25562 Pain in left knee: Secondary | ICD-10-CM | POA: Diagnosis not present

## 2020-01-31 DIAGNOSIS — L89322 Pressure ulcer of left buttock, stage 2: Secondary | ICD-10-CM | POA: Diagnosis not present

## 2020-01-31 DIAGNOSIS — E1122 Type 2 diabetes mellitus with diabetic chronic kidney disease: Secondary | ICD-10-CM | POA: Diagnosis not present

## 2020-01-31 DIAGNOSIS — Z8616 Personal history of COVID-19: Secondary | ICD-10-CM | POA: Diagnosis not present

## 2020-01-31 DIAGNOSIS — G8929 Other chronic pain: Secondary | ICD-10-CM | POA: Diagnosis not present

## 2020-01-31 DIAGNOSIS — M1711 Unilateral primary osteoarthritis, right knee: Secondary | ICD-10-CM | POA: Diagnosis not present

## 2020-01-31 DIAGNOSIS — I5023 Acute on chronic systolic (congestive) heart failure: Secondary | ICD-10-CM | POA: Diagnosis not present

## 2020-01-31 DIAGNOSIS — K219 Gastro-esophageal reflux disease without esophagitis: Secondary | ICD-10-CM | POA: Diagnosis not present

## 2020-01-31 DIAGNOSIS — U071 COVID-19: Secondary | ICD-10-CM | POA: Diagnosis not present

## 2020-01-31 DIAGNOSIS — L89312 Pressure ulcer of right buttock, stage 2: Secondary | ICD-10-CM | POA: Diagnosis not present

## 2020-01-31 DIAGNOSIS — Z7901 Long term (current) use of anticoagulants: Secondary | ICD-10-CM | POA: Diagnosis not present

## 2020-01-31 DIAGNOSIS — M10312 Gout due to renal impairment, left shoulder: Secondary | ICD-10-CM | POA: Diagnosis not present

## 2020-01-31 DIAGNOSIS — N183 Chronic kidney disease, stage 3 unspecified: Secondary | ICD-10-CM | POA: Diagnosis not present

## 2020-01-31 DIAGNOSIS — E785 Hyperlipidemia, unspecified: Secondary | ICD-10-CM | POA: Diagnosis not present

## 2020-01-31 DIAGNOSIS — D631 Anemia in chronic kidney disease: Secondary | ICD-10-CM | POA: Diagnosis not present

## 2020-01-31 DIAGNOSIS — F17210 Nicotine dependence, cigarettes, uncomplicated: Secondary | ICD-10-CM | POA: Diagnosis not present

## 2020-01-31 DIAGNOSIS — I252 Old myocardial infarction: Secondary | ICD-10-CM | POA: Diagnosis not present

## 2020-01-31 DIAGNOSIS — E1140 Type 2 diabetes mellitus with diabetic neuropathy, unspecified: Secondary | ICD-10-CM | POA: Diagnosis not present

## 2020-01-31 DIAGNOSIS — J1289 Other viral pneumonia: Secondary | ICD-10-CM | POA: Diagnosis not present

## 2020-01-31 DIAGNOSIS — I130 Hypertensive heart and chronic kidney disease with heart failure and stage 1 through stage 4 chronic kidney disease, or unspecified chronic kidney disease: Secondary | ICD-10-CM | POA: Diagnosis not present

## 2020-01-31 DIAGNOSIS — Z7984 Long term (current) use of oral hypoglycemic drugs: Secondary | ICD-10-CM | POA: Diagnosis not present

## 2020-01-31 DIAGNOSIS — E7800 Pure hypercholesterolemia, unspecified: Secondary | ICD-10-CM | POA: Diagnosis not present

## 2020-01-31 DIAGNOSIS — M1611 Unilateral primary osteoarthritis, right hip: Secondary | ICD-10-CM | POA: Diagnosis not present

## 2020-01-31 DIAGNOSIS — I2510 Atherosclerotic heart disease of native coronary artery without angina pectoris: Secondary | ICD-10-CM | POA: Diagnosis not present

## 2020-01-31 DIAGNOSIS — Z9181 History of falling: Secondary | ICD-10-CM | POA: Diagnosis not present

## 2020-01-31 DIAGNOSIS — J449 Chronic obstructive pulmonary disease, unspecified: Secondary | ICD-10-CM | POA: Diagnosis not present

## 2020-02-14 DIAGNOSIS — I5023 Acute on chronic systolic (congestive) heart failure: Secondary | ICD-10-CM | POA: Diagnosis not present

## 2020-02-14 DIAGNOSIS — N183 Chronic kidney disease, stage 3 unspecified: Secondary | ICD-10-CM | POA: Diagnosis not present

## 2020-02-14 DIAGNOSIS — I2510 Atherosclerotic heart disease of native coronary artery without angina pectoris: Secondary | ICD-10-CM | POA: Diagnosis not present

## 2020-02-14 DIAGNOSIS — E1122 Type 2 diabetes mellitus with diabetic chronic kidney disease: Secondary | ICD-10-CM | POA: Diagnosis not present

## 2020-02-14 DIAGNOSIS — I252 Old myocardial infarction: Secondary | ICD-10-CM | POA: Diagnosis not present

## 2020-02-14 DIAGNOSIS — I130 Hypertensive heart and chronic kidney disease with heart failure and stage 1 through stage 4 chronic kidney disease, or unspecified chronic kidney disease: Secondary | ICD-10-CM | POA: Diagnosis not present

## 2020-02-23 ENCOUNTER — Telehealth: Payer: Self-pay | Admitting: *Deleted

## 2020-02-23 ENCOUNTER — Telehealth: Payer: Self-pay | Admitting: Family Medicine

## 2020-02-23 DIAGNOSIS — N183 Chronic kidney disease, stage 3 unspecified: Secondary | ICD-10-CM | POA: Diagnosis not present

## 2020-02-23 DIAGNOSIS — I2510 Atherosclerotic heart disease of native coronary artery without angina pectoris: Secondary | ICD-10-CM | POA: Diagnosis not present

## 2020-02-23 DIAGNOSIS — I130 Hypertensive heart and chronic kidney disease with heart failure and stage 1 through stage 4 chronic kidney disease, or unspecified chronic kidney disease: Secondary | ICD-10-CM | POA: Diagnosis not present

## 2020-02-23 DIAGNOSIS — I5023 Acute on chronic systolic (congestive) heart failure: Secondary | ICD-10-CM | POA: Diagnosis not present

## 2020-02-23 DIAGNOSIS — E1122 Type 2 diabetes mellitus with diabetic chronic kidney disease: Secondary | ICD-10-CM | POA: Diagnosis not present

## 2020-02-23 DIAGNOSIS — I252 Old myocardial infarction: Secondary | ICD-10-CM | POA: Diagnosis not present

## 2020-02-23 NOTE — Telephone Encounter (Signed)
Schedule him an appointment next week. I've been trying to get him to follow up for weeks and he either no shows or doesn't respond. Let me know if he does not respond. Ty.

## 2020-02-23 NOTE — Telephone Encounter (Signed)
Jerrilynne from PT called and stated that patient has a weight increase from 170 (2/17) to 180 (2/26).  She could tell the difference when he walked thru the door.  She stated that he has been taking his torsemide regularly 2 tabs daily and no changes in foods.  Please advise.

## 2020-02-23 NOTE — Telephone Encounter (Signed)
Left message with Hassan Rowan [phone] to call back to get him scheduled.   Jerrilynne with PT number is [phone].  Jerrilynne with PT stated that they had a problem with getting him scheduled until the started speaking with Hassan Rowan to get him scheduled.

## 2020-02-26 NOTE — Telephone Encounter (Signed)
Appointment scheduled for Friday. 

## 2020-02-27 DIAGNOSIS — Z951 Presence of aortocoronary bypass graft: Secondary | ICD-10-CM | POA: Diagnosis not present

## 2020-02-27 DIAGNOSIS — I083 Combined rheumatic disorders of mitral, aortic and tricuspid valves: Secondary | ICD-10-CM | POA: Diagnosis not present

## 2020-02-27 DIAGNOSIS — I5021 Acute systolic (congestive) heart failure: Secondary | ICD-10-CM | POA: Diagnosis not present

## 2020-02-27 DIAGNOSIS — I161 Hypertensive emergency: Secondary | ICD-10-CM | POA: Diagnosis present

## 2020-02-27 DIAGNOSIS — M1990 Unspecified osteoarthritis, unspecified site: Secondary | ICD-10-CM | POA: Diagnosis present

## 2020-02-27 DIAGNOSIS — I5020 Unspecified systolic (congestive) heart failure: Secondary | ICD-10-CM | POA: Diagnosis not present

## 2020-02-27 DIAGNOSIS — M109 Gout, unspecified: Secondary | ICD-10-CM | POA: Diagnosis present

## 2020-02-27 DIAGNOSIS — E8770 Fluid overload, unspecified: Secondary | ICD-10-CM | POA: Diagnosis not present

## 2020-02-27 DIAGNOSIS — J449 Chronic obstructive pulmonary disease, unspecified: Secondary | ICD-10-CM | POA: Diagnosis present

## 2020-02-27 DIAGNOSIS — Z7984 Long term (current) use of oral hypoglycemic drugs: Secondary | ICD-10-CM | POA: Diagnosis not present

## 2020-02-27 DIAGNOSIS — I509 Heart failure, unspecified: Secondary | ICD-10-CM | POA: Diagnosis not present

## 2020-02-27 DIAGNOSIS — D72819 Decreased white blood cell count, unspecified: Secondary | ICD-10-CM | POA: Diagnosis present

## 2020-02-27 DIAGNOSIS — I517 Cardiomegaly: Secondary | ICD-10-CM | POA: Diagnosis not present

## 2020-02-27 DIAGNOSIS — Z95810 Presence of automatic (implantable) cardiac defibrillator: Secondary | ICD-10-CM | POA: Diagnosis not present

## 2020-02-27 DIAGNOSIS — M7989 Other specified soft tissue disorders: Secondary | ICD-10-CM | POA: Diagnosis not present

## 2020-02-27 DIAGNOSIS — I371 Nonrheumatic pulmonary valve insufficiency: Secondary | ICD-10-CM | POA: Diagnosis not present

## 2020-02-27 DIAGNOSIS — R609 Edema, unspecified: Secondary | ICD-10-CM | POA: Diagnosis not present

## 2020-02-27 DIAGNOSIS — E785 Hyperlipidemia, unspecified: Secondary | ICD-10-CM | POA: Diagnosis present

## 2020-02-27 DIAGNOSIS — I5023 Acute on chronic systolic (congestive) heart failure: Secondary | ICD-10-CM | POA: Diagnosis present

## 2020-02-27 DIAGNOSIS — E559 Vitamin D deficiency, unspecified: Secondary | ICD-10-CM | POA: Diagnosis present

## 2020-02-27 DIAGNOSIS — I272 Pulmonary hypertension, unspecified: Secondary | ICD-10-CM | POA: Diagnosis present

## 2020-02-27 DIAGNOSIS — Z79899 Other long term (current) drug therapy: Secondary | ICD-10-CM | POA: Diagnosis not present

## 2020-02-27 DIAGNOSIS — Z87891 Personal history of nicotine dependence: Secondary | ICD-10-CM | POA: Diagnosis not present

## 2020-02-27 DIAGNOSIS — N189 Chronic kidney disease, unspecified: Secondary | ICD-10-CM | POA: Diagnosis not present

## 2020-02-27 DIAGNOSIS — R0602 Shortness of breath: Secondary | ICD-10-CM | POA: Diagnosis not present

## 2020-02-27 DIAGNOSIS — E8779 Other fluid overload: Secondary | ICD-10-CM | POA: Diagnosis not present

## 2020-02-27 DIAGNOSIS — E1122 Type 2 diabetes mellitus with diabetic chronic kidney disease: Secondary | ICD-10-CM | POA: Diagnosis not present

## 2020-02-27 DIAGNOSIS — N179 Acute kidney failure, unspecified: Secondary | ICD-10-CM | POA: Diagnosis present

## 2020-02-27 DIAGNOSIS — I2510 Atherosclerotic heart disease of native coronary artery without angina pectoris: Secondary | ICD-10-CM | POA: Diagnosis present

## 2020-02-27 DIAGNOSIS — F329 Major depressive disorder, single episode, unspecified: Secondary | ICD-10-CM | POA: Diagnosis present

## 2020-02-27 DIAGNOSIS — I130 Hypertensive heart and chronic kidney disease with heart failure and stage 1 through stage 4 chronic kidney disease, or unspecified chronic kidney disease: Secondary | ICD-10-CM | POA: Diagnosis present

## 2020-02-27 DIAGNOSIS — R140 Abdominal distension (gaseous): Secondary | ICD-10-CM | POA: Diagnosis not present

## 2020-02-27 DIAGNOSIS — I255 Ischemic cardiomyopathy: Secondary | ICD-10-CM | POA: Diagnosis present

## 2020-02-27 DIAGNOSIS — K219 Gastro-esophageal reflux disease without esophagitis: Secondary | ICD-10-CM | POA: Diagnosis present

## 2020-02-27 DIAGNOSIS — I878 Other specified disorders of veins: Secondary | ICD-10-CM | POA: Diagnosis not present

## 2020-02-27 DIAGNOSIS — N183 Chronic kidney disease, stage 3 unspecified: Secondary | ICD-10-CM | POA: Diagnosis present

## 2020-03-01 ENCOUNTER — Encounter: Payer: Self-pay | Admitting: Family Medicine

## 2020-03-01 ENCOUNTER — Ambulatory Visit (INDEPENDENT_AMBULATORY_CARE_PROVIDER_SITE_OTHER): Payer: Medicare Other | Admitting: Family Medicine

## 2020-03-01 ENCOUNTER — Other Ambulatory Visit: Payer: Self-pay

## 2020-03-01 VITALS — BP 130/78 | HR 77 | Temp 96.1°F | Ht 70.0 in | Wt 180.0 lb

## 2020-03-01 DIAGNOSIS — Z7901 Long term (current) use of anticoagulants: Secondary | ICD-10-CM | POA: Diagnosis not present

## 2020-03-01 DIAGNOSIS — E7800 Pure hypercholesterolemia, unspecified: Secondary | ICD-10-CM | POA: Diagnosis not present

## 2020-03-01 DIAGNOSIS — E1142 Type 2 diabetes mellitus with diabetic polyneuropathy: Secondary | ICD-10-CM | POA: Diagnosis not present

## 2020-03-01 DIAGNOSIS — E1140 Type 2 diabetes mellitus with diabetic neuropathy, unspecified: Secondary | ICD-10-CM | POA: Diagnosis not present

## 2020-03-01 DIAGNOSIS — Z8616 Personal history of COVID-19: Secondary | ICD-10-CM | POA: Diagnosis not present

## 2020-03-01 DIAGNOSIS — E1122 Type 2 diabetes mellitus with diabetic chronic kidney disease: Secondary | ICD-10-CM | POA: Diagnosis not present

## 2020-03-01 DIAGNOSIS — I252 Old myocardial infarction: Secondary | ICD-10-CM | POA: Diagnosis not present

## 2020-03-01 DIAGNOSIS — E785 Hyperlipidemia, unspecified: Secondary | ICD-10-CM | POA: Diagnosis not present

## 2020-03-01 DIAGNOSIS — G8929 Other chronic pain: Secondary | ICD-10-CM | POA: Diagnosis not present

## 2020-03-01 DIAGNOSIS — Z7984 Long term (current) use of oral hypoglycemic drugs: Secondary | ICD-10-CM | POA: Diagnosis not present

## 2020-03-01 DIAGNOSIS — Z9181 History of falling: Secondary | ICD-10-CM | POA: Diagnosis not present

## 2020-03-01 DIAGNOSIS — I5023 Acute on chronic systolic (congestive) heart failure: Secondary | ICD-10-CM | POA: Diagnosis not present

## 2020-03-01 DIAGNOSIS — M1711 Unilateral primary osteoarthritis, right knee: Secondary | ICD-10-CM | POA: Diagnosis not present

## 2020-03-01 DIAGNOSIS — M1611 Unilateral primary osteoarthritis, right hip: Secondary | ICD-10-CM | POA: Diagnosis not present

## 2020-03-01 DIAGNOSIS — K219 Gastro-esophageal reflux disease without esophagitis: Secondary | ICD-10-CM | POA: Diagnosis not present

## 2020-03-01 DIAGNOSIS — I5022 Chronic systolic (congestive) heart failure: Secondary | ICD-10-CM

## 2020-03-01 DIAGNOSIS — M25562 Pain in left knee: Secondary | ICD-10-CM | POA: Diagnosis not present

## 2020-03-01 DIAGNOSIS — N183 Chronic kidney disease, stage 3 unspecified: Secondary | ICD-10-CM | POA: Diagnosis not present

## 2020-03-01 DIAGNOSIS — F329 Major depressive disorder, single episode, unspecified: Secondary | ICD-10-CM | POA: Diagnosis not present

## 2020-03-01 DIAGNOSIS — Z87891 Personal history of nicotine dependence: Secondary | ICD-10-CM | POA: Diagnosis not present

## 2020-03-01 DIAGNOSIS — D631 Anemia in chronic kidney disease: Secondary | ICD-10-CM | POA: Diagnosis not present

## 2020-03-01 DIAGNOSIS — I130 Hypertensive heart and chronic kidney disease with heart failure and stage 1 through stage 4 chronic kidney disease, or unspecified chronic kidney disease: Secondary | ICD-10-CM | POA: Diagnosis not present

## 2020-03-01 DIAGNOSIS — J449 Chronic obstructive pulmonary disease, unspecified: Secondary | ICD-10-CM | POA: Diagnosis not present

## 2020-03-01 DIAGNOSIS — I2510 Atherosclerotic heart disease of native coronary artery without angina pectoris: Secondary | ICD-10-CM | POA: Diagnosis not present

## 2020-03-01 LAB — CBC
HCT: 32 % — ABNORMAL LOW (ref 39.0–52.0)
Hemoglobin: 10.4 g/dL — ABNORMAL LOW (ref 13.0–17.0)
MCHC: 32.4 g/dL (ref 30.0–36.0)
MCV: 96.5 fl (ref 78.0–100.0)
Platelets: 197 10*3/uL (ref 150.0–400.0)
RBC: 3.32 Mil/uL — ABNORMAL LOW (ref 4.22–5.81)
RDW: 18.7 % — ABNORMAL HIGH (ref 11.5–15.5)
WBC: 3.3 10*3/uL — ABNORMAL LOW (ref 4.0–10.5)

## 2020-03-01 LAB — COMPREHENSIVE METABOLIC PANEL
ALT: 6 U/L (ref 0–53)
AST: 17 U/L (ref 0–37)
Albumin: 3.1 g/dL — ABNORMAL LOW (ref 3.5–5.2)
Alkaline Phosphatase: 79 U/L (ref 39–117)
BUN: 50 mg/dL — ABNORMAL HIGH (ref 6–23)
CO2: 33 mEq/L — ABNORMAL HIGH (ref 19–32)
Calcium: 9.2 mg/dL (ref 8.4–10.5)
Chloride: 95 mEq/L — ABNORMAL LOW (ref 96–112)
Creatinine, Ser: 2.14 mg/dL — ABNORMAL HIGH (ref 0.40–1.50)
GFR: 37.78 mL/min — ABNORMAL LOW (ref >60.00)
Glucose, Bld: 119 mg/dL — ABNORMAL HIGH (ref 70–99)
Potassium: 4 mEq/L (ref 3.5–5.1)
Sodium: 135 mEq/L (ref 135–145)
Total Bilirubin: 1.3 mg/dL — ABNORMAL HIGH (ref 0.2–1.2)
Total Protein: 8.3 g/dL (ref 6.0–8.3)

## 2020-03-01 LAB — MICROALBUMIN / CREATININE URINE RATIO
Creatinine,U: 105.4 mg/dL
Microalb Creat Ratio: 47.5 mg/g — ABNORMAL HIGH (ref 0.0–30.0)
Microalb, Ur: 50.1 mg/dL — ABNORMAL HIGH (ref 0.0–1.9)

## 2020-03-01 LAB — HEMOGLOBIN A1C: Hgb A1c MFr Bld: 6.1 % (ref 4.6–6.5)

## 2020-03-01 MED ORDER — BUMETANIDE 1 MG PO TABS
2.00 | ORAL_TABLET | ORAL | Status: DC
Start: 2020-02-29 — End: 2020-03-01

## 2020-03-01 MED ORDER — ALBUTEROL SULFATE (5 MG/ML) 0.5% IN NEBU
2.50 | INHALATION_SOLUTION | RESPIRATORY_TRACT | Status: DC
Start: ? — End: 2020-03-01

## 2020-03-01 MED ORDER — SENNOSIDES-DOCUSATE SODIUM 8.6-50 MG PO TABS
1.00 | ORAL_TABLET | ORAL | Status: DC
Start: ? — End: 2020-03-01

## 2020-03-01 MED ORDER — INSULIN LISPRO 100 UNIT/ML ~~LOC~~ SOLN
2.00 | SUBCUTANEOUS | Status: DC
Start: 2020-02-29 — End: 2020-03-01

## 2020-03-01 MED ORDER — CHOLECALCIFEROL 25 MCG (1000 UT) PO TABS
2000.00 | ORAL_TABLET | ORAL | Status: DC
Start: 2020-03-01 — End: 2020-03-01

## 2020-03-01 MED ORDER — DEXTROSE 10 % IV SOLN
125.00 | INTRAVENOUS | Status: DC
Start: ? — End: 2020-03-01

## 2020-03-01 MED ORDER — FERROUS SULFATE 325 (65 FE) MG PO TABS
325.00 | ORAL_TABLET | ORAL | Status: DC
Start: 2020-02-29 — End: 2020-03-01

## 2020-03-01 MED ORDER — ONDANSETRON HCL 4 MG/2ML IJ SOLN
4.00 | INTRAMUSCULAR | Status: DC
Start: ? — End: 2020-03-01

## 2020-03-01 MED ORDER — HYDRALAZINE HCL 20 MG/ML IJ SOLN
10.00 | INTRAMUSCULAR | Status: DC
Start: ? — End: 2020-03-01

## 2020-03-01 MED ORDER — GLUCAGON (RDNA) 1 MG IJ KIT
1.00 | PACK | INTRAMUSCULAR | Status: DC
Start: ? — End: 2020-03-01

## 2020-03-01 MED ORDER — CARVEDILOL 12.5 MG PO TABS
12.50 | ORAL_TABLET | ORAL | Status: DC
Start: 2020-02-29 — End: 2020-03-01

## 2020-03-01 MED ORDER — POTASSIUM CHLORIDE CRYS ER 20 MEQ PO TBCR
20.00 | EXTENDED_RELEASE_TABLET | ORAL | Status: DC
Start: 2020-03-01 — End: 2020-03-01

## 2020-03-01 MED ORDER — BUMETANIDE 2 MG PO TABS: 2.0000 mg | ORAL_TABLET | Freq: Two times a day (BID) | ORAL | 5 refills | Status: DC

## 2020-03-01 MED ORDER — DIGOXIN 125 MCG PO TABS
125.00 | ORAL_TABLET | ORAL | Status: DC
Start: 2020-03-01 — End: 2020-03-01

## 2020-03-01 MED ORDER — MAGNESIUM OXIDE 400 MG PO TABS
400.00 | ORAL_TABLET | ORAL | Status: DC
Start: 2020-02-29 — End: 2020-03-01

## 2020-03-01 MED ORDER — NORTRIPTYLINE HCL 10 MG PO CAPS
10.00 | ORAL_CAPSULE | ORAL | Status: DC
Start: 2020-03-01 — End: 2020-03-01

## 2020-03-01 MED ORDER — HEPARIN SODIUM (PORCINE) 5000 UNIT/ML IJ SOLN
5000.00 | INTRAMUSCULAR | Status: DC
Start: 2020-02-29 — End: 2020-03-01

## 2020-03-01 MED ORDER — ACETAMINOPHEN 325 MG PO TABS
325.00 | ORAL_TABLET | ORAL | Status: DC
Start: ? — End: 2020-03-01

## 2020-03-01 MED ORDER — GLUCOSE 40 % PO GEL
15.00 | ORAL | Status: DC
Start: ? — End: 2020-03-01

## 2020-03-01 MED ORDER — DULOXETINE HCL 20 MG PO CPEP
20.00 | ORAL_CAPSULE | ORAL | Status: DC
Start: 2020-03-01 — End: 2020-03-01

## 2020-03-01 MED ORDER — GABAPENTIN 300 MG PO CAPS
600.00 | ORAL_CAPSULE | ORAL | Status: DC
Start: 2020-02-29 — End: 2020-03-01

## 2020-03-01 MED ORDER — ASPIRIN 81 MG PO CHEW
81.00 | CHEWABLE_TABLET | ORAL | Status: DC
Start: 2020-03-01 — End: 2020-03-01

## 2020-03-01 NOTE — Progress Notes (Signed)
Chief Complaint  Patient presents with  . Hospitalization Follow-up    HPI Patrick Stewart is a 64 y.o. y.o. male who presents for a transition of care visit.  Pt was discharged from [facility] on [facility]/29/20.  The patient is presenting within 48 hours of discharge.  He is here with his wife.  The patient has a history of heart failure with reduced ejection fraction of around 20%.  He reports compliance with his medications.  Earlier in the week, he had around 20 pounds of weight gain in addition to shortness of breath and increased lower extremity swelling.  While inpatient, he was switched from torsemide to Bumex.  He has not picked up this medicine yet.  The swelling in his legs is improved and he is breathing normally.  He has a scant cough but is improved as well.  No fevers, chest pain, urinary complaints, or diarrhea. He does have f/u appts w cardiology and nephrology.     Past Medical History:  Diagnosis Date  . CAD (coronary artery disease) 2010   3v CABG  . CHF (congestive heart failure) ([place]) 01/26/2012  . CKD (chronic kidney disease) 02/19/2012  . COPD with chronic bronchitis ([place]) 08/26/2016  . Diabetes mellitus type 2, uncontrolled ([place]) 01/26/2012  . Elevated liver function tests 01/26/2012  . Gout 01/26/2012  . Hx of CABG 2010   RCA Stent 2008, CABG x 2 [facility] 2010  . Hypercholesterolemia 07/08/2016  . Hypertension 09/04/2014  . Ischemic cardiomyopathy 11/15/2014  . Kidney stone 10/25/2015  . Lipid disorder 01/26/2012  . Noncompliance 10/18/2018  . NSTEMI (non-ST elevated myocardial infarction) ([place]) 07/08/2016  . ST elevation myocardial infarction (STEMI) ([place]) 2010  . Unstable angina ([place]) 11/14/2014   Past Surgical History:  Procedure Laterality Date  . BIV ICD INSERTION CRT-D N/A 10/21/2017   Procedure: BIV ICD INSERTION CRT-D;  Surgeon: Constance Haw, MD;  Location: [place] [facility];  Service: Cardiovascular;  Laterality: N/A;  . CORONARY ARTERY BYPASS GRAFT   2010  . KNEE SURGERY    . RIGHT/LEFT HEART CATH AND CORONARY ANGIOGRAPHY N/A 04/15/2017   Procedure: Right/Left Heart Cath and Coronary Angiography;  Surgeon: Larey Dresser, MD;  Location: [facility] [facility];  Service: Cardiovascular;  Laterality: N/A;  . ROTATOR CUFF REPAIR    . WRIST SURGERY     Family History  Problem Relation Age of Onset  . Hypertension Mother   . Diabetes Mother   . Hyperlipidemia Mother   . Kidney failure Mother   . Emphysema Father    Allergies as of 03/01/2020      Reactions   Amitriptyline Other (See Comments)   drowsy   Hydrocodone Itching   Tramadol Nausea Only      Medication List       Accurate as of March 01, 2020  1:46 PM. If you have any questions, ask your nurse or doctor.        STOP taking these medications   oxyCODONE 5 MG immediate release tablet Commonly known as: Oxy IR/ROXICODONE Stopped by: Shelda Pal, DO   torsemide 20 MG tablet Commonly known as: DEMADEX Stopped by: Shelda Pal, DO     TAKE these medications   albuterol 108 (90 Base) MCG/ACT inhaler Commonly known as: VENTOLIN HFA Inhale 2 puffs into the lungs every 6 (six) hours as needed for wheezing or shortness of breath. What changed: Another medication with the same name was changed. Make sure you understand how and  when to take each.   albuterol (2.5 MG/3ML) 0.083% nebulizer solution Commonly known as: PROVENTIL INHALE CONTENTS OF 1 VIAL VIA NEBULIZER EVERY 6 HOURS AS NEEDED FOR WHEEZING/SHORTNESS OF BREATH What changed:   how much to take  how to take this  when to take this  reasons to take this  additional instructions   allopurinol 300 MG tablet Commonly known as: ZYLOPRIM Take 1 tablet (300 mg total) by mouth 2 (two) times daily.   aspirin 81 MG EC tablet Take 1 tablet (81 mg total) by mouth daily.   atorvastatin 80 MG tablet Commonly known as: LIPITOR Take 1 tablet (80 mg total) by mouth daily.   bumetanide 2 MG  tablet Commonly known as: BUMEX Take 1 tablet (2 mg total) by mouth 2 (two) times daily. Started by: Shelda Pal, DO   carvedilol 12.5 MG tablet Commonly known as: COREG Take 1 tablet (12.5 mg total) by mouth 2 (two) times daily with a meal.   Colchicine 0.6 MG Caps Take 0.6 mg by mouth 2 (two) times daily as needed (gout).   digoxin 0.125 MG tablet Commonly known as: LANOXIN Take 1 tablet (125 mcg total) by mouth daily.   DULoxetine 20 MG capsule Commonly known as: CYMBALTA TAKE 1 CAPSULE BY MOUTH EVERY DAY What changed: how much to take   Entresto 49-51 MG Generic drug: sacubitril-valsartan Take 1 tablet by mouth 2 (two) times daily.   freestyle lancets Use to check sugars daily   gabapentin 600 MG tablet Commonly known as: NEURONTIN Take 1 tablet (600 mg total) by mouth 2 (two) times daily.   Gait/Transfer [employer] Use as needed   glipiZIDE 5 MG tablet Commonly known as: GLUCOTROL Take 1 tablet (5 mg total) by mouth daily before breakfast.   glucose blood test strip Use as instructed   meloxicam 15 MG tablet Commonly known as: MOBIC TAKE 1 TABLET BY MOUTH EVERY DAY   nortriptyline 10 MG capsule Commonly known as: PAMELOR TAKE 1 CAPSULE (10 MG TOTAL) BY MOUTH AT BEDTIME.       ROS:  Constitutional: No fevers or chills, no weight loss HEENT: No headaches, hearing loss, or runny nose, no sore throat Heart: No chest pain Lungs: No SOB, no cough Abd: No bowel changes, no pain, no N/V GU: No urinary complaints Neuro: No numbness, tingling or weakness Msk: No joint or muscle pain  Objective BP 130/78 (BP Location: Left Arm, Patient Position: Sitting, Cuff Size: Normal)   Pulse 77   Temp (!) 96.1 F (35.6 C) (Temporal)   Ht 5\' 10"  (1.778 m)   Wt 180 lb (81.6 kg)   SpO2 93%   BMI 25.83 kg/m  General Appearance:  awake, alert, oriented, in no acute distress and well developed, well nourished Skin:  there are no suspicious lesions or  rashes of concern Head/face:  NCAT Eyes:  EOMI, PERRLA Ears:  canals and TMs NI Nose/Sinuses:  negative Mouth/Throat:  Mucosa moist, no lesions; pharynx without erythema, edema or exudate. Neck:  neck- supple, no mass, non-tender and no jvd Lungs: Clear to auscultation.  No rales, rhonchi, or wheezing. Normal effort, no accessory muscle use. Heart:  Heart sounds are normal.  Regular rate and rhythm. No bruits. 2+ pitting edema tapering to prox tibia b/l Abdomen:  BS+, soft, NT, ND Musculoskeletal:  No muscle group atrophy or asymmetry, gait normal Neurologic:  Alert and oriented x 3, in a wheelchair Psych exam: Nml mood and affect, age appropriate judgment  and insight  Chronic systolic congestive heart failure (HCC) - Plan: CBC, Comprehensive metabolic panel  Diabetic polyneuropathy associated with type 2 diabetes mellitus ([place]) - Plan: Hemoglobin A1c, Microalbumin / creatinine urine ratio  Discharge summary and medication list have been reviewed/reconciled.  Labs pending at the time of discharge have been reviewed or are still pending at the time of this visit.  Follow-up labs and appointments have been ordered and/or coordinated appropriately.  TRANSITIONAL CARE MANAGEMENT CERTIFICATION:  I certify the following are true:   1. Communication with the patient/care giver was made within 2 business days of discharge.  2. Complexity of Medical decision making is moderate.  3. Face to face visit occurred within 14 days of discharge.   F/u in 3-6 mo pending lab results. The patient voiced understanding and agreement to the plan.  [place], DO 03/01/20 1:46 PM

## 2020-03-01 NOTE — Patient Instructions (Signed)
Give [place] 2-3 business days to get the results of your labs back.   Start the Bumex.  Keep the diet clean and stay active.  Mind the salt intake.   Let us know if you need anything.

## 2020-03-04 ENCOUNTER — Telehealth: Payer: Self-pay | Admitting: Family Medicine

## 2020-03-04 NOTE — Telephone Encounter (Signed)
[facility] requesting for  Physical Therapy   1 times a week for 1 week  2 times a week for 1 week  1 time a week for 2 week

## 2020-03-04 NOTE — Telephone Encounter (Signed)
Called HHRN informed of PCP verbal ok 

## 2020-03-05 DIAGNOSIS — N183 Chronic kidney disease, stage 3 unspecified: Secondary | ICD-10-CM | POA: Diagnosis not present

## 2020-03-05 DIAGNOSIS — I2510 Atherosclerotic heart disease of native coronary artery without angina pectoris: Secondary | ICD-10-CM | POA: Diagnosis not present

## 2020-03-05 DIAGNOSIS — E1122 Type 2 diabetes mellitus with diabetic chronic kidney disease: Secondary | ICD-10-CM | POA: Diagnosis not present

## 2020-03-05 DIAGNOSIS — I130 Hypertensive heart and chronic kidney disease with heart failure and stage 1 through stage 4 chronic kidney disease, or unspecified chronic kidney disease: Secondary | ICD-10-CM | POA: Diagnosis not present

## 2020-03-05 DIAGNOSIS — I5023 Acute on chronic systolic (congestive) heart failure: Secondary | ICD-10-CM | POA: Diagnosis not present

## 2020-03-05 DIAGNOSIS — I252 Old myocardial infarction: Secondary | ICD-10-CM | POA: Diagnosis not present

## 2020-03-08 ENCOUNTER — Telehealth: Payer: Self-pay | Admitting: Family Medicine

## 2020-03-08 DIAGNOSIS — E1122 Type 2 diabetes mellitus with diabetic chronic kidney disease: Secondary | ICD-10-CM | POA: Diagnosis not present

## 2020-03-08 DIAGNOSIS — N183 Chronic kidney disease, stage 3 unspecified: Secondary | ICD-10-CM | POA: Diagnosis not present

## 2020-03-08 DIAGNOSIS — I252 Old myocardial infarction: Secondary | ICD-10-CM | POA: Diagnosis not present

## 2020-03-08 DIAGNOSIS — I130 Hypertensive heart and chronic kidney disease with heart failure and stage 1 through stage 4 chronic kidney disease, or unspecified chronic kidney disease: Secondary | ICD-10-CM | POA: Diagnosis not present

## 2020-03-08 DIAGNOSIS — I5023 Acute on chronic systolic (congestive) heart failure: Secondary | ICD-10-CM | POA: Diagnosis not present

## 2020-03-08 DIAGNOSIS — I2510 Atherosclerotic heart disease of native coronary artery without angina pectoris: Secondary | ICD-10-CM | POA: Diagnosis not present

## 2020-03-08 MED ORDER — COLCHICINE 0.6 MG PO CAPS: 0.6000 mg | ORAL_CAPSULE | Freq: Two times a day (BID) | ORAL | 0 refills | Status: DC | PRN

## 2020-03-08 NOTE — Telephone Encounter (Signed)
refilled 

## 2020-03-08 NOTE — Telephone Encounter (Signed)
Medication: Colchicine 0.6 MG CAPS [[id number]]    Has the patient contacted their pharmacy? No. (If no, request that the patient contact the pharmacy for the refill.) (If yes, when and what did the pharmacy advise?)  Preferred Pharmacy (with phone number or street name[id number] [place], [place]  [place], [place] [address]  Phone:  [phone] Fax:  [phone]  DEA #:  [id number]  Agent: Please be advised that RX refills may take up to 3 business days. We ask that you follow-up with your pharmacy.

## 2020-03-14 DIAGNOSIS — I5023 Acute on chronic systolic (congestive) heart failure: Secondary | ICD-10-CM | POA: Diagnosis not present

## 2020-03-14 DIAGNOSIS — N183 Chronic kidney disease, stage 3 unspecified: Secondary | ICD-10-CM | POA: Diagnosis not present

## 2020-03-14 DIAGNOSIS — I130 Hypertensive heart and chronic kidney disease with heart failure and stage 1 through stage 4 chronic kidney disease, or unspecified chronic kidney disease: Secondary | ICD-10-CM | POA: Diagnosis not present

## 2020-03-14 DIAGNOSIS — I252 Old myocardial infarction: Secondary | ICD-10-CM | POA: Diagnosis not present

## 2020-03-14 DIAGNOSIS — E1122 Type 2 diabetes mellitus with diabetic chronic kidney disease: Secondary | ICD-10-CM | POA: Diagnosis not present

## 2020-03-14 DIAGNOSIS — I2510 Atherosclerotic heart disease of native coronary artery without angina pectoris: Secondary | ICD-10-CM | POA: Diagnosis not present

## 2020-03-21 ENCOUNTER — Telehealth: Payer: Self-pay | Admitting: Family Medicine

## 2020-03-21 ENCOUNTER — Telehealth: Payer: Self-pay

## 2020-03-21 DIAGNOSIS — E1122 Type 2 diabetes mellitus with diabetic chronic kidney disease: Secondary | ICD-10-CM | POA: Diagnosis not present

## 2020-03-21 DIAGNOSIS — I5023 Acute on chronic systolic (congestive) heart failure: Secondary | ICD-10-CM | POA: Diagnosis not present

## 2020-03-21 DIAGNOSIS — I2510 Atherosclerotic heart disease of native coronary artery without angina pectoris: Secondary | ICD-10-CM | POA: Diagnosis not present

## 2020-03-21 DIAGNOSIS — N183 Chronic kidney disease, stage 3 unspecified: Secondary | ICD-10-CM | POA: Diagnosis not present

## 2020-03-21 DIAGNOSIS — I130 Hypertensive heart and chronic kidney disease with heart failure and stage 1 through stage 4 chronic kidney disease, or unspecified chronic kidney disease: Secondary | ICD-10-CM | POA: Diagnosis not present

## 2020-03-21 DIAGNOSIS — I252 Old myocardial infarction: Secondary | ICD-10-CM | POA: Diagnosis not present

## 2020-03-21 NOTE — Telephone Encounter (Signed)
He's already established with Dr. Serita Grit office for Neuro. Ty.

## 2020-03-21 NOTE — Telephone Encounter (Signed)
Patient called in to see if Dr. Nani Ravens can send a referral in for a Neurologist Please follow up with the patient and let him know when the referral is done at [phone]  Please fax it to [facility] in [place]

## 2020-03-21 NOTE — Telephone Encounter (Signed)
Still no answer on patient phone.  Left message on patient spouse/girlfriend phone [phone] Hassan Rowan for him to call us back to see why he would like to change offices.  Dr. Posey Pronto is with [facility].

## 2020-03-21 NOTE — Telephone Encounter (Signed)
Need to know what the referral is needed for .  Tried to call patient but no answer and vm full  Dr. Nani Ravens do you why he needs this?

## 2020-03-22 ENCOUNTER — Emergency Department (HOSPITAL_BASED_OUTPATIENT_CLINIC_OR_DEPARTMENT_OTHER)
Admission: EM | Admit: 2020-03-22 | Discharge: 2020-03-22 | Disposition: A | Discharge status: Home / Self Care | Payer: Medicare Other | Attending: Emergency Medicine | Admitting: Emergency Medicine

## 2020-03-22 ENCOUNTER — Ambulatory Visit (INDEPENDENT_AMBULATORY_CARE_PROVIDER_SITE_OTHER): Payer: Medicare Other | Admitting: Family Medicine

## 2020-03-22 ENCOUNTER — Encounter (HOSPITAL_BASED_OUTPATIENT_CLINIC_OR_DEPARTMENT_OTHER): Payer: Self-pay | Admitting: Emergency Medicine

## 2020-03-22 ENCOUNTER — Other Ambulatory Visit: Payer: Self-pay

## 2020-03-22 ENCOUNTER — Encounter: Payer: Self-pay | Admitting: Family Medicine

## 2020-03-22 VITALS — BP 140/88 | HR 104 | Temp 96.2°F | Ht 70.0 in | Wt 167.5 lb

## 2020-03-22 DIAGNOSIS — I509 Heart failure, unspecified: Secondary | ICD-10-CM | POA: Insufficient documentation

## 2020-03-22 DIAGNOSIS — Z87891 Personal history of nicotine dependence: Secondary | ICD-10-CM | POA: Diagnosis not present

## 2020-03-22 DIAGNOSIS — I130 Hypertensive heart and chronic kidney disease with heart failure and stage 1 through stage 4 chronic kidney disease, or unspecified chronic kidney disease: Secondary | ICD-10-CM | POA: Insufficient documentation

## 2020-03-22 DIAGNOSIS — Z7982 Long term (current) use of aspirin: Secondary | ICD-10-CM | POA: Diagnosis not present

## 2020-03-22 DIAGNOSIS — M7989 Other specified soft tissue disorders: Secondary | ICD-10-CM

## 2020-03-22 DIAGNOSIS — M79605 Pain in left leg: Secondary | ICD-10-CM | POA: Diagnosis present

## 2020-03-22 DIAGNOSIS — E1122 Type 2 diabetes mellitus with diabetic chronic kidney disease: Secondary | ICD-10-CM | POA: Diagnosis not present

## 2020-03-22 DIAGNOSIS — G629 Polyneuropathy, unspecified: Secondary | ICD-10-CM | POA: Diagnosis not present

## 2020-03-22 DIAGNOSIS — Z7984 Long term (current) use of oral hypoglycemic drugs: Secondary | ICD-10-CM | POA: Diagnosis not present

## 2020-03-22 DIAGNOSIS — N183 Chronic kidney disease, stage 3 unspecified: Secondary | ICD-10-CM | POA: Diagnosis not present

## 2020-03-22 DIAGNOSIS — E1142 Type 2 diabetes mellitus with diabetic polyneuropathy: Secondary | ICD-10-CM

## 2020-03-22 DIAGNOSIS — E538 Deficiency of other specified B group vitamins: Secondary | ICD-10-CM | POA: Diagnosis not present

## 2020-03-22 DIAGNOSIS — Z79899 Other long term (current) drug therapy: Secondary | ICD-10-CM | POA: Diagnosis not present

## 2020-03-22 DIAGNOSIS — M792 Neuralgia and neuritis, unspecified: Secondary | ICD-10-CM | POA: Diagnosis not present

## 2020-03-22 LAB — COMPREHENSIVE METABOLIC PANEL
ALT: 7 U/L (ref 0–53)
AST: 24 U/L (ref 0–37)
Albumin: 3.7 g/dL (ref 3.5–5.2)
Alkaline Phosphatase: 105 U/L (ref 39–117)
BUN: 46 mg/dL — ABNORMAL HIGH (ref 6–23)
CO2: 24 mEq/L (ref 19–32)
Calcium: 9.7 mg/dL (ref 8.4–10.5)
Chloride: 98 mEq/L (ref 96–112)
Creatinine, Ser: 1.84 mg/dL — ABNORMAL HIGH (ref 0.40–1.50)
GFR: 44.97 mL/min — ABNORMAL LOW (ref >60.00)
Glucose, Bld: 114 mg/dL — ABNORMAL HIGH (ref 70–99)
Potassium: 4.2 mEq/L (ref 3.5–5.1)
Sodium: 136 mEq/L (ref 135–145)
Total Bilirubin: 1.4 mg/dL — ABNORMAL HIGH (ref 0.2–1.2)
Total Protein: 9.2 g/dL — ABNORMAL HIGH (ref 6.0–8.3)

## 2020-03-22 LAB — CBC
HCT: 34.1 % — ABNORMAL LOW (ref 39.0–52.0)
Hemoglobin: 11.2 g/dL — ABNORMAL LOW (ref 13.0–17.0)
MCHC: 32.7 g/dL (ref 30.0–36.0)
MCV: 95.9 fl (ref 78.0–100.0)
Platelets: 213 10*3/uL (ref 150.0–400.0)
RBC: 3.55 Mil/uL — ABNORMAL LOW (ref 4.22–5.81)
RDW: 17.7 % — ABNORMAL HIGH (ref 11.5–15.5)
WBC: 3.9 10*3/uL — ABNORMAL LOW (ref 4.0–10.5)

## 2020-03-22 LAB — B12 AND FOLATE PANEL
Folate: 11.2 ng/mL (ref >5.9)
Vitamin B-12: 1269 pg/mL — ABNORMAL HIGH (ref 211–911)

## 2020-03-22 MED ORDER — DEXAMETHASONE SODIUM PHOSPHATE 10 MG/ML IJ SOLN
10.0000 mg | Freq: Once | INTRAMUSCULAR | Status: AC
  Administered 2020-03-22: 10 mg via INTRAMUSCULAR
  Filled 2020-03-22: qty 1

## 2020-03-22 MED ORDER — PREDNISONE 20 MG PO TABS: 40.0000 mg | ORAL_TABLET | Freq: Every day | ORAL | 0 refills | Status: AC

## 2020-03-22 MED ORDER — DICLOFENAC SODIUM 1 % EX GEL: 4.0000 g | Freq: Four times a day (QID) | CUTANEOUS | 0 refills | Status: AC

## 2020-03-22 MED ORDER — ACETAMINOPHEN 500 MG PO TABS
1000.0000 mg | ORAL_TABLET | Freq: Once | ORAL | Status: AC
  Administered 2020-03-22: 05:00:00 1000 mg via ORAL
  Filled 2020-03-22: qty 2

## 2020-03-22 MED ORDER — GABAPENTIN 600 MG PO TABS: ORAL_TABLET | ORAL | 2 refills | Status: DC

## 2020-03-22 MED ORDER — DULOXETINE HCL 30 MG PO CPEP: 30.0000 mg | ORAL_CAPSULE | Freq: Every day | ORAL | 3 refills | Status: DC

## 2020-03-22 MED ORDER — GABAPENTIN 300 MG PO CAPS
300.0000 mg | ORAL_CAPSULE | ORAL | Status: AC
  Administered 2020-03-22: 300 mg via ORAL
  Filled 2020-03-22: qty 1

## 2020-03-22 NOTE — Telephone Encounter (Signed)
Called [place] [phone])  Per PCP requested to call today to get this patient in with Dr. Posey Pronto or partner earlier than his upcoming appt. in August. The scheduler requested we send over notes from [place] for earlier appt.//Dr. Posey Pronto can review and determine need of appointment. Once notes are complete can be faxed to [phone] Attn:Referral//Dr. Posey Pronto.

## 2020-03-22 NOTE — ED Triage Notes (Addendum)
  Patient comes in with bilateral leg pain that has been getting worse for about a week.  Patient states he is a diabetic and has bad neuropathy.  Was prescribed gabapentin for pain but states it is not helping.  States the pain is a shooting, burning pain down both legs and in both feet.  Has an appointment with specialist but not til August. Pain 10/10

## 2020-03-22 NOTE — Telephone Encounter (Signed)
Can you call him from the office phone? He probably dont answer private numbers as I am working from home.  I tried calling twice yesterday.

## 2020-03-22 NOTE — ED Provider Notes (Signed)
[facility] EMERGENCY DEPARTMENT Provider Note   CSN: [id number] Arrival date & time: 03/22/20  0334     History Chief Complaint  Patient presents with  . Leg Pain    Patrick Stewart is a 64 y.o. male.  The history is provided by the patient.  Leg Pain Location:  Foot, ankle and leg Injury: no   Leg location:  L lower leg and R lower leg Ankle location:  L ankle and R ankle Foot location:  L foot and R foot Pain details:    Quality:  Burning   Radiates to:  Does not radiate   Severity:  Severe   Onset quality:  Gradual   Timing:  Constant   Progression:  Worsening (chronic neuropathy worsening x 1 week) Foreign body present:  No foreign bodies Prior injury to area:  No Relieved by:  Nothing Worsened by:  Nothing Ineffective treatments: neurontin and nortriptiline.  Patient is scheduled to see his doctor today. Associated symptoms: no back pain, no decreased ROM, no fatigue, no fever, no itching, no muscle weakness, no neck pain, no numbness, no stiffness, no swelling and no tingling   Risk factors: no obesity   Patient with neuropathy on gabapentin and CKD presents with worsening neuropathic pain of the feet and BLE.  States the medications are not working for his pain at home.  No f/c/r.  No wounds,  No new swelling.        Past Medical History:  Diagnosis Date  . CAD (coronary artery disease) 2010   3v CABG  . CHF (congestive heart failure) ([place]) 01/26/2012  . CKD (chronic kidney disease) 02/19/2012  . COPD with chronic bronchitis ([place]) 08/26/2016  . Diabetes mellitus type 2, uncontrolled ([place]) 01/26/2012  . Elevated liver function tests 01/26/2012  . Gout 01/26/2012  . Hx of CABG 2010   RCA Stent 2008, CABG x 2 [facility] 2010  . Hypercholesterolemia 07/08/2016  . Hypertension 09/04/2014  . Ischemic cardiomyopathy 11/15/2014  . Kidney stone 10/25/2015  . Lipid disorder 01/26/2012  . Noncompliance 10/18/2018  . NSTEMI (non-ST elevated myocardial  infarction) ([place]) 07/08/2016  . ST elevation myocardial infarction (STEMI) ([place]) 2010  . Unstable angina ([place]) 11/14/2014    Patient Active Problem List   Diagnosis Date Noted  . CHF exacerbation ([place]) 09/10/2019  . Acute systolic CHF (congestive heart failure), NYHA class 3 ([place]) 09/10/2019  . Acute exacerbation of CHF (congestive heart failure) ([place]) 06/05/2019  . Uncontrolled type 2 diabetes mellitus ([place]) 06/05/2019  . Diabetic polyneuropathy associated with type 2 diabetes mellitus ([place]) 02/15/2019  . Noncompliance 10/18/2018  . Noncompliance by refusing intervention or support 04/06/2018  . Uncontrolled type 2 diabetes mellitus with hyperglycemia ([place]) 01/31/2018  . Hypertensive urgency 07/12/2017  . LBBB (left bundle branch block) 04/26/2017  . COPD with chronic bronchitis ([place]) 08/26/2016  . Kidney stone on right side 10/25/2015  . CKD (chronic kidney disease), stage III ([place]) 10/13/2015  . Gout 10/13/2015  . CAD Status post CABG 2 SVG to OM 1, LIMA to mid LAD: 10/13/2015  . Cardiomyopathy, ischemic, chronic systolic CHF 50/93/2671  . Essential hypertension 09/04/2014  . Benign prostatic hyperplasia with lower urinary tract symptoms 01/24/2013  . Peyronie's disease 12/18/2012  . Chronic joint pain 06/22/2012  . History of tobacco abuse 01/26/2012  . Inguinal hernia, right 01/26/2012    Past Surgical History:  Procedure Laterality Date  . BIV ICD INSERTION CRT-D N/A 10/21/2017   Procedure: BIV ICD INSERTION CRT-D;  Surgeon:  Constance Haw, MD;  Location: [facility];  Service: Cardiovascular;  Laterality: N/A;  . CORONARY ARTERY BYPASS GRAFT  2010  . KNEE SURGERY    . RIGHT/LEFT HEART CATH AND CORONARY ANGIOGRAPHY N/A 04/15/2017   Procedure: Right/Left Heart Cath and Coronary Angiography;  Surgeon: Larey Dresser, MD;  Location: [facility];  Service: Cardiovascular;  Laterality: N/A;  . ROTATOR CUFF REPAIR    . WRIST SURGERY         Family  History  Problem Relation Age of Onset  . Hypertension Mother   . Diabetes Mother   . Hyperlipidemia Mother   . Kidney failure Mother   . Emphysema Father     Social History   Tobacco Use  . Smoking status: Former Smoker    Packs/day: 0.50    Years: 30.00    Pack years: 15.00    Quit date: 07/10/2016    Years since quitting: 3.7  . Smokeless tobacco: Never Used  Substance Use Topics  . Alcohol use: No  . Drug use: No    Home Medications Prior to Admission medications   Medication Sig Start Date End Date Taking? Authorizing Provider  albuterol (PROVENTIL HFA;VENTOLIN HFA) 108 (90 Base) MCG/ACT inhaler Inhale 2 puffs into the lungs every 6 (six) hours as needed for wheezing or shortness of breath.    [provider]  albuterol (PROVENTIL) (2.5 MG/3ML) 0.083% nebulizer solution INHALE CONTENTS OF 1 VIAL VIA NEBULIZER EVERY 6 HOURS AS NEEDED FOR WHEEZING/SHORTNESS OF BREATH Patient taking differently: Take 2.5 mg by nebulization every 6 (six) hours as needed for wheezing or shortness of breath.  11/11/18   Shelda Pal, DO  allopurinol (ZYLOPRIM) 300 MG tablet Take 1 tablet (300 mg total) by mouth 2 (two) times daily. 02/15/19   Shelda Pal, DO  aspirin EC 81 MG EC tablet Take 1 tablet (81 mg total) by mouth daily. 04/16/17   Clegg, Amy D, NP  atorvastatin (LIPITOR) 80 MG tablet Take 1 tablet (80 mg total) by mouth daily. 08/26/16   Shelda Pal, DO  bumetanide (BUMEX) 2 MG tablet Take 1 tablet (2 mg total) by mouth 2 (two) times daily. 03/01/20   Shelda Pal, DO  carvedilol (COREG) 12.5 MG tablet Take 1 tablet (12.5 mg total) by mouth 2 (two) times daily with a meal. 09/14/19 10/14/19  Spongberg, Audie Pinto, MD  Colchicine 0.6 MG CAPS Take 0.6 mg by mouth 2 (two) times daily as needed (gout). 03/08/20   Shelda Pal, DO  diclofenac Sodium (VOLTAREN) 1 % GEL Apply 4 g topically 4 (four) times daily. 03/22/20   Addalyne Vandehei,  MD  digoxin (LANOXIN) 0.125 MG tablet Take 1 tablet (125 mcg total) by mouth daily. 06/10/19   [place] Donnamarie Poag, DO  DULoxetine (CYMBALTA) 20 MG capsule TAKE 1 CAPSULE BY MOUTH EVERY DAY Patient taking differently: Take 20 mg by mouth daily.  06/07/19   Shelda Pal, DO  Elastic Bandages & Supports (GAIT/TRANSFER BELT) MISC Use as needed 10/06/19   Shelda Pal, DO  gabapentin (NEURONTIN) 600 MG tablet Take 1 tablet (600 mg total) by mouth 2 (two) times daily. 05/12/19   Shelda Pal, DO  glipiZIDE (GLUCOTROL) 5 MG tablet Take 1 tablet (5 mg total) by mouth daily before breakfast. 05/12/19   Wendling, Crosby Oyster, DO  glucose blood test strip Use as instructed 08/26/16   Shelda Pal, DO  Lancets (FREESTYLE) lancets Use to  check sugars daily 08/26/16   Shelda Pal, DO  meloxicam (MOBIC) 15 MG tablet TAKE 1 TABLET BY MOUTH EVERY DAY 10/19/19   Shelda Pal, DO  nortriptyline (PAMELOR) 10 MG capsule TAKE 1 CAPSULE (10 MG TOTAL) BY MOUTH AT BEDTIME. 07/25/19   Patel, Donika K, DO  sacubitril-valsartan (ENTRESTO) 49-51 MG Take 1 tablet by mouth 2 (two) times daily. 06/10/19   [place] Donnamarie Poag, DO    Allergies    Amitriptyline, Hydrocodone, and Tramadol  Review of Systems   Review of Systems  Constitutional: Negative for fatigue and fever.  HENT: Negative for congestion.   Eyes: Negative for visual disturbance.  Respiratory: Negative for cough and shortness of breath.   Cardiovascular: Negative for chest pain.  Gastrointestinal: Negative for abdominal pain.  Genitourinary: Negative for difficulty urinating.  Musculoskeletal: Positive for arthralgias. Negative for back pain, joint swelling, neck pain and stiffness.  Skin: Negative for itching.  Neurological: Negative for dizziness, weakness and numbness.  All other systems reviewed and are negative.   Physical Exam Updated Vital Signs BP (!) 155/108 (BP Location: Right Arm)    Pulse 96   Temp 98.2 F (36.8 C) (Oral)   Resp 18   Ht 5\' 10"  (5.027 m)   Wt 78.5 kg   SpO2 98%   BMI 24.82 kg/m   Physical Exam Vitals and nursing note reviewed.  Constitutional:      General: He is not in acute distress.    Appearance: Normal appearance.  HENT:     Head: Normocephalic and atraumatic.     Nose: Nose normal.  Eyes:     Conjunctiva/sclera: Conjunctivae normal.     Pupils: Pupils are equal, round, and reactive to light.  Cardiovascular:     Rate and Rhythm: Normal rate and regular rhythm.     Pulses: Normal pulses.     Heart sounds: Normal heart sounds.  Pulmonary:     Effort: Pulmonary effort is normal.     Breath sounds: Normal breath sounds.  Abdominal:     General: Abdomen is flat. Bowel sounds are normal.     Tenderness: There is no abdominal tenderness. There is no guarding.  Musculoskeletal:        General: Normal range of motion.     Cervical back: Normal range of motion and neck supple.     Right lower leg: 1+ Edema present.     Left lower leg: 1+ Edema present.     Right ankle: Normal. Normal pulse.     Right Achilles Tendon: Normal.     Left ankle: Normal. Normal pulse.     Left Achilles Tendon: Normal.     Right foot: Normal. Normal capillary refill. No tenderness, bony tenderness or crepitus. Normal pulse.     Left foot: Normal. Normal capillary refill. No tenderness, bony tenderness or crepitus. Normal pulse.  Skin:    General: Skin is warm and dry.     Capillary Refill: Capillary refill takes less than 2 seconds.     Findings: No erythema.  Neurological:     General: No focal deficit present.     Mental Status: He is alert and oriented to person, place, and time.  Psychiatric:        Mood and Affect: Mood normal.        Behavior: Behavior normal.     ED Results / Procedures / Treatments   Labs (all labs ordered are listed, but only abnormal results are displayed) Labs Reviewed -  No data to display  EKG None  Radiology No  results found.  Procedures Procedures (including critical care time)  Medications Ordered in ED Medications  gabapentin (NEURONTIN) capsule 300 mg (has no administration in time range)  acetaminophen (TYLENOL) tablet 1,000 mg (has no administration in time range)  dexamethasone (DECADRON) injection 10 mg (has no administration in time range)    ED Course  I have reviewed the triage vital signs and the nursing notes.  Pertinent labs & imaging results that were available during my care of the patient were reviewed by me and considered in my medical decision making (see chart for details).  Symptoms are consistent with neuroapthy.  Given CKD with recent elevated creatinine patient cannot get oral NSAIDs.  He is only on BID neurontin and I have ordered another dose.  I will start topical NSAIDs and refer to PMD for alternative treatments.  There are no signs of trauma nor infection and I do not feel imaging and or labs are indicated at this time.    Knut Rondinelli was evaluated in Emergency Department on 03/22/2020 for the symptoms described in the history of present illness. He was evaluated in the context of the global COVID-19 pandemic, which necessitated consideration that the patient might be at risk for infection with the SARS-CoV-2 virus that causes COVID-19. Institutional protocols and algorithms that pertain to the evaluation of patients at risk for COVID-19 are in a state of rapid change based on information released by regulatory bodies including the CDC and federal and state organizations. These policies and algorithms were followed during the patient's care in the ED.  Final Clinical Impression(s) / ED Diagnoses Final diagnoses:  Neuropathy   Return for weakness, numbness, changes in vision or speech, fevers >100.4 unrelieved by medication, shortness of breath, intractable vomiting, or diarrhea, abdominal pain, Inability to tolerate liquids or food, cough, altered mental status or  any concerns. No signs of systemic illness or infection. The patient is nontoxic-appearing on exam and vital signs are within normal limits.   I have reviewed the triage vital signs and the nursing notes. Pertinent labs &imaging results that were available during my care of the patient were reviewed by me and considered in my medical decision making (see chart for details).  After history, exam, and medical workup I feel the patient has been appropriately medically screened and is safe for discharge home. Pertinent diagnoses were discussed with the patient. Patient was givenstrictreturn precautions. Rx / DC Orders ED Discharge Orders         Ordered    diclofenac Sodium (VOLTAREN) 1 % GEL  4 times daily     03/22/20 0444           Kiran Carline, MD 03/22/20 0451

## 2020-03-22 NOTE — Patient Instructions (Addendum)
Start prednisone tomorrow.  If you do not hear anything about your referral in the next 1-2 weeks, call our office and ask for an update.  We are increasing the dose of your Cymbalta to 30 mg daily.   We are increasing the nighttime dose of your gabapentin to 1200 mg (2 tabs).   Cancel follow up appt with me if you are able to get in with Neurologist within 2 weeks.   Let us know if you need anything.

## 2020-03-22 NOTE — Progress Notes (Signed)
Chief Complaint  Patient presents with  . Peripheral Neuropathy  . Insomnia    Subjective: Patient is a 64 y.o. male here for neuropathy.  Over the past 5 days, the patient has been having worsening neuropathic pain.  It is normally just in his lower extremities but has spread to the rest of his body including his torso and upper extremities.  No inciting incident or other pain.  Seems to be constant.  Describes the pain as sharp and burning.  He is not having any numbness or decreased sensation.  It is affecting his gait and he feels more off balance.  He is taking Cymbalta 20 mg daily, nortriptyline 10 mg nightly, and gabapentin 600 mg twice daily.  He has seen a neurologist for this issue but he cannot get in until August.  He was given a shot of steroid in the emergency department which is where he was prior to coming here.  He states that that gave him some relief.  He has not been ill recently.  Past Medical History:  Diagnosis Date  . CAD (coronary artery disease) 2010   3v CABG  . CHF (congestive heart failure) ([place]) 01/26/2012  . CKD (chronic kidney disease) 02/19/2012  . COPD with chronic bronchitis ([place]) 08/26/2016  . Diabetes mellitus type 2, uncontrolled ([place]) 01/26/2012  . Elevated liver function tests 01/26/2012  . Gout 01/26/2012  . Hx of CABG 2010   RCA Stent 2008, CABG x 2 [facility] 2010  . Hypercholesterolemia 07/08/2016  . Hypertension 09/04/2014  . Ischemic cardiomyopathy 11/15/2014  . Kidney stone 10/25/2015  . Lipid disorder 01/26/2012  . Noncompliance 10/18/2018  . NSTEMI (non-ST elevated myocardial infarction) ([place]) 07/08/2016  . ST elevation myocardial infarction (STEMI) ([place]) 2010  . Unstable angina ([place]) 11/14/2014    Objective: BP 140/88 (BP Location: Left Arm, Patient Position: Sitting, Cuff Size: Normal)   Pulse (!) 104   Temp (!) 96.2 F (35.7 C) (Temporal)   Ht 5\' 10"  (1.778 m)   Wt 167 lb 8 oz (76 kg)   SpO2 93%   BMI 24.03 kg/m  General: Awake,  appears stated age Neuro: Sensation decreased to pinprick on LE's. Intact on upper ext. Gait slightly unsteady. No cerebellar signs.  Lungs: No accessory muscle use Psych: Age appropriate judgment and insight, normal affect and mood  Assessment and Plan: Neuropathy - Plan: Ambulatory referral to Neurology, CBC, Comprehensive metabolic panel  Neuropathic pain  Swelling of both lower extremities  Folate deficiency - Plan: B12 and Folate Panel  Diabetic polyneuropathy associated with type 2 diabetes mellitus ([place]) - Plan: gabapentin (NEURONTIN) 600 MG tablet  Increase does of Neurontin to 600 mg in AM, 1200 mg in PM. Will ck some labs and also increase Cymbalta from 20 mg/d to 30 mg/d. Cont Nortriptyline. Will refer to [facility] today, but will also ck with [facility] to ensure that the wait time for Dr. Posey Pronto is really 5 months.  I will see him in 10 d if no better and if he has not been scheduled w Neuro.  The patient voiced understanding and agreement to the plan.  Lu Verne, DO 03/22/20  9:43 AM

## 2020-03-25 ENCOUNTER — Other Ambulatory Visit: Payer: Self-pay | Admitting: Family Medicine

## 2020-03-26 ENCOUNTER — Telehealth: Payer: Self-pay | Admitting: Family Medicine

## 2020-03-26 DIAGNOSIS — I2510 Atherosclerotic heart disease of native coronary artery without angina pectoris: Secondary | ICD-10-CM | POA: Diagnosis not present

## 2020-03-26 DIAGNOSIS — N183 Chronic kidney disease, stage 3 unspecified: Secondary | ICD-10-CM | POA: Diagnosis not present

## 2020-03-26 DIAGNOSIS — I5023 Acute on chronic systolic (congestive) heart failure: Secondary | ICD-10-CM | POA: Diagnosis not present

## 2020-03-26 DIAGNOSIS — I130 Hypertensive heart and chronic kidney disease with heart failure and stage 1 through stage 4 chronic kidney disease, or unspecified chronic kidney disease: Secondary | ICD-10-CM | POA: Diagnosis not present

## 2020-03-26 DIAGNOSIS — E1122 Type 2 diabetes mellitus with diabetic chronic kidney disease: Secondary | ICD-10-CM | POA: Diagnosis not present

## 2020-03-26 DIAGNOSIS — I252 Old myocardial infarction: Secondary | ICD-10-CM | POA: Diagnosis not present

## 2020-03-26 NOTE — Telephone Encounter (Signed)
Per my note: "Increase does of Neurontin to 600 mg in AM, 1200 mg in PM. Will ck some labs and also increase Cymbalta from 20 mg/d to 30 mg/d."

## 2020-03-26 NOTE — Telephone Encounter (Signed)
  [phone]  Patient is wondering if he should still take :  gabapentin (NEURONTIN) 600 MG tablet [[id number]]  Patient states that Don[facility] prescribed something else last week.   Please advise

## 2020-03-27 NOTE — Telephone Encounter (Signed)
Called informed of PCP instructions. The patient verbalized understanding. And had not other questions or concerns.

## 2020-03-27 NOTE — Telephone Encounter (Signed)
Called no answer mailbox full

## 2020-03-30 ENCOUNTER — Other Ambulatory Visit: Payer: Self-pay | Admitting: Family Medicine

## 2020-04-03 ENCOUNTER — Other Ambulatory Visit: Payer: Self-pay

## 2020-04-05 ENCOUNTER — Other Ambulatory Visit: Payer: Self-pay

## 2020-04-08 ENCOUNTER — Other Ambulatory Visit: Payer: Self-pay

## 2020-04-08 ENCOUNTER — Ambulatory Visit (INDEPENDENT_AMBULATORY_CARE_PROVIDER_SITE_OTHER): Payer: Medicare Other | Admitting: Family Medicine

## 2020-04-08 ENCOUNTER — Encounter: Payer: Self-pay | Admitting: Family Medicine

## 2020-04-08 VITALS — BP 126/78 | HR 88 | Temp 98.2°F | Resp 12 | Ht 70.0 in | Wt 186.0 lb

## 2020-04-08 DIAGNOSIS — M7989 Other specified soft tissue disorders: Secondary | ICD-10-CM | POA: Diagnosis not present

## 2020-04-08 DIAGNOSIS — M792 Neuralgia and neuritis, unspecified: Secondary | ICD-10-CM | POA: Insufficient documentation

## 2020-04-08 MED ORDER — NORTRIPTYLINE HCL 25 MG PO CAPS: 25.0000 mg | ORAL_CAPSULE | Freq: Every day | ORAL | 2 refills | Status: DC

## 2020-04-08 NOTE — Progress Notes (Signed)
Chief Complaint  Patient presents with  . nerve pain    Subjective: Patient is a 64 y.o. male here for f/u nerve pain.  Gabapentin increased in addn to Cymbalta. Less freq at night. Having shooting pain that is followed by burning. Q5 min during day, 2 times per night. No change in this after med change.   Gained 20 lbs over past 1.5 weeks. Hx of CHF. Renal function stable per labs 1 mo ago (CKD3). He does not elevate legs, exercise routinely, wear compression stockings. Ate ribs over weekend. No SOB or cough.   Past Medical History:  Diagnosis Date  . CAD (coronary artery disease) 2010   3v CABG  . CHF (congestive heart failure) ([place]) 01/26/2012  . CKD (chronic kidney disease) 02/19/2012  . COPD with chronic bronchitis ([place]) 08/26/2016  . Diabetes mellitus type 2, uncontrolled ([place]) 01/26/2012  . Elevated liver function tests 01/26/2012  . Gout 01/26/2012  . Hx of CABG 2010   RCA Stent 2008, CABG x 2 [facility] 2010  . Hypercholesterolemia 07/08/2016  . Hypertension 09/04/2014  . Ischemic cardiomyopathy 11/15/2014  . Kidney stone 10/25/2015  . Lipid disorder 01/26/2012  . Noncompliance 10/18/2018  . NSTEMI (non-ST elevated myocardial infarction) ([place]) 07/08/2016  . ST elevation myocardial infarction (STEMI) ([place]) 2010  . Unstable angina ([place]) 11/14/2014    Objective: BP 126/78 (BP Location: Left Arm, Cuff Size: Normal)   Pulse 88   Temp 98.2 F (36.8 C) (Temporal)   Resp 12   Ht 5\' 10"  (1.778 m)   Wt 186 lb (84.4 kg)   SpO2 98%   BMI 26.69 kg/m  General: Awake, appears stated age HEENT: MMM, EOMi Heart: RRR, 3+ pitting b/l LE edema tapering at knees; I did not appreciate any carotid bruits MSK: No ttp over thighs or calves b/l Lungs: CTAB, no rales, wheezes or rhonchi. No accessory muscle use Psych: Age appropriate judgment and insight, normal affect and mood  Assessment and Plan: Neuropathic pain - Plan: nortriptyline (PAMELOR) 25 MG capsule  Localized swelling of  both lower extremities  Cont gabapentin. Increase dosage of Pamelor. Stop Cymbalta for concern of serotonin syndrome. Will look into neuro referral.  F/u in 3-4 weeks if unable to get in with neuro.  The patient voiced understanding and agreement to the plan.  Rodman, DO 04/08/20  9:57 AM

## 2020-04-08 NOTE — Patient Instructions (Addendum)
Try topical capsaicin cream.   We are looking into your neurology referral.   Stop the Cymbalta (capsule), we are continuing gabapentin. We are increasing the dose slightly on a another medication.   Mind the salt intake.  Let us know if you need anything.

## 2020-04-15 ENCOUNTER — Other Ambulatory Visit: Payer: Self-pay | Admitting: Family Medicine

## 2020-04-16 DIAGNOSIS — N1832 Chronic kidney disease, stage 3b: Secondary | ICD-10-CM | POA: Diagnosis not present

## 2020-04-16 DIAGNOSIS — Z8616 Personal history of COVID-19: Secondary | ICD-10-CM | POA: Diagnosis not present

## 2020-04-16 DIAGNOSIS — I248 Other forms of acute ischemic heart disease: Secondary | ICD-10-CM | POA: Diagnosis not present

## 2020-04-16 DIAGNOSIS — N179 Acute kidney failure, unspecified: Secondary | ICD-10-CM | POA: Diagnosis not present

## 2020-04-16 DIAGNOSIS — R1084 Generalized abdominal pain: Secondary | ICD-10-CM | POA: Diagnosis not present

## 2020-04-16 DIAGNOSIS — I130 Hypertensive heart and chronic kidney disease with heart failure and stage 1 through stage 4 chronic kidney disease, or unspecified chronic kidney disease: Secondary | ICD-10-CM | POA: Diagnosis not present

## 2020-04-16 DIAGNOSIS — R0602 Shortness of breath: Secondary | ICD-10-CM | POA: Diagnosis not present

## 2020-04-16 DIAGNOSIS — I509 Heart failure, unspecified: Secondary | ICD-10-CM | POA: Diagnosis not present

## 2020-04-16 DIAGNOSIS — I5043 Acute on chronic combined systolic (congestive) and diastolic (congestive) heart failure: Secondary | ICD-10-CM | POA: Diagnosis not present

## 2020-04-16 DIAGNOSIS — N183 Chronic kidney disease, stage 3 unspecified: Secondary | ICD-10-CM | POA: Diagnosis not present

## 2020-04-16 DIAGNOSIS — I351 Nonrheumatic aortic (valve) insufficiency: Secondary | ICD-10-CM | POA: Diagnosis not present

## 2020-04-16 DIAGNOSIS — R188 Other ascites: Secondary | ICD-10-CM | POA: Diagnosis not present

## 2020-04-16 DIAGNOSIS — Z950 Presence of cardiac pacemaker: Secondary | ICD-10-CM | POA: Diagnosis not present

## 2020-04-16 DIAGNOSIS — M7989 Other specified soft tissue disorders: Secondary | ICD-10-CM | POA: Diagnosis not present

## 2020-04-16 DIAGNOSIS — R0689 Other abnormalities of breathing: Secondary | ICD-10-CM | POA: Diagnosis not present

## 2020-04-16 DIAGNOSIS — D649 Anemia, unspecified: Secondary | ICD-10-CM | POA: Diagnosis not present

## 2020-04-16 DIAGNOSIS — M10441 Other secondary gout, right hand: Secondary | ICD-10-CM | POA: Diagnosis not present

## 2020-04-16 DIAGNOSIS — I10 Essential (primary) hypertension: Secondary | ICD-10-CM | POA: Diagnosis not present

## 2020-04-16 DIAGNOSIS — R140 Abdominal distension (gaseous): Secondary | ICD-10-CM | POA: Diagnosis not present

## 2020-04-16 DIAGNOSIS — R52 Pain, unspecified: Secondary | ICD-10-CM | POA: Diagnosis not present

## 2020-04-17 DIAGNOSIS — R188 Other ascites: Secondary | ICD-10-CM | POA: Diagnosis present

## 2020-04-17 DIAGNOSIS — E785 Hyperlipidemia, unspecified: Secondary | ICD-10-CM | POA: Diagnosis present

## 2020-04-17 DIAGNOSIS — Z8616 Personal history of COVID-19: Secondary | ICD-10-CM | POA: Diagnosis not present

## 2020-04-17 DIAGNOSIS — R109 Unspecified abdominal pain: Secondary | ICD-10-CM | POA: Diagnosis not present

## 2020-04-17 DIAGNOSIS — E1122 Type 2 diabetes mellitus with diabetic chronic kidney disease: Secondary | ICD-10-CM | POA: Diagnosis present

## 2020-04-17 DIAGNOSIS — D649 Anemia, unspecified: Secondary | ICD-10-CM | POA: Diagnosis present

## 2020-04-17 DIAGNOSIS — I5043 Acute on chronic combined systolic (congestive) and diastolic (congestive) heart failure: Secondary | ICD-10-CM | POA: Diagnosis present

## 2020-04-17 DIAGNOSIS — Z87891 Personal history of nicotine dependence: Secondary | ICD-10-CM | POA: Diagnosis not present

## 2020-04-17 DIAGNOSIS — Z9119 Patient's noncompliance with other medical treatment and regimen: Secondary | ICD-10-CM | POA: Diagnosis not present

## 2020-04-17 DIAGNOSIS — E1140 Type 2 diabetes mellitus with diabetic neuropathy, unspecified: Secondary | ICD-10-CM | POA: Diagnosis present

## 2020-04-17 DIAGNOSIS — M109 Gout, unspecified: Secondary | ICD-10-CM | POA: Diagnosis present

## 2020-04-17 DIAGNOSIS — I130 Hypertensive heart and chronic kidney disease with heart failure and stage 1 through stage 4 chronic kidney disease, or unspecified chronic kidney disease: Secondary | ICD-10-CM | POA: Diagnosis present

## 2020-04-17 DIAGNOSIS — Z8701 Personal history of pneumonia (recurrent): Secondary | ICD-10-CM | POA: Diagnosis not present

## 2020-04-17 DIAGNOSIS — J449 Chronic obstructive pulmonary disease, unspecified: Secondary | ICD-10-CM | POA: Diagnosis present

## 2020-04-17 DIAGNOSIS — N183 Chronic kidney disease, stage 3 unspecified: Secondary | ICD-10-CM | POA: Diagnosis present

## 2020-04-17 DIAGNOSIS — I2510 Atherosclerotic heart disease of native coronary artery without angina pectoris: Secondary | ICD-10-CM | POA: Diagnosis present

## 2020-04-17 DIAGNOSIS — I248 Other forms of acute ischemic heart disease: Secondary | ICD-10-CM | POA: Diagnosis present

## 2020-04-17 DIAGNOSIS — I351 Nonrheumatic aortic (valve) insufficiency: Secondary | ICD-10-CM | POA: Diagnosis not present

## 2020-04-17 DIAGNOSIS — K8020 Calculus of gallbladder without cholecystitis without obstruction: Secondary | ICD-10-CM | POA: Diagnosis not present

## 2020-04-17 DIAGNOSIS — F329 Major depressive disorder, single episode, unspecified: Secondary | ICD-10-CM | POA: Diagnosis present

## 2020-04-17 DIAGNOSIS — Z951 Presence of aortocoronary bypass graft: Secondary | ICD-10-CM | POA: Diagnosis not present

## 2020-04-17 DIAGNOSIS — I5023 Acute on chronic systolic (congestive) heart failure: Secondary | ICD-10-CM | POA: Diagnosis not present

## 2020-04-17 DIAGNOSIS — N179 Acute kidney failure, unspecified: Secondary | ICD-10-CM | POA: Diagnosis present

## 2020-04-17 MED ORDER — ONDANSETRON HCL 4 MG/2ML IJ SOLN
4.00 | INTRAMUSCULAR | Status: DC
Start: ? — End: 2020-04-17

## 2020-04-17 MED ORDER — INSULIN LISPRO 100 UNIT/ML ~~LOC~~ SOLN
2.00 | SUBCUTANEOUS | Status: DC
Start: 2020-04-23 — End: 2020-04-17

## 2020-04-17 MED ORDER — PANCOF HC 15-2-3 MG/5ML OR SYRP
1.00 | ORAL_SOLUTION | ORAL | Status: DC
Start: ? — End: 2020-04-17

## 2020-04-17 MED ORDER — ACETAMINOPHEN 325 MG PO TABS
650.00 | ORAL_TABLET | ORAL | Status: DC
Start: ? — End: 2020-04-17

## 2020-04-17 MED ORDER — ALBUMIN HUMAN 25 % IV SOLN
50.00 | INTRAVENOUS | Status: DC
Start: 2020-04-17 — End: 2020-04-17

## 2020-04-17 MED ORDER — CARVEDILOL 12.5 MG PO TABS
12.50 | ORAL_TABLET | ORAL | Status: DC
Start: 2020-04-22 — End: 2020-04-17

## 2020-04-17 MED ORDER — FERROUS SULFATE 325 (65 FE) MG PO TABS
325.00 | ORAL_TABLET | ORAL | Status: DC
Start: 2020-04-23 — End: 2020-04-17

## 2020-04-17 MED ORDER — MAGNESIUM OXIDE 400 MG PO TABS
400.00 | ORAL_TABLET | ORAL | Status: DC
Start: 2020-04-22 — End: 2020-04-17

## 2020-04-17 MED ORDER — ASPIRIN 81 MG PO CHEW
81.00 | CHEWABLE_TABLET | ORAL | Status: DC
Start: 2020-04-23 — End: 2020-04-17

## 2020-04-17 MED ORDER — FUROSEMIDE 10 MG/ML IJ SOLN
20.00 | INTRAMUSCULAR | Status: DC
Start: 2020-04-17 — End: 2020-04-17

## 2020-04-17 MED ORDER — ALBUTEROL SULFATE HFA 108 (90 BASE) MCG/ACT IN AERS
2.00 | INHALATION_SPRAY | RESPIRATORY_TRACT | Status: DC
Start: ? — End: 2020-04-17

## 2020-04-17 MED ORDER — GLUCOSE 40 % PO GEL
15.00 | ORAL | Status: DC
Start: ? — End: 2020-04-17

## 2020-04-17 MED ORDER — HEPARIN SODIUM (PORCINE) 5000 UNIT/ML IJ SOLN
5000.00 | INTRAMUSCULAR | Status: DC
Start: 2020-04-22 — End: 2020-04-17

## 2020-04-17 MED ORDER — STRI-DEX MAXIMUM STRENGTH 2 % EX PADS
125.00 | MEDICATED_PAD | CUTANEOUS | Status: DC
Start: ? — End: 2020-04-17

## 2020-04-17 MED ORDER — BUMETANIDE 1 MG PO TABS
2.00 | ORAL_TABLET | ORAL | Status: DC
Start: 2020-04-17 — End: 2020-04-17

## 2020-04-17 MED ORDER — DIGOXIN 125 MCG PO TABS
125.00 | ORAL_TABLET | ORAL | Status: DC
Start: 2020-04-23 — End: 2020-04-17

## 2020-04-17 MED ORDER — ALLOPURINOL 300 MG PO TABS
300.00 | ORAL_TABLET | ORAL | Status: DC
Start: 2020-04-23 — End: 2020-04-17

## 2020-04-17 MED ORDER — GABAPENTIN 400 MG PO CAPS
400.00 | ORAL_CAPSULE | ORAL | Status: DC
Start: 2020-04-22 — End: 2020-04-17

## 2020-04-22 MED ORDER — ATORVASTATIN CALCIUM 10 MG PO TABS
10.00 | ORAL_TABLET | ORAL | Status: DC
Start: 2020-04-23 — End: 2020-04-22

## 2020-04-22 MED ORDER — DSS 100 MG PO CAPS
100.00 | ORAL_CAPSULE | ORAL | Status: DC
Start: 2020-04-22 — End: 2020-04-22

## 2020-04-22 MED ORDER — SPIRONOLACTONE 25 MG PO TABS
50.00 | ORAL_TABLET | ORAL | Status: DC
Start: 2020-04-23 — End: 2020-04-22

## 2020-04-22 MED ORDER — BUMETANIDE 1 MG PO TABS
1.00 | ORAL_TABLET | ORAL | Status: DC
Start: 2020-04-23 — End: 2020-04-22

## 2020-04-22 MED ORDER — POLYETHYLENE GLYCOL 3350 17 GM/SCOOP PO POWD
17.00 | ORAL | Status: DC
Start: 2020-04-23 — End: 2020-04-22

## 2020-04-22 MED ORDER — OXYCODONE HCL 5 MG/5ML PO SOLN
5.00 | ORAL | Status: DC
Start: ? — End: 2020-04-22

## 2020-04-24 ENCOUNTER — Telehealth: Payer: Self-pay | Admitting: Family Medicine

## 2020-04-24 NOTE — Telephone Encounter (Signed)
Called and scheduled appt/telephone visit for Friday 04/26/20 at 2:30

## 2020-04-24 NOTE — Telephone Encounter (Signed)
Medicare will require a face to face (or virtual) encounter to approve home services if the hospital didn't set this up. We can discuss at a hosp f/u visit. Ty.

## 2020-04-24 NOTE — Telephone Encounter (Signed)
Caller: Nalin Mazzocco  Call Back # [phone]  Subject: Requesting a Referral   Patient is recently hospitalized with fluid retention,PT D/C to home. patient states that he is weak needs assistance at home. Patient states unable to walk well.   Please advise

## 2020-04-26 ENCOUNTER — Other Ambulatory Visit: Payer: Self-pay

## 2020-04-26 ENCOUNTER — Ambulatory Visit (INDEPENDENT_AMBULATORY_CARE_PROVIDER_SITE_OTHER): Payer: Medicare Other | Admitting: Family Medicine

## 2020-04-26 DIAGNOSIS — Z5329 Procedure and treatment not carried out because of patient's decision for other reasons: Secondary | ICD-10-CM

## 2020-04-26 DIAGNOSIS — Z91199 Patient's noncompliance with other medical treatment and regimen due to unspecified reason: Secondary | ICD-10-CM

## 2020-04-26 NOTE — Progress Notes (Signed)
Despite several attempts to contact the patient, we could not get ahold of him.

## 2020-04-30 ENCOUNTER — Other Ambulatory Visit: Payer: Self-pay | Admitting: Family Medicine

## 2020-04-30 DIAGNOSIS — M792 Neuralgia and neuritis, unspecified: Secondary | ICD-10-CM

## 2020-05-04 ENCOUNTER — Other Ambulatory Visit: Payer: Self-pay | Admitting: Family Medicine

## 2020-05-04 DIAGNOSIS — E1142 Type 2 diabetes mellitus with diabetic polyneuropathy: Secondary | ICD-10-CM

## 2020-05-06 ENCOUNTER — Telehealth: Payer: Self-pay

## 2020-05-06 NOTE — Telephone Encounter (Signed)
Prescription refilled this morning already.

## 2020-05-06 NOTE — Telephone Encounter (Signed)
Patient called in to get a prescription refill for  gabapentin (NEURONTIN) 600 MG tablet [[id number]]    Please send it to [id number] - [place], [place] - [address] AT [address]  [place], [place] [address]  Phone:  [phone] Fax:  [phone]  DEA #:  [id number]

## 2020-05-25 DIAGNOSIS — E785 Hyperlipidemia, unspecified: Secondary | ICD-10-CM | POA: Diagnosis present

## 2020-05-25 DIAGNOSIS — N183 Chronic kidney disease, stage 3 unspecified: Secondary | ICD-10-CM | POA: Diagnosis present

## 2020-05-25 DIAGNOSIS — I2510 Atherosclerotic heart disease of native coronary artery without angina pectoris: Secondary | ICD-10-CM | POA: Diagnosis present

## 2020-05-25 DIAGNOSIS — Z95810 Presence of automatic (implantable) cardiac defibrillator: Secondary | ICD-10-CM | POA: Diagnosis not present

## 2020-05-25 DIAGNOSIS — K5900 Constipation, unspecified: Secondary | ICD-10-CM | POA: Diagnosis present

## 2020-05-25 DIAGNOSIS — R7989 Other specified abnormal findings of blood chemistry: Secondary | ICD-10-CM | POA: Diagnosis not present

## 2020-05-25 DIAGNOSIS — E871 Hypo-osmolality and hyponatremia: Secondary | ICD-10-CM | POA: Diagnosis not present

## 2020-05-25 DIAGNOSIS — M109 Gout, unspecified: Secondary | ICD-10-CM | POA: Diagnosis present

## 2020-05-25 DIAGNOSIS — I5023 Acute on chronic systolic (congestive) heart failure: Secondary | ICD-10-CM | POA: Diagnosis not present

## 2020-05-25 DIAGNOSIS — Z8616 Personal history of COVID-19: Secondary | ICD-10-CM | POA: Diagnosis not present

## 2020-05-25 DIAGNOSIS — R609 Edema, unspecified: Secondary | ICD-10-CM | POA: Diagnosis not present

## 2020-05-25 DIAGNOSIS — D631 Anemia in chronic kidney disease: Secondary | ICD-10-CM | POA: Diagnosis not present

## 2020-05-25 DIAGNOSIS — E1122 Type 2 diabetes mellitus with diabetic chronic kidney disease: Secondary | ICD-10-CM | POA: Diagnosis present

## 2020-05-25 DIAGNOSIS — N179 Acute kidney failure, unspecified: Secondary | ICD-10-CM | POA: Diagnosis not present

## 2020-05-25 DIAGNOSIS — R0602 Shortness of breath: Secondary | ICD-10-CM | POA: Diagnosis not present

## 2020-05-25 DIAGNOSIS — Z8701 Personal history of pneumonia (recurrent): Secondary | ICD-10-CM | POA: Diagnosis not present

## 2020-05-25 DIAGNOSIS — K219 Gastro-esophageal reflux disease without esophagitis: Secondary | ICD-10-CM | POA: Diagnosis present

## 2020-05-25 DIAGNOSIS — Z79899 Other long term (current) drug therapy: Secondary | ICD-10-CM | POA: Diagnosis not present

## 2020-05-25 DIAGNOSIS — Z20822 Contact with and (suspected) exposure to covid-19: Secondary | ICD-10-CM | POA: Diagnosis not present

## 2020-05-25 DIAGNOSIS — Z951 Presence of aortocoronary bypass graft: Secondary | ICD-10-CM | POA: Diagnosis not present

## 2020-05-25 DIAGNOSIS — I10 Essential (primary) hypertension: Secondary | ICD-10-CM | POA: Diagnosis not present

## 2020-05-25 DIAGNOSIS — I110 Hypertensive heart disease with heart failure: Secondary | ICD-10-CM | POA: Diagnosis not present

## 2020-05-25 DIAGNOSIS — R778 Other specified abnormalities of plasma proteins: Secondary | ICD-10-CM | POA: Diagnosis not present

## 2020-05-25 DIAGNOSIS — Z950 Presence of cardiac pacemaker: Secondary | ICD-10-CM | POA: Diagnosis not present

## 2020-05-25 DIAGNOSIS — I5020 Unspecified systolic (congestive) heart failure: Secondary | ICD-10-CM | POA: Diagnosis not present

## 2020-05-25 DIAGNOSIS — R2243 Localized swelling, mass and lump, lower limb, bilateral: Secondary | ICD-10-CM | POA: Diagnosis not present

## 2020-05-25 DIAGNOSIS — J449 Chronic obstructive pulmonary disease, unspecified: Secondary | ICD-10-CM | POA: Diagnosis present

## 2020-05-25 DIAGNOSIS — D649 Anemia, unspecified: Secondary | ICD-10-CM | POA: Diagnosis present

## 2020-05-25 DIAGNOSIS — I130 Hypertensive heart and chronic kidney disease with heart failure and stage 1 through stage 4 chronic kidney disease, or unspecified chronic kidney disease: Secondary | ICD-10-CM | POA: Diagnosis present

## 2020-05-25 DIAGNOSIS — R05 Cough: Secondary | ICD-10-CM | POA: Diagnosis not present

## 2020-05-29 ENCOUNTER — Other Ambulatory Visit: Payer: Self-pay | Admitting: Family Medicine

## 2020-06-01 DIAGNOSIS — Z951 Presence of aortocoronary bypass graft: Secondary | ICD-10-CM | POA: Diagnosis not present

## 2020-06-01 DIAGNOSIS — Z87891 Personal history of nicotine dependence: Secondary | ICD-10-CM | POA: Diagnosis not present

## 2020-06-01 DIAGNOSIS — E785 Hyperlipidemia, unspecified: Secondary | ICD-10-CM | POA: Diagnosis not present

## 2020-06-01 DIAGNOSIS — I2510 Atherosclerotic heart disease of native coronary artery without angina pectoris: Secondary | ICD-10-CM | POA: Diagnosis not present

## 2020-06-01 DIAGNOSIS — M109 Gout, unspecified: Secondary | ICD-10-CM | POA: Diagnosis not present

## 2020-06-01 DIAGNOSIS — Z7982 Long term (current) use of aspirin: Secondary | ICD-10-CM | POA: Diagnosis not present

## 2020-06-01 DIAGNOSIS — M1991 Primary osteoarthritis, unspecified site: Secondary | ICD-10-CM | POA: Diagnosis not present

## 2020-06-01 DIAGNOSIS — Z7984 Long term (current) use of oral hypoglycemic drugs: Secondary | ICD-10-CM | POA: Diagnosis not present

## 2020-06-01 DIAGNOSIS — D631 Anemia in chronic kidney disease: Secondary | ICD-10-CM | POA: Diagnosis not present

## 2020-06-01 DIAGNOSIS — Z993 Dependence on wheelchair: Secondary | ICD-10-CM | POA: Diagnosis not present

## 2020-06-01 DIAGNOSIS — E1122 Type 2 diabetes mellitus with diabetic chronic kidney disease: Secondary | ICD-10-CM | POA: Diagnosis not present

## 2020-06-01 DIAGNOSIS — I130 Hypertensive heart and chronic kidney disease with heart failure and stage 1 through stage 4 chronic kidney disease, or unspecified chronic kidney disease: Secondary | ICD-10-CM | POA: Diagnosis not present

## 2020-06-01 DIAGNOSIS — K219 Gastro-esophageal reflux disease without esophagitis: Secondary | ICD-10-CM | POA: Diagnosis not present

## 2020-06-01 DIAGNOSIS — N183 Chronic kidney disease, stage 3 unspecified: Secondary | ICD-10-CM | POA: Diagnosis not present

## 2020-06-01 DIAGNOSIS — J449 Chronic obstructive pulmonary disease, unspecified: Secondary | ICD-10-CM | POA: Diagnosis not present

## 2020-06-01 DIAGNOSIS — I5023 Acute on chronic systolic (congestive) heart failure: Secondary | ICD-10-CM | POA: Diagnosis not present

## 2020-06-01 DIAGNOSIS — I509 Heart failure, unspecified: Secondary | ICD-10-CM | POA: Diagnosis not present

## 2020-06-01 DIAGNOSIS — Z9181 History of falling: Secondary | ICD-10-CM | POA: Diagnosis not present

## 2020-06-01 DIAGNOSIS — K5900 Constipation, unspecified: Secondary | ICD-10-CM | POA: Diagnosis not present

## 2020-06-03 ENCOUNTER — Telehealth: Payer: Self-pay | Admitting: Family Medicine

## 2020-06-03 DIAGNOSIS — I25119 Atherosclerotic heart disease of native coronary artery with unspecified angina pectoris: Secondary | ICD-10-CM

## 2020-06-03 DIAGNOSIS — M109 Gout, unspecified: Secondary | ICD-10-CM

## 2020-06-03 MED ORDER — ALLOPURINOL 300 MG PO TABS: 300.0000 mg | ORAL_TABLET | Freq: Two times a day (BID) | ORAL | 6 refills | Status: AC

## 2020-06-03 MED ORDER — ATORVASTATIN CALCIUM 80 MG PO TABS: 80.0000 mg | ORAL_TABLET | Freq: Every day | ORAL | 1 refills | Status: DC

## 2020-06-03 NOTE — Telephone Encounter (Signed)
Should be 300 mg twice daily. OK to send if refill needed. TY.

## 2020-06-03 NOTE — Telephone Encounter (Signed)
Called HHRN informed of PCP verbal ok 

## 2020-06-03 NOTE — Telephone Encounter (Signed)
Caller [facility] Call Back # [phone]  Patient d/c from hospital. Requesting verbal order for skilled nursing, also a evaluation for physical therapy.

## 2020-06-03 NOTE — Telephone Encounter (Signed)
From pharmacy refill request for Allopurinol 300 mg 1 tablet daily per discharge summary. Advise please

## 2020-06-03 NOTE — Addendum Note (Signed)
Addended by: Sharon Seller B on: 06/03/2020 12:35 PM   Modules accepted: Orders

## 2020-06-03 NOTE — Telephone Encounter (Signed)
Refill done.  

## 2020-06-04 DIAGNOSIS — K5900 Constipation, unspecified: Secondary | ICD-10-CM | POA: Diagnosis not present

## 2020-06-04 DIAGNOSIS — D631 Anemia in chronic kidney disease: Secondary | ICD-10-CM | POA: Diagnosis not present

## 2020-06-04 DIAGNOSIS — N183 Chronic kidney disease, stage 3 unspecified: Secondary | ICD-10-CM | POA: Diagnosis not present

## 2020-06-04 DIAGNOSIS — E1122 Type 2 diabetes mellitus with diabetic chronic kidney disease: Secondary | ICD-10-CM | POA: Diagnosis not present

## 2020-06-04 DIAGNOSIS — I130 Hypertensive heart and chronic kidney disease with heart failure and stage 1 through stage 4 chronic kidney disease, or unspecified chronic kidney disease: Secondary | ICD-10-CM | POA: Diagnosis not present

## 2020-06-04 DIAGNOSIS — I5023 Acute on chronic systolic (congestive) heart failure: Secondary | ICD-10-CM | POA: Diagnosis not present

## 2020-06-05 ENCOUNTER — Telehealth: Payer: Self-pay | Admitting: Family Medicine

## 2020-06-05 DIAGNOSIS — E1122 Type 2 diabetes mellitus with diabetic chronic kidney disease: Secondary | ICD-10-CM | POA: Diagnosis not present

## 2020-06-05 DIAGNOSIS — D631 Anemia in chronic kidney disease: Secondary | ICD-10-CM | POA: Diagnosis not present

## 2020-06-05 DIAGNOSIS — K5900 Constipation, unspecified: Secondary | ICD-10-CM | POA: Diagnosis not present

## 2020-06-05 DIAGNOSIS — N183 Chronic kidney disease, stage 3 unspecified: Secondary | ICD-10-CM | POA: Diagnosis not present

## 2020-06-05 DIAGNOSIS — I5023 Acute on chronic systolic (congestive) heart failure: Secondary | ICD-10-CM | POA: Diagnosis not present

## 2020-06-05 DIAGNOSIS — I130 Hypertensive heart and chronic kidney disease with heart failure and stage 1 through stage 4 chronic kidney disease, or unspecified chronic kidney disease: Secondary | ICD-10-CM | POA: Diagnosis not present

## 2020-06-05 NOTE — Telephone Encounter (Signed)
Called [facility] informed of PCP ok verbal ok.

## 2020-06-05 NOTE — Telephone Encounter (Signed)
Caller/Agency: Anderson Malta [facility])  Callback Number:[phone] Requesting OT/PT/Skilled Nursing/Social Work/Speech Therapy: PT  Frequency: 1 time a week for 8 week starting today.  Call back with verbal order.

## 2020-06-06 DIAGNOSIS — D631 Anemia in chronic kidney disease: Secondary | ICD-10-CM | POA: Diagnosis not present

## 2020-06-06 DIAGNOSIS — E1122 Type 2 diabetes mellitus with diabetic chronic kidney disease: Secondary | ICD-10-CM | POA: Diagnosis not present

## 2020-06-06 DIAGNOSIS — I5023 Acute on chronic systolic (congestive) heart failure: Secondary | ICD-10-CM | POA: Diagnosis not present

## 2020-06-06 DIAGNOSIS — I130 Hypertensive heart and chronic kidney disease with heart failure and stage 1 through stage 4 chronic kidney disease, or unspecified chronic kidney disease: Secondary | ICD-10-CM | POA: Diagnosis not present

## 2020-06-06 DIAGNOSIS — K5900 Constipation, unspecified: Secondary | ICD-10-CM | POA: Diagnosis not present

## 2020-06-06 DIAGNOSIS — N183 Chronic kidney disease, stage 3 unspecified: Secondary | ICD-10-CM | POA: Diagnosis not present

## 2020-06-07 DIAGNOSIS — I5023 Acute on chronic systolic (congestive) heart failure: Secondary | ICD-10-CM | POA: Diagnosis not present

## 2020-06-07 DIAGNOSIS — G629 Polyneuropathy, unspecified: Secondary | ICD-10-CM | POA: Diagnosis not present

## 2020-06-07 DIAGNOSIS — Z79899 Other long term (current) drug therapy: Secondary | ICD-10-CM | POA: Diagnosis not present

## 2020-06-07 DIAGNOSIS — N1832 Chronic kidney disease, stage 3b: Secondary | ICD-10-CM | POA: Diagnosis not present

## 2020-06-08 DIAGNOSIS — D631 Anemia in chronic kidney disease: Secondary | ICD-10-CM | POA: Diagnosis not present

## 2020-06-08 DIAGNOSIS — I5023 Acute on chronic systolic (congestive) heart failure: Secondary | ICD-10-CM | POA: Diagnosis not present

## 2020-06-08 DIAGNOSIS — E1122 Type 2 diabetes mellitus with diabetic chronic kidney disease: Secondary | ICD-10-CM | POA: Diagnosis not present

## 2020-06-08 DIAGNOSIS — N183 Chronic kidney disease, stage 3 unspecified: Secondary | ICD-10-CM | POA: Diagnosis not present

## 2020-06-08 DIAGNOSIS — K5900 Constipation, unspecified: Secondary | ICD-10-CM | POA: Diagnosis not present

## 2020-06-08 DIAGNOSIS — I130 Hypertensive heart and chronic kidney disease with heart failure and stage 1 through stage 4 chronic kidney disease, or unspecified chronic kidney disease: Secondary | ICD-10-CM | POA: Diagnosis not present

## 2020-06-09 DIAGNOSIS — N183 Chronic kidney disease, stage 3 unspecified: Secondary | ICD-10-CM | POA: Diagnosis not present

## 2020-06-09 DIAGNOSIS — I130 Hypertensive heart and chronic kidney disease with heart failure and stage 1 through stage 4 chronic kidney disease, or unspecified chronic kidney disease: Secondary | ICD-10-CM | POA: Diagnosis not present

## 2020-06-09 DIAGNOSIS — E1122 Type 2 diabetes mellitus with diabetic chronic kidney disease: Secondary | ICD-10-CM | POA: Diagnosis not present

## 2020-06-09 DIAGNOSIS — D631 Anemia in chronic kidney disease: Secondary | ICD-10-CM | POA: Diagnosis not present

## 2020-06-09 DIAGNOSIS — K5900 Constipation, unspecified: Secondary | ICD-10-CM | POA: Diagnosis not present

## 2020-06-09 DIAGNOSIS — I5023 Acute on chronic systolic (congestive) heart failure: Secondary | ICD-10-CM | POA: Diagnosis not present

## 2020-06-10 DIAGNOSIS — E1122 Type 2 diabetes mellitus with diabetic chronic kidney disease: Secondary | ICD-10-CM | POA: Diagnosis not present

## 2020-06-10 DIAGNOSIS — D631 Anemia in chronic kidney disease: Secondary | ICD-10-CM | POA: Diagnosis not present

## 2020-06-10 DIAGNOSIS — K5900 Constipation, unspecified: Secondary | ICD-10-CM | POA: Diagnosis not present

## 2020-06-10 DIAGNOSIS — N183 Chronic kidney disease, stage 3 unspecified: Secondary | ICD-10-CM | POA: Diagnosis not present

## 2020-06-10 DIAGNOSIS — I5023 Acute on chronic systolic (congestive) heart failure: Secondary | ICD-10-CM | POA: Diagnosis not present

## 2020-06-10 DIAGNOSIS — I130 Hypertensive heart and chronic kidney disease with heart failure and stage 1 through stage 4 chronic kidney disease, or unspecified chronic kidney disease: Secondary | ICD-10-CM | POA: Diagnosis not present

## 2020-06-11 DIAGNOSIS — I5023 Acute on chronic systolic (congestive) heart failure: Secondary | ICD-10-CM | POA: Diagnosis not present

## 2020-06-11 DIAGNOSIS — D631 Anemia in chronic kidney disease: Secondary | ICD-10-CM | POA: Diagnosis not present

## 2020-06-11 DIAGNOSIS — N183 Chronic kidney disease, stage 3 unspecified: Secondary | ICD-10-CM | POA: Diagnosis not present

## 2020-06-11 DIAGNOSIS — E1122 Type 2 diabetes mellitus with diabetic chronic kidney disease: Secondary | ICD-10-CM | POA: Diagnosis not present

## 2020-06-11 DIAGNOSIS — K5900 Constipation, unspecified: Secondary | ICD-10-CM | POA: Diagnosis not present

## 2020-06-11 DIAGNOSIS — I130 Hypertensive heart and chronic kidney disease with heart failure and stage 1 through stage 4 chronic kidney disease, or unspecified chronic kidney disease: Secondary | ICD-10-CM | POA: Diagnosis not present

## 2020-06-12 DIAGNOSIS — I517 Cardiomegaly: Secondary | ICD-10-CM | POA: Diagnosis not present

## 2020-06-12 DIAGNOSIS — I130 Hypertensive heart and chronic kidney disease with heart failure and stage 1 through stage 4 chronic kidney disease, or unspecified chronic kidney disease: Secondary | ICD-10-CM | POA: Diagnosis not present

## 2020-06-12 DIAGNOSIS — E1122 Type 2 diabetes mellitus with diabetic chronic kidney disease: Secondary | ICD-10-CM | POA: Diagnosis not present

## 2020-06-12 DIAGNOSIS — D631 Anemia in chronic kidney disease: Secondary | ICD-10-CM | POA: Diagnosis not present

## 2020-06-12 DIAGNOSIS — I110 Hypertensive heart disease with heart failure: Secondary | ICD-10-CM | POA: Diagnosis not present

## 2020-06-12 DIAGNOSIS — K5900 Constipation, unspecified: Secondary | ICD-10-CM | POA: Diagnosis not present

## 2020-06-12 DIAGNOSIS — R0602 Shortness of breath: Secondary | ICD-10-CM | POA: Diagnosis not present

## 2020-06-12 DIAGNOSIS — I5023 Acute on chronic systolic (congestive) heart failure: Secondary | ICD-10-CM | POA: Diagnosis not present

## 2020-06-12 DIAGNOSIS — R188 Other ascites: Secondary | ICD-10-CM | POA: Diagnosis not present

## 2020-06-12 DIAGNOSIS — N183 Chronic kidney disease, stage 3 unspecified: Secondary | ICD-10-CM | POA: Diagnosis not present

## 2020-06-12 DIAGNOSIS — Z87891 Personal history of nicotine dependence: Secondary | ICD-10-CM | POA: Diagnosis not present

## 2020-06-13 DIAGNOSIS — Z950 Presence of cardiac pacemaker: Secondary | ICD-10-CM | POA: Diagnosis not present

## 2020-06-21 ENCOUNTER — Telehealth: Payer: Self-pay | Admitting: Family Medicine

## 2020-06-21 ENCOUNTER — Inpatient Hospital Stay: Payer: Medicare Other | Admitting: Family Medicine

## 2020-06-21 DIAGNOSIS — M2550 Pain in unspecified joint: Secondary | ICD-10-CM | POA: Diagnosis not present

## 2020-06-21 DIAGNOSIS — R52 Pain, unspecified: Secondary | ICD-10-CM | POA: Diagnosis not present

## 2020-06-21 DIAGNOSIS — Z7401 Bed confinement status: Secondary | ICD-10-CM | POA: Diagnosis not present

## 2020-06-21 DIAGNOSIS — J9600 Acute respiratory failure, unspecified whether with hypoxia or hypercapnia: Secondary | ICD-10-CM | POA: Diagnosis not present

## 2020-06-21 NOTE — Telephone Encounter (Signed)
Anderson Malta- will you get this from the printer in my office please?

## 2020-06-21 NOTE — Telephone Encounter (Signed)
Due to significant number of no-shows, I would like to discharge this patient from our practice. Thank you. 06/21/20 04/26/20 12/26/19 12/18/19

## 2020-06-22 ENCOUNTER — Other Ambulatory Visit: Payer: Self-pay | Admitting: Family Medicine

## 2020-06-22 DIAGNOSIS — N183 Chronic kidney disease, stage 3 unspecified: Secondary | ICD-10-CM | POA: Diagnosis not present

## 2020-06-22 DIAGNOSIS — D631 Anemia in chronic kidney disease: Secondary | ICD-10-CM | POA: Diagnosis not present

## 2020-06-22 DIAGNOSIS — I130 Hypertensive heart and chronic kidney disease with heart failure and stage 1 through stage 4 chronic kidney disease, or unspecified chronic kidney disease: Secondary | ICD-10-CM | POA: Diagnosis not present

## 2020-06-22 DIAGNOSIS — E1122 Type 2 diabetes mellitus with diabetic chronic kidney disease: Secondary | ICD-10-CM | POA: Diagnosis not present

## 2020-06-22 DIAGNOSIS — K5900 Constipation, unspecified: Secondary | ICD-10-CM | POA: Diagnosis not present

## 2020-06-22 DIAGNOSIS — I5023 Acute on chronic systolic (congestive) heart failure: Secondary | ICD-10-CM | POA: Diagnosis not present

## 2020-06-23 DIAGNOSIS — I130 Hypertensive heart and chronic kidney disease with heart failure and stage 1 through stage 4 chronic kidney disease, or unspecified chronic kidney disease: Secondary | ICD-10-CM | POA: Diagnosis not present

## 2020-06-23 DIAGNOSIS — K5900 Constipation, unspecified: Secondary | ICD-10-CM | POA: Diagnosis not present

## 2020-06-23 DIAGNOSIS — D631 Anemia in chronic kidney disease: Secondary | ICD-10-CM | POA: Diagnosis not present

## 2020-06-23 DIAGNOSIS — I5023 Acute on chronic systolic (congestive) heart failure: Secondary | ICD-10-CM | POA: Diagnosis not present

## 2020-06-23 DIAGNOSIS — E1122 Type 2 diabetes mellitus with diabetic chronic kidney disease: Secondary | ICD-10-CM | POA: Diagnosis not present

## 2020-06-23 DIAGNOSIS — N183 Chronic kidney disease, stage 3 unspecified: Secondary | ICD-10-CM | POA: Diagnosis not present

## 2020-06-24 ENCOUNTER — Telehealth: Payer: Self-pay | Admitting: Family Medicine

## 2020-06-24 DIAGNOSIS — N183 Chronic kidney disease, stage 3 unspecified: Secondary | ICD-10-CM | POA: Diagnosis not present

## 2020-06-24 DIAGNOSIS — I130 Hypertensive heart and chronic kidney disease with heart failure and stage 1 through stage 4 chronic kidney disease, or unspecified chronic kidney disease: Secondary | ICD-10-CM | POA: Diagnosis not present

## 2020-06-24 DIAGNOSIS — I5023 Acute on chronic systolic (congestive) heart failure: Secondary | ICD-10-CM | POA: Diagnosis not present

## 2020-06-24 DIAGNOSIS — K5900 Constipation, unspecified: Secondary | ICD-10-CM | POA: Diagnosis not present

## 2020-06-24 DIAGNOSIS — E1122 Type 2 diabetes mellitus with diabetic chronic kidney disease: Secondary | ICD-10-CM | POA: Diagnosis not present

## 2020-06-24 DIAGNOSIS — D631 Anemia in chronic kidney disease: Secondary | ICD-10-CM | POA: Diagnosis not present

## 2020-06-24 NOTE — Telephone Encounter (Signed)
Caller: TELENA ([place]) Call back phone number: [phone]  Need verbal order for:  OT  1 week for 6weeks

## 2020-06-24 NOTE — Telephone Encounter (Signed)
Advise on this order please

## 2020-06-25 DIAGNOSIS — I5023 Acute on chronic systolic (congestive) heart failure: Secondary | ICD-10-CM | POA: Diagnosis not present

## 2020-06-25 DIAGNOSIS — I130 Hypertensive heart and chronic kidney disease with heart failure and stage 1 through stage 4 chronic kidney disease, or unspecified chronic kidney disease: Secondary | ICD-10-CM | POA: Diagnosis not present

## 2020-06-25 DIAGNOSIS — E1122 Type 2 diabetes mellitus with diabetic chronic kidney disease: Secondary | ICD-10-CM | POA: Diagnosis not present

## 2020-06-25 DIAGNOSIS — K5900 Constipation, unspecified: Secondary | ICD-10-CM | POA: Diagnosis not present

## 2020-06-25 DIAGNOSIS — N183 Chronic kidney disease, stage 3 unspecified: Secondary | ICD-10-CM | POA: Diagnosis not present

## 2020-06-25 DIAGNOSIS — D631 Anemia in chronic kidney disease: Secondary | ICD-10-CM | POA: Diagnosis not present

## 2020-06-25 NOTE — Telephone Encounter (Signed)
Called [facility] informed verbal ok per PCP

## 2020-06-25 NOTE — Telephone Encounter (Signed)
OK 

## 2020-06-26 ENCOUNTER — Telehealth: Payer: Self-pay | Admitting: Family Medicine

## 2020-06-26 DIAGNOSIS — I130 Hypertensive heart and chronic kidney disease with heart failure and stage 1 through stage 4 chronic kidney disease, or unspecified chronic kidney disease: Secondary | ICD-10-CM | POA: Diagnosis not present

## 2020-06-26 DIAGNOSIS — D631 Anemia in chronic kidney disease: Secondary | ICD-10-CM | POA: Diagnosis not present

## 2020-06-26 DIAGNOSIS — N183 Chronic kidney disease, stage 3 unspecified: Secondary | ICD-10-CM | POA: Diagnosis not present

## 2020-06-26 DIAGNOSIS — E1122 Type 2 diabetes mellitus with diabetic chronic kidney disease: Secondary | ICD-10-CM | POA: Diagnosis not present

## 2020-06-26 DIAGNOSIS — I5023 Acute on chronic systolic (congestive) heart failure: Secondary | ICD-10-CM | POA: Diagnosis not present

## 2020-06-26 DIAGNOSIS — K5900 Constipation, unspecified: Secondary | ICD-10-CM | POA: Diagnosis not present

## 2020-06-26 MED ORDER — COLCHICINE 0.6 MG PO TABS: ORAL_TABLET | ORAL | 1 refills | Status: DC

## 2020-06-26 MED ORDER — COLCHICINE 0.6 MG PO TABS: ORAL_TABLET | ORAL | 0 refills | Status: AC

## 2020-06-26 NOTE — Telephone Encounter (Signed)
Caller: Jonty Call back phone number: [phone]  Patient is experiencing left big toe pain, according to the patient is a gout flare up. Patient states colchine help in the past and is wondering if a prescription could be called.   Please adviaise

## 2020-06-26 NOTE — Telephone Encounter (Signed)
Caller: [facility]) Call back phone number: [phone]  Patrick Stewart is calling to inform you patient had a fall today with no injury.

## 2020-06-26 NOTE — Telephone Encounter (Signed)
[facility] informed of PCP verbal ok

## 2020-06-26 NOTE — Telephone Encounter (Signed)
Caller: Anderson [facility]) Call back phone number: [phone]  Verbal order for PT (resuming PT)  1 time a week for 5 weeks - starting today 06/26/20

## 2020-06-26 NOTE — Addendum Note (Signed)
Addended by: Sharon Seller B on: 06/26/2020 02:48 PM   Modules accepted: Orders

## 2020-06-26 NOTE — Telephone Encounter (Signed)
He should have a prescription? [facility]? OK to send in again if needed. Ty.

## 2020-06-26 NOTE — Telephone Encounter (Signed)
Sent in and called the patient informed sent in.

## 2020-06-27 ENCOUNTER — Other Ambulatory Visit: Payer: Self-pay | Admitting: Family Medicine

## 2020-06-27 DIAGNOSIS — I5023 Acute on chronic systolic (congestive) heart failure: Secondary | ICD-10-CM | POA: Diagnosis not present

## 2020-06-27 DIAGNOSIS — I130 Hypertensive heart and chronic kidney disease with heart failure and stage 1 through stage 4 chronic kidney disease, or unspecified chronic kidney disease: Secondary | ICD-10-CM | POA: Diagnosis not present

## 2020-06-27 DIAGNOSIS — K5900 Constipation, unspecified: Secondary | ICD-10-CM | POA: Diagnosis not present

## 2020-06-27 DIAGNOSIS — D631 Anemia in chronic kidney disease: Secondary | ICD-10-CM | POA: Diagnosis not present

## 2020-06-27 DIAGNOSIS — N183 Chronic kidney disease, stage 3 unspecified: Secondary | ICD-10-CM | POA: Diagnosis not present

## 2020-06-27 DIAGNOSIS — I25119 Atherosclerotic heart disease of native coronary artery with unspecified angina pectoris: Secondary | ICD-10-CM

## 2020-06-27 DIAGNOSIS — E1122 Type 2 diabetes mellitus with diabetic chronic kidney disease: Secondary | ICD-10-CM | POA: Diagnosis not present

## 2020-06-28 DIAGNOSIS — D631 Anemia in chronic kidney disease: Secondary | ICD-10-CM | POA: Diagnosis not present

## 2020-06-28 DIAGNOSIS — N183 Chronic kidney disease, stage 3 unspecified: Secondary | ICD-10-CM | POA: Diagnosis not present

## 2020-06-28 DIAGNOSIS — K5900 Constipation, unspecified: Secondary | ICD-10-CM | POA: Diagnosis not present

## 2020-06-28 DIAGNOSIS — I130 Hypertensive heart and chronic kidney disease with heart failure and stage 1 through stage 4 chronic kidney disease, or unspecified chronic kidney disease: Secondary | ICD-10-CM | POA: Diagnosis not present

## 2020-06-28 DIAGNOSIS — I5023 Acute on chronic systolic (congestive) heart failure: Secondary | ICD-10-CM | POA: Diagnosis not present

## 2020-06-28 DIAGNOSIS — E1122 Type 2 diabetes mellitus with diabetic chronic kidney disease: Secondary | ICD-10-CM | POA: Diagnosis not present

## 2020-06-29 DIAGNOSIS — N183 Chronic kidney disease, stage 3 unspecified: Secondary | ICD-10-CM | POA: Diagnosis not present

## 2020-06-29 DIAGNOSIS — I130 Hypertensive heart and chronic kidney disease with heart failure and stage 1 through stage 4 chronic kidney disease, or unspecified chronic kidney disease: Secondary | ICD-10-CM | POA: Diagnosis not present

## 2020-06-29 DIAGNOSIS — D631 Anemia in chronic kidney disease: Secondary | ICD-10-CM | POA: Diagnosis not present

## 2020-06-29 DIAGNOSIS — E1122 Type 2 diabetes mellitus with diabetic chronic kidney disease: Secondary | ICD-10-CM | POA: Diagnosis not present

## 2020-06-29 DIAGNOSIS — I5023 Acute on chronic systolic (congestive) heart failure: Secondary | ICD-10-CM | POA: Diagnosis not present

## 2020-06-29 DIAGNOSIS — K5900 Constipation, unspecified: Secondary | ICD-10-CM | POA: Diagnosis not present

## 2020-06-30 DIAGNOSIS — I130 Hypertensive heart and chronic kidney disease with heart failure and stage 1 through stage 4 chronic kidney disease, or unspecified chronic kidney disease: Secondary | ICD-10-CM | POA: Diagnosis not present

## 2020-06-30 DIAGNOSIS — D631 Anemia in chronic kidney disease: Secondary | ICD-10-CM | POA: Diagnosis not present

## 2020-06-30 DIAGNOSIS — E1122 Type 2 diabetes mellitus with diabetic chronic kidney disease: Secondary | ICD-10-CM | POA: Diagnosis not present

## 2020-06-30 DIAGNOSIS — I5023 Acute on chronic systolic (congestive) heart failure: Secondary | ICD-10-CM | POA: Diagnosis not present

## 2020-06-30 DIAGNOSIS — N183 Chronic kidney disease, stage 3 unspecified: Secondary | ICD-10-CM | POA: Diagnosis not present

## 2020-06-30 DIAGNOSIS — K5900 Constipation, unspecified: Secondary | ICD-10-CM | POA: Diagnosis not present

## 2020-07-01 DIAGNOSIS — E785 Hyperlipidemia, unspecified: Secondary | ICD-10-CM | POA: Diagnosis not present

## 2020-07-01 DIAGNOSIS — Z9181 History of falling: Secondary | ICD-10-CM | POA: Diagnosis not present

## 2020-07-01 DIAGNOSIS — I130 Hypertensive heart and chronic kidney disease with heart failure and stage 1 through stage 4 chronic kidney disease, or unspecified chronic kidney disease: Secondary | ICD-10-CM | POA: Diagnosis not present

## 2020-07-01 DIAGNOSIS — D631 Anemia in chronic kidney disease: Secondary | ICD-10-CM | POA: Diagnosis not present

## 2020-07-01 DIAGNOSIS — N183 Chronic kidney disease, stage 3 unspecified: Secondary | ICD-10-CM | POA: Diagnosis not present

## 2020-07-01 DIAGNOSIS — E1122 Type 2 diabetes mellitus with diabetic chronic kidney disease: Secondary | ICD-10-CM | POA: Diagnosis not present

## 2020-07-01 DIAGNOSIS — M109 Gout, unspecified: Secondary | ICD-10-CM | POA: Diagnosis not present

## 2020-07-01 DIAGNOSIS — Z87891 Personal history of nicotine dependence: Secondary | ICD-10-CM | POA: Diagnosis not present

## 2020-07-01 DIAGNOSIS — Z951 Presence of aortocoronary bypass graft: Secondary | ICD-10-CM | POA: Diagnosis not present

## 2020-07-01 DIAGNOSIS — I2510 Atherosclerotic heart disease of native coronary artery without angina pectoris: Secondary | ICD-10-CM | POA: Diagnosis not present

## 2020-07-01 DIAGNOSIS — J449 Chronic obstructive pulmonary disease, unspecified: Secondary | ICD-10-CM | POA: Diagnosis not present

## 2020-07-01 DIAGNOSIS — K219 Gastro-esophageal reflux disease without esophagitis: Secondary | ICD-10-CM | POA: Diagnosis not present

## 2020-07-01 DIAGNOSIS — M1991 Primary osteoarthritis, unspecified site: Secondary | ICD-10-CM | POA: Diagnosis not present

## 2020-07-01 DIAGNOSIS — I5023 Acute on chronic systolic (congestive) heart failure: Secondary | ICD-10-CM | POA: Diagnosis not present

## 2020-07-01 DIAGNOSIS — Z7984 Long term (current) use of oral hypoglycemic drugs: Secondary | ICD-10-CM | POA: Diagnosis not present

## 2020-07-01 DIAGNOSIS — K5900 Constipation, unspecified: Secondary | ICD-10-CM | POA: Diagnosis not present

## 2020-07-02 ENCOUNTER — Telehealth: Payer: Self-pay

## 2020-07-02 ENCOUNTER — Telehealth: Payer: Self-pay | Admitting: Family Medicine

## 2020-07-02 DIAGNOSIS — E1122 Type 2 diabetes mellitus with diabetic chronic kidney disease: Secondary | ICD-10-CM | POA: Diagnosis not present

## 2020-07-02 DIAGNOSIS — N183 Chronic kidney disease, stage 3 unspecified: Secondary | ICD-10-CM | POA: Diagnosis not present

## 2020-07-02 DIAGNOSIS — I5023 Acute on chronic systolic (congestive) heart failure: Secondary | ICD-10-CM | POA: Diagnosis not present

## 2020-07-02 DIAGNOSIS — D631 Anemia in chronic kidney disease: Secondary | ICD-10-CM | POA: Diagnosis not present

## 2020-07-02 DIAGNOSIS — J449 Chronic obstructive pulmonary disease, unspecified: Secondary | ICD-10-CM | POA: Diagnosis not present

## 2020-07-02 DIAGNOSIS — I130 Hypertensive heart and chronic kidney disease with heart failure and stage 1 through stage 4 chronic kidney disease, or unspecified chronic kidney disease: Secondary | ICD-10-CM | POA: Diagnosis not present

## 2020-07-02 NOTE — Telephone Encounter (Signed)
Being admitted explains 1, maybe 2 no show dates, but he still easily meets criteria for d/c, no need to reverse decision.

## 2020-07-02 NOTE — Telephone Encounter (Signed)
Looks like in care everywhere he was admitted to [facility] on 06/12/2020 and discharged on 06/21/2020. He was admitted on 04/16/2020 and discharged on 04/22/2020. Should not affect the 04/26/2020 appt. Unsure of the 12/26/19 date as was admitted on 12/22/2019 but cannot see any other notes currently regarding that admission. 12/17/20 Dr. Nani Ravens said still stands/ and the 06/15/2020 was a no show, so he stated at least 3 No shows in 12 month period.Marland Kitchen

## 2020-07-02 NOTE — Telephone Encounter (Signed)
Caller : [facility]  Call Back # [phone]  Per Rhae Lerner, Patient states he received a letter stating that he was  dismissed from our practice due to NO Shows, on the dates addressed in the letter patient received are the dates in which patient was hospitalized. Patient would like to know if decision to dismissed can be over turned.    Please Advise

## 2020-07-02 NOTE — Telephone Encounter (Signed)
Shirlean Mylar - can you verify that the below message is accurate please? I cannot access Care Everywhere. If so, we will need to reverse dismissal.

## 2020-07-02 NOTE — Telephone Encounter (Signed)
Pt's Spouse, Hassan Rowan, called today wanting to make a follow up appointment with provider from where patient was in the hospital.  I made sure they had received the dismissal letter from provider for missed appointments and Hassan Rowan was referring to the letter during the conversation.  She stated patient had been to sick to come in for the appointments.  I reiterated Dr. Nani Ravens would be available only for acute care for 30 days from the date of the letter, which did not include hospital follow ups.  She stated she was needing a letter for her job to be out of work to care for patient.

## 2020-07-03 DIAGNOSIS — I5023 Acute on chronic systolic (congestive) heart failure: Secondary | ICD-10-CM | POA: Diagnosis not present

## 2020-07-03 DIAGNOSIS — D631 Anemia in chronic kidney disease: Secondary | ICD-10-CM | POA: Diagnosis not present

## 2020-07-03 DIAGNOSIS — J449 Chronic obstructive pulmonary disease, unspecified: Secondary | ICD-10-CM | POA: Diagnosis not present

## 2020-07-03 DIAGNOSIS — I130 Hypertensive heart and chronic kidney disease with heart failure and stage 1 through stage 4 chronic kidney disease, or unspecified chronic kidney disease: Secondary | ICD-10-CM | POA: Diagnosis not present

## 2020-07-03 DIAGNOSIS — E1122 Type 2 diabetes mellitus with diabetic chronic kidney disease: Secondary | ICD-10-CM | POA: Diagnosis not present

## 2020-07-03 DIAGNOSIS — N183 Chronic kidney disease, stage 3 unspecified: Secondary | ICD-10-CM | POA: Diagnosis not present

## 2020-07-04 DIAGNOSIS — D631 Anemia in chronic kidney disease: Secondary | ICD-10-CM | POA: Diagnosis not present

## 2020-07-04 DIAGNOSIS — J449 Chronic obstructive pulmonary disease, unspecified: Secondary | ICD-10-CM | POA: Diagnosis not present

## 2020-07-04 DIAGNOSIS — E1122 Type 2 diabetes mellitus with diabetic chronic kidney disease: Secondary | ICD-10-CM | POA: Diagnosis not present

## 2020-07-04 DIAGNOSIS — I5023 Acute on chronic systolic (congestive) heart failure: Secondary | ICD-10-CM | POA: Diagnosis not present

## 2020-07-04 DIAGNOSIS — I130 Hypertensive heart and chronic kidney disease with heart failure and stage 1 through stage 4 chronic kidney disease, or unspecified chronic kidney disease: Secondary | ICD-10-CM | POA: Diagnosis not present

## 2020-07-04 DIAGNOSIS — N183 Chronic kidney disease, stage 3 unspecified: Secondary | ICD-10-CM | POA: Diagnosis not present

## 2020-07-05 ENCOUNTER — Telehealth: Payer: Self-pay | Admitting: Family Medicine

## 2020-07-05 DIAGNOSIS — D631 Anemia in chronic kidney disease: Secondary | ICD-10-CM | POA: Diagnosis not present

## 2020-07-05 DIAGNOSIS — E1122 Type 2 diabetes mellitus with diabetic chronic kidney disease: Secondary | ICD-10-CM | POA: Diagnosis not present

## 2020-07-05 DIAGNOSIS — I130 Hypertensive heart and chronic kidney disease with heart failure and stage 1 through stage 4 chronic kidney disease, or unspecified chronic kidney disease: Secondary | ICD-10-CM | POA: Diagnosis not present

## 2020-07-05 DIAGNOSIS — J449 Chronic obstructive pulmonary disease, unspecified: Secondary | ICD-10-CM | POA: Diagnosis not present

## 2020-07-05 DIAGNOSIS — I5023 Acute on chronic systolic (congestive) heart failure: Secondary | ICD-10-CM | POA: Diagnosis not present

## 2020-07-05 DIAGNOSIS — N183 Chronic kidney disease, stage 3 unspecified: Secondary | ICD-10-CM | POA: Diagnosis not present

## 2020-07-05 NOTE — Telephone Encounter (Signed)
[facility] nurse called. After reviewing previous msg I called Kaleigh back lvm explaining that Dr. Nani Ravens decided not to overturn his decision.

## 2020-07-06 DIAGNOSIS — N183 Chronic kidney disease, stage 3 unspecified: Secondary | ICD-10-CM | POA: Diagnosis not present

## 2020-07-06 DIAGNOSIS — E1122 Type 2 diabetes mellitus with diabetic chronic kidney disease: Secondary | ICD-10-CM | POA: Diagnosis not present

## 2020-07-06 DIAGNOSIS — I130 Hypertensive heart and chronic kidney disease with heart failure and stage 1 through stage 4 chronic kidney disease, or unspecified chronic kidney disease: Secondary | ICD-10-CM | POA: Diagnosis not present

## 2020-07-06 DIAGNOSIS — I5023 Acute on chronic systolic (congestive) heart failure: Secondary | ICD-10-CM | POA: Diagnosis not present

## 2020-07-06 DIAGNOSIS — J449 Chronic obstructive pulmonary disease, unspecified: Secondary | ICD-10-CM | POA: Diagnosis not present

## 2020-07-06 DIAGNOSIS — D631 Anemia in chronic kidney disease: Secondary | ICD-10-CM | POA: Diagnosis not present

## 2020-07-08 DIAGNOSIS — E1122 Type 2 diabetes mellitus with diabetic chronic kidney disease: Secondary | ICD-10-CM | POA: Diagnosis not present

## 2020-07-08 DIAGNOSIS — I5023 Acute on chronic systolic (congestive) heart failure: Secondary | ICD-10-CM | POA: Diagnosis not present

## 2020-07-08 DIAGNOSIS — N183 Chronic kidney disease, stage 3 unspecified: Secondary | ICD-10-CM | POA: Diagnosis not present

## 2020-07-08 DIAGNOSIS — D631 Anemia in chronic kidney disease: Secondary | ICD-10-CM | POA: Diagnosis not present

## 2020-07-08 DIAGNOSIS — I130 Hypertensive heart and chronic kidney disease with heart failure and stage 1 through stage 4 chronic kidney disease, or unspecified chronic kidney disease: Secondary | ICD-10-CM | POA: Diagnosis not present

## 2020-07-08 DIAGNOSIS — J449 Chronic obstructive pulmonary disease, unspecified: Secondary | ICD-10-CM | POA: Diagnosis not present

## 2020-07-09 DIAGNOSIS — E1122 Type 2 diabetes mellitus with diabetic chronic kidney disease: Secondary | ICD-10-CM | POA: Diagnosis not present

## 2020-07-09 DIAGNOSIS — I5023 Acute on chronic systolic (congestive) heart failure: Secondary | ICD-10-CM | POA: Diagnosis not present

## 2020-07-09 DIAGNOSIS — J449 Chronic obstructive pulmonary disease, unspecified: Secondary | ICD-10-CM | POA: Diagnosis not present

## 2020-07-09 DIAGNOSIS — N183 Chronic kidney disease, stage 3 unspecified: Secondary | ICD-10-CM | POA: Diagnosis not present

## 2020-07-09 DIAGNOSIS — I130 Hypertensive heart and chronic kidney disease with heart failure and stage 1 through stage 4 chronic kidney disease, or unspecified chronic kidney disease: Secondary | ICD-10-CM | POA: Diagnosis not present

## 2020-07-09 DIAGNOSIS — D631 Anemia in chronic kidney disease: Secondary | ICD-10-CM | POA: Diagnosis not present

## 2020-07-10 DIAGNOSIS — I130 Hypertensive heart and chronic kidney disease with heart failure and stage 1 through stage 4 chronic kidney disease, or unspecified chronic kidney disease: Secondary | ICD-10-CM | POA: Diagnosis not present

## 2020-07-10 DIAGNOSIS — J449 Chronic obstructive pulmonary disease, unspecified: Secondary | ICD-10-CM | POA: Diagnosis not present

## 2020-07-10 DIAGNOSIS — N183 Chronic kidney disease, stage 3 unspecified: Secondary | ICD-10-CM | POA: Diagnosis not present

## 2020-07-10 DIAGNOSIS — E1122 Type 2 diabetes mellitus with diabetic chronic kidney disease: Secondary | ICD-10-CM | POA: Diagnosis not present

## 2020-07-10 DIAGNOSIS — I5023 Acute on chronic systolic (congestive) heart failure: Secondary | ICD-10-CM | POA: Diagnosis not present

## 2020-07-10 DIAGNOSIS — D631 Anemia in chronic kidney disease: Secondary | ICD-10-CM | POA: Diagnosis not present

## 2020-07-11 DIAGNOSIS — E1122 Type 2 diabetes mellitus with diabetic chronic kidney disease: Secondary | ICD-10-CM | POA: Diagnosis not present

## 2020-07-11 DIAGNOSIS — I5023 Acute on chronic systolic (congestive) heart failure: Secondary | ICD-10-CM | POA: Diagnosis not present

## 2020-07-11 DIAGNOSIS — I130 Hypertensive heart and chronic kidney disease with heart failure and stage 1 through stage 4 chronic kidney disease, or unspecified chronic kidney disease: Secondary | ICD-10-CM | POA: Diagnosis not present

## 2020-07-11 DIAGNOSIS — J449 Chronic obstructive pulmonary disease, unspecified: Secondary | ICD-10-CM | POA: Diagnosis not present

## 2020-07-11 DIAGNOSIS — N183 Chronic kidney disease, stage 3 unspecified: Secondary | ICD-10-CM | POA: Diagnosis not present

## 2020-07-11 DIAGNOSIS — D631 Anemia in chronic kidney disease: Secondary | ICD-10-CM | POA: Diagnosis not present

## 2020-07-12 DIAGNOSIS — N183 Chronic kidney disease, stage 3 unspecified: Secondary | ICD-10-CM | POA: Diagnosis not present

## 2020-07-12 DIAGNOSIS — I5023 Acute on chronic systolic (congestive) heart failure: Secondary | ICD-10-CM | POA: Diagnosis not present

## 2020-07-12 DIAGNOSIS — J449 Chronic obstructive pulmonary disease, unspecified: Secondary | ICD-10-CM | POA: Diagnosis not present

## 2020-07-12 DIAGNOSIS — E1122 Type 2 diabetes mellitus with diabetic chronic kidney disease: Secondary | ICD-10-CM | POA: Diagnosis not present

## 2020-07-12 DIAGNOSIS — D631 Anemia in chronic kidney disease: Secondary | ICD-10-CM | POA: Diagnosis not present

## 2020-07-12 DIAGNOSIS — I130 Hypertensive heart and chronic kidney disease with heart failure and stage 1 through stage 4 chronic kidney disease, or unspecified chronic kidney disease: Secondary | ICD-10-CM | POA: Diagnosis not present

## 2020-07-15 DIAGNOSIS — E1122 Type 2 diabetes mellitus with diabetic chronic kidney disease: Secondary | ICD-10-CM | POA: Diagnosis not present

## 2020-07-15 DIAGNOSIS — N183 Chronic kidney disease, stage 3 unspecified: Secondary | ICD-10-CM | POA: Diagnosis not present

## 2020-07-15 DIAGNOSIS — J449 Chronic obstructive pulmonary disease, unspecified: Secondary | ICD-10-CM | POA: Diagnosis not present

## 2020-07-15 DIAGNOSIS — D631 Anemia in chronic kidney disease: Secondary | ICD-10-CM | POA: Diagnosis not present

## 2020-07-15 DIAGNOSIS — I5023 Acute on chronic systolic (congestive) heart failure: Secondary | ICD-10-CM | POA: Diagnosis not present

## 2020-07-15 DIAGNOSIS — I130 Hypertensive heart and chronic kidney disease with heart failure and stage 1 through stage 4 chronic kidney disease, or unspecified chronic kidney disease: Secondary | ICD-10-CM | POA: Diagnosis not present

## 2020-07-17 DIAGNOSIS — D631 Anemia in chronic kidney disease: Secondary | ICD-10-CM | POA: Diagnosis not present

## 2020-07-17 DIAGNOSIS — N183 Chronic kidney disease, stage 3 unspecified: Secondary | ICD-10-CM | POA: Diagnosis not present

## 2020-07-17 DIAGNOSIS — J449 Chronic obstructive pulmonary disease, unspecified: Secondary | ICD-10-CM | POA: Diagnosis not present

## 2020-07-17 DIAGNOSIS — I5023 Acute on chronic systolic (congestive) heart failure: Secondary | ICD-10-CM | POA: Diagnosis not present

## 2020-07-17 DIAGNOSIS — E1122 Type 2 diabetes mellitus with diabetic chronic kidney disease: Secondary | ICD-10-CM | POA: Diagnosis not present

## 2020-07-17 DIAGNOSIS — I130 Hypertensive heart and chronic kidney disease with heart failure and stage 1 through stage 4 chronic kidney disease, or unspecified chronic kidney disease: Secondary | ICD-10-CM | POA: Diagnosis not present

## 2020-07-19 DIAGNOSIS — E1122 Type 2 diabetes mellitus with diabetic chronic kidney disease: Secondary | ICD-10-CM | POA: Diagnosis not present

## 2020-07-19 DIAGNOSIS — D631 Anemia in chronic kidney disease: Secondary | ICD-10-CM | POA: Diagnosis not present

## 2020-07-19 DIAGNOSIS — N183 Chronic kidney disease, stage 3 unspecified: Secondary | ICD-10-CM | POA: Diagnosis not present

## 2020-07-19 DIAGNOSIS — I5023 Acute on chronic systolic (congestive) heart failure: Secondary | ICD-10-CM | POA: Diagnosis not present

## 2020-07-19 DIAGNOSIS — J449 Chronic obstructive pulmonary disease, unspecified: Secondary | ICD-10-CM | POA: Diagnosis not present

## 2020-07-19 DIAGNOSIS — I130 Hypertensive heart and chronic kidney disease with heart failure and stage 1 through stage 4 chronic kidney disease, or unspecified chronic kidney disease: Secondary | ICD-10-CM | POA: Diagnosis not present

## 2020-07-20 ENCOUNTER — Other Ambulatory Visit: Payer: Self-pay | Admitting: Family Medicine

## 2020-07-20 DIAGNOSIS — I25119 Atherosclerotic heart disease of native coronary artery with unspecified angina pectoris: Secondary | ICD-10-CM

## 2020-07-21 ENCOUNTER — Other Ambulatory Visit: Payer: Self-pay | Admitting: Family Medicine

## 2020-07-22 DIAGNOSIS — E1122 Type 2 diabetes mellitus with diabetic chronic kidney disease: Secondary | ICD-10-CM | POA: Diagnosis not present

## 2020-07-22 DIAGNOSIS — N183 Chronic kidney disease, stage 3 unspecified: Secondary | ICD-10-CM | POA: Diagnosis not present

## 2020-07-22 DIAGNOSIS — J449 Chronic obstructive pulmonary disease, unspecified: Secondary | ICD-10-CM | POA: Diagnosis not present

## 2020-07-22 DIAGNOSIS — D631 Anemia in chronic kidney disease: Secondary | ICD-10-CM | POA: Diagnosis not present

## 2020-07-22 DIAGNOSIS — I130 Hypertensive heart and chronic kidney disease with heart failure and stage 1 through stage 4 chronic kidney disease, or unspecified chronic kidney disease: Secondary | ICD-10-CM | POA: Diagnosis not present

## 2020-07-22 DIAGNOSIS — I5023 Acute on chronic systolic (congestive) heart failure: Secondary | ICD-10-CM | POA: Diagnosis not present

## 2020-07-23 DIAGNOSIS — E1122 Type 2 diabetes mellitus with diabetic chronic kidney disease: Secondary | ICD-10-CM | POA: Diagnosis not present

## 2020-07-23 DIAGNOSIS — I130 Hypertensive heart and chronic kidney disease with heart failure and stage 1 through stage 4 chronic kidney disease, or unspecified chronic kidney disease: Secondary | ICD-10-CM | POA: Diagnosis not present

## 2020-07-23 DIAGNOSIS — D631 Anemia in chronic kidney disease: Secondary | ICD-10-CM | POA: Diagnosis not present

## 2020-07-23 DIAGNOSIS — J449 Chronic obstructive pulmonary disease, unspecified: Secondary | ICD-10-CM | POA: Diagnosis not present

## 2020-07-23 DIAGNOSIS — N183 Chronic kidney disease, stage 3 unspecified: Secondary | ICD-10-CM | POA: Diagnosis not present

## 2020-07-23 DIAGNOSIS — I5023 Acute on chronic systolic (congestive) heart failure: Secondary | ICD-10-CM | POA: Diagnosis not present

## 2020-07-24 DIAGNOSIS — I130 Hypertensive heart and chronic kidney disease with heart failure and stage 1 through stage 4 chronic kidney disease, or unspecified chronic kidney disease: Secondary | ICD-10-CM | POA: Diagnosis not present

## 2020-07-24 DIAGNOSIS — N183 Chronic kidney disease, stage 3 unspecified: Secondary | ICD-10-CM | POA: Diagnosis not present

## 2020-07-24 DIAGNOSIS — E1122 Type 2 diabetes mellitus with diabetic chronic kidney disease: Secondary | ICD-10-CM | POA: Diagnosis not present

## 2020-07-24 DIAGNOSIS — D631 Anemia in chronic kidney disease: Secondary | ICD-10-CM | POA: Diagnosis not present

## 2020-07-24 DIAGNOSIS — I5023 Acute on chronic systolic (congestive) heart failure: Secondary | ICD-10-CM | POA: Diagnosis not present

## 2020-07-24 DIAGNOSIS — J449 Chronic obstructive pulmonary disease, unspecified: Secondary | ICD-10-CM | POA: Diagnosis not present

## 2020-07-26 DIAGNOSIS — I509 Heart failure, unspecified: Secondary | ICD-10-CM | POA: Diagnosis not present

## 2020-07-26 DIAGNOSIS — I110 Hypertensive heart disease with heart failure: Secondary | ICD-10-CM | POA: Diagnosis not present

## 2020-07-26 DIAGNOSIS — D61818 Other pancytopenia: Secondary | ICD-10-CM | POA: Diagnosis not present

## 2020-07-26 DIAGNOSIS — N289 Disorder of kidney and ureter, unspecified: Secondary | ICD-10-CM | POA: Diagnosis not present

## 2020-07-26 DIAGNOSIS — R0789 Other chest pain: Secondary | ICD-10-CM | POA: Diagnosis not present

## 2020-07-26 DIAGNOSIS — I2510 Atherosclerotic heart disease of native coronary artery without angina pectoris: Secondary | ICD-10-CM | POA: Diagnosis not present

## 2020-07-26 DIAGNOSIS — D649 Anemia, unspecified: Secondary | ICD-10-CM | POA: Diagnosis not present

## 2020-07-26 DIAGNOSIS — I252 Old myocardial infarction: Secondary | ICD-10-CM | POA: Diagnosis not present

## 2020-07-26 DIAGNOSIS — Z8616 Personal history of COVID-19: Secondary | ICD-10-CM | POA: Diagnosis not present

## 2020-07-26 DIAGNOSIS — I493 Ventricular premature depolarization: Secondary | ICD-10-CM | POA: Diagnosis not present

## 2020-07-26 DIAGNOSIS — R079 Chest pain, unspecified: Secondary | ICD-10-CM | POA: Diagnosis not present

## 2020-07-26 DIAGNOSIS — R000 Tachycardia, unspecified: Secondary | ICD-10-CM | POA: Diagnosis not present

## 2020-07-26 DIAGNOSIS — Z955 Presence of coronary angioplasty implant and graft: Secondary | ICD-10-CM | POA: Diagnosis not present

## 2020-07-26 DIAGNOSIS — Z950 Presence of cardiac pacemaker: Secondary | ICD-10-CM | POA: Diagnosis not present

## 2020-07-26 DIAGNOSIS — I129 Hypertensive chronic kidney disease with stage 1 through stage 4 chronic kidney disease, or unspecified chronic kidney disease: Secondary | ICD-10-CM | POA: Diagnosis not present

## 2020-07-26 DIAGNOSIS — I517 Cardiomegaly: Secondary | ICD-10-CM | POA: Diagnosis not present

## 2020-07-26 DIAGNOSIS — E785 Hyperlipidemia, unspecified: Secondary | ICD-10-CM | POA: Diagnosis not present

## 2020-07-26 DIAGNOSIS — N183 Chronic kidney disease, stage 3 unspecified: Secondary | ICD-10-CM | POA: Diagnosis not present

## 2020-07-26 DIAGNOSIS — I5043 Acute on chronic combined systolic (congestive) and diastolic (congestive) heart failure: Secondary | ICD-10-CM | POA: Diagnosis not present

## 2020-07-26 DIAGNOSIS — J811 Chronic pulmonary edema: Secondary | ICD-10-CM | POA: Diagnosis not present

## 2020-07-26 DIAGNOSIS — I10 Essential (primary) hypertension: Secondary | ICD-10-CM | POA: Diagnosis not present

## 2020-07-26 DIAGNOSIS — Z20822 Contact with and (suspected) exposure to covid-19: Secondary | ICD-10-CM | POA: Diagnosis not present

## 2020-07-26 DIAGNOSIS — R140 Abdominal distension (gaseous): Secondary | ICD-10-CM | POA: Diagnosis not present

## 2020-07-26 DIAGNOSIS — R112 Nausea with vomiting, unspecified: Secondary | ICD-10-CM | POA: Diagnosis not present

## 2020-07-26 DIAGNOSIS — Z95810 Presence of automatic (implantable) cardiac defibrillator: Secondary | ICD-10-CM | POA: Diagnosis not present

## 2020-07-26 DIAGNOSIS — Z951 Presence of aortocoronary bypass graft: Secondary | ICD-10-CM | POA: Diagnosis not present

## 2020-07-26 DIAGNOSIS — I255 Ischemic cardiomyopathy: Secondary | ICD-10-CM | POA: Diagnosis not present

## 2020-07-26 DIAGNOSIS — R609 Edema, unspecified: Secondary | ICD-10-CM | POA: Diagnosis not present

## 2020-07-27 DIAGNOSIS — Z20822 Contact with and (suspected) exposure to covid-19: Secondary | ICD-10-CM | POA: Diagnosis present

## 2020-07-27 DIAGNOSIS — E119 Type 2 diabetes mellitus without complications: Secondary | ICD-10-CM | POA: Diagnosis not present

## 2020-07-27 DIAGNOSIS — I272 Pulmonary hypertension, unspecified: Secondary | ICD-10-CM | POA: Diagnosis present

## 2020-07-27 DIAGNOSIS — I493 Ventricular premature depolarization: Secondary | ICD-10-CM | POA: Diagnosis present

## 2020-07-27 DIAGNOSIS — D61818 Other pancytopenia: Secondary | ICD-10-CM | POA: Diagnosis present

## 2020-07-27 DIAGNOSIS — N183 Chronic kidney disease, stage 3 unspecified: Secondary | ICD-10-CM | POA: Diagnosis present

## 2020-07-27 DIAGNOSIS — K219 Gastro-esophageal reflux disease without esophagitis: Secondary | ICD-10-CM | POA: Diagnosis present

## 2020-07-27 DIAGNOSIS — I10 Essential (primary) hypertension: Secondary | ICD-10-CM | POA: Diagnosis present

## 2020-07-27 DIAGNOSIS — Z720 Tobacco use: Secondary | ICD-10-CM | POA: Diagnosis not present

## 2020-07-27 DIAGNOSIS — Z7982 Long term (current) use of aspirin: Secondary | ICD-10-CM | POA: Diagnosis not present

## 2020-07-27 DIAGNOSIS — M1990 Unspecified osteoarthritis, unspecified site: Secondary | ICD-10-CM | POA: Diagnosis present

## 2020-07-27 DIAGNOSIS — Z9119 Patient's noncompliance with other medical treatment and regimen: Secondary | ICD-10-CM | POA: Diagnosis not present

## 2020-07-27 DIAGNOSIS — E1122 Type 2 diabetes mellitus with diabetic chronic kidney disease: Secondary | ICD-10-CM | POA: Diagnosis present

## 2020-07-27 DIAGNOSIS — I2510 Atherosclerotic heart disease of native coronary artery without angina pectoris: Secondary | ICD-10-CM | POA: Diagnosis present

## 2020-07-27 DIAGNOSIS — I255 Ischemic cardiomyopathy: Secondary | ICD-10-CM | POA: Diagnosis present

## 2020-07-27 DIAGNOSIS — E782 Mixed hyperlipidemia: Secondary | ICD-10-CM | POA: Diagnosis present

## 2020-07-27 DIAGNOSIS — R079 Chest pain, unspecified: Secondary | ICD-10-CM | POA: Diagnosis not present

## 2020-07-27 DIAGNOSIS — I252 Old myocardial infarction: Secondary | ICD-10-CM | POA: Diagnosis not present

## 2020-07-27 DIAGNOSIS — M109 Gout, unspecified: Secondary | ICD-10-CM | POA: Diagnosis present

## 2020-07-27 DIAGNOSIS — Z7984 Long term (current) use of oral hypoglycemic drugs: Secondary | ICD-10-CM | POA: Diagnosis not present

## 2020-07-27 DIAGNOSIS — Z95810 Presence of automatic (implantable) cardiac defibrillator: Secondary | ICD-10-CM | POA: Diagnosis not present

## 2020-07-27 DIAGNOSIS — Z951 Presence of aortocoronary bypass graft: Secondary | ICD-10-CM | POA: Diagnosis not present

## 2020-07-27 DIAGNOSIS — J449 Chronic obstructive pulmonary disease, unspecified: Secondary | ICD-10-CM | POA: Diagnosis present

## 2020-07-27 DIAGNOSIS — I110 Hypertensive heart disease with heart failure: Secondary | ICD-10-CM | POA: Diagnosis not present

## 2020-07-27 DIAGNOSIS — I5043 Acute on chronic combined systolic (congestive) and diastolic (congestive) heart failure: Secondary | ICD-10-CM | POA: Diagnosis present

## 2020-07-27 DIAGNOSIS — I129 Hypertensive chronic kidney disease with stage 1 through stage 4 chronic kidney disease, or unspecified chronic kidney disease: Secondary | ICD-10-CM | POA: Diagnosis not present

## 2020-07-27 DIAGNOSIS — Z79899 Other long term (current) drug therapy: Secondary | ICD-10-CM | POA: Diagnosis not present

## 2020-07-27 DIAGNOSIS — Z8616 Personal history of COVID-19: Secondary | ICD-10-CM | POA: Diagnosis not present

## 2020-07-31 DIAGNOSIS — E1122 Type 2 diabetes mellitus with diabetic chronic kidney disease: Secondary | ICD-10-CM | POA: Diagnosis not present

## 2020-07-31 DIAGNOSIS — J449 Chronic obstructive pulmonary disease, unspecified: Secondary | ICD-10-CM | POA: Diagnosis not present

## 2020-07-31 DIAGNOSIS — K219 Gastro-esophageal reflux disease without esophagitis: Secondary | ICD-10-CM | POA: Diagnosis not present

## 2020-07-31 DIAGNOSIS — Z7982 Long term (current) use of aspirin: Secondary | ICD-10-CM | POA: Diagnosis not present

## 2020-07-31 DIAGNOSIS — Z95810 Presence of automatic (implantable) cardiac defibrillator: Secondary | ICD-10-CM | POA: Diagnosis not present

## 2020-07-31 DIAGNOSIS — Z7984 Long term (current) use of oral hypoglycemic drugs: Secondary | ICD-10-CM | POA: Diagnosis not present

## 2020-07-31 DIAGNOSIS — E785 Hyperlipidemia, unspecified: Secondary | ICD-10-CM | POA: Diagnosis not present

## 2020-07-31 DIAGNOSIS — Z951 Presence of aortocoronary bypass graft: Secondary | ICD-10-CM | POA: Diagnosis not present

## 2020-07-31 DIAGNOSIS — I5023 Acute on chronic systolic (congestive) heart failure: Secondary | ICD-10-CM | POA: Diagnosis not present

## 2020-07-31 DIAGNOSIS — I2510 Atherosclerotic heart disease of native coronary artery without angina pectoris: Secondary | ICD-10-CM | POA: Diagnosis not present

## 2020-07-31 DIAGNOSIS — N183 Chronic kidney disease, stage 3 unspecified: Secondary | ICD-10-CM | POA: Diagnosis not present

## 2020-07-31 DIAGNOSIS — I130 Hypertensive heart and chronic kidney disease with heart failure and stage 1 through stage 4 chronic kidney disease, or unspecified chronic kidney disease: Secondary | ICD-10-CM | POA: Diagnosis not present

## 2020-07-31 DIAGNOSIS — D500 Iron deficiency anemia secondary to blood loss (chronic): Secondary | ICD-10-CM | POA: Diagnosis not present

## 2020-07-31 DIAGNOSIS — I255 Ischemic cardiomyopathy: Secondary | ICD-10-CM | POA: Diagnosis not present

## 2020-08-02 ENCOUNTER — Telehealth: Payer: Self-pay | Admitting: Family Medicine

## 2020-08-02 DIAGNOSIS — I2510 Atherosclerotic heart disease of native coronary artery without angina pectoris: Secondary | ICD-10-CM | POA: Diagnosis not present

## 2020-08-02 DIAGNOSIS — J449 Chronic obstructive pulmonary disease, unspecified: Secondary | ICD-10-CM | POA: Diagnosis not present

## 2020-08-02 DIAGNOSIS — E1122 Type 2 diabetes mellitus with diabetic chronic kidney disease: Secondary | ICD-10-CM | POA: Diagnosis not present

## 2020-08-02 DIAGNOSIS — I130 Hypertensive heart and chronic kidney disease with heart failure and stage 1 through stage 4 chronic kidney disease, or unspecified chronic kidney disease: Secondary | ICD-10-CM | POA: Diagnosis not present

## 2020-08-02 DIAGNOSIS — I5023 Acute on chronic systolic (congestive) heart failure: Secondary | ICD-10-CM | POA: Diagnosis not present

## 2020-08-02 DIAGNOSIS — N183 Chronic kidney disease, stage 3 unspecified: Secondary | ICD-10-CM | POA: Diagnosis not present

## 2020-08-02 NOTE — Telephone Encounter (Signed)
Patrick Stewart @ [facility]  Call Back # [phone]  Patient is having a gout flare up and pain is 9/10. Pain in left lower extremity  Per Patrick Stewart   She would like a verbal order for PT   1 time a week for -9 weeks  And for Social Work for [employer]

## 2020-08-02 NOTE — Telephone Encounter (Signed)
Called informed the patient of PCP instructions. HHRN had left the home already,

## 2020-08-02 NOTE — Telephone Encounter (Signed)
[facility] informed of ok per PCP. Removed as PCP from chart. The RN did want to know what to do regarding the foot pain

## 2020-08-02 NOTE — Telephone Encounter (Signed)
Go to urgent care

## 2020-08-02 NOTE — Telephone Encounter (Signed)
Patient was sent a letter of dismissal on 06/21/20 Advise and does your name need to be removed as his PCP?

## 2020-08-02 NOTE — Telephone Encounter (Signed)
Correct, we can OK this one, but future orders will go to his new PCP. Ty.

## 2020-08-05 ENCOUNTER — Ambulatory Visit: Payer: Medicare Other | Admitting: Neurology

## 2020-08-06 DIAGNOSIS — I252 Old myocardial infarction: Secondary | ICD-10-CM | POA: Diagnosis not present

## 2020-08-06 DIAGNOSIS — I130 Hypertensive heart and chronic kidney disease with heart failure and stage 1 through stage 4 chronic kidney disease, or unspecified chronic kidney disease: Secondary | ICD-10-CM | POA: Diagnosis not present

## 2020-08-06 DIAGNOSIS — I110 Hypertensive heart disease with heart failure: Secondary | ICD-10-CM | POA: Diagnosis not present

## 2020-08-06 DIAGNOSIS — E1140 Type 2 diabetes mellitus with diabetic neuropathy, unspecified: Secondary | ICD-10-CM | POA: Diagnosis not present

## 2020-08-06 DIAGNOSIS — J449 Chronic obstructive pulmonary disease, unspecified: Secondary | ICD-10-CM | POA: Diagnosis not present

## 2020-08-06 DIAGNOSIS — K7460 Unspecified cirrhosis of liver: Secondary | ICD-10-CM | POA: Diagnosis not present

## 2020-08-06 DIAGNOSIS — D61818 Other pancytopenia: Secondary | ICD-10-CM | POA: Diagnosis not present

## 2020-08-06 DIAGNOSIS — I5022 Chronic systolic (congestive) heart failure: Secondary | ICD-10-CM | POA: Diagnosis not present

## 2020-08-06 DIAGNOSIS — E785 Hyperlipidemia, unspecified: Secondary | ICD-10-CM | POA: Diagnosis not present

## 2020-08-06 DIAGNOSIS — I517 Cardiomegaly: Secondary | ICD-10-CM | POA: Diagnosis not present

## 2020-08-06 DIAGNOSIS — Z79899 Other long term (current) drug therapy: Secondary | ICD-10-CM | POA: Diagnosis not present

## 2020-08-06 DIAGNOSIS — K219 Gastro-esophageal reflux disease without esophagitis: Secondary | ICD-10-CM | POA: Diagnosis not present

## 2020-08-07 DIAGNOSIS — I517 Cardiomegaly: Secondary | ICD-10-CM | POA: Diagnosis not present

## 2020-08-07 DIAGNOSIS — I130 Hypertensive heart and chronic kidney disease with heart failure and stage 1 through stage 4 chronic kidney disease, or unspecified chronic kidney disease: Secondary | ICD-10-CM | POA: Diagnosis not present

## 2020-08-07 DIAGNOSIS — I10 Essential (primary) hypertension: Secondary | ICD-10-CM | POA: Diagnosis not present

## 2020-08-07 DIAGNOSIS — R0602 Shortness of breath: Secondary | ICD-10-CM | POA: Diagnosis not present

## 2020-08-07 DIAGNOSIS — U071 COVID-19: Secondary | ICD-10-CM | POA: Diagnosis not present

## 2020-08-07 DIAGNOSIS — R609 Edema, unspecified: Secondary | ICD-10-CM | POA: Diagnosis not present

## 2020-08-07 DIAGNOSIS — R140 Abdominal distension (gaseous): Secondary | ICD-10-CM | POA: Diagnosis not present

## 2020-08-07 DIAGNOSIS — R05 Cough: Secondary | ICD-10-CM | POA: Diagnosis not present

## 2020-08-07 DIAGNOSIS — R188 Other ascites: Secondary | ICD-10-CM | POA: Diagnosis not present

## 2020-08-07 DIAGNOSIS — D631 Anemia in chronic kidney disease: Secondary | ICD-10-CM | POA: Diagnosis not present

## 2020-08-07 DIAGNOSIS — R079 Chest pain, unspecified: Secondary | ICD-10-CM | POA: Diagnosis not present

## 2020-08-07 DIAGNOSIS — N183 Chronic kidney disease, stage 3 unspecified: Secondary | ICD-10-CM | POA: Diagnosis not present

## 2020-08-07 DIAGNOSIS — Z950 Presence of cardiac pacemaker: Secondary | ICD-10-CM | POA: Diagnosis not present

## 2020-08-07 DIAGNOSIS — I129 Hypertensive chronic kidney disease with stage 1 through stage 4 chronic kidney disease, or unspecified chronic kidney disease: Secondary | ICD-10-CM | POA: Diagnosis not present

## 2020-08-07 DIAGNOSIS — E871 Hypo-osmolality and hyponatremia: Secondary | ICD-10-CM | POA: Diagnosis not present

## 2020-08-07 DIAGNOSIS — I5043 Acute on chronic combined systolic (congestive) and diastolic (congestive) heart failure: Secondary | ICD-10-CM | POA: Diagnosis not present

## 2020-08-07 DIAGNOSIS — E1122 Type 2 diabetes mellitus with diabetic chronic kidney disease: Secondary | ICD-10-CM | POA: Diagnosis not present

## 2020-08-08 DIAGNOSIS — Z7982 Long term (current) use of aspirin: Secondary | ICD-10-CM | POA: Diagnosis not present

## 2020-08-08 DIAGNOSIS — D631 Anemia in chronic kidney disease: Secondary | ICD-10-CM | POA: Diagnosis present

## 2020-08-08 DIAGNOSIS — N183 Chronic kidney disease, stage 3 unspecified: Secondary | ICD-10-CM | POA: Diagnosis present

## 2020-08-08 DIAGNOSIS — R680 Hypothermia, not associated with low environmental temperature: Secondary | ICD-10-CM | POA: Diagnosis not present

## 2020-08-08 DIAGNOSIS — Z79899 Other long term (current) drug therapy: Secondary | ICD-10-CM | POA: Diagnosis not present

## 2020-08-08 DIAGNOSIS — R188 Other ascites: Secondary | ICD-10-CM | POA: Diagnosis present

## 2020-08-08 DIAGNOSIS — Z765 Malingerer [conscious simulation]: Secondary | ICD-10-CM | POA: Diagnosis not present

## 2020-08-08 DIAGNOSIS — M25561 Pain in right knee: Secondary | ICD-10-CM | POA: Diagnosis not present

## 2020-08-08 DIAGNOSIS — E1122 Type 2 diabetes mellitus with diabetic chronic kidney disease: Secondary | ICD-10-CM | POA: Diagnosis present

## 2020-08-08 DIAGNOSIS — Z95810 Presence of automatic (implantable) cardiac defibrillator: Secondary | ICD-10-CM | POA: Diagnosis not present

## 2020-08-08 DIAGNOSIS — U071 COVID-19: Secondary | ICD-10-CM | POA: Diagnosis present

## 2020-08-08 DIAGNOSIS — I5020 Unspecified systolic (congestive) heart failure: Secondary | ICD-10-CM | POA: Diagnosis not present

## 2020-08-08 DIAGNOSIS — M109 Gout, unspecified: Secondary | ICD-10-CM | POA: Diagnosis present

## 2020-08-08 DIAGNOSIS — J449 Chronic obstructive pulmonary disease, unspecified: Secondary | ICD-10-CM | POA: Diagnosis present

## 2020-08-08 DIAGNOSIS — E785 Hyperlipidemia, unspecified: Secondary | ICD-10-CM | POA: Diagnosis present

## 2020-08-08 DIAGNOSIS — I5043 Acute on chronic combined systolic (congestive) and diastolic (congestive) heart failure: Secondary | ICD-10-CM | POA: Diagnosis present

## 2020-08-08 DIAGNOSIS — D703 Neutropenia due to infection: Secondary | ICD-10-CM | POA: Diagnosis present

## 2020-08-08 DIAGNOSIS — E119 Type 2 diabetes mellitus without complications: Secondary | ICD-10-CM | POA: Diagnosis not present

## 2020-08-08 DIAGNOSIS — N179 Acute kidney failure, unspecified: Secondary | ICD-10-CM | POA: Diagnosis not present

## 2020-08-08 DIAGNOSIS — E871 Hypo-osmolality and hyponatremia: Secondary | ICD-10-CM | POA: Diagnosis present

## 2020-08-08 DIAGNOSIS — D72810 Lymphocytopenia: Secondary | ICD-10-CM | POA: Diagnosis present

## 2020-08-08 DIAGNOSIS — G8929 Other chronic pain: Secondary | ICD-10-CM | POA: Diagnosis present

## 2020-08-08 DIAGNOSIS — I5023 Acute on chronic systolic (congestive) heart failure: Secondary | ICD-10-CM | POA: Diagnosis not present

## 2020-08-08 DIAGNOSIS — K219 Gastro-esophageal reflux disease without esophagitis: Secondary | ICD-10-CM | POA: Diagnosis present

## 2020-08-08 DIAGNOSIS — Z951 Presence of aortocoronary bypass graft: Secondary | ICD-10-CM | POA: Diagnosis not present

## 2020-08-08 DIAGNOSIS — E875 Hyperkalemia: Secondary | ICD-10-CM | POA: Diagnosis not present

## 2020-08-08 DIAGNOSIS — I2510 Atherosclerotic heart disease of native coronary artery without angina pectoris: Secondary | ICD-10-CM | POA: Diagnosis present

## 2020-08-08 DIAGNOSIS — I255 Ischemic cardiomyopathy: Secondary | ICD-10-CM | POA: Diagnosis present

## 2020-08-08 DIAGNOSIS — Z87891 Personal history of nicotine dependence: Secondary | ICD-10-CM | POA: Diagnosis not present

## 2020-08-08 DIAGNOSIS — I130 Hypertensive heart and chronic kidney disease with heart failure and stage 1 through stage 4 chronic kidney disease, or unspecified chronic kidney disease: Secondary | ICD-10-CM | POA: Diagnosis present

## 2020-08-16 DIAGNOSIS — I130 Hypertensive heart and chronic kidney disease with heart failure and stage 1 through stage 4 chronic kidney disease, or unspecified chronic kidney disease: Secondary | ICD-10-CM | POA: Diagnosis not present

## 2020-08-16 DIAGNOSIS — E1122 Type 2 diabetes mellitus with diabetic chronic kidney disease: Secondary | ICD-10-CM | POA: Diagnosis not present

## 2020-08-16 DIAGNOSIS — J449 Chronic obstructive pulmonary disease, unspecified: Secondary | ICD-10-CM | POA: Diagnosis not present

## 2020-08-16 DIAGNOSIS — I2510 Atherosclerotic heart disease of native coronary artery without angina pectoris: Secondary | ICD-10-CM | POA: Diagnosis not present

## 2020-08-16 DIAGNOSIS — N183 Chronic kidney disease, stage 3 unspecified: Secondary | ICD-10-CM | POA: Diagnosis not present

## 2020-08-16 DIAGNOSIS — I5023 Acute on chronic systolic (congestive) heart failure: Secondary | ICD-10-CM | POA: Diagnosis not present

## 2020-08-19 DIAGNOSIS — N183 Chronic kidney disease, stage 3 unspecified: Secondary | ICD-10-CM | POA: Diagnosis not present

## 2020-08-19 DIAGNOSIS — J449 Chronic obstructive pulmonary disease, unspecified: Secondary | ICD-10-CM | POA: Diagnosis not present

## 2020-08-19 DIAGNOSIS — I5023 Acute on chronic systolic (congestive) heart failure: Secondary | ICD-10-CM | POA: Diagnosis not present

## 2020-08-19 DIAGNOSIS — I130 Hypertensive heart and chronic kidney disease with heart failure and stage 1 through stage 4 chronic kidney disease, or unspecified chronic kidney disease: Secondary | ICD-10-CM | POA: Diagnosis not present

## 2020-08-19 DIAGNOSIS — I2510 Atherosclerotic heart disease of native coronary artery without angina pectoris: Secondary | ICD-10-CM | POA: Diagnosis not present

## 2020-08-19 DIAGNOSIS — E1122 Type 2 diabetes mellitus with diabetic chronic kidney disease: Secondary | ICD-10-CM | POA: Diagnosis not present

## 2020-08-20 DIAGNOSIS — Z79899 Other long term (current) drug therapy: Secondary | ICD-10-CM | POA: Diagnosis not present

## 2020-08-20 DIAGNOSIS — Z8616 Personal history of COVID-19: Secondary | ICD-10-CM | POA: Diagnosis not present

## 2020-08-20 DIAGNOSIS — M109 Gout, unspecified: Secondary | ICD-10-CM | POA: Diagnosis not present

## 2020-08-20 DIAGNOSIS — Z008 Encounter for other general examination: Secondary | ICD-10-CM | POA: Diagnosis not present

## 2020-08-20 DIAGNOSIS — I5022 Chronic systolic (congestive) heart failure: Secondary | ICD-10-CM | POA: Diagnosis not present

## 2020-08-20 DIAGNOSIS — J449 Chronic obstructive pulmonary disease, unspecified: Secondary | ICD-10-CM | POA: Diagnosis not present

## 2020-08-21 DIAGNOSIS — J449 Chronic obstructive pulmonary disease, unspecified: Secondary | ICD-10-CM | POA: Diagnosis not present

## 2020-08-21 DIAGNOSIS — I2510 Atherosclerotic heart disease of native coronary artery without angina pectoris: Secondary | ICD-10-CM | POA: Diagnosis not present

## 2020-08-21 DIAGNOSIS — I5023 Acute on chronic systolic (congestive) heart failure: Secondary | ICD-10-CM | POA: Diagnosis not present

## 2020-08-21 DIAGNOSIS — N183 Chronic kidney disease, stage 3 unspecified: Secondary | ICD-10-CM | POA: Diagnosis not present

## 2020-08-21 DIAGNOSIS — I130 Hypertensive heart and chronic kidney disease with heart failure and stage 1 through stage 4 chronic kidney disease, or unspecified chronic kidney disease: Secondary | ICD-10-CM | POA: Diagnosis not present

## 2020-08-21 DIAGNOSIS — E1122 Type 2 diabetes mellitus with diabetic chronic kidney disease: Secondary | ICD-10-CM | POA: Diagnosis not present

## 2020-08-22 ENCOUNTER — Other Ambulatory Visit: Payer: Self-pay | Admitting: Family Medicine

## 2020-08-22 DIAGNOSIS — I25119 Atherosclerotic heart disease of native coronary artery with unspecified angina pectoris: Secondary | ICD-10-CM

## 2020-08-23 DIAGNOSIS — E1122 Type 2 diabetes mellitus with diabetic chronic kidney disease: Secondary | ICD-10-CM | POA: Diagnosis not present

## 2020-08-23 DIAGNOSIS — I2510 Atherosclerotic heart disease of native coronary artery without angina pectoris: Secondary | ICD-10-CM | POA: Diagnosis not present

## 2020-08-23 DIAGNOSIS — J449 Chronic obstructive pulmonary disease, unspecified: Secondary | ICD-10-CM | POA: Diagnosis not present

## 2020-08-23 DIAGNOSIS — I130 Hypertensive heart and chronic kidney disease with heart failure and stage 1 through stage 4 chronic kidney disease, or unspecified chronic kidney disease: Secondary | ICD-10-CM | POA: Diagnosis not present

## 2020-08-23 DIAGNOSIS — N183 Chronic kidney disease, stage 3 unspecified: Secondary | ICD-10-CM | POA: Diagnosis not present

## 2020-08-23 DIAGNOSIS — I5023 Acute on chronic systolic (congestive) heart failure: Secondary | ICD-10-CM | POA: Diagnosis not present

## 2020-08-27 DIAGNOSIS — I2510 Atherosclerotic heart disease of native coronary artery without angina pectoris: Secondary | ICD-10-CM | POA: Diagnosis not present

## 2020-08-27 DIAGNOSIS — N183 Chronic kidney disease, stage 3 unspecified: Secondary | ICD-10-CM | POA: Diagnosis not present

## 2020-08-27 DIAGNOSIS — I130 Hypertensive heart and chronic kidney disease with heart failure and stage 1 through stage 4 chronic kidney disease, or unspecified chronic kidney disease: Secondary | ICD-10-CM | POA: Diagnosis not present

## 2020-08-27 DIAGNOSIS — I5023 Acute on chronic systolic (congestive) heart failure: Secondary | ICD-10-CM | POA: Diagnosis not present

## 2020-08-27 DIAGNOSIS — J449 Chronic obstructive pulmonary disease, unspecified: Secondary | ICD-10-CM | POA: Diagnosis not present

## 2020-08-27 DIAGNOSIS — E1122 Type 2 diabetes mellitus with diabetic chronic kidney disease: Secondary | ICD-10-CM | POA: Diagnosis not present

## 2020-08-29 DIAGNOSIS — N183 Chronic kidney disease, stage 3 unspecified: Secondary | ICD-10-CM | POA: Diagnosis not present

## 2020-08-29 DIAGNOSIS — I5023 Acute on chronic systolic (congestive) heart failure: Secondary | ICD-10-CM | POA: Diagnosis not present

## 2020-08-29 DIAGNOSIS — E1122 Type 2 diabetes mellitus with diabetic chronic kidney disease: Secondary | ICD-10-CM | POA: Diagnosis not present

## 2020-08-29 DIAGNOSIS — I2510 Atherosclerotic heart disease of native coronary artery without angina pectoris: Secondary | ICD-10-CM | POA: Diagnosis not present

## 2020-08-29 DIAGNOSIS — J449 Chronic obstructive pulmonary disease, unspecified: Secondary | ICD-10-CM | POA: Diagnosis not present

## 2020-08-29 DIAGNOSIS — I130 Hypertensive heart and chronic kidney disease with heart failure and stage 1 through stage 4 chronic kidney disease, or unspecified chronic kidney disease: Secondary | ICD-10-CM | POA: Diagnosis not present

## 2020-08-30 DIAGNOSIS — Z95810 Presence of automatic (implantable) cardiac defibrillator: Secondary | ICD-10-CM | POA: Diagnosis not present

## 2020-08-30 DIAGNOSIS — D500 Iron deficiency anemia secondary to blood loss (chronic): Secondary | ICD-10-CM | POA: Diagnosis not present

## 2020-08-30 DIAGNOSIS — I130 Hypertensive heart and chronic kidney disease with heart failure and stage 1 through stage 4 chronic kidney disease, or unspecified chronic kidney disease: Secondary | ICD-10-CM | POA: Diagnosis not present

## 2020-08-30 DIAGNOSIS — Z8616 Personal history of COVID-19: Secondary | ICD-10-CM | POA: Diagnosis not present

## 2020-08-30 DIAGNOSIS — J449 Chronic obstructive pulmonary disease, unspecified: Secondary | ICD-10-CM | POA: Diagnosis not present

## 2020-08-30 DIAGNOSIS — M109 Gout, unspecified: Secondary | ICD-10-CM | POA: Diagnosis not present

## 2020-08-30 DIAGNOSIS — K219 Gastro-esophageal reflux disease without esophagitis: Secondary | ICD-10-CM | POA: Diagnosis not present

## 2020-08-30 DIAGNOSIS — E1122 Type 2 diabetes mellitus with diabetic chronic kidney disease: Secondary | ICD-10-CM | POA: Diagnosis not present

## 2020-08-30 DIAGNOSIS — I5043 Acute on chronic combined systolic (congestive) and diastolic (congestive) heart failure: Secondary | ICD-10-CM | POA: Diagnosis not present

## 2020-08-30 DIAGNOSIS — Z7984 Long term (current) use of oral hypoglycemic drugs: Secondary | ICD-10-CM | POA: Diagnosis not present

## 2020-08-30 DIAGNOSIS — E785 Hyperlipidemia, unspecified: Secondary | ICD-10-CM | POA: Diagnosis not present

## 2020-08-30 DIAGNOSIS — I255 Ischemic cardiomyopathy: Secondary | ICD-10-CM | POA: Diagnosis not present

## 2020-08-30 DIAGNOSIS — Z87891 Personal history of nicotine dependence: Secondary | ICD-10-CM | POA: Diagnosis not present

## 2020-08-30 DIAGNOSIS — Z951 Presence of aortocoronary bypass graft: Secondary | ICD-10-CM | POA: Diagnosis not present

## 2020-08-30 DIAGNOSIS — I2510 Atherosclerotic heart disease of native coronary artery without angina pectoris: Secondary | ICD-10-CM | POA: Diagnosis not present

## 2020-08-30 DIAGNOSIS — N183 Chronic kidney disease, stage 3 unspecified: Secondary | ICD-10-CM | POA: Diagnosis not present

## 2020-08-30 DIAGNOSIS — Z9181 History of falling: Secondary | ICD-10-CM | POA: Diagnosis not present

## 2020-09-03 ENCOUNTER — Ambulatory Visit: Payer: Medicare Other | Admitting: Family Medicine

## 2020-09-03 DIAGNOSIS — E1122 Type 2 diabetes mellitus with diabetic chronic kidney disease: Secondary | ICD-10-CM | POA: Diagnosis not present

## 2020-09-03 DIAGNOSIS — N183 Chronic kidney disease, stage 3 unspecified: Secondary | ICD-10-CM | POA: Diagnosis not present

## 2020-09-03 DIAGNOSIS — I5043 Acute on chronic combined systolic (congestive) and diastolic (congestive) heart failure: Secondary | ICD-10-CM | POA: Diagnosis not present

## 2020-09-03 DIAGNOSIS — I130 Hypertensive heart and chronic kidney disease with heart failure and stage 1 through stage 4 chronic kidney disease, or unspecified chronic kidney disease: Secondary | ICD-10-CM | POA: Diagnosis not present

## 2020-09-03 DIAGNOSIS — J449 Chronic obstructive pulmonary disease, unspecified: Secondary | ICD-10-CM | POA: Diagnosis not present

## 2020-09-03 DIAGNOSIS — I2510 Atherosclerotic heart disease of native coronary artery without angina pectoris: Secondary | ICD-10-CM | POA: Diagnosis not present

## 2020-09-04 DIAGNOSIS — J449 Chronic obstructive pulmonary disease, unspecified: Secondary | ICD-10-CM | POA: Diagnosis not present

## 2020-09-04 DIAGNOSIS — I130 Hypertensive heart and chronic kidney disease with heart failure and stage 1 through stage 4 chronic kidney disease, or unspecified chronic kidney disease: Secondary | ICD-10-CM | POA: Diagnosis not present

## 2020-09-04 DIAGNOSIS — I5043 Acute on chronic combined systolic (congestive) and diastolic (congestive) heart failure: Secondary | ICD-10-CM | POA: Diagnosis not present

## 2020-09-04 DIAGNOSIS — I2510 Atherosclerotic heart disease of native coronary artery without angina pectoris: Secondary | ICD-10-CM | POA: Diagnosis not present

## 2020-09-04 DIAGNOSIS — E1122 Type 2 diabetes mellitus with diabetic chronic kidney disease: Secondary | ICD-10-CM | POA: Diagnosis not present

## 2020-09-04 DIAGNOSIS — N183 Chronic kidney disease, stage 3 unspecified: Secondary | ICD-10-CM | POA: Diagnosis not present

## 2020-09-05 DIAGNOSIS — I495 Sick sinus syndrome: Secondary | ICD-10-CM | POA: Diagnosis not present

## 2020-09-05 DIAGNOSIS — Z008 Encounter for other general examination: Secondary | ICD-10-CM | POA: Diagnosis not present

## 2020-09-05 DIAGNOSIS — G4700 Insomnia, unspecified: Secondary | ICD-10-CM | POA: Diagnosis not present

## 2020-09-05 DIAGNOSIS — K219 Gastro-esophageal reflux disease without esophagitis: Secondary | ICD-10-CM | POA: Diagnosis not present

## 2020-09-05 DIAGNOSIS — I5022 Chronic systolic (congestive) heart failure: Secondary | ICD-10-CM | POA: Diagnosis not present

## 2020-09-05 DIAGNOSIS — F0390 Unspecified dementia without behavioral disturbance: Secondary | ICD-10-CM | POA: Diagnosis not present

## 2020-09-05 DIAGNOSIS — I25119 Atherosclerotic heart disease of native coronary artery with unspecified angina pectoris: Secondary | ICD-10-CM | POA: Diagnosis not present

## 2020-09-05 DIAGNOSIS — Z0001 Encounter for general adult medical examination with abnormal findings: Secondary | ICD-10-CM | POA: Diagnosis not present

## 2020-09-05 DIAGNOSIS — F330 Major depressive disorder, recurrent, mild: Secondary | ICD-10-CM | POA: Diagnosis not present

## 2020-09-05 DIAGNOSIS — I130 Hypertensive heart and chronic kidney disease with heart failure and stage 1 through stage 4 chronic kidney disease, or unspecified chronic kidney disease: Secondary | ICD-10-CM | POA: Diagnosis not present

## 2020-09-05 DIAGNOSIS — Z79899 Other long term (current) drug therapy: Secondary | ICD-10-CM | POA: Diagnosis not present

## 2020-09-05 DIAGNOSIS — K7460 Unspecified cirrhosis of liver: Secondary | ICD-10-CM | POA: Diagnosis not present

## 2020-09-06 DIAGNOSIS — R079 Chest pain, unspecified: Secondary | ICD-10-CM | POA: Diagnosis not present

## 2020-09-06 DIAGNOSIS — I10 Essential (primary) hypertension: Secondary | ICD-10-CM | POA: Diagnosis present

## 2020-09-06 DIAGNOSIS — Z7982 Long term (current) use of aspirin: Secondary | ICD-10-CM | POA: Diagnosis not present

## 2020-09-06 DIAGNOSIS — I255 Ischemic cardiomyopathy: Secondary | ICD-10-CM | POA: Diagnosis present

## 2020-09-06 DIAGNOSIS — E1122 Type 2 diabetes mellitus with diabetic chronic kidney disease: Secondary | ICD-10-CM | POA: Diagnosis present

## 2020-09-06 DIAGNOSIS — N179 Acute kidney failure, unspecified: Secondary | ICD-10-CM | POA: Diagnosis not present

## 2020-09-06 DIAGNOSIS — R1084 Generalized abdominal pain: Secondary | ICD-10-CM | POA: Diagnosis not present

## 2020-09-06 DIAGNOSIS — Z951 Presence of aortocoronary bypass graft: Secondary | ICD-10-CM | POA: Diagnosis not present

## 2020-09-06 DIAGNOSIS — N183 Chronic kidney disease, stage 3 unspecified: Secondary | ICD-10-CM | POA: Diagnosis present

## 2020-09-06 DIAGNOSIS — I2510 Atherosclerotic heart disease of native coronary artery without angina pectoris: Secondary | ICD-10-CM | POA: Diagnosis present

## 2020-09-06 DIAGNOSIS — I5023 Acute on chronic systolic (congestive) heart failure: Secondary | ICD-10-CM | POA: Diagnosis not present

## 2020-09-06 DIAGNOSIS — D631 Anemia in chronic kidney disease: Secondary | ICD-10-CM | POA: Diagnosis present

## 2020-09-06 DIAGNOSIS — R188 Other ascites: Secondary | ICD-10-CM | POA: Diagnosis not present

## 2020-09-06 DIAGNOSIS — R0902 Hypoxemia: Secondary | ICD-10-CM | POA: Diagnosis not present

## 2020-09-06 DIAGNOSIS — R140 Abdominal distension (gaseous): Secondary | ICD-10-CM | POA: Diagnosis not present

## 2020-09-06 DIAGNOSIS — K219 Gastro-esophageal reflux disease without esophagitis: Secondary | ICD-10-CM | POA: Diagnosis present

## 2020-09-06 DIAGNOSIS — I517 Cardiomegaly: Secondary | ICD-10-CM | POA: Diagnosis not present

## 2020-09-06 DIAGNOSIS — Z87891 Personal history of nicotine dependence: Secondary | ICD-10-CM | POA: Diagnosis not present

## 2020-09-06 DIAGNOSIS — J449 Chronic obstructive pulmonary disease, unspecified: Secondary | ICD-10-CM | POA: Diagnosis present

## 2020-09-06 DIAGNOSIS — I5022 Chronic systolic (congestive) heart failure: Secondary | ICD-10-CM | POA: Diagnosis not present

## 2020-09-06 DIAGNOSIS — M109 Gout, unspecified: Secondary | ICD-10-CM | POA: Diagnosis present

## 2020-09-06 DIAGNOSIS — R0789 Other chest pain: Secondary | ICD-10-CM | POA: Diagnosis not present

## 2020-09-06 DIAGNOSIS — Z7984 Long term (current) use of oral hypoglycemic drugs: Secondary | ICD-10-CM | POA: Diagnosis not present

## 2020-09-06 DIAGNOSIS — I130 Hypertensive heart and chronic kidney disease with heart failure and stage 1 through stage 4 chronic kidney disease, or unspecified chronic kidney disease: Secondary | ICD-10-CM | POA: Diagnosis not present

## 2020-09-06 DIAGNOSIS — R0602 Shortness of breath: Secondary | ICD-10-CM | POA: Diagnosis not present

## 2020-09-06 DIAGNOSIS — Z79899 Other long term (current) drug therapy: Secondary | ICD-10-CM | POA: Diagnosis not present

## 2020-09-06 DIAGNOSIS — E876 Hypokalemia: Secondary | ICD-10-CM | POA: Diagnosis present

## 2020-09-06 DIAGNOSIS — U071 COVID-19: Secondary | ICD-10-CM | POA: Diagnosis not present

## 2020-09-06 DIAGNOSIS — Z8616 Personal history of COVID-19: Secondary | ICD-10-CM | POA: Diagnosis not present

## 2020-09-06 DIAGNOSIS — Z8701 Personal history of pneumonia (recurrent): Secondary | ICD-10-CM | POA: Diagnosis not present

## 2020-09-06 DIAGNOSIS — T502X5A Adverse effect of carbonic-anhydrase inhibitors, benzothiadiazides and other diuretics, initial encounter: Secondary | ICD-10-CM | POA: Diagnosis present

## 2020-09-06 DIAGNOSIS — R748 Abnormal levels of other serum enzymes: Secondary | ICD-10-CM | POA: Diagnosis not present

## 2020-09-06 DIAGNOSIS — Z950 Presence of cardiac pacemaker: Secondary | ICD-10-CM | POA: Diagnosis not present

## 2020-09-06 DIAGNOSIS — E785 Hyperlipidemia, unspecified: Secondary | ICD-10-CM | POA: Diagnosis present

## 2020-09-10 DIAGNOSIS — I2510 Atherosclerotic heart disease of native coronary artery without angina pectoris: Secondary | ICD-10-CM | POA: Diagnosis not present

## 2020-09-10 DIAGNOSIS — I130 Hypertensive heart and chronic kidney disease with heart failure and stage 1 through stage 4 chronic kidney disease, or unspecified chronic kidney disease: Secondary | ICD-10-CM | POA: Diagnosis not present

## 2020-09-10 DIAGNOSIS — E1122 Type 2 diabetes mellitus with diabetic chronic kidney disease: Secondary | ICD-10-CM | POA: Diagnosis not present

## 2020-09-10 DIAGNOSIS — J449 Chronic obstructive pulmonary disease, unspecified: Secondary | ICD-10-CM | POA: Diagnosis not present

## 2020-09-10 DIAGNOSIS — I5043 Acute on chronic combined systolic (congestive) and diastolic (congestive) heart failure: Secondary | ICD-10-CM | POA: Diagnosis not present

## 2020-09-10 DIAGNOSIS — N183 Chronic kidney disease, stage 3 unspecified: Secondary | ICD-10-CM | POA: Diagnosis not present

## 2020-09-11 DIAGNOSIS — N1831 Chronic kidney disease, stage 3a: Secondary | ICD-10-CM | POA: Diagnosis not present

## 2020-09-11 DIAGNOSIS — E1140 Type 2 diabetes mellitus with diabetic neuropathy, unspecified: Secondary | ICD-10-CM | POA: Diagnosis not present

## 2020-09-11 DIAGNOSIS — Z79899 Other long term (current) drug therapy: Secondary | ICD-10-CM | POA: Diagnosis not present

## 2020-09-11 DIAGNOSIS — I130 Hypertensive heart and chronic kidney disease with heart failure and stage 1 through stage 4 chronic kidney disease, or unspecified chronic kidney disease: Secondary | ICD-10-CM | POA: Diagnosis not present

## 2020-09-11 DIAGNOSIS — K7460 Unspecified cirrhosis of liver: Secondary | ICD-10-CM | POA: Diagnosis not present

## 2020-09-11 DIAGNOSIS — M549 Dorsalgia, unspecified: Secondary | ICD-10-CM | POA: Diagnosis not present

## 2020-09-11 DIAGNOSIS — R188 Other ascites: Secondary | ICD-10-CM | POA: Diagnosis not present

## 2020-09-11 DIAGNOSIS — R011 Cardiac murmur, unspecified: Secondary | ICD-10-CM | POA: Diagnosis not present

## 2020-09-11 DIAGNOSIS — M109 Gout, unspecified: Secondary | ICD-10-CM | POA: Diagnosis not present

## 2020-09-11 DIAGNOSIS — L299 Pruritus, unspecified: Secondary | ICD-10-CM | POA: Diagnosis not present

## 2020-09-11 DIAGNOSIS — J449 Chronic obstructive pulmonary disease, unspecified: Secondary | ICD-10-CM | POA: Diagnosis not present

## 2020-09-11 DIAGNOSIS — I5022 Chronic systolic (congestive) heart failure: Secondary | ICD-10-CM | POA: Diagnosis not present

## 2020-09-16 DIAGNOSIS — J449 Chronic obstructive pulmonary disease, unspecified: Secondary | ICD-10-CM | POA: Diagnosis not present

## 2020-09-16 DIAGNOSIS — I5043 Acute on chronic combined systolic (congestive) and diastolic (congestive) heart failure: Secondary | ICD-10-CM | POA: Diagnosis not present

## 2020-09-16 DIAGNOSIS — E1122 Type 2 diabetes mellitus with diabetic chronic kidney disease: Secondary | ICD-10-CM | POA: Diagnosis not present

## 2020-09-16 DIAGNOSIS — I130 Hypertensive heart and chronic kidney disease with heart failure and stage 1 through stage 4 chronic kidney disease, or unspecified chronic kidney disease: Secondary | ICD-10-CM | POA: Diagnosis not present

## 2020-09-16 DIAGNOSIS — N183 Chronic kidney disease, stage 3 unspecified: Secondary | ICD-10-CM | POA: Diagnosis not present

## 2020-09-16 DIAGNOSIS — I2510 Atherosclerotic heart disease of native coronary artery without angina pectoris: Secondary | ICD-10-CM | POA: Diagnosis not present

## 2020-09-17 DIAGNOSIS — I130 Hypertensive heart and chronic kidney disease with heart failure and stage 1 through stage 4 chronic kidney disease, or unspecified chronic kidney disease: Secondary | ICD-10-CM | POA: Diagnosis not present

## 2020-09-17 DIAGNOSIS — J449 Chronic obstructive pulmonary disease, unspecified: Secondary | ICD-10-CM | POA: Diagnosis not present

## 2020-09-17 DIAGNOSIS — I2510 Atherosclerotic heart disease of native coronary artery without angina pectoris: Secondary | ICD-10-CM | POA: Diagnosis not present

## 2020-09-17 DIAGNOSIS — N183 Chronic kidney disease, stage 3 unspecified: Secondary | ICD-10-CM | POA: Diagnosis not present

## 2020-09-17 DIAGNOSIS — I5043 Acute on chronic combined systolic (congestive) and diastolic (congestive) heart failure: Secondary | ICD-10-CM | POA: Diagnosis not present

## 2020-09-17 DIAGNOSIS — E1122 Type 2 diabetes mellitus with diabetic chronic kidney disease: Secondary | ICD-10-CM | POA: Diagnosis not present

## 2020-09-17 IMAGING — DX PORTABLE CHEST - 1 VIEW
1 series · 1 of 1 positions shown · non-contrast
Comparison: Chest x-ray 01/22/2019.

CLINICAL DATA: 62-year-old male with history of bilateral lower
extremity swelling.

EXAM:
PORTABLE CHEST 1 VIEW

[chest ap]
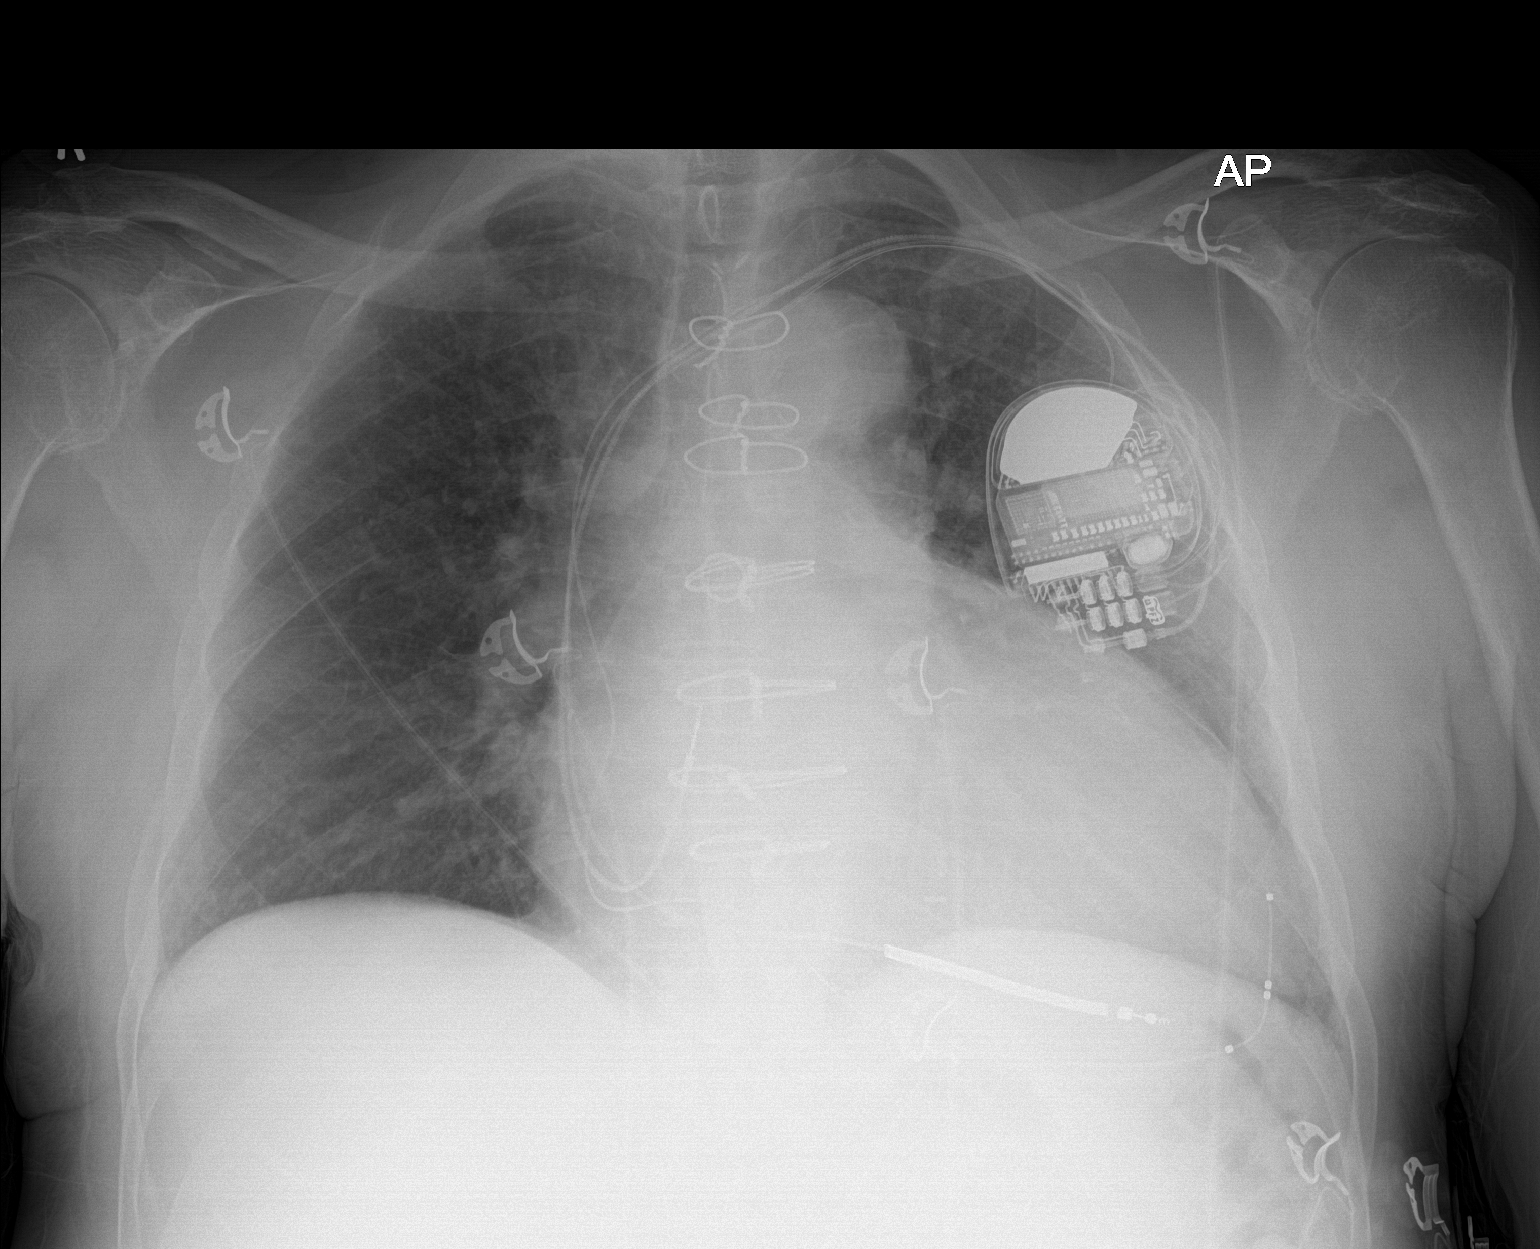

[1 of 1 positions shown; findings below may reference images not displayed]

FINDINGS: Lung volumes are normal. No consolidative airspace disease. No
pleural effusions. Mild cephalization of the pulmonary vasculature,
without frank pulmonary edema. Moderate cardiomegaly. Upper
mediastinal contours are within normal limits. Aortic
atherosclerosis. Status post median sternotomy for CABG. Left-sided
biventricular pacemaker/AICD with lead tips projecting over the
expected location of the right atrium, right ventricle and left
ventricle via the coronary sinus and coronary veins.
IMPRESSION: 1. Cardiomegaly with pulmonary venous congestion, but no frank
pulmonary edema.
2. Aortic atherosclerosis.

## 2020-09-24 DIAGNOSIS — I2510 Atherosclerotic heart disease of native coronary artery without angina pectoris: Secondary | ICD-10-CM | POA: Diagnosis not present

## 2020-09-24 DIAGNOSIS — I130 Hypertensive heart and chronic kidney disease with heart failure and stage 1 through stage 4 chronic kidney disease, or unspecified chronic kidney disease: Secondary | ICD-10-CM | POA: Diagnosis not present

## 2020-09-24 DIAGNOSIS — I5043 Acute on chronic combined systolic (congestive) and diastolic (congestive) heart failure: Secondary | ICD-10-CM | POA: Diagnosis not present

## 2020-09-24 DIAGNOSIS — N183 Chronic kidney disease, stage 3 unspecified: Secondary | ICD-10-CM | POA: Diagnosis not present

## 2020-09-24 DIAGNOSIS — J449 Chronic obstructive pulmonary disease, unspecified: Secondary | ICD-10-CM | POA: Diagnosis not present

## 2020-09-24 DIAGNOSIS — E1122 Type 2 diabetes mellitus with diabetic chronic kidney disease: Secondary | ICD-10-CM | POA: Diagnosis not present

## 2020-09-25 ENCOUNTER — Telehealth: Payer: Self-pay | Admitting: Family Medicine

## 2020-09-25 DIAGNOSIS — N183 Chronic kidney disease, stage 3 unspecified: Secondary | ICD-10-CM | POA: Diagnosis not present

## 2020-09-25 DIAGNOSIS — J449 Chronic obstructive pulmonary disease, unspecified: Secondary | ICD-10-CM | POA: Diagnosis not present

## 2020-09-25 DIAGNOSIS — I130 Hypertensive heart and chronic kidney disease with heart failure and stage 1 through stage 4 chronic kidney disease, or unspecified chronic kidney disease: Secondary | ICD-10-CM | POA: Diagnosis not present

## 2020-09-25 DIAGNOSIS — I2510 Atherosclerotic heart disease of native coronary artery without angina pectoris: Secondary | ICD-10-CM | POA: Diagnosis not present

## 2020-09-25 DIAGNOSIS — I5043 Acute on chronic combined systolic (congestive) and diastolic (congestive) heart failure: Secondary | ICD-10-CM | POA: Diagnosis not present

## 2020-09-25 DIAGNOSIS — E1122 Type 2 diabetes mellitus with diabetic chronic kidney disease: Secondary | ICD-10-CM | POA: Diagnosis not present

## 2020-09-25 NOTE — Telephone Encounter (Signed)
[facility]) dropped of plan of care forms for w[place]ling to sign. Patient was dismissed at end of June.   Would like forms to be faxed to number on sheet [phone] If we have any questions call [phone]

## 2020-09-25 NOTE — Telephone Encounter (Signed)
PCP dismissed the patient on 06/21/20. Informed [facility] they would need new PCP to sign paperwork.

## 2020-09-25 NOTE — Telephone Encounter (Signed)
Patient dismissed from practice.

## 2020-09-26 DIAGNOSIS — Z008 Encounter for other general examination: Secondary | ICD-10-CM | POA: Diagnosis not present

## 2020-09-26 DIAGNOSIS — N1831 Chronic kidney disease, stage 3a: Secondary | ICD-10-CM | POA: Diagnosis not present

## 2020-09-26 DIAGNOSIS — Z6825 Body mass index (BMI) 25.0-25.9, adult: Secondary | ICD-10-CM | POA: Diagnosis not present

## 2020-09-26 DIAGNOSIS — R188 Other ascites: Secondary | ICD-10-CM | POA: Diagnosis not present

## 2020-09-26 DIAGNOSIS — I5022 Chronic systolic (congestive) heart failure: Secondary | ICD-10-CM | POA: Diagnosis not present

## 2020-09-26 DIAGNOSIS — K7460 Unspecified cirrhosis of liver: Secondary | ICD-10-CM | POA: Diagnosis not present

## 2020-09-26 DIAGNOSIS — I130 Hypertensive heart and chronic kidney disease with heart failure and stage 1 through stage 4 chronic kidney disease, or unspecified chronic kidney disease: Secondary | ICD-10-CM | POA: Diagnosis not present

## 2020-09-26 DIAGNOSIS — Z7189 Other specified counseling: Secondary | ICD-10-CM | POA: Diagnosis not present

## 2020-09-26 DIAGNOSIS — Z79899 Other long term (current) drug therapy: Secondary | ICD-10-CM | POA: Diagnosis not present

## 2020-09-26 DIAGNOSIS — E663 Overweight: Secondary | ICD-10-CM | POA: Diagnosis not present

## 2020-09-26 DIAGNOSIS — K766 Portal hypertension: Secondary | ICD-10-CM | POA: Diagnosis not present

## 2020-09-26 DIAGNOSIS — I509 Heart failure, unspecified: Secondary | ICD-10-CM | POA: Diagnosis not present

## 2020-09-27 DIAGNOSIS — Z79899 Other long term (current) drug therapy: Secondary | ICD-10-CM | POA: Diagnosis not present

## 2020-09-27 DIAGNOSIS — K7460 Unspecified cirrhosis of liver: Secondary | ICD-10-CM | POA: Diagnosis not present

## 2020-09-27 DIAGNOSIS — R188 Other ascites: Secondary | ICD-10-CM | POA: Diagnosis not present

## 2020-09-27 DIAGNOSIS — Z7984 Long term (current) use of oral hypoglycemic drugs: Secondary | ICD-10-CM | POA: Diagnosis not present

## 2020-09-29 DIAGNOSIS — I5042 Chronic combined systolic (congestive) and diastolic (congestive) heart failure: Secondary | ICD-10-CM | POA: Diagnosis not present

## 2020-09-29 DIAGNOSIS — Z95810 Presence of automatic (implantable) cardiac defibrillator: Secondary | ICD-10-CM | POA: Diagnosis not present

## 2020-09-29 DIAGNOSIS — Z8616 Personal history of COVID-19: Secondary | ICD-10-CM | POA: Diagnosis not present

## 2020-09-29 DIAGNOSIS — J449 Chronic obstructive pulmonary disease, unspecified: Secondary | ICD-10-CM | POA: Diagnosis not present

## 2020-09-29 DIAGNOSIS — Z87891 Personal history of nicotine dependence: Secondary | ICD-10-CM | POA: Diagnosis not present

## 2020-09-29 DIAGNOSIS — E1122 Type 2 diabetes mellitus with diabetic chronic kidney disease: Secondary | ICD-10-CM | POA: Diagnosis not present

## 2020-09-29 DIAGNOSIS — K219 Gastro-esophageal reflux disease without esophagitis: Secondary | ICD-10-CM | POA: Diagnosis not present

## 2020-09-29 DIAGNOSIS — M109 Gout, unspecified: Secondary | ICD-10-CM | POA: Diagnosis not present

## 2020-09-29 DIAGNOSIS — N183 Chronic kidney disease, stage 3 unspecified: Secondary | ICD-10-CM | POA: Diagnosis not present

## 2020-09-29 DIAGNOSIS — I130 Hypertensive heart and chronic kidney disease with heart failure and stage 1 through stage 4 chronic kidney disease, or unspecified chronic kidney disease: Secondary | ICD-10-CM | POA: Diagnosis not present

## 2020-09-29 DIAGNOSIS — I2510 Atherosclerotic heart disease of native coronary artery without angina pectoris: Secondary | ICD-10-CM | POA: Diagnosis not present

## 2020-09-29 DIAGNOSIS — I255 Ischemic cardiomyopathy: Secondary | ICD-10-CM | POA: Diagnosis not present

## 2020-09-29 DIAGNOSIS — D631 Anemia in chronic kidney disease: Secondary | ICD-10-CM | POA: Diagnosis not present

## 2020-09-29 DIAGNOSIS — Z7984 Long term (current) use of oral hypoglycemic drugs: Secondary | ICD-10-CM | POA: Diagnosis not present

## 2020-09-29 DIAGNOSIS — E785 Hyperlipidemia, unspecified: Secondary | ICD-10-CM | POA: Diagnosis not present

## 2020-10-02 DIAGNOSIS — M25511 Pain in right shoulder: Secondary | ICD-10-CM | POA: Diagnosis not present

## 2020-10-02 DIAGNOSIS — Z95810 Presence of automatic (implantable) cardiac defibrillator: Secondary | ICD-10-CM | POA: Diagnosis not present

## 2020-10-02 DIAGNOSIS — Z950 Presence of cardiac pacemaker: Secondary | ICD-10-CM | POA: Diagnosis not present

## 2020-10-02 DIAGNOSIS — I5042 Chronic combined systolic (congestive) and diastolic (congestive) heart failure: Secondary | ICD-10-CM | POA: Diagnosis not present

## 2020-10-02 DIAGNOSIS — Z20822 Contact with and (suspected) exposure to covid-19: Secondary | ICD-10-CM | POA: Diagnosis not present

## 2020-10-02 DIAGNOSIS — E8770 Fluid overload, unspecified: Secondary | ICD-10-CM | POA: Diagnosis not present

## 2020-10-02 DIAGNOSIS — Z7401 Bed confinement status: Secondary | ICD-10-CM | POA: Diagnosis not present

## 2020-10-02 DIAGNOSIS — E1122 Type 2 diabetes mellitus with diabetic chronic kidney disease: Secondary | ICD-10-CM | POA: Diagnosis present

## 2020-10-02 DIAGNOSIS — R0989 Other specified symptoms and signs involving the circulatory and respiratory systems: Secondary | ICD-10-CM | POA: Diagnosis not present

## 2020-10-02 DIAGNOSIS — M109 Gout, unspecified: Secondary | ICD-10-CM | POA: Diagnosis present

## 2020-10-02 DIAGNOSIS — R350 Frequency of micturition: Secondary | ICD-10-CM | POA: Diagnosis not present

## 2020-10-02 DIAGNOSIS — I5023 Acute on chronic systolic (congestive) heart failure: Secondary | ICD-10-CM | POA: Diagnosis present

## 2020-10-02 DIAGNOSIS — E785 Hyperlipidemia, unspecified: Secondary | ICD-10-CM | POA: Diagnosis present

## 2020-10-02 DIAGNOSIS — R600 Localized edema: Secondary | ICD-10-CM | POA: Diagnosis not present

## 2020-10-02 DIAGNOSIS — Z8673 Personal history of transient ischemic attack (TIA), and cerebral infarction without residual deficits: Secondary | ICD-10-CM | POA: Diagnosis not present

## 2020-10-02 DIAGNOSIS — R058 Other specified cough: Secondary | ICD-10-CM | POA: Diagnosis not present

## 2020-10-02 DIAGNOSIS — J449 Chronic obstructive pulmonary disease, unspecified: Secondary | ICD-10-CM | POA: Diagnosis present

## 2020-10-02 DIAGNOSIS — K219 Gastro-esophageal reflux disease without esophagitis: Secondary | ICD-10-CM | POA: Diagnosis present

## 2020-10-02 DIAGNOSIS — R0689 Other abnormalities of breathing: Secondary | ICD-10-CM | POA: Diagnosis not present

## 2020-10-02 DIAGNOSIS — K761 Chronic passive congestion of liver: Secondary | ICD-10-CM | POA: Diagnosis present

## 2020-10-02 DIAGNOSIS — N183 Chronic kidney disease, stage 3 unspecified: Secondary | ICD-10-CM | POA: Diagnosis not present

## 2020-10-02 DIAGNOSIS — N184 Chronic kidney disease, stage 4 (severe): Secondary | ICD-10-CM | POA: Diagnosis present

## 2020-10-02 DIAGNOSIS — Z951 Presence of aortocoronary bypass graft: Secondary | ICD-10-CM | POA: Diagnosis not present

## 2020-10-02 DIAGNOSIS — D72819 Decreased white blood cell count, unspecified: Secondary | ICD-10-CM | POA: Diagnosis present

## 2020-10-02 DIAGNOSIS — R059 Cough, unspecified: Secondary | ICD-10-CM | POA: Diagnosis not present

## 2020-10-02 DIAGNOSIS — M6281 Muscle weakness (generalized): Secondary | ICD-10-CM | POA: Diagnosis not present

## 2020-10-02 DIAGNOSIS — D631 Anemia in chronic kidney disease: Secondary | ICD-10-CM | POA: Diagnosis not present

## 2020-10-02 DIAGNOSIS — Z79899 Other long term (current) drug therapy: Secondary | ICD-10-CM | POA: Diagnosis not present

## 2020-10-02 DIAGNOSIS — R531 Weakness: Secondary | ICD-10-CM | POA: Diagnosis not present

## 2020-10-02 DIAGNOSIS — R609 Edema, unspecified: Secondary | ICD-10-CM | POA: Diagnosis not present

## 2020-10-02 DIAGNOSIS — R509 Fever, unspecified: Secondary | ICD-10-CM | POA: Diagnosis not present

## 2020-10-02 DIAGNOSIS — I255 Ischemic cardiomyopathy: Secondary | ICD-10-CM | POA: Diagnosis present

## 2020-10-02 DIAGNOSIS — R32 Unspecified urinary incontinence: Secondary | ICD-10-CM | POA: Diagnosis present

## 2020-10-02 DIAGNOSIS — I130 Hypertensive heart and chronic kidney disease with heart failure and stage 1 through stage 4 chronic kidney disease, or unspecified chronic kidney disease: Secondary | ICD-10-CM | POA: Diagnosis not present

## 2020-10-02 DIAGNOSIS — Z8616 Personal history of COVID-19: Secondary | ICD-10-CM | POA: Diagnosis not present

## 2020-10-02 DIAGNOSIS — I10 Essential (primary) hypertension: Secondary | ICD-10-CM | POA: Diagnosis not present

## 2020-10-02 DIAGNOSIS — R5381 Other malaise: Secondary | ICD-10-CM | POA: Diagnosis not present

## 2020-10-02 DIAGNOSIS — R1900 Intra-abdominal and pelvic swelling, mass and lump, unspecified site: Secondary | ICD-10-CM | POA: Diagnosis not present

## 2020-10-02 DIAGNOSIS — I129 Hypertensive chronic kidney disease with stage 1 through stage 4 chronic kidney disease, or unspecified chronic kidney disease: Secondary | ICD-10-CM | POA: Diagnosis present

## 2020-10-02 DIAGNOSIS — R4702 Dysphasia: Secondary | ICD-10-CM | POA: Diagnosis not present

## 2020-10-02 DIAGNOSIS — I2510 Atherosclerotic heart disease of native coronary artery without angina pectoris: Secondary | ICD-10-CM | POA: Diagnosis present

## 2020-10-02 DIAGNOSIS — R140 Abdominal distension (gaseous): Secondary | ICD-10-CM | POA: Diagnosis not present

## 2020-10-02 DIAGNOSIS — R188 Other ascites: Secondary | ICD-10-CM | POA: Diagnosis present

## 2020-10-02 DIAGNOSIS — I5020 Unspecified systolic (congestive) heart failure: Secondary | ICD-10-CM | POA: Diagnosis not present

## 2020-10-02 DIAGNOSIS — M2550 Pain in unspecified joint: Secondary | ICD-10-CM | POA: Diagnosis not present

## 2020-10-02 DIAGNOSIS — Z87891 Personal history of nicotine dependence: Secondary | ICD-10-CM | POA: Diagnosis not present

## 2020-10-02 DIAGNOSIS — I248 Other forms of acute ischemic heart disease: Secondary | ICD-10-CM | POA: Diagnosis present

## 2020-10-02 DIAGNOSIS — R4182 Altered mental status, unspecified: Secondary | ICD-10-CM | POA: Diagnosis not present

## 2020-10-04 DIAGNOSIS — R350 Frequency of micturition: Secondary | ICD-10-CM | POA: Diagnosis not present

## 2020-10-04 DIAGNOSIS — I5020 Unspecified systolic (congestive) heart failure: Secondary | ICD-10-CM | POA: Diagnosis not present

## 2020-10-04 DIAGNOSIS — R1900 Intra-abdominal and pelvic swelling, mass and lump, unspecified site: Secondary | ICD-10-CM | POA: Diagnosis not present

## 2020-10-04 DIAGNOSIS — M109 Gout, unspecified: Secondary | ICD-10-CM | POA: Diagnosis not present

## 2020-10-04 DIAGNOSIS — N184 Chronic kidney disease, stage 4 (severe): Secondary | ICD-10-CM | POA: Diagnosis not present

## 2020-10-04 DIAGNOSIS — R32 Unspecified urinary incontinence: Secondary | ICD-10-CM | POA: Diagnosis not present

## 2020-10-04 DIAGNOSIS — R600 Localized edema: Secondary | ICD-10-CM | POA: Diagnosis not present

## 2020-10-04 DIAGNOSIS — I5023 Acute on chronic systolic (congestive) heart failure: Secondary | ICD-10-CM | POA: Diagnosis not present

## 2020-10-04 DIAGNOSIS — R058 Other specified cough: Secondary | ICD-10-CM | POA: Diagnosis not present

## 2020-10-04 DIAGNOSIS — E8770 Fluid overload, unspecified: Secondary | ICD-10-CM | POA: Diagnosis not present

## 2020-10-04 DIAGNOSIS — I255 Ischemic cardiomyopathy: Secondary | ICD-10-CM | POA: Diagnosis not present

## 2020-10-04 DIAGNOSIS — R509 Fever, unspecified: Secondary | ICD-10-CM | POA: Diagnosis not present

## 2020-10-05 DIAGNOSIS — E8770 Fluid overload, unspecified: Secondary | ICD-10-CM | POA: Diagnosis not present

## 2020-10-05 DIAGNOSIS — R350 Frequency of micturition: Secondary | ICD-10-CM | POA: Diagnosis not present

## 2020-10-05 DIAGNOSIS — R509 Fever, unspecified: Secondary | ICD-10-CM | POA: Diagnosis not present

## 2020-10-05 DIAGNOSIS — R058 Other specified cough: Secondary | ICD-10-CM | POA: Diagnosis not present

## 2020-10-05 DIAGNOSIS — R1900 Intra-abdominal and pelvic swelling, mass and lump, unspecified site: Secondary | ICD-10-CM | POA: Diagnosis not present

## 2020-10-05 DIAGNOSIS — R600 Localized edema: Secondary | ICD-10-CM | POA: Diagnosis not present

## 2020-10-05 DIAGNOSIS — R32 Unspecified urinary incontinence: Secondary | ICD-10-CM | POA: Diagnosis not present

## 2020-10-05 DIAGNOSIS — M109 Gout, unspecified: Secondary | ICD-10-CM | POA: Diagnosis not present

## 2020-10-05 DIAGNOSIS — I5020 Unspecified systolic (congestive) heart failure: Secondary | ICD-10-CM | POA: Diagnosis not present

## 2020-10-05 DIAGNOSIS — I255 Ischemic cardiomyopathy: Secondary | ICD-10-CM | POA: Diagnosis not present

## 2020-10-05 DIAGNOSIS — N184 Chronic kidney disease, stage 4 (severe): Secondary | ICD-10-CM | POA: Diagnosis not present

## 2020-10-07 DIAGNOSIS — R1900 Intra-abdominal and pelvic swelling, mass and lump, unspecified site: Secondary | ICD-10-CM | POA: Diagnosis not present

## 2020-10-07 DIAGNOSIS — R509 Fever, unspecified: Secondary | ICD-10-CM | POA: Diagnosis not present

## 2020-10-07 DIAGNOSIS — M109 Gout, unspecified: Secondary | ICD-10-CM | POA: Diagnosis not present

## 2020-10-07 DIAGNOSIS — R058 Other specified cough: Secondary | ICD-10-CM | POA: Diagnosis not present

## 2020-10-07 DIAGNOSIS — R32 Unspecified urinary incontinence: Secondary | ICD-10-CM | POA: Diagnosis not present

## 2020-10-07 DIAGNOSIS — E8770 Fluid overload, unspecified: Secondary | ICD-10-CM | POA: Diagnosis not present

## 2020-10-07 DIAGNOSIS — I255 Ischemic cardiomyopathy: Secondary | ICD-10-CM | POA: Diagnosis not present

## 2020-10-07 DIAGNOSIS — I5020 Unspecified systolic (congestive) heart failure: Secondary | ICD-10-CM | POA: Diagnosis not present

## 2020-10-07 DIAGNOSIS — R350 Frequency of micturition: Secondary | ICD-10-CM | POA: Diagnosis not present

## 2020-10-07 DIAGNOSIS — R600 Localized edema: Secondary | ICD-10-CM | POA: Diagnosis not present

## 2020-10-07 DIAGNOSIS — N184 Chronic kidney disease, stage 4 (severe): Secondary | ICD-10-CM | POA: Diagnosis not present

## 2020-10-14 DIAGNOSIS — I130 Hypertensive heart and chronic kidney disease with heart failure and stage 1 through stage 4 chronic kidney disease, or unspecified chronic kidney disease: Secondary | ICD-10-CM | POA: Diagnosis not present

## 2020-10-14 DIAGNOSIS — J449 Chronic obstructive pulmonary disease, unspecified: Secondary | ICD-10-CM | POA: Diagnosis not present

## 2020-10-14 DIAGNOSIS — D631 Anemia in chronic kidney disease: Secondary | ICD-10-CM | POA: Diagnosis not present

## 2020-10-14 DIAGNOSIS — E1122 Type 2 diabetes mellitus with diabetic chronic kidney disease: Secondary | ICD-10-CM | POA: Diagnosis not present

## 2020-10-14 DIAGNOSIS — I5042 Chronic combined systolic (congestive) and diastolic (congestive) heart failure: Secondary | ICD-10-CM | POA: Diagnosis not present

## 2020-10-14 DIAGNOSIS — N183 Chronic kidney disease, stage 3 unspecified: Secondary | ICD-10-CM | POA: Diagnosis not present

## 2020-10-15 ENCOUNTER — Telehealth: Payer: Self-pay | Admitting: Family Medicine

## 2020-10-15 DIAGNOSIS — R188 Other ascites: Secondary | ICD-10-CM | POA: Diagnosis not present

## 2020-10-15 DIAGNOSIS — K7460 Unspecified cirrhosis of liver: Secondary | ICD-10-CM | POA: Diagnosis not present

## 2020-10-15 DIAGNOSIS — I5022 Chronic systolic (congestive) heart failure: Secondary | ICD-10-CM | POA: Diagnosis not present

## 2020-10-15 DIAGNOSIS — Z008 Encounter for other general examination: Secondary | ICD-10-CM | POA: Diagnosis not present

## 2020-10-15 DIAGNOSIS — I130 Hypertensive heart and chronic kidney disease with heart failure and stage 1 through stage 4 chronic kidney disease, or unspecified chronic kidney disease: Secondary | ICD-10-CM | POA: Diagnosis not present

## 2020-10-15 DIAGNOSIS — Z6822 Body mass index (BMI) 22.0-22.9, adult: Secondary | ICD-10-CM | POA: Diagnosis not present

## 2020-10-15 DIAGNOSIS — Z7189 Other specified counseling: Secondary | ICD-10-CM | POA: Diagnosis not present

## 2020-10-15 DIAGNOSIS — E1140 Type 2 diabetes mellitus with diabetic neuropathy, unspecified: Secondary | ICD-10-CM | POA: Diagnosis not present

## 2020-10-15 DIAGNOSIS — N1831 Chronic kidney disease, stage 3a: Secondary | ICD-10-CM | POA: Diagnosis not present

## 2020-10-15 DIAGNOSIS — Z79899 Other long term (current) drug therapy: Secondary | ICD-10-CM | POA: Diagnosis not present

## 2020-10-15 DIAGNOSIS — M109 Gout, unspecified: Secondary | ICD-10-CM | POA: Diagnosis not present

## 2020-10-15 NOTE — Telephone Encounter (Signed)
PCP given paperwork to sign. Currently the patient is no longer a patient of Dr. Nani Ravens.  He was dismissed as of June 21, 2020. However, the initial home health orders were initiated on 06/01/20 when the patient was still a patient of Dr. Serita Sheller.

## 2020-10-15 NOTE — Telephone Encounter (Signed)
Deatra Ina from [facility] dropped off document to be filled out by provider (5 pages [place] [facility] forms) Lattie Haw would like to have document fax when ready at [phone]. Document put at front office tray under providers name.

## 2020-10-16 DIAGNOSIS — D631 Anemia in chronic kidney disease: Secondary | ICD-10-CM | POA: Diagnosis not present

## 2020-10-16 DIAGNOSIS — J449 Chronic obstructive pulmonary disease, unspecified: Secondary | ICD-10-CM | POA: Diagnosis not present

## 2020-10-16 DIAGNOSIS — I130 Hypertensive heart and chronic kidney disease with heart failure and stage 1 through stage 4 chronic kidney disease, or unspecified chronic kidney disease: Secondary | ICD-10-CM | POA: Diagnosis not present

## 2020-10-16 DIAGNOSIS — N183 Chronic kidney disease, stage 3 unspecified: Secondary | ICD-10-CM | POA: Diagnosis not present

## 2020-10-16 DIAGNOSIS — E1122 Type 2 diabetes mellitus with diabetic chronic kidney disease: Secondary | ICD-10-CM | POA: Diagnosis not present

## 2020-10-16 DIAGNOSIS — I5042 Chronic combined systolic (congestive) and diastolic (congestive) heart failure: Secondary | ICD-10-CM | POA: Diagnosis not present

## 2020-10-18 DIAGNOSIS — E1122 Type 2 diabetes mellitus with diabetic chronic kidney disease: Secondary | ICD-10-CM | POA: Diagnosis not present

## 2020-10-18 DIAGNOSIS — I130 Hypertensive heart and chronic kidney disease with heart failure and stage 1 through stage 4 chronic kidney disease, or unspecified chronic kidney disease: Secondary | ICD-10-CM | POA: Diagnosis not present

## 2020-10-18 DIAGNOSIS — I5042 Chronic combined systolic (congestive) and diastolic (congestive) heart failure: Secondary | ICD-10-CM | POA: Diagnosis not present

## 2020-10-18 DIAGNOSIS — J449 Chronic obstructive pulmonary disease, unspecified: Secondary | ICD-10-CM | POA: Diagnosis not present

## 2020-10-18 DIAGNOSIS — N183 Chronic kidney disease, stage 3 unspecified: Secondary | ICD-10-CM | POA: Diagnosis not present

## 2020-10-18 DIAGNOSIS — D631 Anemia in chronic kidney disease: Secondary | ICD-10-CM | POA: Diagnosis not present

## 2020-10-22 DIAGNOSIS — I130 Hypertensive heart and chronic kidney disease with heart failure and stage 1 through stage 4 chronic kidney disease, or unspecified chronic kidney disease: Secondary | ICD-10-CM | POA: Diagnosis not present

## 2020-10-22 DIAGNOSIS — D631 Anemia in chronic kidney disease: Secondary | ICD-10-CM | POA: Diagnosis not present

## 2020-10-22 DIAGNOSIS — J449 Chronic obstructive pulmonary disease, unspecified: Secondary | ICD-10-CM | POA: Diagnosis not present

## 2020-10-22 DIAGNOSIS — N183 Chronic kidney disease, stage 3 unspecified: Secondary | ICD-10-CM | POA: Diagnosis not present

## 2020-10-22 DIAGNOSIS — I5042 Chronic combined systolic (congestive) and diastolic (congestive) heart failure: Secondary | ICD-10-CM | POA: Diagnosis not present

## 2020-10-22 DIAGNOSIS — E1122 Type 2 diabetes mellitus with diabetic chronic kidney disease: Secondary | ICD-10-CM | POA: Diagnosis not present

## 2020-10-24 DIAGNOSIS — D631 Anemia in chronic kidney disease: Secondary | ICD-10-CM | POA: Diagnosis not present

## 2020-10-24 DIAGNOSIS — E1122 Type 2 diabetes mellitus with diabetic chronic kidney disease: Secondary | ICD-10-CM | POA: Diagnosis not present

## 2020-10-24 DIAGNOSIS — I130 Hypertensive heart and chronic kidney disease with heart failure and stage 1 through stage 4 chronic kidney disease, or unspecified chronic kidney disease: Secondary | ICD-10-CM | POA: Diagnosis not present

## 2020-10-24 DIAGNOSIS — I5042 Chronic combined systolic (congestive) and diastolic (congestive) heart failure: Secondary | ICD-10-CM | POA: Diagnosis not present

## 2020-10-24 DIAGNOSIS — J449 Chronic obstructive pulmonary disease, unspecified: Secondary | ICD-10-CM | POA: Diagnosis not present

## 2020-10-24 DIAGNOSIS — N183 Chronic kidney disease, stage 3 unspecified: Secondary | ICD-10-CM | POA: Diagnosis not present

## 2020-10-25 DIAGNOSIS — E1122 Type 2 diabetes mellitus with diabetic chronic kidney disease: Secondary | ICD-10-CM | POA: Diagnosis not present

## 2020-10-25 DIAGNOSIS — J449 Chronic obstructive pulmonary disease, unspecified: Secondary | ICD-10-CM | POA: Diagnosis not present

## 2020-10-25 DIAGNOSIS — I5042 Chronic combined systolic (congestive) and diastolic (congestive) heart failure: Secondary | ICD-10-CM | POA: Diagnosis not present

## 2020-10-25 DIAGNOSIS — N183 Chronic kidney disease, stage 3 unspecified: Secondary | ICD-10-CM | POA: Diagnosis not present

## 2020-10-25 DIAGNOSIS — I130 Hypertensive heart and chronic kidney disease with heart failure and stage 1 through stage 4 chronic kidney disease, or unspecified chronic kidney disease: Secondary | ICD-10-CM | POA: Diagnosis not present

## 2020-10-25 DIAGNOSIS — D631 Anemia in chronic kidney disease: Secondary | ICD-10-CM | POA: Diagnosis not present

## 2020-10-28 DIAGNOSIS — K7460 Unspecified cirrhosis of liver: Secondary | ICD-10-CM | POA: Diagnosis not present

## 2020-10-28 DIAGNOSIS — R188 Other ascites: Secondary | ICD-10-CM | POA: Diagnosis not present

## 2020-10-29 DIAGNOSIS — Z7982 Long term (current) use of aspirin: Secondary | ICD-10-CM | POA: Diagnosis not present

## 2020-10-29 DIAGNOSIS — Z9181 History of falling: Secondary | ICD-10-CM | POA: Diagnosis not present

## 2020-10-29 DIAGNOSIS — I2510 Atherosclerotic heart disease of native coronary artery without angina pectoris: Secondary | ICD-10-CM | POA: Diagnosis not present

## 2020-10-29 DIAGNOSIS — I5023 Acute on chronic systolic (congestive) heart failure: Secondary | ICD-10-CM | POA: Diagnosis not present

## 2020-10-29 DIAGNOSIS — I255 Ischemic cardiomyopathy: Secondary | ICD-10-CM | POA: Diagnosis not present

## 2020-10-29 DIAGNOSIS — I130 Hypertensive heart and chronic kidney disease with heart failure and stage 1 through stage 4 chronic kidney disease, or unspecified chronic kidney disease: Secondary | ICD-10-CM | POA: Diagnosis not present

## 2020-10-29 DIAGNOSIS — D631 Anemia in chronic kidney disease: Secondary | ICD-10-CM | POA: Diagnosis not present

## 2020-10-29 DIAGNOSIS — K219 Gastro-esophageal reflux disease without esophagitis: Secondary | ICD-10-CM | POA: Diagnosis not present

## 2020-10-29 DIAGNOSIS — E785 Hyperlipidemia, unspecified: Secondary | ICD-10-CM | POA: Diagnosis not present

## 2020-10-29 DIAGNOSIS — M109 Gout, unspecified: Secondary | ICD-10-CM | POA: Diagnosis not present

## 2020-10-29 DIAGNOSIS — N183 Chronic kidney disease, stage 3 unspecified: Secondary | ICD-10-CM | POA: Diagnosis not present

## 2020-10-29 DIAGNOSIS — J449 Chronic obstructive pulmonary disease, unspecified: Secondary | ICD-10-CM | POA: Diagnosis not present

## 2020-10-29 DIAGNOSIS — M1990 Unspecified osteoarthritis, unspecified site: Secondary | ICD-10-CM | POA: Diagnosis not present

## 2020-10-29 DIAGNOSIS — Z95810 Presence of automatic (implantable) cardiac defibrillator: Secondary | ICD-10-CM | POA: Diagnosis not present

## 2020-10-29 DIAGNOSIS — Z951 Presence of aortocoronary bypass graft: Secondary | ICD-10-CM | POA: Diagnosis not present

## 2020-10-29 DIAGNOSIS — Z7984 Long term (current) use of oral hypoglycemic drugs: Secondary | ICD-10-CM | POA: Diagnosis not present

## 2020-10-29 DIAGNOSIS — E1122 Type 2 diabetes mellitus with diabetic chronic kidney disease: Secondary | ICD-10-CM | POA: Diagnosis not present

## 2020-10-29 DIAGNOSIS — Z87891 Personal history of nicotine dependence: Secondary | ICD-10-CM | POA: Diagnosis not present

## 2020-10-30 DIAGNOSIS — D631 Anemia in chronic kidney disease: Secondary | ICD-10-CM | POA: Diagnosis not present

## 2020-10-30 DIAGNOSIS — N183 Chronic kidney disease, stage 3 unspecified: Secondary | ICD-10-CM | POA: Diagnosis not present

## 2020-10-30 DIAGNOSIS — E1122 Type 2 diabetes mellitus with diabetic chronic kidney disease: Secondary | ICD-10-CM | POA: Diagnosis not present

## 2020-10-30 DIAGNOSIS — I130 Hypertensive heart and chronic kidney disease with heart failure and stage 1 through stage 4 chronic kidney disease, or unspecified chronic kidney disease: Secondary | ICD-10-CM | POA: Diagnosis not present

## 2020-10-30 DIAGNOSIS — I5023 Acute on chronic systolic (congestive) heart failure: Secondary | ICD-10-CM | POA: Diagnosis not present

## 2020-10-30 DIAGNOSIS — M109 Gout, unspecified: Secondary | ICD-10-CM | POA: Diagnosis not present

## 2020-10-31 DIAGNOSIS — I130 Hypertensive heart and chronic kidney disease with heart failure and stage 1 through stage 4 chronic kidney disease, or unspecified chronic kidney disease: Secondary | ICD-10-CM | POA: Diagnosis not present

## 2020-10-31 DIAGNOSIS — I5023 Acute on chronic systolic (congestive) heart failure: Secondary | ICD-10-CM | POA: Diagnosis not present

## 2020-10-31 DIAGNOSIS — N183 Chronic kidney disease, stage 3 unspecified: Secondary | ICD-10-CM | POA: Diagnosis not present

## 2020-10-31 DIAGNOSIS — M109 Gout, unspecified: Secondary | ICD-10-CM | POA: Diagnosis not present

## 2020-10-31 DIAGNOSIS — E1122 Type 2 diabetes mellitus with diabetic chronic kidney disease: Secondary | ICD-10-CM | POA: Diagnosis not present

## 2020-10-31 DIAGNOSIS — D631 Anemia in chronic kidney disease: Secondary | ICD-10-CM | POA: Diagnosis not present

## 2020-11-01 DIAGNOSIS — Z008 Encounter for other general examination: Secondary | ICD-10-CM | POA: Diagnosis not present

## 2020-11-01 DIAGNOSIS — N1831 Chronic kidney disease, stage 3a: Secondary | ICD-10-CM | POA: Diagnosis not present

## 2020-11-01 DIAGNOSIS — I130 Hypertensive heart and chronic kidney disease with heart failure and stage 1 through stage 4 chronic kidney disease, or unspecified chronic kidney disease: Secondary | ICD-10-CM | POA: Diagnosis not present

## 2020-11-01 DIAGNOSIS — Z6822 Body mass index (BMI) 22.0-22.9, adult: Secondary | ICD-10-CM | POA: Diagnosis not present

## 2020-11-01 DIAGNOSIS — R188 Other ascites: Secondary | ICD-10-CM | POA: Diagnosis not present

## 2020-11-01 DIAGNOSIS — K7460 Unspecified cirrhosis of liver: Secondary | ICD-10-CM | POA: Diagnosis not present

## 2020-11-01 DIAGNOSIS — I5022 Chronic systolic (congestive) heart failure: Secondary | ICD-10-CM | POA: Diagnosis not present

## 2020-11-01 DIAGNOSIS — I5042 Chronic combined systolic (congestive) and diastolic (congestive) heart failure: Secondary | ICD-10-CM | POA: Diagnosis not present

## 2020-11-01 DIAGNOSIS — E46 Unspecified protein-calorie malnutrition: Secondary | ICD-10-CM | POA: Diagnosis not present

## 2020-11-05 DIAGNOSIS — D631 Anemia in chronic kidney disease: Secondary | ICD-10-CM | POA: Diagnosis not present

## 2020-11-05 DIAGNOSIS — I130 Hypertensive heart and chronic kidney disease with heart failure and stage 1 through stage 4 chronic kidney disease, or unspecified chronic kidney disease: Secondary | ICD-10-CM | POA: Diagnosis not present

## 2020-11-05 DIAGNOSIS — E1122 Type 2 diabetes mellitus with diabetic chronic kidney disease: Secondary | ICD-10-CM | POA: Diagnosis not present

## 2020-11-05 DIAGNOSIS — M109 Gout, unspecified: Secondary | ICD-10-CM | POA: Diagnosis not present

## 2020-11-05 DIAGNOSIS — N183 Chronic kidney disease, stage 3 unspecified: Secondary | ICD-10-CM | POA: Diagnosis not present

## 2020-11-05 DIAGNOSIS — I5023 Acute on chronic systolic (congestive) heart failure: Secondary | ICD-10-CM | POA: Diagnosis not present

## 2020-11-07 DIAGNOSIS — I5023 Acute on chronic systolic (congestive) heart failure: Secondary | ICD-10-CM | POA: Diagnosis not present

## 2020-11-07 DIAGNOSIS — Z008 Encounter for other general examination: Secondary | ICD-10-CM | POA: Diagnosis not present

## 2020-11-07 DIAGNOSIS — N183 Chronic kidney disease, stage 3 unspecified: Secondary | ICD-10-CM | POA: Diagnosis not present

## 2020-11-07 DIAGNOSIS — Z79899 Other long term (current) drug therapy: Secondary | ICD-10-CM | POA: Diagnosis not present

## 2020-11-07 DIAGNOSIS — J449 Chronic obstructive pulmonary disease, unspecified: Secondary | ICD-10-CM | POA: Diagnosis not present

## 2020-11-07 DIAGNOSIS — K7460 Unspecified cirrhosis of liver: Secondary | ICD-10-CM | POA: Diagnosis not present

## 2020-11-07 DIAGNOSIS — D631 Anemia in chronic kidney disease: Secondary | ICD-10-CM | POA: Diagnosis not present

## 2020-11-07 DIAGNOSIS — I130 Hypertensive heart and chronic kidney disease with heart failure and stage 1 through stage 4 chronic kidney disease, or unspecified chronic kidney disease: Secondary | ICD-10-CM | POA: Diagnosis not present

## 2020-11-07 DIAGNOSIS — M109 Gout, unspecified: Secondary | ICD-10-CM | POA: Diagnosis not present

## 2020-11-07 DIAGNOSIS — E1122 Type 2 diabetes mellitus with diabetic chronic kidney disease: Secondary | ICD-10-CM | POA: Diagnosis not present

## 2020-11-07 DIAGNOSIS — E1140 Type 2 diabetes mellitus with diabetic neuropathy, unspecified: Secondary | ICD-10-CM | POA: Diagnosis not present

## 2020-11-07 DIAGNOSIS — R188 Other ascites: Secondary | ICD-10-CM | POA: Diagnosis not present

## 2020-11-14 DIAGNOSIS — I1310 Hypertensive heart and chronic kidney disease without heart failure, with stage 1 through stage 4 chronic kidney disease, or unspecified chronic kidney disease: Secondary | ICD-10-CM | POA: Diagnosis not present

## 2020-11-14 DIAGNOSIS — M9080 Osteopathy in diseases classified elsewhere, unspecified site: Secondary | ICD-10-CM | POA: Diagnosis not present

## 2020-11-14 DIAGNOSIS — E875 Hyperkalemia: Secondary | ICD-10-CM | POA: Diagnosis not present

## 2020-11-14 DIAGNOSIS — N189 Chronic kidney disease, unspecified: Secondary | ICD-10-CM | POA: Diagnosis not present

## 2020-11-14 DIAGNOSIS — N1832 Chronic kidney disease, stage 3b: Secondary | ICD-10-CM | POA: Diagnosis not present

## 2020-11-14 DIAGNOSIS — N179 Acute kidney failure, unspecified: Secondary | ICD-10-CM | POA: Diagnosis not present

## 2020-11-14 DIAGNOSIS — D631 Anemia in chronic kidney disease: Secondary | ICD-10-CM | POA: Diagnosis not present

## 2020-11-14 DIAGNOSIS — N183 Chronic kidney disease, stage 3 unspecified: Secondary | ICD-10-CM | POA: Diagnosis not present

## 2020-11-14 DIAGNOSIS — E889 Metabolic disorder, unspecified: Secondary | ICD-10-CM | POA: Diagnosis not present

## 2020-11-14 DIAGNOSIS — E559 Vitamin D deficiency, unspecified: Secondary | ICD-10-CM | POA: Diagnosis not present

## 2020-11-15 DIAGNOSIS — I130 Hypertensive heart and chronic kidney disease with heart failure and stage 1 through stage 4 chronic kidney disease, or unspecified chronic kidney disease: Secondary | ICD-10-CM | POA: Diagnosis not present

## 2020-11-15 DIAGNOSIS — N183 Chronic kidney disease, stage 3 unspecified: Secondary | ICD-10-CM | POA: Diagnosis not present

## 2020-11-15 DIAGNOSIS — D631 Anemia in chronic kidney disease: Secondary | ICD-10-CM | POA: Diagnosis not present

## 2020-11-15 DIAGNOSIS — I5023 Acute on chronic systolic (congestive) heart failure: Secondary | ICD-10-CM | POA: Diagnosis not present

## 2020-11-15 DIAGNOSIS — E1122 Type 2 diabetes mellitus with diabetic chronic kidney disease: Secondary | ICD-10-CM | POA: Diagnosis not present

## 2020-11-15 DIAGNOSIS — M109 Gout, unspecified: Secondary | ICD-10-CM | POA: Diagnosis not present

## 2020-11-18 DIAGNOSIS — E1122 Type 2 diabetes mellitus with diabetic chronic kidney disease: Secondary | ICD-10-CM | POA: Diagnosis not present

## 2020-11-18 DIAGNOSIS — I130 Hypertensive heart and chronic kidney disease with heart failure and stage 1 through stage 4 chronic kidney disease, or unspecified chronic kidney disease: Secondary | ICD-10-CM | POA: Diagnosis not present

## 2020-11-18 DIAGNOSIS — D631 Anemia in chronic kidney disease: Secondary | ICD-10-CM | POA: Diagnosis not present

## 2020-11-18 DIAGNOSIS — M109 Gout, unspecified: Secondary | ICD-10-CM | POA: Diagnosis not present

## 2020-11-18 DIAGNOSIS — N183 Chronic kidney disease, stage 3 unspecified: Secondary | ICD-10-CM | POA: Diagnosis not present

## 2020-11-18 DIAGNOSIS — I5023 Acute on chronic systolic (congestive) heart failure: Secondary | ICD-10-CM | POA: Diagnosis not present

## 2020-11-25 DIAGNOSIS — I5023 Acute on chronic systolic (congestive) heart failure: Secondary | ICD-10-CM | POA: Diagnosis not present

## 2020-11-25 DIAGNOSIS — N183 Chronic kidney disease, stage 3 unspecified: Secondary | ICD-10-CM | POA: Diagnosis not present

## 2020-11-25 DIAGNOSIS — M109 Gout, unspecified: Secondary | ICD-10-CM | POA: Diagnosis not present

## 2020-11-25 DIAGNOSIS — I130 Hypertensive heart and chronic kidney disease with heart failure and stage 1 through stage 4 chronic kidney disease, or unspecified chronic kidney disease: Secondary | ICD-10-CM | POA: Diagnosis not present

## 2020-11-25 DIAGNOSIS — E1122 Type 2 diabetes mellitus with diabetic chronic kidney disease: Secondary | ICD-10-CM | POA: Diagnosis not present

## 2020-11-25 DIAGNOSIS — D631 Anemia in chronic kidney disease: Secondary | ICD-10-CM | POA: Diagnosis not present

## 2020-11-26 DIAGNOSIS — N183 Chronic kidney disease, stage 3 unspecified: Secondary | ICD-10-CM | POA: Diagnosis not present

## 2020-11-26 DIAGNOSIS — E1122 Type 2 diabetes mellitus with diabetic chronic kidney disease: Secondary | ICD-10-CM | POA: Diagnosis not present

## 2020-11-26 DIAGNOSIS — M109 Gout, unspecified: Secondary | ICD-10-CM | POA: Diagnosis not present

## 2020-11-26 DIAGNOSIS — I130 Hypertensive heart and chronic kidney disease with heart failure and stage 1 through stage 4 chronic kidney disease, or unspecified chronic kidney disease: Secondary | ICD-10-CM | POA: Diagnosis not present

## 2020-11-26 DIAGNOSIS — D631 Anemia in chronic kidney disease: Secondary | ICD-10-CM | POA: Diagnosis not present

## 2020-11-26 DIAGNOSIS — I5023 Acute on chronic systolic (congestive) heart failure: Secondary | ICD-10-CM | POA: Diagnosis not present

## 2020-11-28 DIAGNOSIS — I2510 Atherosclerotic heart disease of native coronary artery without angina pectoris: Secondary | ICD-10-CM | POA: Diagnosis not present

## 2020-11-28 DIAGNOSIS — Z6824 Body mass index (BMI) 24.0-24.9, adult: Secondary | ICD-10-CM | POA: Diagnosis not present

## 2020-11-28 DIAGNOSIS — Z7951 Long term (current) use of inhaled steroids: Secondary | ICD-10-CM | POA: Diagnosis not present

## 2020-11-28 DIAGNOSIS — Z008 Encounter for other general examination: Secondary | ICD-10-CM | POA: Diagnosis not present

## 2020-11-28 DIAGNOSIS — Z87891 Personal history of nicotine dependence: Secondary | ICD-10-CM | POA: Diagnosis not present

## 2020-11-28 DIAGNOSIS — K219 Gastro-esophageal reflux disease without esophagitis: Secondary | ICD-10-CM | POA: Diagnosis not present

## 2020-11-28 DIAGNOSIS — M25519 Pain in unspecified shoulder: Secondary | ICD-10-CM | POA: Diagnosis not present

## 2020-11-28 DIAGNOSIS — I5023 Acute on chronic systolic (congestive) heart failure: Secondary | ICD-10-CM | POA: Diagnosis not present

## 2020-11-28 DIAGNOSIS — Z7952 Long term (current) use of systemic steroids: Secondary | ICD-10-CM | POA: Diagnosis not present

## 2020-11-28 DIAGNOSIS — M109 Gout, unspecified: Secondary | ICD-10-CM | POA: Diagnosis not present

## 2020-11-28 DIAGNOSIS — E1122 Type 2 diabetes mellitus with diabetic chronic kidney disease: Secondary | ICD-10-CM | POA: Diagnosis not present

## 2020-11-28 DIAGNOSIS — Z95810 Presence of automatic (implantable) cardiac defibrillator: Secondary | ICD-10-CM | POA: Diagnosis not present

## 2020-11-28 DIAGNOSIS — Z951 Presence of aortocoronary bypass graft: Secondary | ICD-10-CM | POA: Diagnosis not present

## 2020-11-28 DIAGNOSIS — R188 Other ascites: Secondary | ICD-10-CM | POA: Diagnosis not present

## 2020-11-28 DIAGNOSIS — I5022 Chronic systolic (congestive) heart failure: Secondary | ICD-10-CM | POA: Diagnosis not present

## 2020-11-28 DIAGNOSIS — E785 Hyperlipidemia, unspecified: Secondary | ICD-10-CM | POA: Diagnosis not present

## 2020-11-28 DIAGNOSIS — I130 Hypertensive heart and chronic kidney disease with heart failure and stage 1 through stage 4 chronic kidney disease, or unspecified chronic kidney disease: Secondary | ICD-10-CM | POA: Diagnosis not present

## 2020-11-28 DIAGNOSIS — Z9181 History of falling: Secondary | ICD-10-CM | POA: Diagnosis not present

## 2020-11-28 DIAGNOSIS — J449 Chronic obstructive pulmonary disease, unspecified: Secondary | ICD-10-CM | POA: Diagnosis not present

## 2020-11-28 DIAGNOSIS — N183 Chronic kidney disease, stage 3 unspecified: Secondary | ICD-10-CM | POA: Diagnosis not present

## 2020-11-28 DIAGNOSIS — M1990 Unspecified osteoarthritis, unspecified site: Secondary | ICD-10-CM | POA: Diagnosis not present

## 2020-11-28 DIAGNOSIS — K7460 Unspecified cirrhosis of liver: Secondary | ICD-10-CM | POA: Diagnosis not present

## 2020-11-28 DIAGNOSIS — I255 Ischemic cardiomyopathy: Secondary | ICD-10-CM | POA: Diagnosis not present

## 2020-11-28 DIAGNOSIS — Z79899 Other long term (current) drug therapy: Secondary | ICD-10-CM | POA: Diagnosis not present

## 2020-11-28 DIAGNOSIS — N1831 Chronic kidney disease, stage 3a: Secondary | ICD-10-CM | POA: Diagnosis not present

## 2020-11-28 DIAGNOSIS — D631 Anemia in chronic kidney disease: Secondary | ICD-10-CM | POA: Diagnosis not present

## 2020-11-28 DIAGNOSIS — Z7984 Long term (current) use of oral hypoglycemic drugs: Secondary | ICD-10-CM | POA: Diagnosis not present

## 2020-11-28 DIAGNOSIS — Z7982 Long term (current) use of aspirin: Secondary | ICD-10-CM | POA: Diagnosis not present

## 2020-12-02 ENCOUNTER — Other Ambulatory Visit: Payer: Self-pay | Admitting: Family Medicine

## 2020-12-02 DIAGNOSIS — M792 Neuralgia and neuritis, unspecified: Secondary | ICD-10-CM

## 2020-12-03 DIAGNOSIS — N183 Chronic kidney disease, stage 3 unspecified: Secondary | ICD-10-CM | POA: Diagnosis not present

## 2020-12-03 DIAGNOSIS — D631 Anemia in chronic kidney disease: Secondary | ICD-10-CM | POA: Diagnosis not present

## 2020-12-03 DIAGNOSIS — E1122 Type 2 diabetes mellitus with diabetic chronic kidney disease: Secondary | ICD-10-CM | POA: Diagnosis not present

## 2020-12-03 DIAGNOSIS — J449 Chronic obstructive pulmonary disease, unspecified: Secondary | ICD-10-CM | POA: Diagnosis not present

## 2020-12-03 DIAGNOSIS — I5023 Acute on chronic systolic (congestive) heart failure: Secondary | ICD-10-CM | POA: Diagnosis not present

## 2020-12-03 DIAGNOSIS — I130 Hypertensive heart and chronic kidney disease with heart failure and stage 1 through stage 4 chronic kidney disease, or unspecified chronic kidney disease: Secondary | ICD-10-CM | POA: Diagnosis not present

## 2020-12-05 DIAGNOSIS — I130 Hypertensive heart and chronic kidney disease with heart failure and stage 1 through stage 4 chronic kidney disease, or unspecified chronic kidney disease: Secondary | ICD-10-CM | POA: Diagnosis not present

## 2020-12-05 DIAGNOSIS — E1122 Type 2 diabetes mellitus with diabetic chronic kidney disease: Secondary | ICD-10-CM | POA: Diagnosis not present

## 2020-12-05 DIAGNOSIS — D631 Anemia in chronic kidney disease: Secondary | ICD-10-CM | POA: Diagnosis not present

## 2020-12-05 DIAGNOSIS — N183 Chronic kidney disease, stage 3 unspecified: Secondary | ICD-10-CM | POA: Diagnosis not present

## 2020-12-05 DIAGNOSIS — I5023 Acute on chronic systolic (congestive) heart failure: Secondary | ICD-10-CM | POA: Diagnosis not present

## 2020-12-05 DIAGNOSIS — J449 Chronic obstructive pulmonary disease, unspecified: Secondary | ICD-10-CM | POA: Diagnosis not present

## 2020-12-09 DIAGNOSIS — E1122 Type 2 diabetes mellitus with diabetic chronic kidney disease: Secondary | ICD-10-CM | POA: Diagnosis not present

## 2020-12-09 DIAGNOSIS — N183 Chronic kidney disease, stage 3 unspecified: Secondary | ICD-10-CM | POA: Diagnosis not present

## 2020-12-09 DIAGNOSIS — D631 Anemia in chronic kidney disease: Secondary | ICD-10-CM | POA: Diagnosis not present

## 2020-12-09 DIAGNOSIS — J449 Chronic obstructive pulmonary disease, unspecified: Secondary | ICD-10-CM | POA: Diagnosis not present

## 2020-12-09 DIAGNOSIS — I5023 Acute on chronic systolic (congestive) heart failure: Secondary | ICD-10-CM | POA: Diagnosis not present

## 2020-12-09 DIAGNOSIS — I130 Hypertensive heart and chronic kidney disease with heart failure and stage 1 through stage 4 chronic kidney disease, or unspecified chronic kidney disease: Secondary | ICD-10-CM | POA: Diagnosis not present

## 2020-12-14 DIAGNOSIS — N183 Chronic kidney disease, stage 3 unspecified: Secondary | ICD-10-CM | POA: Diagnosis not present

## 2020-12-14 DIAGNOSIS — K219 Gastro-esophageal reflux disease without esophagitis: Secondary | ICD-10-CM | POA: Diagnosis not present

## 2020-12-14 DIAGNOSIS — K761 Chronic passive congestion of liver: Secondary | ICD-10-CM | POA: Diagnosis not present

## 2020-12-14 DIAGNOSIS — Z95810 Presence of automatic (implantable) cardiac defibrillator: Secondary | ICD-10-CM | POA: Diagnosis not present

## 2020-12-14 DIAGNOSIS — Z951 Presence of aortocoronary bypass graft: Secondary | ICD-10-CM | POA: Diagnosis not present

## 2020-12-14 DIAGNOSIS — J449 Chronic obstructive pulmonary disease, unspecified: Secondary | ICD-10-CM | POA: Diagnosis not present

## 2020-12-14 DIAGNOSIS — Z79899 Other long term (current) drug therapy: Secondary | ICD-10-CM | POA: Diagnosis not present

## 2020-12-14 DIAGNOSIS — D649 Anemia, unspecified: Secondary | ICD-10-CM | POA: Diagnosis not present

## 2020-12-14 DIAGNOSIS — E8770 Fluid overload, unspecified: Secondary | ICD-10-CM | POA: Diagnosis not present

## 2020-12-14 DIAGNOSIS — I130 Hypertensive heart and chronic kidney disease with heart failure and stage 1 through stage 4 chronic kidney disease, or unspecified chronic kidney disease: Secondary | ICD-10-CM | POA: Diagnosis not present

## 2020-12-14 DIAGNOSIS — I110 Hypertensive heart disease with heart failure: Secondary | ICD-10-CM | POA: Diagnosis not present

## 2020-12-14 DIAGNOSIS — E785 Hyperlipidemia, unspecified: Secondary | ICD-10-CM | POA: Diagnosis not present

## 2020-12-14 DIAGNOSIS — I2510 Atherosclerotic heart disease of native coronary artery without angina pectoris: Secondary | ICD-10-CM | POA: Diagnosis not present

## 2020-12-14 DIAGNOSIS — I10 Essential (primary) hypertension: Secondary | ICD-10-CM | POA: Diagnosis not present

## 2020-12-14 DIAGNOSIS — I255 Ischemic cardiomyopathy: Secondary | ICD-10-CM | POA: Diagnosis not present

## 2020-12-14 DIAGNOSIS — I509 Heart failure, unspecified: Secondary | ICD-10-CM | POA: Diagnosis not present

## 2020-12-14 DIAGNOSIS — E1122 Type 2 diabetes mellitus with diabetic chronic kidney disease: Secondary | ICD-10-CM | POA: Diagnosis not present

## 2020-12-14 DIAGNOSIS — M109 Gout, unspecified: Secondary | ICD-10-CM | POA: Diagnosis not present

## 2020-12-14 DIAGNOSIS — R609 Edema, unspecified: Secondary | ICD-10-CM | POA: Diagnosis not present

## 2020-12-14 DIAGNOSIS — R188 Other ascites: Secondary | ICD-10-CM | POA: Diagnosis not present

## 2020-12-14 DIAGNOSIS — I5023 Acute on chronic systolic (congestive) heart failure: Secondary | ICD-10-CM | POA: Diagnosis not present

## 2020-12-14 DIAGNOSIS — D72819 Decreased white blood cell count, unspecified: Secondary | ICD-10-CM | POA: Diagnosis not present

## 2020-12-14 DIAGNOSIS — Z7982 Long term (current) use of aspirin: Secondary | ICD-10-CM | POA: Diagnosis not present

## 2020-12-14 DIAGNOSIS — R0602 Shortness of breath: Secondary | ICD-10-CM | POA: Diagnosis not present

## 2020-12-15 DIAGNOSIS — R188 Other ascites: Secondary | ICD-10-CM | POA: Diagnosis present

## 2020-12-15 DIAGNOSIS — Z79899 Other long term (current) drug therapy: Secondary | ICD-10-CM | POA: Diagnosis not present

## 2020-12-15 DIAGNOSIS — D72819 Decreased white blood cell count, unspecified: Secondary | ICD-10-CM | POA: Diagnosis present

## 2020-12-15 DIAGNOSIS — K761 Chronic passive congestion of liver: Secondary | ICD-10-CM | POA: Diagnosis present

## 2020-12-15 DIAGNOSIS — Z951 Presence of aortocoronary bypass graft: Secondary | ICD-10-CM | POA: Diagnosis not present

## 2020-12-15 DIAGNOSIS — K219 Gastro-esophageal reflux disease without esophagitis: Secondary | ICD-10-CM | POA: Diagnosis present

## 2020-12-15 DIAGNOSIS — I255 Ischemic cardiomyopathy: Secondary | ICD-10-CM | POA: Diagnosis present

## 2020-12-15 DIAGNOSIS — J449 Chronic obstructive pulmonary disease, unspecified: Secondary | ICD-10-CM | POA: Diagnosis present

## 2020-12-15 DIAGNOSIS — E1122 Type 2 diabetes mellitus with diabetic chronic kidney disease: Secondary | ICD-10-CM | POA: Diagnosis present

## 2020-12-15 DIAGNOSIS — Z7982 Long term (current) use of aspirin: Secondary | ICD-10-CM | POA: Diagnosis not present

## 2020-12-15 DIAGNOSIS — I130 Hypertensive heart and chronic kidney disease with heart failure and stage 1 through stage 4 chronic kidney disease, or unspecified chronic kidney disease: Secondary | ICD-10-CM | POA: Diagnosis present

## 2020-12-15 DIAGNOSIS — E785 Hyperlipidemia, unspecified: Secondary | ICD-10-CM | POA: Diagnosis present

## 2020-12-15 DIAGNOSIS — I5023 Acute on chronic systolic (congestive) heart failure: Secondary | ICD-10-CM | POA: Diagnosis present

## 2020-12-15 DIAGNOSIS — N183 Chronic kidney disease, stage 3 unspecified: Secondary | ICD-10-CM | POA: Diagnosis present

## 2020-12-15 DIAGNOSIS — M109 Gout, unspecified: Secondary | ICD-10-CM | POA: Diagnosis present

## 2020-12-15 DIAGNOSIS — Z95810 Presence of automatic (implantable) cardiac defibrillator: Secondary | ICD-10-CM | POA: Diagnosis not present

## 2020-12-15 DIAGNOSIS — D649 Anemia, unspecified: Secondary | ICD-10-CM | POA: Diagnosis present

## 2020-12-15 DIAGNOSIS — R0602 Shortness of breath: Secondary | ICD-10-CM | POA: Diagnosis not present

## 2020-12-15 DIAGNOSIS — I2510 Atherosclerotic heart disease of native coronary artery without angina pectoris: Secondary | ICD-10-CM | POA: Diagnosis present

## 2020-12-15 DIAGNOSIS — I509 Heart failure, unspecified: Secondary | ICD-10-CM | POA: Diagnosis not present

## 2020-12-17 DIAGNOSIS — I5023 Acute on chronic systolic (congestive) heart failure: Secondary | ICD-10-CM | POA: Diagnosis not present

## 2020-12-17 DIAGNOSIS — N183 Chronic kidney disease, stage 3 unspecified: Secondary | ICD-10-CM | POA: Diagnosis not present

## 2020-12-17 DIAGNOSIS — J449 Chronic obstructive pulmonary disease, unspecified: Secondary | ICD-10-CM | POA: Diagnosis not present

## 2020-12-17 DIAGNOSIS — E1122 Type 2 diabetes mellitus with diabetic chronic kidney disease: Secondary | ICD-10-CM | POA: Diagnosis not present

## 2020-12-17 DIAGNOSIS — I130 Hypertensive heart and chronic kidney disease with heart failure and stage 1 through stage 4 chronic kidney disease, or unspecified chronic kidney disease: Secondary | ICD-10-CM | POA: Diagnosis not present

## 2020-12-17 DIAGNOSIS — D631 Anemia in chronic kidney disease: Secondary | ICD-10-CM | POA: Diagnosis not present

## 2020-12-19 DIAGNOSIS — K7460 Unspecified cirrhosis of liver: Secondary | ICD-10-CM | POA: Diagnosis not present

## 2020-12-19 DIAGNOSIS — R188 Other ascites: Secondary | ICD-10-CM | POA: Diagnosis not present

## 2020-12-19 DIAGNOSIS — R011 Cardiac murmur, unspecified: Secondary | ICD-10-CM | POA: Diagnosis not present

## 2020-12-19 DIAGNOSIS — E663 Overweight: Secondary | ICD-10-CM | POA: Diagnosis not present

## 2020-12-19 DIAGNOSIS — N1831 Chronic kidney disease, stage 3a: Secondary | ICD-10-CM | POA: Diagnosis not present

## 2020-12-19 DIAGNOSIS — I5022 Chronic systolic (congestive) heart failure: Secondary | ICD-10-CM | POA: Diagnosis not present

## 2020-12-19 DIAGNOSIS — I5042 Chronic combined systolic (congestive) and diastolic (congestive) heart failure: Secondary | ICD-10-CM | POA: Diagnosis not present

## 2020-12-19 DIAGNOSIS — I130 Hypertensive heart and chronic kidney disease with heart failure and stage 1 through stage 4 chronic kidney disease, or unspecified chronic kidney disease: Secondary | ICD-10-CM | POA: Diagnosis not present

## 2020-12-19 DIAGNOSIS — Z79899 Other long term (current) drug therapy: Secondary | ICD-10-CM | POA: Diagnosis not present

## 2020-12-19 DIAGNOSIS — Z6825 Body mass index (BMI) 25.0-25.9, adult: Secondary | ICD-10-CM | POA: Diagnosis not present

## 2020-12-19 DIAGNOSIS — Z008 Encounter for other general examination: Secondary | ICD-10-CM | POA: Diagnosis not present

## 2020-12-19 DIAGNOSIS — Z23 Encounter for immunization: Secondary | ICD-10-CM | POA: Diagnosis not present

## 2020-12-22 DIAGNOSIS — N179 Acute kidney failure, unspecified: Secondary | ICD-10-CM | POA: Diagnosis not present

## 2020-12-22 DIAGNOSIS — N189 Chronic kidney disease, unspecified: Secondary | ICD-10-CM | POA: Diagnosis not present

## 2020-12-22 DIAGNOSIS — R609 Edema, unspecified: Secondary | ICD-10-CM | POA: Diagnosis not present

## 2020-12-22 DIAGNOSIS — D631 Anemia in chronic kidney disease: Secondary | ICD-10-CM | POA: Diagnosis not present

## 2020-12-22 DIAGNOSIS — N183 Chronic kidney disease, stage 3 unspecified: Secondary | ICD-10-CM | POA: Diagnosis not present

## 2020-12-22 DIAGNOSIS — E1122 Type 2 diabetes mellitus with diabetic chronic kidney disease: Secondary | ICD-10-CM | POA: Diagnosis not present

## 2020-12-22 DIAGNOSIS — Z950 Presence of cardiac pacemaker: Secondary | ICD-10-CM | POA: Diagnosis not present

## 2020-12-22 DIAGNOSIS — I110 Hypertensive heart disease with heart failure: Secondary | ICD-10-CM | POA: Diagnosis not present

## 2020-12-22 DIAGNOSIS — I5023 Acute on chronic systolic (congestive) heart failure: Secondary | ICD-10-CM | POA: Diagnosis not present

## 2020-12-22 DIAGNOSIS — R1900 Intra-abdominal and pelvic swelling, mass and lump, unspecified site: Secondary | ICD-10-CM | POA: Diagnosis not present

## 2020-12-22 DIAGNOSIS — Z87891 Personal history of nicotine dependence: Secondary | ICD-10-CM | POA: Diagnosis not present

## 2020-12-22 DIAGNOSIS — R0602 Shortness of breath: Secondary | ICD-10-CM | POA: Diagnosis not present

## 2020-12-23 DIAGNOSIS — I5023 Acute on chronic systolic (congestive) heart failure: Secondary | ICD-10-CM | POA: Diagnosis not present

## 2020-12-23 DIAGNOSIS — R188 Other ascites: Secondary | ICD-10-CM | POA: Diagnosis not present

## 2020-12-23 DIAGNOSIS — I255 Ischemic cardiomyopathy: Secondary | ICD-10-CM | POA: Diagnosis not present

## 2020-12-23 DIAGNOSIS — Z951 Presence of aortocoronary bypass graft: Secondary | ICD-10-CM | POA: Diagnosis not present

## 2020-12-23 DIAGNOSIS — N189 Chronic kidney disease, unspecified: Secondary | ICD-10-CM | POA: Diagnosis not present

## 2020-12-23 DIAGNOSIS — E785 Hyperlipidemia, unspecified: Secondary | ICD-10-CM | POA: Diagnosis not present

## 2020-12-23 DIAGNOSIS — I2510 Atherosclerotic heart disease of native coronary artery without angina pectoris: Secondary | ICD-10-CM | POA: Diagnosis not present

## 2020-12-23 DIAGNOSIS — N183 Chronic kidney disease, stage 3 unspecified: Secondary | ICD-10-CM | POA: Diagnosis not present

## 2020-12-23 DIAGNOSIS — N179 Acute kidney failure, unspecified: Secondary | ICD-10-CM | POA: Diagnosis not present

## 2020-12-23 DIAGNOSIS — E1122 Type 2 diabetes mellitus with diabetic chronic kidney disease: Secondary | ICD-10-CM | POA: Diagnosis not present

## 2020-12-23 DIAGNOSIS — Z950 Presence of cardiac pacemaker: Secondary | ICD-10-CM | POA: Diagnosis not present

## 2020-12-24 DIAGNOSIS — N183 Chronic kidney disease, stage 3 unspecified: Secondary | ICD-10-CM | POA: Diagnosis not present

## 2020-12-24 DIAGNOSIS — Z950 Presence of cardiac pacemaker: Secondary | ICD-10-CM | POA: Diagnosis not present

## 2020-12-24 DIAGNOSIS — D61818 Other pancytopenia: Secondary | ICD-10-CM | POA: Diagnosis not present

## 2020-12-24 DIAGNOSIS — I255 Ischemic cardiomyopathy: Secondary | ICD-10-CM | POA: Diagnosis not present

## 2020-12-24 DIAGNOSIS — I2510 Atherosclerotic heart disease of native coronary artery without angina pectoris: Secondary | ICD-10-CM | POA: Diagnosis not present

## 2020-12-24 DIAGNOSIS — I130 Hypertensive heart and chronic kidney disease with heart failure and stage 1 through stage 4 chronic kidney disease, or unspecified chronic kidney disease: Secondary | ICD-10-CM | POA: Diagnosis not present

## 2020-12-24 DIAGNOSIS — I5023 Acute on chronic systolic (congestive) heart failure: Secondary | ICD-10-CM | POA: Diagnosis not present

## 2020-12-24 DIAGNOSIS — E1122 Type 2 diabetes mellitus with diabetic chronic kidney disease: Secondary | ICD-10-CM | POA: Diagnosis not present

## 2020-12-24 DIAGNOSIS — I5020 Unspecified systolic (congestive) heart failure: Secondary | ICD-10-CM | POA: Diagnosis not present

## 2020-12-24 DIAGNOSIS — N189 Chronic kidney disease, unspecified: Secondary | ICD-10-CM | POA: Diagnosis not present

## 2020-12-24 DIAGNOSIS — N179 Acute kidney failure, unspecified: Secondary | ICD-10-CM | POA: Diagnosis not present

## 2020-12-25 DIAGNOSIS — E1122 Type 2 diabetes mellitus with diabetic chronic kidney disease: Secondary | ICD-10-CM | POA: Diagnosis not present

## 2020-12-25 DIAGNOSIS — N183 Chronic kidney disease, stage 3 unspecified: Secondary | ICD-10-CM | POA: Diagnosis not present

## 2020-12-25 DIAGNOSIS — N189 Chronic kidney disease, unspecified: Secondary | ICD-10-CM | POA: Diagnosis not present

## 2020-12-25 DIAGNOSIS — I272 Pulmonary hypertension, unspecified: Secondary | ICD-10-CM | POA: Diagnosis not present

## 2020-12-25 DIAGNOSIS — D61818 Other pancytopenia: Secondary | ICD-10-CM | POA: Diagnosis not present

## 2020-12-25 DIAGNOSIS — I5023 Acute on chronic systolic (congestive) heart failure: Secondary | ICD-10-CM | POA: Diagnosis not present

## 2020-12-25 DIAGNOSIS — I5020 Unspecified systolic (congestive) heart failure: Secondary | ICD-10-CM | POA: Diagnosis not present

## 2020-12-25 DIAGNOSIS — I130 Hypertensive heart and chronic kidney disease with heart failure and stage 1 through stage 4 chronic kidney disease, or unspecified chronic kidney disease: Secondary | ICD-10-CM | POA: Diagnosis not present

## 2020-12-25 DIAGNOSIS — N179 Acute kidney failure, unspecified: Secondary | ICD-10-CM | POA: Diagnosis not present

## 2020-12-26 DIAGNOSIS — I5020 Unspecified systolic (congestive) heart failure: Secondary | ICD-10-CM | POA: Diagnosis not present

## 2020-12-26 DIAGNOSIS — I25810 Atherosclerosis of coronary artery bypass graft(s) without angina pectoris: Secondary | ICD-10-CM | POA: Diagnosis not present

## 2020-12-26 DIAGNOSIS — I5023 Acute on chronic systolic (congestive) heart failure: Secondary | ICD-10-CM | POA: Diagnosis not present

## 2020-12-26 DIAGNOSIS — I130 Hypertensive heart and chronic kidney disease with heart failure and stage 1 through stage 4 chronic kidney disease, or unspecified chronic kidney disease: Secondary | ICD-10-CM | POA: Diagnosis not present

## 2020-12-26 DIAGNOSIS — Z951 Presence of aortocoronary bypass graft: Secondary | ICD-10-CM | POA: Diagnosis not present

## 2020-12-26 DIAGNOSIS — I255 Ischemic cardiomyopathy: Secondary | ICD-10-CM | POA: Diagnosis not present

## 2020-12-26 DIAGNOSIS — N189 Chronic kidney disease, unspecified: Secondary | ICD-10-CM | POA: Diagnosis not present

## 2020-12-26 DIAGNOSIS — N179 Acute kidney failure, unspecified: Secondary | ICD-10-CM | POA: Diagnosis not present

## 2020-12-26 DIAGNOSIS — D61818 Other pancytopenia: Secondary | ICD-10-CM | POA: Diagnosis not present

## 2020-12-26 DIAGNOSIS — Z950 Presence of cardiac pacemaker: Secondary | ICD-10-CM | POA: Diagnosis not present

## 2020-12-26 DIAGNOSIS — N183 Chronic kidney disease, stage 3 unspecified: Secondary | ICD-10-CM | POA: Diagnosis not present

## 2020-12-26 DIAGNOSIS — E1122 Type 2 diabetes mellitus with diabetic chronic kidney disease: Secondary | ICD-10-CM | POA: Diagnosis not present

## 2020-12-27 DIAGNOSIS — E1122 Type 2 diabetes mellitus with diabetic chronic kidney disease: Secondary | ICD-10-CM | POA: Diagnosis not present

## 2020-12-27 DIAGNOSIS — N179 Acute kidney failure, unspecified: Secondary | ICD-10-CM | POA: Diagnosis not present

## 2020-12-27 DIAGNOSIS — N183 Chronic kidney disease, stage 3 unspecified: Secondary | ICD-10-CM | POA: Diagnosis not present

## 2020-12-27 DIAGNOSIS — I5023 Acute on chronic systolic (congestive) heart failure: Secondary | ICD-10-CM | POA: Diagnosis not present

## 2020-12-27 DIAGNOSIS — I130 Hypertensive heart and chronic kidney disease with heart failure and stage 1 through stage 4 chronic kidney disease, or unspecified chronic kidney disease: Secondary | ICD-10-CM | POA: Diagnosis not present

## 2020-12-27 DIAGNOSIS — I5020 Unspecified systolic (congestive) heart failure: Secondary | ICD-10-CM | POA: Diagnosis not present

## 2020-12-27 DIAGNOSIS — N189 Chronic kidney disease, unspecified: Secondary | ICD-10-CM | POA: Diagnosis not present

## 2021-03-26 ENCOUNTER — Telehealth: Payer: Self-pay | Admitting: Cardiology

## 2021-03-28 NOTE — Telephone Encounter (Signed)
Tammy with [facility] states the patient is declining rapidly and she would like to have his ICD deactivated. She would like a call back to further discuss this. Please return call to Tammy at [phone].

## 2021-03-28 NOTE — Telephone Encounter (Signed)
Returning phone call. Spoke to [place] and states patient has since passed. Offered condolences. Advised I will forward to Dr. Ileana Ladd to update. Thanked me for call.

## 2021-03-28 DEATH — deceased
